# Patient Record
Sex: Male | Born: 1950 | Race: Black or African American | Hispanic: No | Smoking: Current every day smoker
Health system: Southern US, Community
[De-identification: ages and names within clinical notes are randomized; demographics above are authoritative.]

## PROBLEM LIST (undated history)

## (undated) DIAGNOSIS — IMO0001 Reserved for inherently not codable concepts without codable children: Secondary | ICD-10-CM

## (undated) DIAGNOSIS — M549 Dorsalgia, unspecified: Secondary | ICD-10-CM

## (undated) DIAGNOSIS — F17200 Nicotine dependence, unspecified, uncomplicated: Secondary | ICD-10-CM

## (undated) DIAGNOSIS — F1110 Opioid abuse, uncomplicated: Secondary | ICD-10-CM

## (undated) DIAGNOSIS — T783XXA Angioneurotic edema, initial encounter: Secondary | ICD-10-CM

## (undated) DIAGNOSIS — I639 Cerebral infarction, unspecified: Secondary | ICD-10-CM

## (undated) DIAGNOSIS — R519 Headache, unspecified: Secondary | ICD-10-CM

## (undated) DIAGNOSIS — N390 Urinary tract infection, site not specified: Secondary | ICD-10-CM

## (undated) DIAGNOSIS — I10 Essential (primary) hypertension: Secondary | ICD-10-CM

## (undated) DIAGNOSIS — I509 Heart failure, unspecified: Secondary | ICD-10-CM

## (undated) DIAGNOSIS — R51 Headache: Secondary | ICD-10-CM

## (undated) DIAGNOSIS — D496 Neoplasm of unspecified behavior of brain: Secondary | ICD-10-CM

## (undated) DIAGNOSIS — R42 Dizziness and giddiness: Secondary | ICD-10-CM

## (undated) DIAGNOSIS — G459 Transient cerebral ischemic attack, unspecified: Secondary | ICD-10-CM

## (undated) DIAGNOSIS — G8929 Other chronic pain: Secondary | ICD-10-CM

## (undated) DIAGNOSIS — F1020 Alcohol dependence, uncomplicated: Secondary | ICD-10-CM

## (undated) HISTORY — PX: NO PAST SURGERIES: SHX2092

---

## 2000-11-18 ENCOUNTER — Emergency Department (HOSPITAL_COMMUNITY): Admission: EM | Admit: 2000-11-18 | Discharge: 2000-11-18 | Payer: Self-pay | Admitting: Emergency Medicine

## 2001-02-14 ENCOUNTER — Emergency Department (HOSPITAL_COMMUNITY): Admission: EM | Admit: 2001-02-14 | Discharge: 2001-02-14 | Payer: Self-pay | Admitting: Emergency Medicine

## 2002-07-27 ENCOUNTER — Emergency Department (HOSPITAL_COMMUNITY): Admission: EM | Admit: 2002-07-27 | Discharge: 2002-07-27 | Payer: Self-pay | Admitting: Emergency Medicine

## 2002-10-30 ENCOUNTER — Emergency Department (HOSPITAL_COMMUNITY): Admission: EM | Admit: 2002-10-30 | Discharge: 2002-10-30 | Payer: Self-pay | Admitting: Emergency Medicine

## 2003-02-22 ENCOUNTER — Encounter: Payer: Self-pay | Admitting: Emergency Medicine

## 2003-02-22 ENCOUNTER — Inpatient Hospital Stay (HOSPITAL_COMMUNITY): Admission: EM | Admit: 2003-02-22 | Discharge: 2003-02-24 | Payer: Self-pay | Admitting: Emergency Medicine

## 2003-02-22 ENCOUNTER — Encounter: Payer: Self-pay | Admitting: Neurology

## 2003-02-23 ENCOUNTER — Encounter: Payer: Self-pay | Admitting: Cardiology

## 2003-02-23 ENCOUNTER — Encounter: Payer: Self-pay | Admitting: Neurology

## 2003-02-24 ENCOUNTER — Encounter: Payer: Self-pay | Admitting: Neurology

## 2003-08-02 ENCOUNTER — Emergency Department (HOSPITAL_COMMUNITY): Admission: EM | Admit: 2003-08-02 | Discharge: 2003-08-02 | Payer: Self-pay | Admitting: Emergency Medicine

## 2003-08-02 ENCOUNTER — Encounter: Payer: Self-pay | Admitting: Emergency Medicine

## 2003-10-22 ENCOUNTER — Emergency Department (HOSPITAL_COMMUNITY): Admission: EM | Admit: 2003-10-22 | Discharge: 2003-10-22 | Payer: Self-pay | Admitting: Emergency Medicine

## 2003-10-22 ENCOUNTER — Encounter: Payer: Self-pay | Admitting: Emergency Medicine

## 2003-10-24 ENCOUNTER — Inpatient Hospital Stay (HOSPITAL_COMMUNITY): Admission: EM | Admit: 2003-10-24 | Discharge: 2003-10-28 | Payer: Self-pay | Admitting: Emergency Medicine

## 2003-11-30 ENCOUNTER — Encounter: Admission: RE | Admit: 2003-11-30 | Discharge: 2003-11-30 | Payer: Self-pay | Admitting: Gastroenterology

## 2004-04-14 ENCOUNTER — Emergency Department (HOSPITAL_COMMUNITY): Admission: EM | Admit: 2004-04-14 | Discharge: 2004-04-14 | Payer: Self-pay | Admitting: Emergency Medicine

## 2004-05-27 ENCOUNTER — Emergency Department (HOSPITAL_COMMUNITY): Admission: EM | Admit: 2004-05-27 | Discharge: 2004-05-28 | Payer: Self-pay | Admitting: Emergency Medicine

## 2005-01-04 ENCOUNTER — Emergency Department (HOSPITAL_COMMUNITY): Admission: EM | Admit: 2005-01-04 | Discharge: 2005-01-05 | Payer: Self-pay | Admitting: Emergency Medicine

## 2005-02-12 ENCOUNTER — Emergency Department (HOSPITAL_COMMUNITY): Admission: EM | Admit: 2005-02-12 | Discharge: 2005-02-12 | Payer: Self-pay | Admitting: Emergency Medicine

## 2006-10-24 ENCOUNTER — Emergency Department (HOSPITAL_COMMUNITY): Admission: EM | Admit: 2006-10-24 | Discharge: 2006-10-24 | Payer: Self-pay | Admitting: *Deleted

## 2007-07-20 ENCOUNTER — Emergency Department (HOSPITAL_COMMUNITY): Admission: EM | Admit: 2007-07-20 | Discharge: 2007-07-21 | Payer: Self-pay | Admitting: Emergency Medicine

## 2007-07-21 ENCOUNTER — Inpatient Hospital Stay (HOSPITAL_COMMUNITY): Admission: EM | Admit: 2007-07-21 | Discharge: 2007-07-24 | Payer: Self-pay | Admitting: Emergency Medicine

## 2007-08-03 ENCOUNTER — Ambulatory Visit: Payer: Self-pay | Admitting: Family Medicine

## 2007-08-03 LAB — CONVERTED CEMR LAB: Microalb, Ur: 1.94 mg/dL — ABNORMAL HIGH (ref 0.00–1.89)

## 2007-09-16 ENCOUNTER — Emergency Department (HOSPITAL_COMMUNITY): Admission: EM | Admit: 2007-09-16 | Discharge: 2007-09-16 | Payer: Self-pay | Admitting: Emergency Medicine

## 2007-10-16 ENCOUNTER — Emergency Department (HOSPITAL_COMMUNITY): Admission: EM | Admit: 2007-10-16 | Discharge: 2007-10-16 | Payer: Self-pay | Admitting: *Deleted

## 2007-10-21 ENCOUNTER — Ambulatory Visit: Payer: Self-pay | Admitting: Family Medicine

## 2007-10-21 LAB — CONVERTED CEMR LAB
ALT: 53 units/L (ref 0–53)
AST: 43 units/L — ABNORMAL HIGH (ref 0–37)
Alkaline Phosphatase: 71 units/L (ref 39–117)
Basophils Absolute: 0 10*3/uL (ref 0.0–0.1)
Basophils Relative: 0 % (ref 0–1)
Cholesterol: 161 mg/dL (ref 0–200)
Creatinine, Ser: 1.2 mg/dL (ref 0.40–1.50)
Eosinophils Absolute: 0.2 10*3/uL (ref 0.0–0.7)
Eosinophils Relative: 2 % (ref 0–5)
HCT: 44.9 % (ref 39.0–52.0)
Hemoglobin: 14.5 g/dL (ref 13.0–17.0)
MCHC: 32.3 g/dL (ref 30.0–36.0)
Monocytes Absolute: 0.7 10*3/uL (ref 0.2–0.7)
Platelets: 246 10*3/uL (ref 150–400)
RDW: 15.9 % — ABNORMAL HIGH (ref 11.5–14.0)
Sodium: 137 meq/L (ref 135–145)
Total Bilirubin: 0.5 mg/dL (ref 0.3–1.2)
Total Protein: 8.8 g/dL — ABNORMAL HIGH (ref 6.0–8.3)

## 2007-10-22 ENCOUNTER — Ambulatory Visit: Payer: Self-pay | Admitting: *Deleted

## 2007-11-14 ENCOUNTER — Emergency Department (HOSPITAL_COMMUNITY): Admission: EM | Admit: 2007-11-14 | Discharge: 2007-11-14 | Payer: Self-pay | Admitting: Emergency Medicine

## 2007-12-13 ENCOUNTER — Ambulatory Visit: Payer: Self-pay | Admitting: Family Medicine

## 2007-12-30 DIAGNOSIS — IMO0001 Reserved for inherently not codable concepts without codable children: Secondary | ICD-10-CM

## 2007-12-30 HISTORY — DX: Reserved for inherently not codable concepts without codable children: IMO0001

## 2008-01-28 ENCOUNTER — Ambulatory Visit: Payer: Self-pay | Admitting: Family Medicine

## 2008-02-24 ENCOUNTER — Ambulatory Visit: Payer: Self-pay | Admitting: Family Medicine

## 2008-02-24 LAB — CONVERTED CEMR LAB
HDL: 45 mg/dL (ref >39)
LDL Cholesterol: 104 mg/dL — ABNORMAL HIGH (ref 0–99)
Total CHOL/HDL Ratio: 3.9
Triglycerides: 135 mg/dL (ref <150)

## 2008-05-30 ENCOUNTER — Ambulatory Visit: Payer: Self-pay | Admitting: Internal Medicine

## 2008-09-29 ENCOUNTER — Ambulatory Visit: Payer: Self-pay | Admitting: Family Medicine

## 2008-09-30 ENCOUNTER — Encounter (INDEPENDENT_AMBULATORY_CARE_PROVIDER_SITE_OTHER): Payer: Self-pay | Admitting: Family Medicine

## 2008-11-19 ENCOUNTER — Inpatient Hospital Stay (HOSPITAL_COMMUNITY): Admission: EM | Admit: 2008-11-19 | Discharge: 2008-11-20 | Payer: Self-pay | Admitting: *Deleted

## 2008-11-20 ENCOUNTER — Ambulatory Visit: Payer: Self-pay | Admitting: Cardiology

## 2008-11-20 ENCOUNTER — Inpatient Hospital Stay (HOSPITAL_COMMUNITY): Admission: EM | Admit: 2008-11-20 | Discharge: 2008-11-22 | Payer: Self-pay | Admitting: Emergency Medicine

## 2008-11-21 ENCOUNTER — Encounter (INDEPENDENT_AMBULATORY_CARE_PROVIDER_SITE_OTHER): Payer: Self-pay | Admitting: Internal Medicine

## 2008-12-26 ENCOUNTER — Ambulatory Visit: Payer: Self-pay | Admitting: Family Medicine

## 2009-01-15 ENCOUNTER — Emergency Department (HOSPITAL_COMMUNITY): Admission: EM | Admit: 2009-01-15 | Discharge: 2009-01-15 | Payer: Self-pay | Admitting: Emergency Medicine

## 2009-01-19 ENCOUNTER — Emergency Department (HOSPITAL_COMMUNITY): Admission: EM | Admit: 2009-01-19 | Discharge: 2009-01-19 | Payer: Self-pay | Admitting: Emergency Medicine

## 2009-02-06 ENCOUNTER — Ambulatory Visit: Payer: Self-pay | Admitting: Family Medicine

## 2009-03-18 ENCOUNTER — Emergency Department (HOSPITAL_COMMUNITY): Admission: EM | Admit: 2009-03-18 | Discharge: 2009-03-19 | Payer: Self-pay | Admitting: Emergency Medicine

## 2009-04-04 ENCOUNTER — Emergency Department (HOSPITAL_COMMUNITY): Admission: EM | Admit: 2009-04-04 | Discharge: 2009-04-04 | Payer: Self-pay | Admitting: Emergency Medicine

## 2009-05-10 ENCOUNTER — Ambulatory Visit: Payer: Self-pay | Admitting: Family Medicine

## 2009-06-07 ENCOUNTER — Ambulatory Visit: Payer: Self-pay | Admitting: Family Medicine

## 2010-01-22 ENCOUNTER — Ambulatory Visit: Payer: Self-pay | Admitting: Family Medicine

## 2010-06-17 ENCOUNTER — Encounter (INDEPENDENT_AMBULATORY_CARE_PROVIDER_SITE_OTHER): Payer: Self-pay | Admitting: Internal Medicine

## 2010-06-17 ENCOUNTER — Inpatient Hospital Stay (HOSPITAL_COMMUNITY): Admission: EM | Admit: 2010-06-17 | Discharge: 2010-06-19 | Payer: Self-pay | Admitting: Emergency Medicine

## 2010-06-25 ENCOUNTER — Ambulatory Visit: Payer: Self-pay | Admitting: Family Medicine

## 2010-06-25 LAB — CONVERTED CEMR LAB: Sed Rate: 11 mm/hr (ref 0–16)

## 2010-07-23 ENCOUNTER — Ambulatory Visit: Payer: Self-pay | Admitting: Family Medicine

## 2010-10-08 ENCOUNTER — Ambulatory Visit: Payer: Self-pay | Admitting: Family Medicine

## 2010-11-10 ENCOUNTER — Inpatient Hospital Stay (HOSPITAL_COMMUNITY)
Admission: EM | Admit: 2010-11-10 | Discharge: 2010-11-13 | Payer: Self-pay | Source: Home / Self Care | Admitting: Emergency Medicine

## 2011-03-06 ENCOUNTER — Other Ambulatory Visit: Payer: Self-pay | Admitting: Neurosurgery

## 2011-03-06 DIAGNOSIS — D497 Neoplasm of unspecified behavior of endocrine glands and other parts of nervous system: Secondary | ICD-10-CM

## 2011-03-11 LAB — RAPID URINE DRUG SCREEN, HOSP PERFORMED
Amphetamines: NOT DETECTED
Barbiturates: NOT DETECTED
Cocaine: NOT DETECTED
Opiates: NOT DETECTED

## 2011-03-11 LAB — CBC
HCT: 39.3 % (ref 39.0–52.0)
Hemoglobin: 12.5 g/dL — ABNORMAL LOW (ref 13.0–17.0)
Hemoglobin: 13.9 g/dL (ref 13.0–17.0)
MCH: 24 pg — ABNORMAL LOW (ref 26.0–34.0)
MCH: 24.6 pg — ABNORMAL LOW (ref 26.0–34.0)
MCV: 73.3 fL — ABNORMAL LOW (ref 78.0–100.0)
MCV: 75.4 fL — ABNORMAL LOW (ref 78.0–100.0)
Platelets: 258 10*3/uL (ref 150–400)
RBC: 5.21 MIL/uL (ref 4.22–5.81)
RBC: 5.65 MIL/uL (ref 4.22–5.81)

## 2011-03-11 LAB — CK TOTAL AND CKMB (NOT AT ARMC)
CK, MB: 2.4 ng/mL (ref 0.3–4.0)
Total CK: 192 U/L (ref 7–232)

## 2011-03-11 LAB — COMPREHENSIVE METABOLIC PANEL
Alkaline Phosphatase: 58 U/L (ref 39–117)
BUN: 11 mg/dL (ref 6–23)
BUN: 11 mg/dL (ref 6–23)
CO2: 22 mEq/L (ref 19–32)
CO2: 24 mEq/L (ref 19–32)
Chloride: 105 mEq/L (ref 96–112)
Chloride: 105 mEq/L (ref 96–112)
Creatinine, Ser: 0.89 mg/dL (ref 0.4–1.5)
Creatinine, Ser: 1.03 mg/dL (ref 0.4–1.5)
GFR calc non Af Amer: >60 mL/min (ref >60)
GFR calc non Af Amer: >60 mL/min (ref >60)
Glucose, Bld: 158 mg/dL — ABNORMAL HIGH (ref 70–99)
Potassium: 4 mEq/L (ref 3.5–5.1)
Total Bilirubin: 0.2 mg/dL — ABNORMAL LOW (ref 0.3–1.2)
Total Bilirubin: 0.3 mg/dL (ref 0.3–1.2)

## 2011-03-11 LAB — FOLLICLE STIMULATING HORMONE: FSH: 2.9 m[IU]/mL (ref 1.4–18.1)

## 2011-03-11 LAB — GLUCOSE, CAPILLARY
Glucose-Capillary: 126 mg/dL — ABNORMAL HIGH (ref 70–99)
Glucose-Capillary: 127 mg/dL — ABNORMAL HIGH (ref 70–99)
Glucose-Capillary: 135 mg/dL — ABNORMAL HIGH (ref 70–99)
Glucose-Capillary: 140 mg/dL — ABNORMAL HIGH (ref 70–99)
Glucose-Capillary: 145 mg/dL — ABNORMAL HIGH (ref 70–99)
Glucose-Capillary: 145 mg/dL — ABNORMAL HIGH (ref 70–99)
Glucose-Capillary: 158 mg/dL — ABNORMAL HIGH (ref 70–99)
Glucose-Capillary: 208 mg/dL — ABNORMAL HIGH (ref 70–99)

## 2011-03-11 LAB — URINALYSIS, ROUTINE W REFLEX MICROSCOPIC
Hgb urine dipstick: NEGATIVE
Nitrite: NEGATIVE
Specific Gravity, Urine: 1.005 (ref 1.005–1.030)
Urobilinogen, UA: 0.2 mg/dL (ref 0.0–1.0)
pH: 5.5 (ref 5.0–8.0)

## 2011-03-11 LAB — GROWTH HORMONE: Growth Hormone: <0.1 ng/mL (ref 0.00–3.00)

## 2011-03-11 LAB — DIFFERENTIAL
Basophils Absolute: 0 10*3/uL (ref 0.0–0.1)
Eosinophils Absolute: 0.3 10*3/uL (ref 0.0–0.7)
Lymphocytes Relative: 35 % (ref 12–46)
Neutrophils Relative %: 58 % (ref 43–77)

## 2011-03-11 LAB — CARDIAC PANEL(CRET KIN+CKTOT+MB+TROPI)
Relative Index: 0.9 (ref 0.0–2.5)
Relative Index: 1.1 (ref 0.0–2.5)
Total CK: 267 U/L — ABNORMAL HIGH (ref 7–232)
Troponin I: 0.04 ng/mL (ref 0.00–0.06)

## 2011-03-11 LAB — LIPID PANEL
Cholesterol: 152 mg/dL (ref 0–200)
Total CHOL/HDL Ratio: 3.2 RATIO

## 2011-03-11 LAB — FERRITIN: Ferritin: 274 ng/mL (ref 22–322)

## 2011-03-11 LAB — PROTIME-INR: Prothrombin Time: 14.2 seconds (ref 11.6–15.2)

## 2011-03-11 LAB — PROLACTIN: Prolactin: 7.9 ng/mL (ref 2.1–17.1)

## 2011-03-11 LAB — TROPONIN I: Troponin I: 0.01 ng/mL (ref 0.00–0.06)

## 2011-03-11 LAB — SICKLE CELL SCREEN: Sickle Cell Screen: NEGATIVE

## 2011-03-11 LAB — LACTIC ACID, PLASMA: Lactic Acid, Venous: 2 mmol/L (ref 0.5–2.2)

## 2011-03-11 LAB — TSH: TSH: 0.385 u[IU]/mL (ref 0.350–4.500)

## 2011-03-11 LAB — ACTH: C206 ACTH: 28 pg/mL (ref 10–46)

## 2011-03-13 ENCOUNTER — Other Ambulatory Visit: Payer: Self-pay

## 2011-03-16 LAB — RAPID URINE DRUG SCREEN, HOSP PERFORMED
Cocaine: NOT DETECTED
Opiates: POSITIVE — AB
Tetrahydrocannabinol: NOT DETECTED

## 2011-03-16 LAB — PROTEIN ELECTROPH W RFLX QUANT IMMUNOGLOBULINS
Alpha-1-Globulin: 5 % — ABNORMAL HIGH (ref 2.9–4.9)
Alpha-2-Globulin: 11.1 % (ref 7.1–11.8)
Gamma Globulin: 18 % (ref 11.1–18.8)
Total Protein ELP: 7.4 g/dL (ref 6.0–8.3)

## 2011-03-16 LAB — C-REACTIVE PROTEIN: CRP: 0 mg/dL — ABNORMAL LOW (ref <0.6)

## 2011-03-16 LAB — RPR: RPR Ser Ql: NONREACTIVE

## 2011-03-16 LAB — COMPREHENSIVE METABOLIC PANEL
Alkaline Phosphatase: 65 U/L (ref 39–117)
BUN: 8 mg/dL (ref 6–23)
CO2: 24 mEq/L (ref 19–32)
Chloride: 108 mEq/L (ref 96–112)
GFR calc non Af Amer: >60 mL/min (ref >60)
Glucose, Bld: 182 mg/dL — ABNORMAL HIGH (ref 70–99)
Potassium: 4.3 mEq/L (ref 3.5–5.1)
Total Bilirubin: 0.4 mg/dL (ref 0.3–1.2)

## 2011-03-16 LAB — URINALYSIS, ROUTINE W REFLEX MICROSCOPIC
Bilirubin Urine: NEGATIVE
Bilirubin Urine: NEGATIVE
Glucose, UA: NEGATIVE mg/dL
Hgb urine dipstick: NEGATIVE
Nitrite: POSITIVE — AB
Protein, ur: NEGATIVE mg/dL
Specific Gravity, Urine: 1.016 (ref 1.005–1.030)
Urobilinogen, UA: 1 mg/dL (ref 0.0–1.0)
Urobilinogen, UA: 1 mg/dL (ref 0.0–1.0)
pH: 6 (ref 5.0–8.0)

## 2011-03-16 LAB — GLUCOSE, CAPILLARY
Glucose-Capillary: 144 mg/dL — ABNORMAL HIGH (ref 70–99)
Glucose-Capillary: 149 mg/dL — ABNORMAL HIGH (ref 70–99)
Glucose-Capillary: 175 mg/dL — ABNORMAL HIGH (ref 70–99)
Glucose-Capillary: 216 mg/dL — ABNORMAL HIGH (ref 70–99)

## 2011-03-16 LAB — CBC
HCT: 43.5 % (ref 39.0–52.0)
HCT: 45.4 % (ref 39.0–52.0)
Hemoglobin: 14.7 g/dL (ref 13.0–17.0)
MCHC: 32.5 g/dL (ref 30.0–36.0)
MCV: 77.2 fL — ABNORMAL LOW (ref 78.0–100.0)
RBC: 5.64 MIL/uL (ref 4.22–5.81)
RDW: 14 % (ref 11.5–15.5)
WBC: 9 10*3/uL (ref 4.0–10.5)

## 2011-03-16 LAB — LIPID PANEL
Cholesterol: 140 mg/dL (ref 0–200)
HDL: 35 mg/dL — ABNORMAL LOW (ref >39)
Total CHOL/HDL Ratio: 4 RATIO
Triglycerides: 199 mg/dL — ABNORMAL HIGH (ref <150)

## 2011-03-16 LAB — HEMOGLOBIN A1C: Hgb A1c MFr Bld: 7.4 % — ABNORMAL HIGH (ref <5.7)

## 2011-03-16 LAB — TSH: TSH: 0.683 u[IU]/mL (ref 0.350–4.500)

## 2011-03-16 LAB — URINE MICROSCOPIC-ADD ON

## 2011-03-16 LAB — URINE CULTURE

## 2011-03-16 LAB — DIFFERENTIAL
Basophils Absolute: 0.1 10*3/uL (ref 0.0–0.1)
Basophils Relative: 1 % (ref 0–1)
Eosinophils Absolute: 0.3 10*3/uL (ref 0.0–0.7)
Eosinophils Relative: 3 % (ref 0–5)
Lymphs Abs: 4 10*3/uL (ref 0.7–4.0)
Monocytes Relative: 7 % (ref 3–12)
Neutro Abs: 9 10*3/uL — ABNORMAL HIGH (ref 1.7–7.7)
Neutrophils Relative %: 65 % (ref 43–77)

## 2011-03-16 LAB — VITAMIN B12: Vitamin B-12: 764 pg/mL (ref 211–911)

## 2011-03-16 LAB — IGG, IGA, IGM: IgG (Immunoglobin G), Serum: 1340 mg/dL (ref 694–1618)

## 2011-03-16 LAB — ANA: Anti Nuclear Antibody(ANA): NEGATIVE

## 2011-03-16 LAB — APTT: aPTT: 32 seconds (ref 24–37)

## 2011-03-16 LAB — FOLATE RBC: RBC Folate: 512 ng/mL (ref 180–600)

## 2011-03-16 LAB — HEPATITIS PANEL, ACUTE: HCV Ab: REACTIVE — AB

## 2011-03-16 LAB — PROTIME-INR: INR: 1.04 (ref 0.00–1.49)

## 2011-03-16 LAB — CK TOTAL AND CKMB (NOT AT ARMC): CK, MB: 1.3 ng/mL (ref 0.3–4.0)

## 2011-03-25 ENCOUNTER — Ambulatory Visit
Admission: RE | Admit: 2011-03-25 | Discharge: 2011-03-25 | Disposition: A | Discharge status: Home / Self Care | Payer: Medicaid Other | Source: Ambulatory Visit | Attending: Neurosurgery | Admitting: Neurosurgery

## 2011-03-25 DIAGNOSIS — D497 Neoplasm of unspecified behavior of endocrine glands and other parts of nervous system: Secondary | ICD-10-CM

## 2011-03-25 MED ORDER — GADOBENATE DIMEGLUMINE 529 MG/ML IV SOLN
10.0000 mL | Freq: Once | INTRAVENOUS | Status: AC | PRN
  Administered 2011-03-25: 10 mL via INTRAVENOUS

## 2011-04-07 ENCOUNTER — Emergency Department (HOSPITAL_COMMUNITY)
Admission: EM | Admit: 2011-04-07 | Discharge: 2011-04-07 | Disposition: A | Discharge status: Home / Self Care | Payer: Medicaid Other | Attending: Emergency Medicine | Admitting: Emergency Medicine

## 2011-04-07 DIAGNOSIS — E119 Type 2 diabetes mellitus without complications: Secondary | ICD-10-CM | POA: Insufficient documentation

## 2011-04-07 DIAGNOSIS — J3489 Other specified disorders of nose and nasal sinuses: Secondary | ICD-10-CM | POA: Insufficient documentation

## 2011-04-07 DIAGNOSIS — Z79899 Other long term (current) drug therapy: Secondary | ICD-10-CM | POA: Insufficient documentation

## 2011-04-07 DIAGNOSIS — R51 Headache: Secondary | ICD-10-CM | POA: Insufficient documentation

## 2011-04-07 DIAGNOSIS — Z8673 Personal history of transient ischemic attack (TIA), and cerebral infarction without residual deficits: Secondary | ICD-10-CM | POA: Insufficient documentation

## 2011-04-07 DIAGNOSIS — J329 Chronic sinusitis, unspecified: Secondary | ICD-10-CM | POA: Insufficient documentation

## 2011-04-07 DIAGNOSIS — I10 Essential (primary) hypertension: Secondary | ICD-10-CM | POA: Insufficient documentation

## 2011-04-07 DIAGNOSIS — H538 Other visual disturbances: Secondary | ICD-10-CM | POA: Insufficient documentation

## 2011-04-09 LAB — POCT I-STAT, CHEM 8
Creatinine, Ser: 1 mg/dL (ref 0.4–1.5)
Glucose, Bld: 123 mg/dL — ABNORMAL HIGH (ref 70–99)
Hemoglobin: 16.3 g/dL (ref 13.0–17.0)
TCO2: 21 mmol/L (ref 0–100)

## 2011-04-10 ENCOUNTER — Other Ambulatory Visit: Payer: Self-pay | Admitting: Neurosurgery

## 2011-04-10 DIAGNOSIS — R911 Solitary pulmonary nodule: Secondary | ICD-10-CM

## 2011-04-10 LAB — POCT I-STAT, CHEM 8
Calcium, Ion: 1.09 mmol/L — ABNORMAL LOW (ref 1.12–1.32)
Chloride: 112 mEq/L (ref 96–112)
Glucose, Bld: 117 mg/dL — ABNORMAL HIGH (ref 70–99)
HCT: 47 % (ref 39.0–52.0)
Hemoglobin: 16 g/dL (ref 13.0–17.0)

## 2011-04-10 LAB — POCT CARDIAC MARKERS
CKMB, poc: <1 ng/mL — ABNORMAL LOW (ref 1.0–8.0)
Myoglobin, poc: 41.8 ng/mL (ref 12–200)
Troponin i, poc: <0.05 ng/mL (ref 0.00–0.09)

## 2011-04-14 ENCOUNTER — Other Ambulatory Visit: Payer: Medicaid Other

## 2011-04-14 LAB — GLUCOSE, CAPILLARY: Glucose-Capillary: 116 mg/dL — ABNORMAL HIGH (ref 70–99)

## 2011-04-14 LAB — POCT I-STAT, CHEM 8
Hemoglobin: 16.3 g/dL (ref 13.0–17.0)
Sodium: 142 mEq/L (ref 135–145)
TCO2: 22 mmol/L (ref 0–100)

## 2011-04-14 LAB — ETHANOL: Alcohol, Ethyl (B): 11 mg/dL — ABNORMAL HIGH (ref 0–10)

## 2011-04-16 ENCOUNTER — Ambulatory Visit
Admission: RE | Admit: 2011-04-16 | Discharge: 2011-04-16 | Disposition: A | Discharge status: Home / Self Care | Payer: Medicaid Other | Source: Ambulatory Visit | Attending: Neurosurgery | Admitting: Neurosurgery

## 2011-04-16 DIAGNOSIS — R911 Solitary pulmonary nodule: Secondary | ICD-10-CM

## 2011-04-16 MED ORDER — IOHEXOL 300 MG/ML  SOLN
75.0000 mL | Freq: Once | INTRAMUSCULAR | Status: AC | PRN
  Administered 2011-04-16: 75 mL via INTRAVENOUS

## 2011-05-01 ENCOUNTER — Other Ambulatory Visit: Payer: Self-pay | Admitting: Neurosurgery

## 2011-05-01 DIAGNOSIS — Z86018 Personal history of other benign neoplasm: Secondary | ICD-10-CM

## 2011-05-08 ENCOUNTER — Emergency Department (HOSPITAL_COMMUNITY): Payer: Medicaid Other

## 2011-05-08 ENCOUNTER — Emergency Department (HOSPITAL_COMMUNITY)
Admission: EM | Admit: 2011-05-08 | Discharge: 2011-05-08 | Disposition: A | Discharge status: Home / Self Care | Payer: Medicaid Other | Attending: Emergency Medicine | Admitting: Emergency Medicine

## 2011-05-08 DIAGNOSIS — Z8673 Personal history of transient ischemic attack (TIA), and cerebral infarction without residual deficits: Secondary | ICD-10-CM | POA: Insufficient documentation

## 2011-05-08 DIAGNOSIS — I10 Essential (primary) hypertension: Secondary | ICD-10-CM | POA: Insufficient documentation

## 2011-05-08 DIAGNOSIS — E119 Type 2 diabetes mellitus without complications: Secondary | ICD-10-CM | POA: Insufficient documentation

## 2011-05-08 DIAGNOSIS — R509 Fever, unspecified: Secondary | ICD-10-CM | POA: Insufficient documentation

## 2011-05-08 DIAGNOSIS — B9789 Other viral agents as the cause of diseases classified elsewhere: Secondary | ICD-10-CM | POA: Insufficient documentation

## 2011-05-08 DIAGNOSIS — N342 Other urethritis: Secondary | ICD-10-CM | POA: Insufficient documentation

## 2011-05-08 LAB — DIFFERENTIAL
Basophils Absolute: 0 10*3/uL (ref 0.0–0.1)
Lymphocytes Relative: 9 % — ABNORMAL LOW (ref 12–46)
Monocytes Absolute: 0 10*3/uL — ABNORMAL LOW (ref 0.1–1.0)
Monocytes Relative: 0 % — ABNORMAL LOW (ref 3–12)
Neutro Abs: 10.4 10*3/uL — ABNORMAL HIGH (ref 1.7–7.7)
Neutrophils Relative %: 90 % — ABNORMAL HIGH (ref 43–77)

## 2011-05-08 LAB — COMPREHENSIVE METABOLIC PANEL
ALT: 55 U/L — ABNORMAL HIGH (ref 0–53)
AST: 74 U/L — ABNORMAL HIGH (ref 0–37)
Alkaline Phosphatase: 53 U/L (ref 39–117)
CO2: 21 mEq/L (ref 19–32)
Calcium: 9.3 mg/dL (ref 8.4–10.5)
Chloride: 99 mEq/L (ref 96–112)
GFR calc non Af Amer: >60 mL/min (ref >60)
Glucose, Bld: 109 mg/dL — ABNORMAL HIGH (ref 70–99)
Sodium: 132 mEq/L — ABNORMAL LOW (ref 135–145)
Total Bilirubin: 0.4 mg/dL (ref 0.3–1.2)

## 2011-05-08 LAB — CBC
HCT: 44.2 % (ref 39.0–52.0)
Hemoglobin: 14.2 g/dL (ref 13.0–17.0)
MCHC: 32.1 g/dL (ref 30.0–36.0)
RBC: 5.84 MIL/uL — ABNORMAL HIGH (ref 4.22–5.81)

## 2011-05-08 LAB — URINALYSIS, ROUTINE W REFLEX MICROSCOPIC
Bilirubin Urine: NEGATIVE
Glucose, UA: NEGATIVE mg/dL
Hgb urine dipstick: NEGATIVE
Protein, ur: NEGATIVE mg/dL
Urobilinogen, UA: 1 mg/dL (ref 0.0–1.0)

## 2011-05-08 LAB — LIPASE, BLOOD: Lipase: 32 U/L (ref 11–59)

## 2011-05-08 LAB — URINE MICROSCOPIC-ADD ON

## 2011-05-09 LAB — URINE CULTURE: Colony Count: >=100000

## 2011-05-13 NOTE — H&P (Signed)
[name], [name]               ACCOUNT NO.:  [id number]   MEDICAL RECORD NO.:  [id number]          PATIENT TYPE:  INP   LOCATION:  3731                         FACILITY:  [facility]   PHYSICIAN:  Wilson Singer, M.D.DATE OF BIRTH:  [birth year]   DATE OF ADMISSION:  11/19/2008  DATE OF DISCHARGE:                              HISTORY & PHYSICAL   This 60 year old man describes an approximately 3-day history of a sore  throat associated with a cough, which is largely nonproductive and the  cough is associated with a sharp anterior upper chest pain.  The pain  does not particularly radiate significantly.  He has not had any  significant wheezing.  There is no fever.  He came into the emergency  room today because of these symptoms and the worry is whether the chest  pain is of any significance.  He has had workup so far, which includes a  CT angiogram to rule out a pulmonary embolism.  There is no pulmonary  embolism, but comment is made on coronary arteries which are calcified.   PAST MEDICAL HISTORY:  Significant for diabetes and hypertension.   There is no history of major surgeries.   ALLERGIES:  No known drug allergies.   CURRENT MEDICATIONS:  1. Metformin 500 mg daily.  2. Aspirin 1 tablet daily.  3. Antihypertensive medication, the name of which he is not aware of.   FAMILY HISTORY:  He told that his father died of a myocardial infarction  in his 69s.   REVIEW OF SYSTEMS:  Apart from the symptoms mentioned above, there are  no other symptoms referable to all systems reviewed.   PHYSICAL EXAMINATION:  VITAL SIGNS:  Temperature 98.1, blood pressure  119/64, pulse 86, saturation 98% on room air, and respiratory rate 12-  14.  GENERAL:  The patient does not appear to be in any discomfort.  CARDIOVASCULAR:  Heart sounds are present and normal without any gallop  rhythm.  There are no rubs.  Jugular venous pressure is not raised.  There are no carotid bruits.  RESPIRATORY:   Lungs fields are entirely clear.  CHEST WALL:  There is tenderness in the anterior chest wall reproducing  the pain that he describes.  ABDOMEN:  Soft and nontender with no hepatosplenomegaly.  NEUROLOGIC:  He is alert and oriented without any focal neurological  signs.   INVESTIGATIONS:  Hemoglobin 13.9, white blood cell count 13.0, and  platelets 252.  Initial troponin is less than 0.05.  Sodium 137,  potassium 4.2, bicarbonate 21, glucose 108, BUN 21, and creatinine 1.05.  Lipase 27, AST and ALT are moderately elevated, and albumin 3.9.  Chest  x-ray shows low lung volumes with mid mild left base airspace disease,  which is favorable for atelectasis.  There is bronchial thickening,  which may relate to chronic bronchitis.  CT angio of the chest, as  mentioned above with evidence of calcified coronary arteries, but no  pulmonary embolism.   IMPRESSION:  1. Bronchitis.  2. Atypical sharp chest pain which is likely musculoskeletal in      nature.  3.  Significant risk factors for coronary artery disease.  4. Hypertension.  5. Type 2 diabetes mellitus.  6. Tobacco abuse in a previous history from e-chart records of cocaine      abuse.   PLAN:  1. Admit to telemetry.  2. Antibiotics and steroids for bronchitis.  3. Serial cardiac enzymes and electrocardiograms.  4. Urine drug screen  5. Consider Cardiology consultation for an outpatient stress test if      the enzymes are negative.   Further recommendations will depend on the patient's hospital progress.      Wilson Singer, M.D.  Electronically Signed     NCG/MEDQ  D:  11/19/2008  T:  11/19/2008  Job:  [id number]

## 2011-05-13 NOTE — H&P (Signed)
NAMEKARAS, PICKERILL NO.:  [id number]   MEDICAL RECORD NO.:  [id number]          PATIENT TYPE:  INP   LOCATION:  1844                         FACILITY:  [facility]   PHYSICIAN:  Lucita Ferrara, MD         DATE OF BIRTH:  [birth year]   DATE OF ADMISSION:  11/20/2008  DATE OF DISCHARGE:                              HISTORY & PHYSICAL   CHIEF COMPLAINT:  Chest pain that is recurrent.   HISTORY OF PRESENT ILLNESS:  The patient is a 60 year old African  American male who now presents back to the hospital 1 day prior to  signing out against medical advice from [facility].  The  patient was initially admitted November 19, 2008 with atypical type of  chest pain and presumptive bronchitis.  He had a set of cardiac enzymes  that were essentially negative.  Chest pain is much similar to prior  presentation, located anterior upper chest area, sharp in description  and 10/10 intensity not associated with exertion.  There is no  association with gastroesophageal reflux disease or p.o. intake.  There  is no wheezing, fevers, chills or sick contacts.  The patient apparently  had a CT angiography to rule out pulmonary embolism which was apparently  negative at that time except showed some calcifications.   PAST MEDICAL HISTORY AND RISK FACTORS:  Include:  1. Diabetes.  2. Hypertension.  3. Recurrent chest pain.  4. No major surgery.   ALLERGIES:  No known drug allergies.   CURRENT MEDICATIONS:  1. Metformin 500 mg p.o. daily.  2. Aspirin 1 tablet p.o. daily.  3. Antihypertensive medication which he does not remember.   FAMILY HISTORY:  Father died of a myocardial infarction at age 87.   REVIEW OF SYSTEMS:  As per HPI, otherwise negative.   PHYSICAL EXAMINATION:  Generally speaking, the patient is in no acute  distress.  Blood pressure is 159/84, pulse 89, respirations 18,  temperature 98.4, pulse ox 96% on room air.  HEENT: Normocephalic, atraumatic.  Sclerae is  anicteric.  NECK:  Is supple.  No JVD, no carotid bruits.  Extraocular muscles  intact.  CARDIOVASCULAR:  S1, S2.  Regular rate, rhythm.  No murmurs, rubs,  clicks.  LUNGS:  Clear to auscultation bilaterally with no rhonchi, rales or  wheezes.  There is some tenderness left anterior chest area that is  reproducible.  ABDOMEN:  Soft, nontender, nondistended.  Positive bowel  sounds.  NEURO:  Patient is oriented x3.  Cranial nerves II-XII grossly intact.   LABORATORY DATA:  Cardiac markers x2 essentially negative except with  high myoglobin of greater than 500 on the second set.  Lipid profile  shows a high triglyceride level but a normal LDL level.  CBC shows a  white count of 24.5 and a hemoglobin of 14.1, hematocrit 42.6 and  platelet count of 249.  I-stat within normal limits.   RADIOLOGICAL DATA:  Chest x-ray shows mild peribronchial thickening, no  pneumonia or effusion.   ASSESSMENT/PLAN:  [age]-year-old with atypical type of chest pain,  associated with cough and  pleuritis, sharp in nature and reproducible.  Worse upon inspiration.   PLAN:  1. Even-though the patient's atypical symptoms, the patient will be      admitted to the medical telemetry unit.  He does have moderate risk      factors due to early myocardial infarction in father at a young      age.  2. Leukocytosis. Plan would be to get a set of blood cultures x2,      sputum culture and empirically start the patient on Levaquin.  3. Diabetes type 2.  We will check hemoglobin A1c, initiate sliding      scale insulin and continue his metformin.  4. Moderately controlled hypertension.  W will continue lisinopril and      add low-dose beta blocker with metoprolol 12.5 mg twice daily.  We      will check a urine drug screen for cocaine and if it is positive,      we will need to discontinue the metoprolol.  5. Noncompliance with medical care signing out against medical advice      from the hospital.  6. Continued  tobacco abuse.  7. Isolated hypertriglyceridemia.  We will initiate nicotine patch,      literature on smoking cessation.  We will assert the need to quit.      We will continue risk factor modification.  We will assert the      importance of staying in the hospital until finalized workup and      Cardiology consultation have been made.  I do believe that the      patient has moderately high risk factor and will likely benefit      from a stress test prior to leaving the hospital.  As far as      leukocytosis, this needs to be worked up.  It does not look like      patient has been on any steroids recently, although we will get the      previous chart to rule that out.      Lucita Ferrara, MD  Electronically Signed     RR/MEDQ  D:  11/20/2008  T:  11/20/2008  Job:  [id number]

## 2011-05-13 NOTE — H&P (Signed)
[name], [name]               ACCOUNT NO.:  [id number]   MEDICAL RECORD NO.:  [id number]          PATIENT TYPE:  INP   LOCATION:  1443                         FACILITY:  [facility]   PHYSICIAN:  Beckey Rutter, MD  DATE OF BIRTH:  08/10/1952   DATE OF ADMISSION:  07/21/2007  DATE OF DISCHARGE:                              HISTORY & PHYSICAL   PRIMARY CARE PHYSICIAN:  The patient is unassigned to [facility].   CHIEF COMPLAINT:  Hyperglycemia and decreased level of consciousness.   HISTORY OF PRESENT ILLNESS:  This is a 60 year old African American male  with past medical history significant for the gastrointestinal bleeding  secondary to diverticulitis.  He was in his usual state of health up to  yesterday when he felt overall tired and felt generalized pain.  He  visited the ER where they discovered the patient had hyperglycemia, and  he was started on anti-diabetic medication to follow up with his  primary.  The patient went home, and since last night, his condition was  not improving.  He stated that he did not take the medication prescribed  to him by the ER.  Now he is coming back with abdominal discomfort and  overall being too tired to answer or engage in conversation.  When I  asked him, while taking history, he stated that he has abdominal  discomfort.  He did not get to eat anything since yesterday because of  his overall condition of being tired and with the mild abdominal  discomfort.   PAST MEDICAL HISTORY:  Significant for:  1. GI bleeding secondary to colonic diverticulitis in 07/2003.  2. Newly diagnosed diabetes.   DRUG ALLERGIES:  HE IS NOT KNOWN TO HAVE DRUG ALLERGIES.   MEDICATIONS:  He is not taking any medications, though yesterday he was  prescribed medication for diabetes.   SOCIAL HISTORY:  He is a current smoker, occasional drinker.  He has a  history of cocaine abuse in the past but denied cocaine abuse or drug  abuse recently.  He lives now with  his girlfriend.   REVIEW OF SYSTEMS:  As per his girlfriend who is at bedside.  The  patient was complaining of chills and was complaining of greenish sputum  with no significant cough.   PHYSICAL EXAMINATION:  VITALS:  Temperature 98.7, blood pressure 150/69,  pulse 78, respiratory rate 20.  GENERAL:  He was lying in bed.  He looks ill, but there is no  respiratory distress.  HEAD:  Atraumatic, normocephalic.  EYES:  PERRL.  MOUTH:  No ulcer, dry lips.  NECK:  Supple.  No JVD.  LUNGS:  He has right middle lobe crepitation on auscultation.  PRECORDIAL:  First and second heart sounds audible, no added sounds.  ABDOMEN:  Soft, nontender.  Bowel sounds present.  GU:  No CVA tenderness.  NEUROLOGICALLY:  Patient answered questions appropriately, but he seems  to seek to be fluent; most of the time need to repeat the question  twice.  He seems to be moving all his extremities spontaneously.   LABS AND X-RAY:  Urine for  tricyclic is negative.  Lactic acid is 1.3.  Drug screen is positive only for opiates.  Aspirin is less than 4.  Alcohol was less than five.  Acetaminophen in the blood is less than 10.  D-dimer is not elevated at 0.47.  Ketones are negative.  His sodium is  137, potassium 4.6, chloride 101, bicarb 22, glucose 327, BUN 113,  creatinine 1.02.  White blood count elevated at 13.3 with 80%  neutrophils.  Hemoglobin 14.9, hematocrit 46.3, platelet is 222.  Microscopic urine showing white blood count by high-power field 7-10.  Urinalysis showing negative nitrates and a small amount of leukocyte  esterase.  His ABG showing pH of 7.43, pCO2 of 33.4, pO2 is 54.4.  Chest  x-ray reading is low lung volumes with bibasilar atelectasis and mild  chronic bronchitic change.  EKG is sinus at rate of 80.   ASSESSMENT AND PLAN:  This is a 60 year old male with uncontrolled  diabetes who presented today with generalized pain, fevers and cough;  also was found to have elevated white blood  count in the urine, an  indication of UTI.   The patient will be admitted for further assessment and management.  I  will start the patient on antibiotics to cover the UTI as well as the  lung finding which could be a bronchitic change/bronchitis picture  versus pneumonia.  We will obtain a good chest x-ray with PA and lateral  views.  The patient will be hydrated with IV fluids.  He has mild  tenderness on his abdominal exam and also with mild rebound tenderness.  I will consider CT abdomen today.  For DVT prophylaxis, I will consider  Lovenox for GI prophylaxis consider Protonix IV for now to be switched  to p.o. in the morning if the patient started to tolerate food.      Beckey Rutter, MD  Electronically Signed     EME/MEDQ  D:  07/21/2007  T:  07/22/2007  Job:  [id number]

## 2011-05-13 NOTE — Discharge Summary (Signed)
[name], [name] NO.:  [id number]   MEDICAL RECORD NO.:  [id number]          PATIENT TYPE:  INP   LOCATION:  3706                         FACILITY:  [facility]   PHYSICIAN:  Peggye Pitt, M.D. DATE OF BIRTH:  04-21-1951   DATE OF ADMISSION:  11/20/2008  DATE OF DISCHARGE:  11/22/2008                               DISCHARGE SUMMARY   DISCHARGE DIAGNOSES:  1. Chest pain, rule out for acute myocardial infarction.  2. Hypertension.  3. Type 2 diabetes.  4. Tobacco abuse.   DISCHARGE MEDICATIONS:  1. Metformin 500 mg p.o. daily.  2. Lisinopril 5 mg daily.  3. Aspirin 81 mg daily.   DISPOSITION AND FOLLOWUP:  The patient is discharged home in stable  condition.  He is no longer experiencing any chest pain.  He has been  advised to make an appointment at [facility] within 2 weeks of this  discharge, for follow up of his regular medical problems.   CONSULTATIONS:  Consultations during this hospitalization include  [facility] for a Myoview stress test.   Images during this hospitalization include a chest x-ray on November 20, 2008, that showed some mild peribronchial thickening with no pneumonia  or effusion; a CT angiogram of his chest that showed no pulmonary  embolus with no acute disease in the chest with coronary artery disease  and atherosclerosis; a Myoview on November 22, 2008, that showed an  ejection fraction of 71% and was negative for exercise stress-induced  ischemia.   HISTORY AND PHYSICAL:  For full details, please refer to history and  physical dictated by Dr. Flonnie Overman on date November 20, 2008, but in brief,  [name] is a 60 year old African-American man who presented with  chest pain.  He had left against medical advice the day before for same  reasons and came back because of his continued pain.  Chest pain was  located in the upper chest area, not associated with exertion.  We were  called for admission for further evaluation of  his chest pain.   HOSPITAL COURSE BY ACTIVE PROBLEMS:  1. Chest pain:  Because of his risk factors and coronary      atherosclerosis seen on the CT scan of his chest, we asked      Cardiology to perform a nuclear medicine stress test, which was      negative for exercise-induced ischemia with a good ejection      fraction of 71%.  He ruled out for an MI with 3 sets of negative      enzymes.  2. Hypertension:  He maintained excellent blood pressure control in      the hospital while on lisinopril.  3. For his type 2 diabetes, good CBG control while on metformin.  4. Tobacco abuse:  He was maintained on a nicotine patch while in the      hospital.  We have encouraged smoking cessation, but he has denied      a prescription for nicotine before his discharge.   VITAL SIGNS UPON DISCHARGE:  Blood pressure 129/83, heart rate 75,  respirations 20, O2  sats 95% on room air with a temperature of 98.7.   LABORATORY DATA ON DAY OF DISCHARGE:  Sodium 136, potassium 3.7,  chloride 104, bicarb 24, BUN 13, creatinine 0.96 with a glucose of 117.  WBCs 13.7, hemoglobin 13.1, platelets of 219.  His troponins have been  negative x3.      Peggye Pitt, M.D.  Electronically Signed     EH/MEDQ  D:  11/22/2008  T:  11/23/2008  Job:  [id number]   cc:   Dr. Reche Dixon

## 2011-05-13 NOTE — Discharge Summary (Signed)
NAMEOZZIE, KNOBEL NO.:  [id number]   MEDICAL RECORD NO.:  [id number]          PATIENT TYPE:  INP   LOCATION:  3738                         FACILITY:  [facility]   PHYSICIAN:  Theodosia Paling, MD    DATE OF BIRTH:  [birth year]   DATE OF ADMISSION:  11/19/2008  DATE OF DISCHARGE:  11/20/2008                               DISCHARGE SUMMARY   Please refer to the excellent admission note dictated by Dr. Lilly Cove on November 19, 2008.   HOSPITAL COURSE:  The patient was admitted to rule out MI, however, the  patient left against medical advice before I could evaluate him on  November 20, 2008.  By the time I reached, the patient already left the  floor, so was not evaluated by me.      Theodosia Paling, MD  Electronically Signed     Theodosia Paling, MD  Electronically Signed    NP/MEDQ  D:  12/25/2008  T:  12/26/2008  Job:  [id number]

## 2011-05-13 NOTE — Discharge Summary (Signed)
[name], [name]               ACCOUNT NO.:  [id number]   MEDICAL RECORD NO.:  [id number]          PATIENT TYPE:  INP   LOCATION:  1443                         FACILITY:  [facility]   PHYSICIAN:  Beckey Rutter, MD  DATE OF BIRTH:  08/10/1952   DATE OF ADMISSION:  07/21/2007  DATE OF DISCHARGE:  07/24/2007                               DISCHARGE SUMMARY   PRIMARY CARE PHYSICIAN:  The patient belongs to [facility].  He is  unassigned to [facility].   CHIEF COMPLIANT ON PRESENTATION:  Abdominal pain, nausea and vomiting.   HISTORY OF PRESENT ILLNESS:  The patient was found to have uncontrolled  diabetes, symptomatic with nausea and vomiting, and abdominal  discomfort, but he was not acidotic, and there was no evidence of DKA.   HOSPITAL COURSE:  During hospital, the patient was continued on a  sliding scale and started on glipizide and metformin with overall  improvement of his condition.  The patient has newly diagnosed diabetes,  and he was not taking medication prior to this hospitalization.  He is  stable today for discharge to continue on metformin 500 mg twice a day  and glipizide 10 mg p.o. daily.  The patient had diabetic nurse educator  visit, and he was provided with the material regarding diabetes.  The  idea of followup with a primary physician for further adjustment and  control of his diabetes as well as primary prevention of diabetic side  effects was stressed to him, and he is agreeable to the follow-up plans.   DISCHARGE DIAGNOSES:  1. Hyperglycemia secondary to new onset diabetes.  2. Abdominal discomfort, likely secondary to #1.  3. Protein calorie malnutrition with prealbumin of 9.3.   DISCHARGE MEDICATIONS:  1. Glipizide 10 mg p.o. daily  2. Metformin 500 mg p.o. b.i.d.  3. Glucometer.   PROCEDURE:  1. CT of abdomen on the presentation day with impression reading:      a.     No acute intra-abdominal findings.      b.     Stable left hepatic lobe  cyst.      c.     _  sclerosis.  2. Chest x-ray was done on the day of admission.  Impression:  Left      lower lobe air space persists, improved.   Note:  The patient also was noticed to have the finding of bronchitis  during hospitalization, he was kept on Levaquin 500 mg IV every 24  hours, and he received two doses so far with today's dose going to be  three doses.  I will prescribe for more Levaquin to be continued on p.o.      Beckey Rutter, MD  Electronically Signed     EME/MEDQ  D:  07/24/2007  T:  07/25/2007  Job:  [id number]

## 2011-05-16 NOTE — Discharge Summary (Signed)
NAME:  [name], [name]                         ACCOUNT NO.:  [id number]   MEDICAL RECORD NO.:  [id number]                   PATIENT TYPE:  INP   LOCATION:  0353                                 FACILITY:  [facility]   PHYSICIAN:  Lorelle Formosa, M.D.           DATE OF BIRTH:  08/10/1952   DATE OF ADMISSION:  10/24/2003  DATE OF DISCHARGE:  10/28/2003                                 DISCHARGE SUMMARY   ADMISSION DIAGNOSIS:  Gastrointestinal bleed.   DISCHARGE DIAGNOSIS:  Gastrointestinal bleed secondary to colonic  diverticulitis.   DISCHARGE CONDITION:  Stable.   DISCHARGE DISPOSITION:  Follow up in the office in approximately one to two  weeks. The patient will be seen by Dr. Vida Rigger in approximately one to  weeks.   DISCHARGE MEDICATIONS:  Amoxicillin 500 mg t.i.d.   CONSULTANTS:  Dr. Vida Rigger, gastroenterologist.   HISTORY:  This patient is a 60 year old black male who presented to the  emergency department with onset of several episodes of rectal bleeding as  initial occurrence. He was evaluated in the emergency department and given  IV fluids for hypotension. His blood pressure dropped to approximately 70/42  and two liters of IV normal saline was given.  The patient's CBC revealed  white count of 14.9 with hemoglobin of 13.3.  Hematocrit was 40.5 and  platelet count 282,000. He remained orthostatically symptomatic after IV  fluids of 2 liters and thus was referred for admission.   PAST MEDICAL HISTORY, FAMILY HISTORY, PERSONAL HISTORY, SOCIAL HISTORY, AND  REVIEW OF SYSTEMS:  Outlined in the admission history and physical.   PHYSICAL EXAMINATION:  VITAL SIGNS: His blood pressure was 109/71, pulse  118, respirations 20, and temperature 99.1.  The patient appeared to be a  healthy black male in no acute distress.  HEENT:  Revealed PERRLA. EOMs were intact. Mouth exam revealed no lesions  with moist mucous membranes.  NECK: Supple with no jugular venous distention,  bruits, nodes, or  thyromegaly.  CHEST : Clear to auscultation. Heart had a regular rate and rhythm without  murmur.  ABDOMEN: Soft and flat.  There was tenderness to palpation in the  hypogastric region.  GENITALIA: Revealed bilaterally descended testes with uncircumcised penis.  EXTREMITIES:  Extremities were normal  SKIN:  Revealed no lesion.  NEUROLOGIC:  Grossly physiologic except for some weakness.   LABORATORY DATA:  Initial CBC revealed white count of 14.9 with hemoglobin  of 13.3, hematocrit 40.5, and platelet count 282,000.  CBC prior to  discharged revealed white count of 8.2 with hemoglobin of 9.8, hematocrit  30.5, platelet count 264,000.  White cell differential revealed 85  neutrophils and 7 lymphocytes. PT 32.5, PTT 38, and INR 1.  Chemistries were  normal except for glucose initially 129 with correct to 89. Urinalysis was  normal. Urine culture had no growth. CT of the abdomen with contrast  revealed descending colon diverticulitis with hypodense lesions  in the left  hepatic lobe. This was evaluated with ultrasound which revealed only cyst.  CT of the pelvis without contrast revealed thick wall urinary bladder with a  left-sided ploule.   HOSPITAL COURSE:  The patient was treated with IV fluids of normal saline  and had blood type crossmatched, but this was not needed. He was given  Protonix IV and symptomatic meds. He was initially seen by the  gastroenterologist and begun on Zosyn 3.75 gram IV q.6h. This was not known  by primary care physician who added amoxicillin 500 mg q.8h. on October 25, 2003.  This was subsequently discontinued.  The patient is currently stable  and has had cessation of rectal bleeding with progression back to a soft  diet. He is scheduled for an outpatient type of evaluation in approximately  two weeks.                                                Lorelle Formosa, M.D.    WWM/MEDQ  D:  10/28/2003  T:  10/28/2003  Job:   [id number]

## 2011-05-16 NOTE — H&P (Signed)
NAME:  [name], [name]                         ACCOUNT NO.:  [id number]   MEDICAL RECORD NO.:  [id number]                   PATIENT TYPE:  EMS   LOCATION:  [facility]JO                                 FACILITY:  [facility]   PHYSICIAN:  Michael L. Thad Ranger, M.D.           DATE OF BIRTH:  06-15-1951   DATE OF ADMISSION:  02/22/2003  DATE OF DISCHARGE:                                HISTORY & PHYSICAL   CHIEF COMPLAINT:  Left sided numbness.   HISTORY OF PRESENT ILLNESS:  This is the initial [facility] system  admission for this 60 year old man with no known past medical history.  He  reports the acute onset three days ago of a numb sensation along the left  side of his upper face, mostly the area around his mouth and also his left  arm.  He also has noticed some intermittent blurring of vision those three  days along with some fluctuation of the symptoms in his left arm although  the left facial symptoms have been steady.  He has had a similar episode of  symptoms about six months ago which lasted one day and then resolved but  came to the emergency room at this time when the symptoms did not resolve on  their own.  He denied any pain with this except for minimal headache.  He  denies any associated dysarthria, dysphagia, or weakness of the extremities  and has been able to eat and speak and ambulate very well.   PAST MEDICAL HISTORY:  He denies any known chronic medical problems  including diabetes, hypertension and heart disease.   FAMILY HISTORY:  Remarkable for several members with hypertension and  diabetes.   SOCIAL HISTORY:  He smokes about one third of a pack of cigarettes a day.  He is normally independent in his activities of daily living.  He works as a  Biochemist, clinical.   ALLERGIES:  No known drug allergies.   MEDICATIONS:  Occasional Tylenol.   REVIEW OF SYSTEMS:  Head:  Slight headache.  Eyes:  Fluctuating of  blurriness of vision off to the left.  Unclear whether this  is mostly the  left eye or left field.  Ears:  No ear changes.  Constitutional:  No fevers  or chills.  Respiratory:  He did have a cough last week.  Cardiovascular:  No chest pain or palpitations.  GI:  No nausea, vomiting, diarrhea,  abdominal pain.  GU:  No urinary symptoms.  Musculoskeletal:  He reports  some cramps in his legs on occasion.  Neurologic:  As above.   PHYSICAL EXAMINATION:  VITALS:  Temperature 98.1, blood pressure 153/72,  pulse 73, respirations 19.  GENERAL:  This is a healthy appearing male in no acute distress.  HEAD:  Normocephalic and atraumatic.  Oropharynx is benign.  NECK:  Supple, without carotid bruits.  HEART:  Regular rate and rhythm, without murmurs.  CHEST:  Clear to  auscultation bilaterally.  ABDOMEN:  Soft, nontender, no hepatosplenomegaly.  EXTREMITIES:  No edema, 2+ pulses.  NEUROLOGIC:  Mental status:  He is awake, alert and oriented to time, place  and person.  Recent and remote memory are intact.  Attention span,  concentration and fund of knowledge are appropriate.  Speech is fluent and  not dysarthric.  He has no defects to confrontational naming.  Mood is  euthymic and affect appropriate.  Cranial nerves - funduscopic exam is  benign.  Pupils are equal and briskly reactive.  Extraocular movements no  nystagmus.  Visual fields full to confrontation.  Hearing was intact and  symmetric to finger rub.  Face:  Tongue and palate all move normally and  symmetrically.  Shoulder shrug strength normal.  Motor testing normal bulk  and tone, normal strength in all tested extremity muscles.  Sensation intact  to pinprick and light touch in all extremities.  Coordination and rapid  alternating movements are normal.  Finger to nose is performed well.  Reflexes 2+ and symmetric.  Toes are downgoing.  Gait is normal.  He is able  to heel toe and tandem walk.   LABORATORY DATA:  I-STAT 8 is unremarkable.  CT of the head is personally  reviewed and I grade it  as normal.   IMPRESSION:  Left hemisensory changes, subacute onset, most concerning for a  very small right brain event.  He is young and needs evaluation for stroke  risk factor assessment.   PLAN:  Will treat with aspirin.  Will briefly admit for work up including  MRI, MRA, carotid Doppler, echocardiogram, stroke laboratory including  potassium, lipids, homocysteine.  Anticipate discharge tomorrow.                                               Michael L. Thad Ranger, M.D.    MLR/MEDQ  D:  02/22/2003  T:  02/22/2003  Job:  [id number]

## 2011-05-16 NOTE — Discharge Summary (Signed)
NAME:  [name], [name]                         ACCOUNT NO.:  [id number]   MEDICAL RECORD NO.:  [id number]                   PATIENT TYPE:  INP   LOCATION:  3036                                 FACILITY:  [facility]   PHYSICIAN:  Marlan Palau, M.D.               DATE OF BIRTH:  06-Sep-1951   DATE OF ADMISSION:  02/22/2003  DATE OF DISCHARGE:  02/24/2003                                 DISCHARGE SUMMARY   ADMISSION DIAGNOSIS:  Onset of  left hemisensory deficit, rule out stroke.   DISCHARGE DIAGNOSES:  1. Left-sided numbness, etiology unclear.  No evidence of stroke by MRI.  2. Pituitary cyst.   PROCEDURES:  1. MRI scan of the brain.  2. MR angiogram.  3. Carotid Doppler study.  4. A 2-D echocardiogram.  5. Pituitary gland MRI scan.   HISTORY OF PRESENT ILLNESS:  The patient is a 60 year old black male born  Jul 31, 1951, without significant past medical history.  This patient  presented to [facility] with onset of some left-sided  sensory complaints that involved some numbness along the upper aspect of the  left face around the mouth and left arm.  The patient had some intermittent  blurring of vision with fluctuating symptoms in the left arm the last  several days prior to admission.  The patient had a similar episode six  months ago that lasted a day but resolved.  The patient denied any  significant problems with pain but did have minimal headache.  The patient  denies dysarthria, dysphagia, weakness, gait disturbance.  The patient is  brought in for an evaluation of possible stroke event.   PAST MEDICAL HISTORY:  This is roughly unremarkable.  No history of  diabetes, hypertension, heart disease.   HABITS:  The patient smokes a third of a pack of cigarettes a day, does not  drink to excess.   ALLERGIES:  No known drug allergies.   Please refer to history and physical dictation summary for social history,  family history, review of systems, and  physical examination.   LABORATORY DATA:  Sodium 138, potassium 4.0, chloride 109, BUN 9, glucose  105.  Hemoglobin 16, hematocrit 46, white count 6.4, platelets 149.  INR  0.9.  Calcium 8.9, total protein 6.8, albumin 3.1, AST 172, ALT 140, ALP 59,  total bilirubin 1.1.  Homocystine level 11.16.  Cholesterol 176,  triglycerides 202, HDL 40, VLDL 40, LDL 96.  Hepatitis antibody panel is  pending at the time of this evaluation.   EKG reveals sinus bradycardia, heart rate 54, otherwise normal EKG.   HOSPITAL COURSE:  This patient was admitted to [facility] and was evaluated for possible stroke event.  The patient had MRI  scan of the brain that showed some minimal nonspecific white matter changes.  No acute stroke was seen.  The patient has evidence of an  abnormal  appearance of the pituitary gland.  Possible cyst noted.  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  Otherwise MR  angiogram was unremarkable.  The patient underwent a 2-D echocardiogram  that revealed ejection fraction of 55 to 65%, otherwise no abnormalities  seen, no source of cardiogenic embolism was noted.  The patient did undergo  a carotid Doppler study that was unremarkable.  No internal carotid artery  stenosis was seen.  Antegrade vertebral artery flow was seen bilaterally.  The patient persisted with the mild sensory symptoms.  The patient was sent  down for pituitary scan and blood work to check the pituitary access  including a.m. cortisol level, LH, FSH, grave hormone level, TSH.  Will need  to add a prolactin level to this.   DISPOSITION:  Plans are for discharge the patient home today taking aspirin  325 mg a day.   ACTIVITY:  The patient is to have no strenuous activity for three weeks.  No  added salt diet.   FOLLOW UP:  Will follow up with Dr. Andrey Campanile and Dr. Thad Ranger within three  weeks  following discharge.   The results of the pituitary MRI scan done is pending at this time but will  follow up on this prior to discharge.  The patient has no primary physician.   DISCHARGE MEDICATIONS:  This will include only aspirin.                                               Marlan Palau, M.D.    CKW/MEDQ  D:  02/24/2003  T:  02/24/2003  Job:  [id number]   cc:   [facility]  [address]

## 2011-05-16 NOTE — Consult Note (Signed)
NAME:  [name], [name]                         ACCOUNT NO.:  [id number]   MEDICAL RECORD NO.:  [id number]                   PATIENT TYPE:  INP   LOCATION:  0353                                 FACILITY:  [facility]   PHYSICIAN:  James L. Malon Kindle., M.D.          DATE OF BIRTH:  08/10/1952   DATE OF CONSULTATION:  10/24/2003  DATE OF DISCHARGE:                                   CONSULTATION   REASON FOR CONSULTATION:  Rectal bleeding.   HISTORY:  The patient is a 60 year old African-American male who had been  well up until several days ago.  He came to the emergency room two or three  days ago and had an infected tooth.  He underwent evaluation in the  emergency room and apparently started on penicillin and Vicodin.  He was  having a little bit of abdominal pain then but it was not really very  significant.  For the past couple of days, this pain has gotten  progressively worse.  He describes pain in his lower abdomen, left lower  quadrant radiating across to the midline.  He has had normal bowel  movements, however.  Had some nausea without vomiting.  No heartburn,  indigestion, or any upper abdominal pain.  He has only taken one of the  Vicodin.  He got up today, had a normal bowel movement that he says was  formed and brown, and subsequently after this he had bright red blood and  presented to the emergency room.  Due to rectal bleeding, he was admitted.  His workup here in the emergency room included labs revealing white count of  14.9, hemoglobin of 13.3 with a normal ProTime.  Patient has not passed any  more blood since earlier today.  He has had no preceding diarrhea and the  picture is basically one of a progressive lower abdominal pain subsequently  followed by bleeding that began after a normal bowel movement.   CURRENT MEDICATIONS:  1. Some type of penicillin.  2. Vicodin for his dental abscess.   ALLERGIES:  He denies any drug allergies.   MEDICAL HISTORY:  He was  hospitalized in February of this year with a left  hemisensory deficit with a workup including MR angiogram without acute  stroke being seen.  He ultimately ended up discharged on an aspirin a day.  He has no other chronic medical problems.  Never had any surgery other than  removal of some sort of cutaneous cyst on his back.   FAMILY HISTORY:  Both parents are dead.  He does not really know what caused  their death.  They died when he was much younger.  He has one daughter that  is healthy.  There is Hodgkin's in his family but he denies any cancer.   SOCIAL HISTORY:  He is married, has one daughter.  He has been a smoker for  35 years and drinks alcohol approximately twice  a week, which he drinks  usually gin.   REVIEW OF SYSTEMS:  Remarkable for normal bowel movements, the lack of  constipation.  No heartburn, indigestion although he was told years ago he  might have an ulcer.  He normally does not have any rectal bleeding and has  never had any previous ____________.   PHYSICAL EXAMINATION:  Temperature 99.1, pulse 118, blood pressure 109/71,  O2 sat is 100% on room air.  GENERAL:  Very alert African-American male in no acute distress.  HEENT:  Eyes clear, nonicteric.  Throat, there does appear to be redness  around his gums which I presume is due to a dental infection.  LUNGS:  Clear anteriorly and posteriorly.  HEART:  Regular rate and rhythm without murmurs or gallops.  ABDOMEN:  Soft, generally nondistended with excellent bowel sounds.  In  fact, he has markedly hyperactive bowel sounds.  The upper abdomen is  nontender.  In the left lower quadrant there is isolated tenderness with  what appears to be mild rebound.  RECTAL:  Not repeated.  Apparently the ER staff observed blood in the stool.   ASSESSMENT:  1. Left lower quadrant pain, rectal pain, elevated white count - this could     well be due to diverticulitis, ischemic bowel is possible but he did not     have  preceding diarrhea.  There has been progressive onset of symptoms     rather than acute.   PLAN:  Will obtain a CT of the abdomen and pelvis to rule out a perforation  tonight.  Will keep him on clear liquids.  Will go ahead and start  antibiotics.  Will probably give Zosyn or Unasyn, and make further  recommendations depending on the results.                                               James L. Malon Kindle., M.D.    Waldron Session  D:  10/24/2003  T:  10/24/2003  Job:  [id number]   cc:   Lorelle Formosa, M.D.  [phone] [address]. [address]  [place]  [place] [address]  Fax: [phone]

## 2011-09-03 ENCOUNTER — Other Ambulatory Visit: Payer: Medicaid Other

## 2011-09-08 ENCOUNTER — Other Ambulatory Visit: Payer: Medicaid Other

## 2011-09-11 ENCOUNTER — Inpatient Hospital Stay: Admission: RE | Admit: 2011-09-11 | Payer: Medicaid Other | Source: Ambulatory Visit

## 2011-09-22 ENCOUNTER — Emergency Department (HOSPITAL_COMMUNITY)
Admission: EM | Admit: 2011-09-22 | Discharge: 2011-09-22 | Disposition: A | Discharge status: Home / Self Care | Payer: Medicaid Other | Attending: Emergency Medicine | Admitting: Emergency Medicine

## 2011-09-22 ENCOUNTER — Emergency Department (HOSPITAL_COMMUNITY): Payer: Medicaid Other

## 2011-09-22 ENCOUNTER — Encounter (HOSPITAL_COMMUNITY): Payer: Self-pay | Admitting: Radiology

## 2011-09-22 DIAGNOSIS — R5381 Other malaise: Secondary | ICD-10-CM | POA: Insufficient documentation

## 2011-09-22 DIAGNOSIS — E119 Type 2 diabetes mellitus without complications: Secondary | ICD-10-CM | POA: Insufficient documentation

## 2011-09-22 DIAGNOSIS — D352 Benign neoplasm of pituitary gland: Secondary | ICD-10-CM | POA: Insufficient documentation

## 2011-09-22 DIAGNOSIS — R42 Dizziness and giddiness: Secondary | ICD-10-CM | POA: Insufficient documentation

## 2011-09-22 DIAGNOSIS — H538 Other visual disturbances: Secondary | ICD-10-CM | POA: Insufficient documentation

## 2011-09-22 DIAGNOSIS — I10 Essential (primary) hypertension: Secondary | ICD-10-CM | POA: Insufficient documentation

## 2011-09-22 DIAGNOSIS — Z79899 Other long term (current) drug therapy: Secondary | ICD-10-CM | POA: Insufficient documentation

## 2011-09-22 DIAGNOSIS — I69998 Other sequelae following unspecified cerebrovascular disease: Secondary | ICD-10-CM | POA: Insufficient documentation

## 2011-09-22 DIAGNOSIS — R51 Headache: Secondary | ICD-10-CM | POA: Insufficient documentation

## 2011-09-22 HISTORY — DX: Opioid abuse, uncomplicated: F11.10

## 2011-09-22 HISTORY — DX: Cerebral infarction, unspecified: I63.9

## 2011-09-22 HISTORY — DX: Neoplasm of unspecified behavior of brain: D49.6

## 2011-09-22 HISTORY — DX: Essential (primary) hypertension: I10

## 2011-09-22 LAB — CBC
HCT: 40.7 % (ref 39.0–52.0)
Hemoglobin: 13.8 g/dL (ref 13.0–17.0)
MCH: 24.5 pg — ABNORMAL LOW (ref 26.0–34.0)
MCHC: 33.9 g/dL (ref 30.0–36.0)
MCV: 72.3 fL — ABNORMAL LOW (ref 78.0–100.0)
Platelets: 258 K/uL (ref 150–400)
RBC: 5.63 MIL/uL (ref 4.22–5.81)
RDW: 15.1 % (ref 11.5–15.5)
WBC: 14.1 K/uL — ABNORMAL HIGH (ref 4.0–10.5)

## 2011-09-22 LAB — COMPREHENSIVE METABOLIC PANEL WITH GFR
ALT: 41 U/L (ref 0–53)
AST: 36 U/L (ref 0–37)
Albumin: 4.4 g/dL (ref 3.5–5.2)
Alkaline Phosphatase: 64 U/L (ref 39–117)
BUN: 11 mg/dL (ref 6–23)
CO2: 25 meq/L (ref 19–32)
Calcium: 10 mg/dL (ref 8.4–10.5)
Chloride: 105 meq/L (ref 96–112)
Creatinine, Ser: 0.88 mg/dL (ref 0.50–1.35)
GFR calc Af Amer: >60 mL/min
GFR calc non Af Amer: >60 mL/min
Glucose, Bld: 90 mg/dL (ref 70–99)
Potassium: 3.8 meq/L (ref 3.5–5.1)
Sodium: 141 meq/L (ref 135–145)
Total Bilirubin: 0.6 mg/dL (ref 0.3–1.2)
Total Protein: 8.4 g/dL — ABNORMAL HIGH (ref 6.0–8.3)

## 2011-09-22 LAB — DIFFERENTIAL
Basophils Absolute: 0 K/uL (ref 0.0–0.1)
Basophils Relative: 0 % (ref 0–1)
Eosinophils Absolute: 0.2 K/uL (ref 0.0–0.7)
Eosinophils Relative: 1 % (ref 0–5)
Lymphocytes Relative: 27 % (ref 12–46)
Lymphs Abs: 3.9 K/uL (ref 0.7–4.0)
Monocytes Absolute: 0.9 K/uL (ref 0.1–1.0)
Monocytes Relative: 7 % (ref 3–12)
Neutro Abs: 9.1 K/uL — ABNORMAL HIGH (ref 1.7–7.7)
Neutrophils Relative %: 65 % (ref 43–77)

## 2011-09-22 LAB — GLUCOSE, CAPILLARY: Glucose-Capillary: 93 mg/dL (ref 70–99)

## 2011-09-23 ENCOUNTER — Emergency Department (HOSPITAL_COMMUNITY): Payer: Medicaid Other

## 2011-09-23 LAB — URINALYSIS, ROUTINE W REFLEX MICROSCOPIC
Glucose, UA: NEGATIVE mg/dL
Hgb urine dipstick: NEGATIVE
Ketones, ur: 15 mg/dL — AB
Protein, ur: NEGATIVE mg/dL

## 2011-09-23 LAB — POCT I-STAT TROPONIN I: Troponin i, poc: 0 ng/mL (ref 0.00–0.08)

## 2011-09-23 LAB — URINE MICROSCOPIC-ADD ON

## 2011-09-23 MED ORDER — GADOBENATE DIMEGLUMINE 529 MG/ML IV SOLN
15.0000 mL | Freq: Once | INTRAVENOUS | Status: AC
  Administered 2011-09-23: 15 mL via INTRAVENOUS

## 2011-09-26 ENCOUNTER — Emergency Department (HOSPITAL_COMMUNITY)
Admission: EM | Admit: 2011-09-26 | Discharge: 2011-09-26 | Disposition: A | Discharge status: Home / Self Care | Payer: Medicaid Other | Attending: Emergency Medicine | Admitting: Emergency Medicine

## 2011-09-26 DIAGNOSIS — Z8679 Personal history of other diseases of the circulatory system: Secondary | ICD-10-CM | POA: Insufficient documentation

## 2011-09-26 DIAGNOSIS — R209 Unspecified disturbances of skin sensation: Secondary | ICD-10-CM | POA: Insufficient documentation

## 2011-09-26 DIAGNOSIS — E119 Type 2 diabetes mellitus without complications: Secondary | ICD-10-CM | POA: Insufficient documentation

## 2011-09-26 DIAGNOSIS — R51 Headache: Secondary | ICD-10-CM | POA: Insufficient documentation

## 2011-09-26 DIAGNOSIS — R42 Dizziness and giddiness: Secondary | ICD-10-CM | POA: Insufficient documentation

## 2011-09-26 DIAGNOSIS — I10 Essential (primary) hypertension: Secondary | ICD-10-CM | POA: Insufficient documentation

## 2011-09-26 LAB — RAPID URINE DRUG SCREEN, HOSP PERFORMED: Opiates: POSITIVE — AB

## 2011-09-26 LAB — GLUCOSE, CAPILLARY: Glucose-Capillary: 116 mg/dL — ABNORMAL HIGH (ref 70–99)

## 2011-09-30 LAB — LIPID PANEL
Cholesterol: 149
LDL Cholesterol: 53
Triglycerides: 246 — ABNORMAL HIGH

## 2011-09-30 LAB — COMPREHENSIVE METABOLIC PANEL
Albumin: 3.6
Alkaline Phosphatase: 70
BUN: 15
BUN: 21
CO2: 22
Calcium: 9.4
Chloride: 107
Creatinine, Ser: 1.05
Creatinine, Ser: 1.09
GFR calc non Af Amer: >60
Glucose, Bld: 108 — ABNORMAL HIGH
Glucose, Bld: 155 — ABNORMAL HIGH
Potassium: 3.9
Total Bilirubin: 0.8
Total Protein: 7.2

## 2011-09-30 LAB — DRUGS OF ABUSE SCREEN W/O ALC, ROUTINE URINE
Benzodiazepines.: NEGATIVE
Cocaine Metabolites: NEGATIVE
Creatinine,U: 160.5 mg/dL
Methadone: NEGATIVE
Phencyclidine (PCP): NEGATIVE
Propoxyphene: NEGATIVE

## 2011-09-30 LAB — POCT I-STAT, CHEM 8
Glucose, Bld: 154 mg/dL — ABNORMAL HIGH (ref 70–99)
HCT: 48 % (ref 39.0–52.0)
Hemoglobin: 16.3 g/dL (ref 13.0–17.0)
Potassium: 4.3 mEq/L (ref 3.5–5.1)
Sodium: 142 mEq/L (ref 135–145)

## 2011-09-30 LAB — CBC
HCT: 40.4 % (ref 39.0–52.0)
HCT: 42.2
HCT: 43.1
Hemoglobin: 14.1 g/dL (ref 13.0–17.0)
Hemoglobin: 13.7
Hemoglobin: 13.9
MCHC: 32.6 g/dL (ref 30.0–36.0)
MCHC: 33 g/dL (ref 30.0–36.0)
MCHC: 32.9
MCV: 76.1 fL — ABNORMAL LOW (ref 78.0–100.0)
MCV: 77.9 — ABNORMAL LOW
Platelets: 256
RBC: 5.22 MIL/uL (ref 4.22–5.81)
RBC: 5.6 MIL/uL (ref 4.22–5.81)
RDW: 13.7 % (ref 11.5–15.5)
RDW: 14.1
WBC: 13.7 10*3/uL — ABNORMAL HIGH (ref 4.0–10.5)
WBC: 11.8 — ABNORMAL HIGH

## 2011-09-30 LAB — DIFFERENTIAL
Band Neutrophils: 2 % (ref 0–10)
Basophils Absolute: 0 10*3/uL (ref 0.0–0.1)
Basophils Relative: 0 % (ref 0–1)
Lymphs Abs: 3.2 10*3/uL (ref 0.7–4.0)
Lymphs Abs: 4.3 — ABNORMAL HIGH
Monocytes Relative: 5
Myelocytes: 0 %
Neutro Abs: 7.8 — ABNORMAL HIGH
Neutrophils Relative %: 60
Promyelocytes Absolute: 0 %

## 2011-09-30 LAB — BASIC METABOLIC PANEL
BUN: 13 mg/dL (ref 6–23)
Chloride: 104 mEq/L (ref 96–112)
Creatinine, Ser: 0.96 mg/dL (ref 0.4–1.5)
GFR calc Af Amer: >60 mL/min (ref >60)
GFR calc non Af Amer: >60 mL/min (ref >60)
Potassium: 3.7 mEq/L (ref 3.5–5.1)

## 2011-09-30 LAB — POCT CARDIAC MARKERS
CKMB, poc: 3 ng/mL (ref 1.0–8.0)
Myoglobin, poc: 117
Troponin i, poc: <0.05

## 2011-09-30 LAB — CARDIAC PANEL(CRET KIN+CKTOT+MB+TROPI)
CK, MB: 1.4 ng/mL (ref 0.3–4.0)
CK, MB: 1.8
Relative Index: 0.6 (ref 0.0–2.5)
Total CK: 135
Troponin I: 0.01 ng/mL (ref 0.00–0.06)

## 2011-09-30 LAB — RAPID URINE DRUG SCREEN, HOSP PERFORMED
Amphetamines: NOT DETECTED
Barbiturates: NOT DETECTED
Benzodiazepines: NOT DETECTED
Cocaine: POSITIVE — AB

## 2011-09-30 LAB — OPIATE, QUANTITATIVE, URINE
Codeine Urine: NEGATIVE ng/mL
Hydrocodone: NEGATIVE ng/mL
Hydromorphone GC/MS Conf: 1920 ng/mL
Morphine, Confirm: 1820 ng/mL
Oxymorphone: 2410 ng/mL

## 2011-09-30 LAB — TROPONIN I: Troponin I: 0.01

## 2011-09-30 LAB — GLUCOSE, CAPILLARY
Glucose-Capillary: 132 mg/dL — ABNORMAL HIGH (ref 70–99)
Glucose-Capillary: 135 mg/dL — ABNORMAL HIGH (ref 70–99)
Glucose-Capillary: 218 mg/dL — ABNORMAL HIGH (ref 70–99)
Glucose-Capillary: 246 mg/dL — ABNORMAL HIGH (ref 70–99)
Glucose-Capillary: 102 — ABNORMAL HIGH
Glucose-Capillary: 130 — ABNORMAL HIGH

## 2011-09-30 LAB — CK TOTAL AND CKMB (NOT AT ARMC)
CK, MB: 1.8
Relative Index: 0.5 (ref 0.0–2.5)
Total CK: 199

## 2011-10-02 NOTE — Consult Note (Signed)
NAMERUBE, SANCHEZ NO.:  [id number]  MEDICAL RECORD NO.:  [id number]  LOCATION:  [facility]                         FACILITY:  [facility]  PHYSICIAN:  Vania Rea, M.D. DATE OF BIRTH:  November 23, 1951  DATE OF CONSULTATION:  09/23/2011 DATE OF DISCHARGE:                                CONSULTATION   PRIMARY CARE PHYSICIAN:  [facility].  PHYSICIAN REQUESTING CONSULT:  Glynn Octave, MD, Emergency Room physician.  CONSULTING PHYSICIAN:  Vania Rea, MD  REASON FOR CONSULT:  Vertigo and nausea since 5:00 a.m. yesterday morning.  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  The patient has been in baseline state of health plus last used heroin 5 days ago.  Drinks about 5 beer per day and awoke this morning about 5:00 a.m. with nausea and vertigo, but no vomiting.  The patient also says he has been having tingling in his left hand and his left foot.  Denies chest pain or shortness of breath.  Denies vomiting, diarrhea.  Denies lower extremity edema.  Denies any difficulty walking other than his baseline, ataxia related to his left hemiplegia.  Denies any impairment of speech or difficulty swallowing.  The patient came to the emergency room to be evaluated but because he did not arrive to the emergency room until 8:00 p.m., he was not seen in time for an MRI to be done and the Hospitalist Service was called to admit this gentleman for observation until an MRI can be obtained.  PAST MEDICAL HISTORY: 1. History of stroke. 2. Diabetes type 2, on metformin. 3. Polysubstance abuse as noted above. 4. 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  MEDICATIONS:  Folic acid, lisinopril, metformin, Norvasc pravastatin, and Ultram.  ALLERGIES:   No known drug allergies.  SOCIAL HISTORY:  As noted above.  Smokes 1 pack per day.  FAMILY HISTORY:  Reports diabetes in brother, sister and mother.  REVIEW OF SYSTEMS:  Other than noted above, the patient denies problems on review of systems.  PHYSICAL EXAMINATION:  GENERAL:  A pleasant middle-aged African American gentleman sitting up on the stretcher eating a supper without any difficulty. VITAL SIGNS:  His temperature is 98.4, pulse 80, respirations 18, blood pressure 165/79.  He is saturating at 96% on room air. HEENT:  His pupils are round, equal, reactive.  Mucous membranes pink, anicteric. NECK:  No cervical lymphadenopathy or thyromegaly. CHEST:  Clear to auscultation bilaterally. CARDIOVASCULAR:  Regular rhythm.  No murmurs. ABDOMEN:  Soft, nontender.  No masses. EXTREMITIES:  Without edema. CENTRAL NERVOUS SYSTEM:  He has a mild left hemiplegia which he says is not new or unchanged.  He has no nystagmus.  His labs; his white count 14.1, hemoglobin 13.8, MCV 72.3, platelets 158.  His sodium is 141, potassium is 3.8, chloride 105, pO2 25, glucose 90, BUN 11, creatinine 0.88, total protein 8.4, albumin 4.4, calcium 9.0.  His ESR is normal at 6.  Urinalysis shows 15 ketones, moderate amount of leukocyte esterase.  Urine microscopy showed contamination of squamous epithelial cells, 11-20 white cells and few bacteria.  His troponin is undetectable.  CT scan of the brain shows no significant change or acute findings compared to a CT done in March 2012.  RECOMMENDATIONS: 1. It is now 2:55 a.m., the MRI department will be opened in another 3     hours.  Since we do not think it is a bit appropriate to bring this     gentleman on observation merely from the point of view of getting     an MRI done, we would recommend the patient wait in the CDU until     his workup is completed with an MRI.  If abnormalities are noted on     the MRI, the Hospitalist Service can be reconsulted  for admission.     Otherwise, the patient can be discharged home with repeated advise     to seek assistance for narcotic withdrawal, alcohol and tobacco     withdrawal.  Advise continued compliance with his medication.     Follow up with his neurologist Dr. Wynetta Emery as previously recommended. 2. We would recommend continued hydration and in view of the leukocyte     esterase in his urine, consider treating for Chlamydia or doing     further workup for urinary tract infection as indicated.     Vania Rea, M.D.     LC/MEDQ  D:  09/23/2011  T:  09/23/2011  Job:  [id number]  cc:   Glynn Octave, MD Vallery [place], M.D.  Electronically Signed by Vania Rea M.D. on 10/02/2011 07:32:16 PM

## 2011-10-07 LAB — BASIC METABOLIC PANEL
CO2: 25
Calcium: 9.3
GFR calc Af Amer: >60
GFR calc non Af Amer: >60
Sodium: 134 — ABNORMAL LOW

## 2011-10-08 LAB — URINALYSIS, ROUTINE W REFLEX MICROSCOPIC
Nitrite: NEGATIVE
Specific Gravity, Urine: 1.017
Urobilinogen, UA: 1

## 2011-10-08 LAB — URINE MICROSCOPIC-ADD ON

## 2011-10-08 LAB — URINE CULTURE

## 2011-10-13 LAB — COMPREHENSIVE METABOLIC PANEL
ALT: 50
ALT: 50
ALT: 58 — ABNORMAL HIGH
AST: 46 — ABNORMAL HIGH
AST: 60 — ABNORMAL HIGH
Albumin: 3.5
Albumin: 3.8
Alkaline Phosphatase: 89
Alkaline Phosphatase: 99
CO2: 26
Calcium: 10
Calcium: 8.8
Creatinine, Ser: 1.18
GFR calc Af Amer: >60
GFR calc Af Amer: >60
GFR calc non Af Amer: >60
Glucose, Bld: 327 — ABNORMAL HIGH
Glucose, Bld: 495 — ABNORMAL HIGH
Potassium: 3.7
Potassium: 4.6
Sodium: 129 — ABNORMAL LOW
Sodium: 132 — ABNORMAL LOW
Sodium: 137
Total Protein: 7.9
Total Protein: 8.5 — ABNORMAL HIGH

## 2011-10-13 LAB — KETONES, QUALITATIVE: Acetone, Bld: NEGATIVE

## 2011-10-13 LAB — DIFFERENTIAL
Basophils Relative: 0
Eosinophils Absolute: 0.2
Eosinophils Relative: 2
Monocytes Absolute: 0.5
Monocytes Relative: 4
Neutrophils Relative %: 81 — ABNORMAL HIGH

## 2011-10-13 LAB — APTT: aPTT: 29

## 2011-10-13 LAB — URINE MICROSCOPIC-ADD ON

## 2011-10-13 LAB — CBC
Hemoglobin: 14.9
RDW: 13.3

## 2011-10-13 LAB — RAPID URINE DRUG SCREEN, HOSP PERFORMED
Benzodiazepines: NOT DETECTED
Benzodiazepines: NOT DETECTED
Cocaine: NOT DETECTED
Cocaine: NOT DETECTED
Opiates: POSITIVE — AB
Opiates: POSITIVE — AB
Tetrahydrocannabinol: NOT DETECTED

## 2011-10-13 LAB — BASIC METABOLIC PANEL
BUN: 6
CO2: 19
Calcium: 9.1
Calcium: 9.3
Creatinine, Ser: 0.86
Creatinine, Ser: 0.95
GFR calc Af Amer: >60
GFR calc Af Amer: >60
GFR calc non Af Amer: >60
Glucose, Bld: 198 — ABNORMAL HIGH
Sodium: 137

## 2011-10-13 LAB — BLOOD GAS, ARTERIAL
O2 Saturation: 88.8
Patient temperature: 98.6
pH, Arterial: 7.436

## 2011-10-13 LAB — URINALYSIS, ROUTINE W REFLEX MICROSCOPIC
Bilirubin Urine: NEGATIVE
Glucose, UA: >1000 — AB
Hgb urine dipstick: NEGATIVE
Ketones, ur: 40 — AB
Nitrite: NEGATIVE
Protein, ur: NEGATIVE
Specific Gravity, Urine: 1.042 — ABNORMAL HIGH
Urobilinogen, UA: 1
Urobilinogen, UA: 1

## 2011-10-13 LAB — ETHANOL: Alcohol, Ethyl (B): <5

## 2011-10-13 LAB — PROTIME-INR: INR: 1

## 2011-10-13 LAB — TSH: TSH: 0.367

## 2011-10-13 LAB — URINE CULTURE
Colony Count: NO GROWTH
Culture: NO GROWTH

## 2011-10-13 LAB — TRICYCLICS SCREEN, URINE: TCA Scrn: NOT DETECTED

## 2011-10-13 LAB — SALICYLATE LEVEL: Salicylate Lvl: <4

## 2011-10-13 LAB — CHOLESTEROL, TOTAL: Cholesterol: 115

## 2012-03-14 ENCOUNTER — Other Ambulatory Visit: Payer: Self-pay

## 2012-03-14 ENCOUNTER — Emergency Department (HOSPITAL_COMMUNITY)
Admission: EM | Admit: 2012-03-14 | Discharge: 2012-03-14 | Disposition: A | Discharge status: Home / Self Care | Payer: Medicaid Other | Attending: Emergency Medicine | Admitting: Emergency Medicine

## 2012-03-14 ENCOUNTER — Encounter (HOSPITAL_COMMUNITY): Payer: Self-pay | Admitting: Emergency Medicine

## 2012-03-14 ENCOUNTER — Emergency Department (HOSPITAL_COMMUNITY): Payer: Medicaid Other

## 2012-03-14 DIAGNOSIS — Z8673 Personal history of transient ischemic attack (TIA), and cerebral infarction without residual deficits: Secondary | ICD-10-CM | POA: Insufficient documentation

## 2012-03-14 DIAGNOSIS — E119 Type 2 diabetes mellitus without complications: Secondary | ICD-10-CM | POA: Insufficient documentation

## 2012-03-14 DIAGNOSIS — R5381 Other malaise: Secondary | ICD-10-CM | POA: Insufficient documentation

## 2012-03-14 DIAGNOSIS — H538 Other visual disturbances: Secondary | ICD-10-CM | POA: Insufficient documentation

## 2012-03-14 DIAGNOSIS — R51 Headache: Secondary | ICD-10-CM

## 2012-03-14 DIAGNOSIS — I10 Essential (primary) hypertension: Secondary | ICD-10-CM | POA: Insufficient documentation

## 2012-03-14 DIAGNOSIS — Z136 Encounter for screening for cardiovascular disorders: Secondary | ICD-10-CM | POA: Insufficient documentation

## 2012-03-14 DIAGNOSIS — R42 Dizziness and giddiness: Secondary | ICD-10-CM | POA: Insufficient documentation

## 2012-03-14 LAB — SEDIMENTATION RATE: Sed Rate: 0 mm/hr (ref 0–16)

## 2012-03-14 LAB — DIFFERENTIAL
Basophils Absolute: 0 10*3/uL (ref 0.0–0.1)
Basophils Relative: 0 % (ref 0–1)
Eosinophils Absolute: 0.2 10*3/uL (ref 0.0–0.7)
Lymphocytes Relative: 24 % (ref 12–46)
Monocytes Absolute: 1.2 10*3/uL — ABNORMAL HIGH (ref 0.1–1.0)
Neutro Abs: 11.4 10*3/uL — ABNORMAL HIGH (ref 1.7–7.7)

## 2012-03-14 LAB — BASIC METABOLIC PANEL
BUN: 15 mg/dL (ref 6–23)
CO2: 26 mEq/L (ref 19–32)
Calcium: 9.7 mg/dL (ref 8.4–10.5)
Creatinine, Ser: 1.08 mg/dL (ref 0.50–1.35)
GFR calc non Af Amer: 73 mL/min — ABNORMAL LOW (ref >90)
Glucose, Bld: 110 mg/dL — ABNORMAL HIGH (ref 70–99)
Sodium: 142 mEq/L (ref 135–145)

## 2012-03-14 LAB — HEPATIC FUNCTION PANEL
ALT: 37 U/L (ref 0–53)
AST: 31 U/L (ref 0–37)
Albumin: 4 g/dL (ref 3.5–5.2)
Alkaline Phosphatase: 59 U/L (ref 39–117)
Bilirubin, Direct: <0.1 mg/dL (ref 0.0–0.3)
Total Bilirubin: 0.4 mg/dL (ref 0.3–1.2)
Total Protein: 7.9 g/dL (ref 6.0–8.3)

## 2012-03-14 LAB — CBC
HCT: 44 % (ref 39.0–52.0)
Hemoglobin: 14.9 g/dL (ref 13.0–17.0)
MCH: 25 pg — ABNORMAL LOW (ref 26.0–34.0)
MCHC: 33.9 g/dL (ref 30.0–36.0)
MCV: 73.8 fL — ABNORMAL LOW (ref 78.0–100.0)
Platelets: 236 10*3/uL (ref 150–400)
RBC: 5.96 MIL/uL — ABNORMAL HIGH (ref 4.22–5.81)
RDW: 14.4 % (ref 11.5–15.5)
WBC: 16.9 10*3/uL — ABNORMAL HIGH (ref 4.0–10.5)

## 2012-03-14 LAB — RAPID URINE DRUG SCREEN, HOSP PERFORMED
Amphetamines: NOT DETECTED
Opiates: POSITIVE — AB

## 2012-03-14 LAB — ETHANOL: Alcohol, Ethyl (B): <11 mg/dL (ref 0–11)

## 2012-03-14 MED ORDER — SODIUM CHLORIDE 0.9 % IV BOLUS (SEPSIS)
500.0000 mL | Freq: Once | INTRAVENOUS | Status: AC
  Administered 2012-03-14: 500 mL via INTRAVENOUS

## 2012-03-14 MED ORDER — IOHEXOL 350 MG/ML SOLN
50.0000 mL | Freq: Once | INTRAVENOUS | Status: AC | PRN
  Administered 2012-03-14: 50 mL via INTRAVENOUS

## 2012-03-14 MED ORDER — OXYCODONE-ACETAMINOPHEN 5-325 MG PO TABS
1.0000 | ORAL_TABLET | Freq: Once | ORAL | Status: AC
  Administered 2012-03-14: 1 via ORAL
  Filled 2012-03-14: qty 1

## 2012-03-14 NOTE — ED Notes (Signed)
Breakfast tray ordered 

## 2012-03-14 NOTE — ED Provider Notes (Signed)
History     CSN: [id number]  Arrival date & time 03/14/12  0111   First MD Initiated Contact with Patient 03/14/12 0200     Chief Complaint  Patient presents with  . Dizziness     Patient is a 61 y.o. male presenting with weakness. The history is provided by the patient.  Weakness The primary symptoms include headaches, dizziness and visual change. Primary symptoms do not include syncope, loss of consciousness, seizures, focal weakness, loss of sensation, speech change, memory loss, fever, nausea or vomiting. The symptoms began yesterday. Episode duration: several hours. The symptoms are unchanged. The neurological symptoms are diffuse.  The headache is associated with visual change and weakness. The headache is not associated with neck stiffness.  Dizziness also occurs with weakness. Dizziness does not occur with nausea or vomiting.  Additional symptoms include weakness. Additional symptoms do not include neck stiffness.  Pt reports he has felt "dizziness" since yesterday Reports "world spinning" when he moves in bed No syncope No cp/sob No focal weakness that is new - he reports residual weakness to left UE and left LE from previous CVA Reports blurred vision but no visual loss He reports mild HA but not severe in nature He denies drug use and denies medication change Past Medical History  Diagnosis Date  . Hypertension   . Diabetes mellitus   . Brain tumor   . CVA (cerebral vascular accident)   . Narcotic abuse     No past surgical history on file.  No family history on file.  History  Substance Use Topics  . Smoking status: Not on file  . Smokeless tobacco: Not on file  . Alcohol Use: Not on file      Review of Systems  Constitutional: Negative for fever.  HENT: Negative for neck stiffness.   Cardiovascular: Negative for syncope.  Gastrointestinal: Negative for nausea and vomiting.  Neurological: Positive for dizziness, weakness and headaches. Negative for  speech change, focal weakness, seizures and loss of consciousness.  Psychiatric/Behavioral: Negative for memory loss.  All other systems reviewed and are negative.    Allergies  Review of patient's allergies indicates no known allergies.  Home Medications   Current Outpatient Rx  Name Route Sig Dispense Refill  . AMLODIPINE BESYLATE 10 MG PO TABS Oral Take 10 mg by mouth daily.    . ASPIRIN 81 MG PO CHEW Oral Chew 81 mg by mouth daily.    Marland Kitchen FOLIC ACID 1 MG PO TABS Oral Take 1 mg by mouth daily.    Marland Kitchen LISINOPRIL 20 MG PO TABS Oral Take 20 mg by mouth daily.    Marland Kitchen MECLIZINE HCL 25 MG PO TABS Oral Take 25 mg by mouth 3 (three) times daily as needed. For dizziness    . METFORMIN HCL 500 MG PO TABS Oral Take 500 mg by mouth 2 (two) times daily with a meal.    . PRAVASTATIN SODIUM 20 MG PO TABS Oral Take 20 mg by mouth daily.    Bernadette Hoit SODIUM 8.6-50 MG PO TABS Oral Take 1 tablet by mouth daily.    Marland Kitchen VITAMIN B-1 250 MG PO TABS Oral Take 250 mg by mouth daily.      BP 182/87  Pulse 80  Temp(Src) 98.6 F (37 C) (Oral)  Resp 12  SpO2 98%  Physical Exam CONSTITUTIONAL: Well developed/well nourished HEAD AND FACE: Normocephalic/atraumatic EYES: EOMI/PERRL, no nystagmus.  Pupils pinpoint/equal, no corneal hazing, no conjunctival injection ENMT: Mucous membranes moist NECK: supple  no meningeal signs SPINE:entire spine nontender CV: S1/S2 noted, no murmurs/rubs/gallops noted LUNGS: Lungs are clear to auscultation bilaterally, no apparent distress ABDOMEN: soft, nontender, no rebound or guarding GU:no cva tenderness NEURO: Pt is awake/alert, moves all extremitiesx4.  He has decreased grip with left hand (chronic) and has left LE drift (chronic per patient)  No visual field deficit noted.  No facial droop.  No new sensory deficit noted.  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  He reports it is improved with closing left eye.  He reports he had visual loss in left eye with stroke last year, but it improved but reports not "as strong" as right eye He is able to ambulate though does not have his leg brace  I spoke to neuro on call and described his clinical picture which is confusing.  We discussed his previous MR/MRA results They recommend CTA head/neck Will also check visual acuity and sed rate  D/w radiology, CT angio negative for acute disease/dissection/aneurysm (prelim per rads phone call) Pt now reports his vision is similar to previous times of blurred vision.  He has no obvious field deficit and no visual loss, just reports blurred vision.  His visual acuity is noted but unclear if this is acute.  He reports he has ophthalmologist he can f/u with ([place])  Denies visual loss, doubt CRAO/CRVO.  At this time, I feel he is safe for d/c.   Discussed need for followup Pt given info on holding his metformin due to IV contrast  The patient appears reasonably screened  and/or stabilized for discharge and I doubt any other medical condition or other [facility] requiring further screening, evaluation, or treatment in the ED at this time prior to discharge.   MDM  Nursing notes reviewed and considered in documentation All labs/vitals reviewed and considered Previous records reviewed and considered        Date: 03/14/2012  Rate: 85  Rhythm: normal sinus rhythm  QRS Axis: normal  Intervals: normal  ST/T Wave abnormalities: nonspecific ST changes  Conduction Disutrbances:none  Narrative Interpretation:   Old EKG Reviewed: unchanged    Joya Gaskins, MD 03/14/12 [phone]

## 2012-03-14 NOTE — ED Notes (Signed)
Patient transported to CT 

## 2012-03-14 NOTE — ED Notes (Signed)
Patient with dizziness starting during the day today.  Patient also with "shaking".  Patient has history of previous stroke, weakness on left side from previous stroke.

## 2012-03-14 NOTE — Discharge Instructions (Signed)
You are having a headache. No specific cause was found today for your headache. It may have been a migraine or other cause of headache. Stress, anxiety, fatigue, and depression are common triggers for headaches. Your headache today does not appear to be life-threatening or require hospitalization, but often the exact cause of headaches is not determined in the emergency department. Therefore, follow-up with your doctor is very important to find out what may have caused your headache, and whether or not you need any further diagnostic testing or treatment. Sometimes headaches can appear benign (not harmful), but then more serious symptoms can develop which should prompt an immediate re-evaluation by your doctor or the emergency department.  SEEK MEDICAL ATTENTION IF:  You develop possible problems with medications prescribed.  The medications don't resolve your headache, if it recurs , or if you have multiple episodes of vomiting or can't take fluids. You have a change from the usual headache.  RETURN IMMEDIATELY IF you develop a sudden, severe headache or confusion, become poorly responsive or faint, develop a fever above 100.33F or problem breathing, have a change in speech, vision, swallowing, or understanding, or develop new weakness, numbness, tingling, incoordination, or have a seizure.   Metformin and X-ray Contrast Studies For some X-ray exams, a contrast dye is used. Contrast dye is a type of medicine used to make the X-ray image clearer. The contrast dye is given to the patient through a vein (intravenously). If you need to have this type of X-ray exam and you take a medication called metformin, your caregiver may have you stop taking metformin before the exam.  LACTIC ACIDOSIS In rare cases, a serious medical condition called lactic acidosis can develop in people who take metformin and receive contrast dye. The following conditions can increase the risk of this complication:   Kidney  failure.   Liver problems.   Certain types of heart problems such as:   Heart failure.   Heart attack.   Heart infection.   Heart valve problems.   Alcohol abuse.  If left untreated, lactic acidosis can lead to coma.  SYMPTOMS OF LACTIC ACIDOSIS Symptoms of lactic acidosis can include:  Rapid breathing (hyperventilation).   Neurologic symptoms such as:   Headaches.   Confusion.   Dizziness.   Excessive sweating.   Feeling sick to your stomach (nauseous) or throwing up (vomiting).  AFTER THE X-RAY EXAM  Stay well-hydrated. Drink fluids as instructed by your caregiver.   If you have a risk of developing lactic acidosis, blood tests may be done to make sure your kidney function is okay.   Metformin is usually stopped for 48 hours after the X-ray exam. Ask your caregiver when you can start taking metformin again.  SEEK MEDICAL CARE IF:   You have shortness of breath or difficulty breathing.   You develop a headache that does not go away.   You have nausea or vomiting.   You urinate more than normal.   You develop a skin rash and have:   Redness.   Swelling.   Itching.  Document Released: 12/03/2009 Document Revised: 12/04/2011 Document Reviewed: 12/03/2009 [facility] Patient Information 2012 [place], [place].

## 2012-03-14 NOTE — ED Notes (Signed)
EDP at bedside  

## 2012-03-14 NOTE — ED Notes (Addendum)
Pt walked to room with [facility]. No respiratory distress. No pain. Vitals at baseline. Pt came to the ED for intermittent HA and Dizziness. Currently pt is not dizzy. HA present and medication was given by [facility].  Will continue to monitor.

## 2012-03-14 NOTE — ED Notes (Signed)
Patient here with headache and dizziness.  Patient states that it has been going on all day today.  Patient has history of previous stroke, weakness on left with residual weakness on left.  Patient is CAOx3, appropriate.  Denies any chest pain, no shortness of breath.  Mild nausea.

## 2012-03-14 NOTE — ED Notes (Signed)
Report to CDU RN [place], patient to be moved to CDU 4.

## 2012-03-14 NOTE — ED Notes (Signed)
New and old ecg given to edp [facility]

## 2012-05-29 ENCOUNTER — Emergency Department (HOSPITAL_COMMUNITY): Payer: Medicaid Other

## 2012-05-29 ENCOUNTER — Encounter (HOSPITAL_COMMUNITY): Payer: Self-pay

## 2012-05-29 ENCOUNTER — Emergency Department (HOSPITAL_COMMUNITY)
Admission: EM | Admit: 2012-05-29 | Discharge: 2012-05-29 | Disposition: A | Discharge status: Home / Self Care | Payer: Medicaid Other | Attending: Emergency Medicine | Admitting: Emergency Medicine

## 2012-05-29 DIAGNOSIS — Z79899 Other long term (current) drug therapy: Secondary | ICD-10-CM | POA: Insufficient documentation

## 2012-05-29 DIAGNOSIS — I10 Essential (primary) hypertension: Secondary | ICD-10-CM | POA: Insufficient documentation

## 2012-05-29 DIAGNOSIS — R51 Headache: Secondary | ICD-10-CM

## 2012-05-29 DIAGNOSIS — H5710 Ocular pain, unspecified eye: Secondary | ICD-10-CM | POA: Insufficient documentation

## 2012-05-29 DIAGNOSIS — E119 Type 2 diabetes mellitus without complications: Secondary | ICD-10-CM | POA: Insufficient documentation

## 2012-05-29 DIAGNOSIS — R42 Dizziness and giddiness: Secondary | ICD-10-CM | POA: Insufficient documentation

## 2012-05-29 MED ORDER — METOCLOPRAMIDE HCL 5 MG/ML IJ SOLN
10.0000 mg | Freq: Once | INTRAMUSCULAR | Status: AC
  Administered 2012-05-29: 10 mg via INTRAVENOUS
  Filled 2012-05-29: qty 2

## 2012-05-29 MED ORDER — HYDROMORPHONE HCL PF 1 MG/ML IJ SOLN
1.0000 mg | Freq: Once | INTRAMUSCULAR | Status: AC
  Administered 2012-05-29: 1 mg via INTRAVENOUS
  Filled 2012-05-29: qty 1

## 2012-05-29 MED ORDER — SODIUM CHLORIDE 0.9 % IV BOLUS (SEPSIS)
1000.0000 mL | Freq: Once | INTRAVENOUS | Status: AC
  Administered 2012-05-29: 1000 mL via INTRAVENOUS

## 2012-05-29 MED ORDER — ONDANSETRON HCL 4 MG/2ML IJ SOLN
4.0000 mg | Freq: Once | INTRAMUSCULAR | Status: AC
  Administered 2012-05-29: 4 mg via INTRAVENOUS
  Filled 2012-05-29: qty 2

## 2012-05-29 MED ORDER — HYDROCODONE-ACETAMINOPHEN 5-500 MG PO TABS: 1.0000 | ORAL_TABLET | Freq: Four times a day (QID) | ORAL | Status: AC | PRN

## 2012-05-29 NOTE — ED Notes (Signed)
Pt from home with complaints of headache, pt has hx HTN, and brain tumor and headaches. Pt states headache is worst than normal. Pt denies any n/v/d but has dizziness and blurred vision, pt wears glasses.

## 2012-05-29 NOTE — Discharge Instructions (Signed)

## 2012-05-29 NOTE — ED Provider Notes (Signed)
History     CSN: [id number]  Arrival date & time 05/29/12  0016   First MD Initiated Contact with Patient 05/29/12 0023      Chief Complaint  Patient presents with  . Headache  . Hypertension     The history is provided by the patient.   the patient reports he developed a severe headache this evening while he was at home.  He denies weakness of his upper lower extremities.  He reports he has a history of brain tumor which they're monitoring closely with every 6 month MRI scans of his brain.  He denies nausea vomiting.  His had no fevers or chills.  He denies cough or congestion.  Denies dysuria or urinary frequency.  He has no abdominal complaints.  He denies changes in his vision.  His had no changes in his speech.  His pain is moderate at this time.  He has not tried anything for for his symptoms.  Past Medical History  Diagnosis Date  . Hypertension   . Diabetes mellitus   . Brain tumor   . CVA (cerebral vascular accident)   . Narcotic abuse     No past surgical history on file.  No family history on file.  History  Substance Use Topics  . Smoking status: Not on file  . Smokeless tobacco: Not on file  . Alcohol Use: Not on file      Review of Systems  All other systems reviewed and are negative.    Allergies  Review of patient's allergies indicates no known allergies.  Home Medications   Current Outpatient Rx  Name Route Sig Dispense Refill  . AMLODIPINE BESYLATE 10 MG PO TABS Oral Take 10 mg by mouth daily.    . ASPIRIN 81 MG PO CHEW Oral Chew 81 mg by mouth daily.    Marland Kitchen FOLIC ACID 1 MG PO TABS Oral Take 1 mg by mouth daily.    Marland Kitchen LISINOPRIL 20 MG PO TABS Oral Take 20 mg by mouth daily.    Marland Kitchen MECLIZINE HCL 25 MG PO TABS Oral Take 25 mg by mouth 3 (three) times daily as needed. For dizziness    . METFORMIN HCL 500 MG PO TABS Oral Take 500 mg by mouth 2 (two) times daily with a meal.    . OXYCODONE-ACETAMINOPHEN 5-325 MG PO TABS Oral Take 1 tablet by mouth  daily as needed. For pain    . PRAVASTATIN SODIUM 20 MG PO TABS Oral Take 20 mg by mouth at bedtime.     Bernadette Hoit SODIUM 8.6-50 MG PO TABS Oral Take 1 tablet by mouth daily.    Marland Kitchen VITAMIN B-1 250 MG PO TABS Oral Take 250 mg by mouth daily.    Marland Kitchen HYDROCODONE-ACETAMINOPHEN 5-500 MG PO TABS Oral Take 1-2 tablets by mouth every 6 (six) hours as needed for pain. 15 tablet 0    BP 118/37  Pulse 104  Temp(Src) 98.4 F (36.9 C) (Oral)  Resp 16  Ht 5\' 9"  (1.753 m)  Wt 170 lb (77.111 kg)  BMI 25.10 kg/m2  SpO2 95%  Physical Exam  Nursing note and vitals reviewed. Constitutional: He is oriented to person, place, and time. He appears well-developed and well-nourished.  HENT:  Head: Normocephalic and atraumatic.  Eyes: EOM are normal. Pupils are equal, round, and reactive to light.  Neck: Normal range of motion.       No neck stiffness or meningeal signs  Cardiovascular: Normal rate, regular rhythm, normal heart  sounds and intact distal pulses.   Pulmonary/Chest: Effort normal and breath sounds normal. No respiratory distress.  Abdominal: Soft. He exhibits no distension. There is no tenderness.  Musculoskeletal: Normal range of motion.  Neurological: He is alert and oriented to person, place, and time.       5/5 strength in major muscle groups of  bilateral upper and lower extremities. Speech normal. No facial asymetry.   Skin: Skin is warm and dry.  Psychiatric: He has a normal mood and affect. Judgment normal.    ED Course  Procedures (including critical care time)   Labs Reviewed  GLUCOSE, CAPILLARY   Ct Head Wo Contrast  05/29/2012  *RADIOLOGY REPORT*  Clinical Data: Posterior headache.  Right eye pain.  Dizziness and blurry vision.  Hypertension.  CT HEAD WITHOUT CONTRAST  Technique:  Contiguous axial images were obtained from the base of the skull through the vertex without contrast.  Comparison: 03/14/2012  Findings: Mild cerebral atrophy.  No ventricular dilatation.   No mass effect or midline shift.  No abnormal extra-axial fluid collections.  Gray-white matter junctions are distinct.  Basal cisterns are not effaced.  No evidence of acute intracranial hemorrhage.  No depressed skull fractures.  Visualized paranasal sinuses and mastoid air cells are not opacified.  No significant changes since the previous study.  IMPRESSION: No acute intracranial abnormalities.  Original Report Authenticated By: Marlon Pel, M.D.     1. Headache       MDM  The patient's headache is significantly improved.  CT scan of his head is normal.  Nonfocal neuro exam.  Discharge home in good condition.  He is a mild elevation of his temperature of 100.5 orally.  He has no infectious symptoms.  This is likely viral        Lyanne Co, MD 05/29/12 [phone]

## 2012-09-06 ENCOUNTER — Inpatient Hospital Stay (HOSPITAL_COMMUNITY)
Admission: EM | Admit: 2012-09-06 | Discharge: 2012-09-07 | DRG: 069 | Disposition: A | Discharge status: Home / Self Care | Payer: Medicaid Other | Attending: Internal Medicine | Admitting: Internal Medicine

## 2012-09-06 ENCOUNTER — Emergency Department (HOSPITAL_COMMUNITY): Payer: Medicaid Other

## 2012-09-06 DIAGNOSIS — R109 Unspecified abdominal pain: Secondary | ICD-10-CM

## 2012-09-06 DIAGNOSIS — IMO0001 Reserved for inherently not codable concepts without codable children: Secondary | ICD-10-CM | POA: Diagnosis present

## 2012-09-06 DIAGNOSIS — E119 Type 2 diabetes mellitus without complications: Secondary | ICD-10-CM | POA: Diagnosis present

## 2012-09-06 DIAGNOSIS — G459 Transient cerebral ischemic attack, unspecified: Principal | ICD-10-CM | POA: Diagnosis present

## 2012-09-06 DIAGNOSIS — R42 Dizziness and giddiness: Secondary | ICD-10-CM

## 2012-09-06 DIAGNOSIS — E1165 Type 2 diabetes mellitus with hyperglycemia: Secondary | ICD-10-CM

## 2012-09-06 DIAGNOSIS — Z8673 Personal history of transient ischemic attack (TIA), and cerebral infarction without residual deficits: Secondary | ICD-10-CM

## 2012-09-06 DIAGNOSIS — E785 Hyperlipidemia, unspecified: Secondary | ICD-10-CM | POA: Diagnosis present

## 2012-09-06 DIAGNOSIS — I10 Essential (primary) hypertension: Secondary | ICD-10-CM

## 2012-09-06 DIAGNOSIS — R531 Weakness: Secondary | ICD-10-CM

## 2012-09-06 DIAGNOSIS — Z7982 Long term (current) use of aspirin: Secondary | ICD-10-CM

## 2012-09-06 DIAGNOSIS — D353 Benign neoplasm of craniopharyngeal duct: Secondary | ICD-10-CM | POA: Diagnosis present

## 2012-09-06 DIAGNOSIS — R0789 Other chest pain: Secondary | ICD-10-CM | POA: Diagnosis present

## 2012-09-06 DIAGNOSIS — D352 Benign neoplasm of pituitary gland: Secondary | ICD-10-CM | POA: Diagnosis present

## 2012-09-06 DIAGNOSIS — F1910 Other psychoactive substance abuse, uncomplicated: Secondary | ICD-10-CM | POA: Diagnosis present

## 2012-09-06 HISTORY — DX: Transient cerebral ischemic attack, unspecified: G45.9

## 2012-09-06 HISTORY — DX: Essential (primary) hypertension: I10

## 2012-09-06 HISTORY — DX: Dizziness and giddiness: R42

## 2012-09-06 LAB — COMPREHENSIVE METABOLIC PANEL
ALT: 41 U/L (ref 0–53)
AST: 41 U/L — ABNORMAL HIGH (ref 0–37)
Albumin: 3.8 g/dL (ref 3.5–5.2)
Alkaline Phosphatase: 60 U/L (ref 39–117)
Calcium: 10.1 mg/dL (ref 8.4–10.5)
Potassium: 3.9 mEq/L (ref 3.5–5.1)
Sodium: 140 mEq/L (ref 135–145)
Total Protein: 7.8 g/dL (ref 6.0–8.3)

## 2012-09-06 LAB — RAPID URINE DRUG SCREEN, HOSP PERFORMED
Amphetamines: NOT DETECTED
Barbiturates: NOT DETECTED
Benzodiazepines: NOT DETECTED
Cocaine: NOT DETECTED
Opiates: POSITIVE — AB
Tetrahydrocannabinol: NOT DETECTED

## 2012-09-06 LAB — POCT I-STAT, CHEM 8
BUN: 10 mg/dL (ref 6–23)
Chloride: 105 mEq/L (ref 96–112)
Glucose, Bld: 97 mg/dL (ref 70–99)
HCT: 48 % (ref 39.0–52.0)
Potassium: 3.8 mEq/L (ref 3.5–5.1)

## 2012-09-06 LAB — DIFFERENTIAL
Basophils Absolute: 0 10*3/uL (ref 0.0–0.1)
Basophils Relative: 0 % (ref 0–1)
Eosinophils Absolute: 0.3 10*3/uL (ref 0.0–0.7)
Eosinophils Relative: 2 % (ref 0–5)
Lymphocytes Relative: 38 % (ref 12–46)
Lymphs Abs: 4.3 K/uL — ABNORMAL HIGH (ref 0.7–4.0)
Monocytes Absolute: 0.7 K/uL (ref 0.1–1.0)
Monocytes Relative: 6 % (ref 3–12)
Neutro Abs: 5.9 K/uL (ref 1.7–7.7)
Neutrophils Relative %: 53 % (ref 43–77)

## 2012-09-06 LAB — COMPREHENSIVE METABOLIC PANEL WITH GFR
BUN: 11 mg/dL (ref 6–23)
CO2: 27 meq/L (ref 19–32)
Chloride: 102 meq/L (ref 96–112)
Creatinine, Ser: 0.98 mg/dL (ref 0.50–1.35)
GFR calc Af Amer: >90 mL/min (ref >90)
GFR calc non Af Amer: 88 mL/min — ABNORMAL LOW (ref >90)
Glucose, Bld: 73 mg/dL (ref 70–99)
Total Bilirubin: 0.2 mg/dL — ABNORMAL LOW (ref 0.3–1.2)

## 2012-09-06 LAB — HEMOGLOBIN A1C
Hgb A1c MFr Bld: 6.3 % — ABNORMAL HIGH (ref <5.7)
Mean Plasma Glucose: 134 mg/dL — ABNORMAL HIGH (ref <117)

## 2012-09-06 LAB — URINALYSIS, ROUTINE W REFLEX MICROSCOPIC
Bilirubin Urine: NEGATIVE
Hgb urine dipstick: NEGATIVE
Ketones, ur: NEGATIVE mg/dL
Nitrite: NEGATIVE
Urobilinogen, UA: 1 mg/dL (ref 0.0–1.0)

## 2012-09-06 LAB — TROPONIN I: Troponin I: <0.3 ng/mL (ref <0.30)

## 2012-09-06 LAB — CBC
HCT: 42.5 % (ref 39.0–52.0)
Hemoglobin: 13.9 g/dL (ref 13.0–17.0)
MCH: 24.5 pg — ABNORMAL LOW (ref 26.0–34.0)
MCHC: 32.7 g/dL (ref 30.0–36.0)
MCV: 75 fL — ABNORMAL LOW (ref 78.0–100.0)
Platelets: 211 10*3/uL (ref 150–400)
RBC: 5.67 MIL/uL (ref 4.22–5.81)
RDW: 14.7 % (ref 11.5–15.5)
WBC: 11.2 10*3/uL — ABNORMAL HIGH (ref 4.0–10.5)

## 2012-09-06 LAB — PROTIME-INR
INR: 1.02 (ref 0.00–1.49)
Prothrombin Time: 13.6 seconds (ref 11.6–15.2)

## 2012-09-06 LAB — APTT: aPTT: 35 s (ref 24–37)

## 2012-09-06 LAB — URINE MICROSCOPIC-ADD ON

## 2012-09-06 MED ORDER — ENOXAPARIN SODIUM 40 MG/0.4ML ~~LOC~~ SOLN
40.0000 mg | SUBCUTANEOUS | Status: DC
  Administered 2012-09-06 – 2012-09-07 (×2): 40 mg via SUBCUTANEOUS
  Filled 2012-09-06 (×2): qty 0.4

## 2012-09-06 MED ORDER — OXYCODONE HCL 5 MG PO TABS
5.0000 mg | ORAL_TABLET | ORAL | Status: DC | PRN
  Administered 2012-09-06 – 2012-09-07 (×4): 5 mg via ORAL
  Filled 2012-09-06 (×4): qty 1

## 2012-09-06 MED ORDER — INSULIN ASPART 100 UNIT/ML ~~LOC~~ SOLN: 0.0000 [IU] | Freq: Every day | SUBCUTANEOUS | Status: DC

## 2012-09-06 MED ORDER — KETOROLAC TROMETHAMINE 30 MG/ML IJ SOLN
30.0000 mg | Freq: Once | INTRAMUSCULAR | Status: AC
  Administered 2012-09-06: 30 mg via INTRAVENOUS
  Filled 2012-09-06: qty 1

## 2012-09-06 MED ORDER — LISINOPRIL 20 MG PO TABS
20.0000 mg | ORAL_TABLET | Freq: Every day | ORAL | Status: DC
  Administered 2012-09-06 – 2012-09-07 (×2): 20 mg via ORAL
  Filled 2012-09-06 (×2): qty 1

## 2012-09-06 MED ORDER — FOLIC ACID 1 MG PO TABS
1.0000 mg | ORAL_TABLET | Freq: Every day | ORAL | Status: DC
  Administered 2012-09-06 – 2012-09-07 (×2): 1 mg via ORAL
  Filled 2012-09-06 (×2): qty 1

## 2012-09-06 MED ORDER — SODIUM CHLORIDE 0.9 % IJ SOLN: 3.0000 mL | Freq: Two times a day (BID) | INTRAMUSCULAR | Status: DC

## 2012-09-06 MED ORDER — HYDROCHLOROTHIAZIDE 12.5 MG PO CAPS
12.5000 mg | ORAL_CAPSULE | Freq: Every day | ORAL | Status: DC
  Administered 2012-09-06 – 2012-09-07 (×2): 12.5 mg via ORAL
  Filled 2012-09-06 (×2): qty 1

## 2012-09-06 MED ORDER — INSULIN ASPART 100 UNIT/ML ~~LOC~~ SOLN: 0.0000 [IU] | Freq: Three times a day (TID) | SUBCUTANEOUS | Status: DC

## 2012-09-06 MED ORDER — SODIUM CHLORIDE 0.9 % IV SOLN: INTRAVENOUS | Status: DC

## 2012-09-06 MED ORDER — FENTANYL CITRATE 0.05 MG/ML IJ SOLN: 50.0000 ug | INTRAMUSCULAR | Status: DC | PRN

## 2012-09-06 MED ORDER — HYDROCHLOROTHIAZIDE 25 MG PO TABS
12.5000 mg | ORAL_TABLET | Freq: Every day | ORAL | Status: DC
  Filled 2012-09-06: qty 0.5

## 2012-09-06 MED ORDER — SENNOSIDES-DOCUSATE SODIUM 8.6-50 MG PO TABS
1.0000 | ORAL_TABLET | Freq: Every day | ORAL | Status: DC
  Administered 2012-09-06 – 2012-09-07 (×2): 1 via ORAL
  Filled 2012-09-06: qty 1

## 2012-09-06 MED ORDER — SODIUM CHLORIDE 0.9 % IV SOLN: 250.0000 mL | INTRAVENOUS | Status: DC | PRN

## 2012-09-06 MED ORDER — SIMVASTATIN 10 MG PO TABS
10.0000 mg | ORAL_TABLET | Freq: Every day | ORAL | Status: DC
  Administered 2012-09-06: 10 mg via ORAL
  Filled 2012-09-06 (×2): qty 1

## 2012-09-06 MED ORDER — SODIUM CHLORIDE 0.9 % IJ SOLN: 3.0000 mL | INTRAMUSCULAR | Status: DC | PRN

## 2012-09-06 MED ORDER — VITAMIN B-1 50 MG PO TABS
250.0000 mg | ORAL_TABLET | Freq: Every day | ORAL | Status: DC
  Administered 2012-09-06 – 2012-09-07 (×2): 250 mg via ORAL
  Filled 2012-09-06 (×2): qty 1

## 2012-09-06 MED ORDER — FENTANYL CITRATE 0.05 MG/ML IJ SOLN
100.0000 ug | Freq: Once | INTRAMUSCULAR | Status: AC
  Administered 2012-09-06: 100 ug via INTRAVENOUS
  Filled 2012-09-06: qty 2

## 2012-09-06 MED ORDER — METOPROLOL TARTRATE 1 MG/ML IV SOLN
5.0000 mg | Freq: Three times a day (TID) | INTRAVENOUS | Status: DC | PRN
  Filled 2012-09-06: qty 5

## 2012-09-06 MED ORDER — SODIUM CHLORIDE 0.9 % IJ SOLN
3.0000 mL | Freq: Two times a day (BID) | INTRAMUSCULAR | Status: DC
  Administered 2012-09-06 – 2012-09-07 (×3): 3 mL via INTRAVENOUS

## 2012-09-06 MED ORDER — NICOTINE 14 MG/24HR TD PT24
14.0000 mg | MEDICATED_PATCH | Freq: Every day | TRANSDERMAL | Status: DC
  Administered 2012-09-06 – 2012-09-07 (×2): 14 mg via TRANSDERMAL
  Filled 2012-09-06 (×2): qty 1

## 2012-09-06 MED ORDER — AMLODIPINE BESYLATE 10 MG PO TABS
10.0000 mg | ORAL_TABLET | Freq: Every day | ORAL | Status: DC
  Administered 2012-09-06 – 2012-09-07 (×2): 10 mg via ORAL
  Filled 2012-09-06 (×2): qty 1

## 2012-09-06 MED ORDER — INSULIN GLARGINE 100 UNIT/ML ~~LOC~~ SOLN
10.0000 [IU] | Freq: Every day | SUBCUTANEOUS | Status: DC
  Administered 2012-09-06: 10 [IU] via SUBCUTANEOUS

## 2012-09-06 MED ORDER — LORAZEPAM 2 MG/ML IJ SOLN
1.0000 mg | Freq: Once | INTRAMUSCULAR | Status: AC
  Administered 2012-09-07: 1 mg via INTRAVENOUS
  Filled 2012-09-06: qty 1

## 2012-09-06 NOTE — H&P (Signed)
Hospital Admission Note Date: 09/06/2012  Patient name: [name] Medical record number: [id number] Date of birth: 1951/02/10 Age: 61 y.o. Gender: male PCP: Dorrene German, MD  Attending physician: Altha Harm, MD  Chief Complaint: Dizziness upon awakening  History of Present Illness: Patient is a 61 year old gentleman with a history of pituitary adenoma, diabetes, hypertension and previous CVA who awoke this morning with feeling of pain in his left side. Patient states that he also had dizziness upon standing up which prompted him coming to the emergency room. The patient states that in the laying position he has no dizziness but is most worrisome sign is a dizziness that occurs when he stands. He denies any chest pain, headache, blurring of vision, shortness of breath, nausea, vomiting or diarrhea.  Scheduled Meds:   . sodium chloride   Intravenous STAT  . fentaNYL  100 mcg Intravenous Once  . ketorolac  30 mg Intravenous Once   Continuous Infusions:  PRN Meds:.fentaNYL Allergies: Review of patient's allergies indicates no known allergies. Past Medical History  Diagnosis Date  . Hypertension   . Diabetes mellitus   . Brain tumor   . CVA (cerebral vascular accident)   . Narcotic abuse    No past surgical history on file. No family history on file. History   Social History  . Marital Status: Legally Separated    Spouse Name: N/A    Number of Children: N/A  . Years of Education: N/A   Occupational History  . Not on file.   Social History Main Topics  . Smoking status: Not on file  . Smokeless tobacco: Not on file  . Alcohol Use: Not on file  . Drug Use: Not on file  . Sexually Active: Not on file   Other Topics Concern  . Not on file   Social History Narrative  . No narrative on file   Review of Systems: A comprehensive review of systems was negative except as noted in the history of present illness Physical Exam:  Intake/Output Summary (Last 24  hours) at 09/06/12 1037 Last data filed at 09/06/12 0741  Gross per 24 hour  Intake      0 ml  Output    501 ml  Net   -501 ml   General: Alert, awake, oriented x3, in no acute distress.  HEENT: [place]/AT PEERL, EOMI Neck: Trachea midline,  no masses, no thyromegal,y no JVD, no carotid bruit OROPHARYNX:  Moist, No exudate/ erythema/lesions.  Heart: Regular rate and rhythm.  Lungs: Clear to auscultation Abdomen: Soft, nontender, nondistended, positive bowel sounds, no masses no hepatosplenomegaly noted..  Neuro: No focal neurological deficits noted cranial nerves II through XII grossly intact. DTRs 2+ bilaterally upper and lower extremities. Strength equal and symmetrical in bilateral upper and lower extremities. Musculoskeletal: No warm swelling or erythema around joints, no spinal tenderness noted. Psychiatric: Patient alert and oriented x3, good insight and cognition, good recent to remote recall.  Lab results:  [facility] 09/06/12 0634 09/06/12 0332  NA 140 142  K 3.9 3.8  CL 102 105  CO2 27 --  GLUCOSE 73 97  BUN 11 10  CREATININE 0.98 1.00  CALCIUM 10.1 --  MG -- --  PHOS -- --    Basename 09/06/12 0634  AST 41*  ALT 41  ALKPHOS 60  BILITOT 0.2*  PROT 7.8  ALBUMIN 3.8   No results found for this basename: LIPASE:2,AMYLASE:2 in the last 72 hours  Basename 09/06/12 0634 09/06/12 0332  WBC  11.2* --  NEUTROABS 5.9 --  HGB 13.9 16.3  HCT 42.5 48.0  MCV 75.0* --  PLT 211 --    Basename 09/06/12 0450  CKTOTAL --  CKMB --  CKMBINDEX --  TROPONINI <0.30   No components found with this basename: POCBNP:3 No results found for this basename: DDIMER:2 in the last 72 hours No results found for this basename: HGBA1C:2 in the last 72 hours No results found for this basename: CHOL:2,HDL:2,LDLCALC:2,TRIG:2,CHOLHDL:2,LDLDIRECT:2 in the last 72 hours No results found for this basename: TSH,T4TOTAL,FREET3,T3FREE,THYROIDAB in the last 72 hours No results found for this  basename: VITAMINB12:2,FOLATE:2,FERRITIN:2,TIBC:2,IRON:2,RETICCTPCT:2 in the last 72 hours Imaging results:  Dg Chest 2 View  09/06/2012  *RADIOLOGY REPORT*  Clinical Data: Shortness of breath.  Pain under the left arm.  CHEST - 2 VIEW  Comparison: 06/16/2010  Findings: Normal heart size and pulmonary vascularity.  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 Calcified granulomas.  No focal airspace consolidation.  No blunting of costophrenic angles.  No pneumothorax.  Mediastinal contours appear intact.  No significant change since previous study.  IMPRESSION: No evidence of active pulmonary disease.   Original Report Authenticated By: Marlon Pel, M.D.    Ct Head Wo Contrast  09/06/2012  *RADIOLOGY REPORT*  Clinical Data: Code stroke.  Generalized weakness.  CT HEAD WITHOUT CONTRAST  Technique:  Contiguous axial images were obtained from the base of the skull through the vertex without contrast.  Comparison: 05/29/2012.  Findings: The ventricles and sulci are symmetrical without significant effacement, displacement, or dilatation. No mass effect or midline shift. No abnormal extra-axial fluid collections. The grey-white matter junction is distinct. Basal cisterns are not effaced. No acute intracranial hemorrhage. No depressed skull fractures.  Visualized paranasal sinuses and mastoid air cells are not opacified.  No significant changes since the previous study.  IMPRESSION: No acute intracranial abnormalities.  Code stroke results were telephoned to Dr. Oletta Lamas at the time of dictation, 0316 hours on 09/06/2012.   Original Report Authenticated By: Marlon Pel, M.D.    Other results: EKG: normal sinus rhythm.   Patient Active Hospital Problem List: Vertigo (09/06/2012)   Assessment: Patient presents with complaints that were consistent with vertigo. He does not have any dysesthesias, paresthesias or weakness reported on the left side. I will check orthostatic vital signs on  the patient and proceed with an MRI to further evaluate the pituitary adenoma and structures surrounding it. Or for any further intracranial abnormalities.    HTN (hypertension), malignant (09/06/2012)   Assessment: Blood pressures significantly elevated. We'll resume home medications and add hydrochlorothiazide 12.5 mg by mouth daily.   Diabetes mellitus type 2, uncontrolled (09/06/2012)   Assessment: We'll treat with Lantus and sliding scale tilt testing completed in the event that the patient may have to use contrast for any studies.    Pain in the side (09/06/2012)   Assessment: Treat symptomatically.     Total time spent with the patient in examination assessment decision making greater than 30 minutes   Gustavus Haskin A. 09/06/2012, 10:37 AM

## 2012-09-06 NOTE — Progress Notes (Signed)
61 yo male-h/o TIA/CVA, HTN, DM-coming in with pain/left ext numbness. CT Head neg, already seen by Neuro-and not considered a TPA candidate. Needs to be admitted for further w/u and evaluation. ED to request Neuro Tele.

## 2012-09-06 NOTE — Code Documentation (Signed)
61yo bm brought in via [facility] for onset of Lt side weakness & pain.  Pt states he went to bed around 10pm last evening with pain on Lt side & awoke with weakness on Lt.  Code stroke called 0305, pt arrival 0305, EDP exam 0305, stroke team arrival 0305, LSN 2200, pt arrival in CT 0306, phlebotomist arrival 0330, code stroke cancelled 0330. Pt not a tPA candidate due to out of window.

## 2012-09-06 NOTE — ED Notes (Signed)
Assumed care of pt on return from CT.  Pt states he went to bed at 2200hr 9.8.13 and woke around 0100 with left side pain from left armpit to  Toes.  Pain persists 8/10.  Denies CP.  states occasional SOB.  States HA, generalized rated 6/10.  Dr Roseanne Reno at bedside.  Placed on cardiac monitor in SB 56.

## 2012-09-06 NOTE — ED Notes (Signed)
Breakfast ordered for pt.  Coffee given to pt.  Voices no complaints.  Pain free after Fentanyl.

## 2012-09-06 NOTE — ED Notes (Addendum)
EDP notified of pain request for pain medication

## 2012-09-06 NOTE — ED Notes (Signed)
Pt arrived with reported Left arm and leg pain with reported onset at 2200 per patient and EMS report seen at bridge by Dr Oletta Lamas and code stroke called

## 2012-09-06 NOTE — ED Provider Notes (Addendum)
History     CSN: [id number]  Arrival date & time 09/06/12  0309   First MD Initiated Contact with Patient 09/06/12 [phone]      Chief Complaint  Patient presents with  . Code Stroke    Left side weakness arrived at 0305    (Consider location/radiation/quality/duration/timing/severity/associated sxs/prior treatment) HPI Comments: Pt reports going to bed at 10 PM last night feeling well.  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  He has a remote h/o heroin and alcohol abuse, he reprots he is clean.  He rerpoted to EMS he had had a stroke before.  I have reviwed all of his prior MRI's of brain, no CVA ever identified.  A consult note by hospitalist states he had a remote stroke, but I see no objective evidence of this and no recent CT or MRI shows evidence of old stroke.  He reports he was woken with pain in left armpit and went down to left side and leg.  EMS reports he was not weak, ambulated to stretcher at scene.  His BP was well over 200 systolic on scene.  Upon arrival, pt displays weakness to left arm.  Code CVA initially called since time seen normal was still within 5 hours.  Pt seen by Dr. Roseanne Reno and cancelled.  Pt denies SOB, coughing, vomiting, rash.  No recent injury, didn't lift anything heavy.  Reports deep breath doesn't make worse.  Movement and palpation of area in armpit and upper back makes worse.    The history is provided by the patient and the EMS personnel.    Past Medical History  Diagnosis Date  . Hypertension   . Diabetes mellitus   . Brain tumor   . CVA (cerebral vascular accident)   . Narcotic abuse     No past surgical history on file.  No family history on file.  History  Substance Use Topics  . Smoking status: Not on file  . Smokeless tobacco: Not on file  . Alcohol Use: Not on file      Review of Systems  Constitutional: Negative for fever and chills.  HENT: Negative  for congestion and rhinorrhea.   Respiratory: Negative for cough and shortness of breath.   Cardiovascular: Positive for chest pain.  Gastrointestinal: Negative for nausea, vomiting, abdominal pain and diarrhea.  Genitourinary: Positive for flank pain. Negative for dysuria.  Musculoskeletal: Positive for back pain and arthralgias. Negative for gait problem.  Skin: Negative for rash.  Neurological: Positive for weakness. Negative for dizziness and numbness.  All other systems reviewed and are negative.    Allergies  Review of patient's allergies indicates no known allergies.  Home Medications   Current Outpatient Rx  Name Route Sig Dispense Refill  . AMLODIPINE BESYLATE 10 MG PO TABS Oral Take 10 mg by mouth daily.    . ASPIRIN 81 MG PO CHEW Oral Chew 81 mg by mouth daily.    Marland Kitchen FOLIC ACID 1 MG PO TABS Oral Take 1 mg by mouth daily.    Marland Kitchen LISINOPRIL 20 MG PO TABS Oral Take 20 mg by mouth daily.    Marland Kitchen METFORMIN HCL 500 MG PO TABS Oral Take 500 mg by mouth 2 (two) times daily with a meal.    . OXYCODONE-ACETAMINOPHEN 5-325 MG PO TABS Oral Take 1 tablet by mouth daily as needed. For pain    . PRAVASTATIN SODIUM 20 MG  PO TABS Oral Take 20 mg by mouth at bedtime.     Bernadette Hoit SODIUM 8.6-50 MG PO TABS Oral Take 1 tablet by mouth daily.    Marland Kitchen VITAMIN B-1 250 MG PO TABS Oral Take 250 mg by mouth daily.      BP 173/65  Pulse 50  Temp 98.2 F (36.8 C) (Oral)  Resp 18  SpO2 98%  Physical Exam  Nursing note and vitals reviewed. Constitutional: He is oriented to person, place, and time. He appears well-developed and well-nourished.  HENT:  Head: Normocephalic and atraumatic.  Eyes: Pupils are equal, round, and reactive to light. No scleral icterus.  Neck: Normal range of motion.  Cardiovascular: Normal rate and regular rhythm.   No murmur heard. Pulmonary/Chest: Effort normal and breath sounds normal. No respiratory distress.   He exhibits tenderness.    Abdominal: Soft.  He exhibits no distension. There is no tenderness. There is no rebound.  Neurological: He is alert and oriented to person, place, and time. No cranial nerve deficit.  Skin: Skin is warm and dry. No rash noted. No erythema. No pallor.  Psychiatric: He has a normal mood and affect.    ED Course  Procedures (including critical care time)  Labs Reviewed  URINE RAPID DRUG SCREEN (HOSP PERFORMED) - Abnormal; Notable for the following:    Opiates POSITIVE (*)     All other components within normal limits  POCT I-STAT, CHEM 8  TROPONIN I  PROTIME-INR  APTT  CBC  DIFFERENTIAL  COMPREHENSIVE METABOLIC PANEL   Dg Chest 2 View  09/06/2012  *RADIOLOGY REPORT*  Clinical Data: Shortness of breath.  Pain under the left arm.  CHEST - 2 VIEW  Comparison: 06/16/2010  Findings: Normal heart size and pulmonary vascularity.  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 Calcified granulomas.  No focal airspace consolidation.  No blunting of costophrenic angles.  No pneumothorax.  Mediastinal contours appear intact.  No significant change since previous study.  IMPRESSION: No evidence of active pulmonary disease.   Original Report Authenticated By: Marlon Pel, M.D.    Ct Head Wo Contrast  09/06/2012  *RADIOLOGY REPORT*  Clinical Data: Code stroke.  Generalized weakness.  CT HEAD WITHOUT CONTRAST  Technique:  Contiguous axial images were obtained from the base of the skull through the vertex without contrast.  Comparison: 05/29/2012.  Findings: The ventricles and sulci are symmetrical without significant effacement, displacement, or dilatation. No mass effect or midline shift. No abnormal extra-axial fluid collections. The grey-white matter junction is distinct. Basal cisterns are not effaced. No acute intracranial hemorrhage. No depressed skull fractures.  Visualized paranasal sinuses and mastoid air cells are not opacified.  No significant changes since the previous study.  IMPRESSION: No  acute intracranial abnormalities.  Code stroke results were telephoned to Dr. Oletta Lamas at the time of dictation, 0316 hours on 09/06/2012.   Original Report Authenticated By: Marlon Pel, M.D.    I reviewed films myself.    1. Flank pain   2. Hypertension   3. Weakness     6:17 AM Pt has opiates in urine drug screen.  In looking up pt's name and DOB on drug database, no entries are found.    6:27 AM Pt's BP is down to 155/64, pt is resting comfortably, able t move arm now, reprots feeling much better.  Dr. Marca Ancona consultation note suggests admission for monitoring and MRI.    MDM  Code stroke called and cancelled, seen by  Dr. Roseanne Reno.  I reviewed multiple prior records, no prior stroke.  He insists no drug use, has not used percocet in about 1 month.  Lungs clear, no rash.  Pain goes from axilla and upper left back down flank and into hip, and thigh.  Pt reports no injury.  He has HTN.  I would consider an aortic issue, but pain is clearly reproducible with light palpation.  Will check troponin, get PA and lat CXR, drug screen.  Will give toradol for now until after drug screen received.         Gavin Pound. Oletta Lamas, MD 09/06/12 1610  Gavin Pound. Oletta Lamas, MD 09/06/12 [id number]  Gavin Pound. Oletta Lamas, MD 09/06/12 [phone]

## 2012-09-06 NOTE — Consult Note (Signed)
Referring Physician: Dr. Oletta Lamas    Chief Complaint: Pain and numbness involving left extremities as well as weakness.  HPI: [name] is an 61 y.o. male with a history of TIA and possible stroke, diabetes mellitus, hypertension and substance abuse, presenting with acute numbness and pain involving left arm, trunk and lower extremity as well as increased weakness and numbness. Patient was last known normal at 10 PM on 09/05/2012. CT scan showed no acute abnormality. Patient has been taking aspirin 81 mg per day. NIH stroke score was 4. Patient was beyond time window for treatment consideration with TPA, in addition to having mild and somewhat equivocal actual deficits.  LSN: 10 PM on 09/05/2012 tPA Given: No: As indicated above. MRankin: 1  Past Medical History  Diagnosis Date  . Hypertension   . Diabetes mellitus   . Brain tumor   . CVA (cerebral vascular accident)   . Narcotic abuse     No family history on file.   Medications:  Prior to Admission: Norvasc 10 mg per day  Aspirin 81 mg per day Folic acid 1 mg per day Lisinopril 20 mg per day metformin 500 mg twice a day Percocet one tablet per day when necessary pain Pravachol 20 mg per day Senokot-S1 tablet daily Thiamine 2 and 50 mg per day  Physical Examination: Blood pressure 191/83, pulse 52, resp. rate 16, SpO2 99.00%.  Neurologic Examination: Mental Status: Alert, oriented, thought content appropriate.  Speech fluent without evidence of aphasia. Able to follow commands without difficulty. Cranial Nerves: II-Visual fields were normal. III/IV/VI-Pupils were equal and reacted. Extraocular movements were full and conjugate.    V/VII-slight left facial numbness; no facial weakness. VIII-normal. X-normal speech. XII-midline tongue extension Motor: 5/5 bilaterally with normal tone and bulk except for equivocal mild weakness proximally involving left lower extremity versus guarding secondary to pain with  movement. Sensory: Reduced perception of tactile sensation over left extremities compared to right extremities. Deep Tendon Reflexes: 2+ and symmetric. Plantars: Flexor bilaterally Cerebellar: Equivocal slight impairment of finger to nose testing on the left. Carotid auscultation: Normal   Ct Head Wo Contrast  09/06/2012  *RADIOLOGY REPORT*  Clinical Data: Code stroke.  Generalized weakness.  CT HEAD WITHOUT CONTRAST  Technique:  Contiguous axial images were obtained from the base of the skull through the vertex without contrast.  Comparison: 05/29/2012.  Findings: The ventricles and sulci are symmetrical without significant effacement, displacement, or dilatation. No mass effect or midline shift. No abnormal extra-axial fluid collections. The grey-white matter junction is distinct. Basal cisterns are not effaced. No acute intracranial hemorrhage. No depressed skull fractures.  Visualized paranasal sinuses and mastoid air cells are not opacified.  No significant changes since the previous study.  IMPRESSION: No acute intracranial abnormalities.  Code stroke results were telephoned to Dr. Oletta Lamas at the time of dictation, 0316 hours on 09/06/2012.   Original Report Authenticated By: Marlon Pel, M.D.     Assessment: 61 y.o. male presenting with possible recurrent right subcortical transient ischemic attack. Subcortical stroke cannot be ruled out as well as point.  Stroke Risk Factors - diabetes mellitus, hyperlipidemia and hypertension  Plan: 1. HgbA1c, fasting lipid panel 2. MRI, MRA  of the brain without contrast 3. PT consult, OT consult, Speech consult 4. Echocardiogram 5. Carotid dopplers 6. Prophylactic therapy-Antiplatelet med: Plavix 75 mg per day 7. Risk factor modification 8. Telemetry monitoring  C.R. Roseanne Reno, MD [facility] [phone]  09/06/2012, 4:05 AM

## 2012-09-07 ENCOUNTER — Encounter (HOSPITAL_COMMUNITY): Payer: Self-pay | Admitting: *Deleted

## 2012-09-07 ENCOUNTER — Inpatient Hospital Stay (HOSPITAL_COMMUNITY): Payer: Medicaid Other

## 2012-09-07 DIAGNOSIS — D353 Benign neoplasm of craniopharyngeal duct: Secondary | ICD-10-CM

## 2012-09-07 LAB — BASIC METABOLIC PANEL
BUN: 13 mg/dL (ref 6–23)
Creatinine, Ser: 0.88 mg/dL (ref 0.50–1.35)
GFR calc Af Amer: >90 mL/min (ref >90)
GFR calc non Af Amer: >90 mL/min (ref >90)
Glucose, Bld: 127 mg/dL — ABNORMAL HIGH (ref 70–99)

## 2012-09-07 LAB — LIPID PANEL
Cholesterol: 197 mg/dL (ref 0–200)
HDL: 57 mg/dL (ref >39)
Total CHOL/HDL Ratio: 3.5 RATIO
VLDL: 24 mg/dL (ref 0–40)

## 2012-09-07 LAB — GLUCOSE, CAPILLARY: Glucose-Capillary: 123 mg/dL — ABNORMAL HIGH (ref 70–99)

## 2012-09-07 MED ORDER — HYDROCHLOROTHIAZIDE 12.5 MG PO CAPS: 12.5000 mg | ORAL_CAPSULE | Freq: Every day | ORAL | Status: DC

## 2012-09-07 MED ORDER — MECLIZINE HCL 25 MG PO TABS: 25.0000 mg | ORAL_TABLET | Freq: Three times a day (TID) | ORAL | Status: AC | PRN

## 2012-09-07 MED ORDER — ATORVASTATIN CALCIUM 10 MG PO TABS: 10.0000 mg | ORAL_TABLET | Freq: Every day | ORAL | Status: DC

## 2012-09-07 NOTE — Care Management Note (Unsigned)
    Page 1 of 1   09/07/2012     1:32:20 PM   CARE MANAGEMENT NOTE 09/07/2012  Patient:  [name], [name]   Account Number:  [id number]  Date Initiated:  09/07/2012  Documentation initiated by:  [facility]  Subjective/Objective Assessment:   Admitted with dizziness. Lives alone, uses cane and rolling walker.     Action/Plan:   PT/OT evals   Anticipated DC Date:  09/09/2012   Anticipated DC Plan:  HOME/SELF CARE         Choice offered to / List presented to:             Status of service:  In process, will continue to follow Medicare Important Message given?   (If response is "NO", the following Medicare IM given date fields will be blank) Date Medicare IM given:   Date Additional Medicare IM given:    Discharge Disposition:    Per UR Regulation:  Reviewed for med. necessity/level of care/duration of stay  If discussed at Long Length of Stay Meetings, dates discussed:    Comments:  09/07/12 Spoke with patient regarding his concern about paying for his medications. I verified with the patient that he has Medicaid and gets his medications through Medicaid. He stated that his meds cost 25$ when he gets all of them at one time. I explained to Mr. Tillman that the hospital does not have an assistance programs for patients who have Medicaid due to their costs are the lowest available. Patient states that he lives alone and uses a cane or rolling walker. Will continue to follow for d/c needs. Jacquelynn Cree RN, BSN, CCM

## 2012-09-07 NOTE — Progress Notes (Signed)
Stroke Team Progress Note  HISTORY [name] is an 61 y.o. male with a history of TIA and possible stroke, diabetes mellitus, hypertension and substance abuse, presenting with acute numbness and pain involving left arm, trunk and lower extremity as well as increased weakness and numbness. Patient was last known normal at 10 PM on 09/05/2012. CT scan showed no acute abnormality. Patient has been taking aspirin 81 mg per day. NIH stroke score was 4. Patient was beyond time window for treatment consideration with TPA, in addition to having mild and somewhat equivocal actual deficits. He was admitted for further evaluation and treatment.  SUBJECTIVE Overall he feels his condition is gradually improving.   OBJECTIVE Most recent Vital Signs: Filed Vitals:   09/07/12 0600 09/07/12 0605 09/07/12 0608 09/07/12 1000  BP: 179/73 163/78 146/72 173/77  Pulse: 52 58 62 62  Temp: 97.8 F (36.6 C)   98.3 F (36.8 C)  TempSrc:    Oral  Resp: 18   18  Height:      Weight:      SpO2: 100%   100%   CBG (last 3)   Basename 09/07/12 0746 09/06/12 2256  GLUCAP 108* 131*   Intake/Output from previous day: 09/09 0701 - 09/10 0700 In: 363 [P.O.:360; I.V.:3] Out: 1200 [Urine:1200]  IV Fluid Intake:     MEDICATIONS    . amLODipine  10 mg Oral Daily  . enoxaparin (LOVENOX) injection  40 mg Subcutaneous Q24H  . folic acid  1 mg Oral Daily  . hydrochlorothiazide  12.5 mg Oral Daily  . insulin aspart  0-15 Units Subcutaneous TID WC  . insulin aspart  0-5 Units Subcutaneous QHS  . insulin glargine  10 Units Subcutaneous QHS  . lisinopril  20 mg Oral Daily  . LORazepam  1 mg Intravenous Once  . nicotine  14 mg Transdermal Daily  . senna-docusate  1 tablet Oral Daily  . simvastatin  10 mg Oral q1800  . sodium chloride  3 mL Intravenous Q12H  . sodium chloride  3 mL Intravenous Q12H  . vitamin B-1  250 mg Oral Daily  . DISCONTD: sodium chloride   Intravenous STAT  . DISCONTD:  hydrochlorothiazide  12.5 mg Oral Daily   PRN:  sodium chloride, metoprolol, oxyCODONE, sodium chloride, DISCONTD: fentaNYL  Diet:  Carb Control thin liquids Activity:  Up with assistance DVT Prophylaxis:  Lovenox 40 mg sq daily   CLINICALLY SIGNIFICANT STUDIES Basic Metabolic Panel:  Lab 09/07/12 4098 09/06/12 0634  NA 140 140  K 3.6 3.9  CL 101 102  CO2 26 27  GLUCOSE 127* 73  BUN 13 11  CREATININE 0.88 0.98  CALCIUM 10.1 10.1  MG -- --  PHOS -- --   Liver Function Tests:  Lab 09/06/12 0634  AST 41*  ALT 41  ALKPHOS 60  BILITOT 0.2*  PROT 7.8  ALBUMIN 3.8   CBC:  Lab 09/06/12 0634 09/06/12 0332  WBC 11.2* --  NEUTROABS 5.9 --  HGB 13.9 16.3  HCT 42.5 48.0  MCV 75.0* --  PLT 211 --   Coagulation:  Lab 09/06/12 0634  LABPROT 13.6  INR 1.02   Cardiac Enzymes:  Lab 09/06/12 0450  CKTOTAL --  CKMB --  CKMBINDEX --  TROPONINI <0.30   Urinalysis:  Lab 09/06/12 1945  COLORURINE YELLOW  LABSPEC 1.012  PHURINE 7.5  GLUCOSEU NEGATIVE  HGBUR NEGATIVE  BILIRUBINUR NEGATIVE  KETONESUR NEGATIVE  PROTEINUR NEGATIVE  UROBILINOGEN 1.0  NITRITE NEGATIVE  LEUKOCYTESUR MODERATE*   Lipid Panel    Component Value Date/Time   CHOL 197 09/07/2012 0505   TRIG 121 09/07/2012 0505   HDL 57 09/07/2012 0505   CHOLHDL 3.5 09/07/2012 0505   VLDL 24 09/07/2012 0505   LDLCALC 116* 09/07/2012 0505   HgbA1C  Lab Results  Component Value Date   HGBA1C 6.3* 09/06/2012    Urine Drug Screen:     Component Value Date/Time   LABOPIA POSITIVE* 09/06/2012 0501   LABOPIA  Value: POSITIVE (NOTE) Result repeated and verified. Sent for confirmatory testing* 11/21/2008 1526   COCAINSCRNUR NONE DETECTED 09/06/2012 0501   COCAINSCRNUR NEGATIVE 11/21/2008 1526   LABBENZ NONE DETECTED 09/06/2012 0501   LABBENZ NEGATIVE 11/21/2008 1526   AMPHETMU NONE DETECTED 09/06/2012 0501   AMPHETMU NEGATIVE 11/21/2008 1526   THCU NONE DETECTED 09/06/2012 0501   LABBARB NONE DETECTED 09/06/2012 0501      Alcohol Level: No results found for this basename: ETH:2 in the last 168 hours  CT of the brain  No acute intracranial abnormalities.    MRI of the brain   No acute intracranial abnormality.  Noncontrast imaging demonstrates persistent intrasellar lesion consistent with pituitary adenoma.  Correlation with prior study is hampered by lack of a dedicated pituitary imaging and lack of IV contrast, but overall the lesion appears smaller.  Moderate nasopharyngeal adenoidal hypertrophy, nonspecific finding. Correlate clinically.  MRA of the brain    2D Echocardiogram    Carotid Doppler    CXR  No evidence of active pulmonary disease.    EKG  normal sinus rhythm.   Therapy Recommendations PT - ; OT - ; ST -   Physical Exam   Middle aged african [place] male not in distress.Awake alert. Afebrile. Head is nontraumatic. Neck is supple without bruit. Hearing is normal. Cardiac exam no murmur or gallop. Lungs are clear to auscultation. Distal pulses are well felt.   Neurogical exam :Awake alert oriented x 3 normal speech and language..No face asymmetry r weakness.. Tongue midline. No drift. Mild diminished fine finger movements on left. Orbits right over left upper extremity. Mild left grip [employer] left leg weakness.Marland KitchenPoor effort due to subjective effort...subjective decreased left hemibodyl sensation . Normal coordination.  ASSESSMENT Mr. [name] is a 61 y.o. male who woke with pain on his left side as well as dizziness when standing. MRI shows no acute stroke. Old neuro deficits from previous stroke exacerbated in setting of musculoskeletal pain. Dr. Wynetta Emery follows meningioma.   Hospital day # 1  TREATMENT/PLAN  Continue aspirin 81 mg orally every day for secondary stroke prevention.  No further stroke workup indicated Stroke Service will sign off. Follow up with Dr. Wynetta Emery for meningioma. No stroke follow up necessary.  Annie Main, MSN, RN, ANVP-BC, ANP-BC, [facility] Pager: [phone] 09/07/2012 10:36 AM  Scribe for Dr. Delia Heady, Stroke Center Medical Director, who has personally reviewed chart, pertinent data, examined the patient and developed the plan of care. Pager:  [phone]

## 2012-09-07 NOTE — Discharge Summary (Signed)
[name] MRN: [id number] DOB/AGE: May 22, 1951 61 y.o.  Admit date: 09/06/2012 Discharge date: 09/07/2012  Primary Care Physician:  Dorrene German, MD   Discharge Diagnoses:   Patient Active Problem List  Diagnosis  . TIA (transient ischemic attack)  . Vertigo  . HTN (hypertension), malignant  . Diabetes mellitus type 2, uncontrolled  . Pain in the side  . Pituitary adenoma    DISCHARGE MEDICATION: Medication List  As of 09/07/2012  4:33 PM   STOP taking these medications         pravastatin 20 MG tablet         TAKE these medications         amLODipine 10 MG tablet   Commonly known as: NORVASC   Take 10 mg by mouth daily.      aspirin 81 MG chewable tablet   Chew 81 mg by mouth daily.      atorvastatin 10 MG tablet   Commonly known as: LIPITOR   Take 1 tablet (10 mg total) by mouth daily.      folic acid 1 MG tablet   Commonly known as: FOLVITE   Take 1 mg by mouth daily.      hydrochlorothiazide 12.5 MG capsule   Commonly known as: MICROZIDE   Take 1 capsule (12.5 mg total) by mouth daily.      lisinopril 20 MG tablet   Commonly known as: PRINIVIL,ZESTRIL   Take 20 mg by mouth daily.      meclizine 25 MG tablet   Commonly known as: ANTIVERT   Take 1 tablet (25 mg total) by mouth 3 (three) times daily as needed for dizziness.      metFORMIN 500 MG tablet   Commonly known as: GLUCOPHAGE   Take 500 mg by mouth 2 (two) times daily with a meal.      oxyCODONE-acetaminophen 5-325 MG per tablet   Commonly known as: PERCOCET/ROXICET   Take 1 tablet by mouth daily as needed. For pain      senna-docusate 8.6-50 MG per tablet   Commonly known as: Senokot-S   Take 1 tablet by mouth daily.      vitamin B-1 250 MG tablet   Take 250 mg by mouth daily.              Consults: Treatment Team:  Md Stroke, MD   SIGNIFICANT DIAGNOSTIC STUDIES:  Dg Chest 2 View  09/06/2012  *RADIOLOGY REPORT*  Clinical Data: Shortness of breath.  Pain under the left  arm.  CHEST - 2 VIEW  Comparison: 06/16/2010  Findings: Normal heart size and pulmonary vascularity.  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 Calcified granulomas.  No focal airspace consolidation.  No blunting of costophrenic angles.  No pneumothorax.  Mediastinal contours appear intact.  No significant change since previous study.  IMPRESSION: No evidence of active pulmonary disease.   Original Report Authenticated By: Marlon Pel, M.D.    Ct Head Wo Contrast  09/06/2012  *RADIOLOGY REPORT*  Clinical Data: Code stroke.  Generalized weakness.  CT HEAD WITHOUT CONTRAST  Technique:  Contiguous axial images were obtained from the base of the skull through the vertex without contrast.  Comparison: 05/29/2012.  Findings: The ventricles and sulci are symmetrical without significant effacement, displacement, or dilatation. No mass effect or midline shift. No abnormal extra-axial fluid collections. The grey-white matter junction is distinct. Basal cisterns are not effaced. No acute intracranial hemorrhage. No depressed skull fractures.  Visualized paranasal sinuses  and mastoid air cells are not opacified.  No significant changes since the previous study.  IMPRESSION: No acute intracranial abnormalities.  Code stroke results were telephoned to Dr. Oletta Lamas at the time of dictation, 0316 hours on 09/06/2012.   Original Report Authenticated By: Marlon Pel, M.D.    Mr Brain Wo Contrast  09/07/2012  *RADIOLOGY REPORT*  Clinical Data: Dizziness. Hypertension and diabetes.  Transient ischemic attack.  Known pituitary adenoma.  MRI HEAD WITHOUT CONTRAST  Technique:  Multiplanar, multiecho pulse sequences of the brain and surrounding structures were obtained according to standard protocol without intravenous contrast.  Comparison: CT most recent 09/06/2012.  MR most recent 09/23/2011.  Findings: No acute stroke, intracranial hemorrhage, hydrocephalus, or extra-axial fluid.  Mild atrophy.   Mild chronic microvascular ischemic change.  Although the study was not performed as a dedicated study through the sella turcica, redemonstrated is a  10 x 8 x 8 mm pituitary lesion consistent with an adenoma.  This measures smaller than the most recent MR where cross-sectional dimensions of 18 x 14 x 9 mm were observed.  It is possible that the lesion could measure larger on postcontrast imaging. There is no visible chiasmatic compression.  Presence or absence of cavernous sinus extension difficult to assess on this noncontrast examination.  The major intracranial vascular structures appear widely patent based on flow void.  There is moderate nasopharyngeal adenoidal hypertrophy, unusual in this age group.  No mastoid effusions.  No acute sinus disease.  IMPRESSION: No acute intracranial abnormality.  Noncontrast imaging demonstrates persistent intrasellar lesion consistent with pituitary adenoma.  Correlation with prior study is hampered by lack of a dedicated pituitary imaging and lack of IV contrast, but overall the lesion appears smaller.  Moderate nasopharyngeal adenoidal hypertrophy, nonspecific finding. Correlate clinically.   Original Report Authenticated By: Elsie Stain, M.D.      BRIEF ADMITTING H & P: Patient is a 61 year old gentleman with a history of pituitary adenoma, diabetes, hypertension and previous CVA who awoke this morning with feeling of pain in his left side. Patient states that he also had dizziness upon standing up which prompted him coming to the emergency room. The patient states that in the laying position he has no dizziness but is most worrisome sign is a dizziness that occurs when he stands. He denies any chest pain, headache, blurring of vision, shortness of breath, nausea, vomiting or diarrhea.    Hospital Course:  Present on Admission:  .Vertigo: Pt's presentation was that of vertigo and not weakness. He had a markedly elevated BP which I suspect was the cause of his  dizziness. The BP was lowered and the patient's dizziness resolved. He was evaluated wit an MRI for any evidence of a CVA having an increased risk with previous CVA. The MRI was negative for CVA and Neurology felt that no further work-up was necessary.   Marland KitchenHTN (hypertension), malignant: The pt had markedly elevated BP but no cushing's sign (please note that the patient has a pituitary adenoma). HCTZ was added to his regimen of BP meds and BP decreased. Dizziness subsidede with decrease in BP. Pt had no orthostatic changes.  .Diabetes mellitus type 2: Hb A1c of 6.3 reflects relative control of DM on a chronic basis.  .Pain in the side: Pain is minimal and controlled well with analgesics.  .Hyperlipidemia: Pt was on Simvastatin prior to admission. However is also on Norvasc which has an adverse interaction. Thus he has been changed to Lipitor 10 mg daily.  Condition at the time of discharge: Good  Disposition and Follow-up:  Pt is being D/C'ed home in good condition. Her will follow-up with Dr. Concepcion Elk in 3 days.  Discharge Orders    Future Orders Please Complete By Expires   Diet - low sodium heart healthy      Diet Carb Modified      Activity as tolerated - No restrictions         DISCHARGE EXAM:  General: Alert, awake, oriented x3, in no acute distress. Up ambulating around room without difficulty. Vital Signs:Blood pressure 134/66, pulse 76, temperature 98.2 F (36.8 C), temperature source Oral, resp. rate 18, height 5\' 9"  (1.753 m), weight 75.6 kg (166 lb 10.7 oz), SpO2 100.00%.   Heart: Regular rate and rhythm.  Lungs: Clear to auscultation  Abdomen: Soft, nontender, nondistended, positive bowel sounds, no masses no hepatosplenomegaly noted..  Neuro: No focal neurological deficits noted cranial nerves II through XII grossly intact. DTRs 2+ bilaterally upper and lower extremities. Strength equal and symmetrical in bilateral upper and lower extremities.  Musculoskeletal: No warm  swelling or erythema around joints, no spinal tenderness noted.      Basename 09/07/12 0505 09/06/12 0634  NA 140 140  K 3.6 3.9  CL 101 102  CO2 26 27  GLUCOSE 127* 73  BUN 13 11  CREATININE 0.88 0.98  CALCIUM 10.1 10.1  MG -- --  PHOS -- --    Basename 09/06/12 0634  AST 41*  ALT 41  ALKPHOS 60  BILITOT 0.2*  PROT 7.8  ALBUMIN 3.8   No results found for this basename: LIPASE:2,AMYLASE:2 in the last 72 hours  Basename 09/06/12 0634 09/06/12 0332  WBC 11.2* --  NEUTROABS 5.9 --  HGB 13.9 16.3  HCT 42.5 48.0  MCV 75.0* --  PLT 211 --   Total time for discharge including face to face time and decision making greater than thirty minutes. Signed: Makiya Jeune A. 09/07/2012, 4:33 PM

## 2012-12-29 ENCOUNTER — Emergency Department (HOSPITAL_COMMUNITY): Payer: Medicaid Other

## 2012-12-29 ENCOUNTER — Encounter (HOSPITAL_COMMUNITY): Payer: Self-pay | Admitting: Adult Health

## 2012-12-29 ENCOUNTER — Emergency Department (HOSPITAL_COMMUNITY)
Admission: EM | Admit: 2012-12-29 | Discharge: 2012-12-30 | Disposition: A | Discharge status: Home / Self Care | Payer: Medicaid Other | Attending: Emergency Medicine | Admitting: Emergency Medicine

## 2012-12-29 DIAGNOSIS — R51 Headache: Secondary | ICD-10-CM | POA: Insufficient documentation

## 2012-12-29 DIAGNOSIS — Z79899 Other long term (current) drug therapy: Secondary | ICD-10-CM | POA: Insufficient documentation

## 2012-12-29 DIAGNOSIS — D496 Neoplasm of unspecified behavior of brain: Secondary | ICD-10-CM | POA: Insufficient documentation

## 2012-12-29 DIAGNOSIS — I10 Essential (primary) hypertension: Secondary | ICD-10-CM | POA: Insufficient documentation

## 2012-12-29 DIAGNOSIS — E119 Type 2 diabetes mellitus without complications: Secondary | ICD-10-CM | POA: Insufficient documentation

## 2012-12-29 DIAGNOSIS — Z7982 Long term (current) use of aspirin: Secondary | ICD-10-CM | POA: Insufficient documentation

## 2012-12-29 DIAGNOSIS — R071 Chest pain on breathing: Secondary | ICD-10-CM | POA: Insufficient documentation

## 2012-12-29 DIAGNOSIS — R0789 Other chest pain: Secondary | ICD-10-CM

## 2012-12-29 DIAGNOSIS — Z8673 Personal history of transient ischemic attack (TIA), and cerebral infarction without residual deficits: Secondary | ICD-10-CM | POA: Insufficient documentation

## 2012-12-29 LAB — BASIC METABOLIC PANEL
CO2: 26 mEq/L (ref 19–32)
Calcium: 9.6 mg/dL (ref 8.4–10.5)
Glucose, Bld: 117 mg/dL — ABNORMAL HIGH (ref 70–99)
Sodium: 140 mEq/L (ref 135–145)

## 2012-12-29 LAB — CBC
Hemoglobin: 13 g/dL (ref 13.0–17.0)
MCH: 24.7 pg — ABNORMAL LOW (ref 26.0–34.0)
MCV: 76 fL — ABNORMAL LOW (ref 78.0–100.0)
RBC: 5.26 MIL/uL (ref 4.22–5.81)

## 2012-12-29 LAB — POCT I-STAT TROPONIN I

## 2012-12-29 MED ORDER — MORPHINE SULFATE 4 MG/ML IJ SOLN
4.0000 mg | Freq: Once | INTRAMUSCULAR | Status: AC
  Administered 2012-12-29: 4 mg via INTRAVENOUS
  Filled 2012-12-29: qty 1

## 2012-12-29 MED ORDER — ONDANSETRON HCL 4 MG/2ML IJ SOLN
4.0000 mg | Freq: Once | INTRAMUSCULAR | Status: AC
  Administered 2012-12-29: 4 mg via INTRAVENOUS
  Filled 2012-12-29: qty 2

## 2012-12-29 MED ORDER — ASPIRIN 81 MG PO CHEW
324.0000 mg | CHEWABLE_TABLET | Freq: Once | ORAL | Status: AC
  Administered 2012-12-29: 324 mg via ORAL
  Filled 2012-12-29: qty 4

## 2012-12-29 NOTE — ED Provider Notes (Signed)
History     CSN: [id number]  Arrival date & time 12/29/12  2110   First MD Initiated Contact with Patient 12/29/12 2209      Chief Complaint  Patient presents with  . Headache  . Chest Pain    (Consider location/radiation/quality/duration/timing/severity/associated sxs/prior treatment) HPI  Pt presents to the ER with complaints of headache and chest pain. The patient normally takes percocet at  home for his headache but has run out. He has chronic headaches, he says, because he has a brain  adenoma on his pituitary gland. The chest pain has been all day and intermittent. He says that touching  his chest makes it worse. He also feels short of breath. He has had cough with sputum. nad vss  PMH is positive for hypertension, DM, stroke, brain tumor, narcotic abuse  Past Medical History  Diagnosis Date  . Hypertension   . Diabetes mellitus   . Brain tumor   . CVA (cerebral vascular accident)   . Narcotic abuse     History reviewed. No pertinent past surgical history.  History reviewed. No pertinent family history.  History  Substance Use Topics  . Smoking status: Not on file  . Smokeless tobacco: Not on file  . Alcohol Use: Not on file      Review of Systems  Review of Systems  Gen: no weight loss, fevers, chills, night sweats  Eyes: no discharge or drainage, no occular pain or visual changes  Nose: no epistaxis or rhinorrhea  Mouth: no dental pain, no sore throat  Neck: no neck pain  Lungs:No wheezing, coughing or hemoptysis CV: + chest pain, no  palpitations, dependent edema or orthopnea  Abd: no abdominal pain, nausea, vomiting  GU: no dysuria or gross hematuria  MSK:  No abnormalities  Neuro: + headache, no focal neurologic deficits  Skin: no abnormalities Psyche: negative.   Allergies  Review of patient's allergies indicates no known allergies.  Home Medications   Current Outpatient Rx  Name  Route  Sig  Dispense  Refill  . AMLODIPINE BESYLATE 10  MG PO TABS   Oral   Take 10 mg by mouth daily.         . ASPIRIN 81 MG PO CHEW   Oral   Chew 81 mg by mouth daily.         . ATORVASTATIN CALCIUM 10 MG PO TABS   Oral   Take 1 tablet (10 mg total) by mouth daily.   30 tablet   0   . FOLIC ACID 1 MG PO TABS   Oral   Take 1 mg by mouth daily.         Marland Kitchen HYDROCHLOROTHIAZIDE 12.5 MG PO CAPS   Oral   Take 1 capsule (12.5 mg total) by mouth daily.   30 capsule   0   . LISINOPRIL 20 MG PO TABS   Oral   Take 20 mg by mouth daily.         Marland Kitchen METFORMIN HCL 500 MG PO TABS   Oral   Take 500 mg by mouth 2 (two) times daily with a meal.         . OXYCODONE-ACETAMINOPHEN 5-325 MG PO TABS   Oral   Take 1 tablet by mouth daily as needed. For pain         . SENNOSIDES-DOCUSATE SODIUM 8.6-50 MG PO TABS   Oral   Take 1 tablet by mouth daily.         Marland Kitchen  VITAMIN B-1 250 MG PO TABS   Oral   Take 250 mg by mouth daily.           BP 169/75  Pulse 77  Temp 98.4 F (36.9 C)  Resp 14  SpO2 97%  Physical Exam  Nursing note and vitals reviewed. Constitutional: He appears well-developed and well-nourished. No distress.  HENT:  Head: Normocephalic and atraumatic.  Eyes: Pupils are equal, round, and reactive to light.  Neck: Normal range of motion. Neck supple.  Cardiovascular: Normal rate and regular rhythm.   Pulmonary/Chest: Effort normal.  Abdominal: Soft.  Neurological: He is alert.  Skin: Skin is warm and dry.    ED Course  Procedures (including critical care time)  Labs Reviewed  GLUCOSE, CAPILLARY - Abnormal; Notable for the following:    Glucose-Capillary 127 (*)     All other components within normal limits  CBC - Abnormal; Notable for the following:    MCV 76.0 (*)     MCH 24.7 (*)     All other components within normal limits  BASIC METABOLIC PANEL - Abnormal; Notable for the following:    Glucose, Bld 117 (*)     GFR calc non Af Amer 61 (*)     GFR calc Af Amer 71 (*)     All other components  within normal limits  POCT I-STAT TROPONIN I  TROPONIN I   Dg Chest 2 View (if Patient Has Fever And/or Copd)  12/29/2012  *RADIOLOGY REPORT*  Clinical Data: Chest pain and shortness of breath; cough.  History of smoking.  CHEST - 2 VIEW  Comparison: Chest radiograph performed 09/06/2012  Findings: The lungs are well-aerated.  Minimal apparent right basilar opacity likely reflects atelectasis.  There is no evidence of pleural effusion or pneumothorax.  The heart is borderline normal in size; minimal calcification is noted at the aortic arch.  No acute osseous abnormalities are seen.  IMPRESSION: Minimal apparent right basilar opacity likely reflects atelectasis; lungs otherwise clear.   Original Report Authenticated By: Tonia Ghent, M.D.      No diagnosis found.    MDM   Date: 12/30/2012  Rate: 84  Rhythm: normal sinus rhythm  QRS Axis: normal  Intervals: normal  ST/T Wave abnormalities: normal  Conduction Disutrbances:none  Narrative Interpretation:   Old EKG Reviewed: unchanged    pts chest xray and first troponin are negative.  Pt given Zofran, aspirin and Morphine.   I discussed case with Dr. Dierdre Highman. I have ordered delta troponin and he will follow-up on patient at my end of shift.           Dorthula Matas, PA 12/30/12 0011  Dorthula Matas, PA 12/30/12 0011

## 2012-12-29 NOTE — ED Notes (Signed)
MD at bedside. 

## 2012-12-29 NOTE — ED Notes (Signed)
Pt with Hx of Brain tumor, presents with migraine headache that began 2 days ago, also reports chest pain, SOB and pain with deep inspiration. Pt states "I usually take percocet for pain but I ran out". Pain is assoicated with dizziness and lightheadedness. Chest pain is associated with Cough that is productive, green sputum.

## 2012-12-30 LAB — BASIC METABOLIC PANEL
CO2: 25 mEq/L (ref 19–32)
Chloride: 102 mEq/L (ref 96–112)
Creatinine, Ser: 1.31 mg/dL (ref 0.50–1.35)
Potassium: 3.9 mEq/L (ref 3.5–5.1)

## 2012-12-30 LAB — TROPONIN I: Troponin I: <0.3 ng/mL (ref <0.30)

## 2012-12-30 MED ORDER — HYDROMORPHONE HCL PF 1 MG/ML IJ SOLN
1.0000 mg | Freq: Once | INTRAMUSCULAR | Status: AC
  Administered 2012-12-30: 1 mg via INTRAVENOUS
  Filled 2012-12-30: qty 1

## 2012-12-30 NOTE — ED Notes (Signed)
Pt states pain is worse when he coughs. Pt states he is still having chest pain  On the left side. Pt states pain meds did not help only relieved for about 10 mins

## 2012-12-30 NOTE — ED Provider Notes (Signed)
Medical screening examination/treatment/procedure(s) were performed by non-physician practitioner and as supervising physician I was immediately available for consultation/collaboration.   Lorenia Hoston, MD 12/30/12 1459 

## 2012-12-30 NOTE — ED Notes (Signed)
MD at bedside. 

## 2013-03-17 ENCOUNTER — Emergency Department (HOSPITAL_COMMUNITY): Payer: Self-pay

## 2013-03-17 ENCOUNTER — Emergency Department (HOSPITAL_COMMUNITY)
Admission: EM | Admit: 2013-03-17 | Discharge: 2013-03-17 | Disposition: A | Discharge status: Home / Self Care | Payer: Self-pay | Attending: Emergency Medicine | Admitting: Emergency Medicine

## 2013-03-17 ENCOUNTER — Encounter (HOSPITAL_COMMUNITY): Payer: Self-pay | Admitting: Emergency Medicine

## 2013-03-17 DIAGNOSIS — Z79899 Other long term (current) drug therapy: Secondary | ICD-10-CM | POA: Insufficient documentation

## 2013-03-17 DIAGNOSIS — S61409A Unspecified open wound of unspecified hand, initial encounter: Secondary | ICD-10-CM | POA: Insufficient documentation

## 2013-03-17 DIAGNOSIS — Z7982 Long term (current) use of aspirin: Secondary | ICD-10-CM | POA: Insufficient documentation

## 2013-03-17 DIAGNOSIS — Z8673 Personal history of transient ischemic attack (TIA), and cerebral infarction without residual deficits: Secondary | ICD-10-CM | POA: Insufficient documentation

## 2013-03-17 DIAGNOSIS — S62609A Fracture of unspecified phalanx of unspecified finger, initial encounter for closed fracture: Secondary | ICD-10-CM

## 2013-03-17 DIAGNOSIS — E119 Type 2 diabetes mellitus without complications: Secondary | ICD-10-CM | POA: Insufficient documentation

## 2013-03-17 DIAGNOSIS — F1110 Opioid abuse, uncomplicated: Secondary | ICD-10-CM | POA: Insufficient documentation

## 2013-03-17 DIAGNOSIS — S62639A Displaced fracture of distal phalanx of unspecified finger, initial encounter for closed fracture: Secondary | ICD-10-CM | POA: Insufficient documentation

## 2013-03-17 DIAGNOSIS — T7411XA Adult physical abuse, confirmed, initial encounter: Secondary | ICD-10-CM | POA: Insufficient documentation

## 2013-03-17 DIAGNOSIS — S61451A Open bite of right hand, initial encounter: Secondary | ICD-10-CM

## 2013-03-17 DIAGNOSIS — F17200 Nicotine dependence, unspecified, uncomplicated: Secondary | ICD-10-CM | POA: Insufficient documentation

## 2013-03-17 DIAGNOSIS — I10 Essential (primary) hypertension: Secondary | ICD-10-CM | POA: Insufficient documentation

## 2013-03-17 MED ORDER — AMOXICILLIN-POT CLAVULANATE 875-125 MG PO TABS
1.0000 | ORAL_TABLET | Freq: Once | ORAL | Status: AC
  Administered 2013-03-17: 1 via ORAL
  Filled 2013-03-17: qty 1

## 2013-03-17 MED ORDER — AMOXICILLIN-POT CLAVULANATE 875-125 MG PO TABS: 1.0000 | ORAL_TABLET | Freq: Two times a day (BID) | ORAL | Status: DC

## 2013-03-17 MED ORDER — TETANUS-DIPHTHERIA TOXOIDS TD 5-2 LFU IM INJ
0.5000 mL | INJECTION | Freq: Once | INTRAMUSCULAR | Status: AC
  Administered 2013-03-17: 0.5 mL via INTRAMUSCULAR
  Filled 2013-03-17: qty 0.5

## 2013-03-17 MED ORDER — IBUPROFEN 400 MG PO TABS
600.0000 mg | ORAL_TABLET | Freq: Once | ORAL | Status: AC
  Administered 2013-03-17: 600 mg via ORAL
  Filled 2013-03-17: qty 1

## 2013-03-17 MED ORDER — IBUPROFEN 600 MG PO TABS: 600.0000 mg | ORAL_TABLET | Freq: Three times a day (TID) | ORAL | Status: DC | PRN

## 2013-03-17 NOTE — Progress Notes (Signed)
Orthopedic Tech Progress Note Patient Details:  [name] 08/10/1952 [id number]  Ortho Devices Type of Ortho Device: Finger splint   Haskell Flirt 03/17/2013, 2:41 AM

## 2013-03-17 NOTE — ED Provider Notes (Signed)
Medical screening examination/treatment/procedure(s) were performed by non-physician practitioner and as supervising physician I was immediately available for consultation/collaboration.  John-Adam Brionna Romanek, M.D.  John-Adam Alvon Nygaard, MD 03/17/13 0618 
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
## 2013-05-10 ENCOUNTER — Emergency Department (HOSPITAL_COMMUNITY): Payer: Medicaid Other

## 2013-05-10 ENCOUNTER — Emergency Department (HOSPITAL_COMMUNITY)
Admission: EM | Admit: 2013-05-10 | Discharge: 2013-05-10 | Disposition: A | Discharge status: Home / Self Care | Payer: Medicaid Other | Attending: Emergency Medicine | Admitting: Emergency Medicine

## 2013-05-10 ENCOUNTER — Encounter (HOSPITAL_COMMUNITY): Payer: Self-pay | Admitting: *Deleted

## 2013-05-10 DIAGNOSIS — R0602 Shortness of breath: Secondary | ICD-10-CM | POA: Insufficient documentation

## 2013-05-10 DIAGNOSIS — F1110 Opioid abuse, uncomplicated: Secondary | ICD-10-CM | POA: Insufficient documentation

## 2013-05-10 DIAGNOSIS — Z8673 Personal history of transient ischemic attack (TIA), and cerebral infarction without residual deficits: Secondary | ICD-10-CM | POA: Insufficient documentation

## 2013-05-10 DIAGNOSIS — R071 Chest pain on breathing: Secondary | ICD-10-CM | POA: Insufficient documentation

## 2013-05-10 DIAGNOSIS — R509 Fever, unspecified: Secondary | ICD-10-CM | POA: Insufficient documentation

## 2013-05-10 DIAGNOSIS — R52 Pain, unspecified: Secondary | ICD-10-CM | POA: Insufficient documentation

## 2013-05-10 DIAGNOSIS — Z8669 Personal history of other diseases of the nervous system and sense organs: Secondary | ICD-10-CM | POA: Insufficient documentation

## 2013-05-10 DIAGNOSIS — R42 Dizziness and giddiness: Secondary | ICD-10-CM | POA: Insufficient documentation

## 2013-05-10 DIAGNOSIS — E119 Type 2 diabetes mellitus without complications: Secondary | ICD-10-CM | POA: Insufficient documentation

## 2013-05-10 DIAGNOSIS — R51 Headache: Secondary | ICD-10-CM | POA: Insufficient documentation

## 2013-05-10 DIAGNOSIS — R079 Chest pain, unspecified: Secondary | ICD-10-CM

## 2013-05-10 DIAGNOSIS — R059 Cough, unspecified: Secondary | ICD-10-CM | POA: Insufficient documentation

## 2013-05-10 DIAGNOSIS — R05 Cough: Secondary | ICD-10-CM

## 2013-05-10 DIAGNOSIS — Z79899 Other long term (current) drug therapy: Secondary | ICD-10-CM | POA: Insufficient documentation

## 2013-05-10 DIAGNOSIS — F17200 Nicotine dependence, unspecified, uncomplicated: Secondary | ICD-10-CM | POA: Insufficient documentation

## 2013-05-10 DIAGNOSIS — I10 Essential (primary) hypertension: Secondary | ICD-10-CM | POA: Insufficient documentation

## 2013-05-10 LAB — COMPREHENSIVE METABOLIC PANEL
AST: 46 U/L — ABNORMAL HIGH (ref 0–37)
Albumin: 3.6 g/dL (ref 3.5–5.2)
Alkaline Phosphatase: 61 U/L (ref 39–117)
CO2: 24 mEq/L (ref 19–32)
Chloride: 104 mEq/L (ref 96–112)
GFR calc non Af Amer: 88 mL/min — ABNORMAL LOW (ref >90)
Potassium: 3.5 mEq/L (ref 3.5–5.1)
Total Bilirubin: 0.3 mg/dL (ref 0.3–1.2)

## 2013-05-10 LAB — CBC WITH DIFFERENTIAL/PLATELET
Basophils Absolute: 0 10*3/uL (ref 0.0–0.1)
Eosinophils Absolute: 0.3 10*3/uL (ref 0.0–0.7)
Lymphs Abs: 4.4 10*3/uL — ABNORMAL HIGH (ref 0.7–4.0)
MCH: 25 pg — ABNORMAL LOW (ref 26.0–34.0)
MCHC: 34.4 g/dL (ref 30.0–36.0)
MCV: 72.8 fL — ABNORMAL LOW (ref 78.0–100.0)
Monocytes Absolute: 1 10*3/uL (ref 0.1–1.0)
Neutrophils Relative %: 59 % (ref 43–77)
Platelets: 228 10*3/uL (ref 150–400)
RDW: 14.7 % (ref 11.5–15.5)

## 2013-05-10 LAB — POCT I-STAT TROPONIN I: Troponin i, poc: 0 ng/mL (ref 0.00–0.08)

## 2013-05-10 LAB — GLUCOSE, CAPILLARY

## 2013-05-10 MED ORDER — KETOROLAC TROMETHAMINE 60 MG/2ML IM SOLN
60.0000 mg | Freq: Once | INTRAMUSCULAR | Status: AC
  Administered 2013-05-10: 60 mg via INTRAMUSCULAR
  Filled 2013-05-10: qty 2

## 2013-05-10 MED ORDER — BENZONATATE 200 MG PO CAPS: 200.0000 mg | ORAL_CAPSULE | Freq: Three times a day (TID) | ORAL | Status: DC | PRN

## 2013-05-10 MED ORDER — AZITHROMYCIN 250 MG PO TABS: 250.0000 mg | ORAL_TABLET | Freq: Every day | ORAL | Status: DC

## 2013-05-10 MED ORDER — NAPROXEN 500 MG PO TABS: 500.0000 mg | ORAL_TABLET | Freq: Two times a day (BID) | ORAL | Status: DC

## 2013-05-10 NOTE — ED Notes (Signed)
Here by EMS, from home, lives alone, here for cough, chills, fever, dizziness, body aches, CWP, pain worse with palpation and movement, CWP sudden onset, ASA 324mg  given, no ntg given, NSL 20g, R FA placed by EMS PTA, h/o htn, BP elevated 198/98, NSR on monitor, pt was ambulatory to ambulance, LS CTA.

## 2013-05-10 NOTE — ED Provider Notes (Signed)
History     CSN: [id number]  Arrival date & time 05/10/13  0138   First MD Initiated Contact with Patient 05/10/13 0206      Chief Complaint  Patient presents with  . Dizziness  . Cough  . Fever  . Chills  . Generalized Body Aches  . Chest Pain    (Consider location/radiation/quality/duration/timing/severity/associated sxs/prior treatment) HPI Comments: 62 year old male with a history of diabetes and hypertension with a prior stroke affecting his left upper and lower extremity presents with a complaint of coughing, subjective fevers and chills and chest wall pain that started within the last 2 hours. He stated that it started with a coughing fit, then he developed pain in his chest which is worse with deep breathing, worse with movement and worse with palpation. He is to mild shortness of breath but no swelling in his legs, no dyspnea or chest pain on exertion. He does have associated headache with pain behind his eyes which also gets worse with coughing. He was noted to be hypertensive on paramedic evaluation with a blood pressure of 190/98 but had normal sinus rhythm. He denies any new numbness weakness or loss of vision or difficulty speaking.  Patient is a 62 y.o. male presenting with cough, fever, and chest pain. The history is provided by the patient.  Cough Associated symptoms: chest pain and fever   Fever Associated symptoms: chest pain and cough   Chest Pain Associated symptoms: cough and fever     Past Medical History  Diagnosis Date  . Hypertension   . Diabetes mellitus   . Brain tumor   . CVA (cerebral vascular accident)   . Narcotic abuse     History reviewed. No pertinent past surgical history.  History reviewed. No pertinent family history.  History  Substance Use Topics  . Smoking status: Current Every Day Smoker  . Smokeless tobacco: Not on file  . Alcohol Use: Yes      Review of Systems  Constitutional: Positive for fever.  Respiratory:  Positive for cough.   Cardiovascular: Positive for chest pain.  All other systems reviewed and are negative.    Allergies  Review of patient's allergies indicates no known allergies.  Home Medications   Current Outpatient Rx  Name  Route  Sig  Dispense  Refill  . amLODipine (NORVASC) 10 MG tablet   Oral   Take 10 mg by mouth daily.         Marland Kitchen lisinopril (PRINIVIL,ZESTRIL) 20 MG tablet   Oral   Take 20 mg by mouth daily.         . metFORMIN (GLUCOPHAGE) 500 MG tablet   Oral   Take 500 mg by mouth 2 (two) times daily with a meal.         . pravastatin (PRAVACHOL) 20 MG tablet   Oral   Take 20 mg by mouth daily.         Marland Kitchen azithromycin (ZITHROMAX Z-PAK) 250 MG tablet   Oral   Take 1 tablet (250 mg total) by mouth daily. 500mg  PO day 1, then 250mg  PO days 205   6 tablet   0   . benzonatate (TESSALON) 200 MG capsule   Oral   Take 1 capsule (200 mg total) by mouth 3 (three) times daily as needed for cough.   20 capsule   0   . naproxen (NAPROSYN) 500 MG tablet   Oral   Take 1 tablet (500 mg total) by mouth 2 (two) times  daily with a meal.   30 tablet   0     BP 180/81  Pulse 80  Temp(Src) 98.7 F (37.1 C) (Oral)  Resp 9  SpO2 99%  Physical Exam  Nursing note and vitals reviewed. Constitutional: He appears well-developed and well-nourished. No distress.  HENT:  Head: Normocephalic and atraumatic.  Mouth/Throat: Oropharynx is clear and moist. No oropharyngeal exudate.  Eyes: Conjunctivae and EOM are normal. Pupils are equal, round, and reactive to light. Right eye exhibits no discharge. Left eye exhibits no discharge. No scleral icterus.  Neck: Normal range of motion. Neck supple. No JVD present. No thyromegaly present.  Cardiovascular: Normal rate, regular rhythm, normal heart sounds and intact distal pulses.  Exam reveals no gallop and no friction rub.   No murmur heard. Pulmonary/Chest: Effort normal and breath sounds normal. No respiratory distress.  He has no wheezes. He has no rales. He exhibits tenderness ( To palpation over the chest wall).  Abdominal: Soft. Bowel sounds are normal. He exhibits no distension and no mass. There is no tenderness.  No significant abdominal tenderness  Musculoskeletal: Normal range of motion. He exhibits no edema and no tenderness.  Lymphadenopathy:    He has no cervical adenopathy.  Neurological: He is alert. Coordination normal.  Skin: Skin is warm and dry. No rash noted. No erythema.  Psychiatric: He has a normal mood and affect. His behavior is normal.    ED Course  Procedures (including critical care time)  Labs Reviewed  COMPREHENSIVE METABOLIC PANEL - Abnormal; Notable for the following:    AST 46 (*)    GFR calc non Af Amer 88 (*)    All other components within normal limits  CBC WITH DIFFERENTIAL - Abnormal; Notable for the following:    WBC 13.8 (*)    HCT 38.1 (*)    MCV 72.8 (*)    MCH 25.0 (*)    Neutro Abs 8.1 (*)    Lymphs Abs 4.4 (*)    All other components within normal limits  GLUCOSE, CAPILLARY  POCT I-STAT TROPONIN I   Dg Chest 2 View  05/10/2013  *RADIOLOGY REPORT*  Clinical Data: Cough, fever  CHEST - 2 VIEW  Comparison: 12/29/2012  Findings: Lungs are clear. No pleural effusion or pneumothorax. The cardiomediastinal contours are within normal limits. The visualized bones and soft tissues are without significant appreciable abnormality.  IMPRESSION: No radiographic evidence of acute cardiopulmonary process.   Original Report Authenticated By: Jearld Lesch, M.D.      1. Cough   2. Chest pain   3. Headache       MDM  The patient's EKG is unremarkable, normal sinus rhythm, normal axis, no ST elevations or depressions. It sounds as though his chest wall pain is related to the coughing, as he is a smoker he will need a chest x-ray to evaluate for pneumothorax or pneumonia. Blood pressure has improved currently 159/90, no fever, no tachycardia, normal oxygenation of  99%.  ED ECG REPORT  I personally interpreted this EKG   Date: 05/10/2013   Rate: 77  Rhythm: normal sinus rhythm  QRS Axis: normal  Intervals: normal  ST/T Wave abnormalities: normal  Conduction Disutrbances:none  Narrative Interpretation:   Old EKG Reviewed: Compared with 12/29/2012, no significant changes  EKG without any signs of ischemia, chest x-ray without any focal infiltrates, blood pressure has improved, pain medication given, patient appears stable for discharge. The patient's symptoms started with a cough, chest pain came on  after coughing fits and is likely related to the underlying cough however there is no pneumothorax, unlikely to be pulmonary embolism or ischemic in nature. The cough does seem to be producing a fairly thick sputum.  Patient given instructions to return should symptoms worsen and has expressed his understanding.  Meds given in ED:  Medications  ketorolac (TORADOL) injection 60 mg (60 mg Intramuscular Given 05/10/13 0454)    New Prescriptions   AZITHROMYCIN (ZITHROMAX Z-PAK) 250 MG TABLET    Take 1 tablet (250 mg total) by mouth daily. 500mg  PO day 1, then 250mg  PO days 205   BENZONATATE (TESSALON) 200 MG CAPSULE    Take 1 capsule (200 mg total) by mouth 3 (three) times daily as needed for cough.   NAPROXEN (NAPROSYN) 500 MG TABLET    Take 1 tablet (500 mg total) by mouth 2 (two) times daily with a meal.           Vida Roller, MD 05/10/13 [phone]

## 2013-05-19 ENCOUNTER — Encounter (HOSPITAL_COMMUNITY): Payer: Self-pay | Admitting: *Deleted

## 2013-05-19 ENCOUNTER — Emergency Department (HOSPITAL_COMMUNITY): Payer: Medicaid Other

## 2013-05-19 DIAGNOSIS — F17200 Nicotine dependence, unspecified, uncomplicated: Secondary | ICD-10-CM | POA: Insufficient documentation

## 2013-05-19 DIAGNOSIS — E119 Type 2 diabetes mellitus without complications: Secondary | ICD-10-CM | POA: Insufficient documentation

## 2013-05-19 DIAGNOSIS — Z86011 Personal history of benign neoplasm of the brain: Secondary | ICD-10-CM | POA: Insufficient documentation

## 2013-05-19 DIAGNOSIS — Z79899 Other long term (current) drug therapy: Secondary | ICD-10-CM | POA: Insufficient documentation

## 2013-05-19 DIAGNOSIS — I10 Essential (primary) hypertension: Secondary | ICD-10-CM | POA: Insufficient documentation

## 2013-05-19 DIAGNOSIS — Z8673 Personal history of transient ischemic attack (TIA), and cerebral infarction without residual deficits: Secondary | ICD-10-CM | POA: Insufficient documentation

## 2013-05-19 DIAGNOSIS — R42 Dizziness and giddiness: Secondary | ICD-10-CM | POA: Insufficient documentation

## 2013-05-19 NOTE — ED Notes (Signed)
Feeling dizzy again; here 05/12/13  for dizziness and was told he had bronchitis. Today, "feels like bp his high b/c of feeling dizzy, mouth hurting." taking bp meds.

## 2013-05-20 ENCOUNTER — Emergency Department (HOSPITAL_COMMUNITY): Payer: Medicaid Other

## 2013-05-20 ENCOUNTER — Emergency Department (HOSPITAL_COMMUNITY)
Admission: EM | Admit: 2013-05-20 | Discharge: 2013-05-20 | Disposition: A | Discharge status: Home / Self Care | Payer: Medicaid Other | Attending: Emergency Medicine | Admitting: Emergency Medicine

## 2013-05-20 DIAGNOSIS — I10 Essential (primary) hypertension: Secondary | ICD-10-CM

## 2013-05-20 LAB — CBC
HCT: 41.9 % (ref 39.0–52.0)
Hemoglobin: 13.4 g/dL (ref 13.0–17.0)
MCH: 24.3 pg — ABNORMAL LOW (ref 26.0–34.0)
MCHC: 32 g/dL (ref 30.0–36.0)
MCV: 75.9 fL — ABNORMAL LOW (ref 78.0–100.0)

## 2013-05-20 LAB — BASIC METABOLIC PANEL
BUN: 13 mg/dL (ref 6–23)
Creatinine, Ser: 1.06 mg/dL (ref 0.50–1.35)
GFR calc non Af Amer: 75 mL/min — ABNORMAL LOW (ref >90)
Glucose, Bld: 120 mg/dL — ABNORMAL HIGH (ref 70–99)
Potassium: 4 mEq/L (ref 3.5–5.1)

## 2013-05-20 MED ORDER — CLONIDINE HCL 0.1 MG PO TABS
0.1000 mg | ORAL_TABLET | Freq: Once | ORAL | Status: AC
  Administered 2013-05-20: 0.1 mg via ORAL
  Filled 2013-05-20: qty 1

## 2013-05-20 NOTE — ED Notes (Signed)
Patient transported to CT 

## 2013-05-20 NOTE — ED Provider Notes (Signed)
History     CSN: [id number]  Arrival date & time 05/19/13  2151   First MD Initiated Contact with Patient 05/20/13 0005      Chief Complaint  Patient presents with  . Hypertension  . Dizziness    (Consider location/radiation/quality/duration/timing/severity/associated sxs/prior treatment) HPI Comments: Patient comes to the ER for evaluation of dizziness. Patient has been seen for this in the past. Patient reports that he became concerned tonight because he lives alone. He has a mild no vertiginous dizziness that has been present in the past. He has not had any headache. He is concerned that it is his blood pressure causing the dizziness. He reports that he has been taking all of his prescribed medications. He does not have any chest pain or shortness of breath associated with the symptoms. There is no numbness, tingling or weakness in the extremities.  Patient is a 62 y.o. male presenting with hypertension.  Hypertension Pertinent negatives include no chest pain, no headaches and no shortness of breath.    Past Medical History  Diagnosis Date  . Hypertension   . Diabetes mellitus   . Brain tumor   . CVA (cerebral vascular accident)   . Narcotic abuse     History reviewed. No pertinent past surgical history.  No family history on file.  History  Substance Use Topics  . Smoking status: Current Every Day Smoker  . Smokeless tobacco: Not on file  . Alcohol Use: Yes      Review of Systems  Respiratory: Negative for shortness of breath.   Cardiovascular: Negative for chest pain.  Neurological: Positive for dizziness. Negative for headaches.  All other systems reviewed and are negative.    Allergies  Review of patient's allergies indicates no known allergies.  Home Medications   Current Outpatient Rx  Name  Route  Sig  Dispense  Refill  . amLODipine (NORVASC) 10 MG tablet   Oral   Take 10 mg by mouth daily.         Marland Kitchen azithromycin (ZITHROMAX Z-PAK) 250 MG  tablet   Oral   Take 1 tablet (250 mg total) by mouth daily. 500mg  PO day 1, then 250mg  PO days 205   6 tablet   0   . benzonatate (TESSALON) 200 MG capsule   Oral   Take 1 capsule (200 mg total) by mouth 3 (three) times daily as needed for cough.   20 capsule   0   . lisinopril (PRINIVIL,ZESTRIL) 20 MG tablet   Oral   Take 20 mg by mouth daily.         . metFORMIN (GLUCOPHAGE) 500 MG tablet   Oral   Take 500 mg by mouth 2 (two) times daily with a meal.         . naproxen (NAPROSYN) 500 MG tablet   Oral   Take 1 tablet (500 mg total) by mouth 2 (two) times daily with a meal.   30 tablet   0   . pravastatin (PRAVACHOL) 20 MG tablet   Oral   Take 20 mg by mouth daily.           BP 166/82  Pulse 71  Temp(Src) 97.6 F (36.4 C) (Oral)  Resp 16  SpO2 95%  Physical Exam  Constitutional: He is oriented to person, place, and time. He appears well-developed and well-nourished. No distress.  HENT:  Head: Normocephalic and atraumatic.  Right Ear: Hearing normal.  Left Ear: Hearing normal.  Nose: Nose normal.  Mouth/Throat: Oropharynx is clear and moist and mucous membranes are normal.  Eyes: Conjunctivae and EOM are normal. Pupils are equal, round, and reactive to light.  Neck: Normal range of motion. Neck supple.  Cardiovascular: Regular rhythm, S1 normal and S2 normal.  Exam reveals no gallop and no friction rub.   No murmur heard. Pulmonary/Chest: Effort normal and breath sounds normal. No respiratory distress. He exhibits no tenderness.  Abdominal: Soft. Normal appearance and bowel sounds are normal. There is no hepatosplenomegaly. There is no tenderness. There is no rebound, no guarding, no tenderness at McBurney's point and negative Murphy's sign. No hernia.  Musculoskeletal: Normal range of motion.  Neurological: He is alert and oriented to person, place, and time. He has normal strength. No cranial nerve deficit or sensory deficit. Coordination normal. GCS eye  subscore is 4. GCS verbal subscore is 5. GCS motor subscore is 6.  Negative pronator drift Normal finger to nose  Skin: Skin is warm, dry and intact. No rash noted. No cyanosis.  Psychiatric: He has a normal mood and affect. His speech is normal and behavior is normal. Thought content normal.    ED Course  Procedures (including critical care time)  Labs Reviewed  CBC  BASIC METABOLIC PANEL   Dg Chest 2 View  05/19/2013   *RADIOLOGY REPORT*  Clinical Data: Dizziness and chest pain.  CHEST - 2 VIEW  Comparison: 05/10/2013.  Findings: The cardiac silhouette, mediastinal and hilar contours are normal and stable.  The lungs are clear.  No pleural effusion. The bony thorax is intact.  IMPRESSION: No acute cardiopulmonary findings.   Original Report Authenticated By: Rudie Meyer, M.D.   Ct Head Wo Contrast  05/20/2013   *RADIOLOGY REPORT*  Clinical Data: Hypertension.  Dizziness.  CT HEAD WITHOUT CONTRAST  Technique:  Contiguous axial images were obtained from the base of the skull through the vertex without contrast.  Comparison: Multiple previous head CTs.  Findings: The ventricles are normal.  No extra-axial fluid collections are seen.  The brainstem and cerebellum are unremarkable.  No acute intracranial findings such as infarction or hemorrhage.  No mass lesions.  The bony calvarium is intact.  The visualized paranasal sinuses and mastoid air cells are clear.  IMPRESSION: No acute intracranial findings or mass lesion.   Original Report Authenticated By: Rudie Meyer, M.D.     Diagnosis: 1. Dizziness 2. Hypertension    MDM  Patient presents to the ER for evaluation of dizziness. He has had this problem before. Patient concerned because he has a previous history of CVA and brain tumor. Head CT was unremarkable. Patient's neurologic examination was also normal. Patient did have elevated blood pressure 172/73. This improved with clonidine. Remainder the patient's workup was unremarkable.  Patient will be discharged, followup with his primary care doctor for repeat blood pressure check        Gilda Crease, MD 05/20/13 [phone]

## 2013-06-07 ENCOUNTER — Emergency Department (HOSPITAL_COMMUNITY)
Admission: EM | Admit: 2013-06-07 | Discharge: 2013-06-07 | Disposition: A | Discharge status: Home / Self Care | Payer: Medicaid Other | Attending: Emergency Medicine | Admitting: Emergency Medicine

## 2013-06-07 ENCOUNTER — Encounter (HOSPITAL_COMMUNITY): Payer: Self-pay | Admitting: Emergency Medicine

## 2013-06-07 DIAGNOSIS — R221 Localized swelling, mass and lump, neck: Secondary | ICD-10-CM | POA: Insufficient documentation

## 2013-06-07 DIAGNOSIS — I10 Essential (primary) hypertension: Secondary | ICD-10-CM | POA: Insufficient documentation

## 2013-06-07 DIAGNOSIS — E1129 Type 2 diabetes mellitus with other diabetic kidney complication: Secondary | ICD-10-CM | POA: Insufficient documentation

## 2013-06-07 DIAGNOSIS — R109 Unspecified abdominal pain: Secondary | ICD-10-CM | POA: Insufficient documentation

## 2013-06-07 DIAGNOSIS — F1310 Sedative, hypnotic or anxiolytic abuse, uncomplicated: Secondary | ICD-10-CM | POA: Insufficient documentation

## 2013-06-07 DIAGNOSIS — F17200 Nicotine dependence, unspecified, uncomplicated: Secondary | ICD-10-CM | POA: Insufficient documentation

## 2013-06-07 DIAGNOSIS — R51 Headache: Secondary | ICD-10-CM | POA: Insufficient documentation

## 2013-06-07 DIAGNOSIS — N390 Urinary tract infection, site not specified: Secondary | ICD-10-CM | POA: Insufficient documentation

## 2013-06-07 DIAGNOSIS — Z79899 Other long term (current) drug therapy: Secondary | ICD-10-CM | POA: Insufficient documentation

## 2013-06-07 DIAGNOSIS — R112 Nausea with vomiting, unspecified: Secondary | ICD-10-CM | POA: Insufficient documentation

## 2013-06-07 DIAGNOSIS — Z8673 Personal history of transient ischemic attack (TIA), and cerebral infarction without residual deficits: Secondary | ICD-10-CM | POA: Insufficient documentation

## 2013-06-07 DIAGNOSIS — R220 Localized swelling, mass and lump, head: Secondary | ICD-10-CM | POA: Insufficient documentation

## 2013-06-07 DIAGNOSIS — H53149 Visual discomfort, unspecified: Secondary | ICD-10-CM | POA: Insufficient documentation

## 2013-06-07 LAB — POCT I-STAT, CHEM 8
BUN: 13 mg/dL (ref 6–23)
Calcium, Ion: 1.18 mmol/L (ref 1.12–1.23)
Chloride: 106 mEq/L (ref 96–112)
Glucose, Bld: 151 mg/dL — ABNORMAL HIGH (ref 70–99)
Potassium: 4.1 mEq/L (ref 3.5–5.1)

## 2013-06-07 LAB — URINE MICROSCOPIC-ADD ON

## 2013-06-07 LAB — CBC WITH DIFFERENTIAL/PLATELET
Basophils Absolute: 0 10*3/uL (ref 0.0–0.1)
Eosinophils Relative: 2 % (ref 0–5)
HCT: 42.5 % (ref 39.0–52.0)
Hemoglobin: 14.2 g/dL (ref 13.0–17.0)
Lymphocytes Relative: 25 % (ref 12–46)
Lymphs Abs: 3.4 10*3/uL (ref 0.7–4.0)
MCV: 74.6 fL — ABNORMAL LOW (ref 78.0–100.0)
Monocytes Absolute: 0.9 10*3/uL (ref 0.1–1.0)
Monocytes Relative: 7 % (ref 3–12)
RDW: 15.3 % (ref 11.5–15.5)
WBC: 13.8 10*3/uL — ABNORMAL HIGH (ref 4.0–10.5)

## 2013-06-07 LAB — URINALYSIS, ROUTINE W REFLEX MICROSCOPIC
Bilirubin Urine: NEGATIVE
Glucose, UA: NEGATIVE mg/dL
Hgb urine dipstick: NEGATIVE
Ketones, ur: 15 mg/dL — AB
pH: 8 (ref 5.0–8.0)

## 2013-06-07 MED ORDER — CEPHALEXIN 500 MG PO CAPS: 500.0000 mg | ORAL_CAPSULE | Freq: Four times a day (QID) | ORAL | Status: DC

## 2013-06-07 MED ORDER — OXYCODONE-ACETAMINOPHEN 5-325 MG PO TABS
2.0000 | ORAL_TABLET | Freq: Once | ORAL | Status: AC
  Administered 2013-06-07: 2 via ORAL
  Filled 2013-06-07: qty 2

## 2013-06-07 MED ORDER — CEPHALEXIN 250 MG PO CAPS
1000.0000 mg | ORAL_CAPSULE | Freq: Once | ORAL | Status: AC
  Administered 2013-06-07: 1000 mg via ORAL
  Filled 2013-06-07: qty 4

## 2013-06-07 NOTE — ED Provider Notes (Signed)
History     CSN: [id number]  Arrival date & time 06/07/13  2010   First MD Initiated Contact with Patient 06/07/13 2233      Chief Complaint  Patient presents with  . Headache    (Consider location/radiation/quality/duration/timing/severity/associated sxs/prior treatment) HPI Comments: 62 y/o male with a PMHx of HTN, DM, brain tumor, CVA and narcotic abuse presents to the ED complaining of foul smelling urine x 2 days with associated increased urinary frequency, urgency and dysuria. Tried increasing fluids without any changes, and later in the day yesterday developed mild lower suprapubic pain with an episode of vomiting. He later gradually developed a frontal headache described as throbbing rated 8/10, unrelieved by [facility] powder. Admits to associated photophobia and lightheadedness. Denies vision changes, confusion, speech changes, weakness, numbness or fever. No testicular pain or swelling.  Patient is a 62 y.o. male presenting with headaches. The history is provided by the patient.  Headache Associated symptoms: nausea, photophobia and vomiting   Associated symptoms: no back pain, no fever, no neck pain, no neck stiffness and no numbness     Past Medical History  Diagnosis Date  . Hypertension   . Diabetes mellitus   . Brain tumor   . CVA (cerebral vascular accident)   . Narcotic abuse     History reviewed. No pertinent past surgical history.  History reviewed. No pertinent family history.  History  Substance Use Topics  . Smoking status: Current Every Day Smoker  . Smokeless tobacco: Not on file  . Alcohol Use: Yes     Comment: 6 pack a week      Review of Systems  Constitutional: Negative for fever and activity change.  HENT: Negative for neck pain and neck stiffness.   Eyes: Positive for photophobia. Negative for visual disturbance.  Respiratory: Negative for shortness of breath.   Cardiovascular: Negative for chest pain.  Gastrointestinal: Positive for nausea  and vomiting.  Genitourinary: Positive for dysuria, urgency, frequency and difficulty urinating. Negative for hematuria, discharge and penile pain.  Musculoskeletal: Negative for back pain.  Neurological: Positive for headaches. Negative for syncope, weakness and numbness.  Psychiatric/Behavioral: Negative for confusion.  All other systems reviewed and are negative.    Allergies  Review of patient's allergies indicates no known allergies.  Home Medications   Current Outpatient Rx  Name  Route  Sig  Dispense  Refill  . amLODipine (NORVASC) 10 MG tablet   Oral   Take 10 mg by mouth daily.         . benzonatate (TESSALON) 200 MG capsule   Oral   Take 1 capsule (200 mg total) by mouth 3 (three) times daily as needed for cough.   20 capsule   0   . lisinopril (PRINIVIL,ZESTRIL) 20 MG tablet   Oral   Take 20 mg by mouth daily.         . metFORMIN (GLUCOPHAGE) 500 MG tablet   Oral   Take 500 mg by mouth 2 (two) times daily with a meal.         . naproxen (NAPROSYN) 500 MG tablet   Oral   Take 1 tablet (500 mg total) by mouth 2 (two) times daily with a meal.   30 tablet   0   . pravastatin (PRAVACHOL) 20 MG tablet   Oral   Take 20 mg by mouth daily.           BP 170/77  Pulse 85  Temp(Src) 98.7 F (37.1 C) (Oral)  Resp 18  SpO2 96%  Physical Exam  Nursing note and vitals reviewed. Constitutional: He is oriented to person, place, and time. He appears well-developed and well-nourished. No distress.  HENT:  Head: Normocephalic and atraumatic.  Nose: Nose normal.  Mouth/Throat: Uvula is midline, oropharynx is clear and moist and mucous membranes are normal.  Eyes: Conjunctivae and EOM are normal. Pupils are equal, round, and reactive to light.  Neck: Normal range of motion. Neck supple.  Cardiovascular: Normal rate, regular rhythm, normal heart sounds and intact distal pulses.   Pulmonary/Chest: Effort normal and breath sounds normal. No respiratory  distress.  Abdominal: Soft. Normal appearance and bowel sounds are normal. He exhibits no distension and no mass. There is tenderness (mild) in the suprapubic area. There is no rigidity, no rebound and no guarding.  Musculoskeletal: Normal range of motion. He exhibits no edema.  Neurological: He is alert and oriented to person, place, and time. He has normal strength. No cranial nerve deficit or sensory deficit. He displays a negative Romberg sign. Coordination and gait normal. GCS eye subscore is 4. GCS verbal subscore is 5. GCS motor subscore is 6.  Skin: Skin is warm and dry. He is not diaphoretic.  Psychiatric: He has a normal mood and affect. His behavior is normal.    ED Course  Procedures (including critical care time)  Labs Reviewed  CBC WITH DIFFERENTIAL - Abnormal; Notable for the following:    WBC 13.8 (*)    MCV 74.6 (*)    MCH 24.9 (*)    Neutro Abs 9.2 (*)    All other components within normal limits  URINALYSIS, ROUTINE W REFLEX MICROSCOPIC - Abnormal; Notable for the following:    Color, Urine AMBER (*)    APPearance TURBID (*)    Ketones, ur 15 (*)    Protein, ur 30 (*)    Leukocytes, UA LARGE (*)    All other components within normal limits  URINE MICROSCOPIC-ADD ON - Abnormal; Notable for the following:    Bacteria, UA FEW (*)    Crystals TRIPLE PHOSPHATE CRYSTALS (*)    All other components within normal limits  POCT I-STAT, CHEM 8 - Abnormal; Notable for the following:    Glucose, Bld 151 (*)    All other components within normal limits  URINE CULTURE   No results found.   1. Urinary tract infection   2. Headache       MDM  62 y/o male with UTI and headache. No red flags concerning patient's headache. No focal neuro deficits, afebrile, NAD. Headache relieved by percocet. On re-evaluation he is sleeping comfortably on exam bed. Rx keflex for UTI. Urine culture in process. He will f/u with Dr. Concepcion Elk. Advised increase fluids and rest. Return precautions  discussed. Patient states understanding of plan and is agreeable.        Trevor Mace, PA-C 06/07/13 2342

## 2013-06-07 NOTE — ED Notes (Signed)
Patient with headache since yesterday.  Patient states that he had vomiting yesterday with the headache.  He continues today with headache and now having foul smelling urine.

## 2013-06-08 NOTE — ED Provider Notes (Signed)
Medical screening examination/treatment/procedure(s) were performed by non-physician practitioner and as supervising physician I was immediately available for consultation/collaboration.   Gwyneth Sprout, MD 06/08/13 0002

## 2013-06-09 LAB — URINE CULTURE

## 2013-07-01 ENCOUNTER — Emergency Department (HOSPITAL_COMMUNITY)
Admission: EM | Admit: 2013-07-01 | Discharge: 2013-07-02 | Disposition: A | Discharge status: Home / Self Care | Payer: Medicaid Other | Attending: Emergency Medicine | Admitting: Emergency Medicine

## 2013-07-01 ENCOUNTER — Encounter (HOSPITAL_COMMUNITY): Payer: Self-pay | Admitting: *Deleted

## 2013-07-01 ENCOUNTER — Emergency Department (HOSPITAL_COMMUNITY): Payer: Medicaid Other

## 2013-07-01 DIAGNOSIS — I10 Essential (primary) hypertension: Secondary | ICD-10-CM | POA: Insufficient documentation

## 2013-07-01 DIAGNOSIS — R109 Unspecified abdominal pain: Secondary | ICD-10-CM

## 2013-07-01 DIAGNOSIS — Z8673 Personal history of transient ischemic attack (TIA), and cerebral infarction without residual deficits: Secondary | ICD-10-CM | POA: Insufficient documentation

## 2013-07-01 DIAGNOSIS — E119 Type 2 diabetes mellitus without complications: Secondary | ICD-10-CM | POA: Insufficient documentation

## 2013-07-01 DIAGNOSIS — R112 Nausea with vomiting, unspecified: Secondary | ICD-10-CM | POA: Insufficient documentation

## 2013-07-01 DIAGNOSIS — N390 Urinary tract infection, site not specified: Secondary | ICD-10-CM | POA: Insufficient documentation

## 2013-07-01 DIAGNOSIS — Z86011 Personal history of benign neoplasm of the brain: Secondary | ICD-10-CM | POA: Insufficient documentation

## 2013-07-01 DIAGNOSIS — F17200 Nicotine dependence, unspecified, uncomplicated: Secondary | ICD-10-CM | POA: Insufficient documentation

## 2013-07-01 DIAGNOSIS — R1084 Generalized abdominal pain: Secondary | ICD-10-CM | POA: Insufficient documentation

## 2013-07-01 DIAGNOSIS — R51 Headache: Secondary | ICD-10-CM | POA: Insufficient documentation

## 2013-07-01 DIAGNOSIS — H53149 Visual discomfort, unspecified: Secondary | ICD-10-CM | POA: Insufficient documentation

## 2013-07-01 DIAGNOSIS — R42 Dizziness and giddiness: Secondary | ICD-10-CM | POA: Insufficient documentation

## 2013-07-01 DIAGNOSIS — Z79899 Other long term (current) drug therapy: Secondary | ICD-10-CM | POA: Insufficient documentation

## 2013-07-01 LAB — CBC
HCT: 42.2 % (ref 39.0–52.0)
Hemoglobin: 14.2 g/dL (ref 13.0–17.0)
MCH: 24.5 pg — ABNORMAL LOW (ref 26.0–34.0)
MCHC: 33.6 g/dL (ref 30.0–36.0)
RBC: 5.79 MIL/uL (ref 4.22–5.81)

## 2013-07-01 LAB — COMPREHENSIVE METABOLIC PANEL
ALT: 38 U/L (ref 0–53)
Alkaline Phosphatase: 60 U/L (ref 39–117)
BUN: 30 mg/dL — ABNORMAL HIGH (ref 6–23)
CO2: 21 mEq/L (ref 19–32)
GFR calc Af Amer: 81 mL/min — ABNORMAL LOW (ref >90)
GFR calc non Af Amer: 70 mL/min — ABNORMAL LOW (ref >90)
Glucose, Bld: 102 mg/dL — ABNORMAL HIGH (ref 70–99)
Potassium: 4.6 mEq/L (ref 3.5–5.1)
Total Protein: 8.2 g/dL (ref 6.0–8.3)

## 2013-07-01 LAB — LIPASE, BLOOD: Lipase: 50 U/L (ref 11–59)

## 2013-07-01 MED ORDER — DIPHENHYDRAMINE HCL 50 MG/ML IJ SOLN
25.0000 mg | Freq: Once | INTRAMUSCULAR | Status: AC
  Administered 2013-07-01: 25 mg via INTRAVENOUS
  Filled 2013-07-01: qty 1

## 2013-07-01 MED ORDER — METOCLOPRAMIDE HCL 5 MG/ML IJ SOLN
10.0000 mg | Freq: Once | INTRAMUSCULAR | Status: AC
  Administered 2013-07-01: 10 mg via INTRAVENOUS
  Filled 2013-07-01: qty 2

## 2013-07-01 MED ORDER — SODIUM CHLORIDE 0.9 % IV BOLUS (SEPSIS)
1000.0000 mL | Freq: Once | INTRAVENOUS | Status: AC
  Administered 2013-07-01: 1000 mL via INTRAVENOUS

## 2013-07-01 MED ORDER — KETOROLAC TROMETHAMINE 30 MG/ML IJ SOLN
30.0000 mg | Freq: Once | INTRAMUSCULAR | Status: AC
  Administered 2013-07-01: 30 mg via INTRAVENOUS
  Filled 2013-07-01: qty 1

## 2013-07-01 MED ORDER — DEXAMETHASONE SODIUM PHOSPHATE 10 MG/ML IJ SOLN
10.0000 mg | Freq: Once | INTRAMUSCULAR | Status: AC
  Administered 2013-07-01: 10 mg via INTRAVENOUS
  Filled 2013-07-01: qty 1

## 2013-07-01 NOTE — ED Notes (Signed)
Pt in xray

## 2013-07-01 NOTE — ED Provider Notes (Signed)
Medical screening examination/treatment/procedure(s) were conducted as a shared visit with non-physician practitioner(s) and myself.  I personally evaluated the patient during the encounter  Patient with worsening chronic headaches which is secondary to a known brain tumor. Also notes suprapubic and midepigastric abdominal pain. No signs of surgical abdomen at this time. Evaluation is pending  Toy Baker, MD 07/01/13 [phone]

## 2013-07-01 NOTE — ED Notes (Signed)
The pt is c/o the top of his head hurting and and pain with nv since yesterday

## 2013-07-01 NOTE — ED Provider Notes (Signed)
History    CSN: [id number] Arrival date & time 07/01/13  2057  First MD Initiated Contact with Patient 07/01/13 2113     Chief Complaint  Patient presents with  . head and abd pain    (Consider location/radiation/quality/duration/timing/severity/associated sxs/prior Treatment) HPI Comments: 62 year old male with a past medical history of hypertension, diabetes, brain tumor, CVA and narcotic abuse presents to the emergency department complaining of gradual onset generalized headache beginning while he was standing outside bleeding at the bus stop yesterday. Headache located at the top of his head, radiating throughout, described as throbbing rated 10 out of 10. He has not tried any alleviating factors. Admits to photophobia and dizziness. No visual disturbance. Also complaining of generalized abdominal pain beginning earlier today, described as crampy rated 10 out of 10. He has not tried any alleviating factors for his abdominal pain. Admits to associated nausea and vomiting. He had a bowl of eggs this morning, began vomiting in the afternoon. No recent travel or sick contacts. Denies recent drug or alcohol use.  The history is provided by the patient.   Past Medical History  Diagnosis Date  . Hypertension   . Diabetes mellitus   . Brain tumor   . CVA (cerebral vascular accident)   . Narcotic abuse    History reviewed. No pertinent past surgical history. No family history on file. History  Substance Use Topics  . Smoking status: Current Every Day Smoker  . Smokeless tobacco: Not on file  . Alcohol Use: Yes     Comment: 6 pack a week    Review of Systems  Constitutional: Negative for fever and chills.  Eyes: Positive for photophobia. Negative for visual disturbance.  Respiratory: Negative for shortness of breath.   Cardiovascular: Negative for chest pain.  Gastrointestinal: Positive for nausea, vomiting and abdominal pain. Negative for diarrhea and constipation.  Neurological:  Positive for dizziness and headaches. Negative for syncope.  Psychiatric/Behavioral: Negative for confusion.  All other systems reviewed and are negative.    Allergies  Review of patient's allergies indicates no known allergies.  Home Medications   Current Outpatient Rx  Name  Route  Sig  Dispense  Refill  . amLODipine (NORVASC) 10 MG tablet   Oral   Take 10 mg by mouth daily.         Marland Kitchen lisinopril (PRINIVIL,ZESTRIL) 20 MG tablet   Oral   Take 20 mg by mouth daily.         . metFORMIN (GLUCOPHAGE) 500 MG tablet   Oral   Take 500 mg by mouth 2 (two) times daily with a meal.         . pravastatin (PRAVACHOL) 20 MG tablet   Oral   Take 20 mg by mouth daily.          BP 172/74  Pulse 83  Temp(Src) 99.1 F (37.3 C) (Oral)  Resp 20  SpO2 96% Physical Exam  Nursing note and vitals reviewed. Constitutional: He is oriented to person, place, and time. He appears well-developed and well-nourished. No distress.  HENT:  Head: Normocephalic and atraumatic.  Mouth/Throat: Oropharynx is clear and moist.  Eyes: Conjunctivae and EOM are normal. Pupils are equal, round, and reactive to light. No scleral icterus.  Neck: Normal range of motion. Neck supple.  Cardiovascular: Normal rate, regular rhythm, normal heart sounds and intact distal pulses.   Pulmonary/Chest: Effort normal and breath sounds normal. No respiratory distress. He has no wheezes. He has no rales.  Abdominal: Soft.  Bowel sounds are normal. He exhibits no distension. There is generalized tenderness (worse mid-epigastric). There is guarding. There is no rigidity and no rebound.  Musculoskeletal: Normal range of motion. He exhibits no edema.  Neurological: He is alert and oriented to person, place, and time. He has normal strength. No cranial nerve deficit or sensory deficit.  Skin: Skin is warm and dry. No rash noted. He is not diaphoretic. No erythema.  Psychiatric: He has a normal mood and affect. His behavior is  normal.    ED Course  Procedures (including critical care time) Labs Reviewed  CBC - Abnormal; Notable for the following:    WBC 13.7 (*)    MCV 72.9 (*)    MCH 24.5 (*)    All other components within normal limits  COMPREHENSIVE METABOLIC PANEL - Abnormal; Notable for the following:    Sodium 133 (*)    Glucose, Bld 102 (*)    BUN 30 (*)    GFR calc non Af Amer 70 (*)    GFR calc Af Amer 81 (*)    All other components within normal limits  URINALYSIS, ROUTINE W REFLEX MICROSCOPIC - Abnormal; Notable for the following:    APPearance CLOUDY (*)    Leukocytes, UA LARGE (*)    All other components within normal limits  LIPASE, BLOOD  URINE MICROSCOPIC-ADD ON   Ct Head Wo Contrast  07/01/2013   *RADIOLOGY REPORT*  Clinical Data:  Headache, dizziness and blurred vision  CT HEAD WITHOUT CONTRAST  Technique:  Contiguous axial images were obtained from the base of the skull through the vertex without contrast  Comparison:  05/20/2013  Findings:  The brain has a normal appearance without evidence for hemorrhage, acute infarction, hydrocephalus, or mass lesion.  There is no extra axial fluid collection.  The skull and paranasal sinuses are normal.  IMPRESSION: Normal CT of the head without contrast.   Original Report Authenticated By: Amie Portland, M.D.   1. Headache   2. Urinary tract infection   3. Abdominal pain     MDM  Headache and abdominal pain. No red flags concerning patient's headache. Neuro exam unremarkable. Positive photophobia. Possible migraine. BP 172/74, mild fever of 99.1. Abdomen tender, guarding in mid-epigastric area. Cbc, cmp, lipase, ua, fluids, toradol, decadron, reglan, benadryl.  10:53 PM Patient with hx of brain tumor- will get CT head to r/o any changes in tumor. Patient also seen by Dr. Freida Busman. 11:54 PM Patient sleeping comfortably in room when entering to re-examine. Reports improved headache to 7/10 from 10/10. Abdominal pain beginning to subside. On palpation  of abdomen, patient states some improvement. Awaiting UA.  12:54 AM U/A showing large leukocytes, rare squamous cells, 11-20 WBC. Will treat for UTI with keflex. Stable for discharge. Return precautions discussed. Patient states understanding of plan and is agreeable.   Trevor Mace, PA-C 07/02/13 [phone]

## 2013-07-02 LAB — URINALYSIS, ROUTINE W REFLEX MICROSCOPIC
Bilirubin Urine: NEGATIVE
Hgb urine dipstick: NEGATIVE
Ketones, ur: NEGATIVE mg/dL
Nitrite: NEGATIVE
Protein, ur: NEGATIVE mg/dL
Specific Gravity, Urine: 1.013 (ref 1.005–1.030)
Urobilinogen, UA: 0.2 mg/dL (ref 0.0–1.0)

## 2013-07-02 LAB — URINE MICROSCOPIC-ADD ON

## 2013-07-02 MED ORDER — CEPHALEXIN 500 MG PO CAPS: 500.0000 mg | ORAL_CAPSULE | Freq: Four times a day (QID) | ORAL | Status: DC

## 2013-07-02 NOTE — ED Notes (Signed)
The pt  Is still getting his  Iv fluid asleep

## 2013-07-03 NOTE — ED Provider Notes (Signed)
Medical screening examination/treatment/procedure(s) were performed by non-physician practitioner and as supervising physician I was immediately available for consultation/collaboration.  Remer Couse T Malayshia All, MD 07/03/13 1942 

## 2013-10-17 ENCOUNTER — Emergency Department (HOSPITAL_COMMUNITY)
Admission: EM | Admit: 2013-10-17 | Discharge: 2013-10-17 | Disposition: A | Discharge status: Home / Self Care | Payer: Medicaid Other | Attending: Emergency Medicine | Admitting: Emergency Medicine

## 2013-10-17 ENCOUNTER — Emergency Department (HOSPITAL_COMMUNITY): Payer: Medicaid Other

## 2013-10-17 ENCOUNTER — Encounter (HOSPITAL_COMMUNITY): Payer: Self-pay | Admitting: Emergency Medicine

## 2013-10-17 DIAGNOSIS — Z8669 Personal history of other diseases of the nervous system and sense organs: Secondary | ICD-10-CM | POA: Insufficient documentation

## 2013-10-17 DIAGNOSIS — F17200 Nicotine dependence, unspecified, uncomplicated: Secondary | ICD-10-CM | POA: Insufficient documentation

## 2013-10-17 DIAGNOSIS — Z8673 Personal history of transient ischemic attack (TIA), and cerebral infarction without residual deficits: Secondary | ICD-10-CM | POA: Insufficient documentation

## 2013-10-17 DIAGNOSIS — R112 Nausea with vomiting, unspecified: Secondary | ICD-10-CM | POA: Insufficient documentation

## 2013-10-17 DIAGNOSIS — Z79899 Other long term (current) drug therapy: Secondary | ICD-10-CM | POA: Insufficient documentation

## 2013-10-17 DIAGNOSIS — Z8659 Personal history of other mental and behavioral disorders: Secondary | ICD-10-CM | POA: Insufficient documentation

## 2013-10-17 DIAGNOSIS — Z7982 Long term (current) use of aspirin: Secondary | ICD-10-CM | POA: Insufficient documentation

## 2013-10-17 DIAGNOSIS — E119 Type 2 diabetes mellitus without complications: Secondary | ICD-10-CM | POA: Insufficient documentation

## 2013-10-17 DIAGNOSIS — I509 Heart failure, unspecified: Secondary | ICD-10-CM | POA: Insufficient documentation

## 2013-10-17 DIAGNOSIS — N323 Diverticulum of bladder: Secondary | ICD-10-CM

## 2013-10-17 DIAGNOSIS — N390 Urinary tract infection, site not specified: Secondary | ICD-10-CM

## 2013-10-17 DIAGNOSIS — Z792 Long term (current) use of antibiotics: Secondary | ICD-10-CM | POA: Insufficient documentation

## 2013-10-17 DIAGNOSIS — I10 Essential (primary) hypertension: Secondary | ICD-10-CM | POA: Insufficient documentation

## 2013-10-17 HISTORY — DX: Heart failure, unspecified: I50.9

## 2013-10-17 LAB — URINE MICROSCOPIC-ADD ON

## 2013-10-17 LAB — URINALYSIS, ROUTINE W REFLEX MICROSCOPIC
Bilirubin Urine: NEGATIVE
Glucose, UA: NEGATIVE mg/dL
Ketones, ur: NEGATIVE mg/dL
Nitrite: NEGATIVE
Protein, ur: 100 mg/dL — AB
Specific Gravity, Urine: 1.016 (ref 1.005–1.030)
Urobilinogen, UA: 1 mg/dL (ref 0.0–1.0)
pH: 6.5 (ref 5.0–8.0)

## 2013-10-17 LAB — CBC WITH DIFFERENTIAL/PLATELET
Basophils Absolute: 0 10*3/uL (ref 0.0–0.1)
Basophils Relative: 0 % (ref 0–1)
Eosinophils Absolute: 0.2 K/uL (ref 0.0–0.7)
Eosinophils Relative: 2 % (ref 0–5)
HCT: 44.9 % (ref 39.0–52.0)
Hemoglobin: 14.8 g/dL (ref 13.0–17.0)
Lymphocytes Relative: 18 % (ref 12–46)
Lymphs Abs: 2 K/uL (ref 0.7–4.0)
MCH: 25.1 pg — ABNORMAL LOW (ref 26.0–34.0)
MCHC: 33 g/dL (ref 30.0–36.0)
MCV: 76.1 fL — ABNORMAL LOW (ref 78.0–100.0)
Monocytes Absolute: 0.5 10*3/uL (ref 0.1–1.0)
Monocytes Relative: 5 % (ref 3–12)
Neutro Abs: 8.4 10*3/uL — ABNORMAL HIGH (ref 1.7–7.7)
Neutrophils Relative %: 75 % (ref 43–77)
Platelets: 264 K/uL (ref 150–400)
RBC: 5.9 MIL/uL — ABNORMAL HIGH (ref 4.22–5.81)
RDW: 15.3 % (ref 11.5–15.5)
WBC: 11.1 K/uL — ABNORMAL HIGH (ref 4.0–10.5)

## 2013-10-17 LAB — COMPREHENSIVE METABOLIC PANEL WITH GFR
ALT: 28 U/L (ref 0–53)
Alkaline Phosphatase: 52 U/L (ref 39–117)
CO2: 25 meq/L (ref 19–32)
GFR calc Af Amer: >90 mL/min (ref >90)
Glucose, Bld: 166 mg/dL — ABNORMAL HIGH (ref 70–99)
Potassium: 4.2 meq/L (ref 3.5–5.1)
Sodium: 134 meq/L — ABNORMAL LOW (ref 135–145)
Total Protein: 8 g/dL (ref 6.0–8.3)

## 2013-10-17 LAB — COMPREHENSIVE METABOLIC PANEL
AST: 28 U/L (ref 0–37)
Albumin: 3.8 g/dL (ref 3.5–5.2)
BUN: 14 mg/dL (ref 6–23)
Calcium: 9.7 mg/dL (ref 8.4–10.5)
Chloride: 98 mEq/L (ref 96–112)
Creatinine, Ser: 0.94 mg/dL (ref 0.50–1.35)
GFR calc non Af Amer: 89 mL/min — ABNORMAL LOW (ref >90)
Total Bilirubin: 0.4 mg/dL (ref 0.3–1.2)

## 2013-10-17 LAB — LIPASE, BLOOD: Lipase: 29 U/L (ref 11–59)

## 2013-10-17 MED ORDER — CEFPODOXIME PROXETIL 200 MG PO TABS
200.0000 mg | ORAL_TABLET | Freq: Two times a day (BID) | ORAL | Status: DC
Start: 2013-10-17 — End: 2013-12-10

## 2013-10-17 MED ORDER — HYDROCODONE-ACETAMINOPHEN 5-325 MG PO TABS: 1.0000 | ORAL_TABLET | Freq: Once | ORAL | Status: DC

## 2013-10-17 MED ORDER — METOCLOPRAMIDE HCL 5 MG/ML IJ SOLN
10.0000 mg | Freq: Once | INTRAMUSCULAR | Status: AC
  Administered 2013-10-17: 10 mg via INTRAVENOUS
  Filled 2013-10-17: qty 2

## 2013-10-17 MED ORDER — MORPHINE SULFATE 4 MG/ML IJ SOLN
4.0000 mg | Freq: Once | INTRAMUSCULAR | Status: AC
  Administered 2013-10-17: 4 mg via INTRAVENOUS
  Filled 2013-10-17: qty 1

## 2013-10-17 MED ORDER — SODIUM CHLORIDE 0.9 % IV SOLN
Freq: Once | INTRAVENOUS | Status: AC
  Administered 2013-10-17: 18:00:00 via INTRAVENOUS

## 2013-10-17 MED ORDER — TRAMADOL HCL 50 MG PO TABS: 50.0000 mg | ORAL_TABLET | Freq: Four times a day (QID) | ORAL | Status: DC | PRN

## 2013-10-17 MED ORDER — IOHEXOL 300 MG/ML  SOLN
100.0000 mL | Freq: Once | INTRAMUSCULAR | Status: AC | PRN
  Administered 2013-10-17: 100 mL via INTRAVENOUS

## 2013-10-17 MED ORDER — ONDANSETRON 4 MG PO TBDP
8.0000 mg | ORAL_TABLET | Freq: Once | ORAL | Status: AC
  Administered 2013-10-17: 8 mg via ORAL
  Filled 2013-10-17: qty 2

## 2013-10-17 MED ORDER — CIPROFLOXACIN HCL 500 MG PO TABS: 500.0000 mg | ORAL_TABLET | Freq: Once | ORAL | Status: DC

## 2013-10-17 MED ORDER — DEXTROSE 5 % IV SOLN
1.0000 g | Freq: Once | INTRAVENOUS | Status: AC
  Administered 2013-10-17: 1 g via INTRAVENOUS
  Filled 2013-10-17: qty 10

## 2013-10-17 MED ORDER — PROMETHAZINE HCL 25 MG PO TABS: 25.0000 mg | ORAL_TABLET | Freq: Four times a day (QID) | ORAL | Status: DC | PRN

## 2013-10-17 MED ORDER — IOHEXOL 300 MG/ML  SOLN
25.0000 mL | INTRAMUSCULAR | Status: DC
  Administered 2013-10-17: 25 mL via ORAL

## 2013-10-17 NOTE — ED Notes (Signed)
Pt to ED for evaluation of lower abdominal pain onset 2 weeks ago.  Pt completed antibiotics a day ago for a bladder infection- admits to still having pain with urination.  Denies any penile discharge.

## 2013-10-17 NOTE — ED Provider Notes (Signed)
CSN: [id number]     Arrival date & time 10/17/13  1319 History   First MD Initiated Contact with Patient 10/17/13 1703     Chief Complaint  Patient presents with  . Abdominal Pain  . Emesis   (Consider location/radiation/quality/duration/timing/severity/associated sxs/prior Treatment) Patient is a 62 y.o. male presenting with abdominal pain and vomiting. The history is provided by the patient.  Abdominal Pain Pain location:  Suprapubic Pain quality: aching and pressure   Pain radiates to:  Does not radiate Pain severity:  Moderate Onset quality:  Gradual Duration:  3 weeks Timing:  Sporadic Progression:  Unchanged Chronicity:  New Context: not diet changes, not laxative use and not previous surgeries   Worsened by:  Bowel movements and urination Ineffective treatments:  Urination and vomiting (3 weeks of antibitoics, unknown type) Associated symptoms: dysuria, nausea and vomiting   Associated symptoms: no chills, no constipation, no diarrhea and no fever   Emesis Associated symptoms: abdominal pain   Associated symptoms: no chills and no diarrhea     Past Medical History  Diagnosis Date  . Hypertension   . Diabetes mellitus   . Brain tumor   . CVA (cerebral vascular accident)   . Narcotic abuse   . CHF (congestive heart failure)    History reviewed. No pertinent past surgical history. History reviewed. No pertinent family history. History  Substance Use Topics  . Smoking status: Current Every Day Smoker  . Smokeless tobacco: Not on file  . Alcohol Use: Yes     Comment: 6 pack a week    Review of Systems  Constitutional: Negative for fever and chills.  Gastrointestinal: Positive for nausea, vomiting and abdominal pain. Negative for diarrhea and constipation.  Genitourinary: Positive for dysuria. Negative for frequency, flank pain, discharge, difficulty urinating and genital sores.  Skin: Negative for rash.  All other systems reviewed and are  negative.    Allergies  Review of patient's allergies indicates no known allergies.  Home Medications   Current Outpatient Rx  Name  Route  Sig  Dispense  Refill  . amLODipine (NORVASC) 10 MG tablet   Oral   Take 10 mg by mouth daily.         Marland Kitchen aspirin EC 81 MG tablet   Oral   Take 81 mg by mouth daily.         Marland Kitchen lisinopril (PRINIVIL,ZESTRIL) 20 MG tablet   Oral   Take 20 mg by mouth daily.         . metFORMIN (GLUCOPHAGE) 500 MG tablet   Oral   Take 500 mg by mouth 2 (two) times daily with a meal.         . oxyCODONE-acetaminophen (PERCOCET/ROXICET) 5-325 MG per tablet   Oral   Take 1 tablet by mouth every 4 (four) hours as needed for pain.         . pravastatin (PRAVACHOL) 20 MG tablet   Oral   Take 20 mg by mouth daily.         . cefpodoxime (VANTIN) 200 MG tablet   Oral   Take 1 tablet (200 mg total) by mouth 2 (two) times daily.   14 tablet   0   . promethazine (PHENERGAN) 25 MG tablet   Oral   Take 1 tablet (25 mg total) by mouth every 6 (six) hours as needed for nausea.   20 tablet   0   . traMADol (ULTRAM) 50 MG tablet   Oral   Take  1 tablet (50 mg total) by mouth every 6 (six) hours as needed for pain.   15 tablet   0    BP 182/66  Pulse 62  Temp(Src) 98.2 F (36.8 C) (Oral)  Resp 16  Wt 168 lb 6.4 oz (76.386 kg)  BMI 24.86 kg/m2  SpO2 97% Physical Exam  Nursing note and vitals reviewed. Constitutional: He is oriented to person, place, and time. He appears well-developed and well-nourished. No distress.  HENT:  Head: Normocephalic and atraumatic.  Eyes: EOM are normal.  Neck: Neck supple.  Cardiovascular: Normal rate, regular rhythm and intact distal pulses.   Pulmonary/Chest: Effort normal. No respiratory distress.  Abdominal: Soft. Normal appearance. He exhibits no distension. There is tenderness in the right upper quadrant, right lower quadrant, suprapubic area and left upper quadrant. There is no rebound and no guarding.     Genitourinary: Penis normal. Rectal exam shows tenderness. Rectal exam shows no external hemorrhoid and no fissure. Prostate is tender.  No gross blood per rectum  Neurological: He is alert and oriented to person, place, and time. Coordination normal.  Skin: Skin is warm and dry. No rash noted.  Psychiatric: He has a normal mood and affect.    ED Course  Procedures (including critical care time) Labs Review Labs Reviewed  CBC WITH DIFFERENTIAL - Abnormal; Notable for the following:    WBC 11.1 (*)    RBC 5.90 (*)    MCV 76.1 (*)    MCH 25.1 (*)    Neutro Abs 8.4 (*)    All other components within normal limits  COMPREHENSIVE METABOLIC PANEL - Abnormal; Notable for the following:    Sodium 134 (*)    Glucose, Bld 166 (*)    GFR calc non Af Amer 89 (*)    All other components within normal limits  URINALYSIS, ROUTINE W REFLEX MICROSCOPIC - Abnormal; Notable for the following:    APPearance TURBID (*)    Hgb urine dipstick MODERATE (*)    Protein, ur 100 (*)    Leukocytes, UA LARGE (*)    All other components within normal limits  URINE MICROSCOPIC-ADD ON - Abnormal; Notable for the following:    Squamous Epithelial / LPF MANY (*)    Bacteria, UA FEW (*)    All other components within normal limits  URINE CULTURE  LIPASE, BLOOD   Imaging Review Ct Abdomen Pelvis W Contrast  10/17/2013   CLINICAL DATA:  Abdominal pain with nausea and vomiting.  EXAM: CT ABDOMEN AND PELVIS WITH CONTRAST  TECHNIQUE: Multidetector CT imaging of the abdomen and pelvis was performed using the standard protocol following bolus administration of intravenous contrast.  CONTRAST:  OMNIPAQUE IOHEXOL 300 MG/ML  SOLN  COMPARISON:  CT 07/21/2007, CT 10/24/2003  FINDINGS: Lung bases are clear.  2.2 cm cyst in the left lobe of the liver is unchanged from prior studies. No liver mass identified. Gallbladder and bile ducts are normal. Pancreas and spleen are normal. Kidneys are normal without  obstruction, mass, or calculus.  Negative for bowel obstruction or bowel thickening. Appendix is normal. Colonic diverticulosis without diverticulitis.  Urinary bladder is distended and has a markedly thickened bladder wall. Bladder diverticula noted lateral to the bladder on the left and superior to the dome of the bladder on the right. Prostate is mildly enlarged.  No acute bony changes.  IMPRESSION: Findings consistent with neurogenic bladder or bladder outlet obstruction with bladder wall thickening and bladder diverticula.  No acute abnormality.  Electronically Signed   By: Marlan Palau M.D.   On: 10/17/2013 19:21    EKG Interpretation   None      ra sat is 98% and I interpret to be adequate  6:41 PM UA has come back suspicous for UTI.  Culture sent.  Will start on IV abx.      7:42 PM Possible bladder outlet dysfunction, pt at risk for freq or recurring UTI.  Will give Rx for pain, nausea and 3rd gen cephalosporin and refer to Urology for follow up.  MDM   1. Urinary tract infection   2. Diverticula, bladder       Pt reportedly on 3-4 weeks of abx for this issue.  Pain is not improving, has had N/V once yesterday.  Pt's pain continues.  Pt is tender at prostate on DRE.  No fever.  Given has had abx for 3 weeks and not improving, age, will get abd/pelvic CT.  IV meds for pain and nausea.      Gavin Pound. Oletta Lamas, MD 10/17/13 0981

## 2013-10-17 NOTE — ED Notes (Signed)
CT paged. 

## 2013-10-17 NOTE — ED Notes (Signed)
Pt c/o lower abd pain with N/V x 2 days; pt noted to have chocolate chip cookies and juice; told pt nothing PO until sees EDP and admits to hx of DM

## 2013-10-17 NOTE — Discharge Instructions (Signed)
Urinary Tract Infection  Urinary tract infections (UTIs) can develop anywhere along your urinary tract. Your urinary tract is your body's drainage system for removing wastes and extra water. Your urinary tract includes two kidneys, two ureters, a bladder, and a urethra. Your kidneys are a pair of bean-shaped organs. Each kidney is about the size of your fist. They are located below your ribs, one on each side of your spine.  CAUSES  Infections are caused by microbes, which are microscopic organisms, including fungi, viruses, and bacteria. These organisms are so small that they can only be seen through a microscope. Bacteria are the microbes that most commonly cause UTIs.  SYMPTOMS   Symptoms of UTIs may vary by age and gender of the patient and by the location of the infection. Symptoms in young women typically include a frequent and intense urge to urinate and a painful, burning feeling in the bladder or urethra during urination. Older women and men are more likely to be tired, shaky, and weak and have muscle aches and abdominal pain. A fever may mean the infection is in your kidneys. Other symptoms of a kidney infection include pain in your back or sides below the ribs, nausea, and vomiting.  DIAGNOSIS  To diagnose a UTI, your caregiver will ask you about your symptoms. Your caregiver also will ask to provide a urine sample. The urine sample will be tested for bacteria and white blood cells. White blood cells are made by your body to help fight infection.  TREATMENT   Typically, UTIs can be treated with medication. Because most UTIs are caused by a bacterial infection, they usually can be treated with the use of antibiotics. The choice of antibiotic and length of treatment depend on your symptoms and the type of bacteria causing your infection.  HOME CARE INSTRUCTIONS   If you were prescribed antibiotics, take them exactly as your caregiver instructs you. Finish the medication even if you feel better after you  have only taken some of the medication.   Drink enough water and fluids to keep your urine clear or pale yellow.   Avoid caffeine, tea, and carbonated beverages. They tend to irritate your bladder.   Empty your bladder often. Avoid holding urine for long periods of time.   Empty your bladder before and after sexual intercourse.   After a bowel movement, women should cleanse from front to back. Use each tissue only once.  SEEK MEDICAL CARE IF:    You have back pain.   You develop a fever.   Your symptoms do not begin to resolve within 3 days.  SEEK IMMEDIATE MEDICAL CARE IF:    You have severe back pain or lower abdominal pain.   You develop chills.   You have nausea or vomiting.   You have continued burning or discomfort with urination.  MAKE SURE YOU:    Understand these instructions.   Will watch your condition.   Will get help right away if you are not doing well or get worse.  Document Released: 09/24/2005 Document Revised: 06/15/2012 Document Reviewed: 01/23/2012  [employer] Patient Information 2014 [employer]

## 2013-10-17 NOTE — ED Notes (Signed)
Patient transported to CT 

## 2013-10-19 LAB — URINE CULTURE
Colony Count: >=100000
Special Requests: NORMAL

## 2013-10-19 NOTE — Progress Notes (Signed)
ED Antimicrobial Stewardship Positive Culture Follow Up   [name] is an 62 y.o. male who presented to [facility] on 10/17/2013 with a chief complaint of  Chief Complaint  Patient presents with  . Abdominal Pain  . Emesis    Recent Results (from the past 720 hour(s))  URINE CULTURE     Status: None   Collection Time    10/17/13  5:42 PM      Result Value Range Status   Specimen Description URINE, CLEAN CATCH   Final   Special Requests Normal   Final   Culture  Setup Time     Final   Value: 10/17/2013 18:56     Performed at [facility] Count     Final   Value: >=100,000 COLONIES/ML     Performed at [facility]   Culture     Final   Value: ENTEROCOCCUS SPECIES     Performed at [facility]   Report Status 10/19/2013 FINAL   Final   Organism ID, Bacteria ENTEROCOCCUS SPECIES   Final    [x]  Treated with cefpodoxime, organism resistant to prescribed antimicrobial []  Patient discharged originally without antimicrobial agent and treatment is now indicated  New antibiotic prescription: Stop taking cefpodoxime  Start Amoxicillin 500mg  TID x7 days  ED Provider: Junious Silk, PA   Ulyses Southward [place] 10/19/2013, 10:07 AM Infectious Diseases Pharmacist Phone# [phone]

## 2013-10-19 NOTE — ED Notes (Signed)
Post ED Visit - Positive Culture Follow-up: Successful Patient Follow-Up  Culture assessed and recommendations reviewed by: []  Wes Dulaney, Pharm.D., BCPS []  Celedonio Miyamoto, Pharm.D., BCPS []  Georgina Pillion, Pharm.D., BCPS [x]  [place], [place].D., BCPS, AAHIVP []  Estella Husk, Pharm.D., BCPS, AAHIVP  Positive Urine culture  []  Patient discharged without antimicrobial prescription and treatment is now indicated [x]  Organism is resistant to prescribed ED discharge antimicrobial []  Patient with positive blood cultures  Changes discussed with ED provider:[name] New antibiotic prescription Amoxicillin 500 mg TID x 7 days needs to be called to pharmacy Called to [facility] [phone] Contacted patient-Patient contacted  [name] 10/19/2013, 3:05 PM

## 2013-12-10 ENCOUNTER — Emergency Department (HOSPITAL_COMMUNITY): Payer: Medicaid Other

## 2013-12-10 ENCOUNTER — Observation Stay (HOSPITAL_COMMUNITY)
Admission: EM | Admit: 2013-12-10 | Discharge: 2013-12-10 | Disposition: A | Discharge status: Home / Self Care | Payer: Medicaid Other | Attending: Internal Medicine | Admitting: Internal Medicine

## 2013-12-10 ENCOUNTER — Encounter (HOSPITAL_COMMUNITY): Payer: Self-pay | Admitting: Emergency Medicine

## 2013-12-10 DIAGNOSIS — F17200 Nicotine dependence, unspecified, uncomplicated: Secondary | ICD-10-CM | POA: Diagnosis present

## 2013-12-10 DIAGNOSIS — Z86011 Personal history of benign neoplasm of the brain: Secondary | ICD-10-CM | POA: Insufficient documentation

## 2013-12-10 DIAGNOSIS — I509 Heart failure, unspecified: Secondary | ICD-10-CM | POA: Insufficient documentation

## 2013-12-10 DIAGNOSIS — D571 Sickle-cell disease without crisis: Secondary | ICD-10-CM | POA: Insufficient documentation

## 2013-12-10 DIAGNOSIS — F1020 Alcohol dependence, uncomplicated: Secondary | ICD-10-CM

## 2013-12-10 DIAGNOSIS — D72829 Elevated white blood cell count, unspecified: Secondary | ICD-10-CM

## 2013-12-10 DIAGNOSIS — IMO0001 Reserved for inherently not codable concepts without codable children: Secondary | ICD-10-CM | POA: Insufficient documentation

## 2013-12-10 DIAGNOSIS — IMO0002 Reserved for concepts with insufficient information to code with codable children: Secondary | ICD-10-CM

## 2013-12-10 DIAGNOSIS — Z859 Personal history of malignant neoplasm, unspecified: Secondary | ICD-10-CM | POA: Insufficient documentation

## 2013-12-10 DIAGNOSIS — E1165 Type 2 diabetes mellitus with hyperglycemia: Secondary | ICD-10-CM

## 2013-12-10 DIAGNOSIS — Z8673 Personal history of transient ischemic attack (TIA), and cerebral infarction without residual deficits: Secondary | ICD-10-CM | POA: Insufficient documentation

## 2013-12-10 DIAGNOSIS — Z79899 Other long term (current) drug therapy: Secondary | ICD-10-CM | POA: Insufficient documentation

## 2013-12-10 DIAGNOSIS — M310 Hypersensitivity angiitis: Secondary | ICD-10-CM | POA: Insufficient documentation

## 2013-12-10 DIAGNOSIS — J45909 Unspecified asthma, uncomplicated: Secondary | ICD-10-CM | POA: Insufficient documentation

## 2013-12-10 DIAGNOSIS — E119 Type 2 diabetes mellitus without complications: Secondary | ICD-10-CM | POA: Diagnosis present

## 2013-12-10 DIAGNOSIS — R079 Chest pain, unspecified: Secondary | ICD-10-CM | POA: Diagnosis present

## 2013-12-10 DIAGNOSIS — C9000 Multiple myeloma not having achieved remission: Secondary | ICD-10-CM | POA: Insufficient documentation

## 2013-12-10 DIAGNOSIS — I10 Essential (primary) hypertension: Secondary | ICD-10-CM | POA: Diagnosis present

## 2013-12-10 DIAGNOSIS — Z7982 Long term (current) use of aspirin: Secondary | ICD-10-CM | POA: Insufficient documentation

## 2013-12-10 DIAGNOSIS — R110 Nausea: Secondary | ICD-10-CM | POA: Insufficient documentation

## 2013-12-10 DIAGNOSIS — N390 Urinary tract infection, site not specified: Secondary | ICD-10-CM

## 2013-12-10 DIAGNOSIS — R0789 Other chest pain: Principal | ICD-10-CM | POA: Insufficient documentation

## 2013-12-10 HISTORY — DX: Alcohol dependence, uncomplicated: F10.20

## 2013-12-10 HISTORY — DX: Nicotine dependence, unspecified, uncomplicated: F17.200

## 2013-12-10 LAB — URINALYSIS W MICROSCOPIC + REFLEX CULTURE
Glucose, UA: NEGATIVE mg/dL
Ketones, ur: NEGATIVE mg/dL
Nitrite: NEGATIVE
Protein, ur: NEGATIVE mg/dL

## 2013-12-10 LAB — POCT I-STAT TROPONIN I: Troponin i, poc: 0 ng/mL (ref 0.00–0.08)

## 2013-12-10 LAB — RAPID URINE DRUG SCREEN, HOSP PERFORMED
Amphetamines: NOT DETECTED
Barbiturates: NOT DETECTED
Benzodiazepines: NOT DETECTED
Cocaine: NOT DETECTED

## 2013-12-10 LAB — BASIC METABOLIC PANEL
BUN: 16 mg/dL (ref 6–23)
CO2: 22 mEq/L (ref 19–32)
Calcium: 9.4 mg/dL (ref 8.4–10.5)
Calcium: 9 mg/dL (ref 8.4–10.5)
Chloride: 103 mEq/L (ref 96–112)
Chloride: 104 mEq/L (ref 96–112)
Creatinine, Ser: 1 mg/dL (ref 0.50–1.35)
Creatinine, Ser: 0.88 mg/dL (ref 0.50–1.35)
GFR calc Af Amer: >90 mL/min (ref >90)
GFR calc non Af Amer: 80 mL/min — ABNORMAL LOW (ref >90)
Glucose, Bld: 152 mg/dL — ABNORMAL HIGH (ref 70–99)
Glucose, Bld: 92 mg/dL (ref 70–99)
Potassium: 4 mEq/L (ref 3.5–5.1)
Sodium: 140 mEq/L (ref 135–145)

## 2013-12-10 LAB — CBC
HCT: 39.4 % (ref 39.0–52.0)
Hemoglobin: 14.3 g/dL (ref 13.0–17.0)
MCH: 26 pg (ref 26.0–34.0)
MCH: 25.5 pg — ABNORMAL LOW (ref 26.0–34.0)
MCHC: 33.5 g/dL (ref 30.0–36.0)
MCV: 76.2 fL — ABNORMAL LOW (ref 78.0–100.0)
MCV: 76.2 fL — ABNORMAL LOW (ref 78.0–100.0)
Platelets: 266 10*3/uL (ref 150–400)
Platelets: 252 10*3/uL (ref 150–400)
RBC: 5.5 MIL/uL (ref 4.22–5.81)
RBC: 5.17 MIL/uL (ref 4.22–5.81)
RDW: 14.4 % (ref 11.5–15.5)
WBC: 16.2 10*3/uL — ABNORMAL HIGH (ref 4.0–10.5)
WBC: 12.1 10*3/uL — ABNORMAL HIGH (ref 4.0–10.5)

## 2013-12-10 LAB — LIPID PANEL
LDL Cholesterol: 81 mg/dL (ref 0–99)
Triglycerides: 49 mg/dL (ref <150)
VLDL: 10 mg/dL (ref 0–40)

## 2013-12-10 LAB — TROPONIN I
Troponin I: <0.3 ng/mL (ref <0.30)
Troponin I: <0.3 ng/mL (ref <0.30)

## 2013-12-10 LAB — GLUCOSE, CAPILLARY
Glucose-Capillary: 90 mg/dL (ref 70–99)
Glucose-Capillary: 70 mg/dL (ref 70–99)
Glucose-Capillary: 79 mg/dL (ref 70–99)

## 2013-12-10 LAB — HEMOGLOBIN A1C: Mean Plasma Glucose: 126 mg/dL — ABNORMAL HIGH (ref <117)

## 2013-12-10 LAB — PRO B NATRIURETIC PEPTIDE: Pro B Natriuretic peptide (BNP): 51.9 pg/mL (ref 0–125)

## 2013-12-10 MED ORDER — MORPHINE SULFATE 4 MG/ML IJ SOLN
4.0000 mg | Freq: Once | INTRAMUSCULAR | Status: AC
  Administered 2013-12-10: 4 mg via INTRAVENOUS
  Filled 2013-12-10: qty 1

## 2013-12-10 MED ORDER — PANTOPRAZOLE SODIUM 40 MG PO TBEC
40.0000 mg | DELAYED_RELEASE_TABLET | Freq: Every day | ORAL | Status: DC
  Administered 2013-12-10: 40 mg via ORAL
  Filled 2013-12-10: qty 1

## 2013-12-10 MED ORDER — CIPROFLOXACIN HCL 250 MG PO TABS
250.0000 mg | ORAL_TABLET | Freq: Two times a day (BID) | ORAL | Status: DC
  Administered 2013-12-10: 250 mg via ORAL
  Filled 2013-12-10 (×2): qty 1

## 2013-12-10 MED ORDER — ACETAMINOPHEN 325 MG PO TABS: 650.0000 mg | ORAL_TABLET | Freq: Four times a day (QID) | ORAL | Status: DC | PRN

## 2013-12-10 MED ORDER — ONDANSETRON HCL 4 MG/2ML IJ SOLN: 4.0000 mg | Freq: Four times a day (QID) | INTRAMUSCULAR | Status: DC | PRN

## 2013-12-10 MED ORDER — ONDANSETRON HCL 4 MG PO TABS: 4.0000 mg | ORAL_TABLET | Freq: Four times a day (QID) | ORAL | Status: DC | PRN

## 2013-12-10 MED ORDER — NICOTINE 14 MG/24HR TD PT24
14.0000 mg | MEDICATED_PATCH | TRANSDERMAL | Status: DC
  Administered 2013-12-10: 14 mg via TRANSDERMAL
  Filled 2013-12-10: qty 1

## 2013-12-10 MED ORDER — LISINOPRIL 10 MG PO TABS: 10.0000 mg | ORAL_TABLET | Freq: Every day | ORAL | Status: DC

## 2013-12-10 MED ORDER — INSULIN ASPART 100 UNIT/ML ~~LOC~~ SOLN: 0.0000 [IU] | Freq: Three times a day (TID) | SUBCUTANEOUS | Status: DC

## 2013-12-10 MED ORDER — SIMVASTATIN 10 MG PO TABS
10.0000 mg | ORAL_TABLET | Freq: Every day | ORAL | Status: DC
  Administered 2013-12-10: 10 mg via ORAL
  Filled 2013-12-10: qty 1

## 2013-12-10 MED ORDER — CIPROFLOXACIN HCL 250 MG PO TABS: 250.0000 mg | ORAL_TABLET | Freq: Two times a day (BID) | ORAL | Status: DC

## 2013-12-10 MED ORDER — ASPIRIN 325 MG PO TABS
325.0000 mg | ORAL_TABLET | Freq: Every day | ORAL | Status: DC
  Administered 2013-12-10: 325 mg via ORAL
  Filled 2013-12-10: qty 1

## 2013-12-10 MED ORDER — OXYCODONE HCL 5 MG PO TABS: 5.0000 mg | ORAL_TABLET | ORAL | Status: DC | PRN

## 2013-12-10 MED ORDER — ALUM & MAG HYDROXIDE-SIMETH 200-200-20 MG/5ML PO SUSP: 30.0000 mL | Freq: Four times a day (QID) | ORAL | Status: DC | PRN

## 2013-12-10 MED ORDER — HYDROMORPHONE HCL PF 1 MG/ML IJ SOLN: 0.5000 mg | INTRAMUSCULAR | Status: DC | PRN

## 2013-12-10 MED ORDER — DEXTROSE 50 % IV SOLN
INTRAVENOUS | Status: AC
  Administered 2013-12-10: 50 mL
  Filled 2013-12-10: qty 50

## 2013-12-10 MED ORDER — ACETAMINOPHEN 650 MG RE SUPP: 650.0000 mg | Freq: Four times a day (QID) | RECTAL | Status: DC | PRN

## 2013-12-10 MED ORDER — LISINOPRIL 20 MG PO TABS
20.0000 mg | ORAL_TABLET | Freq: Every day | ORAL | Status: DC
  Administered 2013-12-10: 10 mg via ORAL
  Filled 2013-12-10: qty 1

## 2013-12-10 MED ORDER — ENOXAPARIN SODIUM 40 MG/0.4ML ~~LOC~~ SOLN
40.0000 mg | SUBCUTANEOUS | Status: DC
Start: 2013-12-10 — End: 2013-12-10
  Administered 2013-12-10: 40 mg via SUBCUTANEOUS
  Filled 2013-12-10: qty 0.4

## 2013-12-10 MED ORDER — AMLODIPINE BESYLATE 10 MG PO TABS
10.0000 mg | ORAL_TABLET | Freq: Every day | ORAL | Status: DC
  Administered 2013-12-10: 10 mg via ORAL
  Filled 2013-12-10: qty 1

## 2013-12-10 MED ORDER — ASPIRIN 81 MG PO CHEW
324.0000 mg | CHEWABLE_TABLET | Freq: Once | ORAL | Status: AC
  Administered 2013-12-10: 324 mg via ORAL
  Filled 2013-12-10: qty 4

## 2013-12-10 MED ORDER — INSULIN ASPART 100 UNIT/ML ~~LOC~~ SOLN: 0.0000 [IU] | Freq: Every day | SUBCUTANEOUS | Status: DC

## 2013-12-10 MED ORDER — NICOTINE 14 MG/24HR TD PT24
14.0000 mg | MEDICATED_PATCH | Freq: Every day | TRANSDERMAL | Status: DC
  Filled 2013-12-10: qty 1

## 2013-12-10 MED ORDER — ZOLPIDEM TARTRATE 5 MG PO TABS: 5.0000 mg | ORAL_TABLET | Freq: Every evening | ORAL | Status: DC | PRN

## 2013-12-10 MED ORDER — SODIUM CHLORIDE 0.9 % IV SOLN: INTRAVENOUS | Status: DC

## 2013-12-10 MED ORDER — METFORMIN HCL 500 MG PO TABS
500.0000 mg | ORAL_TABLET | Freq: Two times a day (BID) | ORAL | Status: DC
  Filled 2013-12-10 (×3): qty 1

## 2013-12-10 MED ORDER — GI COCKTAIL ~~LOC~~
30.0000 mL | Freq: Once | ORAL | Status: AC
  Administered 2013-12-10: 30 mL via ORAL
  Filled 2013-12-10: qty 30

## 2013-12-10 MED ORDER — ESMOLOL HCL-SODIUM CHLORIDE 2000 MG/100ML IV SOLN: 25.0000 ug/kg/min | INTRAVENOUS | Status: DC

## 2013-12-10 MED ORDER — NITROGLYCERIN 2 % TD OINT
0.5000 [in_us] | TOPICAL_OINTMENT | Freq: Four times a day (QID) | TRANSDERMAL | Status: DC
  Administered 2013-12-10 (×2): 0.5 [in_us] via TOPICAL
  Filled 2013-12-10 (×2): qty 30

## 2013-12-10 NOTE — Care Management Note (Signed)
    Page 1 of 1   12/10/2013     9:47:05 AM   CARE MANAGEMENT NOTE 12/10/2013  Patient:  [name], [name]   Account Number:  [id number]  Date Initiated:  12/10/2013  Documentation initiated by:  GRAVES-BIGELOW,Oshae Simmering  Subjective/Objective Assessment:   Pt admitted with chest pain.     Action/Plan:   CM received referral for medication assistance. Pt has medicaid coverage, therefore CM will not be ale to assist with medications. CM did call [facility] [address] to see if the fee could be waived. Spoke to [employer].   Anticipated DC Date:  12/10/2013   Anticipated DC Plan:  HOME/SELF CARE      DC Planning Services  CM consult  Medication Assistance      Choice offered to / List presented to:             Status of service:  Completed, signed off Medicare Important Message given?   (If response is "NO", the following Medicare IM given date fields will be blank) Date Medicare IM given:   Date Additional Medicare IM given:    Discharge Disposition:  HOME/SELF CARE  Per UR Regulation:  Reviewed for med. necessity/level of care/duration of stay  If discussed at Long Length of Stay Meetings, dates discussed:    Comments:  [facility] will waive the medication fee. Pt has to ask for fee to be waived and he is aware. No further needs from CM at this time.

## 2013-12-10 NOTE — Discharge Summary (Signed)
Physician Discharge Summary  [name] NFA:[id number] DOB: 08/10/1952 DOA: 12/10/2013  PCP: Dorrene German, MD  Admit date: 12/10/2013 Discharge date: 12/10/2013  Recommendations for Outpatient Follow-up:  1. Pt will need to follow up with PCP in 1 week post discharge 2. Please follow up on urine culture from 12/10/13 and adjust oral antibiotics if necessary   Discharge Diagnoses:  Principal Problem:   Chest pain Active Problems:   HTN (hypertension), malignant   Diabetes mellitus type 2, uncontrolled   Tobacco use disorder   Alcohol dependence   Leukocytosis, unspecified Chest discomfort  -EKG is reassuring without ischemic changes -Consulted cardiology --Dr. Eden Emms saw pt and felt pt can have stress myoview in the outpatient setting due to atypical nature of patient's chest pain -troponin negative x 3 -Continue aspirin  Pyuria -pt was started on empiric ciprofloxacin -pt was instructed to contact and/or followup with his PCP to adjust abx if necessary based upon culture data -Please follow up on urine culture obtained on 12/10/13--adjust antibiotics if necessary Hypertension  -Continue lisinopril and amlodipine  -BP is controlled  Diabetes mellitus type 2  -NovoLog sliding scale  -12/13//2014 hemoglobin A1c 6.0  Hyperlipidemia  -Continue statin  -Check lipids--LDL 81 Tobacco use  -Tobacco cessation discussed  -Nicoderm patch  Leukocytosis  -Afebrile and hemodynamically stable  - Chest x-ray is negative for infiltrates  -Urinalysis--shows pyuria Alcohol use  -Alcohol drug protocol  -UDS--shows Cannabis   Discharge Condition: stable  Disposition: home  Diet:heart healthy Wt Readings from Last 3 Encounters:  12/10/13 73.211 kg (161 lb 6.4 oz)  10/17/13 76.386 kg (168 lb 6.4 oz)  09/06/12 75.6 kg (166 lb 10.7 oz)   Brief history  62 year old male with a history of diabetes mellitus, hypertension, stroke, hyperlipidemia presents with chest discomfort  that occurred around 10 PM on 12/09/2013. The patient states that he was having a couple drinks of Gin when he experienced chest discomfort. This was associated with some nausea with shortness of breath. He states that over the past several weeks he has had some chest discomfort with palpitations. In addition, the patient states that when he mows the lawn with his push mower he experiences some chest discomfort and palpitations. He states that he drinks 3 times per month but denies any other illegal drug use. The patient states that he smokes one half pack to one pack per day for the past 45 years. His father also had a myocardial infarction at age 65. Currently, the patient denies any chest discomfort but has some shortness of breath. He denies any coughing or hemoptysis. Denies any previous history of malignancy. No history of long travels or, or recent surgery. Troponins neg x 3.      Consultants: Cardiology--Dr. Eden Emms  Discharge Exam: Filed Vitals:   12/10/13 0422  BP: 121/54  Pulse:   Temp:   Resp:    Filed Vitals:   12/10/13 0102 12/10/13 0230 12/10/13 0336 12/10/13 0422  BP:  119/66 92/73 121/54  Pulse:  89 89   Temp:   98 F (36.7 C)   TempSrc:      Resp:  20 18   Height:   5\' 9"  (1.753 m)   Weight:   73.211 kg (161 lb 6.4 oz)   SpO2: 98% 90% 99%    General: A&O x 3, NAD, pleasant, cooperative Cardiovascular: RRR, no rub, no gallop, no S3 Respiratory: CTAB, no wheeze, no rhonchi Abdomen:soft, nontender, nondistended, positive bowel sounds Extremities: No edema, No lymphangitis, no petechiae  Discharge Instructions     Medication List         amLODipine 10 MG tablet  Commonly known as:  NORVASC  Take 10 mg by mouth daily.     aspirin EC 81 MG tablet  Take 81 mg by mouth daily.     b complex vitamins tablet  Take 1 tablet by mouth daily.     ciprofloxacin 250 MG tablet  Commonly known as:  CIPRO  Take 1 tablet (250 mg total) by mouth 2 (two) times daily.      lisinopril 20 MG tablet  Commonly known as:  PRINIVIL,ZESTRIL  Take 20 mg by mouth daily.     metFORMIN 500 MG tablet  Commonly known as:  GLUCOPHAGE  Take 500 mg by mouth 2 (two) times daily with a meal.     pravastatin 20 MG tablet  Commonly known as:  PRAVACHOL  Take 20 mg by mouth daily.     traMADol 50 MG tablet  Commonly known as:  ULTRAM  Take 1 tablet (50 mg total) by mouth every 6 (six) hours as needed for pain.         The results of significant diagnostics from this hospitalization (including imaging, microbiology, ancillary and laboratory) are listed below for reference.    Significant Diagnostic Studies: Dg Chest 2 View  12/10/2013   CLINICAL DATA:  Chest pain and shortness of breath.  EXAM: CHEST  2 VIEW  COMPARISON:  05/19/2013.  FINDINGS: The heart size and mediastinal contours are within normal limits. Both lungs are clear. The visualized skeletal structures are unremarkable.  IMPRESSION: No active cardiopulmonary disease.  No change from priors.   Electronically Signed   By: Davonna Belling M.D.   On: 12/10/2013 01:45     Microbiology: No results found for this or any previous visit (from the past 240 hour(s)).   Labs: Basic Metabolic Panel:  Recent Labs Lab 12/10/13 0017 12/10/13 0530  NA 140 141  K 3.6 4.0  CL 103 104  CO2 22 21  GLUCOSE 152* 92  BUN 17 16  CREATININE 1.00 0.88  CALCIUM 9.4 9.0   Liver Function Tests: No results found for this basename: AST, ALT, ALKPHOS, BILITOT, PROT, ALBUMIN,  in the last 168 hours No results found for this basename: LIPASE, AMYLASE,  in the last 168 hours No results found for this basename: AMMONIA,  in the last 168 hours CBC:  Recent Labs Lab 12/10/13 0017 12/10/13 0530  WBC 16.2* 12.1*  HGB 14.3 13.2  HCT 41.9 39.4  MCV 76.2* 76.2*  PLT 266 252   Cardiac Enzymes:  Recent Labs Lab 12/10/13 0303 12/10/13 0935 12/10/13 1455  TROPONINI <0.30 <0.30 <0.30   BNP: No components found with  this basename: POCBNP,  CBG:  Recent Labs Lab 12/10/13 0803 12/10/13 1031 12/10/13 1143 12/10/13 1213  GLUCAP 90 70 79 77    Time coordinating discharge:  Greater than 30 minutes  Signed:  Aneli Zara, DO [facility] Pager: [phone] 12/10/2013, 4:07 PM

## 2013-12-10 NOTE — Progress Notes (Signed)
[facility] progress note. Chief complaint. Chest pain. History of present illness. This 62 year old male admitted through the emergency room today with complaints of chest pain. Described pain as sharp and 7/10 intensity. It was associated shortness of breath and nausea. Also radiation to the left arm and left leg patient states. Nursing called me and notified that patient states pain continues at a level of about 5/5 intensity. I came up to see the patient and we obtained a new 12-lead EKG. Patient indicates that the pain has improved in intensity since admission he has never really been pain-free. EKG shows some slight ST elevation in leads 1 otherwise looks essentially unchanged from that obtained in the emergency room. Patient describes a sharp pain that radiates to the left shoulder, left arm, and left leg. Vital signs. Temperature 90.8, pulse 79, respiration 18, blood pressure 121/54. O2 sats 99%. General appearance. Well-developed elderly male who is alert, cooperative, and in no distress. Cardiac. Rate rhythm regular. No jugular venous distention or edema. Lungs. Breath sounds clear and equal. Abdomen. Soft with positive bowel sounds. Mild diffuse pain with palpation Musculoskeletal. Patient does have pain with palpation over the sternum which he says duplicates the pain it is currently feeling. Impression/plan. Problem #1. Chest pain. EKG with slight ST elevation in lead 1 but otherwise unchanged. The fact that I can duplicates the pain with palpation over the sternum suggests musculoskeletal origin. First of 3 troponins have resulted normal less than 0.3. Nitroglycerin had not yet been started and I asked nursing to implement the nitroglycerin paste as ordered for 6 a.m. patient also has when necessary Dilaudid for pain. We'll follow for further troponins.

## 2013-12-10 NOTE — Consult Note (Signed)
CARDIOLOGY CONSULT NOTE       Patient ID: [name] MRN: [id number] DOB/AGE: 61/13/1953 61 y.o.  Admit date: 12/10/2013 Referring Physician:  Tat Primary Physician: Dorrene German, MD Primary Cardiologist:  New Reason for Consultation:  Chest pain  Principal Problem:   Chest pain Active Problems:   HTN (hypertension), malignant   Diabetes mellitus type 2, uncontrolled   Tobacco use disorder   Alcohol dependence   Leukocytosis, unspecified   HPI:   62 yo with HTN, DM admitted with atypical chest pain.  Pain has GI overtones Epigastric some nausea.  Not exertional Felt some palpitations as well No syncope Felt ill for about 48 hours before admission No known CAD  Does drink ETOH quite a bit Thinks he's had EGD and ulcer before  This am no chest pain r/o and no ECG changes Telemetry with no arrhythmia.  Has peristant nausea.  Chest pain was sharp lasting minutes no associated dyspnea   ROS All other systems reviewed and negative except as noted above  Past Medical History  Diagnosis Date  . Hypertension   . Diabetes mellitus   . Brain tumor   . CVA (cerebral vascular accident)   . Narcotic abuse   . CHF (congestive heart failure)     Family History  Problem Relation Age of Onset  . CAD Father   . Diabetes Brother   . Diabetes Sister   . Hypertension Father   . Hypertension Mother     History   Social History  . Marital Status: Legally Separated    Spouse Name: N/A    Number of Children: N/A  . Years of Education: N/A   Occupational History  . Not on file.   Social History Main Topics  . Smoking status: Current Every Day Smoker  . Smokeless tobacco: Not on file  . Alcohol Use: Yes     Comment: 6 pack a week  . Drug Use: Yes     Comment: herion, last use of herion - one month ago  . Sexual Activity: Not on file   Other Topics Concern  . Not on file   Social History Narrative  . No narrative on file    History reviewed. No pertinent past  surgical history.   Marland Kitchen amLODipine  10 mg Oral Daily  . aspirin  325 mg Oral Daily  . dextrose      . enoxaparin (LOVENOX) injection  40 mg Subcutaneous Q24H  . insulin aspart  0-5 Units Subcutaneous QHS  . insulin aspart  0-9 Units Subcutaneous TID WC  . lisinopril  20 mg Oral Daily  . nicotine  14 mg Transdermal Q24H  . nitroGLYCERIN  0.5 inch Topical Q6H  . pantoprazole  40 mg Oral Daily  . simvastatin  10 mg Oral q1800   . sodium chloride      Physical Exam: Blood pressure 121/54, pulse 89, temperature 98 F (36.7 C), temperature source Oral, resp. rate 18, height 5\' 9"  (1.753 m), weight 161 lb 6.4 oz (73.211 kg), SpO2 99.00%.   Affect appropriate Healthy:  appears stated age HEENT: poor dentition  Neck supple with no adenopathy JVP normal no bruits no thyromegaly Lungs clear with no wheezing and good diaphragmatic motion Heart:  S1/S2 no murmur, no rub, gallop or click PMI normal Abdomen: benighn, BS positve, no tenderness, no AAA no bruit.  No HSM or HJR Distal pulses intact with no bruits No edema Neuro non-focal Skin warm and dry No muscular weakness  Labs:   Lab Results  Component Value Date   WBC 12.1* 12/10/2013   HGB 13.2 12/10/2013   HCT 39.4 12/10/2013   MCV 76.2* 12/10/2013   PLT 252 12/10/2013    Recent Labs Lab 12/10/13 0530  NA 141  K 4.0  CL 104  CO2 21  BUN 16  CREATININE 0.88  CALCIUM 9.0  GLUCOSE 92   Lab Results  Component Value Date   CKTOTAL 267* 11/10/2010   CKMB 2.4 11/10/2010   TROPONINI <0.30 12/10/2013    Lab Results  Component Value Date   CHOL 197 09/07/2012   CHOL  Value: 152        ATP III CLASSIFICATION:  <200     mg/dL   Desirable  161-096  mg/dL   Borderline High  >=045    mg/dL   High        40/98/1191   CHOL  Value: 140        ATP III CLASSIFICATION:  <200     mg/dL   Desirable  478-295  mg/dL   Borderline High  >=621    mg/dL   High        03/05/6577   Lab Results  Component Value Date   HDL 57 09/07/2012    HDL 47 11/10/2010   HDL 35* 06/17/2010   Lab Results  Component Value Date   LDLCALC 116* 09/07/2012   LDLCALC  Value: 66        Total Cholesterol/HDL:CHD Risk Coronary Heart Disease Risk Table                     Men   Women  1/2 Average Risk   3.4   3.3  Average Risk       5.0   4.4  2 X Average Risk   9.6   7.1  3 X Average Risk  23.4   11.0        Use the calculated Patient Ratio above and the CHD Risk Table to determine the patient's CHD Risk.        ATP III CLASSIFICATION (LDL):  <100     mg/dL   Optimal  469-629  mg/dL   Near or Above                    Optimal  130-159  mg/dL   Borderline  528-413  mg/dL   High  >244     mg/dL   Very High 12/31/7251   LDLCALC  Value: 65        Total Cholesterol/HDL:CHD Risk Coronary Heart Disease Risk Table                     Men   Women  1/2 Average Risk   3.4   3.3  Average Risk       5.0   4.4  2 X Average Risk   9.6   7.1  3 X Average Risk  23.4   11.0        Use the calculated Patient Ratio above and the CHD Risk Table to determine the patient's CHD Risk.        ATP III CLASSIFICATION (LDL):  <100     mg/dL   Optimal  664-403  mg/dL   Near or Above                    Optimal  130-159  mg/dL  Borderline  160-189  mg/dL   High  >409     mg/dL   Very High 08/08/9146   Lab Results  Component Value Date   TRIG 121 09/07/2012   TRIG 196* 11/10/2010   TRIG 199* 06/17/2010   Lab Results  Component Value Date   CHOLHDL 3.5 09/07/2012   CHOLHDL 3.2 11/10/2010   CHOLHDL 4.0 06/17/2010   No results found for this basename: LDLDIRECT      Radiology: Dg Chest 2 View  12/10/2013   CLINICAL DATA:  Chest pain and shortness of breath.  EXAM: CHEST  2 VIEW  COMPARISON:  05/19/2013.  FINDINGS: The heart size and mediastinal contours are within normal limits. Both lungs are clear. The visualized skeletal structures are unremarkable.  IMPRESSION: No active cardiopulmonary disease.  No change from priors.   Electronically Signed   By: Davonna Belling M.D.   On: 12/10/2013  01:45    EKG:  SR rate 88 J point elevation I and AVL   ASSESSMENT AND PLAN:  Chest Pain:  Atypical R/O no need for inpatient evaluation  Can d/c home once nausea and other issues resolved I will arrange outpatient f/u and stress test ETOH:  May be related to pain PPI previous history of ulcer DM: Discussed low carb diet.  Target hemoglobin A1c is 6.5 or less.  Continue current medications. Chol:  On statin   Signed: Charlton Haws 12/10/2013, 10:56 AM

## 2013-12-10 NOTE — H&P (Signed)
[facility] History and Physical  [name] XLK:[id number] DOB: 08/10/1952 DOA: 12/10/2013  Referring physician:  EDP  PCP: Dorrene German, MD  Specialists:   Chief Complaint: Chest Pain  HPI: [name] is a 62 y.o. male with a history of DM2 and HTN and previous CVA who presents to the Ed with complaints of SSCP radiating into the Left Arm that lasted for 1 hour.   He reports the pain was sharp and rated the pain at a 7/10.  HE had SOB and nausea associated with the pain.  The pain was relieved after he arrive in the ED and was given medication.  He was found to have a negative initial workup and was referred for medical admission due to his risk factors.      Review of Systems: The patient denies anorexia, fever, chills, headaches, weight loss,, vision loss, diplopia, dizziness, decreased hearing, rhinitis, hoarseness, syncope, dyspnea on exertion, peripheral edema, balance deficits, cough, hemoptysis, abdominal pain, vomiting, diarrhea, constipation, hematemesis, melena, hematochezia, severe indigestion/heartburn, dysuria, hematuria, incontinence, muscle weakness, suspicious skin lesions, transient blindness, difficulty walking, depression, unusual weight change, abnormal bleeding, enlarged lymph nodes, angioedema, and breast masses.    Past Medical History  Diagnosis Date  . Hypertension   . Diabetes mellitus   . Brain tumor   . CVA (cerebral vascular accident)   . Narcotic abuse   . CHF (congestive heart failure)     History reviewed. No pertinent past surgical history.  Prior to Admission medications   Medication Sig Start Date End Date Taking? Authorizing Provider  amLODipine (NORVASC) 10 MG tablet Take 10 mg by mouth daily.   Yes Historical Provider, MD  aspirin EC 81 MG tablet Take 81 mg by mouth daily.   Yes Historical Provider, MD  b complex vitamins tablet Take 1 tablet by mouth daily.   Yes Historical Provider, MD  lisinopril (PRINIVIL,ZESTRIL) 20 MG  tablet Take 20 mg by mouth daily.   Yes Historical Provider, MD  metFORMIN (GLUCOPHAGE) 500 MG tablet Take 500 mg by mouth 2 (two) times daily with a meal.   Yes Historical Provider, MD  pravastatin (PRAVACHOL) 20 MG tablet Take 20 mg by mouth daily.   Yes Historical Provider, MD  traMADol (ULTRAM) 50 MG tablet Take 1 tablet (50 mg total) by mouth every 6 (six) hours as needed for pain. 10/17/13  Yes Gavin Pound. Ghim, MD    No Known Allergies  Social History:  reports that he has been smoking.  He does not have any smokeless tobacco history on file. He reports that he drinks alcohol. He reports that he uses illicit drugs.     Family History  Problem Relation Age of Onset  . CAD Father   . Diabetes Brother   . Diabetes Sister   . Hypertension Father   . Hypertension Mother        Physical Exam:  GEN:  Pleasant Thin well developed  62 y.o. African American male  examined  and in no acute distress; cooperative with exam Filed Vitals:   12/10/13 0017 12/10/13 0102 12/10/13 0230  BP: 143/63  119/66  Pulse: 106  89  Temp: 99.5 F (37.5 C)    TempSrc: Oral    Resp: 18  20  SpO2: 97% 98% 90%   Blood pressure 119/66, pulse 89, temperature 99.5 F (37.5 C), temperature source Oral, resp. rate 20, SpO2 90.00%. PSYCH: He is alert and oriented x4; does not appear anxious does not appear depressed;  affect is normal HEENT: Normocephalic and Atraumatic, Mucous membranes pink; PERRLA; EOM intact; Fundi:  Benign;  No scleral icterus, Nares: Patent, Oropharynx: Clear, Poor sparse Dentition, Neck:  FROM, no cervical lymphadenopathy nor thyromegaly or carotid bruit; no JVD; Breasts:: Not examined CHEST WALL: No tenderness CHEST: Normal respiration, clear to auscultation bilaterally HEART: Regular rate and rhythm; no murmurs rubs or gallops BACK: No kyphosis or scoliosis; no CVA tenderness ABDOMEN: Positive Bowel Sounds, soft non-tender; no masses, no organomegaly. Rectal Exam: Not  done EXTREMITIES: No cyanosis, clubbing or edema; no ulcerations. Genitalia: not examined PULSES: 2+ and symmetric SKIN: Normal hydration no rash or ulceration CNS: Cranial nerves 2-12 grossly intact no focal neurologic deficit    Labs on Admission:  Basic Metabolic Panel:  Recent Labs Lab 12/10/13 0017  NA 140  K 3.6  CL 103  CO2 22  GLUCOSE 152*  BUN 17  CREATININE 1.00  CALCIUM 9.4   Liver Function Tests: No results found for this basename: AST, ALT, ALKPHOS, BILITOT, PROT, ALBUMIN,  in the last 168 hours No results found for this basename: LIPASE, AMYLASE,  in the last 168 hours No results found for this basename: AMMONIA,  in the last 168 hours CBC:  Recent Labs Lab 12/10/13 0017  WBC 16.2*  HGB 14.3  HCT 41.9  MCV 76.2*  PLT 266   Cardiac Enzymes: No results found for this basename: CKTOTAL, CKMB, CKMBINDEX, TROPONINI,  in the last 168 hours  BNP (last 3 results)  Recent Labs  12/10/13 0017  PROBNP 51.9   CBG: No results found for this basename: GLUCAP,  in the last 168 hours  Radiological Exams on Admission: Dg Chest 2 View  12/10/2013   CLINICAL DATA:  Chest pain and shortness of breath.  EXAM: CHEST  2 VIEW  COMPARISON:  05/19/2013.  FINDINGS: The heart size and mediastinal contours are within normal limits. Both lungs are clear. The visualized skeletal structures are unremarkable.  IMPRESSION: No active cardiopulmonary disease.  No change from priors.   Electronically Signed   By: Davonna Belling M.D.   On: 12/10/2013 01:45     EKG: Independently reviewed.  Sinus Tachycardia at 107 with Ischemic Changes in Inferior Leads.     Assessment/Plan Principal Problem:   Chest pain Active Problems:   HTN (hypertension), malignant   Diabetes mellitus type 2, uncontrolled   Tobacco use disorder     1.   Chest Pain-  Telemetry Monitoring, cycle Troponins, Nitropaste, O2 and ASA Rx.  May Need Referral for Stress Testing.    2.   HTN-  Continue  amlodipine, and Lisinopril.    3.   DM2-  Hold metformin, SSI coverage ordered for PRN, and check HbA1c in AM.    4.   Tobacco Use Disorder- counseled RE: Smoking Cessation, Nicotine Patch ordered q day while in hospital.  5.    DVT Prophylaxis with Lovenox.       Code Status:      FULL CODE Family Communication:    No Family present Disposition Plan:       Observation  Time spent:  86 Minutes  Ron Parker [facility] Pager [phone]  If 7PM-7AM, please contact night-coverage [email or web address] Password [email or web address] 12/10/2013, 3:22 AM

## 2013-12-10 NOTE — Progress Notes (Signed)
Utilization Review completed.  

## 2013-12-10 NOTE — Progress Notes (Signed)
Nutrition Brief Note  Patient identified on the Malnutrition Screening Tool (MST) Report for recent weight lost without trying and eating poorly because of a decreased appetite.  Per readings below, patient has had a 4% weight loss since October 2014 -- not significant for time frame.  Wt Readings from Last 15 Encounters:  12/10/13 161 lb 6.4 oz (73.211 kg)  10/17/13 168 lb 6.4 oz (76.386 kg)  09/06/12 166 lb 10.7 oz (75.6 kg)  05/29/12 170 lb (77.111 kg)    Body mass index is 23.82 kg/(m^2). Patient meets criteria for Normal based on current BMI.   Current diet order is Carbohydrate Modified Medium Calorie.  Labs and medications reviewed.   No nutrition interventions warranted at this time. If nutrition issues arise, please consult RD.   Maureen Chatters, RD, LDN Pager #: [phone] After-Hours Pager #: [phone]

## 2013-12-10 NOTE — ED Notes (Addendum)
C/o sharp intermittent pain in center of chest with sob, dizziness, and nausea x 1 hour.

## 2013-12-10 NOTE — ED Provider Notes (Signed)
CSN: [id number]     Arrival date & time 12/10/13  0006 History   First MD Initiated Contact with Patient 12/10/13 0111     Chief Complaint  Patient presents with  . Chest Pain   (Consider location/radiation/quality/duration/timing/severity/associated sxs/prior Treatment) HPI Comments: 62 yo AA male presents with cp which started tonight at approx 2230 while drinking at a bar.  PMH c/w DM, HTN, Stroke, narcotic abuse, brain tumor, CHF, Sickle cell anemia, Collagen vascular disease, multiple myeloma, renal insufficiency, asthma.      Patient is a 62 y.o. male presenting with chest pain. The history is provided by the patient.  Chest Pain Pain location:  Substernal area Pain quality: aching   Pain radiates to the back: no   Pain severity:  Mild Onset quality:  Gradual Duration:  2 hours (Pt was drinking at a bar) Timing:  Constant Progression:  Waxing and waning Chronicity:  New Context: not breathing, no drug use, not eating and no intercourse   Relieved by:  None tried Worsened by:  Nothing tried Ineffective treatments:  None tried Associated symptoms: nausea   Risk factors: coronary artery disease, diabetes mellitus and smoking   Risk factors comment:  No previous MI per pt. H/o Stroke.   Past Medical History  Diagnosis Date  . Hypertension   . Diabetes mellitus   . Brain tumor   . CVA (cerebral vascular accident)   . Narcotic abuse   . CHF (congestive heart failure)    History reviewed. No pertinent past surgical history. No family history on file. History  Substance Use Topics  . Smoking status: Current Every Day Smoker  . Smokeless tobacco: Not on file  . Alcohol Use: Yes     Comment: 6 pack a week    Review of Systems  Constitutional: Negative.   HENT: Negative.   Eyes: Negative.   Respiratory: Negative.   Cardiovascular: Positive for chest pain.  Gastrointestinal: Positive for nausea. Negative for diarrhea, constipation, blood in stool, anal bleeding and  rectal pain.  Endocrine: Negative.   Genitourinary: Negative.   Allergic/Immunologic: Negative.   Neurological: Negative.     Allergies  Review of patient's allergies indicates no known allergies.  Home Medications   Current Outpatient Rx  Name  Route  Sig  Dispense  Refill  . amLODipine (NORVASC) 10 MG tablet   Oral   Take 10 mg by mouth daily.         Marland Kitchen aspirin EC 81 MG tablet   Oral   Take 81 mg by mouth daily.         . cefpodoxime (VANTIN) 200 MG tablet   Oral   Take 1 tablet (200 mg total) by mouth 2 (two) times daily.   14 tablet   0   . lisinopril (PRINIVIL,ZESTRIL) 20 MG tablet   Oral   Take 20 mg by mouth daily.         . metFORMIN (GLUCOPHAGE) 500 MG tablet   Oral   Take 500 mg by mouth 2 (two) times daily with a meal.         . oxyCODONE-acetaminophen (PERCOCET/ROXICET) 5-325 MG per tablet   Oral   Take 1 tablet by mouth every 4 (four) hours as needed for pain.         . pravastatin (PRAVACHOL) 20 MG tablet   Oral   Take 20 mg by mouth daily.         . promethazine (PHENERGAN) 25 MG tablet  Oral   Take 1 tablet (25 mg total) by mouth every 6 (six) hours as needed for nausea.   20 tablet   0   . traMADol (ULTRAM) 50 MG tablet   Oral   Take 1 tablet (50 mg total) by mouth every 6 (six) hours as needed for pain.   15 tablet   0    BP 143/63  Pulse 106  Temp(Src) 99.5 F (37.5 C) (Oral)  Resp 18  SpO2 98% Physical Exam  Nursing note and vitals reviewed. Constitutional: He is oriented to person, place, and time. He appears well-developed and well-nourished.  HENT:  Head: Normocephalic and atraumatic.  Eyes: Conjunctivae are normal. Pupils are equal, round, and reactive to light. Right eye exhibits no discharge. Left eye exhibits no discharge.  Neck: Normal range of motion. Neck supple.  Cardiovascular: Normal rate, regular rhythm and normal heart sounds.   Pulmonary/Chest: Effort normal and breath sounds normal. He has no  wheezes. He has no rales.  Abdominal: Soft. Bowel sounds are normal. There is no tenderness. There is no rebound and no guarding.  Musculoskeletal: Normal range of motion. He exhibits no edema and no tenderness.  Neurological: He is alert and oriented to person, place, and time. He has normal reflexes.    ED Course  Procedures (including critical care time) Labs Review Labs Reviewed  CBC - Abnormal; Notable for the following:    WBC 16.2 (*)    MCV 76.2 (*)    All other components within normal limits  BASIC METABOLIC PANEL  PRO B NATRIURETIC PEPTIDE  POCT I-STAT TROPONIN I   Imaging Review No results found.  EKG Interpretation    Date/Time:  Saturday December 10 2013 00:10:32 EST Ventricular Rate:  107 PR Interval:  156 QRS Duration: 86 QT Interval:  354 QTC Calculation: 472 R Axis:   10 Text Interpretation:  Sinus tachycardia Otherwise normal ECG Confirmed by [facility]  MD, Leray Garverick ([id number]) on 12/10/2013 1:22:32 AM           Results for orders placed during the hospital encounter of 12/10/13  CBC      Result Value Range   WBC 16.2 (*) 4.0 - 10.5 K/uL   RBC 5.50  4.22 - 5.81 MIL/uL   Hemoglobin 14.3  13.0 - 17.0 g/dL   HCT 40.9  81.1 - 91.4 %   MCV 76.2 (*) 78.0 - 100.0 fL   MCH 26.0  26.0 - 34.0 pg   MCHC 34.1  30.0 - 36.0 g/dL   RDW 78.2  95.6 - 21.3 %   Platelets 266  150 - 400 K/uL  POCT I-STAT TROPONIN I      Result Value Range   Troponin i, poc 0.00  0.00 - 0.08 ng/mL   Comment 3             MDM  No diagnosis found. 62 year old white male with past medical history of hypertension, diabetes, history of stroke, CHF, cancer, collagen vascular disease, sickle cell anemia, renal insufficiency presents emergency department with chief complaint of chest pain. Patient's symptoms began at approximately 10:30 PM this evening. ER evaluation was negative however patient has significant cardiac risk factors and would benefit from inpatient evaluation and rule out. ER  course-the patient received oxygen, full dose aspirin, GI cocktail. Hold on unfractionated heparin or low molecular weight heparin pending inpatient evaluation.  2:23 AM Consult hospitalist for admission. Vital signs stable patient in no acute distress.  The patient appears reasonably stabilized  for admission considering the current resources, flow, and capabilities available in the ED at this time, and I doubt any other [facility] requiring further screening and/or treatment in the ED prior to admission.    Darlys Gales, MD 12/10/13 [phone]

## 2013-12-10 NOTE — Progress Notes (Signed)
[facility] PROGRESS NOTE  [name] WRU:[id number] DOB: 08/10/1952 DOA: 12/10/2013 PCP: Dorrene German, MD  Brief history 62 year old male with a history of diabetes mellitus, hypertension, stroke, hyperlipidemia presents with chest discomfort that occurred around 10 PM on 12/09/2013. The patient states that he was having a couple drinks of Gin when he experienced chest discomfort. This was associated with some nausea with shortness of breath. He states that over the past several weeks he has had some chest discomfort with palpitations. In addition, the patient states that when he mows the lawn with his push mower he experiences some chest discomfort and palpitations. He states that he drinks 3 times per month but denies any other illegal drug use. The patient states that he smokes one half pack to one pack per day for the past 45 years. His father also had a myocardial infarction at age 73. Currently, the patient denies any chest discomfort but has some shortness of breath. He denies any coughing or hemoptysis. Denies any previous history of malignancy. No history of long travels or, or recent surgery. Initial troponin is negative.   Assessment/Plan: Chest discomfort -Patient has typical and atypical symptoms - Initial troponin is negative -EKG is reassuring -Consult cardiology -Continue aspirin Hypertension -Continue lisinopril and amlodipine -BP is controlled Diabetes mellitus type 2 -NovoLog sliding scale -09/06/2013 hemoglobin A1c 6.3 Hyperlipidemia -Continue statin -Check lipids Tobacco use -Tobacco cessation discussed -Nicoderm patch Leukocytosis -Afebrile and hemodynamically stable - Chest x-ray is negative for infiltrates -Urinalysis Alcohol use -Alcohol drug protocol -UDS  Family Communication:   Pt at beside Disposition Plan:   Home when medically stable     Procedures/Studies: Dg Chest 2 View  12/10/2013   CLINICAL DATA:  Chest pain and shortness  of breath.  EXAM: CHEST  2 VIEW  COMPARISON:  05/19/2013.  FINDINGS: The heart size and mediastinal contours are within normal limits. Both lungs are clear. The visualized skeletal structures are unremarkable.  IMPRESSION: No active cardiopulmonary disease.  No change from priors.   Electronically Signed   By: Davonna Belling M.D.   On: 12/10/2013 01:45         Subjective: Patient denies any chest discomfort but complains of some shortness of breath. Denies any fevers, chills, nausea, vomiting, diarrhea, abdominal pain, dizziness.  Objective: Filed Vitals:   12/10/13 0102 12/10/13 0230 12/10/13 0336 12/10/13 0422  BP:  119/66 92/73 121/54  Pulse:  89 89   Temp:   98 F (36.7 C)   TempSrc:      Resp:  20 18   Height:   5\' 9"  (1.753 m)   Weight:   73.211 kg (161 lb 6.4 oz)   SpO2: 98% 90% 99%    No intake or output data in the 24 hours ending 12/10/13 0909 Weight change:  Exam:   General:  Pt is alert, follows commands appropriately, not in acute distress  HEENT: No icterus, No thrush,/AT  Cardiovascular: RRR, S1/S2, no rubs, no gallops  Respiratory: CTA bilaterally, no wheezing, no crackles, no rhonchi  Abdomen: Soft/+BS, non tender, non distended, no guarding  Extremities: No edema, No lymphangitis, No petechiae, No rashes, no synovitis  Data Reviewed: Basic Metabolic Panel:  Recent Labs Lab 12/10/13 0017 12/10/13 0530  NA 140 141  K 3.6 4.0  CL 103 104  CO2 22 21  GLUCOSE 152* 92  BUN 17 16  CREATININE 1.00 0.88  CALCIUM 9.4 9.0   Liver Function Tests: No results found for this basename: AST,  ALT, ALKPHOS, BILITOT, PROT, ALBUMIN,  in the last 168 hours No results found for this basename: LIPASE, AMYLASE,  in the last 168 hours No results found for this basename: AMMONIA,  in the last 168 hours CBC:  Recent Labs Lab 12/10/13 0017 12/10/13 0530  WBC 16.2* 12.1*  HGB 14.3 13.2  HCT 41.9 39.4  MCV 76.2* 76.2*  PLT 266 252   Cardiac  Enzymes:  Recent Labs Lab 12/10/13 0303  TROPONINI <0.30   BNP: No components found with this basename: POCBNP,  CBG:  Recent Labs Lab 12/10/13 0803  GLUCAP 90    No results found for this or any previous visit (from the past 240 hour(s)).   Scheduled Meds: . amLODipine  10 mg Oral Daily  . aspirin  325 mg Oral Daily  . enoxaparin (LOVENOX) injection  40 mg Subcutaneous Q24H  . insulin aspart  0-5 Units Subcutaneous QHS  . insulin aspart  0-9 Units Subcutaneous TID WC  . lisinopril  20 mg Oral Daily  . nicotine  14 mg Transdermal Q24H  . nitroGLYCERIN  0.5 inch Topical Q6H  . pantoprazole  40 mg Oral Daily  . simvastatin  10 mg Oral q1800   Continuous Infusions: . sodium chloride       Stefana Lodico, DO  [facility] Pager [phone]  If 7PM-7AM, please contact night-coverage [email or web address] Password [email or web address] 12/10/2013, 9:09 AM   LOS: 0 days

## 2013-12-13 LAB — URINE CULTURE: Colony Count: >=100000

## 2013-12-15 ENCOUNTER — Emergency Department (HOSPITAL_COMMUNITY): Payer: Medicaid Other

## 2013-12-15 ENCOUNTER — Encounter (HOSPITAL_COMMUNITY): Payer: Self-pay | Admitting: Emergency Medicine

## 2013-12-15 ENCOUNTER — Emergency Department (HOSPITAL_COMMUNITY)
Admission: EM | Admit: 2013-12-15 | Discharge: 2013-12-15 | Disposition: A | Discharge status: Home / Self Care | Payer: Medicaid Other | Attending: Emergency Medicine | Admitting: Emergency Medicine

## 2013-12-15 DIAGNOSIS — R072 Precordial pain: Secondary | ICD-10-CM | POA: Insufficient documentation

## 2013-12-15 DIAGNOSIS — Z85841 Personal history of malignant neoplasm of brain: Secondary | ICD-10-CM | POA: Insufficient documentation

## 2013-12-15 DIAGNOSIS — Z8673 Personal history of transient ischemic attack (TIA), and cerebral infarction without residual deficits: Secondary | ICD-10-CM | POA: Insufficient documentation

## 2013-12-15 DIAGNOSIS — R079 Chest pain, unspecified: Secondary | ICD-10-CM

## 2013-12-15 DIAGNOSIS — E119 Type 2 diabetes mellitus without complications: Secondary | ICD-10-CM | POA: Insufficient documentation

## 2013-12-15 DIAGNOSIS — I509 Heart failure, unspecified: Secondary | ICD-10-CM | POA: Insufficient documentation

## 2013-12-15 DIAGNOSIS — I10 Essential (primary) hypertension: Secondary | ICD-10-CM | POA: Insufficient documentation

## 2013-12-15 DIAGNOSIS — Z7982 Long term (current) use of aspirin: Secondary | ICD-10-CM | POA: Insufficient documentation

## 2013-12-15 DIAGNOSIS — F17200 Nicotine dependence, unspecified, uncomplicated: Secondary | ICD-10-CM | POA: Insufficient documentation

## 2013-12-15 DIAGNOSIS — Z79899 Other long term (current) drug therapy: Secondary | ICD-10-CM | POA: Insufficient documentation

## 2013-12-15 LAB — BASIC METABOLIC PANEL
Calcium: 9.3 mg/dL (ref 8.4–10.5)
Chloride: 107 mEq/L (ref 96–112)
Creatinine, Ser: 0.93 mg/dL (ref 0.50–1.35)
GFR calc Af Amer: >90 mL/min (ref >90)
GFR calc non Af Amer: 89 mL/min — ABNORMAL LOW (ref >90)
Glucose, Bld: 109 mg/dL — ABNORMAL HIGH (ref 70–99)

## 2013-12-15 LAB — CBC
HCT: 41.2 % (ref 39.0–52.0)
MCH: 25.6 pg — ABNORMAL LOW (ref 26.0–34.0)
MCHC: 34 g/dL (ref 30.0–36.0)
MCV: 75.5 fL — ABNORMAL LOW (ref 78.0–100.0)
Platelets: 294 10*3/uL (ref 150–400)
RBC: 5.46 MIL/uL (ref 4.22–5.81)
RDW: 14.2 % (ref 11.5–15.5)
WBC: 12.3 10*3/uL — ABNORMAL HIGH (ref 4.0–10.5)

## 2013-12-15 LAB — PRO B NATRIURETIC PEPTIDE: Pro B Natriuretic peptide (BNP): 72.5 pg/mL (ref 0–125)

## 2013-12-15 MED ORDER — TRAMADOL HCL 50 MG PO TABS: 50.0000 mg | ORAL_TABLET | Freq: Four times a day (QID) | ORAL | Status: DC | PRN

## 2013-12-15 NOTE — ED Notes (Signed)
Pt reports he was just discharged two days ago, was on third floor, was admitted for chest pain and heart monitoring, and was told everything came back ok, but reports he is still having chest pain.

## 2013-12-15 NOTE — ED Notes (Signed)
Pt arrived via [facility] left sided non radiating CP that feels like burning.

## 2013-12-23 NOTE — ED Provider Notes (Signed)
CSN: [id number]     Arrival date & time 12/15/13  0550 History   First MD Initiated Contact with Patient 12/15/13 [phone]     Chief Complaint  Patient presents with  . Chest Pain   (Consider location/radiation/quality/duration/timing/severity/associated sxs/prior Treatment) HPI  [age]-year-old male with chest pain. Patient describes substernal burning. Patient was recently in the hospital and discharged 2 days ago for the same chest pain. Ruled out enzymatically and discharged. He said persistent pain since. No shortness of breath. No fevers or chills. No cough. No unusual leg pain or swelling.  No appreciable exacerbating or relieving factors.    Past Medical History  Diagnosis Date  . Hypertension   . Diabetes mellitus   . Brain tumor   . CVA (cerebral vascular accident)   . Narcotic abuse   . CHF (congestive heart failure)    History reviewed. No pertinent past surgical history. Family History  Problem Relation Age of Onset  . CAD Father   . Diabetes Brother   . Diabetes Sister   . Hypertension Father   . Hypertension Mother    History  Substance Use Topics  . Smoking status: Current Every Day Smoker  . Smokeless tobacco: Not on file  . Alcohol Use: Yes     Comment: 6 pack a week    Review of Systems  All systems reviewed and negative, other than as noted in HPI.   Allergies  Review of patient's allergies indicates no known allergies.  Home Medications   Current Outpatient Rx  Name  Route  Sig  Dispense  Refill  . acetaminophen (TYLENOL) 500 MG tablet   Oral   Take 500 mg by mouth every 6 (six) hours as needed for headache.         Marland Kitchen amLODipine (NORVASC) 10 MG tablet   Oral   Take 10 mg by mouth daily.         Marland Kitchen aspirin EC 81 MG tablet   Oral   Take 81 mg by mouth daily.         Marland Kitchen b complex vitamins tablet   Oral   Take 1 tablet by mouth daily.         Marland Kitchen lisinopril (PRINIVIL,ZESTRIL) 20 MG tablet   Oral   Take 20 mg by mouth daily.          . metFORMIN (GLUCOPHAGE) 500 MG tablet   Oral   Take 500 mg by mouth 2 (two) times daily with a meal.         . Oxycodone-Acetaminophen (PERCOCET PO)   Oral   Take 1 tablet by mouth daily as needed (for pain).         . pravastatin (PRAVACHOL) 20 MG tablet   Oral   Take 20 mg by mouth at bedtime.         . traMADol (ULTRAM) 50 MG tablet   Oral   Take 1 tablet (50 mg total) by mouth every 6 (six) hours as needed.   15 tablet   0    BP 140/65  Pulse 70  Temp(Src) 98.8 F (37.1 C) (Oral)  Resp 18  Wt 160 lb 8 oz (72.802 kg)  SpO2 100% Physical Exam  Nursing note and vitals reviewed. Constitutional: He appears well-developed and well-nourished. No distress.  HENT:  Head: Normocephalic and atraumatic.  Eyes: Conjunctivae are normal. Right eye exhibits no discharge. Left eye exhibits no discharge.  Neck: Neck supple.  Cardiovascular: Normal rate, regular rhythm and normal  heart sounds.  Exam reveals no gallop and no friction rub.   No murmur heard. Pulmonary/Chest: Effort normal and breath sounds normal. No respiratory distress.  Abdominal: Soft. He exhibits no distension. There is no tenderness.  Musculoskeletal: He exhibits no edema and no tenderness.  Lower extremities symmetric as compared to each other. No calf tenderness. Negative Homan's. No palpable cords.   Neurological: He is alert.  Skin: Skin is warm and dry.  Psychiatric: He has a normal mood and affect. His behavior is normal. Thought content normal.    ED Course  Procedures (including critical care time) Labs Review Labs Reviewed  CBC - Abnormal; Notable for the following:    WBC 12.3 (*)    MCV 75.5 (*)    MCH 25.6 (*)    All other components within normal limits  BASIC METABOLIC PANEL - Abnormal; Notable for the following:    Glucose, Bld 109 (*)    GFR calc non Af Amer 89 (*)    All other components within normal limits  PRO B NATRIURETIC PEPTIDE  POCT I-STAT TROPONIN I   Imaging  Review No results found.  EKG Interpretation    Date/Time:  Thursday December 15 2013 05:56:35 EST Ventricular Rate:  68 PR Interval:  154 QRS Duration: 80 QT Interval:  406 QTC Calculation: 431 R Axis:   28 Text Interpretation:  Normal sinus rhythm Normal ECG Confirmed by Omauri Boeve  MD, Zayonna Ayuso ([id number]) on 12/15/2013 7:06:12 AM            MDM   1. Chest pain    [age]-year-old male with chest pain. Recently admitted for the same and ruled out enzymatically. This pain is atypical in that it's been relatively constant since his last evaluation. His troponin remains normal. EKG is not significantly changed. Have a very low suspicion for ACS, pulmonary embolism, dissection or an infectious process.    Raeford Razor, MD 12/23/13 2151

## 2013-12-24 ENCOUNTER — Encounter (HOSPITAL_COMMUNITY): Payer: Self-pay | Admitting: Emergency Medicine

## 2013-12-24 ENCOUNTER — Inpatient Hospital Stay (HOSPITAL_COMMUNITY)
Admission: EM | Admit: 2013-12-24 | Discharge: 2013-12-27 | DRG: 916 | Disposition: A | Discharge status: Home / Self Care | Payer: Medicaid Other | Attending: Internal Medicine | Admitting: Internal Medicine

## 2013-12-24 DIAGNOSIS — F1020 Alcohol dependence, uncomplicated: Secondary | ICD-10-CM

## 2013-12-24 DIAGNOSIS — Z8673 Personal history of transient ischemic attack (TIA), and cerebral infarction without residual deficits: Secondary | ICD-10-CM

## 2013-12-24 DIAGNOSIS — Z833 Family history of diabetes mellitus: Secondary | ICD-10-CM

## 2013-12-24 DIAGNOSIS — R079 Chest pain, unspecified: Secondary | ICD-10-CM

## 2013-12-24 DIAGNOSIS — I10 Essential (primary) hypertension: Secondary | ICD-10-CM | POA: Diagnosis present

## 2013-12-24 DIAGNOSIS — I509 Heart failure, unspecified: Secondary | ICD-10-CM | POA: Diagnosis present

## 2013-12-24 DIAGNOSIS — R109 Unspecified abdominal pain: Secondary | ICD-10-CM

## 2013-12-24 DIAGNOSIS — G459 Transient cerebral ischemic attack, unspecified: Secondary | ICD-10-CM

## 2013-12-24 DIAGNOSIS — T783XXA Angioneurotic edema, initial encounter: Principal | ICD-10-CM

## 2013-12-24 DIAGNOSIS — G8929 Other chronic pain: Secondary | ICD-10-CM

## 2013-12-24 DIAGNOSIS — G894 Chronic pain syndrome: Secondary | ICD-10-CM | POA: Diagnosis present

## 2013-12-24 DIAGNOSIS — E119 Type 2 diabetes mellitus without complications: Secondary | ICD-10-CM | POA: Diagnosis present

## 2013-12-24 DIAGNOSIS — N390 Urinary tract infection, site not specified: Secondary | ICD-10-CM

## 2013-12-24 DIAGNOSIS — I5032 Chronic diastolic (congestive) heart failure: Secondary | ICD-10-CM | POA: Diagnosis present

## 2013-12-24 DIAGNOSIS — R1310 Dysphagia, unspecified: Secondary | ICD-10-CM | POA: Diagnosis present

## 2013-12-24 DIAGNOSIS — F17200 Nicotine dependence, unspecified, uncomplicated: Secondary | ICD-10-CM | POA: Diagnosis present

## 2013-12-24 DIAGNOSIS — Z8249 Family history of ischemic heart disease and other diseases of the circulatory system: Secondary | ICD-10-CM

## 2013-12-24 DIAGNOSIS — F1010 Alcohol abuse, uncomplicated: Secondary | ICD-10-CM | POA: Diagnosis present

## 2013-12-24 DIAGNOSIS — E1165 Type 2 diabetes mellitus with hyperglycemia: Secondary | ICD-10-CM

## 2013-12-24 DIAGNOSIS — IMO0001 Reserved for inherently not codable concepts without codable children: Secondary | ICD-10-CM | POA: Diagnosis present

## 2013-12-24 DIAGNOSIS — Z23 Encounter for immunization: Secondary | ICD-10-CM

## 2013-12-24 DIAGNOSIS — D72829 Elevated white blood cell count, unspecified: Secondary | ICD-10-CM

## 2013-12-24 DIAGNOSIS — T44905A Adverse effect of unspecified drugs primarily affecting the autonomic nervous system, initial encounter: Secondary | ICD-10-CM | POA: Diagnosis present

## 2013-12-24 DIAGNOSIS — R42 Dizziness and giddiness: Secondary | ICD-10-CM

## 2013-12-24 DIAGNOSIS — D352 Benign neoplasm of pituitary gland: Secondary | ICD-10-CM | POA: Diagnosis present

## 2013-12-24 DIAGNOSIS — T783XXS Angioneurotic edema, sequela: Secondary | ICD-10-CM

## 2013-12-24 DIAGNOSIS — E785 Hyperlipidemia, unspecified: Secondary | ICD-10-CM | POA: Diagnosis present

## 2013-12-24 HISTORY — DX: Angioneurotic edema, initial encounter: T78.3XXA

## 2013-12-24 LAB — CBC
HCT: 41.4 % (ref 39.0–52.0)
Hemoglobin: 13.8 g/dL (ref 13.0–17.0)
MCH: 25.5 pg — ABNORMAL LOW (ref 26.0–34.0)
MCHC: 33.3 g/dL (ref 30.0–36.0)
MCV: 76.4 fL — ABNORMAL LOW (ref 78.0–100.0)
Platelets: 259 10*3/uL (ref 150–400)
RDW: 14 % (ref 11.5–15.5)

## 2013-12-24 LAB — POCT I-STAT, CHEM 8
BUN: 8 mg/dL (ref 6–23)
Calcium, Ion: 1.23 mmol/L (ref 1.13–1.30)
Chloride: 105 mEq/L (ref 96–112)
Creatinine, Ser: 0.8 mg/dL (ref 0.50–1.35)
Glucose, Bld: 115 mg/dL — ABNORMAL HIGH (ref 70–99)
HCT: 48 % (ref 39.0–52.0)
TCO2: 25 mmol/L (ref 0–100)

## 2013-12-24 LAB — CREATININE, SERUM: GFR calc non Af Amer: >90 mL/min (ref >90)

## 2013-12-24 MED ORDER — AMLODIPINE BESYLATE 10 MG PO TABS
10.0000 mg | ORAL_TABLET | Freq: Every day | ORAL | Status: DC
  Administered 2013-12-24 – 2013-12-27 (×4): 10 mg via ORAL
  Filled 2013-12-24 (×4): qty 1

## 2013-12-24 MED ORDER — PANTOPRAZOLE SODIUM 40 MG IV SOLR
40.0000 mg | INTRAVENOUS | Status: DC
  Administered 2013-12-24: 40 mg via INTRAVENOUS
  Filled 2013-12-24 (×2): qty 40

## 2013-12-24 MED ORDER — PHENOL 1.4 % MT LIQD
1.0000 | OROMUCOSAL | Status: DC | PRN
  Administered 2013-12-24: 1 via OROMUCOSAL
  Filled 2013-12-24: qty 177

## 2013-12-24 MED ORDER — MORPHINE SULFATE 2 MG/ML IJ SOLN
INTRAMUSCULAR | Status: AC
  Filled 2013-12-24: qty 1

## 2013-12-24 MED ORDER — DIPHENHYDRAMINE HCL 50 MG/ML IJ SOLN: 25.0000 mg | Freq: Two times a day (BID) | INTRAMUSCULAR | Status: DC | PRN

## 2013-12-24 MED ORDER — MORPHINE SULFATE 4 MG/ML IJ SOLN
4.0000 mg | Freq: Once | INTRAMUSCULAR | Status: AC
  Administered 2013-12-24: 4 mg via INTRAVENOUS
  Filled 2013-12-24: qty 1

## 2013-12-24 MED ORDER — MORPHINE SULFATE 2 MG/ML IJ SOLN
1.0000 mg | INTRAMUSCULAR | Status: DC | PRN
  Administered 2013-12-24 – 2013-12-27 (×8): 2 mg via INTRAVENOUS
  Filled 2013-12-24 (×8): qty 1

## 2013-12-24 MED ORDER — ACETAMINOPHEN 500 MG PO TABS: 500.0000 mg | ORAL_TABLET | Freq: Four times a day (QID) | ORAL | Status: DC | PRN

## 2013-12-24 MED ORDER — OXYCODONE-ACETAMINOPHEN 5-325 MG PO TABS
1.5000 | ORAL_TABLET | Freq: Four times a day (QID) | ORAL | Status: DC | PRN
  Administered 2013-12-24 – 2013-12-25 (×2): 1.5 via ORAL
  Filled 2013-12-24 (×3): qty 2

## 2013-12-24 MED ORDER — METHYLPREDNISOLONE SODIUM SUCC 125 MG IJ SOLR
80.0000 mg | Freq: Two times a day (BID) | INTRAMUSCULAR | Status: DC
  Administered 2013-12-25: 80 mg via INTRAVENOUS
  Filled 2013-12-24: qty 2
  Filled 2013-12-24 (×2): qty 1.28

## 2013-12-24 MED ORDER — OXYCODONE-ACETAMINOPHEN 7.5-500 MG PO TABS: 1.0000 | ORAL_TABLET | Freq: Four times a day (QID) | ORAL | Status: DC | PRN

## 2013-12-24 MED ORDER — ONDANSETRON HCL 4 MG PO TABS: 4.0000 mg | ORAL_TABLET | Freq: Four times a day (QID) | ORAL | Status: DC | PRN

## 2013-12-24 MED ORDER — KCL IN DEXTROSE-NACL 10-5-0.45 MEQ/L-%-% IV SOLN
INTRAVENOUS | Status: DC
  Administered 2013-12-24: 50 mL via INTRAVENOUS
  Filled 2013-12-24 (×2): qty 1000

## 2013-12-24 MED ORDER — HEPARIN SODIUM (PORCINE) 5000 UNIT/ML IJ SOLN
5000.0000 [IU] | Freq: Three times a day (TID) | INTRAMUSCULAR | Status: DC
Start: 2013-12-24 — End: 2013-12-27
  Administered 2013-12-24 – 2013-12-27 (×8): 5000 [IU] via SUBCUTANEOUS
  Filled 2013-12-24 (×11): qty 1

## 2013-12-24 MED ORDER — METHYLPREDNISOLONE SODIUM SUCC 125 MG IJ SOLR
125.0000 mg | Freq: Once | INTRAMUSCULAR | Status: AC
  Administered 2013-12-24: 125 mg via INTRAVENOUS
  Filled 2013-12-24: qty 2

## 2013-12-24 MED ORDER — METHYLPREDNISOLONE SODIUM SUCC 125 MG IJ SOLR: 80.0000 mg | Freq: Two times a day (BID) | INTRAMUSCULAR | Status: DC

## 2013-12-24 MED ORDER — EPINEPHRINE 0.3 MG/0.3ML IJ SOAJ
0.3000 mg | Freq: Once | INTRAMUSCULAR | Status: AC
  Administered 2013-12-24: 0.3 mg via INTRAMUSCULAR
  Filled 2013-12-24: qty 0.3

## 2013-12-24 MED ORDER — HYDRALAZINE HCL 20 MG/ML IJ SOLN
10.0000 mg | Freq: Four times a day (QID) | INTRAMUSCULAR | Status: DC | PRN
  Administered 2013-12-24: 10 mg via INTRAVENOUS
  Filled 2013-12-24: qty 1

## 2013-12-24 MED ORDER — MORPHINE SULFATE 2 MG/ML IJ SOLN
2.0000 mg | Freq: Once | INTRAMUSCULAR | Status: AC
  Administered 2013-12-24: 2 mg via INTRAVENOUS

## 2013-12-24 MED ORDER — METOPROLOL TARTRATE 1 MG/ML IV SOLN
INTRAVENOUS | Status: AC
  Filled 2013-12-24: qty 5

## 2013-12-24 MED ORDER — METOPROLOL TARTRATE 1 MG/ML IV SOLN
5.0000 mg | Freq: Once | INTRAVENOUS | Status: AC
  Administered 2013-12-24: 5 mg via INTRAVENOUS

## 2013-12-24 MED ORDER — PNEUMOCOCCAL VAC POLYVALENT 25 MCG/0.5ML IJ INJ
0.5000 mL | INJECTION | INTRAMUSCULAR | Status: DC
  Filled 2013-12-24: qty 0.5

## 2013-12-24 MED ORDER — AMLODIPINE BESYLATE 10 MG PO TABS: 10.0000 mg | ORAL_TABLET | Freq: Every day | ORAL | Status: DC

## 2013-12-24 MED ORDER — ONDANSETRON HCL 4 MG/2ML IJ SOLN: 4.0000 mg | Freq: Four times a day (QID) | INTRAMUSCULAR | Status: DC | PRN

## 2013-12-24 MED ORDER — NICOTINE 14 MG/24HR TD PT24
14.0000 mg | MEDICATED_PATCH | Freq: Every day | TRANSDERMAL | Status: DC
  Administered 2013-12-24 – 2013-12-27 (×4): 14 mg via TRANSDERMAL
  Filled 2013-12-24 (×5): qty 1

## 2013-12-24 MED ORDER — DIPHENHYDRAMINE HCL 50 MG/ML IJ SOLN
25.0000 mg | Freq: Once | INTRAMUSCULAR | Status: AC
  Administered 2013-12-24: 25 mg via INTRAVENOUS
  Filled 2013-12-24: qty 1

## 2013-12-24 MED ORDER — INSULIN ASPART 100 UNIT/ML ~~LOC~~ SOLN
0.0000 [IU] | Freq: Three times a day (TID) | SUBCUTANEOUS | Status: DC
  Administered 2013-12-25: 2 [IU] via SUBCUTANEOUS
  Administered 2013-12-25: 7 [IU] via SUBCUTANEOUS
  Administered 2013-12-26: 1 [IU] via SUBCUTANEOUS
  Administered 2013-12-26 (×2): 2 [IU] via SUBCUTANEOUS

## 2013-12-24 MED ORDER — ASPIRIN EC 81 MG PO TBEC
81.0000 mg | DELAYED_RELEASE_TABLET | Freq: Every day | ORAL | Status: DC
  Administered 2013-12-24 – 2013-12-27 (×4): 81 mg via ORAL
  Filled 2013-12-24 (×4): qty 1

## 2013-12-24 MED ORDER — ASPIRIN EC 81 MG PO TBEC: 81.0000 mg | DELAYED_RELEASE_TABLET | Freq: Every day | ORAL | Status: DC

## 2013-12-24 NOTE — H&P (Signed)
[facility] History and Physical  FOREST REDWINE BJY:[id number] DOB: 08/10/1952 DOA: 12/24/2013  Referring physician: Dr. Bebe Shaggy PCP: Dorrene German, MD   Chief Complaint: Angioedema  HPI: [name] is a 62 y.o. male with a past medical history significant for hypertension, diabetes, tobacco abuse, chronic diastolic congestive heart failure (preserved ejection fraction and no wall motion abnormalities on his last 2-D echo in June of 2011), and care and history of CVA; came to the hospital complaining of acute swelling of his face, lips and tongue every this morning after he woke up. Patient reports that the first time he experienced anything like this and has come to the hospital for further evaluation and treatment as he was experiencing some difficulty swallowing and managing his own saliva. In the ED patient was examined and physical exam suggested angioedema. Patient responded well to the use of epinephrine, Benadryl and Solu Medrol; swelling has improved some, but the patient was a still having some difficulty with swallowing and his uvula was mildly swollen; [facility] has been asked to admit the patient for observation due to concerns of potential decline and worsening swelling compromising airways. Of note patient denies chest pain, shortness of breath, palpitations, fever, chills, nausea/vomiting, abdominal pain, dysuria or any other acute complaints. Patient was actively receiving treatment for blood pressure with lisinopril.  Review of Systems:  Negative except as otherwise mentioned on history of present illness.  Past Medical History  Diagnosis Date  . Hypertension   . Diabetes mellitus   . Brain tumor   . CVA (cerebral vascular accident)   . Narcotic abuse   . CHF (congestive heart failure)    History reviewed. No pertinent past surgical history. Social History:  reports that he has been smoking.  He does not have any smokeless tobacco history on file. He  reports that he drinks alcohol. He reports that he uses illicit drugs.  No Known Allergies  Family History  Problem Relation Age of Onset  . CAD Father   . Diabetes Brother   . Diabetes Sister   . Hypertension Father   . Hypertension Mother      Prior to Admission medications   Medication Sig Start Date End Date Taking? Authorizing Provider  acetaminophen (TYLENOL) 500 MG tablet Take 500 mg by mouth every 6 (six) hours as needed for headache.   Yes Historical Provider, MD  amLODipine (NORVASC) 10 MG tablet Take 10 mg by mouth daily.   Yes Historical Provider, MD  aspirin EC 81 MG tablet Take 81 mg by mouth daily.   Yes Historical Provider, MD  b complex vitamins tablet Take 1 tablet by mouth daily.   Yes Historical Provider, MD  metFORMIN (GLUCOPHAGE) 500 MG tablet Take 500 mg by mouth 2 (two) times daily with a meal.   Yes Historical Provider, MD  Oxycodone-Acetaminophen (PERCOCET PO) Take 1 tablet by mouth daily as needed (for pain).   Yes Historical Provider, MD  pravastatin (PRAVACHOL) 20 MG tablet Take 20 mg by mouth at bedtime.   Yes Historical Provider, MD  traMADol (ULTRAM) 50 MG tablet Take 50 mg by mouth every 6 (six) hours as needed for moderate pain. 12/15/13  Yes Raeford Razor, MD   Physical Exam: Filed Vitals:   12/24/13 1730  BP: 169/62  Pulse: 83  Temp:   Resp: 14    BP 169/62  Pulse 83  Temp(Src) 99.1 F (37.3 C) (Oral)  Resp 14  Wt 75.014 kg (165 lb 6 oz)  SpO2 96%  General:  Appears calm, currently able to protect airways, and not complaining of shortness of breath. Patient describes difficulty swallowing and managing his on saliva. Eyes: PERRL, muddy sclera; normal lids, irises & conjunctiva ENT: grossly normal hearing, swelling of his upper lips, mild swelling appreciated on his uvula and tongue. Poor dentition Neck: no LAD, masses or thyromegaly, no JVD. Cardiovascular: RRR, positive systolic ejection murmur;  No rubs or gallops. No LE  edema. Respiratory: CTA bilaterally, no w/r/r. Normal respiratory effort. Abdomen: soft, nt, nd, positive bowel sounds Skin: no rash or induration seen on limited exam Musculoskeletal: grossly normal tone BUE/BLE Psychiatric: grossly normal mood and affect, speech fluent and appropriate Neurologic: grossly non-focal.          Labs on Admission:  Basic Metabolic Panel:  Recent Labs Lab 12/24/13 1625  NA 142  K 3.8  CL 105  GLUCOSE 115*  BUN 8  CREATININE 0.80   CBC:  Recent Labs Lab 12/24/13 1625  HGB 16.3  HCT 48.0   Cardiac Enzymes: No results found for this basename: CKTOTAL, CKMB, CKMBINDEX, TROPONINI,  in the last 168 hours  BNP (last 3 results)  Recent Labs  12/10/13 0017 12/15/13 0605  PROBNP 51.9 72.5   CBG: No results found for this basename: GLUCAP,  in the last 168 hours   Assessment/Plan 1-Angioedema of lips: appears to be secondary to lisinopril -patient with difficulty swallowing and on physical exam mild swelling of uvula and significant swelling of his lips. -will admit to stepdown for close observation -NPO -solumedrol and benadryl -lisinopril discontinued -started on protonix  2-prior history of TIA (transient ischemic attack)/CVA: no acute neurologic deficit. Will continue ASA for secondary prevention  3-HTN (hypertension): will continue amlodipine. If needed will add bidil.  4-Diabetes mellitus type 2, uncontrolled: will start SSI, holding oral hypoglycemic agents    5-Tobacco use disorder: will start nicotine patch. Cessation counseling provided.  6-Chronic pain: will continue percocet and PRN morphine for severe pain.  DVT: heparin.  Code Status: Full Family Communication: no family at bedside Disposition Plan: observation, LOs < 2 midnights, stepdown  Time spent: 45 minutes  Dolora Ridgely [facility] Pager [phone]

## 2013-12-24 NOTE — ED Notes (Signed)
Admitting MD at bedside. Reporting patient will be placed in stepdown bed.

## 2013-12-24 NOTE — ED Provider Notes (Signed)
CSN: [id number]     Arrival date & time 12/24/13  1421 History   First MD Initiated Contact with Patient 12/24/13 1507     Chief Complaint  Patient presents with  . Angioedema    The history is provided by the patient.  patient presents with facial swelling He reports left sided facial swelling since this morning It is worsening.  Nothing improves his symptoms He reports his lips, face and tongue feel swollen.  He reports he is having some difficulty swallowing No fever/cp/sob.  No rash.  No diarrhea.   He has never had this before.  No known h/o allergic rxns.      Past Medical History  Diagnosis Date  . Hypertension   . Diabetes mellitus   . Brain tumor   . CVA (cerebral vascular accident)   . Narcotic abuse   . CHF (congestive heart failure)    History reviewed. No pertinent past surgical history. Family History  Problem Relation Age of Onset  . CAD Father   . Diabetes Brother   . Diabetes Sister   . Hypertension Father   . Hypertension Mother    History  Substance Use Topics  . Smoking status: Current Every Day Smoker  . Smokeless tobacco: Not on file  . Alcohol Use: Yes     Comment: 6 pack a week    Review of Systems  Constitutional: Negative for fever.  Respiratory: Negative for shortness of breath.   Gastrointestinal: Negative for vomiting.  Skin: Negative for rash.  All other systems reviewed and are negative.    Allergies  Review of patient's allergies indicates no known allergies.  Home Medications   Current Outpatient Rx  Name  Route  Sig  Dispense  Refill  . acetaminophen (TYLENOL) 500 MG tablet   Oral   Take 500 mg by mouth every 6 (six) hours as needed for headache.         Marland Kitchen amLODipine (NORVASC) 10 MG tablet   Oral   Take 10 mg by mouth daily.         Marland Kitchen aspirin EC 81 MG tablet   Oral   Take 81 mg by mouth daily.         Marland Kitchen b complex vitamins tablet   Oral   Take 1 tablet by mouth daily.         . metFORMIN (GLUCOPHAGE)  500 MG tablet   Oral   Take 500 mg by mouth 2 (two) times daily with a meal.         . Oxycodone-Acetaminophen (PERCOCET PO)   Oral   Take 1 tablet by mouth daily as needed (for pain).         . pravastatin (PRAVACHOL) 20 MG tablet   Oral   Take 20 mg by mouth at bedtime.         . traMADol (ULTRAM) 50 MG tablet   Oral   Take 1 tablet (50 mg total) by mouth every 6 (six) hours as needed.   15 tablet   0    BP 163/75  Pulse 72  Temp(Src) 99.1 F (37.3 C) (Oral)  Resp 18  Wt 165 lb 6 oz (75.014 kg)  SpO2 94% Physical Exam CONSTITUTIONAL: Well developed/well nourished HEAD: Normocephalic/atraumatic EYES: EOMI/PERRL ENMT: Mucous membranes moist. Left upper lip is edematous and tender.  His uvula appears edematous.  His tongue does not appear significantly edematous.  No erythema to face.  No stridor is noted.  Pt is  handling secretions.  Normal phonation NECK: supple no meningeal signs SPINE:entire spine nontender CV: S1/S2 noted, no murmurs/rubs/gallops noted LUNGS: Lungs are clear to auscultation bilaterally, no apparent distress ABDOMEN: soft, nontender, no rebound or guarding NEURO: Pt is awake/alert, moves all extremitiesx4 EXTREMITIES: pulses normal, full ROM SKIN: warm, color normal PSYCH: no abnormalities of mood noted  ED Course  Procedures (including critical care time) 3:32 PM I suspect pt is having ACE inhibitor induced angioedema.  He reports difficulty swallowing and his uvula does appear enlarged.  Will treat with solumedrol/epinephrine/benadryl.  I advised him to stop lisinopril.  Will follow closely 5:35 PM Pt without any improvement Will admit for observation D/w dr Gwenlyn Perking he will see patient and will place bed requests   Labs Review Labs Reviewed - No data to display Imaging Review No results found.  EKG Interpretation   None       MDM  No diagnosis found. Nursing notes including past medical history and social history reviewed and  considered in documentation Labs/vital reviewed and considered     Joya Gaskins, MD 12/24/13 1735

## 2013-12-24 NOTE — ED Notes (Signed)
[name] [phone]

## 2013-12-24 NOTE — ED Notes (Signed)
Pt having left mouth swelling.  No new meds or foods.  No resp or swallowing difficulties.

## 2013-12-24 NOTE — ED Notes (Signed)
Per pt sts he woke up this am and left side of his face was swollen. Lip and face very swollen. sts hard to swallow.

## 2013-12-25 LAB — CBC
HCT: 40.2 % (ref 39.0–52.0)
Hemoglobin: 13.5 g/dL (ref 13.0–17.0)
MCHC: 33.6 g/dL (ref 30.0–36.0)
MCV: 76.7 fL — ABNORMAL LOW (ref 78.0–100.0)
RDW: 14.2 % (ref 11.5–15.5)

## 2013-12-25 LAB — GLUCOSE, CAPILLARY
Glucose-Capillary: 100 mg/dL — ABNORMAL HIGH (ref 70–99)
Glucose-Capillary: 105 mg/dL — ABNORMAL HIGH (ref 70–99)
Glucose-Capillary: 125 mg/dL — ABNORMAL HIGH (ref 70–99)
Glucose-Capillary: 314 mg/dL — ABNORMAL HIGH (ref 70–99)

## 2013-12-25 LAB — HEMOGLOBIN A1C: Mean Plasma Glucose: 134 mg/dL — ABNORMAL HIGH (ref <117)

## 2013-12-25 LAB — BASIC METABOLIC PANEL
BUN: 9 mg/dL (ref 6–23)
CO2: 25 mEq/L (ref 19–32)
Chloride: 104 mEq/L (ref 96–112)
Creatinine, Ser: 0.8 mg/dL (ref 0.50–1.35)
Potassium: 3.7 mEq/L (ref 3.5–5.1)

## 2013-12-25 LAB — MRSA PCR SCREENING: MRSA by PCR: NEGATIVE

## 2013-12-25 MED ORDER — OXYCODONE HCL 5 MG PO TABS
5.0000 mg | ORAL_TABLET | Freq: Four times a day (QID) | ORAL | Status: DC | PRN
  Administered 2013-12-25 – 2013-12-27 (×9): 10 mg via ORAL
  Filled 2013-12-25 (×9): qty 2

## 2013-12-25 MED ORDER — DIPHENHYDRAMINE HCL 50 MG/ML IJ SOLN
25.0000 mg | Freq: Two times a day (BID) | INTRAMUSCULAR | Status: DC
  Administered 2013-12-25 – 2013-12-27 (×5): 25 mg via INTRAVENOUS
  Filled 2013-12-25 (×2): qty 0.5
  Filled 2013-12-25 (×2): qty 1
  Filled 2013-12-25 (×2): qty 0.5
  Filled 2013-12-25: qty 1

## 2013-12-25 MED ORDER — HYDRALAZINE HCL 25 MG PO TABS
25.0000 mg | ORAL_TABLET | Freq: Three times a day (TID) | ORAL | Status: DC
  Administered 2013-12-25 – 2013-12-26 (×3): 25 mg via ORAL
  Filled 2013-12-25 (×6): qty 1

## 2013-12-25 MED ORDER — PREDNISONE 20 MG PO TABS
40.0000 mg | ORAL_TABLET | Freq: Two times a day (BID) | ORAL | Status: AC
  Administered 2013-12-25 – 2013-12-26 (×3): 40 mg via ORAL
  Filled 2013-12-25 (×4): qty 2

## 2013-12-25 MED ORDER — ISOSORBIDE DINITRATE 10 MG PO TABS
10.0000 mg | ORAL_TABLET | Freq: Three times a day (TID) | ORAL | Status: DC
  Administered 2013-12-25 – 2013-12-27 (×7): 10 mg via ORAL
  Filled 2013-12-25 (×10): qty 1

## 2013-12-25 MED ORDER — FAMOTIDINE 40 MG PO TABS
40.0000 mg | ORAL_TABLET | Freq: Two times a day (BID) | ORAL | Status: DC
  Administered 2013-12-25 – 2013-12-27 (×5): 40 mg via ORAL
  Filled 2013-12-25 (×7): qty 1

## 2013-12-25 NOTE — Progress Notes (Signed)
[facility] Progress Note [name]   [name]:[id number] DOB: 08/10/1952 DOA: 12/24/2013 PCP: Dorrene German, MD  Admit HPI / Brief Narrative: 62 y.o. male with a past medical history significant for hypertension, diabetes, tobacco abuse, chronic diastolic congestive heart failure (preserved ejection fraction and no wall motion abnormalities on his last 2-D echo in June of 2011), and a history of CVA; came to the hospital complaining of acute swelling of his face, lips and tongue noted after he woke up. He was experiencing difficulty swallowing and managing his own saliva.  In the ED exam suggested angioedema. Patient responded well to the use of epinephrine, Benadryl and Solu Medrol; swelling improved some, but the patient was still having some difficulty with swallowing and his uvula was mildly swollen.  Patient was actively receiving treatment for blood pressure with lisinopril  HPI/Subjective: Pt feels much better this morning, but states that he still feels modest tightness in the back of his throat.  He denies cp, or sob.  No abdom pain, f/c, or ha.  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 rate 18, height 5\' 9"  (1.753 m), weight 70.5 kg (155 lb 6.8 oz), SpO2 94.00%.  Intake/Output Summary (Last 24 hours) at 12/25/13 0930 Last data filed at 12/25/13 0600  Gross per 24 hour  Intake    680 ml  Output    500 ml  Net    180 ml   Exam: General: No acute respiratory distress Lungs: Clear to auscultation bilaterally without wheezes or crackles - no stridor  Cardiovascular: Regular rate and rhythm without murmur gallop or rub normal S1 and S2 Abdomen: Nontender, nondistended, soft, bowel sounds positive, no rebound, no ascites, no appreciable mass Extremities: No significant cyanosis, clubbing, or edema bilateral lower extremities  Data Reviewed: Basic Metabolic Panel:  Recent Labs Lab 12/24/13 1625 12/24/13 2200 12/25/13 0510  NA 142  --  140  K 3.8  --  3.7  CL 105  --  104  CO2  --   --  25  GLUCOSE 115*  --  99  BUN 8  --  9  CREATININE 0.80 0.73 0.80  CALCIUM  --   --  9.0   Liver Function Tests: No results found for this basename: AST, ALT, ALKPHOS, BILITOT, PROT, ALBUMIN,  in the last 168 hours  CBC:  Recent Labs Lab 12/24/13 1625 12/24/13 2200 12/25/13 0510  WBC  --  13.0* 11.7*  HGB 16.3 13.8 13.5  HCT 48.0 41.4 40.2  MCV  --  76.4* 76.7*  PLT  --  259 254   CBG:  Recent Labs Lab 12/24/13 1906 12/24/13 2152 12/25/13 0806  GLUCAP 100* 105* 125*    Recent Results (from the past 240 hour(s))  MRSA PCR SCREENING     Status: None   Collection Time    12/24/13  7:10 PM      Result Value Range Status   MRSA by PCR NEGATIVE  NEGATIVE Final   Comment:            The GeneXpert MRSA Assay (FDA     approved for NASAL specimens     only), is one component of a     comprehensive MRSA colonization     surveillance program. It is not     intended to diagnose MRSA     infection nor to guide or     monitor treatment for     MRSA  infections.     Studies:  Recent x-ray studies have been reviewed in detail by the Attending Physician  Scheduled Meds:  Scheduled Meds: . amLODipine  10 mg Oral Daily  . aspirin EC  81 mg Oral Daily  . heparin  5,000 Units Subcutaneous Q8H  . insulin aspart  0-9 Units Subcutaneous TID WC  . methylPREDNISolone (SOLU-MEDROL) injection  80 mg Intravenous Q12H  . nicotine  14 mg Transdermal Daily  . pantoprazole (PROTONIX) IV  40 mg Intravenous Q24H  . pneumococcal 23 valent vaccine  0.5 mL Intramuscular Tomorrow-1000    Time spent on care of this patient: 35 mins   [facility] T  [facility] Office  [phone] Pager - Text Page per Loretha Stapler as per below:  On-Call/Text Page:      [email or web address]      password [email or web address]  If 7PM-7AM, please contact night-coverage [email or web address] Password [email or web address] 12/25/2013, 9:30 AM   LOS: 1 day

## 2013-12-25 NOTE — Progress Notes (Signed)
Utilization Review completed.  

## 2013-12-25 NOTE — Progress Notes (Signed)
Notified Md about pts SBP 202/67 Hr 89.  Already given PRN med and sch bp med.  Unable to get SBP less than 180.  New orders received.  Pt also c/o soar throat.  Will continue to monitor. Karena Addison T

## 2013-12-25 NOTE — Progress Notes (Signed)
Info sheet provided re pneumonia vaccine .Pt will inform nurse whether or not he agrees to be vaccinated.

## 2013-12-26 ENCOUNTER — Inpatient Hospital Stay (HOSPITAL_COMMUNITY): Payer: Medicaid Other

## 2013-12-26 LAB — GLUCOSE, CAPILLARY
Glucose-Capillary: 125 mg/dL — ABNORMAL HIGH (ref 70–99)
Glucose-Capillary: 188 mg/dL — ABNORMAL HIGH (ref 70–99)
Glucose-Capillary: 170 mg/dL — ABNORMAL HIGH (ref 70–99)
Glucose-Capillary: 233 mg/dL — ABNORMAL HIGH (ref 70–99)

## 2013-12-26 MED ORDER — INSULIN GLARGINE 100 UNIT/ML ~~LOC~~ SOLN
10.0000 [IU] | Freq: Every day | SUBCUTANEOUS | Status: DC
  Administered 2013-12-26: 10 [IU] via SUBCUTANEOUS
  Filled 2013-12-26 (×2): qty 0.1

## 2013-12-26 MED ORDER — HYDRALAZINE HCL 50 MG PO TABS
50.0000 mg | ORAL_TABLET | Freq: Three times a day (TID) | ORAL | Status: DC
  Administered 2013-12-26 – 2013-12-27 (×4): 50 mg via ORAL
  Filled 2013-12-26 (×6): qty 1

## 2013-12-26 MED ORDER — GADOBENATE DIMEGLUMINE 529 MG/ML IV SOLN
10.0000 mL | Freq: Once | INTRAVENOUS | Status: AC
  Administered 2013-12-26: 10 mL via INTRAVENOUS

## 2013-12-26 NOTE — Progress Notes (Signed)
[facility] Progress Note [place] TEAM 1 - Stepdown/ICU TEAM   [name] YNW:[id number] DOB: 08/10/1952 DOA: 12/24/2013 PCP: Dorrene German, MD  Admit HPI / Brief Narrative: 62 y.o. male with a past medical history significant for hypertension, diabetes, tobacco abuse, chronic diastolic congestive heart failure (preserved ejection fraction and no wall motion abnormalities on his last 2-D echo in June of 2011), pituitary tumor, and a history of CVA; came to the hospital complaining of acute swelling of his face, lips and tongue noted after he woke up. He was experiencing difficulty swallowing and managing his own saliva.  In the ED exam suggested angioedema. Patient responded well to the use of epinephrine, Benadryl and Solu Medrol; swelling improved some, but the patient was still having some difficulty with swallowing and his uvula was mildly swollen.  Patient was actively receiving treatment for blood pressure with lisinopril.  HPI/Subjective: The patient reports the swelling in his face and throat has improved greatly.  He denies any difficulty swallowing or breathing.  He does however report a complaint of headache and visual difficulty.  He did not mention this previously when questioned but now states that it has been present for approximately one week.  He states he feels that his peripheral vision has diminished over this time and also reports migratory blurriness of each eye.  His headache is reportedly intermittent and diffuse.  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  This does raise concern that the patient could be malingering in regard to his headache complaints - will not adjust narcotic pain medication as the patient does not appear to be in severe pain at the time of my exam  Tobacco abuse   EtOH abuse   Chronic diastolic CHF Well compensated at present  Code Status: FULL Family Communication: no family present at time of exam Disposition Plan: Transfer to a medical bed - adjust blood pressure medications - obtain MRI of brain to evaluate pituitary tumor - potential discharge 12/30  Consultants: none  Procedures: none  Antibiotics: none  DVT prophylaxis: SQ heparin  Objective: Blood pressure 144/50, pulse 72, temperature 98.7 F (37.1 C), temperature source Oral, resp. rate 13, height 5\' 9"  (1.753 m), weight 72.5 kg (159 lb 13.3 oz), SpO2 100.00%.  Intake/Output Summary (Last 24 hours) at 12/26/13 1227 Last data filed at 12/26/13 1000  Gross per 24 hour  Intake   1260 ml  Output   1500 ml  Net   -240 ml   Exam: General:  No acute respiratory distress Lungs: Clear to auscultation bilaterally without wheezes or crackles - no stridor  Cardiovascular: Regular rate and rhythm without murmur gallop or rub normal S1 and S2 Abdomen: Nontender, nondistended, soft, bowel sounds positive, no rebound, no ascites, no appreciable mass Extremities: No significant cyanosis, clubbing, or edema  bilateral lower extremities Neuro:  Alert and oriented - cranial nerves II through XII intact bilaterally - no obvious visual field cut elicited on exam  Data Reviewed: Basic Metabolic Panel:  Recent Labs Lab 12/24/13 1625 12/24/13 2200 12/25/13 0510  NA 142  --  140  K 3.8  --  3.7  CL 105  --  104  CO2  --   --  25  GLUCOSE 115*  --  99  BUN 8  --  9  CREATININE 0.80 0.73 0.80  CALCIUM  --   --  9.0   CBC:  Recent Labs Lab 12/24/13 1625 12/24/13 2200 12/25/13 0510  WBC  --  13.0* 11.7*  HGB 16.3 13.8 13.5  HCT 48.0 41.4 40.2  MCV  --  76.4* 76.7*  PLT  --  259 254   CBG:  Recent Labs Lab 12/25/13 0806 12/25/13 1203 12/25/13 1652 12/25/13 2141 12/26/13 0831  GLUCAP 125* 314* 167* 236* 125*  125*    Recent Results (from the past 240 hour(s))  MRSA PCR SCREENING     Status: None   Collection Time    12/24/13  7:10 PM      Result Value Range Status   MRSA by PCR NEGATIVE  NEGATIVE Final   Comment:            The GeneXpert MRSA Assay (FDA     approved for NASAL specimens     only), is one component of a     comprehensive MRSA colonization     surveillance program. It is not     intended to diagnose MRSA     infection nor to guide or     monitor treatment for     MRSA infections.     Studies:  Recent x-ray studies have been reviewed in detail by the Attending Physician  Scheduled Meds:  Scheduled Meds: . amLODipine  10 mg Oral Daily  . aspirin EC  81 mg Oral Daily  . diphenhydrAMINE  25 mg Intravenous Q12H  . famotidine  40 mg Oral BID  . heparin  5,000 Units Subcutaneous Q8H  . hydrALAZINE  25 mg Oral Q8H  . insulin aspart  0-9 Units Subcutaneous TID WC  . isosorbide dinitrate  10 mg Oral TID  . nicotine  14 mg Transdermal Daily  . pneumococcal 23 valent vaccine  0.5 mL Intramuscular Tomorrow-1000  . predniSONE  40 mg Oral BID WC    Time spent on care of this patient: 25 mins   [facility] T  [facility] Office   [phone] Pager - Text Page per Loretha Stapler as per below:  On-Call/Text Page:      [email or web address]      password [email or web address]  If 7PM-7AM, please contact night-coverage [email or web address] Password [email or web address] 12/26/2013, 12:27 PM   LOS: 2 days

## 2013-12-26 NOTE — Progress Notes (Signed)
Inpatient Diabetes Program Recommendations  AACE/ADA: New Consensus Statement on Inpatient Glycemic Control (2013)  Target Ranges:  Prepandial:   less than 140 mg/dL      Peak postprandial:   less than 180 mg/dL (1-2 hours)      Critically ill patients:  140 - 180 mg/dL   Hyperglycemia while on steroid therapy  Inpatient Diabetes Program Recommendations Insulin - Meal Coverage: Please add 3 units meal coverage while on Prednisone any dose greater than 10 mg/dL  Thank you, Lenor Coffin, RN, CNS, Diabetes Coordinator [phone])

## 2013-12-26 NOTE — Progress Notes (Signed)
INITIAL NUTRITION ASSESSMENT  DOCUMENTATION CODES Per approved criteria  -Not Applicable   INTERVENTION: 1.  General healthful diet; encourage intake of foods and beverages as able.  RD to follow and assess for nutritional adequacy.  Will consider supplements based on adequacy of intake.  NUTRITION DIAGNOSIS: Unintended wt loss related to variable appetite as evidenced by pt report.   Monitor:  1.  Food/Beverage; pt meeting >/=90% estimated needs with tolerance. 2.  Wt/wt change; monitor trends  Reason for Assessment: MST  62 y.o. male  Admitting Dx: Angioedema of lips  ASSESSMENT: Pt admitted with angioedema of lips.   RD met with pt due to reported wt loss on admission.  RD has difficulty in determining pt's nutrition needs, if any, during visit due to pt's variable answers to questions asked.  Pt states his appetite is great.  He reports he has been eating per his usual.  He also reports that his appetite drops off and he doesn't eat for a few days "every now and then".  He reports his wt is stable, but then states "I need to figure out what's going on because a person doesn't just drop wt like that." Per chart review, pt's usual wt appears to be ~165 lbs.  He may have lost 3% of his usual wt in the past one month which is not clinically significant. Pt states he eats baked and broiled foods at home and is familiar with Mrs. Dash.   He is eating lunch at time of visit.   Nutrition Focused Physical Exam: Subcutaneous Fat:  Orbital Region: WNL Upper Arm Region: WNL Thoracic and Lumbar Region: WNL  Muscle:  Temple Region: WNL Clavicle Bone Region: WNL Clavicle and Acromion Bone Region: WNL Scapular Bone Region: WNL Dorsal Hand: WNL Patellar Region: not assessed Anterior Thigh Region: not assessed Posterior Calf Region: not assessed  Edema: angioedema, otherwise none  Height: Ht Readings from Last 1 Encounters:  12/24/13 5\' 9"  (1.753 m)    Weight: Wt Readings from  Last 1 Encounters:  12/26/13 159 lb 13.3 oz (72.5 kg)    Ideal Body Weight: 160 lbs  % Ideal Body Weight: 99%  Wt Readings from Last 10 Encounters:  12/26/13 159 lb 13.3 oz (72.5 kg)  12/15/13 160 lb 8 oz (72.802 kg)  12/10/13 161 lb 6.4 oz (73.211 kg)  10/17/13 168 lb 6.4 oz (76.386 kg)  09/06/12 166 lb 10.7 oz (75.6 kg)  05/29/12 170 lb (77.111 kg)    Usual Body Weight: 165 lbs  % Usual Body Weight: 97%  BMI:  Body mass index is 23.59 kg/(m^2).  Estimated Nutritional Needs: Kcal: 1810-2030 Protein: 75-90g Fluid: ~2.0 L/day  Skin: intacgt  Diet Order: Carb Control  EDUCATION NEEDS: -Education needs addressed   Intake/Output Summary (Last 24 hours) at 12/26/13 1245 Last data filed at 12/26/13 1000  Gross per 24 hour  Intake   1260 ml  Output   1100 ml  Net    160 ml    Last BM: 12/26  Labs:   Recent Labs Lab 12/24/13 1625 12/24/13 2200 12/25/13 0510  NA 142  --  140  K 3.8  --  3.7  CL 105  --  104  CO2  --   --  25  BUN 8  --  9  CREATININE 0.80 0.73 0.80  CALCIUM  --   --  9.0  GLUCOSE 115*  --  99    CBG (last 3)   Recent Labs  12/25/13  2141 12/26/13 0831 12/26/13 1155  GLUCAP 236* 125*  125* 188*    Scheduled Meds: . amLODipine  10 mg Oral Daily  . aspirin EC  81 mg Oral Daily  . diphenhydrAMINE  25 mg Intravenous Q12H  . famotidine  40 mg Oral BID  . heparin  5,000 Units Subcutaneous Q8H  . hydrALAZINE  25 mg Oral Q8H  . insulin aspart  0-9 Units Subcutaneous TID WC  . isosorbide dinitrate  10 mg Oral TID  . nicotine  14 mg Transdermal Daily  . pneumococcal 23 valent vaccine  0.5 mL Intramuscular Tomorrow-1000  . predniSONE  40 mg Oral BID WC    Continuous Infusions:   Past Medical History  Diagnosis Date  . Hypertension   . Diabetes mellitus   . Brain tumor   . CVA (cerebral vascular accident)   . Narcotic abuse   . CHF (congestive heart failure)     History reviewed. No pertinent past surgical  history.  Loyce Dys, MS RD LDN Clinical Inpatient Dietitian Pager: [phone] Weekend/After hours pager: [phone]

## 2013-12-26 NOTE — Care Management Note (Signed)
    Page 1 of 1   12/26/2013     11:13:50 AM   CARE MANAGEMENT NOTE 12/26/2013  Patient:  [name], [name]   Account Number:  [id number]  Date Initiated:  12/26/2013  Documentation initiated by:  Junius Creamer  Subjective/Objective Assessment:   adm w angioedema of lips     Action/Plan:   lives alone, pcp dr Concepcion Elk   Anticipated DC Date:     Anticipated DC Plan:  HOME/SELF CARE      DC Planning Services  CM consult      Choice offered to / List presented to:             Status of service:   Medicare Important Message given?   (If response is "NO", the following Medicare IM given date fields will be blank) Date Medicare IM given:   Date Additional Medicare IM given:    Discharge Disposition:  HOME/SELF CARE  Per UR Regulation:  Reviewed for med. necessity/level of care/duration of stay  If discussed at Long Length of Stay Meetings, dates discussed:    Comments:  12/29 1111a debbie Pamalee Marcoe rn,bsn spoke w pt. he had concerns about copays w medicaid. pt has $1.00 to $3.00 per med copay. explained to pt no fund to cover copays. he could talk to drugstore as some drugstores may waive copays and some may not. he will talk to his drugstore.

## 2013-12-27 DIAGNOSIS — F1020 Alcohol dependence, uncomplicated: Secondary | ICD-10-CM

## 2013-12-27 LAB — GLUCOSE, CAPILLARY
Glucose-Capillary: 111 mg/dL — ABNORMAL HIGH (ref 70–99)
Glucose-Capillary: 100 mg/dL — ABNORMAL HIGH (ref 70–99)

## 2013-12-27 MED ORDER — NICOTINE 14 MG/24HR TD PT24: 14.0000 mg | MEDICATED_PATCH | Freq: Every day | TRANSDERMAL | Status: DC

## 2013-12-27 MED ORDER — HYDRALAZINE HCL 50 MG PO TABS: 50.0000 mg | ORAL_TABLET | Freq: Three times a day (TID) | ORAL | Status: DC

## 2013-12-27 MED ORDER — NICOTINE 7 MG/24HR TD PT24: 7.0000 mg | MEDICATED_PATCH | Freq: Every day | TRANSDERMAL | Status: DC

## 2013-12-27 MED ORDER — ISOSORBIDE DINITRATE 10 MG PO TABS: 10.0000 mg | ORAL_TABLET | Freq: Three times a day (TID) | ORAL | Status: DC

## 2013-12-27 NOTE — Progress Notes (Signed)
Patient having some bulging- pink and soft to touch on left hard palate and its tender. Prn pain medicine helped. Remained same size during the shift. No drainage.

## 2013-12-27 NOTE — Discharge Summary (Signed)
Physician Discharge Summary  [name] JYN:[id number] DOB: 08/10/1952 DOA: 12/24/2013  PCP: Dorrene German, MD  Admit date: 12/24/2013 Discharge date: 12/27/2013  Time spent: >45 minutes  Recommendations for Outpatient Follow-up:  1. F/u BP and adjust meds as needed  Discharge Diagnoses:  Principal Problem:   Angioedema of lips Active Problems:   TIA (transient ischemic attack)   HTN (hypertension), malignant   Diabetes mellitus type 2, uncontrolled   Tobacco use disorder   Chronic pain??   Discharge Condition: stable  Diet recommendation: heart healthy  Filed Weights   12/24/13 1451 12/24/13 2006 12/26/13 0400  Weight: 75.014 kg (165 lb 6 oz) 70.5 kg (155 lb 6.8 oz) 72.5 kg (159 lb 13.3 oz)    History of present illness:  62 y.o. male with a past medical history significant for hypertension, diabetes, tobacco abuse, chronic diastolic congestive heart failure (preserved ejection fraction and no wall motion abnormalities on his last 2-D echo in June of 2011), pituitary tumor, and a history of CVA; came to the hospital complaining of acute swelling of his face, lips and tongue noted after he woke up. He was experiencing difficulty swallowing and managing his own saliva.  In the ED exam suggested angioedema. Patient responded well to the use of epinephrine, Benadryl and Solu Medrol; swelling improved some, but the patient was still having some difficulty with swallowing and his uvula was mildly swollen. Patient was actively receiving treatment for blood pressure with lisinopril.    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 Wynetta Emery follows him for this issue as outpt  -MRI performed reveals no change in chronic mass (see report below).   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 - chronic pain syndrome  This does raise concern that the patient could be malingering in regard to his headache complaints on 12/29  HTN - severely uncontrolled  - Lisinopril has been replaced by Hydralazine and Imdur- BP stable but may need further titration of meds as oupt  DM2  Temporarily poorly controlled while on steroids for above reaction- reasonable after steroids discontinued- cont Metformin  HLD  Cont home medical tx   Hx of CVA/TIA   Tobacco abuse  - Smoking cessation advised and nicotine patches prescribed   EtOH abuse   Chronic diastolic CHF  Well compensated at present   Discharge Exam: Filed Vitals:   12/27/13 1423  BP: 154/68  Pulse: 88  Temp: 97.7 F (36.5 C)  Resp: 20    General: AAO X 3, no distress Cardiovascular: RRR, no murmurs Respiratory: CTA b/l   Discharge Instructions  Discharge Orders   Future Orders Complete By Expires   Diet - low sodium heart healthy  As directed    Increase activity slowly  As directed  Medication List    STOP taking these medications       lisinopril 20 MG tablet  Commonly known as:  PRINIVIL,ZESTRIL      TAKE these medications       acetaminophen 500 MG tablet  Commonly known as:  TYLENOL  Take 500 mg by mouth every 6 (six) hours as needed for headache.     amLODipine 10 MG tablet  Commonly known as:  NORVASC  Take 10 mg by mouth daily.     aspirin EC 81 MG tablet  Take 81 mg by mouth daily.     b  complex vitamins tablet  Take 1 tablet by mouth daily.     hydrALAZINE 50 MG tablet  Commonly known as:  APRESOLINE  Take 1 tablet (50 mg total) by mouth every 8 (eight) hours.     isosorbide dinitrate 10 MG tablet  Commonly known as:  ISORDIL  Take 1 tablet (10 mg total) by mouth 3 (three) times daily.     metFORMIN 500 MG tablet  Commonly known as:  GLUCOPHAGE  Take 500 mg by mouth 2 (two) times daily with a meal.     nicotine 14 mg/24hr patch  Commonly known as:  NICODERM CQ - dosed in mg/24 hours  Place 1 patch (14 mg total) onto the skin daily.     nicotine 7 mg/24hr patch  Commonly known as:  NICODERM CQ  Place 1 patch (7 mg total) onto the skin daily.     PERCOCET PO  Take 1 tablet by mouth daily as needed (for pain).     pravastatin 20 MG tablet  Commonly known as:  PRAVACHOL  Take 20 mg by mouth at bedtime.     traMADol 50 MG tablet  Commonly known as:  ULTRAM  Take 50 mg by mouth every 6 (six) hours as needed for moderate pain.       No Known Allergies     Follow-up Information   Follow up with AVBUERE,EDWIN A, MD. Schedule an appointment as soon as possible for a visit in 1 week.   Specialty:  Internal Medicine   Contact information:   [address] [place] [address] [phone]        The results of significant diagnostics from this hospitalization (including imaging, microbiology, ancillary and laboratory) are listed below for reference.    Significant Diagnostic Studies: Dg Chest 2 View  12/15/2013   CLINICAL DATA:  Chest pain, shortness of breath  EXAM: CHEST  2 VIEW  COMPARISON:  Prior radiograph from 12/10/2013  FINDINGS: The cardiac and mediastinal silhouettes are stable in size and contour, and remain within normal limits.  The lungs are normally inflated. No airspace consolidation, pleural effusion, or pulmonary edema is identified. There is no pneumothorax.  No acute osseous abnormality identified.  IMPRESSION: No active  cardiopulmonary disease.   Electronically Signed   By: Rise Mu M.D.   On: 12/15/2013 06:56   Dg Chest 2 View  12/10/2013   CLINICAL DATA:  Chest pain and shortness of breath.  EXAM: CHEST  2 VIEW  COMPARISON:  05/19/2013.  FINDINGS: The heart size and mediastinal contours are within normal limits. Both lungs are clear. The visualized skeletal structures are unremarkable.  IMPRESSION: No active cardiopulmonary disease.  No change from priors.   Electronically Signed   By: Davonna Belling M.D.   On: 12/10/2013 01:45   Mr Laqueta Jean HQ Contrast  12/26/2013   CLINICAL DATA:  Followup pituitary  adenoma.  EXAM: MRI HEAD WITHOUT AND WITH CONTRAST  TECHNIQUE: Multiplanar, multiecho pulse sequences of the brain and surrounding structures were obtained without and with intravenous contrast.  CONTRAST:  10mL MULTIHANCE GADOBENATE DIMEGLUMINE 529 MG/ML IV SOLN  COMPARISON:  09/23/2011  FINDINGS: There is a 16 mm diameter T2 hyperintense, nonenhancing abnormality in the region of the left hard palate, which is contiguous with the left canine. No significant surrounding inflammatory changes to suggest acute infection.  Stable, benign enhancing abnormality in the diploic space of the right parietal bone.  No acute intracranial abnormalities such as hemorrhage, hydrocephalus, acute infarct, or mass lesion. There are patchy bilateral cerebral white matter T2 and FLAIR hyperintense signal abnormalities, compatible with chronic small vessel ischemia. There has been a small (12 mm) cortical and subcortical infarct in the left frontal lobe the the, present in 2013.  Stable enhancing mass centered in the pituitary gland, measuring 10 x 6 x 10 mm. There is surrounding rim of relative hypo enhancement. There is upward bulging of the pituitary, but decreased from initial imaging in 2011. No evidence of extra sellar tumor extension, or extension into the cavernous sinuses. The optic chiasm is normal in size and positioning.   IMPRESSION: 1. 10 x 6 x 10 mm pituitary adenoma which is stable compared to recent priors, and mildly decreased from initial imaging in 2011. No cavernous sinus or suprasellar involvement. 2. 16 mm periapical cyst associated with a left upper canine. Recommend dental referral.   Electronically Signed   By: Tiburcio Pea M.D.   On: 12/26/2013 21:39    Microbiology: Recent Results (from the past 240 hour(s))  MRSA PCR SCREENING     Status: None   Collection Time    12/24/13  7:10 PM      Result Value Range Status   MRSA by PCR NEGATIVE  NEGATIVE Final   Comment:            The GeneXpert MRSA Assay (FDA     approved for NASAL specimens     only), is one component of a     comprehensive MRSA colonization     surveillance program. It is not     intended to diagnose MRSA     infection nor to guide or     monitor treatment for     MRSA infections.     Labs: Basic Metabolic Panel:  Recent Labs Lab 12/24/13 1625 12/24/13 2200 12/25/13 0510  NA 142  --  140  K 3.8  --  3.7  CL 105  --  104  CO2  --   --  25  GLUCOSE 115*  --  99  BUN 8  --  9  CREATININE 0.80 0.73 0.80  CALCIUM  --   --  9.0   Liver Function Tests: No results found for this basename: AST, ALT, ALKPHOS, BILITOT, PROT, ALBUMIN,  in the last 168 hours No results found for this basename: LIPASE, AMYLASE,  in the last 168 hours No results found for this basename: AMMONIA,  in the last 168 hours CBC:  Recent Labs Lab 12/24/13 1625 12/24/13 2200 12/25/13 0510  WBC  --  13.0* 11.7*  HGB 16.3 13.8 13.5  HCT 48.0 41.4 40.2  MCV  --  76.4* 76.7*  PLT  --  259 254   Cardiac Enzymes: No results found for this basename: CKTOTAL, CKMB, CKMBINDEX, TROPONINI,  in the last 168 hours BNP: BNP (last 3 results)  Recent Labs  12/10/13 0017 12/15/13 0605  PROBNP 51.9 72.5   CBG:  Recent Labs Lab 12/26/13 1155 12/26/13 1632 12/26/13 2204 12/27/13 0646 12/27/13 1111  GLUCAP 188* 170* 233* 111* 100*        Signed:  Sherl Yzaguirre  [facility] 12/27/2013, 3:16 PM

## 2013-12-27 NOTE — Progress Notes (Signed)
Discharge instructions given. Patient verbalized understanding and all questions were answered.  ?

## 2014-01-31 ENCOUNTER — Encounter: Payer: Self-pay | Admitting: *Deleted

## 2014-05-15 ENCOUNTER — Emergency Department (HOSPITAL_COMMUNITY)
Admission: EM | Admit: 2014-05-15 | Discharge: 2014-05-15 | Disposition: A | Discharge status: Home / Self Care | Payer: Medicaid Other | Attending: Emergency Medicine | Admitting: Emergency Medicine

## 2014-05-15 ENCOUNTER — Encounter (HOSPITAL_COMMUNITY): Payer: Self-pay | Admitting: Emergency Medicine

## 2014-05-15 ENCOUNTER — Emergency Department (HOSPITAL_COMMUNITY): Payer: Medicaid Other

## 2014-05-15 DIAGNOSIS — Z79899 Other long term (current) drug therapy: Secondary | ICD-10-CM | POA: Insufficient documentation

## 2014-05-15 DIAGNOSIS — Z7982 Long term (current) use of aspirin: Secondary | ICD-10-CM | POA: Insufficient documentation

## 2014-05-15 DIAGNOSIS — E119 Type 2 diabetes mellitus without complications: Secondary | ICD-10-CM | POA: Insufficient documentation

## 2014-05-15 DIAGNOSIS — Z8673 Personal history of transient ischemic attack (TIA), and cerebral infarction without residual deficits: Secondary | ICD-10-CM | POA: Insufficient documentation

## 2014-05-15 DIAGNOSIS — N419 Inflammatory disease of prostate, unspecified: Secondary | ICD-10-CM

## 2014-05-15 DIAGNOSIS — I10 Essential (primary) hypertension: Secondary | ICD-10-CM | POA: Insufficient documentation

## 2014-05-15 DIAGNOSIS — Z86011 Personal history of benign neoplasm of the brain: Secondary | ICD-10-CM | POA: Insufficient documentation

## 2014-05-15 DIAGNOSIS — I509 Heart failure, unspecified: Secondary | ICD-10-CM | POA: Insufficient documentation

## 2014-05-15 DIAGNOSIS — F17200 Nicotine dependence, unspecified, uncomplicated: Secondary | ICD-10-CM | POA: Insufficient documentation

## 2014-05-15 LAB — CBC WITH DIFFERENTIAL/PLATELET
BASOS ABS: 0.1 10*3/uL (ref 0.0–0.1)
Basophils Relative: 1 % (ref 0–1)
EOS ABS: 0.4 10*3/uL (ref 0.0–0.7)
Eosinophils Relative: 4 % (ref 0–5)
HEMATOCRIT: 44.8 % (ref 39.0–52.0)
Hemoglobin: 14.4 g/dL (ref 13.0–17.0)
Lymphocytes Relative: 26 % (ref 12–46)
Lymphs Abs: 2.3 10*3/uL (ref 0.7–4.0)
MCH: 24 pg — ABNORMAL LOW (ref 26.0–34.0)
MCHC: 32.1 g/dL (ref 30.0–36.0)
MCV: 74.7 fL — AB (ref 78.0–100.0)
MONO ABS: 1.1 10*3/uL — AB (ref 0.1–1.0)
Monocytes Relative: 12 % (ref 3–12)
NEUTROS ABS: 5.1 10*3/uL (ref 1.7–7.7)
Neutrophils Relative %: 57 % (ref 43–77)
Platelets: 228 10*3/uL (ref 150–400)
RBC: 6 MIL/uL — ABNORMAL HIGH (ref 4.22–5.81)
RDW: 14.3 % (ref 11.5–15.5)
WBC: 9 10*3/uL (ref 4.0–10.5)

## 2014-05-15 LAB — COMPREHENSIVE METABOLIC PANEL
ALBUMIN: 3.9 g/dL (ref 3.5–5.2)
ALT: 35 U/L (ref 0–53)
AST: 42 U/L — ABNORMAL HIGH (ref 0–37)
Alkaline Phosphatase: 67 U/L (ref 39–117)
BUN: 14 mg/dL (ref 6–23)
CO2: 24 mEq/L (ref 19–32)
Calcium: 9.6 mg/dL (ref 8.4–10.5)
Chloride: 101 mEq/L (ref 96–112)
Creatinine, Ser: 0.88 mg/dL (ref 0.50–1.35)
GFR calc Af Amer: >90 mL/min (ref >90)
GFR calc non Af Amer: >90 mL/min (ref >90)
Glucose, Bld: 131 mg/dL — ABNORMAL HIGH (ref 70–99)
Potassium: 4.4 mEq/L (ref 3.7–5.3)
Sodium: 138 mEq/L (ref 137–147)
TOTAL PROTEIN: 8.1 g/dL (ref 6.0–8.3)
Total Bilirubin: 0.5 mg/dL (ref 0.3–1.2)

## 2014-05-15 LAB — URINALYSIS, ROUTINE W REFLEX MICROSCOPIC
Bilirubin Urine: NEGATIVE
GLUCOSE, UA: NEGATIVE mg/dL
Ketones, ur: NEGATIVE mg/dL
NITRITE: NEGATIVE
PROTEIN: 30 mg/dL — AB
Specific Gravity, Urine: 1.013 (ref 1.005–1.030)
Urobilinogen, UA: 1 mg/dL (ref 0.0–1.0)
pH: 6 (ref 5.0–8.0)

## 2014-05-15 LAB — RAPID URINE DRUG SCREEN, HOSP PERFORMED
AMPHETAMINES: NOT DETECTED
Barbiturates: NOT DETECTED
Benzodiazepines: NOT DETECTED
Cocaine: NOT DETECTED
Opiates: POSITIVE — AB
Tetrahydrocannabinol: NOT DETECTED

## 2014-05-15 LAB — URINE MICROSCOPIC-ADD ON

## 2014-05-15 LAB — LIPASE, BLOOD: LIPASE: 22 U/L (ref 11–59)

## 2014-05-15 MED ORDER — ONDANSETRON 4 MG PO TBDP
8.0000 mg | ORAL_TABLET | Freq: Once | ORAL | Status: AC
  Administered 2014-05-15: 8 mg via ORAL
  Filled 2014-05-15: qty 2

## 2014-05-15 MED ORDER — CIPROFLOXACIN HCL 500 MG PO TABS: 500.0000 mg | ORAL_TABLET | Freq: Two times a day (BID) | ORAL | Status: DC

## 2014-05-15 MED ORDER — IOHEXOL 300 MG/ML  SOLN
100.0000 mL | Freq: Once | INTRAMUSCULAR | Status: AC | PRN
  Administered 2014-05-15: 100 mL via INTRAVENOUS

## 2014-05-15 MED ORDER — ONDANSETRON HCL 4 MG/2ML IJ SOLN: 4.0000 mg | Freq: Once | INTRAMUSCULAR | Status: DC

## 2014-05-15 MED ORDER — HYDROCODONE-ACETAMINOPHEN 5-325 MG PO TABS: 2.0000 | ORAL_TABLET | ORAL | Status: DC | PRN

## 2014-05-15 MED ORDER — IOHEXOL 300 MG/ML  SOLN
20.0000 mL | INTRAMUSCULAR | Status: DC
  Administered 2014-05-15: 20 mL via ORAL

## 2014-05-15 NOTE — ED Notes (Signed)
Called CT to inform them that pt was done drinking contrast.

## 2014-05-15 NOTE — ED Notes (Signed)
Informed pt that EDP was waiting on a urine sample. Gave pt a urinal.

## 2014-05-15 NOTE — ED Notes (Signed)
Patient transported to CT 

## 2014-05-15 NOTE — ED Notes (Signed)
Paged IV Team for an IV start. IV start attempted by RN x 3 without success.

## 2014-05-15 NOTE — Discharge Instructions (Signed)
Prostatitis The prostate gland is about the size and shape of a walnut. It is located just below your bladder. It produces one of the components of semen, which is made up of sperm and the fluids that help nourish and transport it out from the testicles. Prostatitis is inflammation of the prostate gland.  There are four types of prostatitis:  Acute bacterial prostatitis This is the least common type of prostatitis. It starts quickly and usually is associated with a bladder infection, high fever, and shaking chills. It can occur at any age.  Chronic bacterial prostatitis This is a persistent bacterial infection in the prostate. It usually develops from repeated acute bacterial prostatitis or acute bacterial prostatitis that was not properly treated. It can occur in men of any age but is most common in middle-aged men whose prostate has begun to enlarge. The symptoms are not as severe as those in acute bacterial prostatitis. Discomfort in the part of your body that is in front of your rectum and below your scrotum (perineum), lower abdomen, or in the head of your penis (glans) may represent your primary discomfort.  Chronic prostatitis (nonbacterial) This is the most common type of prostatitis. It is inflammation of the prostate gland that is not caused by a bacterial infection. The cause is unknown and may be associated with a viral infection or autoimmune disorder.  Prostatodynia (pelvic floor disorder) This is associated with increased muscular tone in the pelvis surrounding the prostate. CAUSES The causes of bacterial prostatitis are bacterial infection. The causes of the other types of prostatitis are unknown.  SYMPTOMS  Symptoms can vary depending upon the type of prostatitis that exists. There can also be overlap in symptoms. Possible symptoms for each type of prostatitis are listed below. Acute Bacterial Prostatitis  Painful urination.  Fever or chills.  Muscle or joint pains.  Low  back pain.  Low abdominal pain.  Inability to empty bladder completely. Chronic Bacterial Prostatitis, Chronic Nonbacterial Prostatitis, and Prostatodynia  Sudden urge to urinate.  Frequent urination.  Difficulty starting urine stream.  Weak urine stream.  Discharge from the urethra.  Dribbling after urination.  Rectal pain.  Pain in the testicles, penis, or tip of the penis.  Pain in the perineum.  Problems with sexual function.  Painful ejaculation.  Bloody semen. DIAGNOSIS  In order to diagnose prostatitis, your health care provider will ask about your symptoms. One or more urine samples will be taken and tested (urinalysis). If the urinalysis result is negative for bacteria, your health care provider may use a finger to feel your prostate (digital rectal exam). This exam helps your health care provider determine if your prostate is swollen and tender. It will also produce a specimen of semen that can be analyzed. TREATMENT  Treatment for prostatitis depends on the cause. If a bacterial infection is the cause, it can be treated with antibiotic medicine. In cases of chronic bacterial prostatitis, the use of antibiotics for up to 1 month or 6 weeks may be necessary. Your health care provider may instruct you to take sitz baths to help relieve pain. A sitz bath is a bath of hot water in which your hips and buttocks are under water. This relaxes the pelvic floor muscles and often helps to relieve the pressure on your prostate. HOME CARE INSTRUCTIONS   Take all medicines as directed by your health care provider.  Take sitz baths as directed by your health care provider. SEEK MEDICAL CARE IF:   Your symptoms  get worse, not better. °· You have a fever. °SEEK IMMEDIATE MEDICAL CARE IF:  °· You have chills. °· You feel nauseous or vomit. °· You feel lightheaded or faint. °· You are unable to urinate. °· You have blood or blood clots in your urine. °Document Released: 12/12/2000  Document Revised: 10/05/2013 Document Reviewed: 07/04/2013 °ExitCare® Patient Information ©2014 [employer]. ° °Urinary Tract Infection °Urinary tract infections (UTIs) can develop anywhere along your urinary tract. Your urinary tract is your body's drainage system for removing wastes and extra water. Your urinary tract includes two kidneys, two ureters, a bladder, and a urethra. Your kidneys are a pair of bean-shaped organs. Each kidney is about the size of your fist. They are located below your ribs, one on each side of your spine. °CAUSES °Infections are caused by microbes, which are microscopic organisms, including fungi, viruses, and bacteria. These organisms are so small that they can only be seen through a microscope. Bacteria are the microbes that most commonly cause UTIs. °SYMPTOMS  °Symptoms of UTIs may vary by age and gender of the patient and by the location of the infection. Symptoms in young women typically include a frequent and intense urge to urinate and a painful, burning feeling in the bladder or urethra during urination. Older women and men are more likely to be tired, shaky, and weak and have muscle aches and abdominal pain. A fever may mean the infection is in your kidneys. Other symptoms of a kidney infection include pain in your back or sides below the ribs, nausea, and vomiting. °DIAGNOSIS °To diagnose a UTI, your caregiver will ask you about your symptoms. Your caregiver also will ask to provide a urine sample. The urine sample will be tested for bacteria and white blood cells. White blood cells are made by your body to help fight infection. °TREATMENT  °Typically, UTIs can be treated with medication. Because most UTIs are caused by a bacterial infection, they usually can be treated with the use of antibiotics. The choice of antibiotic and length of treatment depend on your symptoms and the type of bacteria causing your infection. °HOME CARE INSTRUCTIONS °· If you were prescribed  antibiotics, take them exactly as your caregiver instructs you. Finish the medication even if you feel better after you have only taken some of the medication. °· Drink enough water and fluids to keep your urine clear or pale yellow. °· Avoid caffeine, tea, and carbonated beverages. They tend to irritate your bladder. °· Empty your bladder often. Avoid holding urine for long periods of time. °· Empty your bladder before and after sexual intercourse. °· After a bowel movement, women should cleanse from front to back. Use each tissue only once. °SEEK MEDICAL CARE IF:  °· You have back pain. °· You develop a fever. °· Your symptoms do not begin to resolve within 3 days. °SEEK IMMEDIATE MEDICAL CARE IF:  °· You have severe back pain or lower abdominal pain. °· You develop chills. °· You have nausea or vomiting. °· You have continued burning or discomfort with urination. °MAKE SURE YOU:  °· Understand these instructions. °· Will watch your condition. °· Will get help right away if you are not doing well or get worse. °Document Released: 09/24/2005 Document Revised: 06/15/2012 Document Reviewed: 01/23/2012 °ExitCare® Patient Information ©2014 [employer]. ° °

## 2014-05-15 NOTE — ED Notes (Signed)
Pt c/o lower abd pain with nausea and vomiting.  Denies diarrhea.  Last BM this am.  Also c/o urinary frequency

## 2014-05-15 NOTE — ED Provider Notes (Signed)
CSN: [id number]     Arrival date & time 05/15/14  1456 History   None    Chief Complaint  Patient presents with  . Abdominal Pain     (Consider location/radiation/quality/duration/timing/severity/associated sxs/prior Treatment) Patient is a 63 y.o. male presenting with abdominal pain. The history is provided by the patient and medical records. No language interpreter was used.  Abdominal Pain Pain location:  Suprapubic, LLQ and RLQ Pain quality: aching   Pain radiates to:  Does not radiate Pain severity:  Severe Onset quality:  Gradual Duration:  2 days Timing:  Constant Progression:  Worsening Chronicity:  New Relieved by:  Nothing Worsened by:  Movement and palpation Ineffective treatments:  Not moving and position changes Associated symptoms: no anorexia, no chest pain, no constipation, no diarrhea, no fever, no hematemesis, no hematochezia and no shortness of breath   Associated symptoms comment:  Urinary frequency and dysuria Risk factors: alcohol abuse and being elderly     Past Medical History  Diagnosis Date  . Hypertension   . Diabetes mellitus   . Brain tumor   . CVA (cerebral vascular accident)   . Narcotic abuse   . CHF (congestive heart failure)    History reviewed. No pertinent past surgical history. Family History  Problem Relation Age of Onset  . CAD Father   . Diabetes Brother   . Diabetes Sister   . Hypertension Father   . Hypertension Mother    History  Substance Use Topics  . Smoking status: Current Every Day Smoker  . Smokeless tobacco: Not on file  . Alcohol Use: Yes     Comment: 6 pack a week    Review of Systems  Constitutional: Negative for fever.  Respiratory: Negative for shortness of breath.   Cardiovascular: Negative for chest pain.  Gastrointestinal: Positive for abdominal pain. Negative for diarrhea, constipation, hematochezia, anorexia and hematemesis.  All other systems reviewed and are negative.     Allergies  Review  of patient's allergies indicates no known allergies.  Home Medications   Prior to Admission medications   Medication Sig Start Date End Date Taking? Authorizing Provider  acetaminophen (TYLENOL) 500 MG tablet Take 500 mg by mouth every 6 (six) hours as needed for headache.   Yes Historical Provider, MD  amLODipine (NORVASC) 10 MG tablet Take 10 mg by mouth daily.   Yes Historical Provider, MD  aspirin EC 81 MG tablet Take 81 mg by mouth daily.   Yes Historical Provider, MD  b complex vitamins tablet Take 1 tablet by mouth daily.   Yes Historical Provider, MD  hydrALAZINE (APRESOLINE) 50 MG tablet Take 1 tablet (50 mg total) by mouth every 8 (eight) hours. 12/27/13  Yes Debbe Odea, MD  isosorbide dinitrate (ISORDIL) 10 MG tablet Take 1 tablet (10 mg total) by mouth 3 (three) times daily. 12/27/13  Yes Debbe Odea, MD  metFORMIN (GLUCOPHAGE) 500 MG tablet Take 500 mg by mouth 2 (two) times daily with a meal.   Yes Historical Provider, MD  nicotine (NICODERM CQ - DOSED IN MG/24 HOURS) 14 mg/24hr patch Place 1 patch (14 mg total) onto the skin daily. 12/27/13  Yes Debbe Odea, MD  oxyCODONE-acetaminophen (PERCOCET/ROXICET) 5-325 MG per tablet Take 1 tablet by mouth every 4 (four) hours as needed for severe pain.   Yes Historical Provider, MD  pravastatin (PRAVACHOL) 20 MG tablet Take 20 mg by mouth at bedtime.   Yes Historical Provider, MD  traMADol (ULTRAM) 50 MG tablet Take 50 mg  by mouth every 6 (six) hours as needed for moderate pain. 12/15/13  Yes Virgel Manifold, MD   BP 141/75  Pulse 70  Temp(Src) 98 F (36.7 C) (Oral)  Resp 18  Ht 5\' 9"  (1.753 m)  Wt 160 lb (72.576 kg)  BMI 23.62 kg/m2  SpO2 97% Physical Exam  Nursing note and vitals reviewed. Constitutional: He is oriented to person, place, and time. He appears well-developed and well-nourished.  HENT:  Head: Normocephalic and atraumatic.  Right Ear: External ear normal.  Left Ear: External ear normal.  Nose: Nose normal.   Mouth/Throat: Oropharynx is clear and moist.  Eyes: Conjunctivae and EOM are normal. Pupils are equal, round, and reactive to light.  Neck: Normal range of motion. Neck supple.  Cardiovascular: Normal rate, regular rhythm and normal heart sounds.   Pulmonary/Chest: Effort normal and breath sounds normal.  Abdominal: Soft. He exhibits no distension and no mass. There is tenderness (diffuse TTP that is most severe over SP region). There is no rebound and no guarding.  Genitourinary: Prostate is enlarged and tender.  Musculoskeletal: Normal range of motion. He exhibits no edema and no tenderness.  Neurological: He is alert and oriented to person, place, and time.  Skin: Skin is warm and dry.  Psychiatric: He has a normal mood and affect.    ED Course  Procedures (including critical care time) Labs Review Labs Reviewed  CBC WITH DIFFERENTIAL - Abnormal; Notable for the following:    RBC 6.00 (*)    MCV 74.7 (*)    MCH 24.0 (*)    Monocytes Absolute 1.1 (*)    All other components within normal limits  COMPREHENSIVE METABOLIC PANEL - Abnormal; Notable for the following:    Glucose, Bld 131 (*)    AST 42 (*)    All other components within normal limits  URINALYSIS, ROUTINE W REFLEX MICROSCOPIC - Abnormal; Notable for the following:    Color, Urine AMBER (*)    APPearance TURBID (*)    Hgb urine dipstick SMALL (*)    Protein, ur 30 (*)    Leukocytes, UA LARGE (*)    All other components within normal limits  URINE MICROSCOPIC-ADD ON - Abnormal; Notable for the following:    Bacteria, UA FEW (*)    All other components within normal limits  URINE RAPID DRUG SCREEN (HOSP PERFORMED)  LIPASE, BLOOD    Imaging Review Ct Abdomen Pelvis W Contrast  05/15/2014   CLINICAL DATA:  Abdominal pain.  EXAM: CT ABDOMEN AND PELVIS WITH CONTRAST  TECHNIQUE: Multidetector CT imaging of the abdomen and pelvis was performed using the standard protocol following bolus administration of intravenous  contrast.  CONTRAST:  169mL OMNIPAQUE IOHEXOL 300 MG/ML  SOLN  COMPARISON:  DG ABD ACUTE W/CHEST dated 05/15/2014; CT ABD/PELVIS W CM dated 10/17/2013  FINDINGS: Lung bases are negative.  Stable 2 cm cyst left lobe of the liver.  Liver otherwise negative.  The spleen, adrenals, pancreas, kidneys are unremarkable. The bowel is negative. The appendix is appreciated unremarkable. Gallbladder fossa is negative.  No abdominal aortic aneurysm nor dissection. The celiac, SMA, IMA, portal vein are opacified. Atherosclerotic calcifications appreciated within the aorta.  No abdominal masses, free fluid, loculated fluid collects no adenopathy.  Stable diverticula are appreciated. Along the urinary bladder wall stable diffuse bladder wall thickening. Otherwise no evidence of pelvic masses, free fluid, nor loculated fluid collections.  No aggressive appearing osseous lesions.  No abdominal wall nor inguinal hernia.  IMPRESSION: Chronic changes  within the urinary bladder.  No evidence of obstructive or inflammatory abnormalities within the abdomen or pelvis.  Stable liver cyst.   Electronically Signed   By: Margaree Mackintosh M.D.   On: 05/15/2014 22:31   Dg Abd Acute W/chest  05/15/2014   CLINICAL DATA:  Lower abdominal pain and vomiting for 1 day. Diabetes. CHF. Smoker  EXAM: ACUTE ABDOMEN SERIES (ABDOMEN 2 VIEW & CHEST 1 VIEW)  COMPARISON:  Chest radiograph of 12/15/2013.  FINDINGS: Frontal view of the chest demonstrates midline trachea. Normal heart size and mediastinal contours. No pleural effusion or pneumothorax. Diffuse peribronchial thickening. Clear lungs.  Abdominal films demonstrate no free intraperitoneal air or air-fluid levels on upright positioning.  Moderate to large amount of stool on supine imaging. Distal gas and stool. No abnormal abdominal calcifications. No appendicolith. Probable phleboliths in the pelvis. Right iliac atherosclerosis.  IMPRESSION: Possible constipation.  Peribronchial thickening which may  relate to chronic bronchitis or smoking.   Electronically Signed   By: Abigail Miyamoto M.D.   On: 05/15/2014 21:14     EKG Interpretation None      MDM   Final diagnoses:  Prostatitis    Patient presents with lower abdominal pain and urinary frequency/dysuria. His vital signs were remarkable for no fever, no tachycardia, no hypotension. Physical exam as above and remarkable for diffuse abdominal TTP that was most severe in suprapubic tenderness to palpation as well as an enlarged and tender prostate.  Given age, abdominal exam, and symptoms it was felt that workup with UA, labs, and CT scan was appropriate.  Workup remarkable for TNTC WBCs on UA but no other abnormalities present.  Given patient is a male, TTP in prostate, and UA findings will discharge on cipro, follow up with urology/PCP for prostatitis.  His repeat abdominal exam was benign including no rebound or guarding.      Corlis Leak, MD 05/15/14 [phone]

## 2014-05-15 NOTE — ED Notes (Signed)
Patient transported to X-ray 

## 2014-05-16 NOTE — ED Provider Notes (Signed)
Medical screening examination/treatment/procedure(s) were conducted as a shared visit with non-physician practitioner(s) or resident and myself. I personally evaluated the patient during the encounter and agree with the findings and plan unless otherwise indicated.  I have personally reviewed any xrays and/ or EKG's with the provider and I agree with interpretation.  63-year-old male with chronic pain, TIA, high blood pressure, diabetes, smoker, UTI presents with diffuse abdominal pain and urinary symptoms. On exam per patient had prostate tenderness. Patient has diffuse abdominal pain worse suprapubic and central, no guarding, lungs clear, regular rate and rhythm. Clinically UTI/prostatitis however significant pain on exam CT scan done, reviewed no acute findings. Plan for oral antibiotics and followup outpatient with primary care provider/urology.  UTI/prostatitis, abdominal pain   Mariea Clonts, MD 05/16/14 0040

## 2014-06-16 ENCOUNTER — Emergency Department (HOSPITAL_COMMUNITY)
Admission: EM | Admit: 2014-06-16 | Discharge: 2014-06-16 | Disposition: A | Discharge status: Home / Self Care | Payer: Medicaid Other | Attending: Emergency Medicine | Admitting: Emergency Medicine

## 2014-06-16 DIAGNOSIS — F1310 Sedative, hypnotic or anxiolytic abuse, uncomplicated: Secondary | ICD-10-CM | POA: Insufficient documentation

## 2014-06-16 DIAGNOSIS — H61891 Other specified disorders of right external ear: Secondary | ICD-10-CM

## 2014-06-16 DIAGNOSIS — Z79899 Other long term (current) drug therapy: Secondary | ICD-10-CM | POA: Insufficient documentation

## 2014-06-16 DIAGNOSIS — Z8673 Personal history of transient ischemic attack (TIA), and cerebral infarction without residual deficits: Secondary | ICD-10-CM | POA: Insufficient documentation

## 2014-06-16 DIAGNOSIS — D496 Neoplasm of unspecified behavior of brain: Secondary | ICD-10-CM | POA: Insufficient documentation

## 2014-06-16 DIAGNOSIS — I509 Heart failure, unspecified: Secondary | ICD-10-CM | POA: Insufficient documentation

## 2014-06-16 DIAGNOSIS — H9209 Otalgia, unspecified ear: Secondary | ICD-10-CM | POA: Insufficient documentation

## 2014-06-16 DIAGNOSIS — I10 Essential (primary) hypertension: Secondary | ICD-10-CM | POA: Insufficient documentation

## 2014-06-16 DIAGNOSIS — Z7982 Long term (current) use of aspirin: Secondary | ICD-10-CM | POA: Insufficient documentation

## 2014-06-16 DIAGNOSIS — F17200 Nicotine dependence, unspecified, uncomplicated: Secondary | ICD-10-CM | POA: Insufficient documentation

## 2014-06-16 MED ORDER — LIDOCAINE-EPINEPHRINE (PF) 2 %-1:200000 IJ SOLN
0.5000 mL | Freq: Once | INTRAMUSCULAR | Status: AC
  Administered 2014-06-16: 0.5 mL

## 2014-06-16 MED ORDER — ACETAMINOPHEN 500 MG PO TABS
1000.0000 mg | ORAL_TABLET | Freq: Once | ORAL | Status: AC
  Administered 2014-06-16: 1000 mg via ORAL
  Filled 2014-06-16: qty 2

## 2014-06-16 NOTE — ED Notes (Signed)
Per EMS, patient called about 40 minutes ago. He felt something crawling in his right ear.  He dug in ear for approximately 30 minutes and couldn't get it out.

## 2014-06-16 NOTE — ED Notes (Signed)
BP 180/82, P 84, RR 18, Hx of HTN.

## 2014-06-16 NOTE — ED Provider Notes (Signed)
CSN: [id number]     Arrival date & time 06/16/14  0501 History   First MD Initiated Contact with Patient 06/16/14 0501     Chief Complaint  Patient presents with  . Foreign Body in [place]     (Consider location/radiation/quality/duration/timing/severity/associated sxs/prior Treatment) The history is provided by the patient.  [name] is a 63 y.o. male hx of HTN, DM, CVA here with foreign body in ear. He was sleeping and felt a bug crawl into his right ear. He was trying to remove it for 30 min and was unable to. Denies fever, purulent discharge from the ear.    Past Medical History  Diagnosis Date  . Hypertension   . Diabetes mellitus   . Brain tumor   . CVA (cerebral vascular accident)   . Narcotic abuse   . CHF (congestive heart failure)    No past surgical history on file. Family History  Problem Relation Age of Onset  . CAD Father   . Diabetes Brother   . Diabetes Sister   . Hypertension Father   . Hypertension Mother    History  Substance Use Topics  . Smoking status: Current Every Day Smoker  . Smokeless tobacco: Not on file  . Alcohol Use: Yes     Comment: 6 pack a week    Review of Systems  HENT: Positive for ear pain.   All other systems reviewed and are negative.     Allergies  Review of patient's allergies indicates no known allergies.  Home Medications   Prior to Admission medications   Medication Sig Start Date End Date Taking? Authorizing Provider  acetaminophen (TYLENOL) 500 MG tablet Take 500 mg by mouth every 6 (six) hours as needed for headache.   Yes Historical Provider, MD  amLODipine (NORVASC) 10 MG tablet Take 10 mg by mouth daily.   Yes Historical Provider, MD  aspirin EC 81 MG tablet Take 81 mg by mouth daily.   Yes Historical Provider, MD  b complex vitamins tablet Take 1 tablet by mouth daily.   Yes Historical Provider, MD  hydrALAZINE (APRESOLINE) 50 MG tablet Take 1 tablet (50 mg total) by mouth every 8 (eight) hours. 12/27/13   Yes Debbe Odea, MD  isosorbide dinitrate (ISORDIL) 10 MG tablet Take 1 tablet (10 mg total) by mouth 3 (three) times daily. 12/27/13  Yes Debbe Odea, MD  metFORMIN (GLUCOPHAGE) 500 MG tablet Take 500 mg by mouth 2 (two) times daily with a meal.   Yes Historical Provider, MD  oxyCODONE-acetaminophen (PERCOCET/ROXICET) 5-325 MG per tablet Take 1 tablet by mouth every 4 (four) hours as needed for severe pain.   Yes Historical Provider, MD  pravastatin (PRAVACHOL) 20 MG tablet Take 20 mg by mouth at bedtime.   Yes Historical Provider, MD  traMADol (ULTRAM) 50 MG tablet Take 50 mg by mouth every 6 (six) hours as needed for moderate pain. 12/15/13  Yes Virgel Manifold, MD   BP 169/76  Pulse 61  Temp(Src) 98.8 F (37.1 C) (Oral)  Resp 18  Ht 5\' 9"  (1.753 m)  Wt 160 lb (72.576 kg)  BMI 23.62 kg/m2  SpO2 99% Physical Exam  Nursing note and vitals reviewed. Constitutional: He is oriented to person, place, and time.  Uncomfortable   HENT:  Head: Normocephalic.  Mouth/Throat: Oropharynx is clear and moist.  No obvious foreign body in R ear. TM intact. L TM and canal normal. Poor dentition but no obvious peri apical abscess. OP clear  Eyes: Conjunctivae are normal. Pupils are equal, round, and reactive to light.  Neck: Normal range of motion. Neck supple.  Cardiovascular: Normal rate.   Pulmonary/Chest: Effort normal.  Abdominal: Soft.  Musculoskeletal: Normal range of motion.  Neurological: He is alert and oriented to person, place, and time.  Skin: Skin is warm and dry.  Psychiatric: He has a normal mood and affect. His behavior is normal. Judgment and thought content normal.    ED Course  Procedures (including critical care time) Labs Review Labs Reviewed - No data to display  Imaging Review No results found.   EKG Interpretation None      MDM   Final diagnoses:  None    [name] is a 63 y.o. male here with foreign body sensation right ear. No obvious foreign  body visualized. Nurse applied lido solution into the canal and irrigated the ear. Only cerumen came out. I think likely had foreign body sensation. Stable for d/c.    Wandra Arthurs, MD 06/16/14 [phone]

## 2014-06-16 NOTE — ED Notes (Signed)
Irrigated ear and inspected with otoscope.  No foreign body noted. Patient does not feel sensation of movement in ear anymore.

## 2014-06-16 NOTE — ED Notes (Signed)
Dr. Darl Householder at the bedside. He gives verbal order to irrigate ear with 2% lidocane for pain management.

## 2014-06-16 NOTE — Discharge Instructions (Signed)
Take tylenol or motrin for pain.   Follow up with your doctor.   Return to ER if you have foreign body in ear, severe pain.

## 2014-08-15 ENCOUNTER — Encounter (HOSPITAL_COMMUNITY): Payer: Self-pay | Admitting: Emergency Medicine

## 2014-08-15 ENCOUNTER — Inpatient Hospital Stay (HOSPITAL_COMMUNITY)
Admission: EM | Admit: 2014-08-15 | Discharge: 2014-08-17 | DRG: 097 | Discharge status: Against Medical Advice | Payer: Medicaid Other | Attending: Internal Medicine | Admitting: Internal Medicine

## 2014-08-15 ENCOUNTER — Emergency Department (HOSPITAL_COMMUNITY): Payer: Medicaid Other

## 2014-08-15 ENCOUNTER — Inpatient Hospital Stay (HOSPITAL_COMMUNITY): Payer: Medicaid Other

## 2014-08-15 DIAGNOSIS — I509 Heart failure, unspecified: Secondary | ICD-10-CM | POA: Diagnosis present

## 2014-08-15 DIAGNOSIS — E872 Acidosis, unspecified: Secondary | ICD-10-CM | POA: Diagnosis present

## 2014-08-15 DIAGNOSIS — E1165 Type 2 diabetes mellitus with hyperglycemia: Secondary | ICD-10-CM

## 2014-08-15 DIAGNOSIS — E119 Type 2 diabetes mellitus without complications: Secondary | ICD-10-CM

## 2014-08-15 DIAGNOSIS — R079 Chest pain, unspecified: Secondary | ICD-10-CM

## 2014-08-15 DIAGNOSIS — G039 Meningitis, unspecified: Secondary | ICD-10-CM | POA: Diagnosis not present

## 2014-08-15 DIAGNOSIS — Z8673 Personal history of transient ischemic attack (TIA), and cerebral infarction without residual deficits: Secondary | ICD-10-CM

## 2014-08-15 DIAGNOSIS — R509 Fever, unspecified: Secondary | ICD-10-CM

## 2014-08-15 DIAGNOSIS — F1910 Other psychoactive substance abuse, uncomplicated: Secondary | ICD-10-CM | POA: Diagnosis present

## 2014-08-15 DIAGNOSIS — R51 Headache: Secondary | ICD-10-CM

## 2014-08-15 DIAGNOSIS — D72829 Elevated white blood cell count, unspecified: Secondary | ICD-10-CM

## 2014-08-15 DIAGNOSIS — IMO0002 Reserved for concepts with insufficient information to code with codable children: Secondary | ICD-10-CM

## 2014-08-15 DIAGNOSIS — G8929 Other chronic pain: Secondary | ICD-10-CM

## 2014-08-15 DIAGNOSIS — I10 Essential (primary) hypertension: Secondary | ICD-10-CM

## 2014-08-15 DIAGNOSIS — N390 Urinary tract infection, site not specified: Secondary | ICD-10-CM | POA: Diagnosis present

## 2014-08-15 DIAGNOSIS — D353 Benign neoplasm of craniopharyngeal duct: Secondary | ICD-10-CM | POA: Diagnosis present

## 2014-08-15 DIAGNOSIS — Z7982 Long term (current) use of aspirin: Secondary | ICD-10-CM | POA: Diagnosis not present

## 2014-08-15 DIAGNOSIS — R519 Headache, unspecified: Secondary | ICD-10-CM

## 2014-08-15 DIAGNOSIS — A419 Sepsis, unspecified organism: Secondary | ICD-10-CM | POA: Diagnosis not present

## 2014-08-15 DIAGNOSIS — D497 Neoplasm of unspecified behavior of endocrine glands and other parts of nervous system: Secondary | ICD-10-CM

## 2014-08-15 DIAGNOSIS — F17200 Nicotine dependence, unspecified, uncomplicated: Secondary | ICD-10-CM | POA: Diagnosis present

## 2014-08-15 DIAGNOSIS — D352 Benign neoplasm of pituitary gland: Secondary | ICD-10-CM | POA: Diagnosis present

## 2014-08-15 LAB — COMPREHENSIVE METABOLIC PANEL
ALBUMIN: 2.9 g/dL — AB (ref 3.5–5.2)
ALT: 29 U/L (ref 0–53)
AST: 30 U/L (ref 0–37)
Alkaline Phosphatase: 63 U/L (ref 39–117)
Anion gap: 11 (ref 5–15)
BILIRUBIN TOTAL: 0.5 mg/dL (ref 0.3–1.2)
BUN: 11 mg/dL (ref 6–23)
CALCIUM: 9 mg/dL (ref 8.4–10.5)
CO2: 25 mEq/L (ref 19–32)
CREATININE: 0.88 mg/dL (ref 0.50–1.35)
Chloride: 104 mEq/L (ref 96–112)
GFR calc Af Amer: >90 mL/min (ref >90)
GFR calc non Af Amer: >90 mL/min (ref >90)
Glucose, Bld: 147 mg/dL — ABNORMAL HIGH (ref 70–99)
Potassium: 3.9 mEq/L (ref 3.7–5.3)
Sodium: 140 mEq/L (ref 137–147)
Total Protein: 6.8 g/dL (ref 6.0–8.3)

## 2014-08-15 LAB — URINALYSIS, ROUTINE W REFLEX MICROSCOPIC
BILIRUBIN URINE: NEGATIVE
Glucose, UA: NEGATIVE mg/dL
Hgb urine dipstick: NEGATIVE
Ketones, ur: NEGATIVE mg/dL
NITRITE: POSITIVE — AB
Protein, ur: NEGATIVE mg/dL
SPECIFIC GRAVITY, URINE: 1.012 (ref 1.005–1.030)
UROBILINOGEN UA: 2 mg/dL — AB (ref 0.0–1.0)
pH: 8 (ref 5.0–8.0)

## 2014-08-15 LAB — I-STAT TROPONIN, ED: Troponin i, poc: 0.15 ng/mL (ref 0.00–0.08)

## 2014-08-15 LAB — BASIC METABOLIC PANEL
Anion gap: 17 — ABNORMAL HIGH (ref 5–15)
BUN: 12 mg/dL (ref 6–23)
CO2: 22 mEq/L (ref 19–32)
Calcium: 9.6 mg/dL (ref 8.4–10.5)
Chloride: 100 mEq/L (ref 96–112)
Creatinine, Ser: 0.88 mg/dL (ref 0.50–1.35)
GFR calc Af Amer: >90 mL/min (ref >90)
Glucose, Bld: 112 mg/dL — ABNORMAL HIGH (ref 70–99)
POTASSIUM: 3.5 meq/L — AB (ref 3.7–5.3)
Sodium: 139 mEq/L (ref 137–147)

## 2014-08-15 LAB — RAPID URINE DRUG SCREEN, HOSP PERFORMED
Amphetamines: NOT DETECTED
BENZODIAZEPINES: NOT DETECTED
Barbiturates: NOT DETECTED
COCAINE: NOT DETECTED
OPIATES: POSITIVE — AB
Tetrahydrocannabinol: NOT DETECTED

## 2014-08-15 LAB — HEPATIC FUNCTION PANEL
ALT: 28 U/L (ref 0–53)
AST: 32 U/L (ref 0–37)
Albumin: 2.9 g/dL — ABNORMAL LOW (ref 3.5–5.2)
Alkaline Phosphatase: 76 U/L (ref 39–117)
BILIRUBIN DIRECT: 0.3 mg/dL (ref 0.0–0.3)
Indirect Bilirubin: 0.4 mg/dL (ref 0.3–0.9)
TOTAL PROTEIN: 6.6 g/dL (ref 6.0–8.3)
Total Bilirubin: 0.7 mg/dL (ref 0.3–1.2)

## 2014-08-15 LAB — CBC WITH DIFFERENTIAL/PLATELET
Basophils Absolute: 0 10*3/uL (ref 0.0–0.1)
Basophils Relative: 0 % (ref 0–1)
Eosinophils Absolute: 0 10*3/uL (ref 0.0–0.7)
Eosinophils Relative: 0 % (ref 0–5)
HEMATOCRIT: 38.7 % — AB (ref 39.0–52.0)
Hemoglobin: 12.5 g/dL — ABNORMAL LOW (ref 13.0–17.0)
LYMPHS PCT: 10 % — AB (ref 12–46)
Lymphs Abs: 2.5 10*3/uL (ref 0.7–4.0)
MCH: 23.7 pg — ABNORMAL LOW (ref 26.0–34.0)
MCHC: 32.3 g/dL (ref 30.0–36.0)
MCV: 73.4 fL — ABNORMAL LOW (ref 78.0–100.0)
MONOS PCT: 5 % (ref 3–12)
Monocytes Absolute: 1.2 10*3/uL — ABNORMAL HIGH (ref 0.1–1.0)
NEUTROS ABS: 20.8 10*3/uL — AB (ref 1.7–7.7)
Neutrophils Relative %: 85 % — ABNORMAL HIGH (ref 43–77)
Platelets: 218 10*3/uL (ref 150–400)
RBC: 5.27 MIL/uL (ref 4.22–5.81)
RDW: 14.8 % (ref 11.5–15.5)
WBC: 24.5 10*3/uL — AB (ref 4.0–10.5)

## 2014-08-15 LAB — PROTIME-INR
INR: 0.99 (ref 0.00–1.49)
Prothrombin Time: 13.1 seconds (ref 11.6–15.2)

## 2014-08-15 LAB — I-STAT ARTERIAL BLOOD GAS, ED
Acid-Base Excess: 4 mmol/L — ABNORMAL HIGH (ref 0.0–2.0)
Bicarbonate: 23.7 mEq/L (ref 20.0–24.0)
O2 SAT: 98 %
PH ART: 7.572 — AB (ref 7.350–7.450)
Patient temperature: 104.4
TCO2: 24 mmol/L (ref 0–100)
pCO2 arterial: 26.3 mmHg — ABNORMAL LOW (ref 35.0–45.0)
pO2, Arterial: 99 mmHg (ref 80.0–100.0)

## 2014-08-15 LAB — GLUCOSE, CAPILLARY
GLUCOSE-CAPILLARY: 97 mg/dL (ref 70–99)
Glucose-Capillary: 159 mg/dL — ABNORMAL HIGH (ref 70–99)
Glucose-Capillary: 119 mg/dL — ABNORMAL HIGH (ref 70–99)

## 2014-08-15 LAB — TROPONIN I
Troponin I: <0.3 ng/mL (ref <0.30)
Troponin I: <0.3 ng/mL (ref <0.30)
Troponin I: <0.3 ng/mL (ref <0.30)

## 2014-08-15 LAB — CBC
HEMATOCRIT: 40.4 % (ref 39.0–52.0)
Hemoglobin: 13.2 g/dL (ref 13.0–17.0)
MCH: 23.7 pg — ABNORMAL LOW (ref 26.0–34.0)
MCHC: 32.7 g/dL (ref 30.0–36.0)
MCV: 72.7 fL — ABNORMAL LOW (ref 78.0–100.0)
Platelets: 209 10*3/uL (ref 150–400)
RBC: 5.56 MIL/uL (ref 4.22–5.81)
RDW: 14.8 % (ref 11.5–15.5)
WBC: 14 10*3/uL — ABNORMAL HIGH (ref 4.0–10.5)

## 2014-08-15 LAB — URINE MICROSCOPIC-ADD ON

## 2014-08-15 LAB — LIPASE, BLOOD: LIPASE: 19 U/L (ref 11–59)

## 2014-08-15 LAB — I-STAT CG4 LACTIC ACID, ED: Lactic Acid, Venous: 2.78 mmol/L — ABNORMAL HIGH (ref 0.5–2.2)

## 2014-08-15 LAB — MRSA PCR SCREENING: MRSA BY PCR: NEGATIVE

## 2014-08-15 LAB — RAPID STREP SCREEN (MED CTR MEBANE ONLY): STREPTOCOCCUS, GROUP A SCREEN (DIRECT): NEGATIVE

## 2014-08-15 LAB — TSH: TSH: 0.241 u[IU]/mL — ABNORMAL LOW (ref 0.350–4.500)

## 2014-08-15 LAB — CLOSTRIDIUM DIFFICILE BY PCR: CDIFFPCR: NEGATIVE

## 2014-08-15 LAB — LACTIC ACID, PLASMA: Lactic Acid, Venous: 1.5 mmol/L (ref 0.5–2.2)

## 2014-08-15 LAB — RAPID HIV SCREEN (WH-MAU): SUDS RAPID HIV SCREEN: NONREACTIVE

## 2014-08-15 MED ORDER — MORPHINE SULFATE 4 MG/ML IJ SOLN
4.0000 mg | Freq: Once | INTRAMUSCULAR | Status: AC
  Administered 2014-08-15: 4 mg via INTRAVENOUS
  Filled 2014-08-15: qty 1

## 2014-08-15 MED ORDER — AMLODIPINE BESYLATE 10 MG PO TABS
10.0000 mg | ORAL_TABLET | Freq: Every day | ORAL | Status: DC
  Administered 2014-08-15 – 2014-08-17 (×3): 10 mg via ORAL
  Filled 2014-08-15 (×3): qty 1

## 2014-08-15 MED ORDER — MORPHINE SULFATE 2 MG/ML IJ SOLN
2.0000 mg | INTRAMUSCULAR | Status: DC | PRN
  Administered 2014-08-15 – 2014-08-17 (×15): 2 mg via INTRAVENOUS
  Filled 2014-08-15 (×14): qty 1

## 2014-08-15 MED ORDER — IOHEXOL 350 MG/ML SOLN
100.0000 mL | Freq: Once | INTRAVENOUS | Status: AC | PRN
  Administered 2014-08-15: 100 mL via INTRAVENOUS

## 2014-08-15 MED ORDER — VANCOMYCIN HCL IN DEXTROSE 750-5 MG/150ML-% IV SOLN
750.0000 mg | Freq: Three times a day (TID) | INTRAVENOUS | Status: DC
  Administered 2014-08-15 – 2014-08-17 (×6): 750 mg via INTRAVENOUS
  Filled 2014-08-15 (×8): qty 150

## 2014-08-15 MED ORDER — SIMVASTATIN 10 MG PO TABS
10.0000 mg | ORAL_TABLET | Freq: Every day | ORAL | Status: DC
  Administered 2014-08-15 – 2014-08-16 (×2): 10 mg via ORAL
  Filled 2014-08-15 (×3): qty 1

## 2014-08-15 MED ORDER — ACETAMINOPHEN 325 MG PO TABS
650.0000 mg | ORAL_TABLET | Freq: Four times a day (QID) | ORAL | Status: DC | PRN
  Administered 2014-08-15: 650 mg via ORAL
  Filled 2014-08-15: qty 2

## 2014-08-15 MED ORDER — SODIUM CHLORIDE 0.9 % IV SOLN
INTRAVENOUS | Status: DC
  Administered 2014-08-15: 06:00:00 via INTRAVENOUS

## 2014-08-15 MED ORDER — PIPERACILLIN-TAZOBACTAM 3.375 G IVPB 30 MIN
3.3750 g | Freq: Once | INTRAVENOUS | Status: AC
  Administered 2014-08-15: 3.375 g via INTRAVENOUS
  Filled 2014-08-15: qty 50

## 2014-08-15 MED ORDER — DEXTROSE 5 % IV SOLN
700.0000 mg | Freq: Three times a day (TID) | INTRAVENOUS | Status: DC
  Administered 2014-08-15 – 2014-08-16 (×4): 700 mg via INTRAVENOUS
  Filled 2014-08-15 (×7): qty 14

## 2014-08-15 MED ORDER — SODIUM CHLORIDE 0.9 % IV SOLN
1000.0000 mL | INTRAVENOUS | Status: DC
  Administered 2014-08-15: 1000 mL via INTRAVENOUS

## 2014-08-15 MED ORDER — DEXTROSE 5 % IV SOLN
2.0000 g | Freq: Once | INTRAVENOUS | Status: AC
  Administered 2014-08-15: 2 g via INTRAVENOUS
  Filled 2014-08-15: qty 2

## 2014-08-15 MED ORDER — ENSURE COMPLETE PO LIQD
237.0000 mL | Freq: Two times a day (BID) | ORAL | Status: DC
  Administered 2014-08-15 – 2014-08-17 (×5): 237 mL via ORAL

## 2014-08-15 MED ORDER — PIPERACILLIN-TAZOBACTAM 3.375 G IVPB
3.3750 g | Freq: Three times a day (TID) | INTRAVENOUS | Status: DC
Start: 2014-08-15 — End: 2014-08-15
  Administered 2014-08-15: 3.375 g via INTRAVENOUS
  Filled 2014-08-15 (×2): qty 50

## 2014-08-15 MED ORDER — SODIUM CHLORIDE 0.9 % IV SOLN
INTRAVENOUS | Status: AC
  Administered 2014-08-15 – 2014-08-16 (×2): via INTRAVENOUS

## 2014-08-15 MED ORDER — VANCOMYCIN HCL IN DEXTROSE 1-5 GM/200ML-% IV SOLN
1000.0000 mg | Freq: Two times a day (BID) | INTRAVENOUS | Status: DC
  Administered 2014-08-15: 1000 mg via INTRAVENOUS
  Filled 2014-08-15: qty 200

## 2014-08-15 MED ORDER — VANCOMYCIN HCL IN DEXTROSE 1-5 GM/200ML-% IV SOLN
1000.0000 mg | Freq: Once | INTRAVENOUS | Status: AC
  Administered 2014-08-15: 1000 mg via INTRAVENOUS
  Filled 2014-08-15: qty 200

## 2014-08-15 MED ORDER — ONDANSETRON HCL 4 MG/2ML IJ SOLN
4.0000 mg | Freq: Once | INTRAMUSCULAR | Status: AC
  Administered 2014-08-15: 4 mg via INTRAVENOUS
  Filled 2014-08-15: qty 2

## 2014-08-15 MED ORDER — HYDRALAZINE HCL 50 MG PO TABS
50.0000 mg | ORAL_TABLET | Freq: Three times a day (TID) | ORAL | Status: DC
  Administered 2014-08-15 – 2014-08-17 (×6): 50 mg via ORAL
  Filled 2014-08-15 (×10): qty 1

## 2014-08-15 MED ORDER — ACETAMINOPHEN 325 MG PO TABS: 650.0000 mg | ORAL_TABLET | ORAL | Status: DC | PRN

## 2014-08-15 MED ORDER — SODIUM CHLORIDE 0.9 % IV SOLN
1000.0000 mL | Freq: Once | INTRAVENOUS | Status: AC
  Administered 2014-08-15: 1000 mL via INTRAVENOUS

## 2014-08-15 MED ORDER — INSULIN ASPART 100 UNIT/ML ~~LOC~~ SOLN
0.0000 [IU] | Freq: Three times a day (TID) | SUBCUTANEOUS | Status: DC
  Administered 2014-08-15 – 2014-08-16 (×2): 2 [IU] via SUBCUTANEOUS
  Administered 2014-08-16: 1 [IU] via SUBCUTANEOUS
  Administered 2014-08-16 – 2014-08-17 (×2): 2 [IU] via SUBCUTANEOUS
  Administered 2014-08-17: 1 [IU] via SUBCUTANEOUS

## 2014-08-15 MED ORDER — SODIUM CHLORIDE 0.9 % IV SOLN
2.0000 g | INTRAVENOUS | Status: DC
  Administered 2014-08-15 – 2014-08-16 (×5): 2 g via INTRAVENOUS
  Filled 2014-08-15 (×8): qty 2000

## 2014-08-15 MED ORDER — TRAMADOL HCL 50 MG PO TABS
50.0000 mg | ORAL_TABLET | Freq: Four times a day (QID) | ORAL | Status: DC | PRN
  Administered 2014-08-15 – 2014-08-17 (×3): 50 mg via ORAL
  Filled 2014-08-15 (×3): qty 1

## 2014-08-15 MED ORDER — CEFTRIAXONE SODIUM 2 G IJ SOLR
2.0000 g | Freq: Two times a day (BID) | INTRAMUSCULAR | Status: DC
  Administered 2014-08-15 – 2014-08-17 (×5): 2 g via INTRAVENOUS
  Filled 2014-08-15 (×6): qty 2

## 2014-08-15 MED ORDER — ISOSORBIDE DINITRATE 10 MG PO TABS
10.0000 mg | ORAL_TABLET | Freq: Three times a day (TID) | ORAL | Status: DC
  Administered 2014-08-15 – 2014-08-17 (×7): 10 mg via ORAL
  Filled 2014-08-15 (×9): qty 1

## 2014-08-15 MED ORDER — GADOBENATE DIMEGLUMINE 529 MG/ML IV SOLN
8.0000 mL | Freq: Once | INTRAVENOUS | Status: AC | PRN
  Administered 2014-08-15: 8 mL via INTRAVENOUS

## 2014-08-15 NOTE — Progress Notes (Signed)
[place] TEAM 1 - Stepdown/ICU TEAM Progress Note  MYKING SAR QIH:[id number] DOB: 08/10/1952 DOA: 08/15/2014 PCP: Philis Fendt, MD  Admit HPI / Brief Narrative: [name] is a 63 y.o. BM PMHx diabetes mellitus, hypertension, pituitary tumor woke up from the sleep at midnight with complaints of severe headache neck pain chest pain and fever with nausea vomiting. Patient states he has chronic headache and also has history of pituitary tumor for which he is following up with Dr. Saintclair Halsted neurosurgeon. Headache is mostly frontal and was associated with some blurred vision and nausea vomiting. At the same time patient also had neck pain and chest pain. The chest pain was reproducible in the ER on the left anterior chest wall. Denies any associated shortness of breath. In the ER patient was found to have fever of 104F. Blood cultures were obtained. Lactic acid was mildly elevated. Patient's CT head did not show anything acute. Lumbar puncture was attempted but was unable to succeed by the ED physician. UA showing features consistent with UTI and patient was started on empiric antibiotics to cover for possible sepsis. On my exam patient presently is able to see things clearly and is nonfocal. Still has neck rigidity and headache. Denies any recent travel anywhere outside of any tic bites are insect bites. Denies any sick contacts. Denies having used any IV drugs recently. Has been using Percocet for chronic headaches. His fever has improved after coming to the ER and is present around 42F. Patient did have nausea vomiting but denies any abdominal pain at all last 2 days has been having some dysuria and diarrhea. Denies any penile discharge. Patient is feeling fatigued and tired. Chest pain is left-sided anterior chest wall stabbing-like.   HPI/Subjective: 8/18 states current headache rated at 7/10 which is different from the headaches he has been experiencing from his pituitary tumor over the last  year. Positive photophobia, positive N./V. which has resolved since admission, plus diaphoretic/Rigors which has resolved since admission.  Assessment/Plan: Severe headache with fever -Patient with history of headaches from his pituitary gland tumor however per patient these headaches much more severe and accompanied by other neurologic signs photophobia, stiff neck, fever, chills, Rigors. -Failed attempted LP in the ED patient scheduled for LP by IR (Fluoroscopy guided lumbar) -Continue empiric antibiotics - All cultures pending  -Neurosurgery has been consulted  -UDS; positive for opiates (note patient is on home opioids) -Morphine 2 mg q 3 hours PRN  Chest pain  - Resolved  -Troponin x3 negative  -ChestCT and abdominal CT ; negative for any dissection but did show thickened bladder wall with diverticuli. See results below  UTI  -Current antibiotic regimen would cover a bacteria typical for UTI  Pituitary tumor  - see #1.  Hypertension  - Amlodipine 10 mg continue home medications. -Hydralazine 50 mg TID -Isordil 10 mg TID  Diabetes mellitus  - hold metformin  -Continue sensitive SSI History of drug abuse - patient denies taking any IV drugs recently. Check urine drug screen. -Obtain he will A1c -Obtain lipid panel  Code Status: FULL Family Communication: no family present at time of exam Disposition Plan: ??    Consultants: Spoke with Dr. Jovita Gamma (neurosurgery); okayed LP  Procedure/Significant Events: 8/18 CT head without contrast; Brain: No evidence of acute abnormality, such as acute infarction,  hemorrhage, hydrocephalus, or mass lesion/mass effect.  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 8/18 CT abdomen pelvis with and without  contrast:No CT findings for a thoracic aortic dissection or aneurysm. pulmonary arteries are normal.-Borderline mediastinal and hilar lymph nodes but no mass. 8/18 CT  angiogram chest with and without contrast;No aneurysm or dissection/PE .-Stable left hepatic lobe liver lesion since a chest CT of 2012. Likely a hemangioma.  8/18 MRI brain pending 8/18 LP by IR pending    Culture 8/18 blood x2 pending 8/18 group A strep pending 8/18 urine pending 8/18 or so by PCR negative 8/18 C. difficile pending    Antibiotics: Ceftriaxone 8/18 Acyclovir 8/18 Vancomycin 8/18 Ampicillin 8/18   DVT prophylaxis: SCD   Devices NA  LINES / TUBES:  8/18 20ga right arm 8/18 20 ga left forearm    Continuous Infusions: . sodium chloride 100 mL/hr at 08/15/14 0730    Objective: VITAL SIGNS: Temp: 97 F (36.1 C) (08/18 1225) Temp src: Oral (08/18 1225) BP: 112/57 mmHg (08/18 1200) Pulse Rate: 67 (08/18 1225) SPO2; FIO2:   Intake/Output Summary (Last 24 hours) at 08/15/14 1441 Last data filed at 08/15/14 1324  Gross per 24 hour  Intake 1126.33 ml  Output    750 ml  Net 376.33 ml     Exam: General: A./O. x4, mild distress secondary to continuing headache, No acute respiratory distress Lungs: Clear to auscultation bilaterally without wheezes or crackles Cardiovascular: Regular rate and rhythm without murmur gallop or rub normal S1 and S2 Abdomen: Nontender, nondistended, soft, bowel sounds positive, no rebound, no ascites, no appreciable mass Extremities: No significant cyanosis, clubbing, or edema bilateral lower extremities Neurologic; cranial nerves II through XII intact, tongue/uvula midline, all extremity strength 5/5, sensation intact throughout, positive Brudzinski sign (Kernig's sign not assessed patient had failed LP this a.m.), positive photophobia  Data Reviewed: Basic Metabolic Panel:  Recent Labs Lab 08/15/14 0042 08/15/14 0751  NA 139 140  K 3.5* 3.9  CL 100 104  CO2 22 25  GLUCOSE 112* 147*  BUN 12 11  CREATININE 0.88 0.88  CALCIUM 9.6 9.0   Liver Function Tests:  Recent Labs Lab 08/15/14 0426 08/15/14 0751   AST 32 30  ALT 28 29  ALKPHOS 76 63  BILITOT 0.7 0.5  PROT 6.6 6.8  ALBUMIN 2.9* 2.9*    Recent Labs Lab 08/15/14 0426  LIPASE 19   No results found for this basename: AMMONIA,  in the last 168 hours CBC:  Recent Labs Lab 08/15/14 0042 08/15/14 0751  WBC 14.0* 24.5*  NEUTROABS  --  20.8*  HGB 13.2 12.5*  HCT 40.4 38.7*  MCV 72.7* 73.4*  PLT 209 218   Cardiac Enzymes:  Recent Labs Lab 08/15/14 0042 08/15/14 0426 08/15/14 0955  TROPONINI <0.30 <0.30 <0.30   BNP (last 3 results)  Recent Labs  12/10/13 0017 12/15/13 0605  PROBNP 51.9 72.5   CBG:  Recent Labs Lab 08/15/14 0735 08/15/14 1241  GLUCAP 159* 97    Recent Results (from the past 240 hour(s))  RAPID STREP SCREEN     Status: None   Collection Time    08/15/14 12:50 AM      Result Value Ref Range Status   Streptococcus, Group A Screen (Direct) NEGATIVE  NEGATIVE Final   Comment: (NOTE)     A Rapid Antigen test may result negative if the antigen level in the     sample is below the detection level of this test. The FDA has not     cleared this test as a stand-alone test therefore the rapid antigen  negative result has reflexed to a Group A Strep culture.  MRSA PCR SCREENING     Status: None   Collection Time    08/15/14  7:31 AM      Result Value Ref Range Status   MRSA by PCR NEGATIVE  NEGATIVE Final   Comment:            The GeneXpert MRSA Assay (FDA     approved for NASAL specimens     only), is one component of a     comprehensive MRSA colonization     surveillance program. It is not     intended to diagnose MRSA     infection nor to guide or     monitor treatment for     MRSA infections.  CLOSTRIDIUM DIFFICILE BY PCR     Status: None   Collection Time    08/15/14 10:56 AM      Result Value Ref Range Status   C difficile by pcr NEGATIVE  NEGATIVE Final     Studies:  Recent x-ray studies have been reviewed in detail by the Attending Physician  Scheduled  Meds:  Scheduled Meds: . acyclovir  700 mg Intravenous 3 times per day  . amLODipine  10 mg Oral Daily  . ampicillin (OMNIPEN) IV  2 g Intravenous 6 times per day  . cefTRIAXone (ROCEPHIN)  IV  2 g Intravenous Q12H  . feeding supplement (ENSURE COMPLETE)  237 mL Oral BID BM  . hydrALAZINE  50 mg Oral 3 times per day  . insulin aspart  0-9 Units Subcutaneous TID WC  . isosorbide dinitrate  10 mg Oral TID  . simvastatin  10 mg Oral q1800  . vancomycin  750 mg Intravenous Q8H    Time spent on care of this patient: 40 mins   Allie Bossier , MD   [facility] Office  [phone] Pager [phone]  On-Call/Text Page:      [email or web address]      password [email or web address]  If 7PM-7AM, please contact night-coverage [email or web address] Password [email or web address] 08/15/2014, 2:41 PM   LOS: 0 days

## 2014-08-15 NOTE — ED Provider Notes (Signed)
CSN: [id number]     Arrival date & time 08/15/14  0010 History   First MD Initiated Contact with Patient 08/15/14 [phone]     Chief Complaint  Patient presents with  . Fever  . Chest Pain     (Consider location/radiation/quality/duration/timing/severity/associated sxs/prior Treatment) HPI Comments: Patient presents by EMS with generalized weakness, fever and chest pain. Symptoms started this evening he woke up from sleep. Complains of feeling weak all over and left-sided chest pain that onset on the way to the hospital. The pain is constant and worse with palpation. He's had a fever of 105 on arrival. He endorses headache, chest pain, low back pain, chills and generalized weakness and body aches. He denies any focal weakness, numbness or tingling. Denies any bowel or bladder incontinence. Denies any IV drug abuse. Denies any recent travel or sick contacts. Denies any cough, runny nose, sore throat, urinary symptoms  The history is provided by the patient and the EMS personnel. The history is limited by the condition of the patient.    Past Medical History  Diagnosis Date  . Hypertension   . Diabetes mellitus   . Brain tumor   . CVA (cerebral vascular accident)   . Narcotic abuse   . CHF (congestive heart failure)    History reviewed. No pertinent past surgical history. Family History  Problem Relation Age of Onset  . CAD Father   . Diabetes Brother   . Diabetes Sister   . Hypertension Father   . Hypertension Mother    History  Substance Use Topics  . Smoking status: Current Every Day Smoker  . Smokeless tobacco: Not on file  . Alcohol Use: Yes     Comment: 6 pack a week    Review of Systems  Constitutional: Positive for fever, activity change, appetite change and fatigue.  Respiratory: Positive for chest tightness. Negative for cough and shortness of breath.   Cardiovascular: Positive for chest pain.  Gastrointestinal: Negative for nausea, vomiting, abdominal pain and  rectal pain.  Genitourinary: Negative for dysuria and hematuria.  Musculoskeletal: Positive for arthralgias and myalgias.  Skin: Negative for rash.  Neurological: Positive for dizziness, weakness and headaches.  A complete 10 system review of systems was obtained and all systems are negative except as noted in the HPI and PMH.      Allergies  Review of patient's allergies indicates no known allergies.  Home Medications   Prior to Admission medications   Medication Sig Start Date End Date Taking? Authorizing Provider  acetaminophen (TYLENOL) 500 MG tablet Take 500 mg by mouth every 6 (six) hours as needed for headache.   Yes Historical Provider, MD  amLODipine (NORVASC) 10 MG tablet Take 10 mg by mouth daily.   Yes Historical Provider, MD  aspirin EC 81 MG tablet Take 81 mg by mouth daily.   Yes Historical Provider, MD  b complex vitamins tablet Take 1 tablet by mouth daily.   Yes Historical Provider, MD  hydrALAZINE (APRESOLINE) 50 MG tablet Take 1 tablet (50 mg total) by mouth every 8 (eight) hours. 12/27/13  Yes Debbe Odea, MD  isosorbide dinitrate (ISORDIL) 10 MG tablet Take 1 tablet (10 mg total) by mouth 3 (three) times daily. 12/27/13  Yes Debbe Odea, MD  metFORMIN (GLUCOPHAGE) 500 MG tablet Take 500 mg by mouth 2 (two) times daily with a meal.   Yes Historical Provider, MD  oxyCODONE-acetaminophen (PERCOCET/ROXICET) 5-325 MG per tablet Take 1 tablet by mouth every 4 (four) hours as needed  for severe pain.   Yes Historical Provider, MD  pravastatin (PRAVACHOL) 20 MG tablet Take 20 mg by mouth at bedtime.   Yes Historical Provider, MD  traMADol (ULTRAM) 50 MG tablet Take 50 mg by mouth every 6 (six) hours as needed for moderate pain. 12/15/13  Yes Virgel Manifold, MD   BP 127/58  Pulse 64  Temp(Src) 99 F (37.2 C) (Rectal)  Resp 10  Ht 5\' 9"  (1.753 m)  Wt 160 lb (72.576 kg)  BMI 23.62 kg/m2  SpO2 98% Physical Exam  Nursing note and vitals reviewed. Constitutional: He is  oriented to person, place, and time. He appears well-developed and well-nourished. No distress.  uncomfortable  HENT:  Head: Normocephalic and atraumatic.  Mouth/Throat: Oropharynx is clear and moist. No oropharyngeal exudate.  Eyes: Conjunctivae and EOM are normal. Pupils are equal, round, and reactive to light.  Neck: Normal range of motion.  Reduced ROM of neck with stiffness  Cardiovascular: Normal rate, regular rhythm, normal heart sounds and intact distal pulses.   No murmur heard. Pulmonary/Chest: Effort normal and breath sounds normal. No respiratory distress. He exhibits tenderness.  TTP L chest wall  Abdominal: Soft. There is no tenderness. There is no rebound and no guarding.  Musculoskeletal: Normal range of motion. He exhibits no edema and no tenderness.  Neurological: He is alert and oriented to person, place, and time. No cranial nerve deficit. He exhibits normal muscle tone. Coordination normal.  No ataxia on finger to nose bilaterally. No pronator drift. 5/5 strength throughout. CN 2-12 intact. Negative Romberg. Equal grip strength. Sensation intact. Gait is normal.   Skin: Skin is warm.  Psychiatric: He has a normal mood and affect. His behavior is normal.    ED Course  LUMBAR PUNCTURE Date/Time: 08/15/2014 3:46 AM Performed by: [name] Authorized by: [name] Consent: Verbal consent obtained. written consent obtained. Risks and benefits: risks, benefits and alternatives were discussed Consent given by: patient Patient consent: the patient's understanding of the procedure matches consent given Procedure consent: procedure consent matches procedure scheduled Relevant documents: relevant documents present and verified Test results: test results available and properly labeled Site marked: the operative site was marked Imaging studies: imaging studies available Required items: required blood products, implants, devices, and special equipment  available Patient identity confirmed: verbally with patient and provided demographic data Time out: Immediately prior to procedure a "time out" was called to verify the correct patient, procedure, equipment, support staff and site/side marked as required. Indications: evaluation for infection Anesthesia: local infiltration Local anesthetic: lidocaine 1% without epinephrine Anesthetic total: 5 ml Patient sedated: no Preparation: Patient was prepped and draped in the usual sterile fashion. Lumbar space: L4-L5 interspace Patient's position: sitting Needle gauge: 20 Needle type: spinal needle - Quincke tip Needle length: 3.5 in Number of attempts: 3 Total volume: 0 ml Post-procedure: site cleaned and adhesive bandage applied Patient tolerance: Patient tolerated the procedure well with no immediate complications. Comments: unsuccessful   (including critical care time) Labs Review Labs Reviewed  CBC - Abnormal; Notable for the following:    WBC 14.0 (*)    MCV 72.7 (*)    MCH 23.7 (*)    All other components within normal limits  BASIC METABOLIC PANEL - Abnormal; Notable for the following:    Potassium 3.5 (*)    Glucose, Bld 112 (*)    Anion gap 17 (*)    All other components within normal limits  URINALYSIS, ROUTINE W REFLEX MICROSCOPIC - Abnormal; Notable for  the following:    APPearance CLOUDY (*)    Urobilinogen, UA 2.0 (*)    Nitrite POSITIVE (*)    Leukocytes, UA SMALL (*)    All other components within normal limits  URINE MICROSCOPIC-ADD ON - Abnormal; Notable for the following:    Bacteria, UA MANY (*)    All other components within normal limits  HEPATIC FUNCTION PANEL - Abnormal; Notable for the following:    Albumin 2.9 (*)    All other components within normal limits  I-STAT TROPOININ, ED - Abnormal; Notable for the following:    Troponin i, poc 0.15 (*)    All other components within normal limits  I-STAT CG4 LACTIC ACID, ED - Abnormal; Notable for the  following:    Lactic Acid, Venous 2.78 (*)    All other components within normal limits  I-STAT ARTERIAL BLOOD GAS, ED - Abnormal; Notable for the following:    pH, Arterial 7.572 (*)    pCO2 arterial 26.3 (*)    Acid-Base Excess 4.0 (*)    All other components within normal limits  RAPID STREP SCREEN  CULTURE, BLOOD (ROUTINE X 2)  CULTURE, BLOOD (ROUTINE X 2)  URINE CULTURE  CULTURE, GROUP A STREP  CSF CULTURE  TROPONIN I  PROTIME-INR  RAPID HIV SCREEN ([facility])  LIPASE, BLOOD  TROPONIN I  CSF CELL COUNT WITH DIFFERENTIAL  CSF CELL COUNT WITH DIFFERENTIAL  PROTEIN AND GLUCOSE, CSF  URINE RAPID DRUG SCREEN (HOSP PERFORMED)  I-STAT CG4 LACTIC ACID, ED    Imaging Review Ct Head Wo Contrast  08/15/2014   CLINICAL DATA:  Fever and headache  EXAM: CT HEAD WITHOUT CONTRAST  TECHNIQUE: Contiguous axial images were obtained from the base of the skull through the vertex without intravenous contrast.  COMPARISON:  07/01/2013  FINDINGS: Skull and Sinuses:No acute fracture or destructive process.  Mild inflammatory mucosal thickening in the bilateral ethmoid sinuses.  A periapical cyst associated with a maxillary incisor noted on 12/26/2013 brain MRI is not clearly seen on this examination.  Orbits: No acute abnormality.  Brain: No evidence of acute abnormality, such as acute infarction, hemorrhage, hydrocephalus, or mass lesion/mass effect.  The patient has a known pituitary adenoma which is not well resolved by CT. No gross indication of interval hemorrhage.  IMPRESSION: 1. No acute intracranial abnormality. 2. Chronic findings are discussed above.   Electronically Signed   By: Jorje Guild M.D.   On: 08/15/2014 02:33   Dg Chest Port 1 View  08/15/2014   CLINICAL DATA:  Fever and chest pain  EXAM: PORTABLE CHEST - 1 VIEW  COMPARISON:  05/15/2014  FINDINGS: No cardiomegaly for technique. Negative mediastinal contours. There is no edema, consolidation, effusion, or pneumothorax.  IMPRESSION: No  active disease.   Electronically Signed   By: Jorje Guild M.D.   On: 08/15/2014 00:37   Ct Angio Chest Aortic Dissect W &/or W/o  08/15/2014   CLINICAL DATA:  Chest pain.  EXAM: CT ANGIOGRAPHY CHEST, ABDOMEN AND PELVIS  TECHNIQUE: Multidetector CT imaging through the chest, abdomen and pelvis was performed using the standard protocol during bolus administration of intravenous contrast. Multiplanar reconstructed images and MIPs were obtained and reviewed to evaluate the vascular anatomy.  CONTRAST:  150mL OMNIPAQUE IOHEXOL 350 MG/ML SOLN  COMPARISON:  None.  FINDINGS: CTA CHEST FINDINGS  The thoracic aorta is normal. No aneurysm or dissection. The pulmonary arteries are normal. No pulmonary emboli. The heart is normal in size. No pericardial effusion. No mediastinal  or hilar mass or adenopathy. There are scattered borderline lymph nodes. Coronary artery calcifications are noted.  The lungs demonstrate dependent bibasilar atelectasis. No edema or effusions. No worrisome pulmonary lesions.  Review of the MIP images confirms the above findings.  CTA ABDOMEN AND PELVIS FINDINGS  The abdominal aorta demonstrates moderate atherosclerotic calcifications. The major branch vessels are patent. No aneurysm or dissection. 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  Stable left hepatic lobe liver lesion since a chest CT of 2012. 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 The gallbladder is normal. No common bile duct dilatation. The pancreas is unremarkable. The spleen is normal in size. Stable enhancing splenic lesion is likely a hemangioma. The adrenal glands and kidneys are unremarkable.  The stomach, duodenum, small bowel and colon are grossly normal without oral contrast. No mesenteric or retroperitoneal mass or adenopathy.  The bladder is distended and thick walled with large bladder diverticuli. The prostate gland and seminal vesicles are  unremarkable. No pelvic mass or adenopathy. No inguinal mass or adenopathy.  The bony structures are unremarkable.  Review of the MIP images confirms the above findings.  IMPRESSION: No CT findings for a thoracic aortic dissection or aneurysm. The pulmonary arteries are normal.  Borderline mediastinal and hilar lymph nodes but no mass.  No acute pulmonary findings.  Dependent atelectasis/edema.  Moderate scattered atherosclerotic calcifications involving the abdominal aorta but no aneurysm or dissection. More significant disease involving the iliac arteries with approximately 50% stenosis of the left common iliac artery.  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  Stable left hepatic lobe liver lesion and splenic lesion.   Electronically Signed   By: Kalman Jewels M.D.   On: 08/15/2014 02:37   Ct Cta Abd/pel W/cm &/or W/o Cm  08/15/2014   CLINICAL DATA:  Chest pain.  EXAM: CT ANGIOGRAPHY CHEST, ABDOMEN AND PELVIS  TECHNIQUE: Multidetector CT imaging through the chest, abdomen and pelvis was performed using the standard protocol during bolus administration of intravenous contrast. Multiplanar reconstructed images and MIPs were obtained and reviewed to evaluate the vascular anatomy.  CONTRAST:  132mL OMNIPAQUE IOHEXOL 350 MG/ML SOLN  COMPARISON:  None.  FINDINGS: CTA CHEST FINDINGS  The thoracic aorta is normal. No aneurysm or dissection. The pulmonary arteries are normal. No pulmonary emboli. The heart is normal in size. No pericardial effusion. No mediastinal or hilar mass or adenopathy. There are scattered borderline lymph nodes. Coronary artery calcifications are noted.  The lungs demonstrate dependent bibasilar atelectasis. No edema or effusions. No worrisome pulmonary lesions.  Review of the MIP images confirms the above findings.  CTA ABDOMEN AND PELVIS FINDINGS  The abdominal aorta demonstrates moderate atherosclerotic calcifications. The major branch vessels are patent. No aneurysm or  dissection. 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  Stable left hepatic lobe liver lesion since a chest CT of 2012. 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 The gallbladder is normal. No common bile duct dilatation. The pancreas is unremarkable. The spleen is normal in size. Stable enhancing splenic lesion is likely a hemangioma. The adrenal glands and kidneys are unremarkable.  The stomach, duodenum, small bowel and colon are grossly normal without oral contrast. No mesenteric or retroperitoneal mass or adenopathy.  The bladder is distended and thick walled with large bladder diverticuli. The prostate gland and seminal vesicles are unremarkable. No pelvic mass or adenopathy. No inguinal mass or adenopathy.  The  bony structures are unremarkable.  Review of the MIP images confirms the above findings.  IMPRESSION: No CT findings for a thoracic aortic dissection or aneurysm. The pulmonary arteries are normal.  Borderline mediastinal and hilar lymph nodes but no mass.  No acute pulmonary findings.  Dependent atelectasis/edema.  Moderate scattered atherosclerotic calcifications involving the abdominal aorta but no aneurysm or dissection. More significant disease involving the iliac arteries with approximately 50% stenosis of the left common iliac artery.  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  Stable left hepatic lobe liver lesion and splenic lesion.   Electronically Signed   By: Kalman Jewels M.D.   On: 08/15/2014 02:37     EKG Interpretation   Date/Time:  Tuesday August 15 2014 00:17:52 EDT Ventricular Rate:  98 PR Interval:  147 QRS Duration: 81 QT Interval:  343 QTC Calculation: 438 R Axis:   -15 Text Interpretation:  Sinus rhythm Probable left atrial enlargement  Borderline left axis deviation Probable anteroseptal infarct, old No  significant change was found Confirmed by Wyvonnia Dusky  MD, Stefen Juba [phone]) on   08/15/2014 12:33:06 AM      MDM   Final diagnoses:  Urinary tract infection without hematuria, site unspecified  Fever, unspecified fever cause  Sepsis, due to unspecified organism   Patient presents with generalized weakness. Found be febrile on arrival to the ED. Complaining of chest pain which is left-sided and reproducible to palpation.  Blood pressure stable. No tachycardia. Code sepsis called on arrival. Broad spectrum antibiotics given after cultures obtained.  Normal sinus rhythm on EKG. Patient's pain is left-sided to his chest and reproducible. POC troponin 0.15. Lab Troponin negative.  Chest x-ray negative for infiltrate. Urinalysis positive for infection.  Patient complaining of headache, neck pain with reduced range of motion. Meningitis is considered. Patient also has significant chest pain on the left side that appears to be musculoskeletal.  No evidence of pulmonary embolism or dissection on imaging. CT head negative.  Lumbar puncture attempted without success. Patient will be treated empirically for possible meningitis but vancomycin, ceftriaxone.  Troponin negative x2. Chest pain is appears to be musculoskeletal. CT scan did not show any evidence of pneumonia or other intrathoracic pathology.  Discussed with Dr. Hal Hope.   CRITICAL CARE Performed by: [name] Total critical care time: 60 Critical care time was exclusive of separately billable procedures and treating other patients. Critical care was necessary to treat or prevent imminent or life-threatening deterioration. Critical care was time spent personally by me on the following activities: development of treatment plan with patient and/or surrogate as well as nursing, discussions with consultants, evaluation of patient's response to treatment, examination of patient, obtaining history from patient or surrogate, ordering and performing treatments and interventions, ordering and review of  laboratory studies, ordering and review of radiographic studies, pulse oximetry and re-evaluation of patient's condition.   [name], MD 08/15/14 [phone]

## 2014-08-15 NOTE — Progress Notes (Addendum)
ANTIBIOTIC CONSULT NOTE - INITIAL  Pharmacy Consult for vancomycin and zosyn  Indication: rule out sepsis  No Known Allergies  Patient Measurements: Height: 5\' 9"  (175.3 cm) Weight: 160 lb (72.576 kg) IBW/kg (Calculated) : 70.7 Adjusted Body Weight:   Vital Signs: Temp: 104.4 F (40.2 C) (08/18 0020) Temp src: Rectal (08/18 0020) BP: 146/60 mmHg (08/18 0020) Pulse Rate: 98 (08/18 0020) Intake/Output from previous day:   Intake/Output from this shift:    Labs:  Recent Labs  08/15/14 0042  WBC 14.0*  HGB 13.2  PLT 209  CREATININE 0.88   Estimated Creatinine Clearance: 88.2 ml/min (by C-G formula based on Cr of 0.88). No results found for this basename: VANCOTROUGH, Corlis Leak, VANCORANDOM, [place], GENTPEAK, GENTRANDOM, TOBRATROUGH, TOBRAPEAK, TOBRARND, AMIKACINPEAK, AMIKACINTROU, AMIKACIN,  in the last 72 hours   Microbiology: Recent Results (from the past 720 hour(s))  RAPID STREP SCREEN     Status: None   Collection Time    08/15/14 12:50 AM      Result Value Ref Range Status   Streptococcus, Group A Screen (Direct) NEGATIVE  NEGATIVE Final   Comment: (NOTE)     A Rapid Antigen test may result negative if the antigen level in the     sample is below the detection level of this test. The FDA has not     cleared this test as a stand-alone test therefore the rapid antigen     negative result has reflexed to a Group A Strep culture.    Medical History: Past Medical History  Diagnosis Date  . Hypertension   . Diabetes mellitus   . Brain tumor   . CVA (cerebral vascular accident)   . Narcotic abuse   . CHF (congestive heart failure)     Medications:   (Not in a hospital admission) Assessment: 63 yo with generalized weakness fever 105 on arrival and chest pain. vanc and zosyn empirically for sepsis or pna coverage. 1st dose of vanc and zosyn at 0100  Goal of Therapy:  Vancomycin trough level 15-20 mcg/ml  Plan:   Continue vnacomycin 1gm q12h and  zosyn 3.375 gm q8h next  Doses at noon ( vanc) and 0600 zosyn  Curlene Dolphin 08/15/2014,1:45 AM  DX. Updated to r/o meningitis vs pituitary tumore as source of HA . vanc dose to remain the same with goal 15-61mcg/ml.         Zosyn dc'ed.           Add rocephin 2gm q12h          Add acyclovir 700 mg iv q8h   f/u LP being attempted under floroscope.   Curlene Dolphin

## 2014-08-15 NOTE — H&P (Addendum)
[facility] History and Physical  [name] WER:[id number] DOB: 08/10/1952 DOA: 08/15/2014  Referring physician: ER physician. PCP: Philis Fendt, MD   Chief Complaint: Fever headache and chest pain.  HPI: [name] is a 63 y.o. male with history of diabetes mellitus, hypertension pituitary tumor woke up from the sleep at midnight with complaints of severe headache neck pain chest pain and fever with nausea vomiting. Patient states he has chronic headache and also has history of pituitary tumor for which he is following up with Dr. Saintclair Halsted neurosurgeon. Headache is mostly frontal and was associated with some blurred vision and nausea vomiting. At the same time patient also had neck pain and chest pain. The chest pain was reproducible in the ER on the left anterior chest wall. Denies any associated shortness of breath. In the ER patient was found to have fever of 104F. Blood cultures were obtained. Lactic acid was mildly elevated. Patient's CT head did not show anything acute. Lumbar puncture was attempted but was unable to succeed by the ED physician. UA showing features consistent with UTI and patient was started on empiric antibiotics to cover for possible sepsis. On my exam patient presently is able to see things clearly and is nonfocal. Still has neck rigidity and headache. Denies any recent travel anywhere outside of any tic bites are insect bites. Denies any sick contacts. Denies having used any IV drugs recently. Has been using Percocet for chronic headaches. His fever has improved after coming to the ER and is present around 39F. Patient did have nausea vomiting but denies any abdominal pain at all last 2 days has been having some dysuria and diarrhea. Denies any penile discharge. Patient is feeling fatigued and tired. Chest pain is left-sided anterior chest wall stabbing-like.  Review of Systems: As presented in the history of presenting illness, rest negative.  Past Medical  History  Diagnosis Date  . Hypertension   . Diabetes mellitus   . Brain tumor   . CVA (cerebral vascular accident)   . Narcotic abuse   . CHF (congestive heart failure)    History reviewed. No pertinent past surgical history. Social History:  reports that he has been smoking.  He does not have any smokeless tobacco history on file. He reports that he drinks alcohol. He reports that he uses illicit drugs. Where does patient live home. Can patient participate in ADLs? Yes.  No Known Allergies  Family History:  Family History  Problem Relation Age of Onset  . CAD Father   . Diabetes Brother   . Diabetes Sister   . Hypertension Father   . Hypertension Mother       Prior to Admission medications   Medication Sig Start Date End Date Taking? Authorizing Provider  acetaminophen (TYLENOL) 500 MG tablet Take 500 mg by mouth every 6 (six) hours as needed for headache.   Yes Historical Provider, MD  amLODipine (NORVASC) 10 MG tablet Take 10 mg by mouth daily.   Yes Historical Provider, MD  aspirin EC 81 MG tablet Take 81 mg by mouth daily.   Yes Historical Provider, MD  b complex vitamins tablet Take 1 tablet by mouth daily.   Yes Historical Provider, MD  hydrALAZINE (APRESOLINE) 50 MG tablet Take 1 tablet (50 mg total) by mouth every 8 (eight) hours. 12/27/13  Yes Debbe Odea, MD  isosorbide dinitrate (ISORDIL) 10 MG tablet Take 1 tablet (10 mg total) by mouth 3 (three) times daily. 12/27/13  Yes Debbe Odea, MD  metFORMIN (GLUCOPHAGE) 500 MG tablet Take 500 mg by mouth 2 (two) times daily with a meal.   Yes Historical Provider, MD  oxyCODONE-acetaminophen (PERCOCET/ROXICET) 5-325 MG per tablet Take 1 tablet by mouth every 4 (four) hours as needed for severe pain.   Yes Historical Provider, MD  pravastatin (PRAVACHOL) 20 MG tablet Take 20 mg by mouth at bedtime.   Yes Historical Provider, MD  traMADol (ULTRAM) 50 MG tablet Take 50 mg by mouth every 6 (six) hours as needed for moderate  pain. 12/15/13  Yes Virgel Manifold, MD    Physical Exam: Filed Vitals:   08/15/14 0315 08/15/14 0330 08/15/14 0345 08/15/14 0415  BP: 121/49 107/62 119/53 121/55  Pulse: 78 105 77 71  Temp:      TempSrc:      Resp: 18 18 10 8   Height:      Weight:      SpO2: 95% 96% 97% 98%     General:  Well-developed well-nourished.  Eyes: Anicteric no pallor.  ENT: No discharge from the ears eyes nose mouth.  Neck: Neck rigidity present no obvious mass felt.  Cardiovascular: S1-S2 heard.  Respiratory: No rhonchi or crepitations.  Abdomen: Soft nontender bowel sounds present. No guarding or rigidity.  Skin: No obvious rash.  Musculoskeletal: No edema. Patient has pain on moving his legs.  Psychiatric: Appears normal.  Neurologic: Alert awake oriented to time place and person. Moves all extremities 5 x 5. No facial asymmetry. Tongue is midline. PERRLA positive.  Labs on Admission:  Basic Metabolic Panel:  Recent Labs Lab 08/15/14 0042  NA 139  K 3.5*  CL 100  CO2 22  GLUCOSE 112*  BUN 12  CREATININE 0.88  CALCIUM 9.6   Liver Function Tests:  Recent Labs Lab 08/15/14 0426  AST 32  ALT 28  ALKPHOS 76  BILITOT 0.7  PROT 6.6  ALBUMIN 2.9*    Recent Labs Lab 08/15/14 0426  LIPASE 19   No results found for this basename: AMMONIA,  in the last 168 hours CBC:  Recent Labs Lab 08/15/14 0042  WBC 14.0*  HGB 13.2  HCT 40.4  MCV 72.7*  PLT 209   Cardiac Enzymes:  Recent Labs Lab 08/15/14 0042 08/15/14 0426  TROPONINI <0.30 <0.30    BNP (last 3 results)  Recent Labs  12/10/13 0017 12/15/13 0605  PROBNP 51.9 72.5   CBG: No results found for this basename: GLUCAP,  in the last 168 hours  Radiological Exams on Admission: Ct Head Wo Contrast  08/15/2014   CLINICAL DATA:  Fever and headache  EXAM: CT HEAD WITHOUT CONTRAST  TECHNIQUE: Contiguous axial images were obtained from the base of the skull through the vertex without intravenous contrast.   COMPARISON:  07/01/2013  FINDINGS: Skull and Sinuses:No acute fracture or destructive process.  Mild inflammatory mucosal thickening in the bilateral ethmoid sinuses.  A periapical cyst associated with a maxillary incisor noted on 12/26/2013 brain MRI is not clearly seen on this examination.  Orbits: No acute abnormality.  Brain: No evidence of acute abnormality, such as acute infarction, hemorrhage, hydrocephalus, or mass lesion/mass effect.  The patient has a known pituitary adenoma which is not well resolved by CT. No gross indication of interval hemorrhage.  IMPRESSION: 1. No acute intracranial abnormality. 2. Chronic findings are discussed above.   Electronically Signed   By: Jorje Guild M.D.   On: 08/15/2014 02:33   Dg Chest Port 1 View  08/15/2014   CLINICAL DATA:  Fever and chest pain  EXAM: PORTABLE CHEST - 1 VIEW  COMPARISON:  05/15/2014  FINDINGS: No cardiomegaly for technique. Negative mediastinal contours. There is no edema, consolidation, effusion, or pneumothorax.  IMPRESSION: No active disease.   Electronically Signed   By: Jorje Guild M.D.   On: 08/15/2014 00:37   Ct Angio Chest Aortic Dissect W &/or W/o  08/15/2014   CLINICAL DATA:  Chest pain.  EXAM: CT ANGIOGRAPHY CHEST, ABDOMEN AND PELVIS  TECHNIQUE: Multidetector CT imaging through the chest, abdomen and pelvis was performed using the standard protocol during bolus administration of intravenous contrast. Multiplanar reconstructed images and MIPs were obtained and reviewed to evaluate the vascular anatomy.  CONTRAST:  163mL OMNIPAQUE IOHEXOL 350 MG/ML SOLN  COMPARISON:  None.  FINDINGS: CTA CHEST FINDINGS  The thoracic aorta is normal. No aneurysm or dissection. The pulmonary arteries are normal. No pulmonary emboli. The heart is normal in size. No pericardial effusion. No mediastinal or hilar mass or adenopathy. There are scattered borderline lymph nodes. Coronary artery calcifications are noted.  The lungs demonstrate dependent  bibasilar atelectasis. No edema or effusions. No worrisome pulmonary lesions.  Review of the MIP images confirms the above findings.  CTA ABDOMEN AND PELVIS FINDINGS  The abdominal aorta demonstrates moderate atherosclerotic calcifications. The major branch vessels are patent. No aneurysm or dissection. 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  Stable left hepatic lobe liver lesion since a chest CT of 2012. 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 The gallbladder is normal. No common bile duct dilatation. The pancreas is unremarkable. The spleen is normal in size. Stable enhancing splenic lesion is likely a hemangioma. The adrenal glands and kidneys are unremarkable.  The stomach, duodenum, small bowel and colon are grossly normal without oral contrast. No mesenteric or retroperitoneal mass or adenopathy.  The bladder is distended and thick walled with large bladder diverticuli. The prostate gland and seminal vesicles are unremarkable. No pelvic mass or adenopathy. No inguinal mass or adenopathy.  The bony structures are unremarkable.  Review of the MIP images confirms the above findings.  IMPRESSION: No CT findings for a thoracic aortic dissection or aneurysm. The pulmonary arteries are normal.  Borderline mediastinal and hilar lymph nodes but no mass.  No acute pulmonary findings.  Dependent atelectasis/edema.  Moderate scattered atherosclerotic calcifications involving the abdominal aorta but no aneurysm or dissection. More significant disease involving the iliac arteries with approximately 50% stenosis of the left common iliac artery.  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  Stable left hepatic lobe liver lesion and splenic lesion.   Electronically Signed   By: Kalman Jewels M.D.   On: 08/15/2014 02:37   Ct Cta Abd/pel W/cm &/or W/o Cm  08/15/2014   CLINICAL DATA:  Chest pain.  EXAM: CT ANGIOGRAPHY CHEST, ABDOMEN AND PELVIS   TECHNIQUE: Multidetector CT imaging through the chest, abdomen and pelvis was performed using the standard protocol during bolus administration of intravenous contrast. Multiplanar reconstructed images and MIPs were obtained and reviewed to evaluate the vascular anatomy.  CONTRAST:  149mL OMNIPAQUE IOHEXOL 350 MG/ML SOLN  COMPARISON:  None.  FINDINGS: CTA CHEST FINDINGS  The thoracic aorta is normal. No aneurysm or dissection. The pulmonary arteries are normal. No pulmonary emboli. The heart is normal in size. No pericardial effusion. No mediastinal or hilar mass or adenopathy. There are scattered borderline lymph nodes. Coronary artery calcifications are noted.  The lungs demonstrate dependent bibasilar atelectasis.  No edema or effusions. No worrisome pulmonary lesions.  Review of the MIP images confirms the above findings.  CTA ABDOMEN AND PELVIS FINDINGS  The abdominal aorta demonstrates moderate atherosclerotic calcifications. The major branch vessels are patent. No aneurysm or dissection. 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  Stable left hepatic lobe liver lesion since a chest CT of 2012. 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 The gallbladder is normal. No common bile duct dilatation. The pancreas is unremarkable. The spleen is normal in size. Stable enhancing splenic lesion is likely a hemangioma. The adrenal glands and kidneys are unremarkable.  The stomach, duodenum, small bowel and colon are grossly normal without oral contrast. No mesenteric or retroperitoneal mass or adenopathy.  The bladder is distended and thick walled with large bladder diverticuli. The prostate gland and seminal vesicles are unremarkable. No pelvic mass or adenopathy. No inguinal mass or adenopathy.  The bony structures are unremarkable.  Review of the MIP images confirms the above findings.  IMPRESSION: No CT findings for a thoracic aortic dissection or  aneurysm. The pulmonary arteries are normal.  Borderline mediastinal and hilar lymph nodes but no mass.  No acute pulmonary findings.  Dependent atelectasis/edema.  Moderate scattered atherosclerotic calcifications involving the abdominal aorta but no aneurysm or dissection. More significant disease involving the iliac arteries with approximately 50% stenosis of the left common iliac artery.  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  Stable left hepatic lobe liver lesion and splenic lesion.   Electronically Signed   By: Kalman Jewels M.D.   On: 08/15/2014 02:37    EKG: Independently reviewed. Normal sinus rhythm with poor R-wave progression and nonspecific ST-T changes.  Assessment/Plan Principal Problem:   Fever Active Problems:   Chest pain   Urinary tract infection   Headache   1. Severe headache with fever - features are concerning for meningitis but patient also has Pituitary tumor. Lumbar puncture was attempted but was unable to do it in the ED. Fluoroscopy guided lumbar has been ordered. Check CSF for cell count differential Gram stain HSV PCR glucose and protein. Blood cultures have been ordered. Urine cultures have been ordered. Patient has been empirically placed on vancomycin ceftriaxone and acyclovir meningitis dose for now. Since patient has pituitary mass I have ordered MRI brain. CT head did not show anything acute. Patient will then will be placed on neurochecks. Pain relief medications and hydration. Patient has features consistent with UTI and fever could be from UTI. Check HIV. Check TSH Cortisol. 2. Chest pain - appears atypical and reproducible. Cardiac markers point-of-care was positive but regular cardiac marker was negative. CT and around the chest and abdomen was negative for any dissection but did show thickened bladder wall with diverticuli. 3. UTI - see #1. 4. Pituitary tumor - see #1. 5. Hypertension - continue home medications. 6. Diabetes mellitus -  hold metformin due to lack acidosis and patient will be placed on sliding-scale coverage. 7. History of drug abuse - patient denies taking any IV drugs recently. Check urine drug screen.    Code Status: Full code.  Family Communication: None.  Disposition Plan: Admit inpatient to step down.    Ibn Stief N. [facility] Pager [phone].  If 7PM-7AM, please contact night-coverage [email or web address] Password [email or web address] 08/15/2014, 5:39 AM

## 2014-08-15 NOTE — ED Notes (Signed)
Dr. Wyvonnia Dusky notified of elevated CG-4 and troponin

## 2014-08-15 NOTE — Progress Notes (Signed)
Utilization review completed. Kaydie Petsch, RN, BSN. 

## 2014-08-15 NOTE — Progress Notes (Signed)
ANTIBIOTIC CONSULT NOTE - FOLLOW UP  Pharmacy Consult for Ampicillin/Vanc/Ceftriaxone/Acyclovir Indication: possible meningitis  No Known Allergies  Patient Measurements: Height: 5\' 9"  (175.3 cm) Weight: 161 lb 6 oz (73.2 kg) IBW/kg (Calculated) : 70.7  Vital Signs: Temp: 97 F (36.1 C) (08/18 1225) Temp src: Oral (08/18 1225) BP: 112/57 mmHg (08/18 1200) Pulse Rate: 67 (08/18 1225) Intake/Output from previous day: 08/17 0701 - 08/18 0700 In: 98.3 [I.V.:98.3] Out: -  Intake/Output from this shift: Total I/O In: 1028 [I.V.:550; IV Piggyback:478] Out: 750 [Urine:750]  Labs:  Recent Labs  08/15/14 0042 08/15/14 0751  WBC 14.0* 24.5*  HGB 13.2 12.5*  PLT 209 218  CREATININE 0.88 0.88   Estimated Creatinine Clearance: 88.2 ml/min (by C-G formula based on Cr of 0.88). No results found for this basename: VANCOTROUGH, Corlis Leak, VANCORANDOM, [place], GENTPEAK, GENTRANDOM, TOBRATROUGH, TOBRAPEAK, TOBRARND, AMIKACINPEAK, AMIKACINTROU, AMIKACIN,  in the last 72 hours   Microbiology: Recent Results (from the past 720 hour(s))  RAPID STREP SCREEN     Status: None   Collection Time    08/15/14 12:50 AM      Result Value Ref Range Status   Streptococcus, Group A Screen (Direct) NEGATIVE  NEGATIVE Final   Comment: (NOTE)     A Rapid Antigen test may result negative if the antigen level in the     sample is below the detection level of this test. The FDA has not     cleared this test as a stand-alone test therefore the rapid antigen     negative result has reflexed to a Group A Strep culture.  MRSA PCR SCREENING     Status: None   Collection Time    08/15/14  7:31 AM      Result Value Ref Range Status   MRSA by PCR NEGATIVE  NEGATIVE Final   Comment:            The GeneXpert MRSA Assay (FDA     approved for NASAL specimens     only), is one component of a     comprehensive MRSA colonization     surveillance program. It is not     intended to diagnose MRSA   infection nor to guide or     monitor treatment for     MRSA infections.  CLOSTRIDIUM DIFFICILE BY PCR     Status: None   Collection Time    08/15/14 10:56 AM      Result Value Ref Range Status   C difficile by pcr NEGATIVE  NEGATIVE Final    Anti-infectives   Start     Dose/Rate Route Frequency Ordered Stop   08/15/14 2124  vancomycin (VANCOCIN) IVPB 750 mg/150 ml premix     750 mg 150 mL/hr over 60 Minutes Intravenous Every 8 hours 08/15/14 1428     08/15/14 1600  ampicillin (OMNIPEN) 2 g in sodium chloride 0.9 % 50 mL IVPB     2 g 150 mL/hr over 20 Minutes Intravenous 6 times per day 08/15/14 1441     08/15/14 1200  vancomycin (VANCOCIN) IVPB 1000 mg/200 mL premix  Status:  Discontinued     1,000 mg 200 mL/hr over 60 Minutes Intravenous Every 12 hours 08/15/14 0148 08/15/14 1428   08/15/14 0800  cefTRIAXone (ROCEPHIN) 2 g in dextrose 5 % 50 mL IVPB     2 g 100 mL/hr over 30 Minutes Intravenous Every 12 hours 08/15/14 0730     08/15/14 0745  acyclovir (ZOVIRAX) 700 mg in  dextrose 5 % 100 mL IVPB     700 mg 114 mL/hr over 60 Minutes Intravenous 3 times per day 08/15/14 0730     08/15/14 0600  piperacillin-tazobactam (ZOSYN) IVPB 3.375 g  Status:  Discontinued     3.375 g 12.5 mL/hr over 240 Minutes Intravenous 3 times per day 08/15/14 0148 08/15/14 0724   08/15/14 0545  cefTRIAXone (ROCEPHIN) 2 g in dextrose 5 % 50 mL IVPB     2 g 100 mL/hr over 30 Minutes Intravenous  Once 08/15/14 0537 08/15/14 0639   08/15/14 0030  piperacillin-tazobactam (ZOSYN) IVPB 3.375 g     3.375 g 100 mL/hr over 30 Minutes Intravenous  Once 08/15/14 0029 08/15/14 0132   08/15/14 0030  vancomycin (VANCOCIN) IVPB 1000 mg/200 mL premix     1,000 mg 200 mL/hr over 60 Minutes Intravenous  Once 08/15/14 0029 08/15/14 0216      Assessment: 63 yo M with generalized weakness, fever, headache, neck and chest pain. Pharmacy originally consulted to dose vanc/zosyn for r/o sepsis. Clinical picture suggestive  of meningitis and zosyn has since been dc'd while ceftriaxone and acyclovir have been added. Pharmacy has now been consulted to begin ampicillin for Listeria coverage. Awaiting LP procedure. WBC 24.5 (trending up), currently afebrile, Tmax 104.4 on admission. Scr 0.88, CrCl ~88.  Goal of Therapy:  Eradication of infection  Plan:  - Increase Vanc 750mg  q8h - Begin ampicillin 2g q4h - Continue acyclovir 700mg  IV q8h - Continue ceftriaxone 2g IV q12h - Monitor clinical progress, LP cultures, and renal function  Thank you for allowing pharmacy to be part of this patient's care team  [place], Pharm.D Clinical Pharmacy Resident Pager: [phone] 08/15/2014 .3:04 PM

## 2014-08-15 NOTE — Progress Notes (Signed)
INITIAL NUTRITION ASSESSMENT  DOCUMENTATION CODES Per approved criteria  -Not Applicable   INTERVENTION: Ensure Complete po BID, each supplement provides 350 kcal and 13 grams of protein  NUTRITION DIAGNOSIS: Inadequate oral intake related to chest pain, headache, and n/v as evidenced by reported intake less than estimated needs and weight loss.   Goal: Pt to meet >/= 90% of their estimated nutrition needs   Monitor:  Weight trend, po intake, acceptance of supplements, labs  Reason for Assessment: MST  63 y.o. male  Admitting Dx: Fever  ASSESSMENT: 63 y.o. male with history of diabetes mellitus, hypertension pituitary tumor woke up from the sleep at midnight with complaints of severe headache neck pain chest pain and fever with nausea vomiting.   - Pt reports that he had a poor appetite prior to admission. He reports weight loss of unknown amount over the past several months.  - Pt said that he had breakfast this morning and he has felt hungry today.  - Pt with moderate fat and muscle wasting of the clavicle and temples.   Labs: Na and K WNL CBGs: 100-159  Height: Ht Readings from Last 1 Encounters:  08/15/14 5\' 9"  (1.753 m)    Weight: Wt Readings from Last 1 Encounters:  08/15/14 161 lb 6 oz (73.2 kg)    Ideal Body Weight: 70.7 kg  % Ideal Body Weight: 104%  Wt Readings from Last 10 Encounters:  08/15/14 161 lb 6 oz (73.2 kg)  06/16/14 160 lb (72.576 kg)  05/15/14 160 lb (72.576 kg)  12/26/13 159 lb 13.3 oz (72.5 kg)  12/15/13 160 lb 8 oz (72.802 kg)  12/10/13 161 lb 6.4 oz (73.211 kg)  10/17/13 168 lb 6.4 oz (76.386 kg)  09/06/12 166 lb 10.7 oz (75.6 kg)  05/29/12 170 lb (77.111 kg)    Usual Body Weight: 175 lbs per pt report  % Usual Body Weight: 92%  BMI:  Body mass index is 23.82 kg/(m^2).  Estimated Nutritional Needs: Kcal: 1850-2000 Protein: 95-110 g Fluid: 2.0 L/day  Skin: intact  Diet Order:    EDUCATION NEEDS: -Education needs  addressed   Intake/Output Summary (Last 24 hours) at 08/15/14 1113 Last data filed at 08/15/14 1000  Gross per 24 hour  Intake 512.33 ml  Output    500 ml  Net  12.33 ml    Last BM: prior to admission   Labs:   Recent Labs Lab 08/15/14 0042 08/15/14 0751  NA 139 140  K 3.5* 3.9  CL 100 104  CO2 22 25  BUN 12 11  CREATININE 0.88 0.88  CALCIUM 9.6 9.0  GLUCOSE 112* 147*    CBG (last 3)   Recent Labs  08/15/14 0735  GLUCAP 159*    Scheduled Meds: . acyclovir  700 mg Intravenous 3 times per day  . amLODipine  10 mg Oral Daily  . cefTRIAXone (ROCEPHIN)  IV  2 g Intravenous Q12H  . hydrALAZINE  50 mg Oral 3 times per day  . insulin aspart  0-9 Units Subcutaneous TID WC  . isosorbide dinitrate  10 mg Oral TID  . simvastatin  10 mg Oral q1800  . vancomycin  1,000 mg Intravenous Q12H    Continuous Infusions: . sodium chloride 100 mL/hr at 08/15/14 0730    Past Medical History  Diagnosis Date  . Hypertension   . Diabetes mellitus   . Brain tumor   . CVA (cerebral vascular accident)   . Narcotic abuse   . CHF (congestive  heart failure)     History reviewed. No pertinent past surgical history.  Terrace Arabia RD, LDN

## 2014-08-15 NOTE — ED Notes (Signed)
Pt c/o central cp that woke him out of his sleep that radiates to left arm. Pt describes pain as throbbing, pulsating. Pain is reproducible. Pt also reports feeling weak all day today, nausea and no vomiting. Pt was given 4mg  zofran en route. Pt warm to touch and shivering. Per EMS pt has temp of 104.4 and had 1 episode of diarrhea prior to coming. Pt alert and oriented.

## 2014-08-16 ENCOUNTER — Inpatient Hospital Stay (HOSPITAL_COMMUNITY): Payer: Medicaid Other

## 2014-08-16 DIAGNOSIS — IMO0001 Reserved for inherently not codable concepts without codable children: Secondary | ICD-10-CM

## 2014-08-16 DIAGNOSIS — H53149 Visual discomfort, unspecified: Secondary | ICD-10-CM

## 2014-08-16 DIAGNOSIS — R112 Nausea with vomiting, unspecified: Secondary | ICD-10-CM

## 2014-08-16 DIAGNOSIS — R509 Fever, unspecified: Secondary | ICD-10-CM

## 2014-08-16 DIAGNOSIS — R51 Headache: Secondary | ICD-10-CM

## 2014-08-16 DIAGNOSIS — E1165 Type 2 diabetes mellitus with hyperglycemia: Secondary | ICD-10-CM

## 2014-08-16 LAB — LIPID PANEL
Cholesterol: 128 mg/dL (ref 0–200)
HDL: 30 mg/dL — ABNORMAL LOW (ref >39)
LDL Cholesterol: 79 mg/dL (ref 0–99)
TRIGLYCERIDES: 94 mg/dL (ref <150)
Total CHOL/HDL Ratio: 4.3 RATIO
VLDL: 19 mg/dL (ref 0–40)

## 2014-08-16 LAB — GLUCOSE, CAPILLARY
Glucose-Capillary: 181 mg/dL — ABNORMAL HIGH (ref 70–99)
Glucose-Capillary: 184 mg/dL — ABNORMAL HIGH (ref 70–99)
Glucose-Capillary: 131 mg/dL — ABNORMAL HIGH (ref 70–99)
Glucose-Capillary: 128 mg/dL — ABNORMAL HIGH (ref 70–99)

## 2014-08-16 MED ORDER — MORPHINE SULFATE 4 MG/ML IJ SOLN
INTRAMUSCULAR | Status: AC
  Filled 2014-08-16: qty 1

## 2014-08-16 NOTE — Progress Notes (Signed)
Received report from Anderson Malta, RN to monitor patient in radiology during lumbar puncture. Pt not tolerating procedure well, moaning and tensing muscles.  Dr. Molli Barrows asked if pt had meds to help w/ pain.  Called and s/w Anderson Malta on floor.  Pt given Morphine as ordered prn for pain to continue w/ procedure. 2mg  IV morphine given.

## 2014-08-16 NOTE — Progress Notes (Signed)
TRH Progress Note  [name] GQQ:[id number] DOB: 08/10/1952 DOA: 08/15/2014 PCP: Philis Fendt, MD  Admit HPI / Brief Narrative: [name] is a 63 y.o. BM PMHx diabetes mellitus, hypertension,pituitary tumor whowoke up from the sleep at midnight with complaints of severe headache neck pain chest pain and fever with nausea vomiting. Patient states he has chronic headache and also has history of pituitary tumor followed by Dr Saintclair Halsted . Headache is mostly frontal and was associated with some blurred vision and nausea vomiting. At the same time patient also had neck pain and chest pain. The chest pain was reproducible in the ER on the left anterior chest wall. Denies any associated shortness of breath. In the ER patient was found to have fever of 104F. Blood cultures were obtained. Lactic acid was mildly elevated. Patient's CT head did not show anything acute. Lumbar puncture was attempted but was unable to succeed by the ED physician. UA showing features consistent with UTI and patient was started on empiric antibiotics to cover for possible sepsis.  Still has neck rigidity and headache. Denies any recent travel anywhere outside of any tic bites are insect bites. Denies any sick contacts. Denies having used any IV drugs recently. Has been using Percocet for chronic headaches. Patient did have nausea vomiting but denies any abdominal pain at all last 2 days has been having some dysuria and diarrhea. Denies any penile discharge. Patient is feeling fatigued and tired. Chest pain was left-sided anterior chest wall stabbing-like.   HPI/Subjective: States still with headache, +nausea, no vomiting. Went for LP per IR this am but it was again unsuccessful  Assessment/Plan: Severe headache with fever -Patient with history of headaches from his pituitary gland tumor however per patient these headaches much more severe and accompanied by other neurologic signs photophobia, stiff neck, fever, chills,  Rigors. -Failed attempted LP in the ED patient scheduled for LP by IR (Fluoroscopy guided lumbar) - Urine cultures with no growth to date, C. difficile negative -Neurosurgery has been consulted via phone per Dr Sherral Hammers MRI showed stable pituitary lesion compatible with adenoma since 2012, no further recommendations per NS  -UDS; positive for opiates (note patient is on home opioids) -continue pain management -Have consulted ID for further recommendations, Continue current empiric antibiotics for now -Will transfer to telemetry>> he has remained medically stable Chest pain  - Resolved  -Troponin x3 negative  -ChestCT and abdominal CT ; negative for any dissection but did show thickened bladder wall with diverticuli. See results below  UTI  -Current antibiotic regimen would cover a bacteria typical for UTI  Pituitary tumor  - see #1.  Hypertension  - Amlodipine 10 mg continue home medications. -Hydralazine 50 mg TID -Isordil 10 mg TID  Diabetes mellitus  - holding metformin for now -Continue sensitive SSI History of drug abuse - patient denies taking any IV drugs recently. Check urine drug screen. - A1c is 6.7, HDL is 30 pleural cholesterol is 128 and LDL is 79   Code Status: FULL Family Communication: no family present at time of exam Disposition Plan: ??    Consultants: Spoke with Dr. Jovita Gamma (neurosurgery); okayed LP  Procedure/Significant Events: 8/18 CT head without contrast; Brain: No evidence of acute abnormality, such as acute infarction,  hemorrhage, hydrocephalus, or mass lesion/mass effect.  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 8/18 CT abdomen pelvis with and without contrast:No CT findings for a thoracic aortic dissection or aneurysm. pulmonary  arteries are normal.-Borderline mediastinal and hilar lymph nodes but no mass. 8/18 CT angiogram chest with and without contrast;No aneurysm or  dissection/PE .-Stable left hepatic lobe liver lesion since a chest CT of 2012. Likely a hemangioma.  8/18 MRI brain pending 8/18 LP by IR pending    Culture 8/18 blood x2 pending 8/18 group A strep pending 8/18 urine pending 8/18 or so by PCR negative 8/18 C. difficile pending    Antibiotics: Ceftriaxone 8/18 Acyclovir 8/18 Vancomycin 8/18 Ampicillin 8/18   DVT prophylaxis: SCD   Devices NA  LINES / TUBES:  8/18 20ga right arm 8/18 20 ga left forearm    Continuous Infusions:    Objective: VITAL SIGNS: Temp: 98.4 F (36.9 C) (08/19 0744) Temp src: Oral (08/19 0744) BP: 120/56 mmHg (08/19 0600) Pulse Rate: 61 (08/19 0400) SPO2; FIO2:   Intake/Output Summary (Last 24 hours) at 08/16/14 0623 Last data filed at 08/16/14 0457  Gross per 24 hour  Intake 3097.33 ml  Output    900 ml  Net 2197.33 ml     Exam: General: A./O. x4, No acute respiratory distress Lungs: Clear to auscultation bilaterally without wheezes or crackles Cardiovascular: Regular rate and rhythm without murmur gallop or rub normal S1 and S2 Neck:  nuchal rigidity present Abdomen: Nontender, nondistended, soft, bowel sounds positive, no rebound, no ascites, no appreciable mass Extremities: No significant cyanosis, clubbing, or edema bilateral lower extremities Neurologic; cranial nerves II through XII intact, tongue/uvula midline, all extremity strength 5/5, sensation intact throughout,  Data Reviewed: Basic Metabolic Panel:  Recent Labs Lab 08/15/14 0042 08/15/14 0751  NA 139 140  K 3.5* 3.9  CL 100 104  CO2 22 25  GLUCOSE 112* 147*  BUN 12 11  CREATININE 0.88 0.88  CALCIUM 9.6 9.0   Liver Function Tests:  Recent Labs Lab 08/15/14 0426 08/15/14 0751  AST 32 30  ALT 28 29  ALKPHOS 76 63  BILITOT 0.7 0.5  PROT 6.6 6.8  ALBUMIN 2.9* 2.9*    Recent Labs Lab 08/15/14 0426  LIPASE 19   No results found for this basename: AMMONIA,  in the last 168  hours CBC:  Recent Labs Lab 08/15/14 0042 08/15/14 0751  WBC 14.0* 24.5*  NEUTROABS  --  20.8*  HGB 13.2 12.5*  HCT 40.4 38.7*  MCV 72.7* 73.4*  PLT 209 218   Cardiac Enzymes:  Recent Labs Lab 08/15/14 0042 08/15/14 0426 08/15/14 0955 08/15/14 1525 08/15/14 1836  TROPONINI <0.30 <0.30 <0.30 <0.30 <0.30   BNP (last 3 results)  Recent Labs  12/10/13 0017 12/15/13 0605  PROBNP 51.9 72.5   CBG:  Recent Labs Lab 08/15/14 0735 08/15/14 1241 08/15/14 2217 08/16/14 0743  GLUCAP 159* 97 119* 181*    Recent Results (from the past 240 hour(s))  CULTURE, BLOOD (ROUTINE X 2)     Status: None   Collection Time    08/15/14 12:42 AM      Result Value Ref Range Status   Specimen Description BLOOD LEFT HAND   Final   Special Requests BOTTLES DRAWN AEROBIC ONLY 10CC   Final   Culture  Setup Time     Final   Value: 08/15/2014 10:30     Performed at [facility]   Culture     Final   Value:        BLOOD CULTURE RECEIVED NO GROWTH TO DATE CULTURE WILL BE HELD FOR 5 DAYS BEFORE ISSUING A FINAL NEGATIVE REPORT  Performed at [facility]   Report Status PENDING   Incomplete  CULTURE, BLOOD (ROUTINE X 2)     Status: None   Collection Time    08/15/14 12:42 AM      Result Value Ref Range Status   Specimen Description BLOOD RIGHT UPPER ARM   Final   Special Requests BOTTLES DRAWN AEROBIC AND ANAEROBIC 10CC EA   Final   Culture  Setup Time     Final   Value: 08/15/2014 10:30     Performed at [facility]   Culture     Final   Value:        BLOOD CULTURE RECEIVED NO GROWTH TO DATE CULTURE WILL BE HELD FOR 5 DAYS BEFORE ISSUING A FINAL NEGATIVE REPORT     Performed at [facility]   Report Status PENDING   Incomplete  RAPID STREP SCREEN     Status: None   Collection Time    08/15/14 12:50 AM      Result Value Ref Range Status   Streptococcus, Group A Screen (Direct) NEGATIVE  NEGATIVE Final   Comment: (NOTE)     A Rapid Antigen test may  result negative if the antigen level in the     sample is below the detection level of this test. The FDA has not     cleared this test as a stand-alone test therefore the rapid antigen     negative result has reflexed to a Group A Strep culture.  URINE CULTURE     Status: None   Collection Time    08/15/14  2:29 AM      Result Value Ref Range Status   Specimen Description URINE, RANDOM   Final   Special Requests NONE   Final   Culture  Setup Time     Final   Value: 08/15/2014 02:46     Performed at [place]     Final   Value: NO GROWTH     Performed at [facility]   Culture     Final   Value: NO GROWTH     Performed at [facility]   Report Status 08/16/2014 FINAL   Final  MRSA PCR SCREENING     Status: None   Collection Time    08/15/14  7:31 AM      Result Value Ref Range Status   MRSA by PCR NEGATIVE  NEGATIVE Final   Comment:            The GeneXpert MRSA Assay (FDA     approved for NASAL specimens     only), is one component of a     comprehensive MRSA colonization     surveillance program. It is not     intended to diagnose MRSA     infection nor to guide or     monitor treatment for     MRSA infections.  CLOSTRIDIUM DIFFICILE BY PCR     Status: None   Collection Time    08/15/14 10:56 AM      Result Value Ref Range Status   C difficile by pcr NEGATIVE  NEGATIVE Final     Studies:  Recent x-ray studies have been reviewed in detail by the Attending Physician  Scheduled Meds:  Scheduled Meds: . acyclovir  700 mg Intravenous 3 times per day  . amLODipine  10 mg Oral Daily  . ampicillin (OMNIPEN) IV  2 g Intravenous  6 times per day  . cefTRIAXone (ROCEPHIN)  IV  2 g Intravenous Q12H  . feeding supplement (ENSURE COMPLETE)  237 mL Oral BID BM  . hydrALAZINE  50 mg Oral 3 times per day  . insulin aspart  0-9 Units Subcutaneous TID WC  . isosorbide dinitrate  10 mg Oral TID  . simvastatin  10 mg Oral q1800  .  vancomycin  750 mg Intravenous Q8H    Time spent on care of this patient: 35 mins   Sheila Oats , MD   [facility] Office  [phone] Pager - [phone]  On-Call/Text Page:      [email or web address]      password [email or web address]  If 7PM-7AM, please contact night-coverage [email or web address] Password [email or web address] 08/16/2014, 9:24 AM   LOS: 1 day

## 2014-08-16 NOTE — Progress Notes (Signed)
Pt transferred to unit via wheelchair. VS obtained. Pt placed on tele. Pt stable upon transfer to unit with no further questions at this time. Pt oriented to room, call bell within reach.

## 2014-08-16 NOTE — Consult Note (Signed)
[facility]     Reason for Consult: possible meningitis    Referring Physician: Dr. Dillard Essex  Principal Problem:   Fever Active Problems:   Chest pain   Urinary tract infection   Headache   . acyclovir  700 mg Intravenous 3 times per day  . amLODipine  10 mg Oral Daily  . ampicillin (OMNIPEN) IV  2 g Intravenous 6 times per day  . cefTRIAXone (ROCEPHIN)  IV  2 g Intravenous Q12H  . feeding supplement (ENSURE COMPLETE)  237 mL Oral BID BM  . hydrALAZINE  50 mg Oral 3 times per day  . insulin aspart  0-9 Units Subcutaneous TID WC  . isosorbide dinitrate  10 mg Oral TID  . simvastatin  10 mg Oral q1800  . vancomycin  750 mg Intravenous Q8H    Recommendations: Continue with vancomycin and ceftriaxone  Patient is willing to pursue LP if given more sedation Though CSF may not grow culture at this point, if there were no WBCs, I would consider this ruled out Will d/c acyclovir Will d/c ampicillin I will d/c isolation  Assessment: He has n/v, headache, photophobia and fever concerning for meningitis.  Did not have any previous urinary complaints prior to admission. He has not had confusion and MRI does not show any encephalitis.  No petechiae or concerns for meningicoccal.  Blood cultures ngtd.    Antibiotics: Vancomcycin, ceftriaxone, acyclovir, ampicillin  HPI: [name] is a 63 y.o. male with history of chronic pain on oxycodone chronically by Dr. Luretha Rued who came in by EMS overnight of 8/17-18 with headache that woke him up from sleep, n/v, fever to 104 by EMS, photophobia.  Also with neck pain.  Had some chest pain that is not currently active.  Some blurry vision.  Has a pituitary tumor, no history of any neurosurgical procedures.  No confusion.  He was started on empiric antibiotics for meningitis and encephalitis and has been afebrile since admission. LP was attempted but the patient was unable to tolerate.     Review of Systems: A  comprehensive review of systems was negative.  Past Medical History  Diagnosis Date  . Hypertension   . Diabetes mellitus   . Brain tumor   . CVA (cerebral vascular accident)   . Narcotic abuse   . CHF (congestive heart failure)     History  Substance Use Topics  . Smoking status: Current Every Day Smoker  . Smokeless tobacco: Not on file  . Alcohol Use: Yes     Comment: 6 pack a week    Family History  Problem Relation Age of Onset  . CAD Father   . Diabetes Brother   . Diabetes Sister   . Hypertension Father   . Hypertension Mother    No Known Allergies  OBJECTIVE: Blood pressure 110/38, pulse 70, temperature 98.4 F (36.9 C), temperature source Oral, resp. rate 14, height 5\' 9"  (1.753 m), weight 161 lb 6 oz (73.2 kg), SpO2 97.00%. General: awake, alert, nad Skin: no rashes, some dry skin on right hand Lungs: CTA B Cor: RRR without m Abdomen: soft, nt, nd Ext: no edema Neuro: nuchal rigidity  Microbiology: Recent Results (from the past 240 hour(s))  CULTURE, BLOOD (ROUTINE X 2)     Status: None   Collection Time    08/15/14 12:42 AM      Result Value Ref Range Status   Specimen Description BLOOD LEFT HAND   Final  Special Requests BOTTLES DRAWN AEROBIC ONLY 10CC   Final   Culture  Setup Time     Final   Value: 08/15/2014 10:30     Performed at [facility]   Culture     Final   Value:        BLOOD CULTURE RECEIVED NO GROWTH TO DATE CULTURE WILL BE HELD FOR 5 DAYS BEFORE ISSUING A FINAL NEGATIVE REPORT     Performed at [facility]   Report Status PENDING   Incomplete  CULTURE, BLOOD (ROUTINE X 2)     Status: None   Collection Time    08/15/14 12:42 AM      Result Value Ref Range Status   Specimen Description BLOOD RIGHT UPPER ARM   Final   Special Requests BOTTLES DRAWN AEROBIC AND ANAEROBIC 10CC EA   Final   Culture  Setup Time     Final   Value: 08/15/2014 10:30     Performed at [facility]   Culture     Final   Value:         BLOOD CULTURE RECEIVED NO GROWTH TO DATE CULTURE WILL BE HELD FOR 5 DAYS BEFORE ISSUING A FINAL NEGATIVE REPORT     Performed at [facility]   Report Status PENDING   Incomplete  RAPID STREP SCREEN     Status: None   Collection Time    08/15/14 12:50 AM      Result Value Ref Range Status   Streptococcus, Group A Screen (Direct) NEGATIVE  NEGATIVE Final   Comment: (NOTE)     A Rapid Antigen test may result negative if the antigen level in the     sample is below the detection level of this test. The FDA has not     cleared this test as a stand-alone test therefore the rapid antigen     negative result has reflexed to a Group A Strep culture.  URINE CULTURE     Status: None   Collection Time    08/15/14  2:29 AM      Result Value Ref Range Status   Specimen Description URINE, RANDOM   Final   Special Requests NONE   Final   Culture  Setup Time     Final   Value: 08/15/2014 02:46     Performed at [place] [facility]     Final   Value: NO GROWTH     Performed at [facility]   Culture     Final   Value: NO GROWTH     Performed at [facility]   Report Status 08/16/2014 FINAL   Final  MRSA PCR SCREENING     Status: None   Collection Time    08/15/14  7:31 AM      Result Value Ref Range Status   MRSA by PCR NEGATIVE  NEGATIVE Final   Comment:            The GeneXpert MRSA Assay (FDA     approved for NASAL specimens     only), is one component of a     comprehensive MRSA colonization     surveillance program. It is not     intended to diagnose MRSA     infection nor to guide or     monitor treatment for     MRSA infections.  CLOSTRIDIUM DIFFICILE BY PCR     Status: None   Collection Time  08/15/14 10:56 AM      Result Value Ref Range Status   C difficile by pcr NEGATIVE  NEGATIVE Final    Scharlene Gloss, [facility] [email or web address] [phone] pager  [phone]  cell 08/16/2014, 11:13 AM

## 2014-08-17 DIAGNOSIS — R291 Meningismus: Secondary | ICD-10-CM

## 2014-08-17 DIAGNOSIS — G8929 Other chronic pain: Secondary | ICD-10-CM

## 2014-08-17 LAB — GLUCOSE, CAPILLARY
GLUCOSE-CAPILLARY: 179 mg/dL — AB (ref 70–99)
Glucose-Capillary: 135 mg/dL — ABNORMAL HIGH (ref 70–99)

## 2014-08-17 NOTE — Progress Notes (Signed)
TRH Progress Note  [name] ZOX:[id number] DOB: 08/10/1952 DOA: 08/15/2014 PCP: Samuel Fendt, MD  Admit HPI / Brief Narrative: [name] is a 63 y.o. BM PMHx diabetes mellitus, hypertension,pituitary tumor whowoke up from the sleep at midnight with complaints of severe headache neck pain chest pain and fever with nausea vomiting. Patient states he has chronic headache and also has history of pituitary tumor followed by Samuel [name] . Headache is mostly frontal and was associated with some blurred vision and nausea vomiting. At the same time patient also had neck pain and chest pain. The chest pain was reproducible in the ER on the left anterior chest wall. Denies any associated shortness of breath. In the ER patient was found to have fever of 104F. Blood cultures were obtained. Lactic acid was mildly elevated. Patient's CT head did not show anything acute. Lumbar puncture was attempted but was unable to succeed by the ED physician. UA showing features consistent with UTI and patient was started on empiric antibiotics to cover for possible sepsis.  Still has neck rigidity and headache. Denies any recent travel anywhere outside of any tic bites are insect bites. Denies any sick contacts. Denies having used any IV drugs recently. Has been using Percocet for chronic headaches. Patient did have nausea vomiting but denies any abdominal pain at all last 2 days has been having some dysuria and diarrhea. Denies any penile discharge. Patient is feeling fatigued and tired. Chest pain was left-sided anterior chest wall stabbing-like.   HPI/Subjective: States feeling better, headache less.back to baseline.  Assessment/Plan: Severe headache with fever -Patient with history of headaches from his pituitary gland tumor however per patient these headaches much more severe and accompanied by other neurologic signs photophobia, stiff neck, fever, chills, Rigors. -Failed attempted LPx2 in the ED and perIR  (Fluoroscopy guided lumbar) on [name] - Urine cultures with no growth to date, C. difficile negative -Neurosurgery was been consulted via phone per [name] MRI showed stable pituitary lesion compatible with adenoma since 2012, no further recommendations per NS  -UDS; positive for opiates (note patient is on home opioids) -continue pain management - ID consulted on [name] for further recommendations, and abx narrowed to Vanc and Rocephin, apparently pt was agreeable to LP to [name] , but on follow up to order LP per IR with more sedation he staates that he does not want any further LP attempts at all even with sedation -clinically improving, will Continue current empiric antibiotics for now.  [name] to follow up for further recs  Chest pain  - Resolved  -Troponin x3 negative  -Chest CT and abdominal CT ; negative for any dissection but did show thickened bladder wall with diverticuli. See results below  ?UTI  -urine culture with NGTD  Pituitary tumor  - see #1.  Hypertension  - Amlodipine 10 mg continue home medications. -Hydralazine 50 mg TID -Isordil 10 mg TID  Diabetes mellitus  - holding metformin for now -Continue sensitive SSI History of drug abuse - patient denies taking any IV drugs recently. Check urine drug screen. - A1c is 6.7, HDL is 30 pleural cholesterol is 128 and LDL is 79   Code Status: FULL Family Communication: no family present at bedside Disposition Plan: to home when medically ready    Consultants: Spoke with Samuel. Jovita [name] (neurosurgery); okayed LP  Procedure/Significant Events: 8/18 CT head without contrast; Brain: No evidence of acute abnormality, such as acute infarction,  hemorrhage, hydrocephalus, or  mass lesion/mass effect.  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 8/18 CT abdomen pelvis with and without contrast:No CT findings for a thoracic aortic dissection or aneurysm.  pulmonary arteries are normal.-Borderline mediastinal and hilar lymph nodes but no mass. 8/18 CT angiogram chest with and without contrast;No aneurysm or dissection/PE .-Stable left hepatic lobe liver lesion since a chest CT of 2012. Likely a hemangioma.  8/18 MRI brain pending 8/18 LP by IR pending    Culture 8/18 blood x2 pending 8/18 group A strep pending 8/18 urine pending 8/18 or so by PCR negative 8/18 C. difficile pending    Antibiotics: Ceftriaxone 8/18 Acyclovir 8/18 Vancomycin 8/18 Ampicillin 8/18   DVT prophylaxis: SCD   Devices NA  LINES / TUBES:  8/18 20ga right arm 8/18 20 ga left forearm    Continuous Infusions:    Objective: VITAL SIGNS: Temp: 98.4 F (36.9 C) (08/20 5284) Temp src: Oral (08/20 0614) BP: 138/62 mmHg (08/20 0939) Pulse Rate: 54 (08/20 0614) SPO2; FIO2:   Intake/Output Summary (Last 24 hours) at 08/17/14 1036 Last data filed at 08/16/14 2202  Gross per 24 hour  Intake    350 ml  Output      0 ml  Net    350 ml     Exam: General: A./O. x4, No acute respiratory distress Lungs: Clear to auscultation bilaterally without wheezes or crackles Cardiovascular: Regular rate and rhythm without murmur gallop or rub normal S1 and S2 Neck:  supple Abdomen: Nontender, nondistended, soft, bowel sounds positive, no rebound, no ascites, no appreciable mass Extremities: No significant cyanosis, clubbing, or edema bilateral lower extremities Neurologic; cranial nerves II through XII intact, tongue/uvula midline, all extremity strength 5/5, sensation intact throughout,  Data Reviewed: Basic Metabolic Panel:  Recent Labs Lab 08/15/14 0042 08/15/14 0751  NA 139 140  K 3.5* 3.9  CL 100 104  CO2 22 25  GLUCOSE 112* 147*  BUN 12 11  CREATININE 0.88 0.88  CALCIUM 9.6 9.0   Liver Function Tests:  Recent Labs Lab 08/15/14 0426 08/15/14 0751  AST 32 30  ALT 28 29  ALKPHOS 76 63  BILITOT 0.7 0.5  PROT 6.6 6.8  ALBUMIN 2.9*  2.9*    Recent Labs Lab 08/15/14 0426  LIPASE 19   No results found for this basename: AMMONIA,  in the last 168 hours CBC:  Recent Labs Lab 08/15/14 0042 08/15/14 0751  WBC 14.0* 24.5*  NEUTROABS  --  20.8*  HGB 13.2 12.5*  HCT 40.4 38.7*  MCV 72.7* 73.4*  PLT 209 218   Cardiac Enzymes:  Recent Labs Lab 08/15/14 0042 08/15/14 0426 08/15/14 0955 08/15/14 1525 08/15/14 1836  TROPONINI <0.30 <0.30 <0.30 <0.30 <0.30   BNP (last 3 results)  Recent Labs  12/10/13 0017 12/15/13 0605  PROBNP 51.9 72.5   CBG:  Recent Labs Lab 08/16/14 0743 08/16/14 1205 08/16/14 1534 08/16/14 2054 08/17/14 0755  GLUCAP 181* 184* 131* 128* 135*    Recent Results (from the past 240 hour(s))  CULTURE, BLOOD (ROUTINE X 2)     Status: None   Collection Time    08/15/14 12:42 AM      Result Value Ref Range Status   Specimen Description BLOOD LEFT HAND   Final   Special Requests BOTTLES DRAWN AEROBIC ONLY 10CC   Final   Culture  Setup Time     Final   Value: 08/15/2014 10:30     Performed at  [facility]     Final   Value:        BLOOD CULTURE RECEIVED NO GROWTH TO DATE CULTURE WILL BE HELD FOR 5 DAYS BEFORE ISSUING A FINAL NEGATIVE REPORT     Performed at [facility]   Report Status PENDING   Incomplete  CULTURE, BLOOD (ROUTINE X 2)     Status: None   Collection Time    08/15/14 12:42 AM      Result Value Ref Range Status   Specimen Description BLOOD RIGHT UPPER ARM   Final   Special Requests BOTTLES DRAWN AEROBIC AND ANAEROBIC 10CC EA   Final   Culture  Setup Time     Final   Value: 08/15/2014 10:30     Performed at [facility]   Culture     Final   Value:        BLOOD CULTURE RECEIVED NO GROWTH TO DATE CULTURE WILL BE HELD FOR 5 DAYS BEFORE ISSUING A FINAL NEGATIVE REPORT     Performed at [facility]   Report Status PENDING   Incomplete  RAPID STREP SCREEN     Status: None   Collection Time    08/15/14 12:50 AM       Result Value Ref Range Status   Streptococcus, Group A Screen (Direct) NEGATIVE  NEGATIVE Final   Comment: (NOTE)     A Rapid Antigen test may result negative if the antigen level in the     sample is below the detection level of this test. The FDA has not     cleared this test as a stand-alone test therefore the rapid antigen     negative result has reflexed to a Group A Strep culture.  CULTURE, GROUP A STREP     Status: None   Collection Time    08/15/14 12:50 AM      Result Value Ref Range Status   Specimen Description THROAT   Final   Special Requests ADDED 0119   Final   Culture     Final   Value: No Beta Hemolytic Streptococci Isolated     Performed at [facility]   Report Status 08/16/2014 FINAL   Final  URINE CULTURE     Status: None   Collection Time    08/15/14  2:29 AM      Result Value Ref Range Status   Specimen Description URINE, RANDOM   Final   Special Requests NONE   Final   Culture  Setup Time     Final   Value: 08/15/2014 02:46     Performed at [place]     Final   Value: NO GROWTH     Performed at [facility]   Culture     Final   Value: NO GROWTH     Performed at [facility]   Report Status 08/16/2014 FINAL   Final  MRSA PCR SCREENING     Status: None   Collection Time    08/15/14  7:31 AM      Result Value Ref Range Status   MRSA by PCR NEGATIVE  NEGATIVE Final   Comment:            The GeneXpert MRSA Assay (FDA     approved for NASAL specimens     only), is one component of a     comprehensive MRSA colonization  surveillance program. It is not     intended to diagnose MRSA     infection nor to guide or     monitor treatment for     MRSA infections.  CLOSTRIDIUM DIFFICILE BY PCR     Status: None   Collection Time    08/15/14 10:56 AM      Result Value Ref Range Status   C difficile by pcr NEGATIVE  NEGATIVE Final     Studies:  Recent x-ray studies have been reviewed in detail by the  Attending Physician  Scheduled Meds:  Scheduled Meds: . amLODipine  10 mg Oral Daily  . cefTRIAXone (ROCEPHIN)  IV  2 g Intravenous Q12H  . feeding supplement (ENSURE COMPLETE)  237 mL Oral BID BM  . hydrALAZINE  50 mg Oral 3 times per day  . insulin aspart  0-9 Units Subcutaneous TID WC  . isosorbide dinitrate  10 mg Oral TID  . simvastatin  10 mg Oral q1800  . vancomycin  750 mg Intravenous Q8H    Time spent on care of this patient: 25 mins   Sheila Oats , MD   [facility] Office  [phone] Pager [phone]  On-Call/Text Page:      [email or web address]      password [email or web address]  If 7PM-7AM, please contact night-coverage [email or web address] Password [email or web address] 08/17/2014, 10:36 AM   LOS: 2 days

## 2014-08-17 NOTE — Progress Notes (Signed)
Pt. Wanting to leave AMA, text paged Dr. Dillard Essex & informed.  Pt. Signed AMA papers and walked to his car.  IV d/c'd.  Alphonzo Lemmings, RN

## 2014-08-17 NOTE — Discharge Summary (Signed)
Physician Discharge Summary  TROYCE GIESKE BTD:[id number] DOB: 08/10/1952 DOA: 08/15/2014  PCP: Philis Fendt, MD  Admit date: 08/15/2014 Discharge date: 08/17/2014   Recommendations for Outpatient Follow-up:  Patient left AMA>> followup outpatient with PCP  Discharge Diagnoses:  Principal Problem:   Fever Active Problems:   Chest pain   Urinary tract infection   Headache   Discharge Condition: Stable, left AMA    Filed Weights   08/15/14 0020 08/15/14 0720 08/15/14 2000  Weight: 72.576 kg (160 lb) 73.2 kg (161 lb 6 oz) 73.2 kg (161 lb 6 oz)    History of present illness:  [name] is a 63 y.o. male with history of diabetes mellitus, hypertension pituitary tumor woke up from the sleep at midnight with complaints of severe headache neck pain chest pain and fever with nausea vomiting. Patient states he has chronic headache and also has history of pituitary tumor for which he is following up with Dr. Saintclair Halsted neurosurgeon. Headache is mostly frontal and was associated with some blurred vision and nausea vomiting. At the same time patient also had neck pain and chest pain. The chest pain was reproducible in the ER on the left anterior chest wall. Denies any associated shortness of breath. In the ER patient was found to have fever of 104F. Blood cultures were obtained. Lactic acid was mildly elevated. Patient's CT head did not show anything acute. Lumbar puncture was attempted but was unable to succeed by the ED physician. UA showing features consistent with UTI and patient was started on empiric antibiotics to cover for possible sepsis. On my exam patient presently is able to see things clearly and is nonfocal. Still has neck rigidity and headache. Denies any recent travel anywhere outside of any tic bites are insect bites. Denies any sick contacts. Denies having used any IV drugs recently. Has been using Percocet for chronic headaches. His fever has improved after coming to the ER and  is present around 61F. Patient did have nausea vomiting but denies any abdominal pain at all last 2 days has been having some dysuria and diarrhea. Denies any penile discharge. Patient is feeling fatigued and tired. Chest pain is left-sided anterior chest wall stabbing-like.   Hospital Course:  Severe headache with fever  -Patient with history of headaches from his pituitary gland tumor however per patient these headaches much more severe and accompanied by other neurologic signs photophobia, stiff neck, fever, chills, Rigors.  -Failed attempted LPx2 in the ED and perIR (Fluoroscopy guided lumbar) on 8/19  - Urine cultures with no growth to date, C. difficile negative  -Neurosurgery was been consulted via phone per Dr Sherral Hammers MRI showed stable pituitary lesion compatible with adenoma since 2012, no further recommendations per NS  -UDS; positive for opiates (note patient is on home opioids)  -continue pain management  - ID consulted on 8/19 for further recommendations, and abx narrowed to Vanc and Rocephin, apparently pt was agreeable to LP to Dr Linus Salmons oon 8/19 , but on follow up to order LP per IR with more sedation he states that he does not want any further LP attempts at all even with sedation  -Patient was clinically improving on antibiotics, but he refused any further LP attempt as discussed above>> Dr Linus Salmons to followed and recommended 2 weeks of IV antibiotics for empiric treatment for presumed meningitis>> but patient states that he does not want anything else done and as per ID note that he would rather die than have anything else done.  He signed his AMA papers and left. Chest pain  - Resolved  -Troponin x3 negative  -Chest CT and abdominal CT ; negative for any dissection but did show thickened bladder wall with diverticuli. See results below  ?UTI  -urine culture with NGTD  Pituitary tumor  - see #1.  Hypertension  - Amlodipine 10 mg continue home medications.   Diabetes mellitus   - holding metformin for now  -Continue sensitive SSI History of drug abuse - patient denies taking any IV drugs recently. Check urine drug screen.  - A1c is 6.7, HDL is 30 pleural cholesterol is 128 and LDL is 79   Procedures:  Failed LP attempt x 2>> in EDP and per IR  Consultations:  Neurosurgery-phone consult with Dr. Sherral Hammers  Infectious disease-Dr.,  Discharge Exam: Filed Vitals:   08/17/14 1323  BP: 141/63  Pulse: 71  Temp: 98 F (36.7 C)  Resp: 18   General: A./O. x4, No acute respiratory distress  Lungs: Clear to auscultation bilaterally without wheezes or crackles  Cardiovascular: Regular rate and rhythm without murmur gallop or rub normal S1 and S2  Neck: supple  Abdomen: Nontender, nondistended, soft, bowel sounds positive, no rebound, no ascites, no appreciable mass  Extremities: No significant cyanosis, clubbing, or edema bilateral lower extremities  Neurologic; cranial nerves II through XII intact, tongue/uvula midline, all extremity strength 5/5, sensation intact throughout,    Discharge Instructions You were cared for by a hospitalist during your hospital stay. If you have any questions about your discharge medications or the care you received while you were in the hospital after you are discharged, you can call the unit and asked to speak with the hospitalist on call if the hospitalist that took care of you is not available. Once you are discharged, your primary care physician will handle any further medical issues. Please note that NO REFILLS for any discharge medications will be authorized once you are discharged, as it is imperative that you return to your primary care physician (or establish a relationship with a primary care physician if you do not have one) for your aftercare needs so that they can reassess your need for medications and monitor your lab values.     Medication List    ASK your doctor about these medications       acetaminophen 500 MG  tablet  Commonly known as:  TYLENOL  Take 500 mg by mouth every 6 (six) hours as needed for headache.     amLODipine 10 MG tablet  Commonly known as:  NORVASC  Take 10 mg by mouth daily.     aspirin EC 81 MG tablet  Take 81 mg by mouth daily.     b complex vitamins tablet  Take 1 tablet by mouth daily.     hydrALAZINE 50 MG tablet  Commonly known as:  APRESOLINE  Take 1 tablet (50 mg total) by mouth every 8 (eight) hours.     isosorbide dinitrate 10 MG tablet  Commonly known as:  ISORDIL  Take 1 tablet (10 mg total) by mouth 3 (three) times daily.     metFORMIN 500 MG tablet  Commonly known as:  GLUCOPHAGE  Take 500 mg by mouth 2 (two) times daily with a meal.     oxyCODONE-acetaminophen 5-325 MG per tablet  Commonly known as:  PERCOCET/ROXICET  Take 1 tablet by mouth every 4 (four) hours as needed for severe pain.     pravastatin 20 MG tablet  Commonly known as:  PRAVACHOL  Take 20 mg by mouth at bedtime.     traMADol 50 MG tablet  Commonly known as:  ULTRAM  Take 50 mg by mouth every 6 (six) hours as needed for moderate pain.       No Known Allergies    The results of significant diagnostics from this hospitalization (including imaging, microbiology, ancillary and laboratory) are listed below for reference.    Significant Diagnostic Studies: Ct Head Wo Contrast  08/15/2014   CLINICAL DATA:  Fever and headache  EXAM: CT HEAD WITHOUT CONTRAST  TECHNIQUE: Contiguous axial images were obtained from the base of the skull through the vertex without intravenous contrast.  COMPARISON:  07/01/2013  FINDINGS: Skull and Sinuses:No acute fracture or destructive process.  Mild inflammatory mucosal thickening in the bilateral ethmoid sinuses.  A periapical cyst associated with a maxillary incisor noted on 12/26/2013 brain MRI is not clearly seen on this examination.  Orbits: No acute abnormality.  Brain: No evidence of acute abnormality, such as acute infarction, hemorrhage,  hydrocephalus, or mass lesion/mass effect.  The patient has a known pituitary adenoma which is not well resolved by CT. No gross indication of interval hemorrhage.  IMPRESSION: 1. No acute intracranial abnormality. 2. Chronic findings are discussed above.   Electronically Signed   By: Jorje Guild M.D.   On: 08/15/2014 02:33   Mr Jeri Cos JK Contrast  08/15/2014   CLINICAL DATA:  63 year old male with headache, fever, hypertension, known pituitary adenoma. Initial encounter.  EXAM: MRI HEAD WITHOUT AND WITH CONTRAST  TECHNIQUE: Multiplanar, multiecho pulse sequences of the brain and surrounding structures were obtained without and with intravenous contrast.  CONTRAST:  1mL MULTIHANCE GADOBENATE DIMEGLUMINE 529 MG/ML IV SOLN  COMPARISON:  Brain MRI 12/26/2013, and earlier.  FINDINGS: Stable cerebral volume since 2014. Major intracranial vascular flow voids are preserved. No restricted diffusion to suggest acute infarction. No midline shift, mass effect, evidence of mass lesion, ventriculomegaly, extra-axial collection or acute intracranial hemorrhage. Cervicomedullary junction within normal limits. Negative visualized cervical spine.  Stable gray and white matter signal, within normal limits for age except for a 9 mm focus of chronic blood products in the left occipital lobe (series 7 image 54) seen to better advantage today due to susceptibility weighted imaging, but present over the series of exams. No associated T2 or T1 heterogeneity. No cortical encephalomalacia identified. No new signal abnormality identified. Excluding the pituitary region, No abnormal enhancement identified.  Visualized orbit soft tissues are within normal limits. Visible internal auditory structures appear normal. Mastoids are clear. Minor paranasal sinus mucosal thickening is stable. Visualized scalp soft tissues are within normal limits.  Dedicated pituitary imaging. Mild motion artifact today. Overall pituitary size and configuration  Stable since 2012, measuring 13 x 10 x 17 mm (AP by transverse by CC). Heterogeneous enhancement throughout the pituitary has not significantly changed. Stable pre contrast T1 appearance of the pituitary tumor. The cavernous sinuses appear stable and within normal limits. Infundibulum is stable with mild deviation to the left. Partial effacement of the suprasellar cistern, with no optic chiasm mass effect, is stable. Hypothalamus within normal limits. No other abnormal enhancement identified.  IMPRESSION: 1. Stable pituitary lesion, compatible with adenoma, since 2012. No cavernous sinus involvement. No significant suprasellar mass effect. 2. No new intracranial abnormality. 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   Electronically Signed   By: Lars Pinks M.D.   On: 08/15/2014 17:24   Dg Chest Port 1 View  08/15/2014  CLINICAL DATA:  Fever and chest pain  EXAM: PORTABLE CHEST - 1 VIEW  COMPARISON:  05/15/2014  FINDINGS: No cardiomegaly for technique. Negative mediastinal contours. There is no edema, consolidation, effusion, or pneumothorax.  IMPRESSION: No active disease.   Electronically Signed   By: Jorje Guild M.D.   On: 08/15/2014 00:37   Ct Angio Chest Aortic Dissect W &/or W/o  08/15/2014   CLINICAL DATA:  Chest pain.  EXAM: CT ANGIOGRAPHY CHEST, ABDOMEN AND PELVIS  TECHNIQUE: Multidetector CT imaging through the chest, abdomen and pelvis was performed using the standard protocol during bolus administration of intravenous contrast. Multiplanar reconstructed images and MIPs were obtained and reviewed to evaluate the vascular anatomy.  CONTRAST:  18mL OMNIPAQUE IOHEXOL 350 MG/ML SOLN  COMPARISON:  None.  FINDINGS: CTA CHEST FINDINGS  The thoracic aorta is normal. No aneurysm or dissection. The pulmonary arteries are normal. No pulmonary emboli. The heart is normal in size. No pericardial effusion. No mediastinal or hilar mass or adenopathy. There are scattered borderline lymph  nodes. Coronary artery calcifications are noted.  The lungs demonstrate dependent bibasilar atelectasis. No edema or effusions. No worrisome pulmonary lesions.  Review of the MIP images confirms the above findings.  CTA ABDOMEN AND PELVIS FINDINGS  The abdominal aorta demonstrates moderate atherosclerotic calcifications. The major branch vessels are patent. No aneurysm or dissection. 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  Stable left hepatic lobe liver lesion since a chest CT of 2012. 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 The gallbladder is normal. No common bile duct dilatation. The pancreas is unremarkable. The spleen is normal in size. Stable enhancing splenic lesion is likely a hemangioma. The adrenal glands and kidneys are unremarkable.  The stomach, duodenum, small bowel and colon are grossly normal without oral contrast. No mesenteric or retroperitoneal mass or adenopathy.  The bladder is distended and thick walled with large bladder diverticuli. The prostate gland and seminal vesicles are unremarkable. No pelvic mass or adenopathy. No inguinal mass or adenopathy.  The bony structures are unremarkable.  Review of the MIP images confirms the above findings.  IMPRESSION: No CT findings for a thoracic aortic dissection or aneurysm. The pulmonary arteries are normal.  Borderline mediastinal and hilar lymph nodes but no mass.  No acute pulmonary findings.  Dependent atelectasis/edema.  Moderate scattered atherosclerotic calcifications involving the abdominal aorta but no aneurysm or dissection. More significant disease involving the iliac arteries with approximately 50% stenosis of the left common iliac artery.  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  Stable left hepatic lobe liver lesion and splenic lesion.   Electronically Signed   By: Kalman Jewels M.D.   On: 08/15/2014 02:37   Ct Cta Abd/pel W/cm &/or W/o  Cm  08/15/2014   CLINICAL DATA:  Chest pain.  EXAM: CT ANGIOGRAPHY CHEST, ABDOMEN AND PELVIS  TECHNIQUE: Multidetector CT imaging through the chest, abdomen and pelvis was performed using the standard protocol during bolus administration of intravenous contrast. Multiplanar reconstructed images and MIPs were obtained and reviewed to evaluate the vascular anatomy.  CONTRAST:  171mL OMNIPAQUE IOHEXOL 350 MG/ML SOLN  COMPARISON:  None.  FINDINGS: CTA CHEST FINDINGS  The thoracic aorta is normal. No aneurysm or dissection. The pulmonary arteries are normal. No pulmonary emboli. The heart is normal in size. No pericardial effusion. No mediastinal or hilar mass or adenopathy. There are scattered borderline lymph nodes. Coronary artery calcifications are noted.  The lungs demonstrate  dependent bibasilar atelectasis. No edema or effusions. No worrisome pulmonary lesions.  Review of the MIP images confirms the above findings.  CTA ABDOMEN AND PELVIS FINDINGS  The abdominal aorta demonstrates moderate atherosclerotic calcifications. The major branch vessels are patent. No aneurysm or dissection. 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  Stable left hepatic lobe liver lesion since a chest CT of 2012. 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 The gallbladder is normal. No common bile duct dilatation. The pancreas is unremarkable. The spleen is normal in size. Stable enhancing splenic lesion is likely a hemangioma. The adrenal glands and kidneys are unremarkable.  The stomach, duodenum, small bowel and colon are grossly normal without oral contrast. No mesenteric or retroperitoneal mass or adenopathy.  The bladder is distended and thick walled with large bladder diverticuli. The prostate gland and seminal vesicles are unremarkable. No pelvic mass or adenopathy. No inguinal mass or adenopathy.  The bony structures are unremarkable.  Review of the MIP images  confirms the above findings.  IMPRESSION: No CT findings for a thoracic aortic dissection or aneurysm. The pulmonary arteries are normal.  Borderline mediastinal and hilar lymph nodes but no mass.  No acute pulmonary findings.  Dependent atelectasis/edema.  Moderate scattered atherosclerotic calcifications involving the abdominal aorta but no aneurysm or dissection. More significant disease involving the iliac arteries with approximately 50% stenosis of the left common iliac artery.  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  Stable left hepatic lobe liver lesion and splenic lesion.   Electronically Signed   By: Kalman Jewels M.D.   On: 08/15/2014 02:37    Microbiology: Recent Results (from the past 240 hour(s))  CULTURE, BLOOD (ROUTINE X 2)     Status: None   Collection Time    08/15/14 12:42 AM      Result Value Ref Range Status   Specimen Description BLOOD LEFT HAND   Final   Special Requests BOTTLES DRAWN AEROBIC ONLY 10CC   Final   Culture  Setup Time     Final   Value: 08/15/2014 10:30     Performed at [facility]   Culture     Final   Value:        BLOOD CULTURE RECEIVED NO GROWTH TO DATE CULTURE WILL BE HELD FOR 5 DAYS BEFORE ISSUING A FINAL NEGATIVE REPORT     Performed at [facility]   Report Status PENDING   Incomplete  CULTURE, BLOOD (ROUTINE X 2)     Status: None   Collection Time    08/15/14 12:42 AM      Result Value Ref Range Status   Specimen Description BLOOD RIGHT UPPER ARM   Final   Special Requests BOTTLES DRAWN AEROBIC AND ANAEROBIC 10CC EA   Final   Culture  Setup Time     Final   Value: 08/15/2014 10:30     Performed at [facility]   Culture     Final   Value:        BLOOD CULTURE RECEIVED NO GROWTH TO DATE CULTURE WILL BE HELD FOR 5 DAYS BEFORE ISSUING A FINAL NEGATIVE REPORT     Performed at [facility]   Report Status PENDING   Incomplete  RAPID STREP SCREEN     Status: None   Collection Time    08/15/14  12:50 AM      Result Value Ref Range Status   Streptococcus, Group A  Screen (Direct) NEGATIVE  NEGATIVE Final   Comment: (NOTE)     A Rapid Antigen test may result negative if the antigen level in the     sample is below the detection level of this test. The FDA has not     cleared this test as a stand-alone test therefore the rapid antigen     negative result has reflexed to a Group A Strep culture.  CULTURE, GROUP A STREP     Status: None   Collection Time    08/15/14 12:50 AM      Result Value Ref Range Status   Specimen Description THROAT   Final   Special Requests ADDED 0119   Final   Culture     Final   Value: No Beta Hemolytic Streptococci Isolated     Performed at [facility]   Report Status 08/16/2014 FINAL   Final  URINE CULTURE     Status: None   Collection Time    08/15/14  2:29 AM      Result Value Ref Range Status   Specimen Description URINE, RANDOM   Final   Special Requests NONE   Final   Culture  Setup Time     Final   Value: 08/15/2014 02:46     Performed at [place]     Final   Value: NO GROWTH     Performed at [facility]   Culture     Final   Value: NO GROWTH     Performed at [facility]   Report Status 08/16/2014 FINAL   Final  MRSA PCR SCREENING     Status: None   Collection Time    08/15/14  7:31 AM      Result Value Ref Range Status   MRSA by PCR NEGATIVE  NEGATIVE Final   Comment:            The GeneXpert MRSA Assay (FDA     approved for NASAL specimens     only), is one component of a     comprehensive MRSA colonization     surveillance program. It is not     intended to diagnose MRSA     infection nor to guide or     monitor treatment for     MRSA infections.  CLOSTRIDIUM DIFFICILE BY PCR     Status: None   Collection Time    08/15/14 10:56 AM      Result Value Ref Range Status   C difficile by pcr NEGATIVE  NEGATIVE Final     Labs: Basic Metabolic Panel:  Recent Labs Lab  08/15/14 0042 08/15/14 0751  NA 139 140  K 3.5* 3.9  CL 100 104  CO2 22 25  GLUCOSE 112* 147*  BUN 12 11  CREATININE 0.88 0.88  CALCIUM 9.6 9.0   Liver Function Tests:  Recent Labs Lab 08/15/14 0426 08/15/14 0751  AST 32 30  ALT 28 29  ALKPHOS 76 63  BILITOT 0.7 0.5  PROT 6.6 6.8  ALBUMIN 2.9* 2.9*    Recent Labs Lab 08/15/14 0426  LIPASE 19   No results found for this basename: AMMONIA,  in the last 168 hours CBC:  Recent Labs Lab 08/15/14 0042 08/15/14 0751  WBC 14.0* 24.5*  NEUTROABS  --  20.8*  HGB 13.2 12.5*  HCT 40.4 38.7*  MCV 72.7* 73.4*  PLT 209 218   Cardiac Enzymes:  Recent Labs Lab  08/15/14 0042 08/15/14 0426 08/15/14 0955 08/15/14 1525 08/15/14 1836  TROPONINI <0.30 <0.30 <0.30 <0.30 <0.30   BNP: BNP (last 3 results)  Recent Labs  12/10/13 0017 12/15/13 0605  PROBNP 51.9 72.5   CBG:  Recent Labs Lab 08/16/14 1205 08/16/14 1534 08/16/14 2054 08/17/14 0755 08/17/14 1216  GLUCAP 184* 131* 128* 135* 179*       Signed:  Glenden Rossell C  [facility] 08/17/2014, 6:10 PM

## 2014-08-17 NOTE — Progress Notes (Signed)
Saw and evaluated patients.  He had signs and concerns for bacterial meningitis with photophobia, high fever, nuchal rigidity and has been here on IV antibiotics and much improved.  He refused LP so I recommended IV antibiotics to complete a 2 weeks course for possible meningitis.  The patient refuses anything and is going home today.  He does not want antibiotics and is going home regardless.  I discussed the possibility of meningitis and risk of severe neurologic impairment and death and he voiced his understanding.  He stated he would rather die than have anything else done.  His sister was in the room as well and despite her objections he continued to refuse.

## 2014-08-18 NOTE — Care Management Note (Signed)
    Page 1 of 1   08/18/2014     10:03:05 AM CARE MANAGEMENT NOTE 08/18/2014  Patient:  [name], [name]   Account Number:  [id number]  Date Initiated:  08/18/2014  Documentation initiated by:  Tomi Bamberger  Subjective/Objective Assessment:   dx cp , fever ?meningitis  admit- from home.     Action/Plan:   Anticipated DC Date:  08/17/2014   Anticipated DC Plan:  [place]  CM consult      Choice offered to / List presented to:             Status of service:  Completed, signed off Medicare Important Message given?   (If response is "NO", the following Medicare IM given date fields will be blank) Date Medicare IM given:   Medicare IM given by:   Date Additional Medicare IM given:   Additional Medicare IM given by:    Discharge Disposition:  HOME/SELF CARE  Per UR Regulation:  Reviewed for med. necessity/level of care/duration of stay  If discussed at [place] of Stay Meetings, dates discussed:    Comments:

## 2014-08-30 ENCOUNTER — Emergency Department (HOSPITAL_COMMUNITY): Payer: Medicaid Other

## 2014-08-30 ENCOUNTER — Encounter (HOSPITAL_COMMUNITY): Payer: Self-pay | Admitting: Emergency Medicine

## 2014-08-30 ENCOUNTER — Emergency Department (HOSPITAL_COMMUNITY)
Admission: EM | Admit: 2014-08-30 | Discharge: 2014-08-30 | Disposition: A | Discharge status: Home / Self Care | Payer: Medicaid Other | Attending: Emergency Medicine | Admitting: Emergency Medicine

## 2014-08-30 DIAGNOSIS — Z79899 Other long term (current) drug therapy: Secondary | ICD-10-CM | POA: Diagnosis not present

## 2014-08-30 DIAGNOSIS — M79605 Pain in left leg: Secondary | ICD-10-CM

## 2014-08-30 DIAGNOSIS — M79609 Pain in unspecified limb: Secondary | ICD-10-CM | POA: Insufficient documentation

## 2014-08-30 DIAGNOSIS — E119 Type 2 diabetes mellitus without complications: Secondary | ICD-10-CM | POA: Diagnosis not present

## 2014-08-30 DIAGNOSIS — Z85841 Personal history of malignant neoplasm of brain: Secondary | ICD-10-CM | POA: Insufficient documentation

## 2014-08-30 DIAGNOSIS — I10 Essential (primary) hypertension: Secondary | ICD-10-CM | POA: Insufficient documentation

## 2014-08-30 DIAGNOSIS — Z7982 Long term (current) use of aspirin: Secondary | ICD-10-CM | POA: Diagnosis not present

## 2014-08-30 DIAGNOSIS — I509 Heart failure, unspecified: Secondary | ICD-10-CM | POA: Insufficient documentation

## 2014-08-30 DIAGNOSIS — F17200 Nicotine dependence, unspecified, uncomplicated: Secondary | ICD-10-CM | POA: Insufficient documentation

## 2014-08-30 LAB — URINALYSIS, ROUTINE W REFLEX MICROSCOPIC
Bilirubin Urine: NEGATIVE
Glucose, UA: NEGATIVE mg/dL
Hgb urine dipstick: NEGATIVE
Ketones, ur: NEGATIVE mg/dL
Leukocytes, UA: NEGATIVE
Nitrite: NEGATIVE
Protein, ur: NEGATIVE mg/dL
SPECIFIC GRAVITY, URINE: 1.011 (ref 1.005–1.030)
UROBILINOGEN UA: 1 mg/dL (ref 0.0–1.0)
pH: 7.5 (ref 5.0–8.0)

## 2014-08-30 LAB — CULTURE, BLOOD (ROUTINE X 2)

## 2014-08-30 LAB — URINE CULTURE

## 2014-08-30 LAB — CULTURE, GROUP A STREP

## 2014-08-30 LAB — HIV ANTIBODY (ROUTINE TESTING W REFLEX): HIV: NONREACTIVE — AB

## 2014-08-30 LAB — HEMOGLOBIN A1C
HEMOGLOBIN A1C: 6.7 % — AB (ref <5.7)
Mean Plasma Glucose: 146 mg/dL — ABNORMAL HIGH (ref <117)

## 2014-08-30 LAB — CORTISOL: Cortisol, Plasma: 14.2 ug/dL

## 2014-08-30 MED ORDER — TRAMADOL HCL 50 MG PO TABS: 50.0000 mg | ORAL_TABLET | Freq: Four times a day (QID) | ORAL | Status: DC | PRN

## 2014-08-30 MED ORDER — OXYCODONE-ACETAMINOPHEN 5-325 MG PO TABS
2.0000 | ORAL_TABLET | Freq: Once | ORAL | Status: AC
  Administered 2014-08-30: 2 via ORAL
  Filled 2014-08-30: qty 2

## 2014-08-30 NOTE — ED Notes (Signed)
Pt reports being seen here last week and had LP done and now having pain to left upper leg. Denies injury. Also had recent UTI and wants that re-checked.

## 2014-08-30 NOTE — Discharge Instructions (Signed)
Hip Pain Your hip is the joint between your upper legs and your lower pelvis. The bones, cartilage, tendons, and muscles of your hip joint perform a lot of work each day supporting your body weight and allowing you to move around. Hip pain can range from a minor ache to severe pain in one or both of your hips. Pain may be felt on the inside of the hip joint near the groin, or the outside near the buttocks and upper thigh. You may have swelling or stiffness as well.  HOME CARE INSTRUCTIONS   Take medicines only as directed by your health care provider.  Apply ice to the injured area:  Put ice in a plastic bag.  Place a towel between your skin and the bag.  Leave the ice on for 15-20 minutes at a time, 3-4 times a day.  Keep your leg raised (elevated) when possible to lessen swelling.  Avoid activities that cause pain.  Follow specific exercises as directed by your health care provider.  Sleep with a pillow between your legs on your most comfortable side.  Record how often you have hip pain, the location of the pain, and what it feels like. SEEK MEDICAL CARE IF:   You are unable to put weight on your leg.  Your hip is red or swollen or very tender to touch.  Your pain or swelling continues or worsens after 1 week.  You have increasing difficulty walking.  You have a fever. SEEK IMMEDIATE MEDICAL CARE IF:   You have fallen.  You have a sudden increase in pain and swelling in your hip. MAKE SURE YOU:   Understand these instructions.  Will watch your condition.  Will get help right away if you are not doing well or get worse. Document Released: 06/04/2010 Document Revised: 05/01/2014 Document Reviewed: 08/11/2013 [employer] Patient Information 2015 [employer]. This information is not intended to replace advice given to you by your health care provider. Make sure you discuss any questions you have with your health care provider.  

## 2014-08-30 NOTE — ED Provider Notes (Signed)
CSN: [id number]     Arrival date & time 08/30/14  1008 History   First MD Initiated Contact with Patient 08/30/14 1021     Chief Complaint  Patient presents with  . Leg Pain     (Consider location/radiation/quality/duration/timing/severity/associated sxs/prior Treatment) Patient is a 63 y.o. male presenting with leg pain. The history is provided by the patient.  Leg Pain Location:  Hip and leg Time since incident:  2 days Injury: no   Hip location:  L hip Leg location:  L upper leg (anterior) Pain details:    Quality:  Aching and sharp   Radiates to:  Does not radiate   Severity:  Severe   Onset quality:  Gradual   Duration:  2 days   Timing:  Constant   Progression:  Worsening Chronicity:  New Dislocation: no   Foreign body present:  No foreign bodies Tetanus status:  Up to date Prior injury to area:  No Relieved by:  Rest Exacerbated by: movement. Ineffective treatments:  None tried Associated symptoms: muscle weakness (chronic, unchanged)   Associated symptoms: no back pain, no decreased ROM, no fatigue, no fever, no neck pain, no numbness and no stiffness   Risk factors: recent illness   Risk factors: no concern for non-accidental trauma, no frequent fractures, no known bone disorder and no obesity     Past Medical History  Diagnosis Date  . Hypertension   . Diabetes mellitus   . Brain tumor   . CVA (cerebral vascular accident)   . Narcotic abuse   . CHF (congestive heart failure)    History reviewed. No pertinent past surgical history. Family History  Problem Relation Age of Onset  . CAD Father   . Diabetes Brother   . Diabetes Sister   . Hypertension Father   . Hypertension Mother    History  Substance Use Topics  . Smoking status: Current Every Day Smoker  . Smokeless tobacco: Not on file  . Alcohol Use: Yes     Comment: 6 pack a week    Review of Systems  Constitutional: Negative for fever, activity change, appetite change and fatigue.  HENT:  Negative for congestion, facial swelling, rhinorrhea and trouble swallowing.   Eyes: Negative for photophobia and pain.  Respiratory: Negative for cough, chest tightness and shortness of breath.   Cardiovascular: Negative for chest pain and leg swelling.  Gastrointestinal: Negative for nausea, vomiting, abdominal pain, diarrhea and constipation.  Endocrine: Negative for polydipsia and polyuria.  Genitourinary: Negative for dysuria, urgency, decreased urine volume and difficulty urinating.  Musculoskeletal: Negative for back pain, gait problem, neck pain and stiffness.  Skin: Negative for color change, rash and wound.  Allergic/Immunologic: Negative for immunocompromised state.  Neurological: Negative for dizziness, facial asymmetry, speech difficulty, weakness, numbness and headaches.  Psychiatric/Behavioral: Negative for confusion, decreased concentration and agitation.      Allergies  Review of patient's allergies indicates no known allergies.  Home Medications   Prior to Admission medications   Medication Sig Start Date End Date Taking? Authorizing Provider  acetaminophen (TYLENOL) 500 MG tablet Take 500 mg by mouth every 6 (six) hours as needed for headache.   Yes Historical Provider, MD  amLODipine (NORVASC) 10 MG tablet Take 10 mg by mouth daily.   Yes Historical Provider, MD  aspirin EC 81 MG tablet Take 81 mg by mouth daily.   Yes Historical Provider, MD  b complex vitamins tablet Take 1 tablet by mouth daily.   Yes Historical Provider, MD  hydrALAZINE (APRESOLINE) 50 MG tablet Take 1 tablet (50 mg total) by mouth every 8 (eight) hours. 12/27/13  Yes Debbe Odea, MD  isosorbide dinitrate (ISORDIL) 10 MG tablet Take 1 tablet (10 mg total) by mouth 3 (three) times daily. 12/27/13  Yes Debbe Odea, MD  metFORMIN (GLUCOPHAGE) 500 MG tablet Take 500 mg by mouth 2 (two) times daily with a meal.   Yes Historical Provider, MD  oxyCODONE-acetaminophen (PERCOCET/ROXICET) 5-325 MG per  tablet Take 1 tablet by mouth every 4 (four) hours as needed for severe pain.   Yes Historical Provider, MD  pravastatin (PRAVACHOL) 20 MG tablet Take 20 mg by mouth at bedtime.   Yes Historical Provider, MD  traMADol (ULTRAM) 50 MG tablet Take 50 mg by mouth every 6 (six) hours as needed for moderate pain. 12/15/13  Yes Virgel Manifold, MD  traMADol (ULTRAM) 50 MG tablet Take 1 tablet (50 mg total) by mouth every 6 (six) hours as needed. 08/30/14   Ernestina Patches, MD   BP 153/78  Pulse 69  Temp(Src) 98.6 F (37 C) (Oral)  Resp 19  SpO2 98% Physical Exam  Constitutional: He is oriented to person, place, and time. He appears well-developed and well-nourished. No distress.  HENT:  Head: Normocephalic and atraumatic.  Mouth/Throat: No oropharyngeal exudate.  Eyes: Pupils are equal, round, and reactive to light.  Neck: Normal range of motion. Neck supple.  Cardiovascular: Normal rate, regular rhythm and normal heart sounds.  Exam reveals no gallop and no friction rub.   No murmur heard. Pulmonary/Chest: Effort normal and breath sounds normal. No respiratory distress. He has no wheezes. He has no rales.  Abdominal: Soft. Bowel sounds are normal. He exhibits no distension and no mass. There is no tenderness. There is no rebound and no guarding.  Musculoskeletal: Normal range of motion. He exhibits no edema.       Left hip: He exhibits tenderness and bony tenderness. He exhibits normal range of motion, normal strength, no swelling, no crepitus, no deformity and no laceration.       Left upper leg: He exhibits tenderness. He exhibits no swelling, no edema, no deformity and no laceration.       Legs: Neurological: He is alert and oriented to person, place, and time.  Skin: Skin is warm and dry.  Psychiatric: He has a normal mood and affect.    ED Course  Procedures (including critical care time) Labs Review Labs Reviewed  URINALYSIS, ROUTINE W REFLEX MICROSCOPIC - Abnormal; Notable for the  following:    Color, Urine AMBER (*)    All other components within normal limits  URINE CULTURE    Imaging Review Dg Hip Complete Left  08/30/2014   CLINICAL DATA:  Left hip pain.  EXAM: LEFT HIP - COMPLETE 2+ VIEW  COMPARISON:  None.  FINDINGS: Hip joint space is normal. No subchondral sclerosis or cyst formation. No osteophytosis. Obturator rings are intact.  IMPRESSION: No acute or chronic findings in the hips to explain the patient's pain.   Electronically Signed   By: Lorin Picket M.D.   On: 08/30/2014 12:15     EKG Interpretation None      MDM   Final diagnoses:  Left leg pain    Pt is a 63 y.o. male with Pmhx as above who presents with left anterior leg pain. Patient was admitted to the hospital on 08/15/2014 as a code sepsis with fever chest pain, headache, neck pain. He was found to have likely UTI.  Multiple LP attempts were made by ED as well as IR without success. Patient was started empirically on IV antibiotics for possible meningitis. However, he left the hospital AMA on 08/17/2014. He has not been on antibiotics since, and also reports he has not had fever since. His pain is localized to left anterior thigh it appears worse at left hip. He has some mild tenderness to lumbar spine but no overlying skin changes. It also appears distractible. He did not appear to have any pain when head of bed was lifted, there did appear to have pain with leg was lifted. He states he has weakness of the left leg chronically due to a prior stroke. There no overlying skin changes of the leg. There is no posterior leg pain or swelling. I do not think this pain is likely related to the LP attempts. Also do not think patient is likely to have meningitis given that he is afebrile today and well-appearing. We'll recheck UA, as he has not been on Abx.. Have ordered an x-ray of the right hip which was negative. We'll treat pain, have her f/u closely with PCP. Return precautions given for new or worsening  symptoms including worsening pain, development of fever, numbness, new weakness.  Ernestina Patches, MD 08/30/14 [phone]

## 2014-08-31 LAB — URINE CULTURE
COLONY COUNT: NO GROWTH
Culture: NO GROWTH

## 2014-09-01 ENCOUNTER — Emergency Department (HOSPITAL_COMMUNITY)
Admission: EM | Admit: 2014-09-01 | Discharge: 2014-09-01 | Disposition: A | Discharge status: Home / Self Care | Payer: Medicaid Other | Attending: Emergency Medicine | Admitting: Emergency Medicine

## 2014-09-01 ENCOUNTER — Encounter (HOSPITAL_COMMUNITY): Payer: Self-pay | Admitting: Emergency Medicine

## 2014-09-01 ENCOUNTER — Emergency Department (HOSPITAL_COMMUNITY): Payer: Medicaid Other

## 2014-09-01 DIAGNOSIS — I509 Heart failure, unspecified: Secondary | ICD-10-CM | POA: Diagnosis not present

## 2014-09-01 DIAGNOSIS — Z8673 Personal history of transient ischemic attack (TIA), and cerebral infarction without residual deficits: Secondary | ICD-10-CM | POA: Diagnosis not present

## 2014-09-01 DIAGNOSIS — R5381 Other malaise: Secondary | ICD-10-CM | POA: Diagnosis not present

## 2014-09-01 DIAGNOSIS — Z7982 Long term (current) use of aspirin: Secondary | ICD-10-CM | POA: Diagnosis not present

## 2014-09-01 DIAGNOSIS — I10 Essential (primary) hypertension: Secondary | ICD-10-CM | POA: Insufficient documentation

## 2014-09-01 DIAGNOSIS — Z86011 Personal history of benign neoplasm of the brain: Secondary | ICD-10-CM | POA: Diagnosis not present

## 2014-09-01 DIAGNOSIS — M25569 Pain in unspecified knee: Secondary | ICD-10-CM | POA: Diagnosis present

## 2014-09-01 DIAGNOSIS — M5442 Lumbago with sciatica, left side: Secondary | ICD-10-CM

## 2014-09-01 DIAGNOSIS — E119 Type 2 diabetes mellitus without complications: Secondary | ICD-10-CM | POA: Insufficient documentation

## 2014-09-01 DIAGNOSIS — IMO0002 Reserved for concepts with insufficient information to code with codable children: Secondary | ICD-10-CM | POA: Insufficient documentation

## 2014-09-01 DIAGNOSIS — Z79899 Other long term (current) drug therapy: Secondary | ICD-10-CM | POA: Diagnosis not present

## 2014-09-01 DIAGNOSIS — F17200 Nicotine dependence, unspecified, uncomplicated: Secondary | ICD-10-CM | POA: Insufficient documentation

## 2014-09-01 DIAGNOSIS — R5383 Other fatigue: Secondary | ICD-10-CM

## 2014-09-01 DIAGNOSIS — M5416 Radiculopathy, lumbar region: Secondary | ICD-10-CM

## 2014-09-01 LAB — CBC WITH DIFFERENTIAL/PLATELET
BASOS PCT: 0 % (ref 0–1)
Basophils Absolute: 0 10*3/uL (ref 0.0–0.1)
EOS PCT: 2 % (ref 0–5)
Eosinophils Absolute: 0.2 10*3/uL (ref 0.0–0.7)
HEMATOCRIT: 43.8 % (ref 39.0–52.0)
Hemoglobin: 14.7 g/dL (ref 13.0–17.0)
Lymphocytes Relative: 25 % (ref 12–46)
Lymphs Abs: 2.6 10*3/uL (ref 0.7–4.0)
MCH: 24.4 pg — ABNORMAL LOW (ref 26.0–34.0)
MCHC: 33.6 g/dL (ref 30.0–36.0)
MCV: 72.6 fL — AB (ref 78.0–100.0)
MONO ABS: 0.8 10*3/uL (ref 0.1–1.0)
Monocytes Relative: 7 % (ref 3–12)
NEUTROS ABS: 7 10*3/uL (ref 1.7–7.7)
Neutrophils Relative %: 66 % (ref 43–77)
Platelets: 248 10*3/uL (ref 150–400)
RBC: 6.03 MIL/uL — ABNORMAL HIGH (ref 4.22–5.81)
RDW: 15.4 % (ref 11.5–15.5)
WBC: 10.7 10*3/uL — ABNORMAL HIGH (ref 4.0–10.5)

## 2014-09-01 LAB — PROTIME-INR
INR: 0.91 (ref 0.00–1.49)
Prothrombin Time: 12.3 seconds (ref 11.6–15.2)

## 2014-09-01 LAB — BASIC METABOLIC PANEL
Anion gap: 15 (ref 5–15)
BUN: 14 mg/dL (ref 6–23)
CO2: 23 mEq/L (ref 19–32)
CREATININE: 0.84 mg/dL (ref 0.50–1.35)
Calcium: 9.6 mg/dL (ref 8.4–10.5)
Chloride: 101 mEq/L (ref 96–112)
GFR calc non Af Amer: >90 mL/min (ref >90)
Glucose, Bld: 129 mg/dL — ABNORMAL HIGH (ref 70–99)
Potassium: 4 mEq/L (ref 3.7–5.3)
SODIUM: 139 meq/L (ref 137–147)

## 2014-09-01 MED ORDER — KETOROLAC TROMETHAMINE 30 MG/ML IJ SOLN
30.0000 mg | Freq: Once | INTRAMUSCULAR | Status: AC
  Administered 2014-09-01: 30 mg via INTRAVENOUS
  Filled 2014-09-01: qty 1

## 2014-09-01 MED ORDER — NAPROXEN 500 MG PO TABS: 500.0000 mg | ORAL_TABLET | Freq: Two times a day (BID) | ORAL | Status: DC

## 2014-09-01 MED ORDER — OXYCODONE-ACETAMINOPHEN 5-325 MG PO TABS: 1.0000 | ORAL_TABLET | ORAL | Status: DC | PRN

## 2014-09-01 MED ORDER — OXYCODONE-ACETAMINOPHEN 5-325 MG PO TABS
1.0000 | ORAL_TABLET | Freq: Once | ORAL | Status: AC
  Administered 2014-09-01: 1 via ORAL
  Filled 2014-09-01: qty 1

## 2014-09-01 MED ORDER — HYDROMORPHONE HCL PF 1 MG/ML IJ SOLN
1.0000 mg | Freq: Once | INTRAMUSCULAR | Status: AC
  Administered 2014-09-01: 1 mg via INTRAVENOUS
  Filled 2014-09-01: qty 1

## 2014-09-01 MED ORDER — DIAZEPAM 5 MG/ML IJ SOLN
2.5000 mg | Freq: Once | INTRAMUSCULAR | Status: AC
  Administered 2014-09-01: 2.5 mg via INTRAVENOUS
  Filled 2014-09-01: qty 2

## 2014-09-01 MED ORDER — GADOBENATE DIMEGLUMINE 529 MG/ML IV SOLN
15.0000 mL | Freq: Once | INTRAVENOUS | Status: AC
  Administered 2014-09-01: 14 mL via INTRAVENOUS

## 2014-09-01 NOTE — ED Provider Notes (Signed)
Medical screening examination/treatment/procedure(s) were performed by non-physician practitioner and as supervising physician I was immediately available for consultation/collaboration.   EKG Interpretation None       Kalman Drape, MD 09/01/14 [phone]

## 2014-09-01 NOTE — Discharge Instructions (Signed)
Naproxen and percocet for pain. Please follow up with neurosurgery as referred. Call today for an appointment.   Herniated Disk A herniated disk occurs when a disk in your spine bulges out too far. This condition is also called a ruptured disk or slipped disk. Your spine (backbone) is made up of bones called vertebrae. Between each pair of vertebrae is an oval disk with a soft, spongy center that acts as a shock absorber when you move. The spongy center is surrounded by a tough outer ring. When you have a herniated disk, the spongy center of the disk bulges out or ruptures through the outer ring. A herniated disk can press on a nerve between your vertebrae and cause pain. A herniated disk can occur anywhere in your back or neck area, but the lower back is the most common spot. CAUSES  In many cases, a herniated disk occurs just from getting older. As you age, the spongy insides of your disks tend to shrink and dry out. A herniated disk can result from gradual wear and tear. Injury or sudden strain can also cause a herniated disk.  RISK FACTORS Aging is the main risk factor for a herniated disk. Other risk factors include:  Being a man between the ages of 31 and 67 years.  Having a job that requires heavy lifting, bending, or twisting.  Having a job that requires long hours of driving.  Not getting enough exercise.  Being overweight.  Smoking. SIGNS AND SYMPTOMS  Signs and symptoms depend on which disk is herniated.  For a herniated disk in the lower back, you may have sharp pain in:  One part of your leg, hip, or buttocks.  The back of your calf.  The top or sole of your foot (sciatica).   For a herniated disk in the neck, you may feel pain:  When you move your neck.  Near or over your shoulder blade.  That moves to your upper arm, forearm, or fingers.   You may also have muscle weakness. It may be hard to:  Lift your leg or arm.  Stand on your toes.  Squeeze tightly  with one of your hands.  Other symptoms can include:  Numbness or tingling in the affected areas of your body.  Loss of bladder or bowel control. This is a rare but serious sign of a severe herniated disk in the lower back. DIAGNOSIS  Your health care provider will do a physical exam. During this exam, you may have to move certain body parts or assume various positions. For example, your health care provider may do the straight-leg test. This is a good way to test for a herniated disk in your lower back. In this test, the health care provider lifts your leg while you lie on your back. This is to see if you feel pain down your leg. Your health care provider will also check for numbness or loss of feeling.  Your health care provider will also check your:  Reflexes.  Muscle strength.  Posture.  Other tests may be done to help in making a diagnosis. These may include:  An X-ray of the spine to rule out other causes of back pain.   Other imaging studies, such as an MRI or CT scan. This is to check whether the herniated disk is pressing on your spinal canal.  Electromyography (EMG). This test checks the nerves that control muscles. It is sometimes used to identify the specific area of nerve involvement.  TREATMENT  In many cases, herniated disk symptoms go away over a period of days or weeks. You will most likely be free of symptoms in 3-4 months. Treatment may include the following:  The initial treatment for a herniated disk is ashort period of rest.  Bed rest is often limited to 1 or 2 days. Resting for too long delays recovery.  If you have a herniated disk in your lower back, you should avoid sitting as much as possible because sitting increases pressure on the disk.  Medicines. These may include:   Nonsteroidal anti-inflammatory drugs (NSAIDs).  Muscle relaxants for back spasms.  Narcotic pain medicine if your pain is very bad.   Steroid injections. You may need these  along the involved nerve root to help control pain. The steroid is injected in the area of the herniated disk. It helps by reducing swelling around the disk.  Physical therapy. This may include exercises to strengthen the muscles that help support your spine.   You may need surgery if other treatments do not work.  HOME CARE INSTRUCTIONS Follow all your health care provider's instructions. These may include:  Take all medicines as directed by your health care provider.  Rest for 2 days and then start moving.  Do not sit or stand for long periods of time.  Maintain good posture when sitting and standing.  Avoid movements that cause pain, such as bending or lifting.  When you are able to start lifting things again:  [place] with your knees.  Keep your back straight.  Hold heavy objects close to your body.  If you are overweight, ask your health care provider to help you start a weight-loss program.  When you are able to start exercising, ask your health care provider how much and what type of exercise is best for you.  Work with a physical therapist on stretching and strengthening exercises for your back.  Do not wear high-heeled shoes.  Do not sleep on your belly.  Do not smoke.  Keep all follow-up visits as directed by your health care provider. SEEK MEDICAL CARE IF:  You have back or neck pain that is not getting better after 4 weeks.  You have very bad pain in your back or neck.  You develop numbness, tingling, or weakness along with pain. SEEK IMMEDIATE MEDICAL CARE IF:   You have numbness, tingling, or weakness that makes you unable to use your arms or legs.  You lose control of your bladder or bowels.  You have dizziness or fainting.  You have shortness of breath.  MAKE SURE YOU:   Understand these instructions.  Will watch your condition.  Will get help right away if you are not doing well or get worse. Document Released: 12/12/2000 Document  Revised: 05/01/2014 Document Reviewed: 11/18/2013 [facility] Patient Information 2015 [place], [place]. This information is not intended to replace advice given to you by your health care provider. Make sure you discuss any questions you have with your health care provider.

## 2014-09-01 NOTE — ED Notes (Signed)
Per EMS: pt reports left knee pain, was seen here yesterday, negative xrays and prescribed Tramadol with no relief. Reports pain is worse this morning. A&Ox4, ambulatory. VS BP-190/90, HR-88, RR-20, 98% RA.

## 2014-09-01 NOTE — ED Notes (Signed)
Pt was seen here on 08/30/14 and had a hip x-ray performed, which was negative. Pt is here tonight and reports left knee pain.

## 2014-09-01 NOTE — ED Provider Notes (Signed)
Medical screening examination/treatment/procedure(s) were performed by non-physician practitioner and as supervising physician I was immediately available for consultation/collaboration.   EKG Interpretation None       [name], MD 09/01/14 1727

## 2014-09-01 NOTE — ED Provider Notes (Signed)
CSN: [id number]     Arrival date & time 09/01/14  9024 History   First MD Initiated Contact with Patient 09/01/14 [phone]     Chief Complaint  Patient presents with  . Knee Pain     (Consider location/radiation/quality/duration/timing/severity/associated sxs/prior Treatment) HPI Comments: Patient is a 63 year old male with history of hypertension, diabetes mellitus, CVA, and CHF. He presents to the emergency department for further evaluation of left lower extremity pain. Patient states that he has been experiencing pain x2 days. Pain is worsening and radiating from his low back down his left lower extremity. Patient states the pain is sharp in nature and not relieved by tramadol. Pain is worse with ambulation and he endorses subjective weakness in his LLE. He denies associated fever, incontinence, numbness, hx of CA, hx IVDU. He denies a hx of similar back pain.   Patient was evaluated for symptoms on 08/30/2014 and had a left hip x-ray completed which was unremarkable. He has a history of admission for fever and chest pain on 08/15/2014. During this admission, patient had a lumbar puncture completed; procedure was noted to be difficult and required multiple sticks.   Patient is a 63 y.o. male presenting with knee pain. The history is provided by the patient. No language interpreter was used.  Knee Pain Associated symptoms: back pain   Associated symptoms: no fever     Past Medical History  Diagnosis Date  . Hypertension   . Diabetes mellitus   . Brain tumor   . CVA (cerebral vascular accident)   . Narcotic abuse   . CHF (congestive heart failure)    History reviewed. No pertinent past surgical history. Family History  Problem Relation Age of Onset  . CAD Father   . Diabetes Brother   . Diabetes Sister   . Hypertension Father   . Hypertension Mother    History  Substance Use Topics  . Smoking status: Current Every Day Smoker  . Smokeless tobacco: Not on file  . Alcohol Use: Yes      Comment: 6 pack a week    Review of Systems  Constitutional: Negative for fever.  Gastrointestinal: Negative for vomiting and diarrhea.  Genitourinary:       No incontinence  Musculoskeletal: Positive for arthralgias and back pain.  Neurological: Positive for weakness (subjective; LLE). Negative for numbness.  All other systems reviewed and are negative.    Allergies  Review of patient's allergies indicates no known allergies.  Home Medications   Prior to Admission medications   Medication Sig Start Date End Date Taking? Authorizing Provider  acetaminophen (TYLENOL) 500 MG tablet Take 500 mg by mouth every 6 (six) hours as needed for headache.    Historical Provider, MD  amLODipine (NORVASC) 10 MG tablet Take 10 mg by mouth daily.    Historical Provider, MD  aspirin EC 81 MG tablet Take 81 mg by mouth daily.    Historical Provider, MD  b complex vitamins tablet Take 1 tablet by mouth daily.    Historical Provider, MD  hydrALAZINE (APRESOLINE) 50 MG tablet Take 1 tablet (50 mg total) by mouth every 8 (eight) hours. 12/27/13   Debbe Odea, MD  isosorbide dinitrate (ISORDIL) 10 MG tablet Take 1 tablet (10 mg total) by mouth 3 (three) times daily. 12/27/13   Debbe Odea, MD  metFORMIN (GLUCOPHAGE) 500 MG tablet Take 500 mg by mouth 2 (two) times daily with a meal.    Historical Provider, MD  oxyCODONE-acetaminophen (PERCOCET/ROXICET) 5-325 MG per tablet  Take 1 tablet by mouth every 4 (four) hours as needed for severe pain.    Historical Provider, MD  pravastatin (PRAVACHOL) 20 MG tablet Take 20 mg by mouth at bedtime.    Historical Provider, MD  traMADol (ULTRAM) 50 MG tablet Take 50 mg by mouth every 6 (six) hours as needed for moderate pain. 12/15/13   Virgel Manifold, MD  traMADol (ULTRAM) 50 MG tablet Take 1 tablet (50 mg total) by mouth every 6 (six) hours as needed. 08/30/14   Ernestina Patches, MD   BP 192/83  Pulse 65  Temp(Src) 98.2 F (36.8 C) (Oral)  Resp 18  SpO2  97%  Physical Exam  Nursing note and vitals reviewed. Constitutional: He is oriented to person, place, and time. He appears well-developed and well-nourished. No distress.  Patient swaying around in exam room bed, in obvious discomfort. Nontoxic/nonseptic appearing.  HENT:  Head: Normocephalic and atraumatic.  Eyes: Conjunctivae and EOM are normal. No scleral icterus.  Neck: Normal range of motion.  Cardiovascular: Normal rate, regular rhythm and intact distal pulses.   DP and PT pulses 2+ in LLE  Pulmonary/Chest: Effort normal. No respiratory distress. He has no wheezes.  Chest expansion symmetric  Musculoskeletal: He exhibits tenderness.  TTP to lumbar midline without bony deformities, step-off, or crepitus. No erythema or heat to touch. Tenderness to palpation of left lumbar paraspinal muscles without spasm. Patient endorses tenderness to palpation diffusely throughout left lower extremity; however, appears to be in most discomfort when moving left leg in any position.  Neurological: He is alert and oriented to person, place, and time. He exhibits normal muscle tone. Coordination normal.  Sensation to light touch intact in b/l lower extremities. Patellar and achilles reflexes intact in LLE.  Skin: Skin is warm and dry. No rash noted. He is not diaphoretic. No erythema. No pallor.  Psychiatric: He has a normal mood and affect. His behavior is normal.    ED Course  Procedures (including critical care time) Labs Review Labs Reviewed  CBC WITH DIFFERENTIAL - Abnormal; Notable for the following:    WBC 10.7 (*)    RBC 6.03 (*)    MCV 72.6 (*)    MCH 24.4 (*)    All other components within normal limits  BASIC METABOLIC PANEL - Abnormal; Notable for the following:    Glucose, Bld 129 (*)    All other components within normal limits  PROTIME-INR    Imaging Review Dg Hip Complete Left  08/30/2014   CLINICAL DATA:  Left hip pain.  EXAM: LEFT HIP - COMPLETE 2+ VIEW  COMPARISON:  None.   FINDINGS: Hip joint space is normal. No subchondral sclerosis or cyst formation. No osteophytosis. Obturator rings are intact.  IMPRESSION: No acute or chronic findings in the hips to explain the patient's pain.   Electronically Signed   By: Lorin Picket M.D.   On: 08/30/2014 12:15     EKG Interpretation None      MDM   Final diagnoses:  Left-sided low back pain with left-sided sciatica    63 year old male presents to the emergency department for persistent left low back and left lower extremity pain x 4 days. Patient with a history of hospitalization on 08/15/2014 at which time he had a lumbar puncture completed; procedure was noted to be difficulty and required multiple sticks. Patient neurovascularly intact on exam today. No gross sensory deficits appreciated. He is afebrile and hemodynamically stable. Dilaudid and Valium ordered for symptom management.  Patient with  MR lumbar spine with and without contrast pending to evaluate for epidural abscess or hematoma. Patient signed out to Jeannett Senior, PA-C at shift change will followup on imaging and disposition appropriately.   Filed Vitals:   09/01/14 0430 09/01/14 0500 09/01/14 0530 09/01/14 0600  BP: 178/81 184/80 179/74 192/83  Pulse: 69 68 74 65  Temp:      TempSrc:      Resp:      SpO2: 98% 97% 95% 97%       Antonietta Breach, PA-C 09/01/14 [phone]

## 2014-09-01 NOTE — ED Notes (Signed)
Patient in NAD at time of d/c, out with family member

## 2014-09-01 NOTE — ED Provider Notes (Signed)
Pt signed out to me at shift change pending MRI of lumbar spine. Pt with progressive back pain and new pain going down left leg into left knee. Recent LP. Pain seems to be worse since then. Pt receiving dilaudid for pain here.   Results for orders placed during the hospital encounter of 09/01/14  [facility]      Result Value Ref Range   Prothrombin Time 12.3  11.6 - 15.2 seconds   INR 0.91  0.00 - 1.49  CBC WITH DIFFERENTIAL      Result Value Ref Range   WBC 10.7 (*) 4.0 - 10.5 K/uL   RBC 6.03 (*) 4.22 - 5.81 MIL/uL   Hemoglobin 14.7  13.0 - 17.0 g/dL   HCT 43.8  39.0 - 52.0 %   MCV 72.6 (*) 78.0 - 100.0 fL   MCH 24.4 (*) 26.0 - 34.0 pg   MCHC 33.6  30.0 - 36.0 g/dL   RDW 15.4  11.5 - 15.5 %   Platelets 248  150 - 400 K/uL   Neutrophils Relative % 66  43 - 77 %   Neutro Abs 7.0  1.7 - 7.7 K/uL   Lymphocytes Relative 25  12 - 46 %   Lymphs Abs 2.6  0.7 - 4.0 K/uL   Monocytes Relative 7  3 - 12 %   Monocytes Absolute 0.8  0.1 - 1.0 K/uL   Eosinophils Relative 2  0 - 5 %   Eosinophils Absolute 0.2  0.0 - 0.7 K/uL   Basophils Relative 0  0 - 1 %   Basophils Absolute 0.0  0.0 - 0.1 K/uL  BASIC METABOLIC PANEL      Result Value Ref Range   Sodium 139  137 - 147 mEq/L   Potassium 4.0  3.7 - 5.3 mEq/L   Chloride 101  96 - 112 mEq/L   CO2 23  19 - 32 mEq/L   Glucose, Bld 129 (*) 70 - 99 mg/dL   BUN 14  6 - 23 mg/dL   Creatinine, Ser 0.84  0.50 - 1.35 mg/dL   Calcium 9.6  8.4 - 10.5 mg/dL   GFR calc non Af Amer >90  >90 mL/min   GFR calc Af Amer >90  >90 mL/min   Anion gap 15  5 - 15   Dg Hip Complete Left  08/30/2014   CLINICAL DATA:  Left hip pain.  EXAM: LEFT HIP - COMPLETE 2+ VIEW  COMPARISON:  None.  FINDINGS: Hip joint space is normal. No subchondral sclerosis or cyst formation. No osteophytosis. Obturator rings are intact.  IMPRESSION: No acute or chronic findings in the hips to explain the patient's pain.   Electronically Signed   By: Lorin Picket M.D.   On: 08/30/2014  12:15     Mr Lumbar Spine W Wo Contrast  09/01/2014   CLINICAL DATA:  Low back pain. Left hip and knee pain. Recent lumbar puncture.  EXAM: MRI LUMBAR SPINE WITHOUT AND WITH CONTRAST  TECHNIQUE: Multiplanar and multiecho pulse sequences of the lumbar spine were obtained without and with intravenous contrast.  CONTRAST:  90mL MULTIHANCE GADOBENATE DIMEGLUMINE 529 MG/ML IV SOLN  COMPARISON:  06/18/2010  FINDINGS: Heterogeneous marrow signal which is similar to previous. No discrete marrow signal abnormality suggestive of neoplasm. No marrow signal abnormality suggestive of fracture or infection. Normal conus signal and morphology.  Distended urinary bladder with thick trabeculated wall and diverticula.  No abnormal intrathecal enhancement. Sagittal fat sat postcontrast imaging is suboptimal due  to incomplete coverage of the right spine. Spinal coverage on axial postcontrast imaging is good, such that return and repeat injection not necessary.  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 Superimposed degenerative changes:  L2-L3: Disc narrowing and desiccation. Bulging effaces the inferior foramina without nerve compression, stable from previous.  L3-L4: Newly seen annular fissure in the posterior disc. Disc narrowing and circumferential disc bulging has progressed, with further effacement inferior foramina. Posterior disc bulging flattens the ventral thecal sac.  L4-L5: Mild degenerative facet overgrowth. Mild bulging at the level of the foramina, without nerve compression.  L5-S1:No nerve impingement.  IMPRESSION: 1. 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 2. No abnormal enhancement to suggest complication of recent lumbar puncture. 3. 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   Electronically Signed   By: Jorje Guild M.D.   On: 09/01/2014 08:16     8:41 AM MR lumbar spine negative except  for bilateral foramina stenosis at L3-L4 level. PT continues to have pain. He is neurovascularly intact. No signs of cauda equina. Will dc home with pain medications and close neurosurgery follow up.   Filed Vitals:   09/01/14 0500 09/01/14 0530 09/01/14 0600 09/01/14 0747  BP: 184/80 179/74 192/83 159/85  Pulse: 68 74 65 63  Temp:    98.7 F (37.1 C)  TempSrc:    Oral  Resp:    16  SpO2: 97% 95% 97% 98%     Renold Genta, PA-C 09/01/14 [phone]

## 2014-10-15 ENCOUNTER — Encounter (HOSPITAL_COMMUNITY): Payer: Self-pay | Admitting: Emergency Medicine

## 2014-10-15 ENCOUNTER — Emergency Department (HOSPITAL_COMMUNITY): Payer: Medicaid Other

## 2014-10-15 ENCOUNTER — Emergency Department (HOSPITAL_COMMUNITY)
Admission: EM | Admit: 2014-10-15 | Discharge: 2014-10-15 | Disposition: A | Discharge status: Home / Self Care | Payer: Medicaid Other | Attending: Emergency Medicine | Admitting: Emergency Medicine

## 2014-10-15 DIAGNOSIS — I10 Essential (primary) hypertension: Secondary | ICD-10-CM | POA: Diagnosis not present

## 2014-10-15 DIAGNOSIS — Z8669 Personal history of other diseases of the nervous system and sense organs: Secondary | ICD-10-CM | POA: Diagnosis not present

## 2014-10-15 DIAGNOSIS — N390 Urinary tract infection, site not specified: Secondary | ICD-10-CM | POA: Diagnosis not present

## 2014-10-15 DIAGNOSIS — E119 Type 2 diabetes mellitus without complications: Secondary | ICD-10-CM | POA: Diagnosis not present

## 2014-10-15 DIAGNOSIS — R05 Cough: Secondary | ICD-10-CM | POA: Insufficient documentation

## 2014-10-15 DIAGNOSIS — Z8673 Personal history of transient ischemic attack (TIA), and cerebral infarction without residual deficits: Secondary | ICD-10-CM | POA: Diagnosis not present

## 2014-10-15 DIAGNOSIS — Z720 Tobacco use: Secondary | ICD-10-CM | POA: Diagnosis not present

## 2014-10-15 DIAGNOSIS — Z79899 Other long term (current) drug therapy: Secondary | ICD-10-CM | POA: Insufficient documentation

## 2014-10-15 DIAGNOSIS — R0602 Shortness of breath: Secondary | ICD-10-CM | POA: Diagnosis not present

## 2014-10-15 DIAGNOSIS — R0789 Other chest pain: Secondary | ICD-10-CM | POA: Insufficient documentation

## 2014-10-15 DIAGNOSIS — Z7982 Long term (current) use of aspirin: Secondary | ICD-10-CM | POA: Diagnosis not present

## 2014-10-15 DIAGNOSIS — R51 Headache: Secondary | ICD-10-CM | POA: Insufficient documentation

## 2014-10-15 DIAGNOSIS — G8929 Other chronic pain: Secondary | ICD-10-CM

## 2014-10-15 DIAGNOSIS — R519 Headache, unspecified: Secondary | ICD-10-CM

## 2014-10-15 DIAGNOSIS — M549 Dorsalgia, unspecified: Secondary | ICD-10-CM | POA: Diagnosis not present

## 2014-10-15 DIAGNOSIS — R079 Chest pain, unspecified: Secondary | ICD-10-CM | POA: Diagnosis present

## 2014-10-15 DIAGNOSIS — I509 Heart failure, unspecified: Secondary | ICD-10-CM | POA: Insufficient documentation

## 2014-10-15 LAB — COMPREHENSIVE METABOLIC PANEL
ALBUMIN: 3.7 g/dL (ref 3.5–5.2)
ALT: 41 U/L (ref 0–53)
AST: 54 U/L — AB (ref 0–37)
Alkaline Phosphatase: 69 U/L (ref 39–117)
Anion gap: 14 (ref 5–15)
BILIRUBIN TOTAL: 0.3 mg/dL (ref 0.3–1.2)
BUN: 14 mg/dL (ref 6–23)
CHLORIDE: 101 meq/L (ref 96–112)
CO2: 23 mEq/L (ref 19–32)
CREATININE: 0.79 mg/dL (ref 0.50–1.35)
Calcium: 9.2 mg/dL (ref 8.4–10.5)
GFR calc Af Amer: >90 mL/min (ref >90)
GFR calc non Af Amer: >90 mL/min (ref >90)
Glucose, Bld: 116 mg/dL — ABNORMAL HIGH (ref 70–99)
Potassium: 4.5 mEq/L (ref 3.7–5.3)
SODIUM: 138 meq/L (ref 137–147)
Total Protein: 7.7 g/dL (ref 6.0–8.3)

## 2014-10-15 LAB — I-STAT TROPONIN, ED
Troponin i, poc: 0 ng/mL (ref 0.00–0.08)
Troponin i, poc: 0.01 ng/mL (ref 0.00–0.08)

## 2014-10-15 LAB — CBC WITH DIFFERENTIAL/PLATELET
BASOS ABS: 0.1 10*3/uL (ref 0.0–0.1)
BASOS PCT: 1 % (ref 0–1)
Eosinophils Absolute: 0.4 10*3/uL (ref 0.0–0.7)
Eosinophils Relative: 4 % (ref 0–5)
HCT: 39.4 % (ref 39.0–52.0)
Hemoglobin: 12.8 g/dL — ABNORMAL LOW (ref 13.0–17.0)
Lymphocytes Relative: 30 % (ref 12–46)
Lymphs Abs: 3 10*3/uL (ref 0.7–4.0)
MCH: 23.9 pg — ABNORMAL LOW (ref 26.0–34.0)
MCHC: 32.5 g/dL (ref 30.0–36.0)
MCV: 73.5 fL — ABNORMAL LOW (ref 78.0–100.0)
Monocytes Absolute: 0.7 10*3/uL (ref 0.1–1.0)
Monocytes Relative: 7 % (ref 3–12)
NEUTROS PCT: 58 % (ref 43–77)
Neutro Abs: 5.8 10*3/uL (ref 1.7–7.7)
PLATELETS: 296 10*3/uL (ref 150–400)
RBC: 5.36 MIL/uL (ref 4.22–5.81)
RDW: 15.2 % (ref 11.5–15.5)
WBC: 10 10*3/uL (ref 4.0–10.5)

## 2014-10-15 LAB — URINE MICROSCOPIC-ADD ON

## 2014-10-15 LAB — URINALYSIS, ROUTINE W REFLEX MICROSCOPIC
Bilirubin Urine: NEGATIVE
GLUCOSE, UA: NEGATIVE mg/dL
Ketones, ur: NEGATIVE mg/dL
Nitrite: NEGATIVE
PROTEIN: NEGATIVE mg/dL
Specific Gravity, Urine: 1.015 (ref 1.005–1.030)
UROBILINOGEN UA: 1 mg/dL (ref 0.0–1.0)
pH: 5.5 (ref 5.0–8.0)

## 2014-10-15 LAB — PRO B NATRIURETIC PEPTIDE: Pro B Natriuretic peptide (BNP): 73 pg/mL (ref 0–125)

## 2014-10-15 MED ORDER — GI COCKTAIL ~~LOC~~
30.0000 mL | Freq: Once | ORAL | Status: AC
  Administered 2014-10-15: 30 mL via ORAL
  Filled 2014-10-15: qty 30

## 2014-10-15 MED ORDER — NITROFURANTOIN MONOHYD MACRO 100 MG PO CAPS: 100.0000 mg | ORAL_CAPSULE | Freq: Two times a day (BID) | ORAL | Status: DC

## 2014-10-15 MED ORDER — ASPIRIN 81 MG PO CHEW: 324.0000 mg | CHEWABLE_TABLET | Freq: Once | ORAL | Status: DC

## 2014-10-15 MED ORDER — OXYCODONE-ACETAMINOPHEN 5-325 MG PO TABS
2.0000 | ORAL_TABLET | Freq: Once | ORAL | Status: AC
  Administered 2014-10-15: 2 via ORAL
  Filled 2014-10-15: qty 2

## 2014-10-15 MED ORDER — IBUPROFEN 800 MG PO TABS
800.0000 mg | ORAL_TABLET | Freq: Once | ORAL | Status: AC
  Administered 2014-10-15: 800 mg via ORAL
  Filled 2014-10-15: qty 1

## 2014-10-15 MED ORDER — SODIUM CHLORIDE 0.9 % IV BOLUS (SEPSIS)
500.0000 mL | Freq: Once | INTRAVENOUS | Status: AC
  Administered 2014-10-15: 500 mL via INTRAVENOUS

## 2014-10-15 MED ORDER — HYDROMORPHONE HCL 1 MG/ML IJ SOLN
0.5000 mg | Freq: Once | INTRAMUSCULAR | Status: AC
  Administered 2014-10-15: 0.5 mg via INTRAVENOUS
  Filled 2014-10-15: qty 1

## 2014-10-15 MED ORDER — ALBUTEROL SULFATE HFA 108 (90 BASE) MCG/ACT IN AERS
2.0000 | INHALATION_SPRAY | Freq: Once | RESPIRATORY_TRACT | Status: AC
  Administered 2014-10-15: 2 via RESPIRATORY_TRACT
  Filled 2014-10-15: qty 6.7

## 2014-10-15 NOTE — ED Notes (Signed)
Pt brought to ED by [facility] from home c/o 8/10 chest pain, HA 9/10 and SOB, pt state he is been having a persistent cough that make him hurt more.

## 2014-10-15 NOTE — Discharge Instructions (Signed)
Return to the emergency room with worsening of symptoms, new symptoms or with symptoms that are concerning , especially severe worsening of headache, visual or speech changes, weakness in face, arms or legs  OR fevers, loss of control of bladder or bowels, numbness or tingling around genital region or anus, worsening weakness  OR chest pain that feels like a pressure, spreads to left arm or jaw, worse with exertion, associated with nausea, vomiting, shortness of breath and/or sweating.  Please call your doctor for a followup appointment within 24-48 hours. When you talk to your doctor please let them know that you were seen in the emergency department and have them acquire all of your records so that they can discuss the findings with you and formulate a treatment plan to fully care for your new and ongoing problems. If you do not have a primary care provider please call the number below under ED resources to establish care with a provider and follow up.  Call to make appointment with a neurosurgeon for your chronic back pain and chronic headaches. Call number above.  Call to make appointment with heart doctor for further work up of your chest pain. Call number above for appointment as soon as possible.

## 2014-10-15 NOTE — ED Notes (Signed)
Missed IV access attempt.

## 2014-10-15 NOTE — ED Provider Notes (Signed)
CSN: [id number]     Arrival date & time 10/15/14  7858 History   First MD Initiated Contact with Patient 10/15/14 0703     Chief Complaint  Patient presents with  . Chest Pain  . Shortness of Breath     (Consider location/radiation/quality/duration/timing/severity/associated sxs/prior Treatment) HPI [name] is a 63 y.o. male with PMH of HTN, DM, CVA, CHF presenting with left sided squeezing chest pain that radiates into left arm. This started around 6am this morning, waking him from sleep. Worse with deep breathing and cough. Patient took 4 81mg  aspirin without relief. Denies nausea, vomiting, diaphoresis. Patient notes dry cough this morning as well with mild shortness of breath. Patient with history of cocaine use one month ago but denies use today. Patient presented with similar chest pain 08/15/14 with CTA chest/abd/pelvis without dissection or aneurysm. Troponin x3 negative. Patient also reports headache that started this morning, gradual onset but worse in intensity that previous HA. No visual changes, slurred speech or new weakness. Patient with chronic headaches and has pituitary tumor and normal head CT 08/15/14. Patient has chronic back pain with MRI 08/2014 with worsening foraminal stenosis at L3-L4. Worse on left side. Patient to follow up with Dr. Saintclair Halsted with neurosurgery but pt doesn't report going to see neurosurgery. No fevers, chills, night sweats, reports 20 ib weight loss in 2 years, denies IVDU. No urinary or bowel incontinence. No saddle anesthesia. Patient reports weakness on left side which is chronic.  Past Medical History  Diagnosis Date  . Hypertension   . Diabetes mellitus   . Brain tumor   . CVA (cerebral vascular accident)   . Narcotic abuse   . CHF (congestive heart failure)    History reviewed. No pertinent past surgical history. Family History  Problem Relation Age of Onset  . CAD Father   . Diabetes Brother   . Diabetes Sister   . Hypertension Father    . Hypertension Mother    History  Substance Use Topics  . Smoking status: Current Every Day Smoker  . Smokeless tobacco: Current User  . Alcohol Use: Yes     Comment: 6 pack a week    Review of Systems  Constitutional: Negative for fever and chills.  HENT: Negative for congestion and rhinorrhea.   Eyes: Negative for visual disturbance.  Respiratory: Positive for cough and shortness of breath.   Cardiovascular: Positive for chest pain. Negative for palpitations and leg swelling.  Gastrointestinal: Negative for nausea, vomiting and diarrhea.  Genitourinary: Negative for dysuria and hematuria.  Musculoskeletal: Positive for back pain. Negative for gait problem.  Skin: Negative for rash.  Neurological: Positive for headaches. Negative for weakness.      Allergies  Review of patient's allergies indicates no known allergies.  Home Medications   Prior to Admission medications   Medication Sig Start Date End Date Taking? Authorizing Provider  acetaminophen (TYLENOL) 500 MG tablet Take 500 mg by mouth every 6 (six) hours as needed for headache.   Yes Historical Provider, MD  amLODipine (NORVASC) 10 MG tablet Take 10 mg by mouth daily.   Yes Historical Provider, MD  aspirin EC 81 MG tablet Take 81 mg by mouth daily.   Yes Historical Provider, MD  b complex vitamins tablet Take 1 tablet by mouth daily.   Yes Historical Provider, MD  hydrALAZINE (APRESOLINE) 50 MG tablet Take 1 tablet (50 mg total) by mouth every 8 (eight) hours. 12/27/13  Yes Debbe Odea, MD  isosorbide dinitrate (ISORDIL)  10 MG tablet Take 1 tablet (10 mg total) by mouth 3 (three) times daily. 12/27/13  Yes Debbe Odea, MD  metFORMIN (GLUCOPHAGE) 500 MG tablet Take 500 mg by mouth 2 (two) times daily with a meal.   Yes Historical Provider, MD  oxyCODONE-acetaminophen (PERCOCET) 5-325 MG per tablet Take 1 tablet by mouth every 4 (four) hours as needed for severe pain. 09/01/14  Yes Tatyana A Kirichenko, PA-C   pravastatin (PRAVACHOL) 20 MG tablet Take 20 mg by mouth at bedtime.   Yes Historical Provider, MD  nitrofurantoin, macrocrystal-monohydrate, (MACROBID) 100 MG capsule Take 1 capsule (100 mg total) by mouth 2 (two) times daily. 10/15/14   Pura Spice, PA-C  traMADol (ULTRAM) 50 MG tablet Take 1 tablet (50 mg total) by mouth every 6 (six) hours as needed. 08/30/14   Ernestina Patches, MD   BP 160/69  Pulse 75  Temp(Src) 98.3 F (36.8 C) (Oral)  Resp 22  Ht 5\' 9"  (1.753 m)  Wt 163 lb (73.936 kg)  BMI 24.06 kg/m2  SpO2 96% Physical Exam  Nursing note and vitals reviewed. Constitutional: He appears well-developed and well-nourished. No distress.  HENT:  Head: Normocephalic and atraumatic.  Eyes: Conjunctivae and EOM are normal. Right eye exhibits no discharge. Left eye exhibits no discharge.  Neck: Normal range of motion. No JVD present.  Cardiovascular: Normal rate, regular rhythm, normal heart sounds and intact distal pulses.   No leg swelling or tenderness. Negative Homan's sign. Chest pain reproducible.    Pulmonary/Chest: Effort normal and breath sounds normal. No respiratory distress. He has no wheezes.  Abdominal: Soft. Bowel sounds are normal. He exhibits no distension. There is no tenderness.  Musculoskeletal:  Pt withdrawals with light tough to Midline back, bilateral lower back and left leg. No back step off or crepitus. No CVA tenderness. Decreased muscle tone on left.  4/5 strength in L lower extremities and 5/5 strength in right lower extremities. DTR decreased on left, intact. Antalgic gait.   Neurological: He is alert. He exhibits normal muscle tone. Coordination normal.  Skin: Skin is warm and dry. He is not diaphoretic.    ED Course  Procedures (including critical care time) Labs Review Labs Reviewed  CBC WITH DIFFERENTIAL - Abnormal; Notable for the following:    Hemoglobin 12.8 (*)    MCV 73.5 (*)    MCH 23.9 (*)    All other components within normal limits   COMPREHENSIVE METABOLIC PANEL - Abnormal; Notable for the following:    Glucose, Bld 116 (*)    AST 54 (*)    All other components within normal limits  URINALYSIS, ROUTINE W REFLEX MICROSCOPIC - Abnormal; Notable for the following:    Hgb urine dipstick MODERATE (*)    Leukocytes, UA MODERATE (*)    All other components within normal limits  URINE MICROSCOPIC-ADD ON - Abnormal; Notable for the following:    Bacteria, UA FEW (*)    All other components within normal limits  URINE CULTURE  PRO B NATRIURETIC PEPTIDE  I-STAT TROPOININ, ED  I-STAT TROPOININ, ED    Imaging Review Dg Chest 2 View  10/15/2014   CLINICAL DATA:  Chest pain and shortness of breath with headache.  EXAM: CHEST  2 VIEW  COMPARISON:  08/15/2014 and 12/15/2013  FINDINGS: Lungs are adequately inflated without consolidation or effusion. Cardiomediastinal silhouette and remainder the exam is unremarkable.  IMPRESSION: No active cardiopulmonary disease.   Electronically Signed   By: Marin Olp M.D.  On: 10/15/2014 08:19     EKG Interpretation   Date/Time:  Sunday October 15 2014 06:44:56 EDT Ventricular Rate:  71 PR Interval:  156 QRS Duration: 83 QT Interval:  423 QTC Calculation: 460 R Axis:   -2 Text Interpretation:  Sinus rhythm Probable left atrial enlargement No  significant change since last tracing Confirmed by Maryan Rued  MD, Loree Fee  [phone]) on 10/15/2014 8:08:35 AM     Meds given in ED:  Medications  oxyCODONE-acetaminophen (PERCOCET/ROXICET) 5-325 MG per tablet 2 tablet (2 tablets Oral Given 10/15/14 0811)  albuterol (PROVENTIL HFA;VENTOLIN HFA) 108 (90 BASE) MCG/ACT inhaler 2 puff (2 puffs Inhalation Given 10/15/14 0953)  ibuprofen (ADVIL,MOTRIN) tablet 800 mg (800 mg Oral Given 10/15/14 0952)  sodium chloride 0.9 % bolus 500 mL (0 mLs Intravenous Stopped 10/15/14 1152)  gi cocktail (Maalox,Lidocaine,Donnatal) (30 mLs Oral Given 10/15/14 1105)  HYDROmorphone (DILAUDID) injection 0.5 mg  (0.5 mg Intravenous Given 10/15/14 1107)    Discharge Medication List as of 10/15/2014 11:42 AM    START taking these medications   Details  nitrofurantoin, macrocrystal-monohydrate, (MACROBID) 100 MG capsule Take 1 capsule (100 mg total) by mouth 2 (two) times daily., Starting 10/15/2014, Until Discontinued, Print          MDM   Final diagnoses:  Other chest pain  UTI (lower urinary tract infection)  Chronic back pain  Acute nonintractable headache, unspecified headache type   VSS, no JVD or new murmur, RRR, breath sounds equal bilaterally. CP reproducible on exam.  EKG without acute abnormalities, negative troponin and delta troponin, and negative CXR. Chest pain is not likely of cardiac or pulmonary etiology.  Pt has been advised to return to the ED if CP becomes exertional, associated with diaphoresis or nausea, radiates to left jaw/arm, worsens or becomes concerning in any way. Pt to follow up with cardiology for outpatient evaluation due to patients risk factors.  Pt has been diagnosed with a UTI. Pt with Chronic bladder outlet obstruction. Pt is afebrile, no CVA tenderness, normotensive, and denies N/V. Prior urine cultures sensitive to macrobid. Pt to be dc home with antibiotics. Pt to follow up with PCP in 3 days for this and management of all his chronic problems. Patient with chronic back pain with MRI in September with bilateral worsening foraminal stenosis at L3-L4. Back pain without acute changes. Patient with neurological deficits but chronic. Not concerned with cauda equina today. Urged patient to follow up with neurosurgeon for further treatment. Information provided. Patient also with chronic headaches with pituitary tumor. Headache is unchanged from prior headaches. Patient with normal neurological exam except for those noted above. Patient to follow up with neurosurgeon for these as well.   Discussed return precautions with patient. Discussed all results and patient  verbalizes understanding and agrees with plan.  This is a shared patient. This patient was discussed with the physician who saw and evaluated the patient and agrees with the plan.        Pura Spice, PA-C 10/15/14 2000

## 2014-10-17 LAB — URINE CULTURE: Colony Count: >=100000

## 2014-10-17 NOTE — ED Provider Notes (Signed)
Medical screening examination/treatment/procedure(s) were conducted as a shared visit with non-physician practitioner(s) and myself.  I personally evaluated the patient during the encounter.   EKG Interpretation   Date/Time:  Sunday October 15 2014 06:44:56 EDT Ventricular Rate:  71 PR Interval:  156 QRS Duration: 83 QT Interval:  423 QTC Calculation: 460 R Axis:   -2 Text Interpretation:  Sinus rhythm Probable left atrial enlargement No  significant change since last tracing Confirmed by Maryan Rued  MD, Machi Whittaker  [phone]) on 10/15/2014 8:08:35 AM      Pt with atypical chest pain that is worse when pushing on his left chest and arm.  Normal VS here and pt has been evaluated multiple times in the past for vague chest pain.  Pt denies associated sx and is well appearing on exam.  Pt also c/o of leg pain and weakness which is not unchanged from baseline with a recent MRI.  Delta trop neg and pt can f/u as outpt.  Blanchie Dessert, MD 10/17/14 2014

## 2014-11-02 ENCOUNTER — Inpatient Hospital Stay (HOSPITAL_COMMUNITY)
Admission: EM | Admit: 2014-11-02 | Discharge: 2014-11-04 | DRG: 690 | Disposition: A | Discharge status: Home / Self Care | Payer: Medicaid Other | Attending: Internal Medicine | Admitting: Internal Medicine

## 2014-11-02 DIAGNOSIS — Z79899 Other long term (current) drug therapy: Secondary | ICD-10-CM

## 2014-11-02 DIAGNOSIS — F17210 Nicotine dependence, cigarettes, uncomplicated: Secondary | ICD-10-CM | POA: Diagnosis present

## 2014-11-02 DIAGNOSIS — G8929 Other chronic pain: Secondary | ICD-10-CM | POA: Diagnosis present

## 2014-11-02 DIAGNOSIS — E876 Hypokalemia: Secondary | ICD-10-CM | POA: Diagnosis present

## 2014-11-02 DIAGNOSIS — N3020 Other chronic cystitis without hematuria: Secondary | ICD-10-CM | POA: Diagnosis present

## 2014-11-02 DIAGNOSIS — E119 Type 2 diabetes mellitus without complications: Secondary | ICD-10-CM | POA: Diagnosis present

## 2014-11-02 DIAGNOSIS — I509 Heart failure, unspecified: Secondary | ICD-10-CM | POA: Diagnosis present

## 2014-11-02 DIAGNOSIS — D352 Benign neoplasm of pituitary gland: Secondary | ICD-10-CM | POA: Diagnosis present

## 2014-11-02 DIAGNOSIS — R109 Unspecified abdominal pain: Secondary | ICD-10-CM

## 2014-11-02 DIAGNOSIS — E1165 Type 2 diabetes mellitus with hyperglycemia: Secondary | ICD-10-CM | POA: Diagnosis present

## 2014-11-02 DIAGNOSIS — R51 Headache: Secondary | ICD-10-CM | POA: Diagnosis present

## 2014-11-02 DIAGNOSIS — I10 Essential (primary) hypertension: Secondary | ICD-10-CM | POA: Diagnosis present

## 2014-11-02 DIAGNOSIS — R0602 Shortness of breath: Secondary | ICD-10-CM

## 2014-11-02 DIAGNOSIS — F17200 Nicotine dependence, unspecified, uncomplicated: Secondary | ICD-10-CM | POA: Diagnosis present

## 2014-11-02 DIAGNOSIS — N390 Urinary tract infection, site not specified: Principal | ICD-10-CM | POA: Diagnosis present

## 2014-11-02 DIAGNOSIS — Z8673 Personal history of transient ischemic attack (TIA), and cerebral infarction without residual deficits: Secondary | ICD-10-CM

## 2014-11-02 DIAGNOSIS — R42 Dizziness and giddiness: Secondary | ICD-10-CM

## 2014-11-02 DIAGNOSIS — D72829 Elevated white blood cell count, unspecified: Secondary | ICD-10-CM | POA: Diagnosis present

## 2014-11-02 DIAGNOSIS — E860 Dehydration: Secondary | ICD-10-CM | POA: Diagnosis present

## 2014-11-02 DIAGNOSIS — R519 Headache, unspecified: Secondary | ICD-10-CM | POA: Diagnosis present

## 2014-11-02 LAB — BASIC METABOLIC PANEL
Anion gap: 15 (ref 5–15)
BUN: 18 mg/dL (ref 6–23)
CALCIUM: 9.3 mg/dL (ref 8.4–10.5)
CO2: 22 mEq/L (ref 19–32)
CREATININE: 1.09 mg/dL (ref 0.50–1.35)
Chloride: 99 mEq/L (ref 96–112)
GFR calc Af Amer: 82 mL/min — ABNORMAL LOW (ref >90)
GFR, EST NON AFRICAN AMERICAN: 71 mL/min — AB (ref >90)
GLUCOSE: 229 mg/dL — AB (ref 70–99)
Potassium: 3.4 mEq/L — ABNORMAL LOW (ref 3.7–5.3)
Sodium: 136 mEq/L — ABNORMAL LOW (ref 137–147)

## 2014-11-02 LAB — CBC
HCT: 38.9 % — ABNORMAL LOW (ref 39.0–52.0)
Hemoglobin: 12.8 g/dL — ABNORMAL LOW (ref 13.0–17.0)
MCH: 24.1 pg — ABNORMAL LOW (ref 26.0–34.0)
MCHC: 32.9 g/dL (ref 30.0–36.0)
MCV: 73.1 fL — ABNORMAL LOW (ref 78.0–100.0)
Platelets: 191 10*3/uL (ref 150–400)
RBC: 5.32 MIL/uL (ref 4.22–5.81)
RDW: 14.4 % (ref 11.5–15.5)
WBC: 20.5 10*3/uL — ABNORMAL HIGH (ref 4.0–10.5)

## 2014-11-02 NOTE — ED Notes (Signed)
Pt presents with dizziness that started around 12 today- pt reports that he has a brain tumor that is being watched by repeat MRI's, denies other treatment for tumor.  Pt admits to blurred vision at onset of symptoms.  Neuro exam negative, grips equal- pt alert and oriented X 4 at present.

## 2014-11-03 ENCOUNTER — Encounter (HOSPITAL_COMMUNITY): Payer: Self-pay

## 2014-11-03 ENCOUNTER — Emergency Department (HOSPITAL_COMMUNITY): Payer: Medicaid Other

## 2014-11-03 ENCOUNTER — Inpatient Hospital Stay (HOSPITAL_COMMUNITY): Payer: Medicaid Other

## 2014-11-03 DIAGNOSIS — E876 Hypokalemia: Secondary | ICD-10-CM

## 2014-11-03 DIAGNOSIS — F17210 Nicotine dependence, cigarettes, uncomplicated: Secondary | ICD-10-CM | POA: Diagnosis present

## 2014-11-03 DIAGNOSIS — I10 Essential (primary) hypertension: Secondary | ICD-10-CM | POA: Diagnosis present

## 2014-11-03 DIAGNOSIS — R42 Dizziness and giddiness: Secondary | ICD-10-CM

## 2014-11-03 DIAGNOSIS — N3020 Other chronic cystitis without hematuria: Secondary | ICD-10-CM | POA: Diagnosis present

## 2014-11-03 DIAGNOSIS — Z8673 Personal history of transient ischemic attack (TIA), and cerebral infarction without residual deficits: Secondary | ICD-10-CM | POA: Diagnosis not present

## 2014-11-03 DIAGNOSIS — I509 Heart failure, unspecified: Secondary | ICD-10-CM | POA: Diagnosis present

## 2014-11-03 DIAGNOSIS — R51 Headache: Secondary | ICD-10-CM | POA: Diagnosis present

## 2014-11-03 DIAGNOSIS — N390 Urinary tract infection, site not specified: Principal | ICD-10-CM

## 2014-11-03 DIAGNOSIS — Z79899 Other long term (current) drug therapy: Secondary | ICD-10-CM | POA: Diagnosis not present

## 2014-11-03 DIAGNOSIS — E1165 Type 2 diabetes mellitus with hyperglycemia: Secondary | ICD-10-CM | POA: Diagnosis present

## 2014-11-03 DIAGNOSIS — G8929 Other chronic pain: Secondary | ICD-10-CM | POA: Diagnosis present

## 2014-11-03 DIAGNOSIS — D352 Benign neoplasm of pituitary gland: Secondary | ICD-10-CM | POA: Diagnosis present

## 2014-11-03 DIAGNOSIS — E860 Dehydration: Secondary | ICD-10-CM | POA: Diagnosis present

## 2014-11-03 LAB — URINALYSIS, ROUTINE W REFLEX MICROSCOPIC
BILIRUBIN URINE: NEGATIVE
GLUCOSE, UA: NEGATIVE mg/dL
HGB URINE DIPSTICK: NEGATIVE
KETONES UR: 15 mg/dL — AB
Nitrite: NEGATIVE
Protein, ur: NEGATIVE mg/dL
Specific Gravity, Urine: 1.017 (ref 1.005–1.030)
Urobilinogen, UA: 1 mg/dL (ref 0.0–1.0)
pH: 8 (ref 5.0–8.0)

## 2014-11-03 LAB — RAPID URINE DRUG SCREEN, HOSP PERFORMED
Amphetamines: NOT DETECTED
BARBITURATES: NOT DETECTED
BENZODIAZEPINES: NOT DETECTED
Cocaine: NOT DETECTED
Opiates: POSITIVE — AB
TETRAHYDROCANNABINOL: NOT DETECTED

## 2014-11-03 LAB — PRO B NATRIURETIC PEPTIDE: PRO B NATRI PEPTIDE: 1363 pg/mL — AB (ref 0–125)

## 2014-11-03 LAB — GLUCOSE, CAPILLARY
GLUCOSE-CAPILLARY: 133 mg/dL — AB (ref 70–99)
Glucose-Capillary: 149 mg/dL — ABNORMAL HIGH (ref 70–99)
Glucose-Capillary: 124 mg/dL — ABNORMAL HIGH (ref 70–99)

## 2014-11-03 LAB — HEPATIC FUNCTION PANEL
ALBUMIN: 3.1 g/dL — AB (ref 3.5–5.2)
ALK PHOS: 61 U/L (ref 39–117)
ALT: 21 U/L (ref 0–53)
AST: 23 U/L (ref 0–37)
BILIRUBIN DIRECT: 0.3 mg/dL (ref 0.0–0.3)
BILIRUBIN TOTAL: 0.7 mg/dL (ref 0.3–1.2)
Indirect Bilirubin: 0.4 mg/dL (ref 0.3–0.9)
Total Protein: 7.1 g/dL (ref 6.0–8.3)

## 2014-11-03 LAB — I-STAT TROPONIN, ED: Troponin i, poc: 0 ng/mL (ref 0.00–0.08)

## 2014-11-03 LAB — URINE MICROSCOPIC-ADD ON

## 2014-11-03 MED ORDER — GLUCERNA SHAKE PO LIQD
237.0000 mL | Freq: Two times a day (BID) | ORAL | Status: DC
  Administered 2014-11-03 – 2014-11-04 (×2): 237 mL via ORAL

## 2014-11-03 MED ORDER — OXYCODONE-ACETAMINOPHEN 5-325 MG PO TABS
1.0000 | ORAL_TABLET | ORAL | Status: DC | PRN
  Administered 2014-11-03 – 2014-11-04 (×6): 1 via ORAL
  Filled 2014-11-03 (×6): qty 1

## 2014-11-03 MED ORDER — INSULIN ASPART 100 UNIT/ML ~~LOC~~ SOLN: 0.0000 [IU] | Freq: Every day | SUBCUTANEOUS | Status: DC

## 2014-11-03 MED ORDER — ISOSORBIDE DINITRATE 10 MG PO TABS
10.0000 mg | ORAL_TABLET | Freq: Three times a day (TID) | ORAL | Status: DC
  Administered 2014-11-03 – 2014-11-04 (×3): 10 mg via ORAL
  Filled 2014-11-03 (×5): qty 1

## 2014-11-03 MED ORDER — ONDANSETRON HCL 4 MG/2ML IJ SOLN: 4.0000 mg | Freq: Four times a day (QID) | INTRAMUSCULAR | Status: DC | PRN

## 2014-11-03 MED ORDER — MORPHINE SULFATE 4 MG/ML IJ SOLN
4.0000 mg | Freq: Once | INTRAMUSCULAR | Status: AC
  Administered 2014-11-03: 4 mg via INTRAVENOUS
  Filled 2014-11-03: qty 1

## 2014-11-03 MED ORDER — ASPIRIN EC 81 MG PO TBEC
81.0000 mg | DELAYED_RELEASE_TABLET | Freq: Every day | ORAL | Status: DC
  Administered 2014-11-03 – 2014-11-04 (×2): 81 mg via ORAL
  Filled 2014-11-03 (×2): qty 1

## 2014-11-03 MED ORDER — PRAVASTATIN SODIUM 20 MG PO TABS
20.0000 mg | ORAL_TABLET | Freq: Every day | ORAL | Status: DC
Start: 2014-11-03 — End: 2014-11-04
  Administered 2014-11-03: 20 mg via ORAL
  Filled 2014-11-03 (×2): qty 1

## 2014-11-03 MED ORDER — AMLODIPINE BESYLATE 10 MG PO TABS
10.0000 mg | ORAL_TABLET | Freq: Every day | ORAL | Status: DC
  Administered 2014-11-03 – 2014-11-04 (×2): 10 mg via ORAL
  Filled 2014-11-03 (×2): qty 1

## 2014-11-03 MED ORDER — ENOXAPARIN SODIUM 40 MG/0.4ML ~~LOC~~ SOLN
40.0000 mg | SUBCUTANEOUS | Status: DC
  Administered 2014-11-03: 40 mg via SUBCUTANEOUS
  Filled 2014-11-03 (×2): qty 0.4

## 2014-11-03 MED ORDER — DEXTROSE 5 % IV SOLN
1.0000 g | INTRAVENOUS | Status: DC
  Administered 2014-11-04: 1 g via INTRAVENOUS
  Filled 2014-11-03: qty 10

## 2014-11-03 MED ORDER — ACETAMINOPHEN 325 MG PO TABS: 650.0000 mg | ORAL_TABLET | Freq: Four times a day (QID) | ORAL | Status: DC | PRN

## 2014-11-03 MED ORDER — ACETAMINOPHEN 650 MG RE SUPP: 650.0000 mg | Freq: Four times a day (QID) | RECTAL | Status: DC | PRN

## 2014-11-03 MED ORDER — IOHEXOL 300 MG/ML  SOLN
100.0000 mL | Freq: Once | INTRAMUSCULAR | Status: AC | PRN
  Administered 2014-11-03: 100 mL via INTRAVENOUS

## 2014-11-03 MED ORDER — IOHEXOL 300 MG/ML  SOLN
25.0000 mL | Freq: Once | INTRAMUSCULAR | Status: AC | PRN
  Administered 2014-11-03: 25 mL via ORAL

## 2014-11-03 MED ORDER — INSULIN ASPART 100 UNIT/ML ~~LOC~~ SOLN
0.0000 [IU] | Freq: Three times a day (TID) | SUBCUTANEOUS | Status: DC
  Administered 2014-11-03 – 2014-11-04 (×4): 1 [IU] via SUBCUTANEOUS

## 2014-11-03 MED ORDER — HYDRALAZINE HCL 50 MG PO TABS
50.0000 mg | ORAL_TABLET | Freq: Three times a day (TID) | ORAL | Status: DC
Start: 2014-11-03 — End: 2014-11-04
  Administered 2014-11-03 – 2014-11-04 (×4): 50 mg via ORAL
  Filled 2014-11-03 (×6): qty 1

## 2014-11-03 MED ORDER — ONDANSETRON HCL 4 MG PO TABS: 4.0000 mg | ORAL_TABLET | Freq: Four times a day (QID) | ORAL | Status: DC | PRN

## 2014-11-03 MED ORDER — POTASSIUM CHLORIDE IN NACL 40-0.9 MEQ/L-% IV SOLN
INTRAVENOUS | Status: DC
  Administered 2014-11-03: 75 mL/h via INTRAVENOUS
  Filled 2014-11-03 (×2): qty 1000

## 2014-11-03 MED ORDER — MORPHINE SULFATE 2 MG/ML IJ SOLN
2.0000 mg | Freq: Once | INTRAMUSCULAR | Status: AC
  Administered 2014-11-03: 2 mg via INTRAVENOUS
  Filled 2014-11-03: qty 1

## 2014-11-03 MED ORDER — ALBUTEROL SULFATE (2.5 MG/3ML) 0.083% IN NEBU
2.5000 mg | INHALATION_SOLUTION | RESPIRATORY_TRACT | Status: DC | PRN
Start: 2014-11-03 — End: 2014-11-04

## 2014-11-03 MED ORDER — ONDANSETRON 4 MG PO TBDP
4.0000 mg | ORAL_TABLET | Freq: Once | ORAL | Status: AC
  Administered 2014-11-03: 4 mg via ORAL
  Filled 2014-11-03: qty 1

## 2014-11-03 MED ORDER — CEFTRIAXONE SODIUM 1 G IJ SOLR
1.0000 g | Freq: Once | INTRAMUSCULAR | Status: AC
  Administered 2014-11-03: 1 g via INTRAVENOUS
  Filled 2014-11-03: qty 10

## 2014-11-03 NOTE — Progress Notes (Signed)
INITIAL NUTRITION ASSESSMENT  DOCUMENTATION CODES Per approved criteria  -Not Applicable   INTERVENTION: Glucerna Shake po BID, each supplement provides 220 kcal and 10 grams of protein  NUTRITION DIAGNOSIS: Inadequate oral intake related to headache, nausea, and abdominal pain as evidenced by poor po and wt loss.   Goal: Pt to meet >/= 90% of their estimated nutrition needs   Monitor:  Weight trend, po intake, acceptance of supplements, labs  Reason for Assessment: MST  63 y.o. male  Admitting Dx: <principal problem not specified>  ASSESSMENT: 63 y.o. male with history of hypertension, DM 2, pituitary adenoma, tobacco abuse, polysubstance abuse, chronic headaches and UTI presented to the ED with dizziness, headache, nausea and abdominal pain.  - Pt familiar to RD from previous visits. He reports that his appetite was poor for the past month and he has lost about 5 lbs. Pt says that he ate ~50% of his lunch this afternoon. Pt received nutritional supplements during previous hospital visits and asked if they could be sent again.  - Pt with mild fat and muscle wasting of the clavicles and temple.  Height: Ht Readings from Last 1 Encounters:  11/03/14 5\' 9"  (1.753 m)    Weight: Wt Readings from Last 1 Encounters:  11/03/14 161 lb (73.029 kg)    Ideal Body Weight: 70.7 kg  % Ideal Body Weight: 103%  Wt Readings from Last 10 Encounters:  11/03/14 161 lb (73.029 kg)  10/15/14 163 lb (73.936 kg)  08/15/14 161 lb 6 oz (73.2 kg)  06/16/14 160 lb (72.576 kg)  05/15/14 160 lb (72.576 kg)  12/26/13 159 lb 13.3 oz (72.5 kg)  12/15/13 160 lb 8 oz (72.802 kg)  12/10/13 161 lb 6.4 oz (73.211 kg)  10/17/13 168 lb 6.4 oz (76.386 kg)  09/06/12 166 lb 10.7 oz (75.6 kg)    Usual Body Weight: 165-170 lbs  % Usual Body Weight: 98%  BMI:  Body mass index is 23.76 kg/(m^2).  Estimated Nutritional Needs: Kcal: 1825-2100 Protein: 95-110 g Fluid: 1.8-2.1 L/day  Skin:  Intact  Diet Order: Diet heart healthy/carb modified  EDUCATION NEEDS: -Education needs addressed   Intake/Output Summary (Last 24 hours) at 11/03/14 1546 Last data filed at 11/03/14 1104  Gross per 24 hour  Intake      0 ml  Output    525 ml  Net   -525 ml    Last BM: prior to admission   Labs:   Recent Labs Lab 11/02/14 2022  NA 136*  K 3.4*  CL 99  CO2 22  BUN 18  CREATININE 1.09  CALCIUM 9.3  GLUCOSE 229*    CBG (last 3)   Recent Labs  11/03/14 1147  GLUCAP 133*    Scheduled Meds: . amLODipine  10 mg Oral Daily  . aspirin EC  81 mg Oral Daily  . [START ON 11/04/2014] cefTRIAXone (ROCEPHIN)  IV  1 g Intravenous Q24H  . enoxaparin (LOVENOX) injection  40 mg Subcutaneous Q24H  . hydrALAZINE  50 mg Oral 3 times per day  . insulin aspart  0-5 Units Subcutaneous QHS  . insulin aspart  0-9 Units Subcutaneous TID WC  . isosorbide dinitrate  10 mg Oral TID  . pravastatin  20 mg Oral QHS    Continuous Infusions: . 0.9 % NaCl with KCl 40 mEq / L 75 mL/hr (11/03/14 1231)    Past Medical History  Diagnosis Date  . Hypertension   . Diabetes mellitus   . Brain tumor   .  CVA (cerebral vascular accident)   . Narcotic abuse   . CHF (congestive heart failure)     History reviewed. No pertinent past surgical history.  Laurette Schimke RD, LDN

## 2014-11-03 NOTE — ED Notes (Signed)
Attempted to ambulate the patient. Patient stood up fell forward, sat down, became very shakey and refused to try ambulation again after sitting.

## 2014-11-03 NOTE — Progress Notes (Deleted)
Pt with a severe headache, in bed shaking. Vicodin was given at 2308. On call provider notified, Morphine ordered. Will continue to monitor.

## 2014-11-03 NOTE — Progress Notes (Signed)
Report received from [facility] for admission to [phone]

## 2014-11-03 NOTE — ED Notes (Signed)
Patient transported to CT 

## 2014-11-03 NOTE — H&P (Addendum)
History and Physical  [name] CWC:[id number] DOB: Jan 29, 1951 DOA: 11/02/2014  Referring physician: Dr. Julianne Rice, EDP PCP: Philis Fendt, MD  Outpatient Specialists:  1. Neurosurgery: Dr. Saintclair Halsted.  Chief Complaint: dizziness, headache, nausea and abdominal pain.  HPI: [name] is a 63 y.o. male with history of hypertension, DM 2, pituitary adenoma, tobacco abuse, polysubstance abuse, chronic headaches and UTI presented to the ED with above complaints. He states that he was in his usual state of health until noontime on 11/5 when he started having subacute onset of dizziness/lightheadedness while he was ambulating and felt like he was going to pass out. He denied vertigo, tinnitus or photophobia. At about the same time he started experiencing global throbbing moderate to severe headache which was nonradiating and not associated with any aggravating or relieving factors. He also noticed diffuse abdominal pain which gradually got worse. This was not radiating and had no aggravating or relieving factors. He does volunteered to dysuria but no urinary frequency, fever or chills. He had nausea all day with associated poor oral intake but no vomiting. He had hard stools yesterday. He denies loss of consciousness. No history of chest pain, dyspnea. He has minimal dry cough. Due to persistent symptoms, he presented to the ED where potassium was 3.4, WBC 20, CT head, CT abdomen and pelvis and chest x-ray without acute findings and UA positive for features of UTI. Hospitalist admission was requested.   Review of Systems: All systems reviewed and apart from history of presenting illness, are negative.  Past Medical History  Diagnosis Date  . Hypertension   . Diabetes mellitus   . Brain tumor   . CVA (cerebral vascular accident)   . Narcotic abuse   . CHF (congestive heart failure)    History reviewed. No pertinent past surgical history. Social History:  reports that he has been  smoking.  He uses smokeless tobacco. He reports that he drinks alcohol. He reports that he uses illicit drugs. Single. Lives alone. States that he has not been smoking and has been using a nicotine patch (however not seen on his home meds). He states that he drinks a beer occasionally and denies drug abuse at this time.  No Known Allergies  Family History  Problem Relation Age of Onset  . CAD Father   . Diabetes Brother   . Diabetes Sister   . Hypertension Father   . Hypertension Mother     Prior to Admission medications   Medication Sig Start Date End Date Taking? Authorizing Provider  acetaminophen (TYLENOL) 500 MG tablet Take 500 mg by mouth every 6 (six) hours as needed for headache.   Yes Historical Provider, MD  amLODipine (NORVASC) 10 MG tablet Take 10 mg by mouth daily.   Yes Historical Provider, MD  aspirin EC 81 MG tablet Take 81 mg by mouth daily.   Yes Historical Provider, MD  b complex vitamins tablet Take 1 tablet by mouth daily.   Yes Historical Provider, MD  hydrALAZINE (APRESOLINE) 50 MG tablet Take 1 tablet (50 mg total) by mouth every 8 (eight) hours. 12/27/13  Yes Debbe Odea, MD  isosorbide dinitrate (ISORDIL) 10 MG tablet Take 1 tablet (10 mg total) by mouth 3 (three) times daily. 12/27/13  Yes Debbe Odea, MD  metFORMIN (GLUCOPHAGE) 500 MG tablet Take 500 mg by mouth 2 (two) times daily with a meal.   Yes Historical Provider, MD  oxyCODONE-acetaminophen (PERCOCET) 5-325 MG per tablet Take 1 tablet by mouth  every 4 (four) hours as needed for severe pain. 09/01/14  Yes Tatyana A Kirichenko, PA-C  pravastatin (PRAVACHOL) 20 MG tablet Take 20 mg by mouth at bedtime.   Yes Historical Provider, MD   Physical Exam: Filed Vitals:   11/03/14 0715 11/03/14 0730 11/03/14 0800 11/03/14 0815  BP: 148/61 155/59 148/59 143/65  Pulse: 80 77 77 78  Temp:      TempSrc:      Resp: 12     SpO2: 96% 96% 96% 97%  temperature 98.50F.   General exam: Moderately built and  nourished pleasant middle-aged male patient, lying comfortably supine on the gurney in no obvious distress.  Head, eyes and ENT: Nontraumatic and normocephalic. Pupils equally reacting to light and accommodation. Oral mucosa slightly dry. No nystagmus. Bilateral arcus senilis.  Neck: Supple. No JVD, carotid bruit or thyromegaly.  Lymphatics: No lymphadenopathy.  Respiratory system: Clear to auscultation. No increased work of breathing.  Cardiovascular system: S1 and S2 heard, RRR. No JVD, murmurs, gallops, clicks or pedal edema.  Gastrointestinal system: Abdomen is nondistended. Diffuse mild tenderness, especially in the suprapubic region without rigidity, guarding or rebound. Soft.. Normal bowel sounds heard. No organomegaly or masses appreciated.  Central nervous system: Alert and oriented. No focal neurological deficits.  Extremities: Symmetric 5 x 5 power. Peripheral pulses symmetrically felt.   Skin: No rashes or acute findings.  Musculoskeletal system: Negative exam.  Psychiatry: Pleasant and cooperative.   Labs on Admission:  Basic Metabolic Panel:  Recent Labs Lab 11/02/14 2022  NA 136*  K 3.4*  CL 99  CO2 22  GLUCOSE 229*  BUN 18  CREATININE 1.09  CALCIUM 9.3   Liver Function Tests:  Recent Labs Lab 11/03/14 0042  AST 23  ALT 21  ALKPHOS 61  BILITOT 0.7  PROT 7.1  ALBUMIN 3.1*   No results for input(s): LIPASE, AMYLASE in the last 168 hours. No results for input(s): AMMONIA in the last 168 hours. CBC:  Recent Labs Lab 11/02/14 2022  WBC 20.5*  HGB 12.8*  HCT 38.9*  MCV 73.1*  PLT 191   Cardiac Enzymes: No results for input(s): CKTOTAL, CKMB, CKMBINDEX, TROPONINI in the last 168 hours.  BNP (last 3 results)  Recent Labs  12/15/13 0605 10/15/14 0721 11/03/14 0042  PROBNP 72.5 73.0 1363.0*   CBG: No results for input(s): GLUCAP in the last 168 hours.  Radiological Exams on Admission: Ct Head Wo Contrast  11/03/2014   CLINICAL  DATA:  Dizziness which began approximately 12 hr ago. History of brain tumor. Headache.  EXAM: CT HEAD WITHOUT CONTRAST  TECHNIQUE: Contiguous axial images were obtained from the base of the skull through the vertex without intravenous contrast.  COMPARISON:  MRI 08/15/2014.  Head CT 08/15/2014.  FINDINGS: The brain itself has a normal appearance by CT with the exception of a small area of focal atrophy in the left posterior parietal cortical region, shown by MRI to be a site of old hemorrhage. No evidence of intra-axial mass lesion, acute hemorrhage, hydrocephalus or extra-axial collection. The pituitary adenoma being followed by MRI is not visible using CT technique. There is no fluid in the sinuses, middle ears or mastoids. No calvarial abnormality.  IMPRESSION: Normal appearance of the brain except for a small area of focal atrophy in the left posterior parietal brain, which MRI shows to be a site of old hemorrhage. No acute finding. No cause of the presenting symptoms identified.  The patient has known pituitary adenoma is not visible  using CT technique.   Electronically Signed   By: Nelson Chimes M.D.   On: 11/03/2014 01:24   Ct Abdomen Pelvis W Contrast  11/03/2014   CLINICAL DATA:  Generalized abdominal pain, worse in the epigastric region.  EXAM: CT ABDOMEN AND PELVIS WITH CONTRAST  TECHNIQUE: Multidetector CT imaging of the abdomen and pelvis was performed using the standard protocol following bolus administration of intravenous contrast.  CONTRAST:  11mL OMNIPAQUE IOHEXOL 300 MG/ML  SOLN  COMPARISON:  CT angio chest 08/15/2014. CT abdomen and pelvis 05/15/2014.  FINDINGS: Mild dependent atelectasis in the lung bases. Low-attenuation lesions in the lateral segment left lobe of the liver probably represent cysts and are unchanged since previous study. Gallbladder, pancreas, spleen, adrenal glands, kidneys, inferior vena cava, and retroperitoneal lymph nodes are unremarkable. Calcification of the  abdominal aorta without aneurysm. Stomach and small bowel are not abnormally distended. Contrast material flows through to the colon without evidence of obstruction. Stool-filled colon without abnormal distention. Scattered colonic diverticula throughout. No free air or free fluid in the abdomen.  Pelvis: Abnormal bladder with diffuse wall thickening and multiple diverticula. This appearance is unchanged since previous study. Prostate gland is not enlarged. No free or loculated pelvic fluid collections. No evidence of diverticulitis. Appendix is normal. No pelvic mass or lymphadenopathy. Mild degenerative changes in the lumbar spine. No destructive bone lesions appreciated.  IMPRESSION: Probable hepatic cysts in the liver. Chronic bladder wall thickening with multiple bladder diverticula. No acute changes since previous study.   Electronically Signed   By: Lucienne Capers M.D.   On: 11/03/2014 06:39   Dg Abd Acute W/chest  11/03/2014   CLINICAL DATA:  Abdominal pain with nausea and shortness of breath.  EXAM: ACUTE ABDOMEN SERIES (ABDOMEN 2 VIEW & CHEST 1 VIEW)  COMPARISON:  10/15/2014  FINDINGS: Bowel gas pattern does not show any evidence of ileus, obstruction or free air. Fecal material is within normal limits. No abnormal calcifications other than vascular calcification. No acute bony finding.  One-view chest shows normal heart and mediastinal shadows. There is mild chronic scarring in the lower lobes. No sign of active infiltrate, mass, effusion or collapse.  IMPRESSION: Negative abdominal radiographs. No acute cardiopulmonary disease. Some scarring at the lung bases.   Electronically Signed   By: Nelson Chimes M.D.   On: 11/03/2014 01:19    EKG: Independently reviewed. Sinus rhythm without acute findings.  Assessment/Plan Active Problems:   Diabetes mellitus type 2, uncontrolled   Pituitary adenoma   Tobacco use disorder   Leukocytosis   UTI (urinary tract infection)   Chronic pain   Headache    Dizziness   Essential hypertension   Hypokalemia   1. Dizziness and headache: Unclear etiology. Patient does have history of chronic intermittent headaches and states that he has headaches approximately 4 times per month. CT head without acute findings. Partial orthostatic blood pressure checks in ED were negative. Patient apparently unsteady on his feet in the ED. Will get MRI brain without contrast to look for any potential etiologies i.e. Stroke or complications related to his pituitary adenoma. Symptoms seem to have improved in the ED. PT & OT eval. 2. UTI/chronic cystitis: Continue IV Rocephin pending urine culture results. Chronic bladder wall thickening on CT of-may consider chronic suppressive antibiotics. 3. Mild dehydration: Secondary to poor oral intake. Brief IV fluids. 4. Hypokalemia: Replace and follow BMP 5. Uncontrolled type II DM: Hold oral hypoglycemics and place on sliding scale insulin. 6. History of pituitary adenoma:  Stable by MRI in August. Most likely not etiology of his current presentation. In any event we will follow MRI brain. Outpatient follow-up with neurosurgery. 7. Essential hypertension: Continue home antihypertensives. 8. History of polysubstance abuse-tobacco, alcohol, THC. Patient currently denies. Check CIWA scores 9. Leukocytosis: likely due stress response and UTI. Follow CBC. Single BP reading in ED 86/64 was probably an error so no SIRS criteria. 10. H/o Chronic pain: judicious use of opioids.     Code Status: Full  Family Communication: None at bedside  Disposition Plan: Home when medically stable.   Time spent: 60 minutes.  Vernell Leep, MD, FACP, FHM. [facility] Pager [phone]  If 7PM-7AM, please contact night-coverage [email or web address] Password [email or web address] 11/03/2014, 8:40 AM

## 2014-11-03 NOTE — ED Notes (Signed)
Informed pt that we need a urine sample

## 2014-11-03 NOTE — Progress Notes (Signed)
OT Cancellation Note  Patient Details Name: [name] MRN: [id number] DOB: [birth year]   Cancelled Treatment:    Reason Eval/Treat Not Completed: Pain limiting ability to participate (declined due to headache).  Will attempt again as time allows  [place], [place] 11/03/2014, 3:04 PM

## 2014-11-03 NOTE — ED Provider Notes (Signed)
CSN: [id number]     Arrival date & time 11/02/14  2001 History   First MD Initiated Contact with Patient 11/03/14 0010     Chief Complaint  Patient presents with  . Dizziness     (Consider location/radiation/quality/duration/timing/severity/associated sxs/prior Treatment) HPI Patient states that he was walking this afternoon and developed gradual onset generalized headache with mild blurred vision and lightheadedness. He states he's had mild nausea associated with this. The lightheadedness is worse when standing. He has no focal weakness or numbness. Has had some shortness of breath associated with this. No cough or chest pain. No lower extremity swelling. No vomiting or diarrhea. No recent drug use. Patient has a history of pituitary adenoma which is stable on last MRI in September. Past Medical History  Diagnosis Date  . Hypertension   . Diabetes mellitus   . Brain tumor   . CVA (cerebral vascular accident)   . Narcotic abuse   . CHF (congestive heart failure)    No past surgical history on file. Family History  Problem Relation Age of Onset  . CAD Father   . Diabetes Brother   . Diabetes Sister   . Hypertension Father   . Hypertension Mother    History  Substance Use Topics  . Smoking status: Current Every Day Smoker  . Smokeless tobacco: Current User  . Alcohol Use: Yes     Comment: 6 pack a week    Review of Systems  Constitutional: Negative for fever and chills.  Eyes: Negative for visual disturbance.  Respiratory: Positive for shortness of breath. Negative for cough.   Cardiovascular: Negative for chest pain, palpitations and leg swelling.  Gastrointestinal: Positive for nausea. Negative for vomiting and abdominal pain.  Genitourinary: Negative for dysuria, frequency and flank pain.  Musculoskeletal: Negative for myalgias, back pain, neck pain and neck stiffness.  Skin: Negative for pallor, rash and wound.  Neurological: Positive for dizziness, light-headedness  and headaches. Negative for syncope, weakness and numbness.  All other systems reviewed and are negative.     Allergies  Review of patient's allergies indicates no known allergies.  Home Medications   Prior to Admission medications   Medication Sig Start Date End Date Taking? Authorizing Provider  acetaminophen (TYLENOL) 500 MG tablet Take 500 mg by mouth every 6 (six) hours as needed for headache.   Yes Historical Provider, MD  amLODipine (NORVASC) 10 MG tablet Take 10 mg by mouth daily.   Yes Historical Provider, MD  aspirin EC 81 MG tablet Take 81 mg by mouth daily.   Yes Historical Provider, MD  b complex vitamins tablet Take 1 tablet by mouth daily.   Yes Historical Provider, MD  hydrALAZINE (APRESOLINE) 50 MG tablet Take 1 tablet (50 mg total) by mouth every 8 (eight) hours. 12/27/13  Yes Debbe Odea, MD  isosorbide dinitrate (ISORDIL) 10 MG tablet Take 1 tablet (10 mg total) by mouth 3 (three) times daily. 12/27/13  Yes Debbe Odea, MD  metFORMIN (GLUCOPHAGE) 500 MG tablet Take 500 mg by mouth 2 (two) times daily with a meal.   Yes Historical Provider, MD  oxyCODONE-acetaminophen (PERCOCET) 5-325 MG per tablet Take 1 tablet by mouth every 4 (four) hours as needed for severe pain. 09/01/14  Yes Tatyana A Kirichenko, PA-C  pravastatin (PRAVACHOL) 20 MG tablet Take 20 mg by mouth at bedtime.   Yes Historical Provider, MD  nitrofurantoin, macrocrystal-monohydrate, (MACROBID) 100 MG capsule Take 1 capsule (100 mg total) by mouth 2 (two) times daily. 10/15/14  Pura Spice, PA-C  traMADol (ULTRAM) 50 MG tablet Take 1 tablet (50 mg total) by mouth every 6 (six) hours as needed. 08/30/14   Ernestina Patches, MD   BP 121/54 mmHg  Pulse 85  Temp(Src) 98.6 F (37 C) (Oral)  Resp 25  SpO2 96% Physical Exam  Constitutional: He is oriented to person, place, and time. He appears well-developed and well-nourished. No distress.  HENT:  Head: Normocephalic and atraumatic.  Mouth/Throat:  Oropharynx is clear and moist.  Eyes: EOM are normal. Pupils are equal, round, and reactive to light.  No nystagmus  Neck: Normal range of motion. Neck supple.  Tenderness to palpation over the right trapezius. No midline cervical tenderness. No meningismus.  Cardiovascular: Normal rate and regular rhythm.  Exam reveals no gallop and no friction rub.   No murmur heard. Pulmonary/Chest: Effort normal and breath sounds normal. No respiratory distress. He has no wheezes. He has no rales. He exhibits no tenderness.  Abdominal: Soft. Bowel sounds are normal. He exhibits no distension and no mass. There is tenderness (mild diffuse abdominal tenderness. No focality. No rebound or guarding). There is no rebound and no guarding.  Musculoskeletal: Normal range of motion. He exhibits no edema or tenderness.  No thoracic or lumbar tenderness to palpation. No calf swelling or tenderness.  Neurological: He is alert and oriented to person, place, and time.  5/5 motor in the right upper and lower extremities and left upper extremity. 4/5 motor in left lower extremity patient states this is unchanged due to previous stroke. Sensation is intact. Bilateral finger to nose intact.  Skin: Skin is warm and dry. No rash noted. No erythema.  Psychiatric: He has a normal mood and affect. His behavior is normal.  Nursing note and vitals reviewed.   ED Course  Procedures (including critical care time) Labs Review Labs Reviewed  CBC - Abnormal; Notable for the following:    WBC 20.5 (*)    Hemoglobin 12.8 (*)    HCT 38.9 (*)    MCV 73.1 (*)    MCH 24.1 (*)    All other components within normal limits  BASIC METABOLIC PANEL - Abnormal; Notable for the following:    Sodium 136 (*)    Potassium 3.4 (*)    Glucose, Bld 229 (*)    GFR calc non Af Amer 71 (*)    GFR calc Af Amer 82 (*)    All other components within normal limits  URINALYSIS, ROUTINE W REFLEX MICROSCOPIC  URINE RAPID DRUG SCREEN (HOSP PERFORMED)   PRO B NATRIURETIC PEPTIDE  HEPATIC FUNCTION PANEL  I-STAT TROPOININ, ED    Imaging Review No results found.   EKG Interpretation None      MDM   Final diagnoses:  Shortness of breath   When attempting to stand patient, he became lightheaded and nauseated. He continues to have abdominal pain but his repeat exam appears to have more focal tenderness in the left lower quadrant. He does have some voluntary guarding. Elevated white blood cell count. Will get CT of the abdomen to rule out intra-abdominal process.   Patient with persistent dizziness in the emergency department. 1 g of Rocephin given. Discussed with [facility] and will admit to MedSurg bed.  Julianne Rice, MD 11/03/14 [phone]

## 2014-11-03 NOTE — ED Notes (Signed)
Ambulated pt around the room. Pt was more stable standing up, still had an unstable gait when walking. Pt stated that he wasn't as dizzy and that it didn't hurt as bad this time.

## 2014-11-03 NOTE — Progress Notes (Signed)
Utilization review completed.  

## 2014-11-04 DIAGNOSIS — N3000 Acute cystitis without hematuria: Secondary | ICD-10-CM

## 2014-11-04 DIAGNOSIS — R42 Dizziness and giddiness: Secondary | ICD-10-CM

## 2014-11-04 LAB — CBC
HEMATOCRIT: 36.2 % — AB (ref 39.0–52.0)
HEMOGLOBIN: 11.7 g/dL — AB (ref 13.0–17.0)
MCH: 23.8 pg — ABNORMAL LOW (ref 26.0–34.0)
MCHC: 32.3 g/dL (ref 30.0–36.0)
MCV: 73.6 fL — AB (ref 78.0–100.0)
Platelets: 185 10*3/uL (ref 150–400)
RBC: 4.92 MIL/uL (ref 4.22–5.81)
RDW: 14.6 % (ref 11.5–15.5)
WBC: 12.6 10*3/uL — ABNORMAL HIGH (ref 4.0–10.5)

## 2014-11-04 LAB — BASIC METABOLIC PANEL
ANION GAP: 13 (ref 5–15)
BUN: 20 mg/dL (ref 6–23)
CHLORIDE: 101 meq/L (ref 96–112)
CO2: 22 meq/L (ref 19–32)
CREATININE: 0.84 mg/dL (ref 0.50–1.35)
Calcium: 8.7 mg/dL (ref 8.4–10.5)
GFR calc Af Amer: >90 mL/min (ref >90)
GFR calc non Af Amer: >90 mL/min (ref >90)
GLUCOSE: 124 mg/dL — AB (ref 70–99)
Potassium: 3.7 mEq/L (ref 3.7–5.3)
Sodium: 136 mEq/L — ABNORMAL LOW (ref 137–147)

## 2014-11-04 LAB — GLUCOSE, CAPILLARY
GLUCOSE-CAPILLARY: 134 mg/dL — AB (ref 70–99)
GLUCOSE-CAPILLARY: 137 mg/dL — AB (ref 70–99)

## 2014-11-04 MED ORDER — CIPROFLOXACIN HCL 500 MG PO TABS: 500.0000 mg | ORAL_TABLET | Freq: Two times a day (BID) | ORAL | Status: DC

## 2014-11-04 MED ORDER — SODIUM CHLORIDE 0.9 % IV BOLUS (SEPSIS)
500.0000 mL | Freq: Once | INTRAVENOUS | Status: AC
  Administered 2014-11-04: 500 mL via INTRAVENOUS

## 2014-11-04 NOTE — Plan of Care (Signed)
Problem: Phase I Progression Outcomes Goal: Pain controlled with appropriate interventions Outcome: Completed/Met Date Met:  11/04/14 Goal: OOB as tolerated unless otherwise ordered Outcome: Progressing Goal: Voiding-avoid urinary catheter unless indicated Outcome: Completed/Met Date Met:  11/04/14

## 2014-11-04 NOTE — Progress Notes (Signed)
Patient refused to be escorted down with staff member. Education provided and pt still refused

## 2014-11-04 NOTE — Discharge Summary (Signed)
Physician Discharge Summary  [name] WJX:[id number] DOB: 1951/04/02 DOA: 11/02/2014  PCP: Philis Fendt, MD  Admit date: 11/02/2014 Discharge date: 11/04/2014  Time spent: 35 minutes  Recommendations for Outpatient Follow-up:  1. Follow-up with primary care provider in 2 days   Recommendations for primary care physician for things to follow:  Follow up on urine cultures  Discharge Diagnoses:  Active Problems:   Diabetes mellitus type 2, uncontrolled   Pituitary adenoma   Tobacco use disorder   Leukocytosis   UTI (urinary tract infection)   Chronic pain   Headache   Dizziness   Essential hypertension   Hypokalemia   Discharge Condition: stable  Diet recommendation: regular  Filed Weights   11/03/14 1039 11/04/14 0558  Weight: 73.029 kg (161 lb) 72 kg (158 lb 11.7 oz)   History of present illness:  [name] is a 63 y.o. male with history of hypertension, DM 2, pituitary adenoma, tobacco abuse, polysubstance abuse, chronic headaches and UTI presented to the ED with above complaints. He states that he was in his usual state of health until noontime on 11/5 when he started having subacute onset of dizziness/lightheadedness while he was ambulating and felt like he was going to pass out. He denied vertigo, tinnitus or photophobia. At about the same time he started experiencing global throbbing moderate to severe headache which was nonradiating and not associated with any aggravating or relieving factors. He also noticed diffuse abdominal pain which gradually got worse. This was not radiating and had no aggravating or relieving factors. He does volunteered to dysuria but no urinary frequency, fever or chills. He had nausea all day with associated poor oral intake but no vomiting. He had hard stools yesterday. He denies loss of consciousness. No history of chest pain, dyspnea. He has minimal dry cough. Due to persistent symptoms, he presented to the ED where potassium  was 3.4, WBC 20, CT head, CT abdomen and pelvis and chest x-ray without acute findings and UA positive for features of UTI. Hospitalist admission was requested.  Hospital Course:  Dizziness and headache: Unclear etiology, may be related to his urinary tract infection. CT head was without acute findings, he also underwent an MRI which was fairly unremarkable and showed that his pituitary adenoma is stable. His dizziness significantly improved this morning after hydration overnight and IV antibiotics. Patient asks and insists he goes home today. UTI/chronic cystitis: on admission patient was started on IV ceftriaxone. Patient very insistent that he be discharged home, I narrowed his antibiotics to ciprofloxacin by mouth which he is to continue for 10 additional days, he will follow-up with his primary care provider on Monday or Tuesday, and hopefully by that time we'll have the microbiology back. Clinically he improved and he was back to normal, his leukocytosis improved from 20,000 down to 12,000 overnight, he is afebrile, and patient was able to ambulate in the hallway without any lightheadedness, dizziness or any further problems  Patient's other chronic medical problems were stable and he was advised to follow-up with his PCP early next week and no medication changes were made to his home regimen.  Procedures:  none   Consultations:   none  Discharge Exam: Filed Vitals:   11/04/14 0555 11/04/14 0558 11/04/14 1025 11/04/14 1329  BP:   133/49 137/52  Pulse: 61  68 66  Temp: 98.4 F (36.9 C)   97.7 F (36.5 C)  TempSrc: Tympanic   Oral  Resp:    18  Height:  Weight:  72 kg (158 lb 11.7 oz)    SpO2: 99%   99%    General: no apparent distress Cardiovascular: regular rate and rhythm, no murmurs rubs or gallops Respiratory: clear to auscultation bilaterally  Discharge Instructions     Medication List    TAKE these medications        acetaminophen 500 MG tablet  Commonly  known as:  TYLENOL  Take 500 mg by mouth every 6 (six) hours as needed for headache.     amLODipine 10 MG tablet  Commonly known as:  NORVASC  Take 10 mg by mouth daily.     aspirin EC 81 MG tablet  Take 81 mg by mouth daily.     b complex vitamins tablet  Take 1 tablet by mouth daily.     ciprofloxacin 500 MG tablet  Commonly known as:  CIPRO  Take 1 tablet (500 mg total) by mouth 2 (two) times daily.     hydrALAZINE 50 MG tablet  Commonly known as:  APRESOLINE  Take 1 tablet (50 mg total) by mouth every 8 (eight) hours.     isosorbide dinitrate 10 MG tablet  Commonly known as:  ISORDIL  Take 1 tablet (10 mg total) by mouth 3 (three) times daily.     metFORMIN 500 MG tablet  Commonly known as:  GLUCOPHAGE  Take 500 mg by mouth 2 (two) times daily with a meal.     oxyCODONE-acetaminophen 5-325 MG per tablet  Commonly known as:  PERCOCET  Take 1 tablet by mouth every 4 (four) hours as needed for severe pain.     pravastatin 20 MG tablet  Commonly known as:  PRAVACHOL  Take 20 mg by mouth at bedtime.           Follow-up Information    Follow up with AVBUERE,EDWIN A, MD. Schedule an appointment as soon as possible for a visit in 3 days.   Specialty:  Internal Medicine   Contact information:   [address] [place] [address] [phone]       The results of significant diagnostics from this hospitalization (including imaging, microbiology, ancillary and laboratory) are listed below for reference.    Significant Diagnostic Studies: Dg Chest 2 View  10/15/2014   CLINICAL DATA:  Chest pain and shortness of breath with headache.  EXAM: CHEST  2 VIEW  COMPARISON:  08/15/2014 and 12/15/2013  FINDINGS: Lungs are adequately inflated without consolidation or effusion. Cardiomediastinal silhouette and remainder the exam is unremarkable.  IMPRESSION: No active cardiopulmonary disease.   Electronically Signed   By: Marin Olp M.D.   On: 10/15/2014 08:19   Ct  Head Wo Contrast  11/03/2014   CLINICAL DATA:  Dizziness which began approximately 12 hr ago. History of brain tumor. Headache.  EXAM: CT HEAD WITHOUT CONTRAST  TECHNIQUE: Contiguous axial images were obtained from the base of the skull through the vertex without intravenous contrast.  COMPARISON:  MRI 08/15/2014.  Head CT 08/15/2014.  FINDINGS: The brain itself has a normal appearance by CT with the exception of a small area of focal atrophy in the left posterior parietal cortical region, shown by MRI to be a site of old hemorrhage. No evidence of intra-axial mass lesion, acute hemorrhage, hydrocephalus or extra-axial collection. The pituitary adenoma being followed by MRI is not visible using CT technique. There is no fluid in the sinuses, middle ears or mastoids. No calvarial abnormality.  IMPRESSION: Normal appearance of the brain except for a  small area of focal atrophy in the left posterior parietal brain, which MRI shows to be a site of old hemorrhage. No acute finding. No cause of the presenting symptoms identified.  The patient has known pituitary adenoma is not visible using CT technique.   Electronically Signed   By: Nelson Chimes M.D.   On: 11/03/2014 01:24   Mr Brain Wo Contrast  11/04/2014   CLINICAL DATA:  Initial evaluation for dizziness, headache, nausea. History of hypertension, diabetes, pituitary adenoma.  EXAM: MRI HEAD WITHOUT CONTRAST  TECHNIQUE: Multiplanar, multiecho pulse sequences of the brain and surrounding structures were obtained without intravenous contrast.  COMPARISON:  Prior a CT from earlier the same day as well as prior MRI from 08/15/2014.  FINDINGS: The CSF containing spaces are within normal limits for patient age. Scattered subcentimeter T2/FLAIR hyperintensities within the periventricular and deep white matter both cerebral hemispheres are stable, nonspecific, but likely related minimal chronic small vessel ischemic changes. No mass lesion, midline shift, or extra-axial  fluid collection. Ventricles are normal in size without evidence of hydrocephalus.  No diffusion-weighted signal abnormality is identified to suggest acute intracranial infarct. Gray-white matter differentiation is maintained. Normal flow voids are seen within the intracranial vasculature. No acute intracranial hemorrhage identified. 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 This may be due to differences in technique (current exam on 1.5 Tesla magnet versus 3 Tesla on prior study).  The cervicomedullary junction is normal. Pituitary lesion is stable relative to prior study. Pituitary stalk is midline. The globes and optic nerves demonstrate a normal appearance with normal signal intensity. Internal auditory structures are normal.  The bone marrow signal intensity is normal. Calvarium is intact. Visualized upper cervical spine is within normal limits.  Scalp soft tissues are unremarkable.  Mild mucoperiosteal thickening present within the ethmoidal air cells bilaterally. Paranasal sinuses are otherwise clear. No mastoid effusion.  IMPRESSION: 1. No acute intracranial infarct or other abnormality identified. 2. Stable pituitary adenoma.   Electronically Signed   By: Jeannine Boga M.D.   On: 11/04/2014 00:07   Ct Abdomen Pelvis W Contrast  11/03/2014   CLINICAL DATA:  Generalized abdominal pain, worse in the epigastric region.  EXAM: CT ABDOMEN AND PELVIS WITH CONTRAST  TECHNIQUE: Multidetector CT imaging of the abdomen and pelvis was performed using the standard protocol following bolus administration of intravenous contrast.  CONTRAST:  123mL OMNIPAQUE IOHEXOL 300 MG/ML  SOLN  COMPARISON:  CT angio chest 08/15/2014. CT abdomen and pelvis 05/15/2014.  FINDINGS: Mild dependent atelectasis in the lung bases. Low-attenuation lesions in the lateral segment left lobe of the liver probably represent cysts and are unchanged since previous study.  Gallbladder, pancreas, spleen, adrenal glands, kidneys, inferior vena cava, and retroperitoneal lymph nodes are unremarkable. Calcification of the abdominal aorta without aneurysm. Stomach and small bowel are not abnormally distended. Contrast material flows through to the colon without evidence of obstruction. Stool-filled colon without abnormal distention. Scattered colonic diverticula throughout. No free air or free fluid in the abdomen.  Pelvis: Abnormal bladder with diffuse wall thickening and multiple diverticula. This appearance is unchanged since previous study. Prostate gland is not enlarged. No free or loculated pelvic fluid collections. No evidence of diverticulitis. Appendix is normal. No pelvic mass or lymphadenopathy. Mild degenerative changes in the lumbar spine. No destructive bone lesions appreciated.  IMPRESSION: Probable hepatic cysts in the liver. Chronic bladder wall thickening with multiple bladder diverticula. No acute changes  since previous study.   Electronically Signed   By: Lucienne Capers M.D.   On: 11/03/2014 06:39   Dg Abd Acute W/chest  11/03/2014   CLINICAL DATA:  Abdominal pain with nausea and shortness of breath.  EXAM: ACUTE ABDOMEN SERIES (ABDOMEN 2 VIEW & CHEST 1 VIEW)  COMPARISON:  10/15/2014  FINDINGS: Bowel gas pattern does not show any evidence of ileus, obstruction or free air. Fecal material is within normal limits. No abnormal calcifications other than vascular calcification. No acute bony finding.  One-view chest shows normal heart and mediastinal shadows. There is mild chronic scarring in the lower lobes. No sign of active infiltrate, mass, effusion or collapse.  IMPRESSION: Negative abdominal radiographs. No acute cardiopulmonary disease. Some scarring at the lung bases.   Electronically Signed   By: Nelson Chimes M.D.   On: 11/03/2014 01:19    Microbiology: No results found for this or any previous visit (from the past 240 hour(s)).   Labs: Basic Metabolic  Panel:  Recent Labs Lab 11/02/14 2022 11/04/14 0310  NA 136* 136*  K 3.4* 3.7  CL 99 101  CO2 22 22  GLUCOSE 229* 124*  BUN 18 20  CREATININE 1.09 0.84  CALCIUM 9.3 8.7   Liver Function Tests:  Recent Labs Lab 11/03/14 0042  AST 23  ALT 21  ALKPHOS 61  BILITOT 0.7  PROT 7.1  ALBUMIN 3.1*   No results for input(s): LIPASE, AMYLASE in the last 168 hours. No results for input(s): AMMONIA in the last 168 hours. CBC:  Recent Labs Lab 11/02/14 2022 11/04/14 0310  WBC 20.5* 12.6*  HGB 12.8* 11.7*  HCT 38.9* 36.2*  MCV 73.1* 73.6*  PLT 191 185   BNP: BNP (last 3 results)  Recent Labs  12/15/13 0605 10/15/14 0721 11/03/14 0042  PROBNP 72.5 73.0 1363.0*   CBG:  Recent Labs Lab 11/03/14 1147 11/03/14 1702 11/03/14 2157 11/04/14 0758 11/04/14 1201  GLUCAP 133* 149* 124* 134* 137*    Signed:  Brenten Janney  [facility] 11/04/2014, 3:32 PM

## 2014-11-04 NOTE — Discharge Instructions (Signed)

## 2014-11-04 NOTE — Progress Notes (Signed)
Pt with a severe headache, in bed shaking. Percocet was given at 2308. On call provider notified, Morphine ordered. Will continue to monitor.

## 2014-11-04 NOTE — Progress Notes (Signed)
NURSING PROGRESS NOTE  [name] [id number] Discharge Data: 11/04/2014 2:18 PM Attending Provider: Caren Griffins, MD PFY:[id number],MNOTR A, MD     [name] to be D/C'd Home per MD order.  Discussed with the patient the After Visit Summary and all questions fully answered. All IV's discontinued with no bleeding noted. All belongings returned to patient for patient to take home.   Last Vital Signs:  Blood pressure 137/52, pulse 66, temperature 97.7 F (36.5 C), temperature source Oral, resp. rate 18, height 5\' 9"  (1.753 m), weight 72 kg (158 lb 11.7 oz), SpO2 99 %.  Discharge Medication List   Medication List    TAKE these medications        acetaminophen 500 MG tablet  Commonly known as:  TYLENOL  Take 500 mg by mouth every 6 (six) hours as needed for headache.     amLODipine 10 MG tablet  Commonly known as:  NORVASC  Take 10 mg by mouth daily.     aspirin EC 81 MG tablet  Take 81 mg by mouth daily.     b complex vitamins tablet  Take 1 tablet by mouth daily.     ciprofloxacin 500 MG tablet  Commonly known as:  CIPRO  Take 1 tablet (500 mg total) by mouth 2 (two) times daily.     hydrALAZINE 50 MG tablet  Commonly known as:  APRESOLINE  Take 1 tablet (50 mg total) by mouth every 8 (eight) hours.     isosorbide dinitrate 10 MG tablet  Commonly known as:  ISORDIL  Take 1 tablet (10 mg total) by mouth 3 (three) times daily.     metFORMIN 500 MG tablet  Commonly known as:  GLUCOPHAGE  Take 500 mg by mouth 2 (two) times daily with a meal.     oxyCODONE-acetaminophen 5-325 MG per tablet  Commonly known as:  PERCOCET  Take 1 tablet by mouth every 4 (four) hours as needed for severe pain.     pravastatin 20 MG tablet  Commonly known as:  PRAVACHOL  Take 20 mg by mouth at bedtime.         Wallie Renshaw, RN

## 2014-11-04 NOTE — Progress Notes (Deleted)
PROGRESS NOTE  [name] PYK:[id number] DOB: 06-19-1951 DOA: 11/02/2014 PCP: Philis Fendt, MD  HPI: 63 y.o. male with history of hypertension, DM 2, pituitary adenoma, tobacco abuse, polysubstance abuse, chronic headaches and UTI presented to the ED with dizziness, headache, nausea and abdominal pain, found to have a UTI  Subjective/ 24 H Interval events - patient feeling better this morning, his lightheadedness on standing, while still present,  has improved.  Assessment/Plan: Active Problems:   Diabetes mellitus type 2, uncontrolled   Pituitary adenoma   Tobacco use disorder   Leukocytosis   UTI (urinary tract infection)   Chronic pain   Headache   Dizziness   Essential hypertension   Hypokalemia    Dizziness and headache: Unclear etiology, may be related to his urinary tract infection. CT head was without acute findings, he also underwent an MRI which was fairly unremarkable and showed that his pituitary adenoma is stable. - PT & OT eval.  UTI/chronic cystitis: on admission patient was started on IV ceftriaxone with subsequent improvement in his significant leukocytosis and improvement in his clinical picture. We'll continue IV antibiotics today, narrow based on the urine culture.  Mild dehydration: Secondary to poor oral intake. Brief IV fluids.  Hypokalemia: Replace and follow BMP  Uncontrolled type II DM: Hold oral hypoglycemics and place on sliding scale insulin.  History of pituitary adenoma: Stable by MRI. Outpatient follow-up with neurosurgery.  Essential hypertension: Continue home antihypertensives.  History of polysubstance abuse-tobacco, alcohol, THC. Patient currently denies. Check CIWA scores  Leukocytosis: likely due stress response and UTI, improved to 12 K this morning  H/o Chronic pain: judicious use of opioids.  Diet: heart Fluids: NS DVT Prophylaxis: Lovenox  Code Status: Full Family Communication: d/w patient  Disposition Plan:  inpatient  Consultants:  None   Procedures:  None    Antibiotics Ceftriaxone    Studies  Ct Head Wo Contrast 11/03/2014 Normal appearance of the brain except for a small area of focal atrophy in the left posterior parietal brain, which MRI shows to be a site of old hemorrhage. No acute finding. No cause of the presenting symptoms identified.  The patient has known pituitary adenoma is not visible using CT technique.   Mr Brain Wo Contrast 11/04/2014 . No acute intracranial infarct or other abnormality identified. 2. Stable pituitary adenoma.    Ct Abdomen Pelvis W Contrast 11/03/2014 Probable hepatic cysts in the liver. Chronic bladder wall thickening with multiple bladder diverticula. No acute changes since previous study.  Dg Abd Acute W/chest 11/03/2014 Negative abdominal radiographs. No acute cardiopulmonary disease. Some scarring at the lung bases.     Objective  Filed Vitals:   11/03/14 1508 11/03/14 2201 11/04/14 0555 11/04/14 0558  BP: 112/35 122/51    Pulse: 78 65 61   Temp:  98.6 F (37 C) 98.4 F (36.9 C)   TempSrc:  Oral Tympanic   Resp:  16    Height:      Weight:    72 kg (158 lb 11.7 oz)  SpO2:  99% 99%     Intake/Output Summary (Last 24 hours) at 11/04/14 0704 Last data filed at 11/04/14 0434  Gross per 24 hour  Intake    980 ml  Output    850 ml  Net    130 ml   Filed Weights   11/03/14 1039 11/04/14 0558  Weight: 73.029 kg (161 lb) 72 kg (158 lb 11.7 oz)    Exam:  General:  He is  in no acute distress,  Cardiovascular: regular rate and rhythm, without murmurs rubs or gallops  Respiratory: clear to auscultation bilaterally, no wheezing  Abdomen: soft, nontender to palpation  MSK: no peripheral edema  Neuro: nonfocal  Data Reviewed: Basic Metabolic Panel:  Recent Labs Lab 11/02/14 2022 11/04/14 0310  NA 136* 136*  K 3.4* 3.7  CL 99 101  CO2 22 22  GLUCOSE 229* 124*  BUN 18 20  CREATININE 1.09 0.84  CALCIUM 9.3 8.7   Liver  Function Tests:  Recent Labs Lab 11/03/14 0042  AST 23  ALT 21  ALKPHOS 61  BILITOT 0.7  PROT 7.1  ALBUMIN 3.1*   CBC:  Recent Labs Lab 11/02/14 2022 11/04/14 0310  WBC 20.5* 12.6*  HGB 12.8* 11.7*  HCT 38.9* 36.2*  MCV 73.1* 73.6*  PLT 191 185   BNP (last 3 results)  Recent Labs  12/15/13 0605 10/15/14 0721 11/03/14 0042  PROBNP 72.5 73.0 1363.0*   CBG:  Recent Labs Lab 11/03/14 1147 11/03/14 1702 11/03/14 2157  GLUCAP 133* 149* 124*   Scheduled Meds: . amLODipine  10 mg Oral Daily  . aspirin EC  81 mg Oral Daily  . cefTRIAXone (ROCEPHIN)  IV  1 g Intravenous Q24H  . enoxaparin (LOVENOX) injection  40 mg Subcutaneous Q24H  . feeding supplement (GLUCERNA SHAKE)  237 mL Oral BID BM  . hydrALAZINE  50 mg Oral 3 times per day  . insulin aspart  0-5 Units Subcutaneous QHS  . insulin aspart  0-9 Units Subcutaneous TID WC  . isosorbide dinitrate  10 mg Oral TID  . pravastatin  20 mg Oral QHS  . sodium chloride  500 mL Intravenous Once   Continuous Infusions: . 0.9 % NaCl with KCl 40 mEq / L 75 mL/hr (11/03/14 1231)    Time spent: 25 minutes   Marzetta Board, MD [facility] Pager [phone]. If 7 PM - 7 AM, please contact night-coverage at [email or web address], password [facility] 11/04/2014, 7:04 AM  LOS: 2 days

## 2014-11-05 NOTE — Progress Notes (Signed)
No NCM needs identified. Pt has Medicaid to purchase meds.  Jonnie Finner RN CCM Case Mgmt phone [phone]

## 2014-11-06 LAB — URINE CULTURE: Colony Count: >=100000

## 2015-01-12 ENCOUNTER — Encounter (HOSPITAL_COMMUNITY): Payer: Self-pay | Admitting: Emergency Medicine

## 2015-01-12 ENCOUNTER — Inpatient Hospital Stay (HOSPITAL_COMMUNITY)
Admission: EM | Admit: 2015-01-12 | Discharge: 2015-01-15 | DRG: 690 | Disposition: A | Discharge status: Home / Self Care | Payer: Medicaid Other | Attending: Internal Medicine | Admitting: Internal Medicine

## 2015-01-12 ENCOUNTER — Emergency Department (HOSPITAL_COMMUNITY): Payer: Medicaid Other

## 2015-01-12 DIAGNOSIS — I509 Heart failure, unspecified: Secondary | ICD-10-CM | POA: Diagnosis present

## 2015-01-12 DIAGNOSIS — Z8673 Personal history of transient ischemic attack (TIA), and cerebral infarction without residual deficits: Secondary | ICD-10-CM

## 2015-01-12 DIAGNOSIS — N12 Tubulo-interstitial nephritis, not specified as acute or chronic: Principal | ICD-10-CM | POA: Diagnosis present

## 2015-01-12 DIAGNOSIS — R1011 Right upper quadrant pain: Secondary | ICD-10-CM | POA: Diagnosis present

## 2015-01-12 DIAGNOSIS — D352 Benign neoplasm of pituitary gland: Secondary | ICD-10-CM | POA: Diagnosis present

## 2015-01-12 DIAGNOSIS — Z79891 Long term (current) use of opiate analgesic: Secondary | ICD-10-CM | POA: Diagnosis not present

## 2015-01-12 DIAGNOSIS — N390 Urinary tract infection, site not specified: Secondary | ICD-10-CM | POA: Diagnosis present

## 2015-01-12 DIAGNOSIS — F17210 Nicotine dependence, cigarettes, uncomplicated: Secondary | ICD-10-CM | POA: Diagnosis present

## 2015-01-12 DIAGNOSIS — IMO0002 Reserved for concepts with insufficient information to code with codable children: Secondary | ICD-10-CM

## 2015-01-12 DIAGNOSIS — G8929 Other chronic pain: Secondary | ICD-10-CM | POA: Diagnosis present

## 2015-01-12 DIAGNOSIS — Z8249 Family history of ischemic heart disease and other diseases of the circulatory system: Secondary | ICD-10-CM | POA: Diagnosis not present

## 2015-01-12 DIAGNOSIS — Z7982 Long term (current) use of aspirin: Secondary | ICD-10-CM

## 2015-01-12 DIAGNOSIS — Z79899 Other long term (current) drug therapy: Secondary | ICD-10-CM

## 2015-01-12 DIAGNOSIS — N419 Inflammatory disease of prostate, unspecified: Secondary | ICD-10-CM | POA: Diagnosis present

## 2015-01-12 DIAGNOSIS — R109 Unspecified abdominal pain: Secondary | ICD-10-CM | POA: Diagnosis present

## 2015-01-12 DIAGNOSIS — E119 Type 2 diabetes mellitus without complications: Secondary | ICD-10-CM | POA: Diagnosis present

## 2015-01-12 DIAGNOSIS — N400 Enlarged prostate without lower urinary tract symptoms: Secondary | ICD-10-CM | POA: Diagnosis present

## 2015-01-12 DIAGNOSIS — Z792 Long term (current) use of antibiotics: Secondary | ICD-10-CM

## 2015-01-12 DIAGNOSIS — D496 Neoplasm of unspecified behavior of brain: Secondary | ICD-10-CM | POA: Diagnosis present

## 2015-01-12 DIAGNOSIS — I10 Essential (primary) hypertension: Secondary | ICD-10-CM | POA: Diagnosis present

## 2015-01-12 DIAGNOSIS — E1165 Type 2 diabetes mellitus with hyperglycemia: Secondary | ICD-10-CM | POA: Diagnosis present

## 2015-01-12 DIAGNOSIS — E876 Hypokalemia: Secondary | ICD-10-CM | POA: Diagnosis present

## 2015-01-12 DIAGNOSIS — D72829 Elevated white blood cell count, unspecified: Secondary | ICD-10-CM | POA: Diagnosis present

## 2015-01-12 DIAGNOSIS — Z833 Family history of diabetes mellitus: Secondary | ICD-10-CM

## 2015-01-12 DIAGNOSIS — N410 Acute prostatitis: Secondary | ICD-10-CM

## 2015-01-12 HISTORY — DX: Dizziness and giddiness: R42

## 2015-01-12 HISTORY — DX: Transient cerebral ischemic attack, unspecified: G45.9

## 2015-01-12 HISTORY — DX: Nicotine dependence, unspecified, uncomplicated: F17.200

## 2015-01-12 HISTORY — DX: Angioneurotic edema, initial encounter: T78.3XXA

## 2015-01-12 HISTORY — DX: Alcohol dependence, uncomplicated: F10.20

## 2015-01-12 HISTORY — DX: Essential (primary) hypertension: I10

## 2015-01-12 LAB — CBC WITH DIFFERENTIAL/PLATELET
BASOS PCT: 0 % (ref 0–1)
Basophils Absolute: 0 10*3/uL (ref 0.0–0.1)
EOS ABS: 0.2 10*3/uL (ref 0.0–0.7)
Eosinophils Relative: 1 % (ref 0–5)
HCT: 43.6 % (ref 39.0–52.0)
Hemoglobin: 14.5 g/dL (ref 13.0–17.0)
Lymphocytes Relative: 24 % (ref 12–46)
Lymphs Abs: 4.3 10*3/uL — ABNORMAL HIGH (ref 0.7–4.0)
MCH: 24.2 pg — ABNORMAL LOW (ref 26.0–34.0)
MCHC: 33.3 g/dL (ref 30.0–36.0)
MCV: 72.7 fL — AB (ref 78.0–100.0)
MONO ABS: 1.1 10*3/uL — AB (ref 0.1–1.0)
MONOS PCT: 6 % (ref 3–12)
NEUTROS ABS: 12.4 10*3/uL — AB (ref 1.7–7.7)
Neutrophils Relative %: 69 % (ref 43–77)
Platelets: 313 10*3/uL (ref 150–400)
RBC: 6 MIL/uL — AB (ref 4.22–5.81)
RDW: 14.2 % (ref 11.5–15.5)
WBC: 18 10*3/uL — AB (ref 4.0–10.5)

## 2015-01-12 LAB — GLUCOSE, CAPILLARY
GLUCOSE-CAPILLARY: 129 mg/dL — AB (ref 70–99)
Glucose-Capillary: 129 mg/dL — ABNORMAL HIGH (ref 70–99)

## 2015-01-12 LAB — URINALYSIS, ROUTINE W REFLEX MICROSCOPIC
Bilirubin Urine: NEGATIVE
Glucose, UA: NEGATIVE mg/dL
Hgb urine dipstick: NEGATIVE
Ketones, ur: NEGATIVE mg/dL
NITRITE: POSITIVE — AB
PROTEIN: NEGATIVE mg/dL
Specific Gravity, Urine: 1.015 (ref 1.005–1.030)
Urobilinogen, UA: 0.2 mg/dL (ref 0.0–1.0)
pH: 7.5 (ref 5.0–8.0)

## 2015-01-12 LAB — COMPREHENSIVE METABOLIC PANEL
ALBUMIN: 4.3 g/dL (ref 3.5–5.2)
ALT: 45 U/L (ref 0–53)
AST: 49 U/L — AB (ref 0–37)
Alkaline Phosphatase: 68 U/L (ref 39–117)
Anion gap: 12 (ref 5–15)
BUN: 24 mg/dL — AB (ref 6–23)
CALCIUM: 9.5 mg/dL (ref 8.4–10.5)
CHLORIDE: 104 meq/L (ref 96–112)
CO2: 22 mmol/L (ref 19–32)
Creatinine, Ser: 0.88 mg/dL (ref 0.50–1.35)
GFR calc Af Amer: >90 mL/min (ref >90)
GFR calc non Af Amer: >90 mL/min (ref >90)
Glucose, Bld: 149 mg/dL — ABNORMAL HIGH (ref 70–99)
POTASSIUM: 3.3 mmol/L — AB (ref 3.5–5.1)
SODIUM: 138 mmol/L (ref 135–145)
TOTAL PROTEIN: 8.4 g/dL — AB (ref 6.0–8.3)
Total Bilirubin: 1.4 mg/dL — ABNORMAL HIGH (ref 0.3–1.2)

## 2015-01-12 LAB — CBG MONITORING, ED
GLUCOSE-CAPILLARY: 138 mg/dL — AB (ref 70–99)
Glucose-Capillary: 161 mg/dL — ABNORMAL HIGH (ref 70–99)
Glucose-Capillary: 136 mg/dL — ABNORMAL HIGH (ref 70–99)
Glucose-Capillary: 131 mg/dL — ABNORMAL HIGH (ref 70–99)

## 2015-01-12 LAB — POC OCCULT BLOOD, ED: FECAL OCCULT BLD: NEGATIVE

## 2015-01-12 LAB — LIPASE, BLOOD: LIPASE: 45 U/L (ref 11–59)

## 2015-01-12 LAB — URINE MICROSCOPIC-ADD ON

## 2015-01-12 MED ORDER — POTASSIUM CHLORIDE IN NACL 40-0.9 MEQ/L-% IV SOLN
INTRAVENOUS | Status: DC
  Administered 2015-01-12 – 2015-01-14 (×3): 50 mL/h via INTRAVENOUS
  Filled 2015-01-12 (×3): qty 1000

## 2015-01-12 MED ORDER — HYDRALAZINE HCL 50 MG PO TABS
50.0000 mg | ORAL_TABLET | Freq: Three times a day (TID) | ORAL | Status: DC
  Administered 2015-01-12 – 2015-01-15 (×9): 50 mg via ORAL
  Filled 2015-01-12 (×12): qty 1

## 2015-01-12 MED ORDER — KETOROLAC TROMETHAMINE 30 MG/ML IJ SOLN
30.0000 mg | Freq: Four times a day (QID) | INTRAMUSCULAR | Status: DC | PRN
  Administered 2015-01-12 – 2015-01-13 (×3): 30 mg via INTRAVENOUS
  Filled 2015-01-12 (×3): qty 1

## 2015-01-12 MED ORDER — METOCLOPRAMIDE HCL 5 MG/ML IJ SOLN
10.0000 mg | Freq: Once | INTRAMUSCULAR | Status: AC
  Administered 2015-01-12: 10 mg via INTRAVENOUS
  Filled 2015-01-12: qty 2

## 2015-01-12 MED ORDER — HYDROMORPHONE HCL 1 MG/ML IJ SOLN
1.0000 mg | Freq: Once | INTRAMUSCULAR | Status: AC
  Administered 2015-01-12: 1 mg via INTRAVENOUS
  Filled 2015-01-12: qty 1

## 2015-01-12 MED ORDER — ENOXAPARIN SODIUM 40 MG/0.4ML ~~LOC~~ SOLN
40.0000 mg | SUBCUTANEOUS | Status: DC
  Administered 2015-01-12 – 2015-01-14 (×3): 40 mg via SUBCUTANEOUS
  Filled 2015-01-12 (×4): qty 0.4

## 2015-01-12 MED ORDER — ACETAMINOPHEN 650 MG RE SUPP: 650.0000 mg | Freq: Four times a day (QID) | RECTAL | Status: DC | PRN

## 2015-01-12 MED ORDER — DIPHENHYDRAMINE HCL 50 MG/ML IJ SOLN
25.0000 mg | Freq: Once | INTRAMUSCULAR | Status: AC
  Administered 2015-01-12: 25 mg via INTRAVENOUS
  Filled 2015-01-12: qty 1

## 2015-01-12 MED ORDER — DEXTROSE 5 % IV SOLN
1.0000 g | INTRAVENOUS | Status: DC
  Administered 2015-01-13 – 2015-01-14 (×2): 1 g via INTRAVENOUS
  Filled 2015-01-12 (×2): qty 10

## 2015-01-12 MED ORDER — ISOSORBIDE DINITRATE 10 MG PO TABS
10.0000 mg | ORAL_TABLET | Freq: Three times a day (TID) | ORAL | Status: DC
  Administered 2015-01-12 – 2015-01-14 (×9): 10 mg via ORAL
  Filled 2015-01-12 (×12): qty 1

## 2015-01-12 MED ORDER — ONDANSETRON HCL 4 MG/2ML IJ SOLN
4.0000 mg | Freq: Once | INTRAMUSCULAR | Status: AC
  Administered 2015-01-12: 4 mg via INTRAVENOUS
  Filled 2015-01-12: qty 2

## 2015-01-12 MED ORDER — INSULIN ASPART 100 UNIT/ML ~~LOC~~ SOLN
0.0000 [IU] | Freq: Three times a day (TID) | SUBCUTANEOUS | Status: DC
  Administered 2015-01-12 – 2015-01-13 (×3): 1 [IU] via SUBCUTANEOUS
  Administered 2015-01-13: 2 [IU] via SUBCUTANEOUS
  Administered 2015-01-14 (×2): 1 [IU] via SUBCUTANEOUS
  Filled 2015-01-12: qty 1

## 2015-01-12 MED ORDER — DEXTROSE 5 % IV SOLN
1.0000 g | Freq: Once | INTRAVENOUS | Status: AC
  Administered 2015-01-12: 1 g via INTRAVENOUS
  Filled 2015-01-12: qty 10

## 2015-01-12 MED ORDER — SODIUM CHLORIDE 0.9 % IV BOLUS (SEPSIS)
1000.0000 mL | Freq: Once | INTRAVENOUS | Status: AC
  Administered 2015-01-12: 1000 mL via INTRAVENOUS

## 2015-01-12 MED ORDER — OXYCODONE-ACETAMINOPHEN 5-325 MG PO TABS
1.0000 | ORAL_TABLET | ORAL | Status: DC | PRN
  Administered 2015-01-12 – 2015-01-13 (×4): 1 via ORAL
  Filled 2015-01-12 (×4): qty 1

## 2015-01-12 MED ORDER — AMLODIPINE BESYLATE 10 MG PO TABS
10.0000 mg | ORAL_TABLET | Freq: Every day | ORAL | Status: DC
  Administered 2015-01-12 – 2015-01-14 (×3): 10 mg via ORAL
  Filled 2015-01-12 (×4): qty 1

## 2015-01-12 MED ORDER — ACETAMINOPHEN 325 MG PO TABS
650.0000 mg | ORAL_TABLET | Freq: Four times a day (QID) | ORAL | Status: DC | PRN
  Administered 2015-01-13: 650 mg via ORAL
  Filled 2015-01-12: qty 2

## 2015-01-12 MED ORDER — ASPIRIN EC 81 MG PO TBEC
81.0000 mg | DELAYED_RELEASE_TABLET | Freq: Every day | ORAL | Status: DC
  Administered 2015-01-12 – 2015-01-14 (×3): 81 mg via ORAL
  Filled 2015-01-12 (×4): qty 1

## 2015-01-12 MED ORDER — INSULIN ASPART 100 UNIT/ML ~~LOC~~ SOLN: 0.0000 [IU] | Freq: Every day | SUBCUTANEOUS | Status: DC

## 2015-01-12 MED ORDER — ALUM & MAG HYDROXIDE-SIMETH 200-200-20 MG/5ML PO SUSP: 30.0000 mL | Freq: Four times a day (QID) | ORAL | Status: DC | PRN

## 2015-01-12 MED ORDER — MORPHINE SULFATE 4 MG/ML IJ SOLN
4.0000 mg | Freq: Once | INTRAMUSCULAR | Status: AC
  Administered 2015-01-12: 4 mg via INTRAVENOUS
  Filled 2015-01-12: qty 1

## 2015-01-12 MED ORDER — ONDANSETRON HCL 4 MG/2ML IJ SOLN
4.0000 mg | Freq: Four times a day (QID) | INTRAMUSCULAR | Status: DC | PRN
  Administered 2015-01-12 – 2015-01-14 (×4): 4 mg via INTRAVENOUS
  Filled 2015-01-12 (×4): qty 2

## 2015-01-12 MED ORDER — PRAVASTATIN SODIUM 20 MG PO TABS
20.0000 mg | ORAL_TABLET | Freq: Every day | ORAL | Status: DC
Start: 2015-01-12 — End: 2015-01-15
  Administered 2015-01-12 – 2015-01-14 (×3): 20 mg via ORAL
  Filled 2015-01-12 (×4): qty 1

## 2015-01-12 MED ORDER — ONDANSETRON HCL 4 MG PO TABS
4.0000 mg | ORAL_TABLET | Freq: Four times a day (QID) | ORAL | Status: DC | PRN
  Administered 2015-01-13 – 2015-01-14 (×3): 4 mg via ORAL
  Filled 2015-01-12 (×3): qty 1

## 2015-01-12 NOTE — ED Provider Notes (Addendum)
CSN: [id number]     Arrival date & time 01/12/15  3903 History   First MD Initiated Contact with Patient 01/12/15 0321     Chief Complaint  Patient presents with  . Abdominal Pain     (Consider location/radiation/quality/duration/timing/severity/associated sxs/prior Treatment) HPI 64 year old male presents to emergency room with complaint of headache and abdominal pain.  He reports symptoms started 2 days ago.  He has has nausea, vomiting and diarrhea.  He reports his stools have been black.  Pain in the abdomen is diffuse.  He reports pain across his forehead.  No fevers or chills.  He denies previous history of gallbladder problems, pancreatitis.  He denies drinking alcohol.  Patient reports he has a history of hypertension, stroke and brain tumor.  He reports he has a remote history of heroin use, but has not used in several months.  No sick contacts, no unusual foods. Past Medical History  Diagnosis Date  . Hypertension   . Diabetes mellitus   . Brain tumor   . CVA (cerebral vascular accident)   . Narcotic abuse   . CHF (congestive heart failure)    History reviewed. No pertinent past surgical history. Family History  Problem Relation Age of Onset  . CAD Father   . Diabetes Brother   . Diabetes Sister   . Hypertension Father   . Hypertension Mother    History  Substance Use Topics  . Smoking status: Current Every Day Smoker  . Smokeless tobacco: Current User  . Alcohol Use: Yes     Comment: 6 pack a week    Review of Systems  See History of Present Illness; otherwise all other systems are reviewed and negative   Allergies  Review of patient's allergies indicates no known allergies.  Home Medications   Prior to Admission medications   Medication Sig Start Date End Date Taking? Authorizing Provider  acetaminophen (TYLENOL) 500 MG tablet Take 500 mg by mouth every 6 (six) hours as needed for headache.   Yes Historical Provider, MD  amLODipine (NORVASC) 10 MG tablet  Take 10 mg by mouth daily.   Yes Historical Provider, MD  aspirin EC 81 MG tablet Take 81 mg by mouth daily.   Yes Historical Provider, MD  b complex vitamins tablet Take 1 tablet by mouth daily.   Yes Historical Provider, MD  hydrALAZINE (APRESOLINE) 50 MG tablet Take 1 tablet (50 mg total) by mouth every 8 (eight) hours. 12/27/13  Yes Debbe Odea, MD  isosorbide dinitrate (ISORDIL) 10 MG tablet Take 1 tablet (10 mg total) by mouth 3 (three) times daily. 12/27/13  Yes Debbe Odea, MD  metFORMIN (GLUCOPHAGE) 500 MG tablet Take 500 mg by mouth 2 (two) times daily with a meal.   Yes Historical Provider, MD  oxyCODONE-acetaminophen (PERCOCET) 5-325 MG per tablet Take 1 tablet by mouth every 4 (four) hours as needed for severe pain. 09/01/14  Yes Tatyana A Kirichenko, PA-C  pravastatin (PRAVACHOL) 20 MG tablet Take 20 mg by mouth at bedtime.   Yes Historical Provider, MD  ciprofloxacin (CIPRO) 500 MG tablet Take 1 tablet (500 mg total) by mouth 2 (two) times daily. Patient not taking: Reported on 01/12/2015 11/04/14   Caren Griffins, MD   BP 168/68 mmHg  Pulse 61  Temp(Src) 98.7 F (37.1 C) (Oral)  Resp 18  SpO2 100% Physical Exam  Constitutional: He is oriented to person, place, and time. He appears well-developed and well-nourished. He appears distressed (uncomfortable appearing).  HENT:  Head:  Normocephalic and atraumatic.  Nose: Nose normal.  Mouth/Throat: Oropharynx is clear and moist.  Eyes: Conjunctivae and EOM are normal. Pupils are equal, round, and reactive to light.  Neck: Normal range of motion. Neck supple. No JVD present. No tracheal deviation present. No thyromegaly present.  Cardiovascular: Normal rate, regular rhythm, normal heart sounds and intact distal pulses.  Exam reveals no gallop and no friction rub.   No murmur heard. Pulmonary/Chest: Effort normal and breath sounds normal. No stridor. No respiratory distress. He has no wheezes. He has no rales. He exhibits no  tenderness.  Abdominal: Soft. Bowel sounds are normal. He exhibits no distension and no mass. There is tenderness (patient has diffuse tenderness to palpation throughout the abdomen). There is no rebound and no guarding.  Genitourinary: Guaiac negative stool.  Prostate not boggy but extremely tender  Musculoskeletal: Normal range of motion. He exhibits no edema or tenderness.  Lymphadenopathy:    He has no cervical adenopathy.  Neurological: He is alert and oriented to person, place, and time. He displays normal reflexes. No cranial nerve deficit. He exhibits normal muscle tone. Coordination normal.  Skin: Skin is warm and dry. No rash noted. No erythema. No pallor.  Psychiatric: He has a normal mood and affect. His behavior is normal. Judgment and thought content normal.  Nursing note and vitals reviewed.   ED Course  Procedures (including critical care time) Labs Review Labs Reviewed  CBC WITH DIFFERENTIAL - Abnormal; Notable for the following:    WBC 18.0 (*)    RBC 6.00 (*)    MCV 72.7 (*)    MCH 24.2 (*)    Neutro Abs 12.4 (*)    Lymphs Abs 4.3 (*)    Monocytes Absolute 1.1 (*)    All other components within normal limits  CBG MONITORING, ED - Abnormal; Notable for the following:    Glucose-Capillary 161 (*)    All other components within normal limits  URINE CULTURE  COMPREHENSIVE METABOLIC PANEL  LIPASE, BLOOD  URINALYSIS, ROUTINE W REFLEX MICROSCOPIC  POC OCCULT BLOOD, ED    Imaging Review No results found.   EKG Interpretation   Date/Time:  Friday January 12 2015 03:22:00 EST Ventricular Rate:  58 PR Interval:  148 QRS Duration: 82 QT Interval:  444 QTC Calculation: 436 R Axis:   13 Text Interpretation:  Sinus rhythm Probable left atrial enlargement  Confirmed by Marshon Bangs  MD, Gola Bribiesca ([id number]) on 01/12/2015 3:39:31 AM      MDM   Final diagnoses:  None    64 year old male with 2 days of nausea, vomiting, diarrhea and abdominal pain.  Also with headache.   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  Diffuse abdominal pain.  Patient noted to have a leukocytosis without a left shift.  His glucose is mildly elevated at 149.  No signs of DKA.  He has elevation.  AST and bilirubin.  Lipase is normal.  Plan for ultrasound to evaluate gallbladder.  He had an abdominal CT scan done in November as well without acute changes.  6:25 AM Patient reevaluated.  Sleeping when I enter the room.  He reports no improvement in abdominal pain or headache after round of medications which included Benadryl, Reglan and morphine.  Pain in abdomen appears to be more localized in the upper abdomen at this time.  Plan for right upper quadrant ultrasound.  Neuro exam is normal.  Patient received Dilaudid, and he can start with  ice chips as he says his mouth is dry  Kalman Drape, MD 01/12/15 7543  Kalman Drape, MD 01/12/15 [phone]

## 2015-01-12 NOTE — ED Provider Notes (Signed)
  Physical Exam  BP 165/63 mmHg  Pulse 64  Temp(Src) 98.3 F (36.8 C) (Oral)  Resp 18  SpO2 99%  Physical Exam  ED Course  Procedures  MDM Care assumed at sign out. Patient here with RUQ and R flank pain. UA + UTI. Had previous admission for pyelo. Sign out pending [place]. US showed biliary sludge, no acute chole. Given ceftriaxone. Still in pain. Prostate exam done by Dr. Sharol Given, diffusely tender. Consider prostatitis vs pyelo. Will admit.    Wandra Arthurs, MD 01/12/15 [phone]

## 2015-01-12 NOTE — ED Notes (Signed)
Patient presents from home via [place] for N/V/D x2 days. Abdominal pain, soft, tender.   168/78, CBG 165, 76hr, 95% RA.

## 2015-01-12 NOTE — ED Notes (Signed)
Pt was made aware of needing to get a urine sample. Pt not able at this time will attempt again RN aware

## 2015-01-12 NOTE — ED Notes (Signed)
Bed: JQ96 Expected date:  Expected time:  Means of arrival:  Comments: EMS 64yo M abd pain

## 2015-01-12 NOTE — H&P (Signed)
History and Physical:    [name] [name] DOB: [birth year] DOA: 01/12/2015  Referring physician: Dr. Darl Householder PCP: Philis Fendt, MD   Chief Complaint: Abdominal pain  History of Present Illness:   [name] is an 64 y.o. male with a PMH of uncontrolled type II DM, pituitary adenoma, hypertension, and polysubstance abuse who presented to the ER 01/12/15 chief complaint of a 3 day history of abdominal pain, described as constant, right upper quadrant/lower quadrant and flank area, associated with nausea and vomiting, diarrhea (describes stools as black).  He has had "no appetite".  He also has had a 3 day history of dysuria.  Denies current alcohol abuse.  Upon initial evaluation in the ED, the patient was afebrile with a WBC of 18, and a urinalysis significant for nitrites and leukocytes. On physical exam, the EDP reports tenderness to the prostate but no melanotic stool.  ROS:   Constitutional: No fever, + chills;  Appetite diminished; + weight loss, no weight gain, no fatigue.  HEENT: No blurry vision, no diplopia, no pharyngitis, no dysphagia CV: No chest pain, no palpitations, no PND, no orthopnea, no edema.  Resp: No SOB, no cough, no pleuritic pain. GI: + nausea, + vomiting, + diarrhea, + melena, no hematochezia, no constipation, + abdominal pain.  GU: + dysuria, no hematuria, no frequency, no urgency. MSK: no myalgias, no arthralgias.  Neuro:  No headache, no focal neurological deficits, no history of seizures.  Psych: No depression, no anxiety.  Endo: No heat intolerance, no cold intolerance, no polyuria, no polydipsia  Skin: + rashes, no skin lesions.  Heme: No easy bruising.  Travel history: No recent travel.   Past Medical History:   Past Medical History  Diagnosis Date  . Hypertension   . Diabetes mellitus   . Brain tumor   . CVA (cerebral vascular accident)   . Narcotic abuse   . CHF (congestive heart failure)   . TIA (transient ischemic attack) 09/06/2012    . Vertigo 09/06/2012  . HTN (hypertension), malignant 09/06/2012  . Tobacco use disorder 12/10/2013  . Alcohol dependence 12/10/2013  . Angioedema of lips 12/24/2013    Past Surgical History:   History reviewed. No pertinent past surgical history.  Social History:   History   Social History  . Marital Status: Legally Separated    Spouse Name: N/A    Number of Children: N/A  . Years of Education: N/A   Occupational History  . Not on file.   Social History Main Topics  . Smoking status: Current Every Day Smoker  . Smokeless tobacco: Current User  . Alcohol Use: 0.0 oz/week    0 Not specified per week     Comment: 6 pack a week  . Drug Use: Yes     Comment: Heroin - one month ago  . Sexual Activity: Not on file   Other Topics Concern  . Not on file   Social History Narrative   Single.  Lives alone.      Family history:   Family History  Problem Relation Age of Onset  . CAD Father   . Diabetes Brother   . Diabetes Sister   . Hypertension Father   . Hypertension Mother     Allergies   Review of patient's allergies indicates no known allergies.  Current Medications:   Prior to Admission medications   Medication Sig Start Date End Date Taking? Authorizing Provider  acetaminophen (TYLENOL) 500 MG tablet Take 500 mg  by mouth every 6 (six) hours as needed for headache.   Yes Historical Provider, MD  amLODipine (NORVASC) 10 MG tablet Take 10 mg by mouth daily.   Yes Historical Provider, MD  aspirin EC 81 MG tablet Take 81 mg by mouth daily.   Yes Historical Provider, MD  b complex vitamins tablet Take 1 tablet by mouth daily.   Yes Historical Provider, MD  hydrALAZINE (APRESOLINE) 50 MG tablet Take 1 tablet (50 mg total) by mouth every 8 (eight) hours. 12/27/13  Yes Debbe Odea, MD  isosorbide dinitrate (ISORDIL) 10 MG tablet Take 1 tablet (10 mg total) by mouth 3 (three) times daily. 12/27/13  Yes Debbe Odea, MD  metFORMIN (GLUCOPHAGE) 500 MG tablet Take 500  mg by mouth 2 (two) times daily with a meal.   Yes Historical Provider, MD  oxyCODONE-acetaminophen (PERCOCET) 5-325 MG per tablet Take 1 tablet by mouth every 4 (four) hours as needed for severe pain. 09/01/14  Yes Tatyana A Kirichenko, PA-C  pravastatin (PRAVACHOL) 20 MG tablet Take 20 mg by mouth at bedtime.   Yes Historical Provider, MD  ciprofloxacin (CIPRO) 500 MG tablet Take 1 tablet (500 mg total) by mouth 2 (two) times daily. Patient not taking: Reported on 01/12/2015 11/04/14   Caren Griffins, MD    Physical Exam:   Filed Vitals:   01/12/15 1200 01/12/15 1330 01/12/15 1409 01/12/15 1439  BP: 128/59 151/55 160/62 137/51  Pulse: 97 82 79   Temp: 97.5 F (36.4 C)  98.3 F (36.8 C) 98.8 F (37.1 C)  TempSrc: Oral  Oral Oral  Resp: 20 18 18 18   SpO2: 97% 97% 98% 97%     Physical Exam: Blood pressure 137/51, pulse 79, temperature 98.8 F (37.1 C), temperature source Oral, resp. rate 18, SpO2 97 %. Gen: No acute distress. Head: Normocephalic, atraumatic. Eyes: PERRL, EOMI, sclerae nonicteric. Mouth: Oropharynx clear with poor dentition. Neck: Supple, no thyromegaly, no lymphadenopathy, no jugular venous distention. Chest: Lungs clear to auscultation bilaterally. CV: Heart sounds are regular. No murmurs, rubs, or gallops. Abdomen: Soft, nontender, nondistended with normal active bowel sounds. Extremities: Extremities are without clubbing, edema, or cyanosis. Skin: Warm and dry. Neuro: Alert and oriented times 3; cranial nerves II through XII grossly intact. Psych: Mood and affect normal.   Data Review:    Labs: Basic Metabolic Panel:  Recent Labs Lab 01/12/15 0329  NA 138  K 3.3*  CL 104  CO2 22  GLUCOSE 149*  BUN 24*  CREATININE 0.88  CALCIUM 9.5   Liver Function Tests:  Recent Labs Lab 01/12/15 0329  AST 49*  ALT 45  ALKPHOS 68  BILITOT 1.4*  PROT 8.4*  ALBUMIN 4.3    Recent Labs Lab 01/12/15 0329  LIPASE 45   CBC:  Recent Labs Lab  01/12/15 0329  WBC 18.0*  NEUTROABS 12.4*  HGB 14.5  HCT 43.6  MCV 72.7*  PLT 313    BNP (last 3 results)  Recent Labs  10/15/14 0721 11/03/14 0042  PROBNP 73.0 1363.0*   CBG:  Recent Labs Lab 01/12/15 0346 01/12/15 0921 01/12/15 1235 01/12/15 1252  GLUCAP 161* 136* 131* 138*   Urinalysis    Component Value Date/Time   COLORURINE YELLOW 01/12/2015 [place] 01/12/2015 0635   LABSPEC 1.015 01/12/2015 [place] 7.5 01/12/2015 [place] 01/12/2015 [place] NEGATIVE 01/12/2015 [place] NEGATIVE 01/12/2015 [place] 01/12/2015 8185  PROTEINUR NEGATIVE Feb 11, 2015 0635   UROBILINOGEN 0.2 02-11-15 0635   NITRITE POSITIVE* 11-Feb-2015 0635   LEUKOCYTESUR MODERATE* 02-11-2015 0635     Radiographic Studies: US Abdomen Limited Ruq  02-11-2015   CLINICAL DATA:  Right upper quadrant pain.  EXAM: US ABDOMEN LIMITED - RIGHT UPPER QUADRANT  COMPARISON:  CT 08/15/2014, 05/15/2014.  FINDINGS: Gallbladder:  Sludge.  Gallbladder wall thickness 2 mm.  Negative Murphy sign.  Common bile duct:  Diameter: 4 mm.  Liver:  Stable 2.3 cm complex cyst left hepatic lobe.  IMPRESSION: 1. Sludge in the gallbladder. No stones identified. No gallbladder distention or gallbladder wall thickening. No pericholecystic fluid collection. Negative Murphy's sign . No biliary distention. 2. Stable 2.3 cm complex cyst left hepatic lobe.   Electronically Signed   By: Marcello Moores  Register   On: 11-Feb-2015 07:44    EKG: Independently reviewed. Normal sinus rhythm at 58 bpm. Left atrial enlargement. No ST-T wave changes.   Assessment/Plan:   Principal Problem:   Pyelonephritis / prostatitis / leukocytosis  Continue empiric Rocephin.  Follow-up cultures.  Active Problems:   Diabetes mellitus type 2, uncontrolled  Hold metformin and continue SSI, insulin sensitive scale.  Hemoglobin A1c 6.7% 08/16/14.    Pain in the side / chronic  pain  Acute pain in the side likely from pyelonephritis. Toradol ordered as needed.  Continue as needed Percocet. Avoid IV narcotics.    Pituitary adenoma  Stable.    Essential hypertension  Continue Norvasc and hydralazine and Isordil.    Hypokalemia  Potassium added to IV fluids.    DVT prophylaxis  Treatment with Lovenox.  Code Status: Full. Family Communication: Sister Sandi Mealy is emergency contact. No family present. Disposition Plan: Home when stable.  Time spent: One hour.  RAMA,CHRISTINA [facility] Pager [phone] Cell: [phone]   If 7PM-7AM, please contact night-coverage [email or web address] Password [email or web address] 11-Feb-2015, 2:52 PM

## 2015-01-13 DIAGNOSIS — R109 Unspecified abdominal pain: Secondary | ICD-10-CM

## 2015-01-13 LAB — GLUCOSE, CAPILLARY
Glucose-Capillary: 152 mg/dL — ABNORMAL HIGH (ref 70–99)
Glucose-Capillary: 114 mg/dL — ABNORMAL HIGH (ref 70–99)
Glucose-Capillary: 123 mg/dL — ABNORMAL HIGH (ref 70–99)
Glucose-Capillary: 161 mg/dL — ABNORMAL HIGH (ref 70–99)

## 2015-01-13 LAB — CBC
HEMATOCRIT: 40.3 % (ref 39.0–52.0)
Hemoglobin: 12.7 g/dL — ABNORMAL LOW (ref 13.0–17.0)
MCH: 23.5 pg — ABNORMAL LOW (ref 26.0–34.0)
MCHC: 31.5 g/dL (ref 30.0–36.0)
MCV: 74.5 fL — ABNORMAL LOW (ref 78.0–100.0)
Platelets: 284 10*3/uL (ref 150–400)
RBC: 5.41 MIL/uL (ref 4.22–5.81)
RDW: 14.5 % (ref 11.5–15.5)
WBC: 13.4 10*3/uL — ABNORMAL HIGH (ref 4.0–10.5)

## 2015-01-13 LAB — URINE CULTURE: Colony Count: >=100000

## 2015-01-13 LAB — BASIC METABOLIC PANEL WITH GFR
Anion gap: 9 (ref 5–15)
BUN: 24 mg/dL — ABNORMAL HIGH (ref 6–23)
CO2: 23 mmol/L (ref 19–32)
Calcium: 8.5 mg/dL (ref 8.4–10.5)
Chloride: 104 meq/L (ref 96–112)
Creatinine, Ser: 0.95 mg/dL (ref 0.50–1.35)
GFR calc Af Amer: >90 mL/min (ref >90)
GFR calc non Af Amer: 87 mL/min — ABNORMAL LOW (ref >90)
Glucose, Bld: 136 mg/dL — ABNORMAL HIGH (ref 70–99)
Potassium: 3.5 mmol/L (ref 3.5–5.1)
Sodium: 136 mmol/L (ref 135–145)

## 2015-01-13 MED ORDER — TRAZODONE HCL 50 MG PO TABS
100.0000 mg | ORAL_TABLET | Freq: Every evening | ORAL | Status: DC | PRN
  Administered 2015-01-13: 100 mg via ORAL
  Filled 2015-01-13: qty 2

## 2015-01-13 MED ORDER — OXYCODONE-ACETAMINOPHEN 5-325 MG PO TABS
2.0000 | ORAL_TABLET | ORAL | Status: DC | PRN
  Administered 2015-01-13 – 2015-01-15 (×8): 2 via ORAL
  Filled 2015-01-13 (×8): qty 2

## 2015-01-13 MED ORDER — PHENAZOPYRIDINE HCL 200 MG PO TABS
200.0000 mg | ORAL_TABLET | Freq: Three times a day (TID) | ORAL | Status: DC
  Administered 2015-01-13 – 2015-01-15 (×6): 200 mg via ORAL
  Filled 2015-01-13 (×9): qty 1

## 2015-01-13 NOTE — Progress Notes (Signed)
Progress Note   MOUSA PROUT [id number] DOB: 06-May-1951 DOA: 01/12/2015 PCP: Philis Fendt, MD   Brief Narrative:   [name] is an 64 y.o. male with a PMH of uncontrolled type II DM, pituitary adenoma, hypertension, and polysubstance abuse who presented to the ER 01/12/15 chief complaint of a 3 day history of abdominal pain, described as constant, right upper quadrant/lower quadrant and flank area, associated with nausea and vomiting, diarrhea (describes stools as black--- guaiac negative on exam).   Assessment/Plan:   Principal Problem:  Pyelonephritis / prostatitis / leukocytosis   Continue empiric Rocephin.  Follow-up cultures.  Add Pyridium due to complaints of bladder pain/spasms.  Reports hematuria but no blood in urine on urinalysis.  Active Problems:  Diabetes mellitus type 2, uncontrolled  Hold metformin and continue SSI, insulin sensitive scale.  Hemoglobin A1c 6.7% 08/16/14.  CBGs 129-138.   Pain in the side / chronic pain  Acute pain in the side likely from pyelonephritis. Toradol ordered as needed.  Continue as needed Percocet. Avoid IV narcotics given history of polysubstance abuse.   Pituitary adenoma  Stable.   Essential hypertension  Continue Norvasc and hydralazine and Isordil.   Hypokalemia  Potassium added to IV fluids.   DVT prophylaxis  Treatment with Lovenox.  Code Status: Full. Family Communication: Sister Sandi Mealy is emergency contact. No family present. Disposition Plan: Home when stable.   IV Access:    Peripheral IV   Procedures and diagnostic studies:   US Abdomen Limited Ruq 01/12/2015: 1. Sludge in the gallbladder. No stones identified. No gallbladder distention or gallbladder wall thickening. No pericholecystic fluid collection. Negative Murphy's sign . No biliary distention. 2. Stable 2.3 cm complex cyst left hepatic lobe.     Medical Consultants:    None.  Anti-Infectives:     Rocephin 01/12/15--->  Subjective:   [name] tells me he is having blood in his urine and ongoing lower abdominal pain.  Bowels moving, normal in appearance per self report.  Reports some nausea and vomiting.  Says he had trouble sleeping last night.  Objective:    Filed Vitals:   01/12/15 1409 01/12/15 1439 01/12/15 2140 01/13/15 0519  BP: 160/62 137/51 154/62 160/58  Pulse: 79  63 59  Temp: 98.3 F (36.8 C) 98.8 F (37.1 C) 98.4 F (36.9 C) 99.2 F (37.3 C)  TempSrc: Oral Oral Oral Oral  Resp: 18 18 18 18   SpO2: 98% 97% 100% 99%    Intake/Output Summary (Last 24 hours) at 01/13/15 0734 Last data filed at 01/13/15 0520  Gross per 24 hour  Intake    850 ml  Output   1150 ml  Net   -300 ml    Exam: Gen:  NAD Cardiovascular:  RRR, No M/R/G Respiratory:  Lungs CTAB Gastrointestinal:  Abdomen soft, NT/ND, + BS Extremities:  No C/E/C   Data Reviewed:    Labs: Basic Metabolic Panel:  Recent Labs Lab 01/12/15 0329 01/13/15 0516  NA 138 136  K 3.3* 3.5  CL 104 104  CO2 22 23  GLUCOSE 149* 136*  BUN 24* 24*  CREATININE 0.88 0.95  CALCIUM 9.5 8.5   GFR CrCl cannot be calculated (Unknown ideal weight.). Liver Function Tests:  Recent Labs Lab 01/12/15 0329  AST 49*  ALT 45  ALKPHOS 68  BILITOT 1.4*  PROT 8.4*  ALBUMIN 4.3    Recent Labs Lab 01/12/15 0329  LIPASE 45   CBC:  Recent Labs Lab  01/12/15 0329 01/13/15 0516  WBC 18.0* 13.4*  NEUTROABS 12.4*  --   HGB 14.5 12.7*  HCT 43.6 40.3  MCV 72.7* 74.5*  PLT 313 284   BNP (last 3 results)  Recent Labs  10/15/14 0721 11/03/14 0042  PROBNP 73.0 1363.0*   CBG:  Recent Labs Lab 01/12/15 0921 01/12/15 1235 01/12/15 1252 01/12/15 1714 01/12/15 2139  GLUCAP 136* 131* 138* 129* 129*   Microbiology No results found for this or any previous visit (from the past 240 hour(s)).   Medications:   . amLODipine  10 mg Oral Daily  . aspirin EC  81 mg Oral Daily  .  cefTRIAXone (ROCEPHIN)  IV  1 g Intravenous Q24H  . enoxaparin (LOVENOX) injection  40 mg Subcutaneous Q24H  . hydrALAZINE  50 mg Oral 3 times per day  . insulin aspart  0-5 Units Subcutaneous QHS  . insulin aspart  0-9 Units Subcutaneous TID WC  . isosorbide dinitrate  10 mg Oral TID  . pravastatin  20 mg Oral QHS   Continuous Infusions: . 0.9 % NaCl with KCl 40 mEq / L 50 mL/hr (01/13/15 0423)    Time spent: 25 minutes.   LOS: 1 day   Yanique Mulvihill  [facility] Pager [phone]. If unable to reach me by pager, please call my cell phone at [phone].  *Please refer to [email or web address], password [email or web address] to get updated schedule on who will round on this patient, as hospitalists switch teams weekly. If 7PM-7AM, please contact night-coverage at [email or web address], password [email or web address] for any overnight needs.  01/13/2015, 7:34 AM

## 2015-01-14 LAB — CBC
HCT: 38 % — ABNORMAL LOW (ref 39.0–52.0)
Hemoglobin: 11.9 g/dL — ABNORMAL LOW (ref 13.0–17.0)
MCH: 23.3 pg — ABNORMAL LOW (ref 26.0–34.0)
MCHC: 31.3 g/dL (ref 30.0–36.0)
MCV: 74.5 fL — ABNORMAL LOW (ref 78.0–100.0)
Platelets: 254 10*3/uL (ref 150–400)
RBC: 5.1 MIL/uL (ref 4.22–5.81)
RDW: 14.3 % (ref 11.5–15.5)
WBC: 9.7 10*3/uL (ref 4.0–10.5)

## 2015-01-14 LAB — BASIC METABOLIC PANEL
Anion gap: 11 (ref 5–15)
BUN: 15 mg/dL (ref 6–23)
CO2: 22 mmol/L (ref 19–32)
Calcium: 8.7 mg/dL (ref 8.4–10.5)
Chloride: 106 mEq/L (ref 96–112)
Creatinine, Ser: 0.85 mg/dL (ref 0.50–1.35)
GFR calc non Af Amer: >90 mL/min (ref >90)
GLUCOSE: 141 mg/dL — AB (ref 70–99)
Potassium: 3.8 mmol/L (ref 3.5–5.1)
Sodium: 139 mmol/L (ref 135–145)

## 2015-01-14 LAB — GLUCOSE, CAPILLARY
GLUCOSE-CAPILLARY: 139 mg/dL — AB (ref 70–99)
Glucose-Capillary: 118 mg/dL — ABNORMAL HIGH (ref 70–99)
Glucose-Capillary: 126 mg/dL — ABNORMAL HIGH (ref 70–99)
Glucose-Capillary: 127 mg/dL — ABNORMAL HIGH (ref 70–99)

## 2015-01-14 MED ORDER — TAMSULOSIN HCL 0.4 MG PO CAPS
0.4000 mg | ORAL_CAPSULE | Freq: Every day | ORAL | Status: DC
  Administered 2015-01-14: 0.4 mg via ORAL
  Filled 2015-01-14 (×2): qty 1

## 2015-01-14 MED ORDER — AMPICILLIN 250 MG PO CAPS
250.0000 mg | ORAL_CAPSULE | Freq: Four times a day (QID) | ORAL | Status: DC
  Administered 2015-01-14 – 2015-01-15 (×5): 250 mg via ORAL
  Filled 2015-01-14 (×10): qty 1

## 2015-01-14 NOTE — Progress Notes (Signed)
Progress Note   BENFORD ASCH TFT:[id number] DOB: Nov 24, 1951 DOA: 01/12/2015 PCP: Philis Fendt, MD   Brief Narrative:   [name] is an 64 y.o. male with a PMH of uncontrolled type II DM, pituitary adenoma, hypertension, and polysubstance abuse who presented to the ER 01/12/15 chief complaint of a 3 day history of abdominal pain, described as constant, right upper quadrant/lower quadrant and flank area, associated with nausea and vomiting, diarrhea (describes stools as black--- guaiac negative on exam).   Assessment/Plan:   Principal Problem:  Pyelonephritis / prostatitis / leukocytosis   Initially treated with Rocephin. We'll narrow to ampicillin.  Urine cultures grew group B strep.  Continue Pyridium due to complaints of bladder pain/spasms.  Start Flomax.  Will need outpatient urology follow up for ? BPH.  Active Problems:  Diabetes mellitus type 2, uncontrolled  Hold metformin and continue SSI, insulin sensitive scale.  Hemoglobin A1c 6.7% 08/16/14.  CBGs 123-161.   Pain in the side / chronic pain  Acute pain in the side likely from pyelonephritis. Toradol ordered as needed.  Continue as needed Percocet. Avoid IV narcotics given history of polysubstance abuse.   Pituitary adenoma  Stable.   Essential hypertension  Continue Norvasc and hydralazine and Isordil.   Hypokalemia  Resolved with supplementation.   DVT prophylaxis  Treatment with Lovenox.  Code Status: Full. Family Communication: Sister Sandi Mealy updated at bedside. Disposition Plan: Home when stable.   IV Access:    Peripheral IV   Procedures and diagnostic studies:   US Abdomen Limited Ruq 01/12/2015: 1. Sludge in the gallbladder. No stones identified. No gallbladder distention or gallbladder wall thickening. No pericholecystic fluid collection. Negative Murphy's sign . No biliary distention. 2. Stable 2.3 cm complex cyst left hepatic lobe.     Medical  Consultants:    None.  Anti-Infectives:    Rocephin 01/12/15--->  Subjective:   [name] feels better.  Pain improved.  Appetite improving.  Still with occasional nausea.  No vomiting.  Objective:    Filed Vitals:   01/13/15 1535 01/13/15 2119 01/14/15 0517 01/14/15 0605  BP: 142/64 150/53 153/81 118/54  Pulse:  69 75 66  Temp:  98.1 F (36.7 C) 98.4 F (36.9 C)   TempSrc:  Oral Oral   Resp:  18 16   SpO2:  100% 98%     Intake/Output Summary (Last 24 hours) at 01/14/15 0730 Last data filed at 01/14/15 0518  Gross per 24 hour  Intake 1888.33 ml  Output   1375 ml  Net 513.33 ml    Exam: Gen:  NAD Cardiovascular:  RRR, No M/R/G Respiratory:  Lungs CTAB Gastrointestinal:  Abdomen soft, NT/ND, + BS Extremities:  No C/E/C   Data Reviewed:    Labs: Basic Metabolic Panel:  Recent Labs Lab 01/12/15 0329 01/13/15 0516 01/14/15 0511  NA 138 136 139  K 3.3* 3.5 3.8  CL 104 104 106  CO2 22 23 22   GLUCOSE 149* 136* 141*  BUN 24* 24* 15  CREATININE 0.88 0.95 0.85  CALCIUM 9.5 8.5 8.7   GFR CrCl cannot be calculated (Unknown ideal weight.). Liver Function Tests:  Recent Labs Lab 01/12/15 0329  AST 49*  ALT 45  ALKPHOS 68  BILITOT 1.4*  PROT 8.4*  ALBUMIN 4.3    Recent Labs Lab 01/12/15 0329  LIPASE 45   CBC:  Recent Labs Lab 01/12/15 0329 01/13/15 0516 01/14/15 0511  WBC 18.0* 13.4* 9.7  NEUTROABS 12.4*  --   --  HGB 14.5 12.7* 11.9*  HCT 43.6 40.3 38.0*  MCV 72.7* 74.5* 74.5*  PLT 313 284 254   BNP (last 3 results)  Recent Labs  10/15/14 0721 11/03/14 0042  PROBNP 73.0 1363.0*   CBG:  Recent Labs Lab 01/12/15 2139 01/13/15 0746 01/13/15 1204 01/13/15 1703 01/13/15 2118  GLUCAP 129* 152* 114* 123* 161*   Microbiology Recent Results (from the past 240 hour(s))  Urine culture     Status: None   Collection Time: 01/12/15  6:35 AM  Result Value Ref Range Status   Specimen Description URINE, CLEAN CATCH   Final   Special Requests NONE  Final   Colony Count   Final    >=100,000 COLONIES/ML Performed at [facility]    Culture   Final    GROUP B STREP(S.AGALACTIAE)ISOLATED Note: TESTING AGAINST S. AGALACTIAE NOT ROUTINELY PERFORMED DUE TO PREDICTABILITY OF AMP/PEN/VAN SUSCEPTIBILITY. Performed at [facility]    Report Status 01/13/2015 FINAL  Final     Medications:   . amLODipine  10 mg Oral Daily  . aspirin EC  81 mg Oral Daily  . cefTRIAXone (ROCEPHIN)  IV  1 g Intravenous Q24H  . enoxaparin (LOVENOX) injection  40 mg Subcutaneous Q24H  . hydrALAZINE  50 mg Oral 3 times per day  . insulin aspart  0-5 Units Subcutaneous QHS  . insulin aspart  0-9 Units Subcutaneous TID WC  . isosorbide dinitrate  10 mg Oral TID  . phenazopyridine  200 mg Oral TID WC  . pravastatin  20 mg Oral QHS   Continuous Infusions: . 0.9 % NaCl with KCl 40 mEq / L 50 mL/hr (01/14/15 0008)    Time spent: 25 minutes.   LOS: 2 days   RAMA,CHRISTINA  [facility] Pager [phone]. If unable to reach me by pager, please call my cell phone at [phone].  *Please refer to [email or web address], password [email or web address] to get updated schedule on who will round on this patient, as hospitalists switch teams weekly. If 7PM-7AM, please contact night-coverage at [email or web address], password [email or web address] for any overnight needs.  01/14/2015, 7:30 AM

## 2015-01-14 NOTE — Care Management (Signed)
CARE MANAGEMENT NOTE 01/14/2015  Patient:  [name], [name]   Account Number:  [id number]  Date Initiated:  01/14/2015  Documentation initiated by:  Apolonio Schneiders  Subjective/Objective Assessment:   abdominal pain     Action/Plan:   Anticipated DC Date:  01/15/2015   Anticipated DC Plan:  [place]  CM consult      Choice offered to / List presented to:             Status of service:  Completed, signed off Medicare Important Message given?   (If response is "NO", the following Medicare IM given date fields will be blank) Date Medicare IM given:   Medicare IM given by:   Date Additional Medicare IM given:   Additional Medicare IM given by:    Discharge Disposition:  HOME/SELF CARE  Per UR Regulation:    If discussed at Long Length of Stay Meetings, dates discussed:    Comments:  01/14/15 0940 - CM spoke with patient and his sister. Reports he does not receive any skilled home health services. His sister is his personal care assistant. States he does not need any other home care services. They were unable to provide the name of the agency his sister works for. States she is going to change agencies soon. Venita Sheffield RN BSN CCM [phone]

## 2015-01-15 LAB — GLUCOSE, CAPILLARY: GLUCOSE-CAPILLARY: 119 mg/dL — AB (ref 70–99)

## 2015-01-15 MED ORDER — AMPICILLIN 250 MG PO CAPS: 250.0000 mg | ORAL_CAPSULE | Freq: Four times a day (QID) | ORAL | Status: DC

## 2015-01-15 MED ORDER — PHENAZOPYRIDINE HCL 200 MG PO TABS: 200.0000 mg | ORAL_TABLET | Freq: Three times a day (TID) | ORAL | Status: DC

## 2015-01-15 MED ORDER — TAMSULOSIN HCL 0.4 MG PO CAPS: 0.4000 mg | ORAL_CAPSULE | Freq: Every day | ORAL | Status: DC

## 2015-01-15 MED ORDER — TRAZODONE HCL 100 MG PO TABS: 100.0000 mg | ORAL_TABLET | Freq: Every evening | ORAL | Status: DC | PRN

## 2015-01-15 NOTE — Discharge Summary (Signed)
Physician Discharge Summary  [name] AST:[id number] DOB: 1951/06/19 DOA: 01/12/2015  PCP: Philis Fendt, MD  Admit date: 01/12/2015 Discharge date: 01/15/2015   Recommendations for Outpatient Follow-Up:   1. F/U with PCP in 2 weeks, consider outpatient referral to urology to assess prostate.   Discharge Diagnosis:   Principal Problem:    Pyelonephritis versus prostatitis Active Problems:    Diabetes mellitus type 2, uncontrolled    Pain in the side    Pituitary adenoma    Leukocytosis    Chronic pain    Essential hypertension    Hypokalemia  Discharge Condition: Improved.  Diet recommendation: Carbohydrate-modified.     History of Present Illness:   [name] is an 64 y.o. male with a PMH of uncontrolled type II DM, pituitary adenoma, hypertension, and polysubstance abuse who presented to the ER 01/12/15 chief complaint of a 3 day history of abdominal pain, described as constant, right upper quadrant/lower quadrant and flank area, associated with nausea and vomiting, diarrhea (described stools as black--- guaiac negative on exam).   Hospital Course by Problem:   Principal Problem:  Pyelonephritis / prostatitis / leukocytosis   Initially treated with Rocephin. Narrowed to oral ampicillin 01/14/15. Urine cultures grew group B strep.  Continue Pyridium due to complaints of bladder pain/spasms.  Discharge home on Flomax. Will need outpatient urology follow up for ? BPH.  Leukocytosis resolved by discharge, and the patient was clinically improved.  Active Problems:  Diabetes mellitus type 2, uncontrolled  Controlled. Manage with SSI while in the hospital. Resume metformin at discharge.  Hemoglobin A1c 6.7% 08/16/14.   Pain in the side / chronic pain  Acute pain in the side likely from pyelonephritis. Toradol ordered as needed.   Pituitary adenoma  Stable.   Essential hypertension  Controlled on Norvasc and hydralazine and  Isordil.   Hypokalemia  Resolved with supplementation.    Medical Consultants:    None.   Discharge Exam:   Filed Vitals:   01/15/15 0604  BP:   Pulse:   Temp: 98 F (36.7 C)  Resp:    Filed Vitals:   01/14/15 1500 01/14/15 2058 01/15/15 0546 01/15/15 0604  BP: 120/52 138/58 118/86   Pulse: 64 71 66   Temp: 98.8 F (37.1 C) 97.5 F (36.4 C)  98 F (36.7 C)  TempSrc: Oral Oral  Oral  Resp: 18 16    SpO2: 99% 96%  100%    Gen:  NAD Cardiovascular:  RRR, No M/R/G Respiratory: Lungs CTAB Gastrointestinal: Abdomen soft, NT/ND with normal active bowel sounds. Extremities: No C/E/C   The results of significant diagnostics from this hospitalization (including imaging, microbiology, ancillary and laboratory) are listed below for reference.     Procedures and Diagnostic Studies:   US Abdomen Limited Ruq 01/12/2015: 1. Sludge in the gallbladder. No stones identified. No gallbladder distention or gallbladder wall thickening. No pericholecystic fluid collection. Negative Murphy's sign . No biliary distention. 2. Stable 2.3 cm complex cyst left hepatic lobe.   Labs:   Basic Metabolic Panel:  Recent Labs Lab 01/12/15 0329 01/13/15 0516 01/14/15 0511  NA 138 136 139  K 3.3* 3.5 3.8  CL 104 104 106  CO2 22 23 22   GLUCOSE 149* 136* 141*  BUN 24* 24* 15  CREATININE 0.88 0.95 0.85  CALCIUM 9.5 8.5 8.7   GFR CrCl cannot be calculated (Unknown ideal weight.). Liver Function Tests:  Recent Labs Lab 01/12/15 0329  AST 49*  ALT 45  ALKPHOS 68  BILITOT 1.4*  PROT 8.4*  ALBUMIN 4.3    Recent Labs Lab 01/12/15 0329  LIPASE 45   CBC:  Recent Labs Lab 01/12/15 0329 01/13/15 0516 01/14/15 0511  WBC 18.0* 13.4* 9.7  NEUTROABS 12.4*  --   --   HGB 14.5 12.7* 11.9*  HCT 43.6 40.3 38.0*  MCV 72.7* 74.5* 74.5*  PLT 313 284 254   CBG:  Recent Labs Lab 01/14/15 0744 01/14/15 1206 01/14/15 1725 01/14/15 2121 01/15/15 0731  GLUCAP 139* 118*  126* 127* 119*   Microbiology Recent Results (from the past 240 hour(s))  Urine culture     Status: None   Collection Time: 01/12/15  6:35 AM  Result Value Ref Range Status   Specimen Description URINE, CLEAN CATCH  Final   Special Requests NONE  Final   Colony Count   Final    >=100,000 COLONIES/ML Performed at [facility]    Culture   Final    GROUP B STREP(S.AGALACTIAE)ISOLATED Note: TESTING AGAINST S. AGALACTIAE NOT ROUTINELY PERFORMED DUE TO PREDICTABILITY OF AMP/PEN/VAN SUSCEPTIBILITY. Performed at [facility]    Report Status 01/13/2015 FINAL  Final     Discharge Instructions:   Discharge Instructions    Activity as tolerated - No restrictions    Complete by:  As directed      Call MD for:  persistant nausea and vomiting    Complete by:  As directed      Call MD for:  severe uncontrolled pain    Complete by:  As directed      Call MD for:  temperature >100.4    Complete by:  As directed      Diet Carb Modified    Complete by:  As directed      Discharge instructions    Complete by:  As directed   You were cared for by Dr. Jacquelynn Cree  (a hospitalist) during your hospital stay. If you have any questions about your discharge medications or the care you received while you were in the hospital after you are discharged, you can call the unit and ask to speak with the hospitalist on call if the hospitalist that took care of you is not available. Once you are discharged, your primary care physician will handle any further medical issues. Please note that NO REFILLS for any discharge medications will be authorized once you are discharged, as it is imperative that you return to your primary care physician (or establish a relationship with a primary care physician if you do not have one) for your aftercare needs so that they can reassess your need for medications and monitor your lab values.  Any outstanding tests can be reviewed by your PCP at your follow up  visit.  It is also important to review any medicine changes with your PCP.  Please bring these d/c instructions with you to your next visit so your physician can review these changes with you.  If you do not have a primary care physician, you can call [phone] for a physician referral.  It is highly recommended that you obtain a PCP for hospital follow up.            Medication List    STOP taking these medications        ciprofloxacin 500 MG tablet  Commonly known as:  CIPRO      TAKE these medications        acetaminophen 500 MG tablet  Commonly  known as:  TYLENOL  Take 500 mg by mouth every 6 (six) hours as needed for headache.     amLODipine 10 MG tablet  Commonly known as:  NORVASC  Take 10 mg by mouth daily.     ampicillin 250 MG capsule  Commonly known as:  PRINCIPEN  Take 1 capsule (250 mg total) by mouth every 6 (six) hours.     aspirin EC 81 MG tablet  Take 81 mg by mouth daily.     b complex vitamins tablet  Take 1 tablet by mouth daily.     hydrALAZINE 50 MG tablet  Commonly known as:  APRESOLINE  Take 1 tablet (50 mg total) by mouth every 8 (eight) hours.     isosorbide dinitrate 10 MG tablet  Commonly known as:  ISORDIL  Take 1 tablet (10 mg total) by mouth 3 (three) times daily.     metFORMIN 500 MG tablet  Commonly known as:  GLUCOPHAGE  Take 500 mg by mouth 2 (two) times daily with a meal.     oxyCODONE-acetaminophen 5-325 MG per tablet  Commonly known as:  PERCOCET  Take 1 tablet by mouth every 4 (four) hours as needed for severe pain.     phenazopyridine 200 MG tablet  Commonly known as:  PYRIDIUM  Take 1 tablet (200 mg total) by mouth 3 (three) times daily with meals.     pravastatin 20 MG tablet  Commonly known as:  PRAVACHOL  Take 20 mg by mouth at bedtime.     tamsulosin 0.4 MG Caps capsule  Commonly known as:  FLOMAX  Take 1 capsule (0.4 mg total) by mouth daily.     traZODone 100 MG tablet  Commonly known as:  DESYREL  Take 1  tablet (100 mg total) by mouth at bedtime as needed for sleep.           Follow-up Information    Follow up with AVBUERE,EDWIN A, MD. Schedule an appointment as soon as possible for a visit in 2 weeks.   Specialty:  Internal Medicine   Why:  Hospital follow up, to evaluate your prostate and refer you to a urologist if needed.   Contact information:   [address] [place]  [phone]        Time coordinating discharge: 25 minutes.  Signed:  RAMA,CHRISTINA  Pager [phone] [facility] 01/15/2015, 7:55 AM

## 2015-01-15 NOTE — Progress Notes (Signed)
Patient discharged to home, all discharge medications and instructions reviewed and questions answered.  Patient to be assisted to vehicle by wheelchair.  

## 2015-01-15 NOTE — Discharge Instructions (Signed)
Prostatitis Prostatitis is redness, soreness, and puffiness (swelling) of the prostate gland. The prostate gland is the walnut-sized gland located just below your bladder. HOME CARE:   Take all medicines as told by your doctor.  Take warm-water baths (sitz baths) as told by your doctor. GET HELP IF:  Your symptoms get worse, not better.  You have a fever. GET HELP RIGHT AWAY IF:   You have chills.  You feel sick to your stomach (nauseous) or like you will throw up (vomit).  You feel lightheaded or like you will pass out (faint).  You are unable to pee (urinate).  You have blood or blood clumps (clots) in your pee (urine). MAKE SURE YOU:  Understand these instructions.  Will watch your condition.  Will get help right away if you are not doing well or get worse. Document Released: 06/15/2012 Document Revised: 08/17/2013 Document Reviewed: 07/04/2013 [facility] Patient Information 2015 [place], [place]. This information is not intended to replace advice given to you by your health care provider. Make sure you discuss any questions you have with your health care provider.

## 2015-01-26 ENCOUNTER — Emergency Department (HOSPITAL_COMMUNITY): Payer: Medicaid Other

## 2015-01-26 ENCOUNTER — Encounter (HOSPITAL_COMMUNITY): Payer: Self-pay | Admitting: Emergency Medicine

## 2015-01-26 ENCOUNTER — Inpatient Hospital Stay (HOSPITAL_COMMUNITY)
Admission: EM | Admit: 2015-01-26 | Discharge: 2015-01-30 | DRG: 190 | Disposition: A | Discharge status: Home / Self Care | Payer: Medicaid Other | Attending: Internal Medicine | Admitting: Internal Medicine

## 2015-01-26 DIAGNOSIS — R05 Cough: Secondary | ICD-10-CM | POA: Diagnosis present

## 2015-01-26 DIAGNOSIS — J189 Pneumonia, unspecified organism: Secondary | ICD-10-CM | POA: Insufficient documentation

## 2015-01-26 DIAGNOSIS — R079 Chest pain, unspecified: Secondary | ICD-10-CM

## 2015-01-26 DIAGNOSIS — E785 Hyperlipidemia, unspecified: Secondary | ICD-10-CM | POA: Diagnosis present

## 2015-01-26 DIAGNOSIS — R059 Cough, unspecified: Secondary | ICD-10-CM | POA: Diagnosis present

## 2015-01-26 DIAGNOSIS — Z8673 Personal history of transient ischemic attack (TIA), and cerebral infarction without residual deficits: Secondary | ICD-10-CM

## 2015-01-26 DIAGNOSIS — B952 Enterococcus as the cause of diseases classified elsewhere: Secondary | ICD-10-CM | POA: Diagnosis present

## 2015-01-26 DIAGNOSIS — F1011 Alcohol abuse, in remission: Secondary | ICD-10-CM | POA: Insufficient documentation

## 2015-01-26 DIAGNOSIS — I509 Heart failure, unspecified: Secondary | ICD-10-CM | POA: Insufficient documentation

## 2015-01-26 DIAGNOSIS — J441 Chronic obstructive pulmonary disease with (acute) exacerbation: Principal | ICD-10-CM | POA: Diagnosis present

## 2015-01-26 DIAGNOSIS — R0602 Shortness of breath: Secondary | ICD-10-CM

## 2015-01-26 DIAGNOSIS — N419 Inflammatory disease of prostate, unspecified: Secondary | ICD-10-CM | POA: Diagnosis present

## 2015-01-26 DIAGNOSIS — F17210 Nicotine dependence, cigarettes, uncomplicated: Secondary | ICD-10-CM | POA: Diagnosis present

## 2015-01-26 DIAGNOSIS — Z79899 Other long term (current) drug therapy: Secondary | ICD-10-CM

## 2015-01-26 DIAGNOSIS — N390 Urinary tract infection, site not specified: Secondary | ICD-10-CM

## 2015-01-26 DIAGNOSIS — I10 Essential (primary) hypertension: Secondary | ICD-10-CM | POA: Diagnosis present

## 2015-01-26 DIAGNOSIS — F17200 Nicotine dependence, unspecified, uncomplicated: Secondary | ICD-10-CM | POA: Diagnosis present

## 2015-01-26 DIAGNOSIS — E1165 Type 2 diabetes mellitus with hyperglycemia: Secondary | ICD-10-CM | POA: Diagnosis present

## 2015-01-26 DIAGNOSIS — Z7982 Long term (current) use of aspirin: Secondary | ICD-10-CM

## 2015-01-26 DIAGNOSIS — D352 Benign neoplasm of pituitary gland: Secondary | ICD-10-CM | POA: Diagnosis present

## 2015-01-26 DIAGNOSIS — E119 Type 2 diabetes mellitus without complications: Secondary | ICD-10-CM | POA: Diagnosis present

## 2015-01-26 DIAGNOSIS — R1030 Lower abdominal pain, unspecified: Secondary | ICD-10-CM

## 2015-01-26 DIAGNOSIS — J440 Chronic obstructive pulmonary disease with acute lower respiratory infection: Secondary | ICD-10-CM | POA: Diagnosis present

## 2015-01-26 DIAGNOSIS — Y95 Nosocomial condition: Secondary | ICD-10-CM | POA: Diagnosis present

## 2015-01-26 LAB — URINALYSIS, ROUTINE W REFLEX MICROSCOPIC
Bilirubin Urine: NEGATIVE
Glucose, UA: NEGATIVE mg/dL
KETONES UR: NEGATIVE mg/dL
NITRITE: NEGATIVE
PROTEIN: NEGATIVE mg/dL
Specific Gravity, Urine: 1.01 (ref 1.005–1.030)
Urobilinogen, UA: 0.2 mg/dL (ref 0.0–1.0)
pH: 5 (ref 5.0–8.0)

## 2015-01-26 LAB — CBC
HCT: 38.3 % — ABNORMAL LOW (ref 39.0–52.0)
Hemoglobin: 12.2 g/dL — ABNORMAL LOW (ref 13.0–17.0)
MCH: 23.9 pg — ABNORMAL LOW (ref 26.0–34.0)
MCHC: 31.9 g/dL (ref 30.0–36.0)
MCV: 75 fL — AB (ref 78.0–100.0)
PLATELETS: 282 10*3/uL (ref 150–400)
RBC: 5.11 MIL/uL (ref 4.22–5.81)
RDW: 15.1 % (ref 11.5–15.5)
WBC: 14.7 10*3/uL — AB (ref 4.0–10.5)

## 2015-01-26 LAB — COMPREHENSIVE METABOLIC PANEL
ALT: 34 U/L (ref 0–53)
ANION GAP: 9 (ref 5–15)
AST: 36 U/L (ref 0–37)
Albumin: 3.7 g/dL (ref 3.5–5.2)
Alkaline Phosphatase: 60 U/L (ref 39–117)
BILIRUBIN TOTAL: 0.2 mg/dL — AB (ref 0.3–1.2)
BUN: 9 mg/dL (ref 6–23)
CO2: 24 mmol/L (ref 19–32)
Calcium: 9.1 mg/dL (ref 8.4–10.5)
Chloride: 105 mmol/L (ref 96–112)
Creatinine, Ser: 0.71 mg/dL (ref 0.50–1.35)
GLUCOSE: 115 mg/dL — AB (ref 70–99)
POTASSIUM: 3.7 mmol/L (ref 3.5–5.1)
Sodium: 138 mmol/L (ref 135–145)
TOTAL PROTEIN: 7.1 g/dL (ref 6.0–8.3)

## 2015-01-26 LAB — I-STAT TROPONIN, ED: TROPONIN I, POC: 0.01 ng/mL (ref 0.00–0.08)

## 2015-01-26 LAB — URINE MICROSCOPIC-ADD ON

## 2015-01-26 MED ORDER — DEXTROSE 5 % IV SOLN
1.0000 g | INTRAVENOUS | Status: DC
  Administered 2015-01-26: 1 g via INTRAVENOUS
  Filled 2015-01-26: qty 10

## 2015-01-26 MED ORDER — SODIUM CHLORIDE 0.9 % IV SOLN
INTRAVENOUS | Status: DC
  Administered 2015-01-26 – 2015-01-27 (×2): 20 mL/h via INTRAVENOUS

## 2015-01-26 MED ORDER — IPRATROPIUM BROMIDE 0.02 % IN SOLN: 0.5000 mg | Freq: Once | RESPIRATORY_TRACT | Status: DC

## 2015-01-26 MED ORDER — ALBUTEROL SULFATE (2.5 MG/3ML) 0.083% IN NEBU: 5.0000 mg | INHALATION_SOLUTION | Freq: Once | RESPIRATORY_TRACT | Status: DC

## 2015-01-26 MED ORDER — OXYCODONE-ACETAMINOPHEN 5-325 MG PO TABS
2.0000 | ORAL_TABLET | Freq: Once | ORAL | Status: AC
  Administered 2015-01-26: 2 via ORAL
  Filled 2015-01-26: qty 2

## 2015-01-26 MED ORDER — IOHEXOL 350 MG/ML SOLN
100.0000 mL | Freq: Once | INTRAVENOUS | Status: AC | PRN
  Administered 2015-01-26: 100 mL via INTRAVENOUS

## 2015-01-26 NOTE — ED Notes (Signed)
Pt c/o mid sternal chest pain and dizziness since yesterday. Pain is worse with deep inspiration. Pt went to PMD yesterday for same. States PMD told him that there was something wrong with his lungs. Pt also states he is nauseous. Skin warm, dry. Pt alert, no acute distress.

## 2015-01-26 NOTE — ED Provider Notes (Signed)
CSN: [id number]     Arrival date & time 01/26/15  1918 History   First MD Initiated Contact with Patient 01/26/15 2120     Chief Complaint  Patient presents with  . Chest Pain  . Dizziness     (Consider location/radiation/quality/duration/timing/severity/associated sxs/prior Treatment) HPI Comments: Patient here complaining of upper chest discomfort 24 hours as worse with coughing. No reported fever. Cough has been nonproductive. Patient recently hospitalized for prostatitis. Discharged 2 days ago. Denies any leg pain or swelling. Pain is nonpleuritic. Saw his physician today and diagnosed with infection and the patient had an antibiotic prescription called in which he did not fill. Symptoms better with rest.  Patient is a 64 y.o. male presenting with chest pain and dizziness. The history is provided by the patient.  Chest Pain Associated symptoms: dizziness   Dizziness Associated symptoms: chest pain     Past Medical History  Diagnosis Date  . Hypertension   . Diabetes mellitus   . Brain tumor   . CVA (cerebral vascular accident)   . Narcotic abuse   . CHF (congestive heart failure)   . TIA (transient ischemic attack) 09/06/2012  . Vertigo 09/06/2012  . HTN (hypertension), malignant 09/06/2012  . Tobacco use disorder 12/10/2013  . Alcohol dependence 12/10/2013  . 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 No pertinent past surgical history. Family History  Problem Relation Age of Onset  . CAD Father   . Diabetes Brother   . Diabetes Sister   . Hypertension Father   . Hypertension Mother    History  Substance Use Topics  . Smoking status: Current Every Day Smoker -- 0.50 packs/day    Types: Cigarettes  . Smokeless tobacco: Current User  . Alcohol Use: 0.0 oz/week    0 Not specified per week     Comment: 6 pack a week    Review of Systems  Cardiovascular: Positive for chest pain.  Neurological: Positive for dizziness.  All other systems reviewed and are  negative.     Allergies  Review of patient's allergies indicates no known allergies.  Home Medications   Prior to Admission medications   Medication Sig Start Date End Date Taking? Authorizing Provider  acetaminophen (TYLENOL) 500 MG tablet Take 500 mg by mouth every 6 (six) hours as needed for headache.    Historical Provider, MD  amLODipine (NORVASC) 10 MG tablet Take 10 mg by mouth daily.    Historical Provider, MD  ampicillin (PRINCIPEN) 250 MG capsule Take 1 capsule (250 mg total) by mouth every 6 (six) hours. 01/15/15   Venetia Maxon Rama, MD  aspirin EC 81 MG tablet Take 81 mg by mouth daily.    Historical Provider, MD  b complex vitamins tablet Take 1 tablet by mouth daily.    Historical Provider, MD  hydrALAZINE (APRESOLINE) 50 MG tablet Take 1 tablet (50 mg total) by mouth every 8 (eight) hours. 12/27/13   Debbe Odea, MD  isosorbide dinitrate (ISORDIL) 10 MG tablet Take 1 tablet (10 mg total) by mouth 3 (three) times daily. 12/27/13   Debbe Odea, MD  metFORMIN (GLUCOPHAGE) 500 MG tablet Take 500 mg by mouth 2 (two) times daily with a meal.    Historical Provider, MD  oxyCODONE-acetaminophen (PERCOCET) 5-325 MG per tablet Take 1 tablet by mouth every 4 (four) hours as needed for severe pain. 09/01/14   Tatyana A Kirichenko, PA-C  phenazopyridine (PYRIDIUM) 200 MG tablet Take 1 tablet (200 mg total) by mouth 3 (three) times  daily with meals. 01/15/15   Venetia Maxon Rama, MD  pravastatin (PRAVACHOL) 20 MG tablet Take 20 mg by mouth at bedtime.    Historical Provider, MD  tamsulosin (FLOMAX) 0.4 MG CAPS capsule Take 1 capsule (0.4 mg total) by mouth daily. 01/15/15   Venetia Maxon Rama, MD  traZODone (DESYREL) 100 MG tablet Take 1 tablet (100 mg total) by mouth at bedtime as needed for sleep. 01/15/15   Christina P Rama, MD   BP 179/70 mmHg  Pulse 73  Temp(Src) 98.9 F (37.2 C) (Oral)  Resp 17  SpO2 100% Physical Exam  Constitutional: He is oriented to person, place, and time. He  appears well-developed and well-nourished.  Non-toxic appearance. No distress.  HENT:  Head: Normocephalic and atraumatic.  Eyes: Conjunctivae, EOM and lids are normal. Pupils are equal, round, and reactive to light.  Neck: Normal range of motion. Neck supple. No tracheal deviation present. No thyroid mass present.  Cardiovascular: Normal rate, regular rhythm and normal heart sounds.  Exam reveals no gallop.   No murmur heard. Pulmonary/Chest: Effort normal. No stridor. No respiratory distress. He has decreased breath sounds. He has no wheezes. He has no rhonchi. He has no rales.  Abdominal: Soft. Normal appearance and bowel sounds are normal. He exhibits no distension. There is no tenderness. There is no rebound and no CVA tenderness.  Musculoskeletal: Normal range of motion. He exhibits no edema or tenderness.  Neurological: He is alert and oriented to person, place, and time. He has normal strength. No cranial nerve deficit or sensory deficit. GCS eye subscore is 4. GCS verbal subscore is 5. GCS motor subscore is 6.  Skin: Skin is warm and dry. No abrasion and no rash noted.  Psychiatric: He has a normal mood and affect. His speech is normal and behavior is normal.  Nursing note and vitals reviewed.   ED Course  Procedures (including critical care time) Labs Review Labs Reviewed  CBC - Abnormal; Notable for the following:    WBC 14.7 (*)    Hemoglobin 12.2 (*)    HCT 38.3 (*)    MCV 75.0 (*)    MCH 23.9 (*)    All other components within normal limits  COMPREHENSIVE METABOLIC PANEL - Abnormal; Notable for the following:    Glucose, Bld 115 (*)    Total Bilirubin 0.2 (*)    All other components within normal limits  URINE CULTURE  URINALYSIS, ROUTINE W REFLEX MICROSCOPIC  I-STAT TROPOININ, ED    Imaging Review No results found.   EKG Interpretation None  Date: 01/26/2015  Rate: 70  Rhythm: normal sinus rhythm  QRS Axis: normal  Intervals: normal  ST/T Wave  abnormalities: normal  Conduction Disutrbances:none  Narrative Interpretation:   Old EKG Reviewed: none available      MDM   Final diagnoses:  Chest pain  SOB (shortness of breath)    Pt to be admitted for pna    Leota Jacobsen, MD 01/28/15 1732

## 2015-01-27 ENCOUNTER — Encounter (HOSPITAL_COMMUNITY): Payer: Self-pay | Admitting: General Practice

## 2015-01-27 DIAGNOSIS — J440 Chronic obstructive pulmonary disease with acute lower respiratory infection: Secondary | ICD-10-CM | POA: Diagnosis present

## 2015-01-27 DIAGNOSIS — N390 Urinary tract infection, site not specified: Secondary | ICD-10-CM | POA: Diagnosis present

## 2015-01-27 DIAGNOSIS — D72829 Elevated white blood cell count, unspecified: Secondary | ICD-10-CM

## 2015-01-27 DIAGNOSIS — J189 Pneumonia, unspecified organism: Secondary | ICD-10-CM | POA: Diagnosis present

## 2015-01-27 DIAGNOSIS — E1165 Type 2 diabetes mellitus with hyperglycemia: Secondary | ICD-10-CM

## 2015-01-27 DIAGNOSIS — Z79899 Other long term (current) drug therapy: Secondary | ICD-10-CM | POA: Diagnosis not present

## 2015-01-27 DIAGNOSIS — I10 Essential (primary) hypertension: Secondary | ICD-10-CM

## 2015-01-27 DIAGNOSIS — R0602 Shortness of breath: Secondary | ICD-10-CM | POA: Diagnosis present

## 2015-01-27 DIAGNOSIS — Z8673 Personal history of transient ischemic attack (TIA), and cerebral infarction without residual deficits: Secondary | ICD-10-CM

## 2015-01-27 DIAGNOSIS — Y95 Nosocomial condition: Secondary | ICD-10-CM | POA: Diagnosis present

## 2015-01-27 DIAGNOSIS — R05 Cough: Secondary | ICD-10-CM | POA: Diagnosis present

## 2015-01-27 DIAGNOSIS — Z7982 Long term (current) use of aspirin: Secondary | ICD-10-CM | POA: Diagnosis not present

## 2015-01-27 DIAGNOSIS — B952 Enterococcus as the cause of diseases classified elsewhere: Secondary | ICD-10-CM | POA: Diagnosis present

## 2015-01-27 DIAGNOSIS — Z720 Tobacco use: Secondary | ICD-10-CM

## 2015-01-27 DIAGNOSIS — J441 Chronic obstructive pulmonary disease with (acute) exacerbation: Secondary | ICD-10-CM | POA: Diagnosis present

## 2015-01-27 DIAGNOSIS — D352 Benign neoplasm of pituitary gland: Secondary | ICD-10-CM | POA: Diagnosis present

## 2015-01-27 DIAGNOSIS — E785 Hyperlipidemia, unspecified: Secondary | ICD-10-CM | POA: Diagnosis present

## 2015-01-27 DIAGNOSIS — R059 Cough, unspecified: Secondary | ICD-10-CM | POA: Diagnosis present

## 2015-01-27 DIAGNOSIS — F17210 Nicotine dependence, cigarettes, uncomplicated: Secondary | ICD-10-CM | POA: Diagnosis present

## 2015-01-27 DIAGNOSIS — N419 Inflammatory disease of prostate, unspecified: Secondary | ICD-10-CM | POA: Diagnosis present

## 2015-01-27 DIAGNOSIS — J40 Bronchitis, not specified as acute or chronic: Secondary | ICD-10-CM

## 2015-01-27 LAB — GLUCOSE, CAPILLARY
Glucose-Capillary: 254 mg/dL — ABNORMAL HIGH (ref 70–99)
Glucose-Capillary: 290 mg/dL — ABNORMAL HIGH (ref 70–99)
Glucose-Capillary: 309 mg/dL — ABNORMAL HIGH (ref 70–99)
Glucose-Capillary: 293 mg/dL — ABNORMAL HIGH (ref 70–99)

## 2015-01-27 LAB — RAPID URINE DRUG SCREEN, HOSP PERFORMED
AMPHETAMINES: NOT DETECTED
BARBITURATES: NOT DETECTED
Benzodiazepines: NOT DETECTED
Cocaine: NOT DETECTED
OPIATES: POSITIVE — AB
Tetrahydrocannabinol: NOT DETECTED

## 2015-01-27 LAB — STREP PNEUMONIAE URINARY ANTIGEN: Strep Pneumo Urinary Antigen: NEGATIVE

## 2015-01-27 LAB — BRAIN NATRIURETIC PEPTIDE: B Natriuretic Peptide: 79.3 pg/mL (ref 0.0–100.0)

## 2015-01-27 MED ORDER — HYDRALAZINE HCL 20 MG/ML IJ SOLN
5.0000 mg | Freq: Four times a day (QID) | INTRAMUSCULAR | Status: DC | PRN
  Administered 2015-01-28: 5 mg via INTRAVENOUS
  Filled 2015-01-27: qty 1

## 2015-01-27 MED ORDER — PHENAZOPYRIDINE HCL 200 MG PO TABS
200.0000 mg | ORAL_TABLET | Freq: Three times a day (TID) | ORAL | Status: DC
  Administered 2015-01-27 – 2015-01-30 (×9): 200 mg via ORAL
  Filled 2015-01-27 (×14): qty 1

## 2015-01-27 MED ORDER — B COMPLEX PO TABS: 1.0000 | ORAL_TABLET | Freq: Every day | ORAL | Status: DC

## 2015-01-27 MED ORDER — HEPARIN SODIUM (PORCINE) 5000 UNIT/ML IJ SOLN
5000.0000 [IU] | Freq: Three times a day (TID) | INTRAMUSCULAR | Status: DC
  Administered 2015-01-27 – 2015-01-30 (×6): 5000 [IU] via SUBCUTANEOUS
  Filled 2015-01-27 (×13): qty 1

## 2015-01-27 MED ORDER — DM-GUAIFENESIN ER 30-600 MG PO TB12
1.0000 | ORAL_TABLET | Freq: Two times a day (BID) | ORAL | Status: DC
  Administered 2015-01-27 – 2015-01-30 (×7): 1 via ORAL
  Filled 2015-01-27 (×9): qty 1

## 2015-01-27 MED ORDER — ACETAMINOPHEN 500 MG PO TABS: 500.0000 mg | ORAL_TABLET | Freq: Four times a day (QID) | ORAL | Status: DC | PRN

## 2015-01-27 MED ORDER — NICOTINE 21 MG/24HR TD PT24
21.0000 mg | MEDICATED_PATCH | Freq: Every day | TRANSDERMAL | Status: DC
  Administered 2015-01-27 – 2015-01-30 (×4): 21 mg via TRANSDERMAL
  Filled 2015-01-27 (×4): qty 1

## 2015-01-27 MED ORDER — IPRATROPIUM-ALBUTEROL 0.5-2.5 (3) MG/3ML IN SOLN: 3.0000 mL | RESPIRATORY_TRACT | Status: DC

## 2015-01-27 MED ORDER — PRAVASTATIN SODIUM 20 MG PO TABS
20.0000 mg | ORAL_TABLET | Freq: Every day | ORAL | Status: DC
Start: 2015-01-27 — End: 2015-01-30
  Administered 2015-01-27 – 2015-01-29 (×3): 20 mg via ORAL
  Filled 2015-01-27 (×5): qty 1

## 2015-01-27 MED ORDER — METHYLPREDNISOLONE SODIUM SUCC 40 MG IJ SOLR
40.0000 mg | Freq: Every day | INTRAMUSCULAR | Status: DC
  Administered 2015-01-28: 40 mg via INTRAVENOUS
  Filled 2015-01-27: qty 1

## 2015-01-27 MED ORDER — OXYCODONE-ACETAMINOPHEN 5-325 MG PO TABS
1.0000 | ORAL_TABLET | ORAL | Status: DC | PRN
  Administered 2015-01-27 (×3): 1 via ORAL
  Filled 2015-01-27 (×3): qty 1

## 2015-01-27 MED ORDER — ISOSORBIDE DINITRATE 10 MG PO TABS
10.0000 mg | ORAL_TABLET | Freq: Three times a day (TID) | ORAL | Status: DC
  Administered 2015-01-27 – 2015-01-30 (×8): 10 mg via ORAL
  Filled 2015-01-27 (×12): qty 1

## 2015-01-27 MED ORDER — ALBUTEROL SULFATE (2.5 MG/3ML) 0.083% IN NEBU: 2.5000 mg | INHALATION_SOLUTION | RESPIRATORY_TRACT | Status: DC | PRN

## 2015-01-27 MED ORDER — IPRATROPIUM-ALBUTEROL 0.5-2.5 (3) MG/3ML IN SOLN
3.0000 mL | Freq: Two times a day (BID) | RESPIRATORY_TRACT | Status: DC
  Administered 2015-01-27 – 2015-01-28 (×3): 3 mL via RESPIRATORY_TRACT
  Filled 2015-01-27 (×6): qty 3

## 2015-01-27 MED ORDER — ASPIRIN EC 81 MG PO TBEC
81.0000 mg | DELAYED_RELEASE_TABLET | Freq: Every day | ORAL | Status: DC
  Administered 2015-01-27 – 2015-01-30 (×4): 81 mg via ORAL
  Filled 2015-01-27 (×4): qty 1

## 2015-01-27 MED ORDER — B COMPLEX-C PO TABS
1.0000 | ORAL_TABLET | Freq: Every day | ORAL | Status: DC
  Administered 2015-01-27 – 2015-01-30 (×4): 1 via ORAL
  Filled 2015-01-27 (×4): qty 1

## 2015-01-27 MED ORDER — HYDRALAZINE HCL 50 MG PO TABS
50.0000 mg | ORAL_TABLET | Freq: Three times a day (TID) | ORAL | Status: DC
  Administered 2015-01-27 – 2015-01-29 (×6): 50 mg via ORAL
  Filled 2015-01-27 (×11): qty 1

## 2015-01-27 MED ORDER — TRAZODONE HCL 50 MG PO TABS
100.0000 mg | ORAL_TABLET | Freq: Every evening | ORAL | Status: DC | PRN
  Administered 2015-01-27 (×2): 100 mg via ORAL
  Filled 2015-01-27 (×2): qty 2

## 2015-01-27 MED ORDER — ACETAMINOPHEN 160 MG/5ML PO SOLN
650.0000 mg | Freq: Four times a day (QID) | ORAL | Status: DC | PRN
Start: 2015-01-27 — End: 2015-01-30

## 2015-01-27 MED ORDER — INSULIN ASPART 100 UNIT/ML ~~LOC~~ SOLN
0.0000 [IU] | Freq: Three times a day (TID) | SUBCUTANEOUS | Status: DC
  Administered 2015-01-27: 7 [IU] via SUBCUTANEOUS
  Administered 2015-01-27 (×2): 5 [IU] via SUBCUTANEOUS
  Administered 2015-01-28: 3 [IU] via SUBCUTANEOUS
  Administered 2015-01-28: 2 [IU] via SUBCUTANEOUS
  Administered 2015-01-29 (×3): 1 [IU] via SUBCUTANEOUS

## 2015-01-27 MED ORDER — TAMSULOSIN HCL 0.4 MG PO CAPS
0.4000 mg | ORAL_CAPSULE | Freq: Every day | ORAL | Status: DC
  Administered 2015-01-27 – 2015-01-30 (×4): 0.4 mg via ORAL
  Filled 2015-01-27 (×4): qty 1

## 2015-01-27 MED ORDER — AMLODIPINE BESYLATE 10 MG PO TABS
10.0000 mg | ORAL_TABLET | Freq: Every day | ORAL | Status: DC
  Administered 2015-01-27 – 2015-01-30 (×4): 10 mg via ORAL
  Filled 2015-01-27 (×4): qty 1

## 2015-01-27 MED ORDER — METHYLPREDNISOLONE SODIUM SUCC 125 MG IJ SOLR
60.0000 mg | Freq: Two times a day (BID) | INTRAMUSCULAR | Status: DC
  Administered 2015-01-27 (×2): 60 mg via INTRAVENOUS
  Filled 2015-01-27 (×3): qty 0.96

## 2015-01-27 MED ORDER — LEVOFLOXACIN IN D5W 500 MG/100ML IV SOLN
500.0000 mg | INTRAVENOUS | Status: DC
  Administered 2015-01-27 – 2015-01-28 (×2): 500 mg via INTRAVENOUS
  Filled 2015-01-27 (×3): qty 100

## 2015-01-27 NOTE — H&P (Signed)
[facility] History and Physical  [name] ZOX:[id number] DOB: Feb 10, 1951 DOA: 01/26/2015  Referring physician: ED physician PCP: Philis Fendt, MD  Specialists:   Chief Complaint: cough, sob and dysuria  HPI: [name] is a 64 y.o. male with past medical history of hypertension, hyperlipidemia, diabetes, TIA, brain tumor, tobacco abuse, recently diagnosed prostatitis, who presents with cough, shortness breath, dysuria.  Patient reports that in the past 2 days, he has been having cough, shortness of breath, and a mild chest pain which is related to coughing. He does not have fever, but has chills. He coughs up yellow colored sputum. He was given prescription of antibiotics by his primary care doctor on 01/25/14, but he has not picked up the medication yet.  He also reports having dysuria and burning recently. Of note, patient was recently hospitalized from 1/5 to 01/05/15 for possible pyelonephritis versus prostatitis. He was initially treated with Rocephin, and narrowed to and discharged on oral ampicillin at 01/05/15. Urine cultures grew group B strep. Patient states that he has been compliant to his antibiotics. Patient denies fever, abdominal pain, diarrhea, constipation leg swelling.  Work up in the ED demonstrates leukocytosis with WBC 14.7. Posterior urinalysis for large amount of leukocytes, negative troponin. CT angiogram of chest is negative for PE, no infiltration. Patient is admitted to inpatient for further evaluation and treatment.  Review of Systems: As presented in the history of presenting illness, rest negative.  Where does patient live?  At home Can patient participate in ADLs? Yes  Allergy: No Known Allergies  Past Medical History  Diagnosis Date  . Hypertension   . Diabetes mellitus   . Brain tumor   . CVA (cerebral vascular accident)   . Narcotic abuse   . CHF (congestive heart failure)   . TIA (transient ischemic attack) 09/06/2012  . Vertigo  09/06/2012  . HTN (hypertension), malignant 09/06/2012  . Tobacco use disorder 12/10/2013  . Alcohol dependence 12/10/2013  . Angioedema of lips 12/24/2013    History reviewed. No pertinent past surgical history.  Social History:  reports that he has been smoking Cigarettes.  He has been smoking about 0.50 packs per day. He uses smokeless tobacco. He reports that he drinks alcohol. He reports that he uses illicit drugs.  Family History:  Family History  Problem Relation Age of Onset  . CAD Father   . Diabetes Brother   . Diabetes Sister   . Hypertension Father   . Hypertension Mother      Prior to Admission medications   Medication Sig Start Date End Date Taking? Authorizing Provider  acetaminophen (TYLENOL) 500 MG tablet Take 500 mg by mouth every 6 (six) hours as needed for headache.    Historical Provider, MD  amLODipine (NORVASC) 10 MG tablet Take 10 mg by mouth daily.    Historical Provider, MD  ampicillin (PRINCIPEN) 250 MG capsule Take 1 capsule (250 mg total) by mouth every 6 (six) hours. 01/15/15   Venetia Maxon Rama, MD  aspirin EC 81 MG tablet Take 81 mg by mouth daily.    Historical Provider, MD  b complex vitamins tablet Take 1 tablet by mouth daily.    Historical Provider, MD  hydrALAZINE (APRESOLINE) 50 MG tablet Take 1 tablet (50 mg total) by mouth every 8 (eight) hours. 12/27/13   Debbe Odea, MD  isosorbide dinitrate (ISORDIL) 10 MG tablet Take 1 tablet (10 mg total) by mouth 3 (three) times daily. 12/27/13   Debbe Odea, MD  metFORMIN (  GLUCOPHAGE) 500 MG tablet Take 500 mg by mouth 2 (two) times daily with a meal.    Historical Provider, MD  oxyCODONE-acetaminophen (PERCOCET) 5-325 MG per tablet Take 1 tablet by mouth every 4 (four) hours as needed for severe pain. 09/01/14   Tatyana A Kirichenko, PA-C  phenazopyridine (PYRIDIUM) 200 MG tablet Take 1 tablet (200 mg total) by mouth 3 (three) times daily with meals. 01/15/15   Venetia Maxon Rama, MD  pravastatin (PRAVACHOL) 20  MG tablet Take 20 mg by mouth at bedtime.    Historical Provider, MD  tamsulosin (FLOMAX) 0.4 MG CAPS capsule Take 1 capsule (0.4 mg total) by mouth daily. 01/15/15   Venetia Maxon Rama, MD  traZODone (DESYREL) 100 MG tablet Take 1 tablet (100 mg total) by mouth at bedtime as needed for sleep. 01/15/15   Venetia Maxon Rama, MD    Physical Exam: Filed Vitals:   01/26/15 2300 01/26/15 2330 01/27/15 0015 01/27/15 0053  BP: 166/78 198/80  155/78  Pulse:  70 57 1  Temp:    98.4 F (36.9 C)  TempSrc:    Oral  Resp: 9 15 7 18   Height:    5\' 9"  (1.753 m)  Weight:    79.5 kg (175 lb 4.3 oz)  SpO2:  95% 100% 99%   General: Not in acute distress HEENT:       Eyes: PERRL, EOMI, no scleral icterus       ENT: No discharge from the ears and nose, no pharynx injection, no tonsillar enlargement.        Neck: No JVD, no bruit, no mass felt. Cardiac: S1/S2, RRR, No murmurs, No gallops or rubs Pulm: diffused rhonchi and mild wheezing, no rales or rubs. Abd: Soft, nondistended, nontender, no rebound pain, no organomegaly, BS present Ext: No edema bilaterally. 2+DP/PT pulse bilaterally Musculoskeletal: No joint deformities, erythema, or stiffness, ROM full Skin: No rashes.  Neuro: Alert and oriented X3, cranial nerves II-XII grossly intact, muscle strength 5/5 in all extremeties, sensation to light touch intact. Brachial reflex 2+ bilaterally. Knee reflex 1+ bilaterally. Negative Babinski's sign. Normal finger to nose test. Psych: Patient is not psychotic, no suicidal or hemocidal ideation.  Labs on Admission:  Basic Metabolic Panel:  Recent Labs Lab 01/26/15 1949  NA 138  K 3.7  CL 105  CO2 24  GLUCOSE 115*  BUN 9  CREATININE 0.71  CALCIUM 9.1   Liver Function Tests:  Recent Labs Lab 01/26/15 1949  AST 36  ALT 34  ALKPHOS 60  BILITOT 0.2*  PROT 7.1  ALBUMIN 3.7   No results for input(s): LIPASE, AMYLASE in the last 168 hours. No results for input(s): AMMONIA in the last 168  hours. CBC:  Recent Labs Lab 01/26/15 1949  WBC 14.7*  HGB 12.2*  HCT 38.3*  MCV 75.0*  PLT 282   Cardiac Enzymes: No results for input(s): CKTOTAL, CKMB, CKMBINDEX, TROPONINI in the last 168 hours.  BNP (last 3 results)  Recent Labs  10/15/14 0721 11/03/14 0042  PROBNP 73.0 1363.0*   CBG: No results for input(s): GLUCAP in the last 168 hours.  Radiological Exams on Admission: Ct Angio Chest Pe W/cm &/or Wo Cm  01/26/2015   CLINICAL DATA:  Midsternal chest pain and lightheadedness since yesterday. Pleuritic pain.  EXAM: CT ANGIOGRAPHY CHEST WITH CONTRAST  TECHNIQUE: Multidetector CT imaging of the chest was performed using the standard protocol during bolus administration of intravenous contrast. Multiplanar CT image reconstructions and MIPs were obtained to  evaluate the vascular anatomy.  CONTRAST:  167mL OMNIPAQUE IOHEXOL 350 MG/ML SOLN  COMPARISON:  08/15/2014  FINDINGS: Cardiovascular: There is good opacification of the pulmonary vasculature. There is no pulmonary embolism. The thoracic aorta is normal in caliber and intact.  Lungs: Mild emphysematous blebs are present in the upper lobe periphery bilaterally. Dependent posterior base opacities are present in both lungs, likely a combination of mild atelectasis and mild fibrotic changes.  Central airways: Patent  Effusions: None  Lymphadenopathy: None  Esophagus: Unremarkable  Upper abdomen: No acute findings  Musculoskeletal: No significant abnormality  Review of the MIP images confirms the above findings.  IMPRESSION: Negative for pulmonary embolism. Mild upper lobe emphysematous blebs are present.   Electronically Signed   By: Andreas Newport M.D.   On: 01/26/2015 23:34    EKG: Independently reviewed. U wave in V1-V2.  Assessment/Plan Principal Problem:   UTI (lower urinary tract infection) Active Problems:   Diabetes mellitus type 2, uncontrolled   Tobacco use disorder   Essential hypertension   History of TIA  (transient ischemic attack)   Cough  Cough and SOB: It is most likely caused by COPD exacerbation. Patient may have undiagnosed COPD given his long history of smoking. Chest CT angiogram did not show infiltration and PE. Patient has diffused rhonchi on lung auscultation, which is consistent with COPD exacerbation.  -will admit patient to telemetry bed  -Nebulizers: scheduled Duoneb and prn albuterol -Solu-Medrol 60 mg IV bid -levaquin -Mucinex for cough  -Blood and sputum culture, respiratory viral panel  UTI: Urinalysis is positive for large amount of leukocytes. Patient could also have prostatitis -on Levaquin -Follow-up urine culture -GC/Chlamydia prob -HIV ab  Diabetes mellitus type 2: Hemoglobin A1c 6.7% 08/16/14. Patient is on metformin at home. -Switched to SSI  Essential hypertension - Norvasc and hydralazine and Isordil.  Tobacco abuse: -Did counseling about importance of quitting smoking  -Nicotine patch  History of TIA: No new issues. -Continue pravastatin and aspirin  DVT ppx: SQ Heparin        Code Status: Full code  Family Communication: None at bed side.       Disposition Plan: Admit to inpatient   Date of Service 01/27/2015    Ivor Costa [facility] Pager [phone]  If 7PM-7AM, please contact night-coverage [email or web address] Password [facility] 01/27/2015, 4:34 AM

## 2015-01-27 NOTE — Progress Notes (Signed)
Patient ID: [name], male   DOB: Nov 26, 1951, 64 y.o.   MRN: [id number] [facility] PROGRESS NOTE  [name] FUX:[id number] DOB: [birth year] DOA: 01/26/2015 PCP: Philis Fendt, MD  Brief narrative:   Addendum to admission note done 01/27/2015. 64 year old male with past medical history of diabetes, hypertension, polysubstance abuse, pituitary adenoma, recent hospitalization for pyelonephritis (urine culture grew group B strep) who presented to [facility] ED with shortness of breath and cough productive of yellow sputum for past 2 days prior to this admission. No fevers.  On admission, patient was hemodynamically stable. CT angiogram was negative for pulmonary embolism. Blood work showed leukocytosis of 14.7, UA showed large leukocytes and many bacteria. Pt was started on Levaquin for possible pneumonia. Abx would cover for UTI as well.    Assessment/Plan:    Principal Problem: Healthcare associated pneumonia / Leukocytosis  - initial concern for possible pneumonia since pt presented with cough adn shortness of breath - started on Levaquin  - follow up legionella, blood culture results, HIV and influenza results - strep pneumonia negative - respiratory status stable  Active Problems: COPD exacerbation - respiratory status stable - continue duoneb every 12 hours scheduled and albuterol every 2 hours as needed for shortness of breath or wheezing  - may continue solumedrol but taper down from twice a day to once a day regimen - oxygen support via [place] to keep O2 sats above 90%  UTI - UA on admission showed large leukocytes and many bacteria - follow up urine culture results - continue Levaquin for now   Diabetes mellitus type 2, controlled - Hemoglobin A1c 6.7% 08/16/14 indicating good glycemic control  - continue SSI  Pituitary adenoma - Stable.  Essential hypertension - Controlled on Norvasc, Hydralazine and Isordil.Marland Kitchen  Dyslipidemia - continue pravastatin at bedtime    DVT Prophylaxis  - heparin subQ ordered    Code Status: Full.  Family Communication:  Family not at the bedside this am  Disposition Plan: remains inpatient   IV access:  Peripheral IV  Procedures and diagnostic studies:    Ct Angio Chest Pe W/cm &/or Wo Cm 01/26/2015   Negative for pulmonary embolism. Mild upper lobe emphysematous blebs are present.     Medical Consultants:  None   Other Consultants:  Physical therapy   IAnti-Infectives:   Levaquin 01/27/2015 -->   Leisa Lenz, MD  [facility] Pager [phone]  If 7PM-7AM, please contact night-coverage [email or web address] Password [email or web address] 01/27/2015, 1:33 PM   LOS: 1 day    HPI/Subjective: No acute overnight events.  Objective: Filed Vitals:   01/27/15 0015 01/27/15 0053 01/27/15 0536 01/27/15 0802  BP:  155/78 167/69   Pulse: 57 1 64   Temp:  98.4 F (36.9 C) 98.7 F (37.1 C)   TempSrc:  Oral Oral   Resp: 7 18 16    Height:  5\' 9"  (1.753 m)    Weight:  79.5 kg (175 lb 4.3 oz)    SpO2: 100% 99% 97% 100%    Intake/Output Summary (Last 24 hours) at 01/27/15 1333 Last data filed at 01/27/15 1304  Gross per 24 hour  Intake 631.33 ml  Output   1400 ml  Net -768.67 ml    Exam:   General:  Pt is alert, follows commands appropriately, not in acute distress  Cardiovascular: Regular rate and rhythm, S1/S2 (+)  Respiratory: mild wheezing in upper lung lobes, no crackles   Abdomen: Soft, non tender, non distended, bowel sounds present  Extremities:  pulses DP and PT palpable bilaterally  Neuro: Grossly nonfocal  Data Reviewed: Basic Metabolic Panel:  Recent Labs Lab 01/26/15 1949  NA 138  K 3.7  CL 105  CO2 24  GLUCOSE 115*  BUN 9  CREATININE 0.71  CALCIUM 9.1   Liver Function Tests:  Recent Labs Lab 01/26/15 1949  AST 36  ALT 34  ALKPHOS 60  BILITOT 0.2*  PROT 7.1  ALBUMIN 3.7   No results for input(s): LIPASE, AMYLASE in the last 168 hours. No results for input(s): AMMONIA in  the last 168 hours. CBC:  Recent Labs Lab 01/26/15 1949  WBC 14.7*  HGB 12.2*  HCT 38.3*  MCV 75.0*  PLT 282   Cardiac Enzymes: No results for input(s): CKTOTAL, CKMB, CKMBINDEX, TROPONINI in the last 168 hours. BNP: Invalid input(s): POCBNP CBG:  Recent Labs Lab 01/27/15 0730 01/27/15 1157  GLUCAP 254* 290*    No results found for this or any previous visit (from the past 240 hour(s)).   Scheduled Meds: . amLODipine  10 mg Oral Daily  . aspirin EC  81 mg Oral Daily  . B-complex with vitamin  1 tablet Oral Daily  . dextromethorphan-guai  1 tablet Oral BID  . heparin  5,000 Units Subcutaneous 3 times per day  . hydrALAZINE  50 mg Oral 3 times per day  . insulin aspart  0-9 Units Subcutaneous TID WC  . ipratropium-albuterol  3 mL Nebulization BID  . isosorbide dinitrate  10 mg Oral TID  . levofloxacin   500 mg Intravenous Q24H  . methylPREDNISolone   60 mg Intravenous Q12H  . nicotine  21 mg Transdermal Daily  . phenazopyridine  200 mg Oral TID WC  . pravastatin  20 mg Oral QHS  . tamsulosin  0.4 mg Oral Daily   Continuous Infusions: . sodium chloride 20 mL/hr (01/27/15 0129)

## 2015-01-28 LAB — GLUCOSE, CAPILLARY
GLUCOSE-CAPILLARY: 185 mg/dL — AB (ref 70–99)
GLUCOSE-CAPILLARY: 231 mg/dL — AB (ref 70–99)
Glucose-Capillary: 119 mg/dL — ABNORMAL HIGH (ref 70–99)

## 2015-01-28 MED ORDER — MORPHINE SULFATE 2 MG/ML IJ SOLN
1.0000 mg | INTRAMUSCULAR | Status: DC | PRN
  Administered 2015-01-28 (×2): 4 mg via INTRAVENOUS
  Administered 2015-01-28: 2 mg via INTRAVENOUS
  Administered 2015-01-29 (×2): 4 mg via INTRAVENOUS
  Filled 2015-01-28: qty 1
  Filled 2015-01-28 (×4): qty 2

## 2015-01-28 MED ORDER — PROMETHAZINE HCL 25 MG/ML IJ SOLN
12.5000 mg | Freq: Four times a day (QID) | INTRAMUSCULAR | Status: DC | PRN
  Administered 2015-01-28: 25 mg via INTRAVENOUS
  Administered 2015-01-28: 12.5 mg via INTRAVENOUS
  Filled 2015-01-28 (×2): qty 1

## 2015-01-28 MED ORDER — ONDANSETRON HCL 4 MG/2ML IJ SOLN
4.0000 mg | Freq: Four times a day (QID) | INTRAMUSCULAR | Status: DC | PRN
  Administered 2015-01-28 – 2015-01-29 (×4): 4 mg via INTRAVENOUS
  Filled 2015-01-28 (×4): qty 2

## 2015-01-28 MED ORDER — SODIUM CHLORIDE 0.9 % IV SOLN
3.0000 g | Freq: Four times a day (QID) | INTRAVENOUS | Status: DC
  Administered 2015-01-28 – 2015-01-30 (×7): 3 g via INTRAVENOUS
  Filled 2015-01-28 (×8): qty 3

## 2015-01-28 NOTE — Progress Notes (Signed)
PT Cancellation Note  Patient Details Name: [name] MRN: [id number] DOB: [birth year]   Cancelled Treatment:    Reason Eval/Treat Not Completed: Medical issues which prohibited therapy.  Pt has been vomiting all AM per nursing.  She was giving him IV meds and requested to hold off til he feels better and pt in agreement.  Will check back tomorrow.   [name] 01/28/2015, 1:57 PM

## 2015-01-28 NOTE — Progress Notes (Signed)
Patient ID: [name], [name]   DOB: June 26, 1951, 64 y.o.   MRN: [id number]   [facility] PROGRESS NOTE  CARMEN VALLECILLO [id number] DOB: 1951/09/18 DOA: 01/26/2015 PCP: Philis Fendt, MD  Brief narrative:   64 year old [name] with past medical history of diabetes, hypertension, polysubstance abuse, pituitary adenoma, recent hospitalization for pyelonephritis (urine culture grew group B strep) who presented to [facility] ED with shortness of breath and cough productive of yellow sputum for 2 days prior to this admission. No fevers.  On admission patient was hemodynamically stable. CT angiogram was negative for pulmonary embolism or infiltrate. Blood work showed leukocytosis of 14.7, UA showed large leukocytes and many bacteria. Pt was started on Levaquin.  Assessment/Plan:    Enterococcus UTI / Pyelo - UA on admission showed large leukocytes and many bacteria - urine culture now revealing enterococcus  - change abx to unasyn and follow - with this being his second major UTI in a short time he may need repeat imaging to r/o abscess if he does not rapidly improve   COPD exacerbation - respiratory status stable - continue duoneb every 12 hours scheduled and albuterol every 2 hours as needed for shortness of breath or wheezing  - much improved w/ no wheezing on exam today - rapidly taper steroids  - oxygen support via [place] to keep O2 sats above 90%  Diabetes mellitus type 2, controlled - Hemoglobin A1c 6.7 08/16/14 indicating good glycemic control  - continue SSI  Pituitary adenoma - Stable  Essential hypertension - uncontrolled in setting of nausea and vomiting - follow w/o change today   Dyslipidemia - continue pravastatin at bedtime   DVT Prophylaxis  - heparin subQ   Code Status: Full.  Family Communication:  Family not at the bedside Disposition Plan: remains inpatient   Procedures and diagnostic studies:    Ct Angio Chest Pe W/cm &/or Wo Cm 01/26/2015   Negative for  pulmonary embolism. Mild upper lobe emphysematous blebs are present.     Medical Consultants:  None   Anti-Infectives:   Levaquin 01/27/2015 > 1/31 Unasyn 1/31 >  Cherene Altes, MD [facility] For Consults/Admissions - Flow Manager - [phone] Office  [phone]  Contact MD directly via text page:      [email or web address]      password [email or web address]  01/28/2015, 4:17 PM   LOS: 2 days    HPI/Subjective: Pt has developed severe nausea w/ vomiting today.  Reports ongoing abdom pain.  Objective: Filed Vitals:   01/27/15 2259 01/28/15 0608 01/28/15 0838 01/28/15 1300  BP: 156/61 144/66  180/69  Pulse: 83 74  81  Temp:  98.7 F (37.1 C)  98.9 F (37.2 C)  TempSrc:  Oral  Oral  Resp:  18  16  Height:      Weight:      SpO2:  96% 97% 98%    Intake/Output Summary (Last 24 hours) at 01/28/15 1617 Last data filed at 01/28/15 1542  Gross per 24 hour  Intake    480 ml  Output   2600 ml  Net  -2120 ml    Exam: General: No acute respiratory distress Lungs: Clear to auscultation bilaterally without wheezes or crackles Cardiovascular: Regular rate and rhythm without murmur gallop or rub normal S1 and S2 Abdomen: tender to deep palpation diffusely and in suprapubic region, soft, bowel sounds positive, no rebound, no ascites, no appreciable mass Extremities: No significant cyanosis, clubbing, or edema bilateral lower extremities   Data Reviewed:  Basic Metabolic Panel:  Recent Labs Lab 01/26/15 1949  NA 138  K 3.7  CL 105  CO2 24  GLUCOSE 115*  BUN 9  CREATININE 0.71  CALCIUM 9.1   Liver Function Tests:  Recent Labs Lab 01/26/15 1949  AST 36  ALT 34  ALKPHOS 60  BILITOT 0.2*  PROT 7.1  ALBUMIN 3.7   No results for input(s): LIPASE, AMYLASE in the last 168 hours. No results for input(s): AMMONIA in the last 168 hours. CBC:  Recent Labs Lab 01/26/15 1949  WBC 14.7*  HGB 12.2*  HCT 38.3*  MCV 75.0*  PLT 282   CBG:  Recent Labs Lab  01/27/15 1157 01/27/15 1628 01/27/15 2105 01/28/15 0736 01/28/15 1120  GLUCAP 290* 309* 293* 185* 231*    Recent Results (from the past 240 hour(s))  Urine culture     Status: None (Preliminary result)   Collection Time: 01/26/15  9:52 PM  Result Value Ref Range Status   Specimen Description URINE, CLEAN CATCH  Final   Special Requests NONE  Final   Colony Count   Final    >=100,000 COLONIES/ML Performed at [facility]    Culture   Final    ENTEROCOCCUS SPECIES Performed at [facility]    Report Status PENDING  Incomplete  Culture, blood (routine x 2)     Status: None (Preliminary result)   Collection Time: 01/27/15  2:15 AM  Result Value Ref Range Status   Specimen Description BLOOD RIGHT ARM  Final   Special Requests BOTTLES DRAWN AEROBIC ONLY 10CC  Final   Culture   Final           BLOOD CULTURE RECEIVED NO GROWTH TO DATE CULTURE WILL BE HELD FOR 5 DAYS BEFORE ISSUING A FINAL NEGATIVE REPORT Performed at [facility]    Report Status PENDING  Incomplete  Culture, blood (routine x 2)     Status: None (Preliminary result)   Collection Time: 01/27/15  2:20 AM  Result Value Ref Range Status   Specimen Description BLOOD RIGHT HAND  Final   Special Requests BOTTLES DRAWN AEROBIC ONLY 10CC  Final   Culture   Final           BLOOD CULTURE RECEIVED NO GROWTH TO DATE CULTURE WILL BE HELD FOR 5 DAYS BEFORE ISSUING A FINAL NEGATIVE REPORT Performed at [facility]    Report Status PENDING  Incomplete     Scheduled Meds: . amLODipine  10 mg Oral Daily  . aspirin EC  81 mg Oral Daily  . B-complex with vitamin  1 tablet Oral Daily  . dextromethorphan-guai  1 tablet Oral BID  . heparin  5,000 Units Subcutaneous 3 times per day  . hydrALAZINE  50 mg Oral 3 times per day  . insulin aspart  0-9 Units Subcutaneous TID WC  . ipratropium-albuterol  3 mL Nebulization BID  . isosorbide dinitrate  10 mg Oral TID  . levofloxacin   500 mg Intravenous  Q24H  . methylPREDNISolone   60 mg Intravenous Q12H  . nicotine  21 mg Transdermal Daily  . phenazopyridine  200 mg Oral TID WC  . pravastatin  20 mg Oral QHS  . tamsulosin  0.4 mg Oral Daily

## 2015-01-29 ENCOUNTER — Encounter (HOSPITAL_COMMUNITY): Payer: Self-pay | Admitting: Radiology

## 2015-01-29 ENCOUNTER — Inpatient Hospital Stay (HOSPITAL_COMMUNITY): Payer: Medicaid Other

## 2015-01-29 LAB — COMPREHENSIVE METABOLIC PANEL
ALT: 39 U/L (ref 0–53)
ANION GAP: 9 (ref 5–15)
AST: 53 U/L — ABNORMAL HIGH (ref 0–37)
Albumin: 3.4 g/dL — ABNORMAL LOW (ref 3.5–5.2)
Alkaline Phosphatase: 48 U/L (ref 39–117)
BUN: 15 mg/dL (ref 6–23)
CO2: 24 mmol/L (ref 19–32)
Calcium: 9.1 mg/dL (ref 8.4–10.5)
Chloride: 108 mmol/L (ref 96–112)
Creatinine, Ser: 0.82 mg/dL (ref 0.50–1.35)
GFR calc Af Amer: >90 mL/min (ref >90)
GFR calc non Af Amer: >90 mL/min (ref >90)
Glucose, Bld: 171 mg/dL — ABNORMAL HIGH (ref 70–99)
Potassium: 3.4 mmol/L — ABNORMAL LOW (ref 3.5–5.1)
SODIUM: 141 mmol/L (ref 135–145)
TOTAL PROTEIN: 6.8 g/dL (ref 6.0–8.3)
Total Bilirubin: 0.8 mg/dL (ref 0.3–1.2)

## 2015-01-29 LAB — GLUCOSE, CAPILLARY
GLUCOSE-CAPILLARY: 224 mg/dL — AB (ref 70–99)
GLUCOSE-CAPILLARY: 133 mg/dL — AB (ref 70–99)
GLUCOSE-CAPILLARY: 139 mg/dL — AB (ref 70–99)
GLUCOSE-CAPILLARY: 144 mg/dL — AB (ref 70–99)
Glucose-Capillary: 195 mg/dL — ABNORMAL HIGH (ref 70–99)

## 2015-01-29 LAB — LEGIONELLA ANTIGEN, URINE

## 2015-01-29 LAB — URINE CULTURE

## 2015-01-29 LAB — LIPASE, BLOOD: LIPASE: 34 U/L (ref 11–59)

## 2015-01-29 LAB — CBC
HCT: 37.9 % — ABNORMAL LOW (ref 39.0–52.0)
Hemoglobin: 12 g/dL — ABNORMAL LOW (ref 13.0–17.0)
MCH: 23.2 pg — AB (ref 26.0–34.0)
MCHC: 31.7 g/dL (ref 30.0–36.0)
MCV: 73.3 fL — ABNORMAL LOW (ref 78.0–100.0)
PLATELETS: 273 10*3/uL (ref 150–400)
RBC: 5.17 MIL/uL (ref 4.22–5.81)
RDW: 15.1 % (ref 11.5–15.5)
WBC: 17.7 10*3/uL — ABNORMAL HIGH (ref 4.0–10.5)

## 2015-01-29 MED ORDER — IOHEXOL 300 MG/ML  SOLN
25.0000 mL | INTRAMUSCULAR | Status: AC
  Administered 2015-01-29 (×2): 25 mL via ORAL

## 2015-01-29 MED ORDER — IOHEXOL 300 MG/ML  SOLN
100.0000 mL | Freq: Once | INTRAMUSCULAR | Status: AC | PRN
  Administered 2015-01-29: 100 mL via INTRAVENOUS

## 2015-01-29 MED ORDER — HYDRALAZINE HCL 50 MG PO TABS
100.0000 mg | ORAL_TABLET | Freq: Three times a day (TID) | ORAL | Status: DC
  Administered 2015-01-29 – 2015-01-30 (×3): 100 mg via ORAL
  Filled 2015-01-29 (×6): qty 2

## 2015-01-29 NOTE — Progress Notes (Signed)
OT Cancellation Note  Patient Details Name: [name] MRN: [id number] DOB: August 28, 1951   Cancelled Treatment:    Reason Eval/Treat Not Completed: Patient at procedure or test/ unavailable Pt going for test. OT will recheck on pt later in day or next day. Thanks,  Payton Mccallum D 01/29/2015, 1:12 PM

## 2015-01-29 NOTE — Progress Notes (Addendum)
Patient ID: [name], male   DOB: 04/04/1951, 64 y.o.   MRN: [id number]   [facility] PROGRESS NOTE  RODRIGUS KILKER TDD:[id number] DOB: Jan 05, 1951 DOA: 01/26/2015 PCP: Philis Fendt, MD  Brief narrative:   64 year old male with past medical history of diabetes, hypertension, polysubstance abuse, pituitary adenoma, recent hospitalization for pyelonephritis (urine culture grew group B strep) who presented to [facility] ED with shortness of breath and cough productive of yellow sputum for 2 days prior to this admission. No fevers.  On admission patient was hemodynamically stable. CT angiogram was negative for pulmonary embolism or infiltrate. Blood work showed leukocytosis of 14.7, UA showed large leukocytes and many bacteria. Pt was started on Levaquin.  Assessment/Plan:    Enterococcus UTI / Pyelo - UA on admission showed large leukocytes and many bacteria - urine culture now revealing enterococcus  Continue unasyn -day 2  with this being his second major UTI in a short time obtain CT abdomen pelvis with contrast to rule out renal abscess  COPD exacerbation - respiratory status stable - continue duoneb every 12 hours scheduled and albuterol every 2 hours as needed for shortness of breath or wheezing  - much improved w/ no wheezing on exam today - steroids discontinued - oxygen support via [place] to keep O2 sats above 90%  Diabetes mellitus type 2, controlled - Hemoglobin A1c 6.7 08/16/14 indicating good glycemic control  - continue SSI  Pituitary adenoma - Stable  Essential hypertension-uncontrolled - uncontrolled in setting of nausea and vomiting - patient currently on hydralazine Increase hydralazine to 100   mg by mouth every 8 hours Continue isosorbide dinitrate, Norvasc  Dyslipidemia - continue pravastatin at bedtime   DVT Prophylaxis  - heparin subQ   Code Status: Full.  Family Communication:  Family not at the bedside Disposition Plan: remains inpatient    Procedures and diagnostic studies:    Ct Angio Chest Pe W/cm &/or Wo Cm 01/26/2015   Negative for pulmonary embolism. Mild upper lobe emphysematous blebs are present.     Medical Consultants:  None   Anti-Infectives:   Levaquin 01/27/2015 > 1/31 Unasyn 1/31 >  Reyne Dumas, MD [facility] Pager [phone]  Contact MD directly via text page:      [email or web address]      password [facility]  01/29/2015, 10:52 AM   LOS: 3 days    HPI/Subjective: Denies nausea w/ vomiting, some right flank pain,. Hypertensive today  Objective: Filed Vitals:   01/28/15 0838 01/28/15 1300 01/28/15 2132 01/29/15 0510  BP:  180/69 199/72 180/54  Pulse:  81 81 70  Temp:  98.9 F (37.2 C) 99.8 F (37.7 C) 98.7 F (37.1 C)  TempSrc:  Oral Oral Oral  Resp:  16 16 16   Height:      Weight:      SpO2: 97% 98% 95% 98%    Intake/Output Summary (Last 24 hours) at 01/29/15 1052 Last data filed at 01/29/15 0745  Gross per 24 hour  Intake      0 ml  Output   1950 ml  Net  -1950 ml    Exam: General: No acute respiratory distress Lungs: Clear to auscultation bilaterally without wheezes or crackles Cardiovascular: Regular rate and rhythm without murmur gallop or rub normal S1 and S2 Abdomen: tender to deep palpation right flank region, soft, bowel sounds positive, no rebound, no ascites, no appreciable mass Extremities: No significant cyanosis, clubbing, or edema bilateral lower extremities   Data Reviewed: Basic Metabolic Panel:  Recent Labs Lab 01/26/15 1949 01/29/15 0525  NA 138 141  K 3.7 3.4*  CL 105 108  CO2 24 24  GLUCOSE 115* 171*  BUN 9 15  CREATININE 0.71 0.82  CALCIUM 9.1 9.1   Liver Function Tests:  Recent Labs Lab 01/26/15 1949 01/29/15 0525  AST 36 53*  ALT 34 39  ALKPHOS 60 48  BILITOT 0.2* 0.8  PROT 7.1 6.8  ALBUMIN 3.7 3.4*    Recent Labs Lab 01/29/15 0525  LIPASE 34   No results for input(s): AMMONIA in the last 168 hours. CBC:  Recent Labs Lab  01/26/15 1949 01/29/15 0525  WBC 14.7* 17.7*  HGB 12.2* 12.0*  HCT 38.3* 37.9*  MCV 75.0* 73.3*  PLT 282 273   CBG:  Recent Labs Lab 01/28/15 0736 01/28/15 1120 01/28/15 1631 01/28/15 2144 01/29/15 0723  GLUCAP 185* 231* 119* 224* 133*    Recent Results (from the past 240 hour(s))  Urine culture     Status: None   Collection Time: 01/26/15  9:52 PM  Result Value Ref Range Status   Specimen Description URINE, CLEAN CATCH  Final   Special Requests NONE  Final   Colony Count   Final    >=100,000 COLONIES/ML Performed at [facility]    Culture   Final    ENTEROCOCCUS SPECIES Performed at [facility]    Report Status 01/29/2015 FINAL  Final   Organism ID, Bacteria ENTEROCOCCUS SPECIES  Final      Susceptibility   Enterococcus species - MIC*    AMPICILLIN <=2 SENSITIVE Sensitive     LEVOFLOXACIN >=8 RESISTANT Resistant     NITROFURANTOIN <=16 SENSITIVE Sensitive     VANCOMYCIN 1 SENSITIVE Sensitive     TETRACYCLINE >=16 RESISTANT Resistant     * ENTEROCOCCUS SPECIES  Culture, blood (routine x 2)     Status: None (Preliminary result)   Collection Time: 01/27/15  2:15 AM  Result Value Ref Range Status   Specimen Description BLOOD RIGHT ARM  Final   Special Requests BOTTLES DRAWN AEROBIC ONLY 10CC  Final   Culture   Final           BLOOD CULTURE RECEIVED NO GROWTH TO DATE CULTURE WILL BE HELD FOR 5 DAYS BEFORE ISSUING A FINAL NEGATIVE REPORT Performed at [facility]    Report Status PENDING  Incomplete  Culture, blood (routine x 2)     Status: None (Preliminary result)   Collection Time: 01/27/15  2:20 AM  Result Value Ref Range Status   Specimen Description BLOOD RIGHT HAND  Final   Special Requests BOTTLES DRAWN AEROBIC ONLY 10CC  Final   Culture   Final           BLOOD CULTURE RECEIVED NO GROWTH TO DATE CULTURE WILL BE HELD FOR 5 DAYS BEFORE ISSUING A FINAL NEGATIVE REPORT Performed at [facility]    Report Status PENDING   Incomplete     Scheduled Meds: . amLODipine  10 mg Oral Daily  . aspirin EC  81 mg Oral Daily  . B-complex with vitamin  1 tablet Oral Daily  . dextromethorphan-guai  1 tablet Oral BID  . heparin  5,000 Units Subcutaneous 3 times per day  . hydrALAZINE  50 mg Oral 3 times per day  . insulin aspart  0-9 Units Subcutaneous TID WC  . ipratropium-albuterol  3 mL Nebulization BID  . isosorbide dinitrate  10 mg Oral TID  . levofloxacin  500 mg Intravenous Q24H  . methylPREDNISolone   60 mg Intravenous Q12H  . nicotine  21 mg Transdermal Daily  . phenazopyridine  200 mg Oral TID WC  . pravastatin  20 mg Oral QHS  . tamsulosin  0.4 mg Oral Daily

## 2015-01-29 NOTE — Progress Notes (Signed)
PT Cancellation Note  Patient Details Name: [name] MRN: [id number] DOB: [birth year]   Cancelled Treatment:    Reason Eval/Treat Not Completed: Fatigue/lethargy limiting ability to participate (pt did not awaken while in room)   Ada E 01/29/2015, 2:24 PM Carmelia Bake, PT, DPT 01/29/2015 Pager: [phone]

## 2015-01-30 LAB — CBC
HEMATOCRIT: 35.9 % — AB (ref 39.0–52.0)
HEMOGLOBIN: 11.1 g/dL — AB (ref 13.0–17.0)
MCH: 23.2 pg — ABNORMAL LOW (ref 26.0–34.0)
MCHC: 30.9 g/dL (ref 30.0–36.0)
MCV: 74.9 fL — AB (ref 78.0–100.0)
Platelets: 226 10*3/uL (ref 150–400)
RBC: 4.79 MIL/uL (ref 4.22–5.81)
RDW: 15.3 % (ref 11.5–15.5)
WBC: 11.4 10*3/uL — ABNORMAL HIGH (ref 4.0–10.5)

## 2015-01-30 LAB — COMPREHENSIVE METABOLIC PANEL
ALBUMIN: 3.2 g/dL — AB (ref 3.5–5.2)
ALK PHOS: 43 U/L (ref 39–117)
ALT: 53 U/L (ref 0–53)
ANION GAP: 8 (ref 5–15)
AST: 48 U/L — AB (ref 0–37)
BUN: 20 mg/dL (ref 6–23)
CHLORIDE: 108 mmol/L (ref 96–112)
CO2: 24 mmol/L (ref 19–32)
CREATININE: 0.94 mg/dL (ref 0.50–1.35)
Calcium: 8.5 mg/dL (ref 8.4–10.5)
GFR calc non Af Amer: 88 mL/min — ABNORMAL LOW (ref >90)
Glucose, Bld: 134 mg/dL — ABNORMAL HIGH (ref 70–99)
Potassium: 3.9 mmol/L (ref 3.5–5.1)
Sodium: 140 mmol/L (ref 135–145)
TOTAL PROTEIN: 6.1 g/dL (ref 6.0–8.3)
Total Bilirubin: 0.6 mg/dL (ref 0.3–1.2)

## 2015-01-30 LAB — GLUCOSE, CAPILLARY: GLUCOSE-CAPILLARY: 111 mg/dL — AB (ref 70–99)

## 2015-01-30 LAB — HIV ANTIBODY (ROUTINE TESTING W REFLEX): HIV Screen 4th Generation wRfx: NONREACTIVE

## 2015-01-30 MED ORDER — HYDRALAZINE HCL 100 MG PO TABS: 100.0000 mg | ORAL_TABLET | Freq: Three times a day (TID) | ORAL | Status: DC

## 2015-01-30 MED ORDER — AMOXICILLIN 500 MG PO TABS: 500.0000 mg | ORAL_TABLET | Freq: Three times a day (TID) | ORAL | Status: AC

## 2015-01-30 MED ORDER — NICOTINE 21 MG/24HR TD PT24: 21.0000 mg | MEDICATED_PATCH | Freq: Every day | TRANSDERMAL | Status: DC

## 2015-01-30 NOTE — Discharge Summary (Signed)
Physician Discharge Summary  [name] MRN: [id number] DOB/AGE: 1951-09-27 64 y.o.  PCP: Philis Fendt, MD   Admit date: 01/26/2015 Discharge date: 01/30/2015  Discharge Diagnoses:   Enterococcus UTI Principal Problem:   UTI (lower urinary tract infection) Active Problems:   Diabetes mellitus type 2, uncontrolled   Tobacco use disorder   Essential hypertension   History of TIA (transient ischemic attack)   Cough   HCAP (healthcare-associated pneumonia)  Follow-up recommendations Follow-up with PCP in 5-7 days PCP to arrange for a 2-D echo, last echo was in 2011 cbc, BMP in one week    Medication List    STOP taking these medications        ampicillin 250 MG capsule  Commonly known as:  PRINCIPEN     phenazopyridine 200 MG tablet  Commonly known as:  PYRIDIUM      TAKE these medications        acetaminophen 500 MG tablet  Commonly known as:  TYLENOL  Take 500 mg by mouth every 6 (six) hours as needed for headache.     amLODipine 10 MG tablet  Commonly known as:  NORVASC  Take 10 mg by mouth daily.     amoxicillin 500 MG tablet  Commonly known as:  AMOXIL  Take 1 tablet (500 mg total) by mouth 3 (three) times daily.     aspirin EC 81 MG tablet  Take 81 mg by mouth daily.     b complex vitamins tablet  Take 1 tablet by mouth daily.     hydrALAZINE 100 MG tablet  Commonly known as:  APRESOLINE  Take 1 tablet (100 mg total) by mouth every 8 (eight) hours.     isosorbide dinitrate 10 MG tablet  Commonly known as:  ISORDIL  Take 1 tablet (10 mg total) by mouth 3 (three) times daily.     metFORMIN 500 MG tablet  Commonly known as:  GLUCOPHAGE  Take 500 mg by mouth 2 (two) times daily with a meal.     nicotine 21 mg/24hr patch  Commonly known as:  NICODERM CQ - dosed in mg/24 hours  Place 1 patch (21 mg total) onto the skin daily.     oxyCODONE-acetaminophen 5-325 MG per tablet  Commonly known as:  PERCOCET  Take 1 tablet by mouth every 4  (four) hours as needed for severe pain.     pravastatin 20 MG tablet  Commonly known as:  PRAVACHOL  Take 20 mg by mouth at bedtime.     tamsulosin 0.4 MG Caps capsule  Commonly known as:  FLOMAX  Take 1 capsule (0.4 mg total) by mouth daily.     traZODone 100 MG tablet  Commonly known as:  DESYREL  Take 1 tablet (100 mg total) by mouth at bedtime as needed for sleep.        Discharge Condition:   Disposition: 01-Home or Self Care   Consults:None  Significant Diagnostic Studies: Ct Angio Chest Pe W/cm &/or Wo Cm  01/26/2015   CLINICAL DATA:  Midsternal chest pain and lightheadedness since yesterday. Pleuritic pain.  EXAM: CT ANGIOGRAPHY CHEST WITH CONTRAST  TECHNIQUE: Multidetector CT imaging of the chest was performed using the standard protocol during bolus administration of intravenous contrast. Multiplanar CT image reconstructions and MIPs were obtained to evaluate the vascular anatomy.  CONTRAST:  174m OMNIPAQUE IOHEXOL 350 MG/ML SOLN  COMPARISON:  08/15/2014  FINDINGS: Cardiovascular: There is good opacification of the pulmonary vasculature. There is no pulmonary  embolism. The thoracic aorta is normal in caliber and intact.  Lungs: Mild emphysematous blebs are present in the upper lobe periphery bilaterally. Dependent posterior base opacities are present in both lungs, likely a combination of mild atelectasis and mild fibrotic changes.  Central airways: Patent  Effusions: None  Lymphadenopathy: None  Esophagus: Unremarkable  Upper abdomen: No acute findings  Musculoskeletal: No significant abnormality  Review of the MIP images confirms the above findings.  IMPRESSION: Negative for pulmonary embolism. Mild upper lobe emphysematous blebs are present.   Electronically Signed   By: Andreas Newport M.D.   On: 01/26/2015 23:34   Ct Abdomen Pelvis W Contrast  01/29/2015   CLINICAL DATA:  Abdominal pain and leukocytosis. Urinary tract infection. The patient recently hospitalized from  01/02/2015 and 01/05/2015 for pyelonephritis versus prostatitis.  EXAM: CT ABDOMEN AND PELVIS WITH CONTRAST  TECHNIQUE: Multidetector CT imaging of the abdomen and pelvis was performed using the standard protocol following bolus administration of intravenous contrast.  CONTRAST:  100 mL OMNIPAQUE IOHEXOL 300 MG/ML  SOLN  COMPARISON:  CT abdomen and pelvis 11/03/2014, 10/17/2013, 05/15/2014 and 08/15/2014.  FINDINGS: The lung bases demonstrate dependent atelectasis. There is cardiomegaly. Calcific coronary artery disease is seen. No pleural or pericardial effusion.  The kidneys demonstrate normal and symmetric enhancement. Chronic thickening of the urinary bladder wall with large diverticula present does not appear notably changed. The prostate gland is unremarkable. Seminal vesicles appear normal.  1.9 cm lesion in the left hepatic lobe is compatible with a small cyst or hemangioma and unchanged. The liver otherwise appears normal. The gallbladder, adrenal glands, spleen, pancreas and biliary tree are unremarkable. Colonic diverticulosis is most notable in the sigmoid but no evidence diverticulitis is seen. The stomach, small bowel and appendix appear normal. Aortoiliac atherosclerosis without aneurysm is identified. No lytic or sclerotic bony lesion is seen.  IMPRESSION: No acute abnormality.  Chronic thickening of the urinary bladder wall with multiple bladder diverticula. The appearance is unchanged.  Calcific aortic and coronary atherosclerosis.  Diverticulosis without diverticulitis.   Electronically Signed   By: Inge Rise M.D.   On: 01/29/2015 13:58   US Abdomen Limited Ruq  01/12/2015   CLINICAL DATA:  Right upper quadrant pain.  EXAM: US ABDOMEN LIMITED - RIGHT UPPER QUADRANT  COMPARISON:  CT 08/15/2014, 05/15/2014.  FINDINGS: Gallbladder:  Sludge.  Gallbladder wall thickness 2 mm.  Negative Murphy sign.  Common bile duct:  Diameter: 4 mm.  Liver:  Stable 2.3 cm complex cyst left hepatic lobe.   IMPRESSION: 1. Sludge in the gallbladder. No stones identified. No gallbladder distention or gallbladder wall thickening. No pericholecystic fluid collection. Negative Murphy's sign . No biliary distention. 2. Stable 2.3 cm complex cyst left hepatic lobe.   Electronically Signed   By: Marcello Moores  Register   On: 01/12/2015 07:44       Microbiology: Recent Results (from the past 240 hour(s))  Urine culture     Status: None   Collection Time: 01/26/15  9:52 PM  Result Value Ref Range Status   Specimen Description URINE, CLEAN CATCH  Final   Special Requests NONE  Final   Colony Count   Final    >=100,000 COLONIES/ML Performed at [facility]    Culture   Final    ENTEROCOCCUS SPECIES Performed at [facility]    Report Status 01/29/2015 FINAL  Final   Organism ID, Bacteria ENTEROCOCCUS SPECIES  Final      Susceptibility   Enterococcus species -  MIC*    AMPICILLIN <=2 SENSITIVE Sensitive     LEVOFLOXACIN >=8 RESISTANT Resistant     NITROFURANTOIN <=16 SENSITIVE Sensitive     VANCOMYCIN 1 SENSITIVE Sensitive     TETRACYCLINE >=16 RESISTANT Resistant     * ENTEROCOCCUS SPECIES  Culture, blood (routine x 2)     Status: None (Preliminary result)   Collection Time: 01/27/15  2:15 AM  Result Value Ref Range Status   Specimen Description BLOOD RIGHT ARM  Final   Special Requests BOTTLES DRAWN AEROBIC ONLY 10CC  Final   Culture   Final           BLOOD CULTURE RECEIVED NO GROWTH TO DATE CULTURE WILL BE HELD FOR 5 DAYS BEFORE ISSUING A FINAL NEGATIVE REPORT Performed at [facility]    Report Status PENDING  Incomplete  Culture, blood (routine x 2)     Status: None (Preliminary result)   Collection Time: 01/27/15  2:20 AM  Result Value Ref Range Status   Specimen Description BLOOD RIGHT HAND  Final   Special Requests BOTTLES DRAWN AEROBIC ONLY 10CC  Final   Culture   Final           BLOOD CULTURE RECEIVED NO GROWTH TO DATE CULTURE WILL BE HELD FOR 5 DAYS BEFORE  ISSUING A FINAL NEGATIVE REPORT Performed at [facility]    Report Status PENDING  Incomplete     Labs: Results for orders placed or performed during the hospital encounter of 01/26/15 (from the past 48 hour(s))  Glucose, capillary     Status: Abnormal   Collection Time: 01/28/15 11:20 AM  Result Value Ref Range   Glucose-Capillary 231 (H) 70 - 99 mg/dL   Comment 1 Notify RN   Glucose, capillary     Status: Abnormal   Collection Time: 01/28/15  4:31 PM  Result Value Ref Range   Glucose-Capillary 119 (H) 70 - 99 mg/dL   Comment 1 Notify RN   Glucose, capillary     Status: Abnormal   Collection Time: 01/28/15  9:44 PM  Result Value Ref Range   Glucose-Capillary 224 (H) 70 - 99 mg/dL  Comprehensive metabolic panel     Status: Abnormal   Collection Time: 01/29/15  5:25 AM  Result Value Ref Range   Sodium 141 135 - 145 mmol/L   Potassium 3.4 (L) 3.5 - 5.1 mmol/L   Chloride 108 96 - 112 mmol/L   CO2 24 19 - 32 mmol/L   Glucose, Bld 171 (H) 70 - 99 mg/dL   BUN 15 6 - 23 mg/dL   Creatinine, Ser 0.82 0.50 - 1.35 mg/dL   Calcium 9.1 8.4 - 10.5 mg/dL   Total Protein 6.8 6.0 - 8.3 g/dL   Albumin 3.4 (L) 3.5 - 5.2 g/dL   AST 53 (H) 0 - 37 U/L   ALT 39 0 - 53 U/L   Alkaline Phosphatase 48 39 - 117 U/L   Total Bilirubin 0.8 0.3 - 1.2 mg/dL   GFR calc non Af Amer >90 >90 mL/min   GFR calc Af Amer >90 >90 mL/min    Comment: (NOTE) The eGFR has been calculated using the CKD EPI equation. This calculation has not been validated in all clinical situations. eGFR's persistently <90 mL/min signify possible Chronic Kidney Disease.    Anion gap 9 5 - 15  CBC     Status: Abnormal   Collection Time: 01/29/15  5:25 AM  Result Value Ref Range   WBC  17.7 (H) 4.0 - 10.5 K/uL   RBC 5.17 4.22 - 5.81 MIL/uL   Hemoglobin 12.0 (L) 13.0 - 17.0 g/dL   HCT 37.9 (L) 39.0 - 52.0 %   MCV 73.3 (L) 78.0 - 100.0 fL   MCH 23.2 (L) 26.0 - 34.0 pg   MCHC 31.7 30.0 - 36.0 g/dL   RDW 15.1 11.5 -  15.5 %   Platelets 273 150 - 400 K/uL  Lipase, blood     Status: None   Collection Time: 01/29/15  5:25 AM  Result Value Ref Range   Lipase 34 11 - 59 U/L  Glucose, capillary     Status: Abnormal   Collection Time: 01/29/15  7:23 AM  Result Value Ref Range   Glucose-Capillary 133 (H) 70 - 99 mg/dL  Glucose, capillary     Status: Abnormal   Collection Time: 01/29/15 11:59 AM  Result Value Ref Range   Glucose-Capillary 139 (H) 70 - 99 mg/dL  Glucose, capillary     Status: Abnormal   Collection Time: 01/29/15  5:07 PM  Result Value Ref Range   Glucose-Capillary 144 (H) 70 - 99 mg/dL  Glucose, capillary     Status: Abnormal   Collection Time: 01/29/15 10:05 PM  Result Value Ref Range   Glucose-Capillary 195 (H) 70 - 99 mg/dL   Comment 1 Notify RN   CBC     Status: Abnormal   Collection Time: 01/30/15  5:00 AM  Result Value Ref Range   WBC 11.4 (H) 4.0 - 10.5 K/uL   RBC 4.79 4.22 - 5.81 MIL/uL   Hemoglobin 11.1 (L) 13.0 - 17.0 g/dL   HCT 35.9 (L) 39.0 - 52.0 %   MCV 74.9 (L) 78.0 - 100.0 fL   MCH 23.2 (L) 26.0 - 34.0 pg   MCHC 30.9 30.0 - 36.0 g/dL   RDW 15.3 11.5 - 15.5 %   Platelets 226 150 - 400 K/uL  Comprehensive metabolic panel     Status: Abnormal   Collection Time: 01/30/15  5:00 AM  Result Value Ref Range   Sodium 140 135 - 145 mmol/L   Potassium 3.9 3.5 - 5.1 mmol/L   Chloride 108 96 - 112 mmol/L   CO2 24 19 - 32 mmol/L   Glucose, Bld 134 (H) 70 - 99 mg/dL   BUN 20 6 - 23 mg/dL   Creatinine, Ser 0.94 0.50 - 1.35 mg/dL   Calcium 8.5 8.4 - 10.5 mg/dL   Total Protein 6.1 6.0 - 8.3 g/dL   Albumin 3.2 (L) 3.5 - 5.2 g/dL   AST 48 (H) 0 - 37 U/L   ALT 53 0 - 53 U/L   Alkaline Phosphatase 43 39 - 117 U/L   Total Bilirubin 0.6 0.3 - 1.2 mg/dL   GFR calc non Af Amer 88 (L) >90 mL/min   GFR calc Af Amer >90 >90 mL/min    Comment: (NOTE) The eGFR has been calculated using the CKD EPI equation. This calculation has not been validated in all clinical situations. eGFR's  persistently <90 mL/min signify possible Chronic Kidney Disease.    Anion gap 8 5 - 15  Glucose, capillary     Status: Abnormal   Collection Time: 01/30/15  7:26 AM  Result Value Ref Range   Glucose-Capillary 111 (H) 70 - 99 mg/dL     Brief narrative:   64 year old male with past medical history of diabetes, hypertension, polysubstance abuse, pituitary adenoma, recent hospitalization for pyelonephritis (urine culture grew  group B strep) who presented to [facility] ED with shortness of breath and cough productive of yellow sputum for 2 days prior to this admission. No fevers.  On admission patient was hemodynamically stable. CT angiogram was negative for pulmonary embolism or infiltrate. Blood work showed leukocytosis of 14.7, UA showed large leukocytes and many bacteria. Pt was started on Levaquin.  Assessment/Plan:    Enterococcus UTI / Pyelo - UA on admission showed large leukocytes and many bacteria - urine culture now revealing enterococcus  Started on Unasyn , urine culture shows enterococcus   CT abdomen pelvis with contrast did not show  renal abscess Cont amoxicillin for another 7 days   COPD exacerbation - respiratory status stable - continue duoneb every 12 hours scheduled and albuterol every 2 hours as needed for shortness of breath or wheezing  - much improved w/ no wheezing on exam today - steroids discontinued - oxygen support via [place] to keep O2 sats above 90%  Diabetes mellitus type 2, controlled - Hemoglobin A1c 6.7 08/16/14 indicating good glycemic control  - continue metformin   Pituitary adenoma - Stable  Essential hypertension-uncontrolled - uncontrolled in setting of nausea and vomiting - patient currently on hydralazine Increased hydralazine to 100 mg by mouth every 8 hours Continue isosorbide dinitrate, Norvasc Patient would benefit from outpt echo to evaluate for LVH and heart murmur to be arranged for by PCP   Dyslipidemia - continue pravastatin  at bedtime   DVT Prophylaxis  - heparin subQ   Code Status: Full.      Discharge Exam:    Blood pressure 128/63, pulse 64, temperature 97.9 F (36.6 C), temperature source Oral, resp. rate 18, height '5\' 9"'  (1.753 m), weight 79.5 kg (175 lb 4.3 oz), SpO2 99 %.  General: No acute respiratory distress Lungs: Clear to auscultation bilaterally without wheezes or crackles Cardiovascular: Regular rate and rhythm without murmur gallop or rub normal S1 and S2 Abdomen: tender to deep palpation right flank region, soft, bowel sounds positive, no rebound, no ascites, no appreciable mass Extremities: No significant cyanosis, clubbing, or edema bilateral lower extremities        Discharge Instructions    Diet - low sodium heart healthy    Complete by:  As directed      Increase activity slowly    Complete by:  As directed            Follow-up Information    Follow up with AVBUERE,EDWIN A, MD. Schedule an appointment as soon as possible for a visit in 1 week.   Specialty:  Internal Medicine   Contact information:   [address] [place] [address] [phone]       Signed: Reyne Dumas 01/30/2015, 10:05 AM

## 2015-01-30 NOTE — Progress Notes (Signed)
OT Cancellation Note  Patient Details Name: [name] MRN: [id number] DOB: 1951/12/22   Cancelled Treatment:    Reason Eval/Treat Not Completed: OT screened, no needs identified, will sign off -- Patient has discharge orders. Spoke to patient and he denies need for OT services. Will sign off.  Deontrae Drinkard A 01/30/2015, 11:34 AM

## 2015-01-30 NOTE — Progress Notes (Signed)
[name] Homeyer to be D/C'd Home per MD order.  Discussed prescriptions and follow up appointments with the patient. Prescriptions given to patient, medication list explained in detail. Pt verbalized understanding.    Medication List    STOP taking these medications        ampicillin 250 MG capsule  Commonly known as:  PRINCIPEN     phenazopyridine 200 MG tablet  Commonly known as:  PYRIDIUM      TAKE these medications        acetaminophen 500 MG tablet  Commonly known as:  TYLENOL  Take 500 mg by mouth every 6 (six) hours as needed for headache.     amLODipine 10 MG tablet  Commonly known as:  NORVASC  Take 10 mg by mouth daily.     amoxicillin 500 MG tablet  Commonly known as:  AMOXIL  Take 1 tablet (500 mg total) by mouth 3 (three) times daily.     aspirin EC 81 MG tablet  Take 81 mg by mouth daily.     b complex vitamins tablet  Take 1 tablet by mouth daily.     hydrALAZINE 100 MG tablet  Commonly known as:  APRESOLINE  Take 1 tablet (100 mg total) by mouth every 8 (eight) hours.     isosorbide dinitrate 10 MG tablet  Commonly known as:  ISORDIL  Take 1 tablet (10 mg total) by mouth 3 (three) times daily.     metFORMIN 500 MG tablet  Commonly known as:  GLUCOPHAGE  Take 500 mg by mouth 2 (two) times daily with a meal.     nicotine 21 mg/24hr patch  Commonly known as:  NICODERM CQ - dosed in mg/24 hours  Place 1 patch (21 mg total) onto the skin daily.     oxyCODONE-acetaminophen 5-325 MG per tablet  Commonly known as:  PERCOCET  Take 1 tablet by mouth every 4 (four) hours as needed for severe pain.     pravastatin 20 MG tablet  Commonly known as:  PRAVACHOL  Take 20 mg by mouth at bedtime.     tamsulosin 0.4 MG Caps capsule  Commonly known as:  FLOMAX  Take 1 capsule (0.4 mg total) by mouth daily.     traZODone 100 MG tablet  Commonly known as:  DESYREL  Take 1 tablet (100 mg total) by mouth at bedtime as needed for sleep.        Filed Vitals:    01/30/15 0532  BP: 128/63  Pulse: 64  Temp: 97.9 F (36.6 C)  Resp: 18    Skin clean, dry and intact without evidence of skin break down, no evidence of skin tears noted. IV catheter discontinued intact. Site without signs and symptoms of complications. Dressing and pressure applied. Pt denies pain at this time. No complaints noted.  An After Visit Summary was printed and given to the patient. Patient escorted via [place], and D/C home via private auto.  [name] S 01/30/2015 1:03 PM

## 2015-02-02 LAB — CULTURE, BLOOD (ROUTINE X 2)
CULTURE: NO GROWTH
Culture: NO GROWTH

## 2015-02-09 ENCOUNTER — Emergency Department (HOSPITAL_COMMUNITY)
Admission: EM | Admit: 2015-02-09 | Discharge: 2015-02-10 | Disposition: A | Discharge status: Home / Self Care | Payer: Medicaid Other | Attending: Emergency Medicine | Admitting: Emergency Medicine

## 2015-02-09 ENCOUNTER — Encounter (HOSPITAL_COMMUNITY): Payer: Self-pay | Admitting: Emergency Medicine

## 2015-02-09 DIAGNOSIS — Z87828 Personal history of other (healed) physical injury and trauma: Secondary | ICD-10-CM | POA: Insufficient documentation

## 2015-02-09 DIAGNOSIS — I509 Heart failure, unspecified: Secondary | ICD-10-CM | POA: Insufficient documentation

## 2015-02-09 DIAGNOSIS — Z7982 Long term (current) use of aspirin: Secondary | ICD-10-CM | POA: Diagnosis not present

## 2015-02-09 DIAGNOSIS — Z8673 Personal history of transient ischemic attack (TIA), and cerebral infarction without residual deficits: Secondary | ICD-10-CM | POA: Diagnosis not present

## 2015-02-09 DIAGNOSIS — Z79899 Other long term (current) drug therapy: Secondary | ICD-10-CM | POA: Diagnosis not present

## 2015-02-09 DIAGNOSIS — N390 Urinary tract infection, site not specified: Secondary | ICD-10-CM | POA: Insufficient documentation

## 2015-02-09 DIAGNOSIS — Z720 Tobacco use: Secondary | ICD-10-CM | POA: Diagnosis not present

## 2015-02-09 DIAGNOSIS — I10 Essential (primary) hypertension: Secondary | ICD-10-CM | POA: Diagnosis not present

## 2015-02-09 DIAGNOSIS — R52 Pain, unspecified: Secondary | ICD-10-CM | POA: Diagnosis present

## 2015-02-09 DIAGNOSIS — Z86011 Personal history of benign neoplasm of the brain: Secondary | ICD-10-CM | POA: Insufficient documentation

## 2015-02-09 DIAGNOSIS — E119 Type 2 diabetes mellitus without complications: Secondary | ICD-10-CM | POA: Diagnosis not present

## 2015-02-09 DIAGNOSIS — R300 Dysuria: Secondary | ICD-10-CM

## 2015-02-09 NOTE — ED Notes (Signed)
Pt reports yesterday he began having generalized body pain. Pt states he was just released from the hospital and thinks he may have another UTI. Pt alert and oriented.

## 2015-02-10 LAB — URINE MICROSCOPIC-ADD ON

## 2015-02-10 LAB — CBC WITH DIFFERENTIAL/PLATELET
BASOS PCT: 0 % (ref 0–1)
Basophils Absolute: 0 10*3/uL (ref 0.0–0.1)
EOS ABS: 0.2 10*3/uL (ref 0.0–0.7)
Eosinophils Relative: 1 % (ref 0–5)
HCT: 38.7 % — ABNORMAL LOW (ref 39.0–52.0)
Hemoglobin: 12.2 g/dL — ABNORMAL LOW (ref 13.0–17.0)
Lymphocytes Relative: 19 % (ref 12–46)
Lymphs Abs: 3.1 10*3/uL (ref 0.7–4.0)
MCH: 23.5 pg — AB (ref 26.0–34.0)
MCHC: 31.5 g/dL (ref 30.0–36.0)
MCV: 74.6 fL — ABNORMAL LOW (ref 78.0–100.0)
MONO ABS: 1.1 10*3/uL — AB (ref 0.1–1.0)
Monocytes Relative: 7 % (ref 3–12)
NEUTROS ABS: 11.7 10*3/uL — AB (ref 1.7–7.7)
NEUTROS PCT: 73 % (ref 43–77)
Platelets: 293 10*3/uL (ref 150–400)
RBC: 5.19 MIL/uL (ref 4.22–5.81)
RDW: 15.3 % (ref 11.5–15.5)
WBC: 16 10*3/uL — ABNORMAL HIGH (ref 4.0–10.5)

## 2015-02-10 LAB — COMPREHENSIVE METABOLIC PANEL
ALT: 33 U/L (ref 0–53)
AST: 27 U/L (ref 0–37)
Albumin: 4 g/dL (ref 3.5–5.2)
Alkaline Phosphatase: 54 U/L (ref 39–117)
Anion gap: 8 (ref 5–15)
BUN: 11 mg/dL (ref 6–23)
CALCIUM: 9.2 mg/dL (ref 8.4–10.5)
CO2: 25 mmol/L (ref 19–32)
Chloride: 105 mmol/L (ref 96–112)
Creatinine, Ser: 0.87 mg/dL (ref 0.50–1.35)
GFR calc Af Amer: >90 mL/min (ref >90)
GLUCOSE: 157 mg/dL — AB (ref 70–99)
Potassium: 3.8 mmol/L (ref 3.5–5.1)
Sodium: 138 mmol/L (ref 135–145)
TOTAL PROTEIN: 7.6 g/dL (ref 6.0–8.3)
Total Bilirubin: 0.7 mg/dL (ref 0.3–1.2)

## 2015-02-10 LAB — URINALYSIS, ROUTINE W REFLEX MICROSCOPIC
BILIRUBIN URINE: NEGATIVE
GLUCOSE, UA: NEGATIVE mg/dL
Ketones, ur: NEGATIVE mg/dL
Nitrite: NEGATIVE
PROTEIN: 30 mg/dL — AB
Specific Gravity, Urine: 1.005 (ref 1.005–1.030)
Urobilinogen, UA: 0.2 mg/dL (ref 0.0–1.0)
pH: 6 (ref 5.0–8.0)

## 2015-02-10 LAB — LIPASE, BLOOD: Lipase: 25 U/L (ref 11–59)

## 2015-02-10 LAB — LACTIC ACID, PLASMA: Lactic Acid, Venous: 1.9 mmol/L (ref 0.5–2.0)

## 2015-02-10 MED ORDER — CEPHALEXIN 500 MG PO CAPS: 500.0000 mg | ORAL_CAPSULE | Freq: Four times a day (QID) | ORAL | Status: DC

## 2015-02-10 MED ORDER — DEXTROSE 5 % IV SOLN
1.0000 g | Freq: Once | INTRAVENOUS | Status: AC
  Administered 2015-02-10: 1 g via INTRAVENOUS
  Filled 2015-02-10: qty 10

## 2015-02-10 MED ORDER — KETOROLAC TROMETHAMINE 30 MG/ML IJ SOLN
30.0000 mg | Freq: Once | INTRAMUSCULAR | Status: AC
  Administered 2015-02-10: 30 mg via INTRAVENOUS
  Filled 2015-02-10: qty 1

## 2015-02-10 MED ORDER — PHENAZOPYRIDINE HCL 100 MG PO TABS
100.0000 mg | ORAL_TABLET | Freq: Three times a day (TID) | ORAL | Status: DC
  Administered 2015-02-10: 100 mg via ORAL
  Filled 2015-02-10: qty 1

## 2015-02-10 MED ORDER — ONDANSETRON HCL 4 MG/2ML IJ SOLN
4.0000 mg | Freq: Once | INTRAMUSCULAR | Status: AC
  Administered 2015-02-10: 4 mg via INTRAVENOUS
  Filled 2015-02-10: qty 2

## 2015-02-10 MED ORDER — PHENAZOPYRIDINE HCL 100 MG PO TABS: 100.0000 mg | ORAL_TABLET | Freq: Three times a day (TID) | ORAL | Status: DC

## 2015-02-10 MED ORDER — OXYCODONE-ACETAMINOPHEN 5-325 MG PO TABS: 1.0000 | ORAL_TABLET | Freq: Four times a day (QID) | ORAL | Status: DC | PRN

## 2015-02-10 NOTE — ED Notes (Signed)
Multiple attempts made by nursing staff to start PIV site without success ED Charge at bedside to attempt placement

## 2015-02-10 NOTE — ED Notes (Signed)
Patient refusing rectal temp at this time.

## 2015-02-10 NOTE — Discharge Instructions (Signed)
It is important that you make an appointment with the Urologist for further evaluation of your frequent infections

## 2015-02-10 NOTE — ED Notes (Signed)
Patient states that he can't go home because he is in too much pain  Patient has been sleeping since 0115 when medication was given, see MAR

## 2015-02-10 NOTE — ED Provider Notes (Signed)
CSN: [id number]     Arrival date & time 02/09/15  2333 History   First MD Initiated Contact with Patient 02/09/15 2345     Chief Complaint  Patient presents with  . Possible UTI   . Generalized Body Aches   LEWIN PELLOW is a 64 y.o. male with a history of diabetes mellitus, hypertension, narcotic abuse, CHF, alcohol dependence and UTI who presents emergency department complaining of dysuria for 2 days associated with generalized body aches. Patient was recently admitted on 01/26/2015 for a urinary tract infection and pneumonia. Patient reports he believes his urinary tract infection has returned. He complains of dysuria, urinary frequency, urinary urgency and generalized abdominal pain. He also reports nausea and has vomited twice today. Also reports generalized body aches and headache. He reports his pain is 10/10. He also reports pain in his penis. He has attempted no treatments today.  He denies recent sexual activity. He denies fevers, testicle pain, penile discharge, hematuria, hematemesis, hematochezia, diarrhea, cough, wheezing, rashes, or shortness of breath.  (Consider location/radiation/quality/duration/timing/severity/associated sxs/prior Treatment) HPI  Past Medical History  Diagnosis Date  . Hypertension   . Diabetes mellitus   . Brain tumor   . CVA (cerebral vascular accident)   . Narcotic abuse   . CHF (congestive heart failure)   . TIA (transient ischemic attack) 09/06/2012  . Vertigo 09/06/2012  . HTN (hypertension), malignant 09/06/2012  . Tobacco use disorder 12/10/2013  . Alcohol dependence 12/10/2013  . 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 No pertinent past surgical history. Family History  Problem Relation Age of Onset  . CAD Father   . Diabetes Brother   . Diabetes Sister   . Hypertension Father   . Hypertension Mother    History  Substance Use Topics  . Smoking status: Current Every Day Smoker -- 0.50 packs/day    Types: Cigarettes  .  Smokeless tobacco: Current User  . Alcohol Use: 0.0 oz/week    0 Standard drinks or equivalent per week     Comment: 6 pack a week    Review of Systems  Constitutional: Negative for fever and chills.  HENT: Negative for congestion, ear pain and sore throat.   Eyes: Negative for pain and visual disturbance.  Respiratory: Negative for cough, shortness of breath and wheezing.   Cardiovascular: Negative for chest pain and palpitations.  Gastrointestinal: Positive for nausea, vomiting and abdominal pain. Negative for diarrhea, blood in stool and rectal pain.  Genitourinary: Positive for dysuria, urgency, frequency, flank pain and penile pain. Negative for hematuria, discharge, penile swelling, scrotal swelling, difficulty urinating, genital sores and testicular pain.  Musculoskeletal: Positive for myalgias.  Skin: Negative for rash.  Neurological: Positive for headaches. Negative for dizziness and light-headedness.      Allergies  Review of patient's allergies indicates no known allergies.  Home Medications   Prior to Admission medications   Medication Sig Start Date End Date Taking? Authorizing Provider  acetaminophen (TYLENOL) 500 MG tablet Take 500 mg by mouth every 6 (six) hours as needed for headache.   Yes Historical Provider, MD  amLODipine (NORVASC) 10 MG tablet Take 10 mg by mouth daily.   Yes Historical Provider, MD  aspirin EC 81 MG tablet Take 81 mg by mouth daily.   Yes Historical Provider, MD  b complex vitamins tablet Take 1 tablet by mouth daily.   Yes Historical Provider, MD  hydrALAZINE (APRESOLINE) 100 MG tablet Take 1 tablet (100 mg total) by mouth every 8 (eight)  hours. 01/30/15  Yes Reyne Dumas, MD  isosorbide dinitrate (ISORDIL) 10 MG tablet Take 1 tablet (10 mg total) by mouth 3 (three) times daily. 12/27/13  Yes Debbe Odea, MD  metFORMIN (GLUCOPHAGE) 500 MG tablet Take 500 mg by mouth 2 (two) times daily with a meal.   Yes Historical Provider, MD  pravastatin  (PRAVACHOL) 20 MG tablet Take 20 mg by mouth at bedtime.   Yes Historical Provider, MD  tamsulosin (FLOMAX) 0.4 MG CAPS capsule Take 1 capsule (0.4 mg total) by mouth daily. 01/15/15  Yes Venetia Maxon Rama, MD  traZODone (DESYREL) 100 MG tablet Take 1 tablet (100 mg total) by mouth at bedtime as needed for sleep. 01/15/15  Yes Christina P Rama, MD  nicotine (NICODERM CQ - DOSED IN MG/24 HOURS) 21 mg/24hr patch Place 1 patch (21 mg total) onto the skin daily. 01/30/15   Reyne Dumas, MD  oxyCODONE-acetaminophen (PERCOCET) 5-325 MG per tablet Take 1 tablet by mouth every 4 (four) hours as needed for severe pain. Patient not taking: Reported on 02/10/2015 09/01/14   Tatyana A Kirichenko, PA-C   BP 195/72 mmHg  Pulse 81  Temp(Src) 98.7 F (37.1 C) (Oral)  Resp 15  SpO2 100% Physical Exam  Constitutional: He appears well-developed and well-nourished. No distress.  HENT:  Head: Normocephalic and atraumatic.  Mouth/Throat: Oropharynx is clear and moist. No oropharyngeal exudate.  Eyes: Conjunctivae are normal. Pupils are equal, round, and reactive to light. Right eye exhibits no discharge. Left eye exhibits no discharge.  Neck: Neck supple. No JVD present.  Cardiovascular: Normal rate, regular rhythm, normal heart sounds and intact distal pulses.  Exam reveals no gallop and no friction rub.   No murmur heard. Pulmonary/Chest: Effort normal and breath sounds normal. No respiratory distress. He has no wheezes. He has no rales.  Abdominal: Soft. Bowel sounds are normal. He exhibits no distension. There is tenderness. There is guarding.  Abdomen is soft. Bowel sounds are present. He is generally tender to palpation in his abdomen and exhibits guarding. Negative psoas and obturator sign. CVA tenderness bilaterally worse on right side.   Genitourinary: Testes normal and penis normal. Right testis shows no mass, no swelling and no tenderness. Left testis shows no mass, no swelling and no tenderness.  Uncircumcised. No phimosis, paraphimosis, penile erythema or penile tenderness. No discharge found.  GU exam is preformed by me with male nursing tech as chaperone. Penis is non-tender and not edematous. Uncircumcised. No discharge or rashes. Scrotal contents is normal. No edema, masses or hernias.   Musculoskeletal: He exhibits no edema.  Lymphadenopathy:    He has no cervical adenopathy.  Neurological: He is alert. Coordination normal.  Skin: Skin is warm and dry. No rash noted. He is not diaphoretic. No erythema. No pallor.  Psychiatric: He has a normal mood and affect. His behavior is normal.  Nursing note and vitals reviewed.   ED Course  Procedures (including critical care time) Labs Review Labs Reviewed  URINALYSIS, ROUTINE W REFLEX MICROSCOPIC  COMPREHENSIVE METABOLIC PANEL  LIPASE, BLOOD  CBC WITH DIFFERENTIAL/PLATELET  LACTIC ACID, PLASMA    Imaging Review No results found.   EKG Interpretation None      Filed Vitals:   02/09/15 2343  BP: 195/72  Pulse: 81  Temp: 98.7 F (37.1 C)  TempSrc: Oral  Resp: 15  SpO2: 100%     MDM   Meds given in ED:  Medications  ketorolac (TORADOL) 30 MG/ML injection 30 mg (30 mg Intravenous  Given 02/10/15 0114)  ondansetron [facility]) injection 4 mg (4 mg Intravenous Given 02/10/15 0114)    New Prescriptions   No medications on file    Final diagnoses:  Dysuria   This is a 64 y.o. male with a history of diabetes mellitus, hypertension, narcotic abuse, CHF, alcohol dependence and UTI who presents emergency department complaining of dysuria for 2 days associated with generalized body aches. Patient was recently admitted on 01/26/2015 for a urinary tract infection and pneumonia. Patient reports he believes his urinary tract infection has returned. He also reports vomiting twice today with associated nausea. Patient is afebrile and nontoxic-appearing. Patient has generalized abdominal tenderness as well as bilateral CVA  tenderness. Suspect possible pyelonephritis. At shift change lab work is pending. Patient care handed off to Florene Glen NP at shift change.   This patient was discussed with Dr. Venora Maples who agrees with plan at this time.      Hanley Hays, PA-C 02/10/15 [place], MD 02/10/15 [phone]

## 2015-02-10 NOTE — ED Provider Notes (Signed)
CSN: [id number]     Arrival date & time 02/09/15  2333 History   First MD Initiated Contact with Patient 02/09/15 2345     Chief Complaint  Patient presents with  . Possible UTI   . Generalized Body Aches     (Consider location/radiation/quality/duration/timing/severity/associated sxs/prior Treatment) HPI  Past Medical History  Diagnosis Date  . Hypertension   . Diabetes mellitus   . Brain tumor   . CVA (cerebral vascular accident)   . Narcotic abuse   . CHF (congestive heart failure)   . TIA (transient ischemic attack) 09/06/2012  . Vertigo 09/06/2012  . HTN (hypertension), malignant 09/06/2012  . Tobacco use disorder 12/10/2013  . Alcohol dependence 12/10/2013  . 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 No pertinent past surgical history. Family History  Problem Relation Age of Onset  . CAD Father   . Diabetes Brother   . Diabetes Sister   . Hypertension Father   . Hypertension Mother    History  Substance Use Topics  . Smoking status: Current Every Day Smoker -- 0.50 packs/day    Types: Cigarettes  . Smokeless tobacco: Current User  . Alcohol Use: 0.0 oz/week    0 Standard drinks or equivalent per week     Comment: 6 pack a week    Review of Systems    Allergies  Review of patient's allergies indicates no known allergies.  Home Medications   Prior to Admission medications   Medication Sig Start Date End Date Taking? Authorizing Provider  acetaminophen (TYLENOL) 500 MG tablet Take 500 mg by mouth every 6 (six) hours as needed for headache.   Yes Historical Provider, MD  amLODipine (NORVASC) 10 MG tablet Take 10 mg by mouth daily.   Yes Historical Provider, MD  aspirin EC 81 MG tablet Take 81 mg by mouth daily.   Yes Historical Provider, MD  b complex vitamins tablet Take 1 tablet by mouth daily.   Yes Historical Provider, MD  hydrALAZINE (APRESOLINE) 100 MG tablet Take 1 tablet (100 mg total) by mouth every 8 (eight) hours. 01/30/15  Yes Reyne Dumas,  MD  isosorbide dinitrate (ISORDIL) 10 MG tablet Take 1 tablet (10 mg total) by mouth 3 (three) times daily. 12/27/13  Yes Debbe Odea, MD  metFORMIN (GLUCOPHAGE) 500 MG tablet Take 500 mg by mouth 2 (two) times daily with a meal.   Yes Historical Provider, MD  pravastatin (PRAVACHOL) 20 MG tablet Take 20 mg by mouth at bedtime.   Yes Historical Provider, MD  tamsulosin (FLOMAX) 0.4 MG CAPS capsule Take 1 capsule (0.4 mg total) by mouth daily. 01/15/15  Yes Venetia Maxon Rama, MD  traZODone (DESYREL) 100 MG tablet Take 1 tablet (100 mg total) by mouth at bedtime as needed for sleep. 01/15/15  Yes Christina P Rama, MD  cephALEXin (KEFLEX) 500 MG capsule Take 1 capsule (500 mg total) by mouth 4 (four) times daily. 02/10/15   Garald Balding, NP  nicotine (NICODERM CQ - DOSED IN MG/24 HOURS) 21 mg/24hr patch Place 1 patch (21 mg total) onto the skin daily. 01/30/15   Reyne Dumas, MD  oxyCODONE-acetaminophen (PERCOCET) 5-325 MG per tablet Take 1 tablet by mouth every 6 (six) hours as needed for severe pain. 02/10/15   Garald Balding, NP  phenazopyridine (PYRIDIUM) 100 MG tablet Take 1 tablet (100 mg total) by mouth 3 (three) times daily with meals. 02/10/15   Garald Balding, NP   BP 175/70 mmHg  Pulse 83  Temp(Src) 98.7 F (37.1 C) (Oral)  Resp 13  SpO2 97% Physical Exam  ED Course  Procedures (including critical care time) Labs Review Labs Reviewed  URINALYSIS, ROUTINE W REFLEX MICROSCOPIC - Abnormal; Notable for the following:    APPearance CLOUDY (*)    Hgb urine dipstick MODERATE (*)    Protein, ur 30 (*)    Leukocytes, UA LARGE (*)    All other components within normal limits  COMPREHENSIVE METABOLIC PANEL - Abnormal; Notable for the following:    Glucose, Bld 157 (*)    All other components within normal limits  URINE MICROSCOPIC-ADD ON - Abnormal; Notable for the following:    Bacteria, UA FEW (*)    All other components within normal limits  LIPASE, BLOOD  LACTIC ACID, PLASMA  CBC WITH  DIFFERENTIAL/PLATELET    Imaging Review No results found.   EKG Interpretation None    Patient has been sleeping comfortably for most of the ED visit with normal vital signs He has been given 1Gm Rocephin and PO Pyridium  Patient with frequent UTI has never been referred to Urology will be DC home with Rx for Keflex, Pyridium and Percocet  MDM   Final diagnoses:  Dysuria  UTI (lower urinary tract infection)         Garald Balding, NP 02/10/15 2001  Hoy Morn, MD 02/10/15 [phone]

## 2015-03-05 ENCOUNTER — Encounter (HOSPITAL_COMMUNITY): Payer: Self-pay | Admitting: Emergency Medicine

## 2015-03-05 ENCOUNTER — Emergency Department (HOSPITAL_COMMUNITY)
Admission: EM | Admit: 2015-03-05 | Discharge: 2015-03-05 | Disposition: A | Discharge status: Home / Self Care | Payer: Medicaid Other | Attending: Emergency Medicine | Admitting: Emergency Medicine

## 2015-03-05 DIAGNOSIS — Z792 Long term (current) use of antibiotics: Secondary | ICD-10-CM | POA: Insufficient documentation

## 2015-03-05 DIAGNOSIS — I10 Essential (primary) hypertension: Secondary | ICD-10-CM | POA: Diagnosis not present

## 2015-03-05 DIAGNOSIS — E119 Type 2 diabetes mellitus without complications: Secondary | ICD-10-CM | POA: Diagnosis not present

## 2015-03-05 DIAGNOSIS — I2510 Atherosclerotic heart disease of native coronary artery without angina pectoris: Secondary | ICD-10-CM | POA: Insufficient documentation

## 2015-03-05 DIAGNOSIS — Z79899 Other long term (current) drug therapy: Secondary | ICD-10-CM | POA: Insufficient documentation

## 2015-03-05 DIAGNOSIS — Z7982 Long term (current) use of aspirin: Secondary | ICD-10-CM | POA: Insufficient documentation

## 2015-03-05 DIAGNOSIS — I509 Heart failure, unspecified: Secondary | ICD-10-CM | POA: Diagnosis not present

## 2015-03-05 DIAGNOSIS — N390 Urinary tract infection, site not specified: Secondary | ICD-10-CM | POA: Diagnosis not present

## 2015-03-05 DIAGNOSIS — Z720 Tobacco use: Secondary | ICD-10-CM | POA: Insufficient documentation

## 2015-03-05 DIAGNOSIS — R300 Dysuria: Secondary | ICD-10-CM | POA: Diagnosis present

## 2015-03-05 DIAGNOSIS — Z8673 Personal history of transient ischemic attack (TIA), and cerebral infarction without residual deficits: Secondary | ICD-10-CM | POA: Diagnosis not present

## 2015-03-05 LAB — COMPREHENSIVE METABOLIC PANEL
ALBUMIN: 3.9 g/dL (ref 3.5–5.2)
ALT: 28 U/L (ref 0–53)
AST: 36 U/L (ref 0–37)
Alkaline Phosphatase: 60 U/L (ref 39–117)
Anion gap: 14 (ref 5–15)
BILIRUBIN TOTAL: 0.4 mg/dL (ref 0.3–1.2)
BUN: 6 mg/dL (ref 6–23)
CO2: 20 mmol/L (ref 19–32)
Calcium: 9.4 mg/dL (ref 8.4–10.5)
Chloride: 106 mmol/L (ref 96–112)
Creatinine, Ser: 0.8 mg/dL (ref 0.50–1.35)
GFR calc Af Amer: >90 mL/min (ref >90)
GFR calc non Af Amer: >90 mL/min (ref >90)
GLUCOSE: 108 mg/dL — AB (ref 70–99)
POTASSIUM: 3.4 mmol/L — AB (ref 3.5–5.1)
Sodium: 140 mmol/L (ref 135–145)
TOTAL PROTEIN: 7.8 g/dL (ref 6.0–8.3)

## 2015-03-05 LAB — URINALYSIS, ROUTINE W REFLEX MICROSCOPIC
Bilirubin Urine: NEGATIVE
Glucose, UA: NEGATIVE mg/dL
KETONES UR: NEGATIVE mg/dL
Nitrite: NEGATIVE
PROTEIN: 30 mg/dL — AB
Specific Gravity, Urine: 1.005 (ref 1.005–1.030)
Urobilinogen, UA: 0.2 mg/dL (ref 0.0–1.0)
pH: 5.5 (ref 5.0–8.0)

## 2015-03-05 LAB — I-STAT CHEM 8, ED
BUN: 4 mg/dL — ABNORMAL LOW (ref 6–23)
CREATININE: 0.8 mg/dL (ref 0.50–1.35)
Calcium, Ion: 1.13 mmol/L (ref 1.13–1.30)
Chloride: 102 mmol/L (ref 96–112)
Glucose, Bld: 133 mg/dL — ABNORMAL HIGH (ref 70–99)
HCT: 43 % (ref 39.0–52.0)
HEMOGLOBIN: 14.6 g/dL (ref 13.0–17.0)
Potassium: 3.5 mmol/L (ref 3.5–5.1)
SODIUM: 141 mmol/L (ref 135–145)
TCO2: 24 mmol/L (ref 0–100)

## 2015-03-05 LAB — URINE MICROSCOPIC-ADD ON

## 2015-03-05 LAB — CBC WITH DIFFERENTIAL/PLATELET
Basophils Absolute: 0 10*3/uL (ref 0.0–0.1)
Basophils Relative: 0 % (ref 0–1)
Eosinophils Absolute: 0.2 10*3/uL (ref 0.0–0.7)
Eosinophils Relative: 2 % (ref 0–5)
HEMATOCRIT: 41.9 % (ref 39.0–52.0)
HEMOGLOBIN: 13.7 g/dL (ref 13.0–17.0)
LYMPHS ABS: 2.3 10*3/uL (ref 0.7–4.0)
LYMPHS PCT: 24 % (ref 12–46)
MCH: 24.4 pg — ABNORMAL LOW (ref 26.0–34.0)
MCHC: 32.7 g/dL (ref 30.0–36.0)
MCV: 74.7 fL — ABNORMAL LOW (ref 78.0–100.0)
MONO ABS: 1 10*3/uL (ref 0.1–1.0)
MONOS PCT: 10 % (ref 3–12)
NEUTROS PCT: 64 % (ref 43–77)
Neutro Abs: 6.2 10*3/uL (ref 1.7–7.7)
Platelets: 208 10*3/uL (ref 150–400)
RBC: 5.61 MIL/uL (ref 4.22–5.81)
RDW: 15.1 % (ref 11.5–15.5)
WBC: 9.6 10*3/uL (ref 4.0–10.5)

## 2015-03-05 LAB — GC/CHLAMYDIA PROBE AMP (~~LOC~~) NOT AT ARMC
Chlamydia: NEGATIVE
Neisseria Gonorrhea: NEGATIVE

## 2015-03-05 MED ORDER — DEXTROSE 5 % IV SOLN
1.0000 g | Freq: Once | INTRAVENOUS | Status: AC
  Administered 2015-03-05: 1 g via INTRAVENOUS
  Filled 2015-03-05: qty 10

## 2015-03-05 MED ORDER — PHENAZOPYRIDINE HCL 100 MG PO TABS
95.0000 mg | ORAL_TABLET | Freq: Once | ORAL | Status: AC
  Administered 2015-03-05: 100 mg via ORAL
  Filled 2015-03-05: qty 1

## 2015-03-05 MED ORDER — HYDROCODONE-ACETAMINOPHEN 5-325 MG PO TABS: 1.0000 | ORAL_TABLET | Freq: Four times a day (QID) | ORAL | Status: DC | PRN

## 2015-03-05 MED ORDER — HYDROCODONE-ACETAMINOPHEN 5-325 MG PO TABS
1.0000 | ORAL_TABLET | Freq: Once | ORAL | Status: AC
Start: 2015-03-05 — End: 2015-03-05
  Administered 2015-03-05: 1 via ORAL
  Filled 2015-03-05: qty 1

## 2015-03-05 MED ORDER — CIPROFLOXACIN HCL 500 MG PO TABS: 500.0000 mg | ORAL_TABLET | Freq: Two times a day (BID) | ORAL | Status: DC

## 2015-03-05 MED ORDER — PHENAZOPYRIDINE HCL 95 MG PO TABS: 95.0000 mg | ORAL_TABLET | Freq: Three times a day (TID) | ORAL | Status: DC | PRN

## 2015-03-05 NOTE — ED Notes (Signed)
Patient here with complaint of persistent urinary tract infection. States that he has been treated for this infection twice before with abx by Elvina Sidle. States severe pain extending from groin to legs and radiation to lower back and rectum.

## 2015-03-05 NOTE — ED Provider Notes (Signed)
CSN: [id number]     Arrival date & time 03/05/15  0214 History   None    This chart was scribed for Merryl Hacker, MD by Forrestine Him, ED Scribe. This patient was seen in room A05C/A05C and the patient's care was started 2:44 AM.   Chief Complaint  Patient presents with  . Urinary Tract Infection    HPI  HPI Comments: [name] is a 64 y.o. male with a PMHx of HTN, DM, CVS, CHF, TIA who presents to the Emergency Department here for possible urinary tract infection this evening. He reports dysuria, lower abdominal pain described as pressure, along with pain that extends from the groin to the legs bilaterally x 2 days. Pt also reports several episodes of vomiting secondary to pain when attempting to urinate. [name] states he has been treated with antibiotics for UTI twice before within last few weeks. Denies any penile discharge. Denies flank pain. Pt has not followed with Urologist but has been evaluated by his PCP. No known allergies to medications.  Past Medical History  Diagnosis Date  . Hypertension   . Diabetes mellitus   . Brain tumor   . CVA (cerebral vascular accident)   . Narcotic abuse   . CHF (congestive heart failure)   . TIA (transient ischemic attack) 09/06/2012  . Vertigo 09/06/2012  . HTN (hypertension), malignant 09/06/2012  . Tobacco use disorder 12/10/2013  . Alcohol dependence 12/10/2013  . 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 No pertinent past surgical history. Family History  Problem Relation Age of Onset  . CAD Father   . Diabetes Brother   . Diabetes Sister   . Hypertension Father   . Hypertension Mother    History  Substance Use Topics  . Smoking status: Current Every Day Smoker -- 0.50 packs/day    Types: Cigarettes  . Smokeless tobacco: Current User  . Alcohol Use: 0.0 oz/week    0 Standard drinks or equivalent per week     Comment: 6 pack a week    Review of Systems  Constitutional: Negative.  Negative for fever and  chills.  Respiratory: Negative.  Negative for chest tightness and shortness of breath.   Cardiovascular: Negative.  Negative for chest pain.  Gastrointestinal: Positive for nausea, vomiting and abdominal pain. Negative for diarrhea.  Genitourinary: Positive for dysuria and frequency. Negative for flank pain.  Musculoskeletal: Negative for back pain.  Skin: Negative for rash.  Neurological: Negative for headaches.  All other systems reviewed and are negative.     Allergies  Review of patient's allergies indicates no known allergies.  Home Medications   Prior to Admission medications   Medication Sig Start Date End Date Taking? Authorizing Provider  acetaminophen (TYLENOL) 500 MG tablet Take 500 mg by mouth every 6 (six) hours as needed for headache.   Yes Historical Provider, MD  amLODipine (NORVASC) 10 MG tablet Take 10 mg by mouth daily.   Yes Historical Provider, MD  aspirin EC 81 MG tablet Take 81 mg by mouth daily.   Yes Historical Provider, MD  b complex vitamins tablet Take 1 tablet by mouth daily.   Yes Historical Provider, MD  hydrALAZINE (APRESOLINE) 100 MG tablet Take 1 tablet (100 mg total) by mouth every 8 (eight) hours. 01/30/15  Yes Reyne Dumas, MD  isosorbide dinitrate (ISORDIL) 10 MG tablet Take 1 tablet (10 mg total) by mouth 3 (three) times daily. 12/27/13  Yes Debbe Odea, MD  metFORMIN (GLUCOPHAGE) 500 MG  tablet Take 500 mg by mouth 2 (two) times daily with a meal.   Yes Historical Provider, MD  nicotine (NICODERM CQ - DOSED IN MG/24 HOURS) 21 mg/24hr patch Place 1 patch (21 mg total) onto the skin daily. 01/30/15  Yes Reyne Dumas, MD  oxyCODONE-acetaminophen (PERCOCET) 5-325 MG per tablet Take 1 tablet by mouth every 6 (six) hours as needed for severe pain. 02/10/15  Yes Garald Balding, NP  phenazopyridine (PYRIDIUM) 100 MG tablet Take 1 tablet (100 mg total) by mouth 3 (three) times daily with meals. 02/10/15  Yes Garald Balding, NP  pravastatin (PRAVACHOL) 20 MG tablet  Take 20 mg by mouth at bedtime.   Yes Historical Provider, MD  tamsulosin (FLOMAX) 0.4 MG CAPS capsule Take 1 capsule (0.4 mg total) by mouth daily. 01/15/15  Yes Venetia Maxon Rama, MD  traZODone (DESYREL) 100 MG tablet Take 1 tablet (100 mg total) by mouth at bedtime as needed for sleep. 01/15/15  Yes Christina P Rama, MD  cephALEXin (KEFLEX) 500 MG capsule Take 1 capsule (500 mg total) by mouth 4 (four) times daily. Patient not taking: Reported on 03/05/2015 02/10/15   Garald Balding, NP  ciprofloxacin (CIPRO) 500 MG tablet Take 1 tablet (500 mg total) by mouth every 12 (twelve) hours. 03/05/15   Merryl Hacker, MD  HYDROcodone-acetaminophen (NORCO/VICODIN) 5-325 MG per tablet Take 1 tablet by mouth every 6 (six) hours as needed. 03/05/15   Merryl Hacker, MD  phenazopyridine (PYRIDIUM) 95 MG tablet Take 1 tablet (95 mg total) by mouth 3 (three) times daily as needed for pain. 03/05/15   Merryl Hacker, MD   Triage Vitals: BP 170/53 mmHg  Pulse 86  Temp(Src) 98.3 F (36.8 C) (Oral)  Resp 16  SpO2 90%   Physical Exam  Constitutional: He is oriented to person, place, and time. No distress.  HENT:  Head: Normocephalic and atraumatic.  Mouth/Throat: Oropharynx is clear and moist.  Cardiovascular: Normal rate, regular rhythm and normal heart sounds.   No murmur heard. Pulmonary/Chest: Effort normal and breath sounds normal. No respiratory distress. He has no wheezes.  Abdominal: Soft. Bowel sounds are normal. There is tenderness. There is no rebound.  Mild suprapubic tenderness to palpation without rebound or guarding  Musculoskeletal: He exhibits no edema.  Neurological: He is alert and oriented to person, place, and time.  Skin: Skin is warm and dry.  Psychiatric: He has a normal mood and affect.  Nursing note and vitals reviewed.   ED Course  Procedures (including critical care time)  DIAGNOSTIC STUDIES: Oxygen Saturation is 98% on RA, Normal by my interpretation.    COORDINATION  OF CARE: 3:03 AM-Discussed treatment plan with pt at bedside and pt agreed to plan.     Labs Review Labs Reviewed  URINALYSIS, ROUTINE W REFLEX MICROSCOPIC - Abnormal; Notable for the following:    APPearance TURBID (*)    Hgb urine dipstick MODERATE (*)    Protein, ur 30 (*)    Leukocytes, UA LARGE (*)    All other components within normal limits  CBC WITH DIFFERENTIAL/PLATELET - Abnormal; Notable for the following:    MCV 74.7 (*)    MCH 24.4 (*)    All other components within normal limits  COMPREHENSIVE METABOLIC PANEL - Abnormal; Notable for the following:    Potassium 3.4 (*)    Glucose, Bld 108 (*)    All other components within normal limits  URINE MICROSCOPIC-ADD ON - Abnormal; Notable for the  following:    Bacteria, UA FEW (*)    All other components within normal limits  I-STAT CHEM 8, ED - Abnormal; Notable for the following:    BUN 4 (*)    Glucose, Bld 133 (*)    All other components within normal limits  URINE CULTURE  GC/CHLAMYDIA PROBE AMP ([place])    Imaging Review No results found.   EKG Interpretation None      MDM   Final diagnoses:  UTI (lower urinary tract infection)    Patient presents with suprapubic abdominal pain consistent with prior UTIs. Patient has not followed up with urology. Denies any fevers or flank pain. Nontoxic on exam. Basic labwork shows large leuks and too numerous to count white cells in the urine with few bacteria. No leukocytosis and kidney function is within normal limits. Patient was given IV Rocephin and Pyridium.  No urinary cultures in the chart. Urine culture sent. Patient also tested for gonorrhea and chlamydia. Will discharge home with urology follow-up. Patient given a perception for Cipro for 7 days as he is previously been on Keflex. Patient given strict return precautions.  After history, exam, and medical workup I feel the patient has been appropriately medically screened and is safe for discharge home.  Pertinent diagnoses were discussed with the patient. Patient was given return precautions.  I personally performed the services described in this documentation, which was scribed in my presence. The recorded information has been reviewed and is accurate.   Merryl Hacker, MD 03/05/15 [phone]

## 2015-03-05 NOTE — Discharge Instructions (Signed)
You were diagnosed with a urinary tract infection. He'll be given antibiotics and something for bladder spasms. You need to follow-up with urology given your recurrence of urinary tract infections over the last several months. If you develop fever, back pain, or any new or worsening symptoms she should return for further evaluation.  Urinary Tract Infection Urinary tract infections (UTIs) can develop anywhere along your urinary tract. Your urinary tract is your body's drainage system for removing wastes and extra water. Your urinary tract includes two kidneys, two ureters, a bladder, and a urethra. Your kidneys are a pair of bean-shaped organs. Each kidney is about the size of your fist. They are located below your ribs, one on each side of your spine. CAUSES Infections are caused by microbes, which are microscopic organisms, including fungi, viruses, and bacteria. These organisms are so small that they can only be seen through a microscope. Bacteria are the microbes that most commonly cause UTIs. SYMPTOMS  Symptoms of UTIs may vary by age and gender of the patient and by the location of the infection. Symptoms in young women typically include a frequent and intense urge to urinate and a painful, burning feeling in the bladder or urethra during urination. Older women and men are more likely to be tired, shaky, and weak and have muscle aches and abdominal pain. A fever may mean the infection is in your kidneys. Other symptoms of a kidney infection include pain in your back or sides below the ribs, nausea, and vomiting. DIAGNOSIS To diagnose a UTI, your caregiver will ask you about your symptoms. Your caregiver also will ask to provide a urine sample. The urine sample will be tested for bacteria and white blood cells. White blood cells are made by your body to help fight infection. TREATMENT  Typically, UTIs can be treated with medication. Because most UTIs are caused by a bacterial infection, they usually  can be treated with the use of antibiotics. The choice of antibiotic and length of treatment depend on your symptoms and the type of bacteria causing your infection. HOME CARE INSTRUCTIONS  If you were prescribed antibiotics, take them exactly as your caregiver instructs you. Finish the medication even if you feel better after you have only taken some of the medication.  Drink enough water and fluids to keep your urine clear or pale yellow.  Avoid caffeine, tea, and carbonated beverages. They tend to irritate your bladder.  Empty your bladder often. Avoid holding urine for long periods of time.  Empty your bladder before and after sexual intercourse.  After a bowel movement, women should cleanse from front to back. Use each tissue only once. SEEK MEDICAL CARE IF:   You have back pain.  You develop a fever.  Your symptoms do not begin to resolve within 3 days. SEEK IMMEDIATE MEDICAL CARE IF:   You have severe back pain or lower abdominal pain.  You develop chills.  You have nausea or vomiting.  You have continued burning or discomfort with urination. MAKE SURE YOU:   Understand these instructions.  Will watch your condition.  Will get help right away if you are not doing well or get worse. Document Released: 09/24/2005 Document Revised: 06/15/2012 Document Reviewed: 01/23/2012 [facility] [employer] Patient Information 2015 [place], [place]. This information is not intended to replace advice given to you by your health care provider. Make sure you discuss any questions you have with your health care provider.

## 2015-03-07 LAB — URINE CULTURE

## 2015-03-08 ENCOUNTER — Telehealth (HOSPITAL_BASED_OUTPATIENT_CLINIC_OR_DEPARTMENT_OTHER): Payer: Self-pay | Admitting: Emergency Medicine

## 2015-03-08 NOTE — Telephone Encounter (Signed)
Post ED Visit - Positive Culture Follow-up  Culture report reviewed by antimicrobial stewardship pharmacist: []  Wes Dulaney, Pharm.D., BCPS [x]  Heide Guile, Pharm.D., BCPS []  Alycia Rossetti, Pharm.D., BCPS []  Zaleski, Pharm.D., BCPS, AAHIVP []  Legrand Como, Pharm.D., BCPS, AAHIVP []  Isac Sarna, Pharm.D., BCPS  Positive urine culture Enterococcus Treated with ciprofloxacin, organism sensitive to the same and no further patient follow-up is required at this time.  Hazle Nordmann 03/08/2015, 9:59 AM

## 2015-03-18 ENCOUNTER — Emergency Department (HOSPITAL_COMMUNITY): Payer: Medicaid Other

## 2015-03-18 ENCOUNTER — Encounter (HOSPITAL_COMMUNITY): Payer: Self-pay | Admitting: Emergency Medicine

## 2015-03-18 ENCOUNTER — Observation Stay (HOSPITAL_COMMUNITY): Payer: Medicaid Other

## 2015-03-18 ENCOUNTER — Observation Stay (HOSPITAL_COMMUNITY)
Admission: EM | Admit: 2015-03-18 | Discharge: 2015-03-19 | Disposition: A | Discharge status: Home / Self Care | Payer: Medicaid Other | Attending: Internal Medicine | Admitting: Internal Medicine

## 2015-03-18 DIAGNOSIS — F1020 Alcohol dependence, uncomplicated: Secondary | ICD-10-CM | POA: Insufficient documentation

## 2015-03-18 DIAGNOSIS — M549 Dorsalgia, unspecified: Secondary | ICD-10-CM | POA: Insufficient documentation

## 2015-03-18 DIAGNOSIS — R079 Chest pain, unspecified: Secondary | ICD-10-CM | POA: Diagnosis not present

## 2015-03-18 DIAGNOSIS — I5032 Chronic diastolic (congestive) heart failure: Secondary | ICD-10-CM | POA: Diagnosis present

## 2015-03-18 DIAGNOSIS — I10 Essential (primary) hypertension: Secondary | ICD-10-CM | POA: Diagnosis present

## 2015-03-18 DIAGNOSIS — Z7982 Long term (current) use of aspirin: Secondary | ICD-10-CM | POA: Insufficient documentation

## 2015-03-18 DIAGNOSIS — I5033 Acute on chronic diastolic (congestive) heart failure: Secondary | ICD-10-CM | POA: Diagnosis present

## 2015-03-18 DIAGNOSIS — F17210 Nicotine dependence, cigarettes, uncomplicated: Secondary | ICD-10-CM | POA: Insufficient documentation

## 2015-03-18 DIAGNOSIS — B952 Enterococcus as the cause of diseases classified elsewhere: Secondary | ICD-10-CM | POA: Insufficient documentation

## 2015-03-18 DIAGNOSIS — Z79899 Other long term (current) drug therapy: Secondary | ICD-10-CM | POA: Insufficient documentation

## 2015-03-18 DIAGNOSIS — I5030 Unspecified diastolic (congestive) heart failure: Secondary | ICD-10-CM | POA: Insufficient documentation

## 2015-03-18 DIAGNOSIS — N390 Urinary tract infection, site not specified: Secondary | ICD-10-CM | POA: Diagnosis present

## 2015-03-18 DIAGNOSIS — D352 Benign neoplasm of pituitary gland: Secondary | ICD-10-CM | POA: Diagnosis present

## 2015-03-18 DIAGNOSIS — E119 Type 2 diabetes mellitus without complications: Secondary | ICD-10-CM | POA: Diagnosis not present

## 2015-03-18 DIAGNOSIS — G8929 Other chronic pain: Secondary | ICD-10-CM | POA: Diagnosis not present

## 2015-03-18 DIAGNOSIS — N419 Inflammatory disease of prostate, unspecified: Secondary | ICD-10-CM | POA: Diagnosis present

## 2015-03-18 DIAGNOSIS — R1013 Epigastric pain: Secondary | ICD-10-CM

## 2015-03-18 DIAGNOSIS — F1011 Alcohol abuse, in remission: Secondary | ICD-10-CM | POA: Diagnosis present

## 2015-03-18 DIAGNOSIS — Z8673 Personal history of transient ischemic attack (TIA), and cerebral infarction without residual deficits: Secondary | ICD-10-CM | POA: Diagnosis not present

## 2015-03-18 DIAGNOSIS — F1010 Alcohol abuse, uncomplicated: Secondary | ICD-10-CM

## 2015-03-18 HISTORY — DX: Dorsalgia, unspecified: M54.9

## 2015-03-18 HISTORY — DX: Reserved for inherently not codable concepts without codable children: IMO0001

## 2015-03-18 HISTORY — DX: Other chronic pain: G89.29

## 2015-03-18 HISTORY — DX: Headache: R51

## 2015-03-18 HISTORY — DX: Headache, unspecified: R51.9

## 2015-03-18 LAB — CBC WITH DIFFERENTIAL/PLATELET
BASOS ABS: 0.1 10*3/uL (ref 0.0–0.1)
Basophils Relative: 0 % (ref 0–1)
Eosinophils Absolute: 0.5 10*3/uL (ref 0.0–0.7)
Eosinophils Relative: 4 % (ref 0–5)
HCT: 42.4 % (ref 39.0–52.0)
Hemoglobin: 13.8 g/dL (ref 13.0–17.0)
LYMPHS PCT: 32 % (ref 12–46)
Lymphs Abs: 4.5 10*3/uL — ABNORMAL HIGH (ref 0.7–4.0)
MCH: 23.9 pg — ABNORMAL LOW (ref 26.0–34.0)
MCHC: 32.5 g/dL (ref 30.0–36.0)
MCV: 73.4 fL — AB (ref 78.0–100.0)
Monocytes Absolute: 0.8 10*3/uL (ref 0.1–1.0)
Monocytes Relative: 6 % (ref 3–12)
NEUTROS PCT: 58 % (ref 43–77)
Neutro Abs: 8.2 10*3/uL — ABNORMAL HIGH (ref 1.7–7.7)
Platelets: 304 10*3/uL (ref 150–400)
RBC: 5.78 MIL/uL (ref 4.22–5.81)
RDW: 15.6 % — ABNORMAL HIGH (ref 11.5–15.5)
WBC: 14.1 10*3/uL — AB (ref 4.0–10.5)

## 2015-03-18 LAB — URINALYSIS, ROUTINE W REFLEX MICROSCOPIC
Bilirubin Urine: NEGATIVE
Glucose, UA: NEGATIVE mg/dL
HGB URINE DIPSTICK: NEGATIVE
Ketones, ur: NEGATIVE mg/dL
Nitrite: NEGATIVE
PROTEIN: NEGATIVE mg/dL
SPECIFIC GRAVITY, URINE: 1.007 (ref 1.005–1.030)
UROBILINOGEN UA: 0.2 mg/dL (ref 0.0–1.0)
pH: 6 (ref 5.0–8.0)

## 2015-03-18 LAB — URINE MICROSCOPIC-ADD ON

## 2015-03-18 LAB — GLUCOSE, CAPILLARY
Glucose-Capillary: 199 mg/dL — ABNORMAL HIGH (ref 70–99)
Glucose-Capillary: 118 mg/dL — ABNORMAL HIGH (ref 70–99)
Glucose-Capillary: 99 mg/dL (ref 70–99)

## 2015-03-18 LAB — TROPONIN I
Troponin I: <0.03 ng/mL (ref <0.031)
Troponin I: <0.03 ng/mL (ref <0.031)

## 2015-03-18 LAB — CBG MONITORING, ED
GLUCOSE-CAPILLARY: 112 mg/dL — AB (ref 70–99)
Glucose-Capillary: 115 mg/dL — ABNORMAL HIGH (ref 70–99)

## 2015-03-18 LAB — LIPASE, BLOOD: LIPASE: 33 U/L (ref 11–59)

## 2015-03-18 LAB — I-STAT TROPONIN, ED: Troponin i, poc: 0 ng/mL (ref 0.00–0.08)

## 2015-03-18 LAB — I-STAT CHEM 8, ED
BUN: 11 mg/dL (ref 6–23)
CALCIUM ION: 1.08 mmol/L — AB (ref 1.13–1.30)
CHLORIDE: 106 mmol/L (ref 96–112)
CREATININE: 0.7 mg/dL (ref 0.50–1.35)
Glucose, Bld: 105 mg/dL — ABNORMAL HIGH (ref 70–99)
HCT: 41 % (ref 39.0–52.0)
Hemoglobin: 13.9 g/dL (ref 13.0–17.0)
Potassium: 5.9 mmol/L — ABNORMAL HIGH (ref 3.5–5.1)
Sodium: 138 mmol/L (ref 135–145)
TCO2: 22 mmol/L (ref 0–100)

## 2015-03-18 LAB — ETHANOL

## 2015-03-18 LAB — RAPID URINE DRUG SCREEN, HOSP PERFORMED
Amphetamines: NOT DETECTED
BARBITURATES: NOT DETECTED
Benzodiazepines: NOT DETECTED
Cocaine: NOT DETECTED
OPIATES: NOT DETECTED
Tetrahydrocannabinol: NOT DETECTED

## 2015-03-18 LAB — POTASSIUM: Potassium: 4 mmol/L (ref 3.5–5.1)

## 2015-03-18 LAB — D-DIMER, QUANTITATIVE: D-Dimer, Quant: 0.44 ug/mL-FEU (ref 0.00–0.48)

## 2015-03-18 LAB — BRAIN NATRIURETIC PEPTIDE: B Natriuretic Peptide: 31.2 pg/mL (ref 0.0–100.0)

## 2015-03-18 MED ORDER — ASPIRIN EC 81 MG PO TBEC
81.0000 mg | DELAYED_RELEASE_TABLET | Freq: Every day | ORAL | Status: DC
  Administered 2015-03-18 – 2015-03-19 (×2): 81 mg via ORAL
  Filled 2015-03-18 (×2): qty 1

## 2015-03-18 MED ORDER — TRAZODONE HCL 100 MG PO TABS
100.0000 mg | ORAL_TABLET | Freq: Every evening | ORAL | Status: DC | PRN
  Filled 2015-03-18: qty 1

## 2015-03-18 MED ORDER — SODIUM CHLORIDE 0.9 % IV SOLN
INTRAVENOUS | Status: AC
  Administered 2015-03-18: 12:00:00 via INTRAVENOUS

## 2015-03-18 MED ORDER — LORAZEPAM 1 MG PO TABS: 1.0000 mg | ORAL_TABLET | Freq: Four times a day (QID) | ORAL | Status: DC | PRN

## 2015-03-18 MED ORDER — B COMPLEX-C PO TABS
1.0000 | ORAL_TABLET | Freq: Every day | ORAL | Status: DC
  Administered 2015-03-19: 1 via ORAL
  Filled 2015-03-18: qty 1

## 2015-03-18 MED ORDER — THIAMINE HCL 100 MG/ML IJ SOLN
100.0000 mg | Freq: Every day | INTRAMUSCULAR | Status: DC
  Filled 2015-03-18: qty 1

## 2015-03-18 MED ORDER — HYDRALAZINE HCL 50 MG PO TABS
100.0000 mg | ORAL_TABLET | Freq: Three times a day (TID) | ORAL | Status: DC
  Administered 2015-03-18 – 2015-03-19 (×3): 100 mg via ORAL
  Filled 2015-03-18 (×7): qty 2

## 2015-03-18 MED ORDER — DEXTROSE 5 % IV SOLN
1.0000 g | Freq: Once | INTRAVENOUS | Status: AC
  Administered 2015-03-18: 1 g via INTRAVENOUS
  Filled 2015-03-18: qty 10

## 2015-03-18 MED ORDER — LORAZEPAM 2 MG/ML IJ SOLN: 1.0000 mg | Freq: Four times a day (QID) | INTRAMUSCULAR | Status: DC | PRN

## 2015-03-18 MED ORDER — ACETAMINOPHEN 325 MG PO TABS: 650.0000 mg | ORAL_TABLET | ORAL | Status: DC | PRN

## 2015-03-18 MED ORDER — ASPIRIN 81 MG PO CHEW
324.0000 mg | CHEWABLE_TABLET | Freq: Once | ORAL | Status: AC
  Administered 2015-03-18: 324 mg via ORAL
  Filled 2015-03-18: qty 4

## 2015-03-18 MED ORDER — ONDANSETRON HCL 4 MG/2ML IJ SOLN
4.0000 mg | Freq: Four times a day (QID) | INTRAMUSCULAR | Status: DC | PRN
  Administered 2015-03-18: 4 mg via INTRAVENOUS
  Filled 2015-03-18: qty 2

## 2015-03-18 MED ORDER — NICOTINE 21 MG/24HR TD PT24
21.0000 mg | MEDICATED_PATCH | Freq: Every day | TRANSDERMAL | Status: DC
  Administered 2015-03-18 – 2015-03-19 (×2): 21 mg via TRANSDERMAL
  Filled 2015-03-18 (×2): qty 1

## 2015-03-18 MED ORDER — FOLIC ACID 1 MG PO TABS
1.0000 mg | ORAL_TABLET | Freq: Every day | ORAL | Status: DC
  Administered 2015-03-18 – 2015-03-19 (×2): 1 mg via ORAL
  Filled 2015-03-18 (×2): qty 1

## 2015-03-18 MED ORDER — B COMPLEX PO TABS: 1.0000 | ORAL_TABLET | Freq: Every day | ORAL | Status: DC

## 2015-03-18 MED ORDER — VITAMIN B-1 100 MG PO TABS
100.0000 mg | ORAL_TABLET | Freq: Every day | ORAL | Status: DC
  Administered 2015-03-18 – 2015-03-19 (×2): 100 mg via ORAL
  Filled 2015-03-18 (×2): qty 1

## 2015-03-18 MED ORDER — ENOXAPARIN SODIUM 40 MG/0.4ML ~~LOC~~ SOLN
40.0000 mg | SUBCUTANEOUS | Status: DC
  Administered 2015-03-18: 40 mg via SUBCUTANEOUS
  Filled 2015-03-18 (×2): qty 0.4

## 2015-03-18 MED ORDER — SODIUM CHLORIDE 0.9 % IV SOLN
3.0000 g | Freq: Three times a day (TID) | INTRAVENOUS | Status: DC
  Administered 2015-03-18 – 2015-03-19 (×3): 3 g via INTRAVENOUS
  Filled 2015-03-18 (×5): qty 3

## 2015-03-18 MED ORDER — ACETAMINOPHEN 500 MG PO TABS
500.0000 mg | ORAL_TABLET | Freq: Four times a day (QID) | ORAL | Status: DC | PRN
  Administered 2015-03-18: 500 mg via ORAL
  Filled 2015-03-18: qty 1

## 2015-03-18 MED ORDER — INSULIN ASPART 100 UNIT/ML ~~LOC~~ SOLN: 0.0000 [IU] | SUBCUTANEOUS | Status: DC

## 2015-03-18 MED ORDER — ISOSORBIDE DINITRATE 10 MG PO TABS
10.0000 mg | ORAL_TABLET | Freq: Three times a day (TID) | ORAL | Status: DC
  Administered 2015-03-18 – 2015-03-19 (×3): 10 mg via ORAL
  Filled 2015-03-18 (×7): qty 1

## 2015-03-18 MED ORDER — ADULT MULTIVITAMIN W/MINERALS CH
1.0000 | ORAL_TABLET | Freq: Every day | ORAL | Status: DC
  Administered 2015-03-18 – 2015-03-19 (×2): 1 via ORAL
  Filled 2015-03-18 (×2): qty 1

## 2015-03-18 MED ORDER — TAMSULOSIN HCL 0.4 MG PO CAPS
0.4000 mg | ORAL_CAPSULE | Freq: Every day | ORAL | Status: DC
  Administered 2015-03-18 – 2015-03-19 (×2): 0.4 mg via ORAL
  Filled 2015-03-18 (×2): qty 1

## 2015-03-18 MED ORDER — GI COCKTAIL ~~LOC~~
30.0000 mL | Freq: Four times a day (QID) | ORAL | Status: DC | PRN
  Filled 2015-03-18: qty 30

## 2015-03-18 MED ORDER — OXYCODONE-ACETAMINOPHEN 5-325 MG PO TABS
1.0000 | ORAL_TABLET | Freq: Once | ORAL | Status: AC
  Administered 2015-03-18: 1 via ORAL
  Filled 2015-03-18: qty 1

## 2015-03-18 MED ORDER — PHENAZOPYRIDINE HCL 100 MG PO TABS
95.0000 mg | ORAL_TABLET | Freq: Three times a day (TID) | ORAL | Status: DC
  Administered 2015-03-18: 100 mg via ORAL
  Filled 2015-03-18 (×3): qty 1

## 2015-03-18 MED ORDER — PRAVASTATIN SODIUM 20 MG PO TABS
20.0000 mg | ORAL_TABLET | Freq: Every day | ORAL | Status: DC
  Administered 2015-03-18: 20 mg via ORAL
  Filled 2015-03-18 (×2): qty 1

## 2015-03-18 MED ORDER — SODIUM CHLORIDE 0.9 % IV SOLN
INTRAVENOUS | Status: DC
  Administered 2015-03-18: 10:00:00 via INTRAVENOUS

## 2015-03-18 MED ORDER — AMLODIPINE BESYLATE 10 MG PO TABS
10.0000 mg | ORAL_TABLET | Freq: Every day | ORAL | Status: DC
  Administered 2015-03-18 – 2015-03-19 (×2): 10 mg via ORAL
  Filled 2015-03-18: qty 1
  Filled 2015-03-18: qty 2

## 2015-03-18 MED ORDER — HYDROCODONE-ACETAMINOPHEN 5-325 MG PO TABS: 1.0000 | ORAL_TABLET | Freq: Four times a day (QID) | ORAL | Status: DC | PRN

## 2015-03-18 NOTE — ED Provider Notes (Signed)
CSN: [id number]     Arrival date & time 03/18/15  0729 History   First MD Initiated Contact with Patient 03/18/15 [phone]     Chief Complaint  Patient presents with  . Chest Pain      HPI Pt was seen at 0745. Per pt, c/o gradual onset and persistence of constant left sided chest "pain" that began sometime this morning. Pt describes the pain as "sharp," and located in his left chest area. Pt does not know when the CP started, just "when I woke up" and "I think the sun was up." Pt cannot further describe his CP. States he is "SOB too." Denies injury, no palpitations, no cough, no abd pain, no N/V/D, no back pain, no rash, no fevers. The symptoms have been associated with no other complaints. The patient has a significant history of similar symptoms previously, recently being evaluated for this complaint and multiple prior evals for same.  Pt was admitted 11/2013 with CP with negative serial enzymes. Pt did not f/u with Cardiologist as outpatient for stress test.      Past Medical History  Diagnosis Date  . Hypertension   . Diabetes mellitus   . Brain tumor   . CVA (cerebral vascular accident)   . Narcotic abuse   . TIA (transient ischemic attack) 09/06/2012  . Vertigo 09/06/2012  . HTN (hypertension), malignant 09/06/2012  . Tobacco use disorder 12/10/2013  . Alcohol dependence 12/10/2013  . Angioedema of lips 12/24/2013  . Chronic headache   . Chronic back pain   . CHF (congestive heart failure)     diastolic  . Normal cardiac stress test 2009   History reviewed. No pertinent past surgical history.   Family History  Problem Relation Age of Onset  . CAD Father     "heart attack in his 88's"  . Hypertension Father   . Diabetes Brother   . Diabetes Sister   . Hypertension Mother     History   Social History Main Topics  . Smoking status: Current Every Day Smoker -- 0.50 packs/day    Types: Cigarettes  . Smokeless tobacco: Current User  . Alcohol Use: 0.0 oz/week    0 Standard  drinks or equivalent per week     Comment: 6 pack a week  . Drug Use: Yes    Special: Marijuana, Cocaine, IV     Comment: Heroin - one month ago     Review of Systems ROS: Statement: All systems negative except as marked or noted in the HPI; Constitutional: Negative for fever and chills. ; ; Eyes: Negative for eye pain, redness and discharge. ; ; ENMT: Negative for ear pain, hoarseness, nasal congestion, sinus pressure and sore throat. ; ; Cardiovascular: +CP, SOB. Negative for palpitations, diaphoresis, and peripheral edema. ; ; Respiratory: Negative for cough, wheezing and stridor. ; ; Gastrointestinal: Negative for nausea, vomiting, diarrhea, abdominal pain, blood in stool, hematemesis, jaundice and rectal bleeding. . ; ; Genitourinary: Negative for dysuria, flank pain and hematuria. ; ; Musculoskeletal:  Negative for back pain and neck pain. Negative for swelling and trauma.; ; Skin: Negative for pruritus, rash, abrasions, blisters, bruising and skin lesion.; ; Neuro: Negative for headache, lightheadedness and neck stiffness. Negative for weakness, altered level of consciousness , altered mental status, extremity weakness, paresthesias, involuntary movement, seizure and syncope.      Allergies  Review of patient's allergies indicates no known allergies.  Home Medications   Prior to Admission medications   Medication Sig Start  Date End Date Taking? Authorizing Provider  acetaminophen (TYLENOL) 500 MG tablet Take 500 mg by mouth every 6 (six) hours as needed for headache.   Yes Historical Provider, MD  amLODipine (NORVASC) 10 MG tablet Take 10 mg by mouth daily.   Yes Historical Provider, MD  aspirin EC 81 MG tablet Take 81 mg by mouth daily.   Yes Historical Provider, MD  b complex vitamins tablet Take 1 tablet by mouth daily.   Yes Historical Provider, MD  hydrALAZINE (APRESOLINE) 100 MG tablet Take 1 tablet (100 mg total) by mouth every 8 (eight) hours. 01/30/15  Yes Reyne Dumas, MD   isosorbide dinitrate (ISORDIL) 10 MG tablet Take 1 tablet (10 mg total) by mouth 3 (three) times daily. 12/27/13  Yes Debbe Odea, MD  metFORMIN (GLUCOPHAGE) 500 MG tablet Take 500 mg by mouth 2 (two) times daily with a meal.   Yes Historical Provider, MD  nicotine (NICODERM CQ - DOSED IN MG/24 HOURS) 21 mg/24hr patch Place 1 patch (21 mg total) onto the skin daily. 01/30/15  Yes Reyne Dumas, MD  pravastatin (PRAVACHOL) 20 MG tablet Take 20 mg by mouth at bedtime.   Yes Historical Provider, MD  tamsulosin (FLOMAX) 0.4 MG CAPS capsule Take 1 capsule (0.4 mg total) by mouth daily. 01/15/15  Yes Venetia Maxon Rama, MD  traZODone (DESYREL) 100 MG tablet Take 1 tablet (100 mg total) by mouth at bedtime as needed for sleep. 01/15/15  Yes Christina P Rama, MD   BP 127/40 mmHg  Pulse 98  Temp(Src) 98.2 F (36.8 C) (Oral)  Resp 16  Ht 5\' 10"  (1.778 m)  Wt 167 lb (75.751 kg)  BMI 23.96 kg/m2  SpO2 96% Physical Exam  0750: Physical examination:  Nursing notes reviewed; Vital signs and O2 SAT reviewed;  Constitutional: Well developed, Well nourished, Well hydrated, In no acute distress; Head:  Normocephalic, atraumatic; Eyes: EOMI, PERRL, No scleral icterus; ENMT: Mouth and pharynx normal, Mucous membranes moist; Neck: Supple, Full range of motion, No lymphadenopathy; Cardiovascular: Regular rate and rhythm, No gallop; Respiratory: Breath sounds clear & equal bilaterally, No wheezes.  Speaking full sentences with ease, Normal respiratory effort/excursion; Chest: +left upper anterior chest wall tender to palp. No soft tissue crepitus, no deformity. Movement normal; Abdomen: Soft, Nontender, Nondistended, Normal bowel sounds; Genitourinary: No CVA tenderness; Extremities: Pulses normal, No tenderness, No edema, No calf edema or asymmetry.; Neuro: AA&Ox3, vague historian. Major CN grossly intact.  Speech clear. No gross focal motor or sensory deficits in extremities.; Skin: Color normal, Warm, Dry.   ED Course   Procedures     EKG Interpretation   Date/Time:  Sunday March 18 2015 07:34:13 EDT Ventricular Rate:  112 PR Interval:  154 QRS Duration: 82 QT Interval:  360 QTC Calculation: 491 R Axis:   51 Text Interpretation:  Sinus tachycardia Artifact When compared with ECG of  01/26/2015 Rate faster Confirmed by [facility]  MD, Nunzio Cory [phone]) on  03/18/2015 7:58:07 AM      MDM  MDM Reviewed: previous chart, nursing note and vitals Reviewed previous: labs and ECG Interpretation: labs, ECG and x-ray   Results for orders placed or performed during the hospital encounter of 03/18/15  Ethanol  Result Value Ref Range   Alcohol, Ethyl (B) <5 0 - 9 mg/dL  Urine rapid drug screen (hosp performed)  Result Value Ref Range   Opiates NONE DETECTED NONE DETECTED   Cocaine NONE DETECTED NONE DETECTED   Benzodiazepines NONE DETECTED NONE DETECTED  Amphetamines NONE DETECTED NONE DETECTED   Tetrahydrocannabinol NONE DETECTED NONE DETECTED   Barbiturates NONE DETECTED NONE DETECTED  CBC with Differential  Result Value Ref Range   WBC 14.1 (H) 4.0 - 10.5 K/uL   RBC 5.78 4.22 - 5.81 MIL/uL   Hemoglobin 13.8 13.0 - 17.0 g/dL   HCT 42.4 39.0 - 52.0 %   MCV 73.4 (L) 78.0 - 100.0 fL   MCH 23.9 (L) 26.0 - 34.0 pg   MCHC 32.5 30.0 - 36.0 g/dL   RDW 15.6 (H) 11.5 - 15.5 %   Platelets 304 150 - 400 K/uL   Neutrophils Relative % 58 43 - 77 %   Neutro Abs 8.2 (H) 1.7 - 7.7 K/uL   Lymphocytes Relative 32 12 - 46 %   Lymphs Abs 4.5 (H) 0.7 - 4.0 K/uL   Monocytes Relative 6 3 - 12 %   Monocytes Absolute 0.8 0.1 - 1.0 K/uL   Eosinophils Relative 4 0 - 5 %   Eosinophils Absolute 0.5 0.0 - 0.7 K/uL   Basophils Relative 0 0 - 1 %   Basophils Absolute 0.1 0.0 - 0.1 K/uL  Urinalysis, Routine w reflex microscopic  Result Value Ref Range   Color, Urine YELLOW YELLOW   APPearance CLEAR CLEAR   Specific Gravity, Urine 1.007 1.005 - 1.030   pH 6.0 5.0 - 8.0   Glucose, UA NEGATIVE NEGATIVE mg/dL   Hgb  urine dipstick NEGATIVE NEGATIVE   Bilirubin Urine NEGATIVE NEGATIVE   Ketones, ur NEGATIVE NEGATIVE mg/dL   Protein, ur NEGATIVE NEGATIVE mg/dL   Urobilinogen, UA 0.2 0.0 - 1.0 mg/dL   Nitrite NEGATIVE NEGATIVE   Leukocytes, UA MODERATE (A) NEGATIVE  D-dimer, quantitative  Result Value Ref Range   D-Dimer, Quant 0.44 0.00 - 0.48 ug/mL-FEU  Brain natriuretic peptide  Result Value Ref Range   B Natriuretic Peptide 31.2 0.0 - 100.0 pg/mL  Potassium  Result Value Ref Range   Potassium 4.0 3.5 - 5.1 mmol/L  Urine microscopic-add on  Result Value Ref Range   Squamous Epithelial / LPF FEW (A) RARE   WBC, UA 21-50 <3 WBC/hpf   RBC / HPF 0-2 <3 RBC/hpf   Bacteria, UA FEW (A) RARE  I-stat Chem 8, ED  Result Value Ref Range   Sodium 138 135 - 145 mmol/L   Potassium 5.9 (H) 3.5 - 5.1 mmol/L   Chloride 106 96 - 112 mmol/L   BUN 11 6 - 23 mg/dL   Creatinine, Ser 0.70 0.50 - 1.35 mg/dL   Glucose, Bld 105 (H) 70 - 99 mg/dL   Calcium, Ion 1.08 (L) 1.13 - 1.30 mmol/L   TCO2 22 0 - 100 mmol/L   Hemoglobin 13.9 13.0 - 17.0 g/dL   HCT 41.0 39.0 - 52.0 %  I-stat troponin, ED  Result Value Ref Range   Troponin i, poc 0.00 0.00 - 0.08 ng/mL   Comment 3          CBG monitoring, ED  Result Value Ref Range   Glucose-Capillary 115 (H) 70 - 99 mg/dL   Dg Chest 2 View 03/18/2015   CLINICAL DATA:  Sharp chest pain  EXAM: CHEST  2 VIEW  COMPARISON:  01/26/2015  FINDINGS: The lungs are well aerated bilaterally. Bilateral nipple shadows are seen. No focal infiltrate or sizable effusion is seen. No bony abnormality is noted.  IMPRESSION: No active cardiopulmonary disease.   Electronically Signed   By: Inez Catalina M.D.   On: 03/18/2015 08:25  0800:  Vague historian to both ED RN x2 and I. Pt will not answer questions regarding his symptoms in any detail, just keeps stating his "chest hurts."  ASA given. Will also tx for pain. Pt with multiple ACS risk factors and non-compliant with Cards f/u as  recommended during previous ED and inpatient visits. Workup in progress, will observation admit.   0935:  Repeat potassium level pending. No peaked T-waves on EKG. CP improved after meds. Pt has had multiple ED visits for CP and pt has been non-compliant with Cards f/u. Given pt's multiple ACS risk factors, he will need provacative cardiac testing. T/C to [facility] Lissa Merlin, case discussed, including:  HPI, pertinent PM/SHx, VS/PE, dx testing, ED course and treatment:  Agreeable to admit, requests to write temporary orders, obtain observation tele bed to team MCAdmits.  1030:  Potassium normal on repeat. +UTI, UC pending; will dose IV rocephin.     Francine Graven, DO 03/21/15 1348

## 2015-03-18 NOTE — Progress Notes (Signed)
ANTIBIOTIC CONSULT NOTE - INITIAL  Pharmacy Consult for unasyn Indication: enterococcus UTI with suspected underlying prostatis  No Known Allergies  Patient Measurements: Height: 5\' 10"  (177.8 cm) Weight: 167 lb (75.751 kg) IBW/kg (Calculated) : 73   Vital Signs: Temp: 98.2 F (36.8 C) (03/20 0739) Temp Source: Oral (03/20 0739) BP: 127/40 mmHg (03/20 0953) Pulse Rate: 98 (03/20 0953) Intake/Output from previous day:   Intake/Output from this shift:    Labs:  Recent Labs  03/18/15 0754 03/18/15 0827  WBC 14.1*  --   HGB 13.8 13.9  PLT 304  --   CREATININE  --  0.70   Estimated Creatinine Clearance: 98.9 mL/min (by C-G formula based on Cr of 0.7). No results for input(s): VANCOTROUGH, VANCOPEAK, VANCORANDOM, GENTTROUGH, GENTPEAK, GENTRANDOM, TOBRATROUGH, TOBRAPEAK, TOBRARND, AMIKACINPEAK, AMIKACINTROU, AMIKACIN in the last 72 hours.   Microbiology: Recent Results (from the past 720 hour(s))  Urine culture     Status: None   Collection Time: 03/05/15  3:50 AM  Result Value Ref Range Status   Specimen Description URINE, CLEAN CATCH  Final   Special Requests NONE  Final   Colony Count   Final    55,000 COLONIES/ML Performed at [facility]    Culture   Final    ENTEROCOCCUS SPECIES Performed at [facility]    Report Status 03/07/2015 FINAL  Final   Organism ID, Bacteria ENTEROCOCCUS SPECIES  Final      Susceptibility   Enterococcus species - MIC*    AMPICILLIN <=2 SENSITIVE Sensitive     LEVOFLOXACIN 1 SENSITIVE Sensitive     NITROFURANTOIN <=16 SENSITIVE Sensitive     VANCOMYCIN 1 SENSITIVE Sensitive     TETRACYCLINE >=16 RESISTANT Resistant     * ENTEROCOCCUS SPECIES    Medical History: Past Medical History  Diagnosis Date  . Hypertension   . Diabetes mellitus   . Brain tumor   . CVA (cerebral vascular accident)   . Narcotic abuse   . TIA (transient ischemic attack) 09/06/2012  . Vertigo 09/06/2012  . HTN (hypertension), malignant  09/06/2012  . Tobacco use disorder 12/10/2013  . Alcohol dependence 12/10/2013  . Angioedema of lips 12/24/2013  . Chronic headache   . Chronic back pain   . CHF (congestive heart failure)     diastolic  . Normal cardiac stress test 2009  Assessment: 64 yo M in ED with CC of chest pain.  Pharmacy consulted to dose Unasyn for enterococcus UTI and suspected underlying prostatitis. WBC 14.1, creat 0.7, AF, Wt 75.8 kg.  Was started on cipro as an outpt per MD notes.   03/05/15 Ucx>> 55K enterococcus sens to all X TCN;  3/20 Ucx>>  unasyn 3/20>> Rocephin 3/7 x 1 in [facility] ED  Goal of Therapy:  Eradicate infection  Plan:  -unasyn 3 gm IV q8h  Eudelia Bunch, Pharm.D. [phone] 03/18/2015 10:23 AM

## 2015-03-18 NOTE — ED Notes (Signed)
Hospitalist at the bedside 

## 2015-03-18 NOTE — H&P (Signed)
[facility] History and Physical                                                                                    [name], is a 64 y.o. male  MRN: [id number]   DOB - February 21, 1951  Admit Date - 03/18/2015  Outpatient Primary MD for the patient is Philis Fendt, MD  With History of -  Past Medical History  Diagnosis Date  . Hypertension   . Diabetes mellitus   . Brain tumor   . CVA (cerebral vascular accident)   . Narcotic abuse   . TIA (transient ischemic attack) 09/06/2012  . Vertigo 09/06/2012  . HTN (hypertension), malignant 09/06/2012  . Tobacco use disorder 12/10/2013  . Alcohol dependence 12/10/2013  . Angioedema of lips 12/24/2013  . Chronic headache   . Chronic back pain   . CHF (congestive heart failure)     diastolic  . Normal cardiac stress test 2009      History reviewed. No pertinent past surgical history.  in for   Chief Complaint  Patient presents with  . Chest Pain     HPI 64 year old male patient with documented history of alcohol abuse (confirmed by ER physician although patient currently denying), hypertension, diabetes, recurrent UTI and prior prostatitis, chronic pain syndrome, and benign pituitary adenoma. Patient presents to the ER with complaints of left-sided chest pain that radiates to the left arm. He is a very poor historian and I actually have to pull a significant amount of history out of this gentleman. I also had to utilize open-ended questions otherwise he answers yes to everything. Basically he has been having left anterior chest pain that radiates to the arm that is sharp and can be reproduced (per his report) with movement of the left arm. There has been some shortness of breath with this. He initially told the emergency room physician he was not having any abdominal pain or nausea and vomiting but then reported to this examiner that he had some nausea and "harked up" some blood this morning. He also reporting that his left eye is  hurting. He is unsure exactly when the chest pain started but reports that it began sometime this morning since the sunrise. Edition he was reporting shortness of breath but denied dyspnea on exertion.  In the ER his EKG was normal without any ischemic changes, troponin was normal at 0.00. Electrolyte panel was normal except for elevated potassium which likely was hemolyzed since repeat potassium was 4.0. He had leukocytosis and white count of 14,100. His MCV is low at 73.4. His hemoglobin was mildly elevated for him at 13.9. He dimer was normal at 0.44. Alcohol less than 5. Patiently recently seen in the ER on 3/6 and was found to have a urinary tract infection and likely underlying prostatitis. Culture was 55,000 colonies of enterococcus pansensitive; he was taking Cipro 500 mg by mouth twice a day prior to admission.  Review of Systems   In addition to the HPI above,  No Fever-chills, myalgias or other constitutional symptoms No Headache, changes with Vision or hearing, new weakness, tingling, numbness in any extremity, No problems swallowing food or  Liquids, indigestion/reflux No Cough, palpitations, orthopnea or DOE No Abdominal pain, N/V; no melena or hematochezia, no dark tarry stools, Bowel movements are regular, No new skin rashes, lesions, masses or bruises, No new joints pains-aches No recent weight gain or loss No polyuria, polydypsia or polyphagia,  *A full 10 point Review of Systems was done, except as stated above, all other Review of Systems were negative.  Social History History  Substance Use Topics  . Smoking status: Current Every Day Smoker -- 0.50 packs/day    Types: Cigarettes  . Smokeless tobacco: Current User  . Alcohol Use: 0.0 oz/week    0 Standard drinks or equivalent per week     Comment: Previously documented as up to a 6 pack a week    Family History Family History  Problem Relation Age of Onset  . CAD Father     "heart attack in his 95's"  .  Hypertension Father   . Diabetes Brother   . Diabetes Sister   . Hypertension Mother     Prior to Admission medications   Medication Sig Start Date End Date Taking? Authorizing Provider  acetaminophen (TYLENOL) 500 MG tablet Take 500 mg by mouth every 6 (six) hours as needed for headache.    Historical Provider, MD  amLODipine (NORVASC) 10 MG tablet Take 10 mg by mouth daily.    Historical Provider, MD  aspirin EC 81 MG tablet Take 81 mg by mouth daily.    Historical Provider, MD  b complex vitamins tablet Take 1 tablet by mouth daily.    Historical Provider, MD  cephALEXin (KEFLEX) 500 MG capsule Take 1 capsule (500 mg total) by mouth 4 (four) times daily. Patient not taking: Reported on 03/05/2015 02/10/15   Junius Creamer, NP  ciprofloxacin (CIPRO) 500 MG tablet Take 1 tablet (500 mg total) by mouth every 12 (twelve) hours. 03/05/15   Merryl Hacker, MD  hydrALAZINE (APRESOLINE) 100 MG tablet Take 1 tablet (100 mg total) by mouth every 8 (eight) hours. 01/30/15   Reyne Dumas, MD  HYDROcodone-acetaminophen (NORCO/VICODIN) 5-325 MG per tablet Take 1 tablet by mouth every 6 (six) hours as needed. 03/05/15   Merryl Hacker, MD  isosorbide dinitrate (ISORDIL) 10 MG tablet Take 1 tablet (10 mg total) by mouth 3 (three) times daily. 12/27/13   Debbe Odea, MD  metFORMIN (GLUCOPHAGE) 500 MG tablet Take 500 mg by mouth 2 (two) times daily with a meal.    Historical Provider, MD  nicotine (NICODERM CQ - DOSED IN MG/24 HOURS) 21 mg/24hr patch Place 1 patch (21 mg total) onto the skin daily. 01/30/15   Reyne Dumas, MD  oxyCODONE-acetaminophen (PERCOCET) 5-325 MG per tablet Take 1 tablet by mouth every 6 (six) hours as needed for severe pain. 02/10/15   Junius Creamer, NP  phenazopyridine (PYRIDIUM) 100 MG tablet Take 1 tablet (100 mg total) by mouth 3 (three) times daily with meals. 02/10/15   Junius Creamer, NP  phenazopyridine (PYRIDIUM) 95 MG tablet Take 1 tablet (95 mg total) by mouth 3 (three) times daily as  needed for pain. 03/05/15   Merryl Hacker, MD  pravastatin (PRAVACHOL) 20 MG tablet Take 20 mg by mouth at bedtime.    Historical Provider, MD  tamsulosin (FLOMAX) 0.4 MG CAPS capsule Take 1 capsule (0.4 mg total) by mouth daily. 01/15/15   Venetia Maxon Rama, MD  traZODone (DESYREL) 100 MG tablet Take 1 tablet (100 mg total) by mouth at bedtime as needed for sleep. 01/15/15  Venetia Maxon Rama, MD    No Known Allergies  Physical Exam  Vitals  Blood pressure 127/40, pulse 98, temperature 98.2 F (36.8 C), temperature source Oral, resp. rate 16, height 5\' 10"  (1.778 m), weight 167 lb (75.751 kg), SpO2 96 %.   General:  In no acute distress, appears healthy and well nourished  Psych:  Normal affect, Denies Suicidal or Homicidal ideations, Awake Alert, Oriented X 3. Speech and thought patterns are clear and appropriate, no apparent short term memory deficits  Neuro:   No focal neurological deficits, CN II through XII intact, Strength 5/5 all 4 extremities, Sensation intact all 4 extremities.  ENT:  Ears and Eyes appear Normal, Conjunctivae clear, PER. Moist oral mucosa without erythema or exudates.  Neck:  Supple, No lymphadenopathy appreciated  Respiratory:  Symmetrical chest wall movement, Good air movement bilaterally, CTAB. Room Air  Cardiac:  RRR, No Murmurs, no LE edema noted, no JVD, No carotid bruits, peripheral pulses palpable at 2+  Abdomen:  Positive bowel sounds, Soft, tender upper abdomen only, Non distended,  No masses appreciated, no obvious hepatosplenomegaly  Skin:  No Cyanosis, Normal Skin Turgor, No Skin Rash or Bruise.  Extremities: Symmetrical without obvious trauma or injury,  no effusions.  Data Review  CBC  Recent Labs Lab 03/18/15 0754 03/18/15 0827  WBC 14.1*  --   HGB 13.8 13.9  HCT 42.4 41.0  PLT 304  --   MCV 73.4*  --   MCH 23.9*  --   MCHC 32.5  --   RDW 15.6*  --   LYMPHSABS 4.5*  --   MONOABS 0.8  --   EOSABS 0.5  --   BASOSABS 0.1  --      Chemistries   Recent Labs Lab 03/18/15 0827  NA 138  K 5.9*  CL 106  GLUCOSE 105*  BUN 11  CREATININE 0.70    estimated creatinine clearance is 98.9 mL/min (by C-G formula based on Cr of 0.7).  No results for input(s): TSH, T4TOTAL, T3FREE, THYROIDAB in the last 72 hours.  Invalid input(s): FREET3  Coagulation profile No results for input(s): INR, PROTIME in the last 168 hours.   Recent Labs  03/18/15 0754  DDIMER 0.44    Cardiac Enzymes No results for input(s): CKMB, TROPONINI, MYOGLOBIN in the last 168 hours.  Invalid input(s): CK  Invalid input(s): POCBNP  Urinalysis    Component Value Date/Time   COLORURINE YELLOW 03/05/2015 0349   APPEARANCEUR TURBID* 03/05/2015 0349   LABSPEC 1.005 03/05/2015 0349   PHURINE 5.5 03/05/2015 0349   GLUCOSEU NEGATIVE 03/05/2015 0349   HGBUR MODERATE* 03/05/2015 0349   BILIRUBINUR NEGATIVE 03/05/2015 0349   KETONESUR NEGATIVE 03/05/2015 0349   PROTEINUR 30* 03/05/2015 0349   UROBILINOGEN 0.2 03/05/2015 0349   NITRITE NEGATIVE 03/05/2015 0349   LEUKOCYTESUR LARGE* 03/05/2015 0349    Imaging results:   Dg Chest 2 View  03/18/2015   CLINICAL DATA:  Sharp chest pain  EXAM: CHEST  2 VIEW  COMPARISON:  01/26/2015  FINDINGS: The lungs are well aerated bilaterally. Bilateral nipple shadows are seen. No focal infiltrate or sizable effusion is seen. No bony abnormality is noted.  IMPRESSION: No active cardiopulmonary disease.   Electronically Signed   By: Inez Catalina M.D.   On: 03/18/2015 08:25     EKG: Sinus tachycardia without any acute ST-T wave changes, borderline QTC at 491 ms   Assessment & Plan  Principal Problem:   Chest pain -Admit to telemetry/observation  status -Cycle cardiac enzymes -Heart score 4 but history difficult to obtain; given risk factors and known noncompliance and lack of following up with prescribed discharge plan likely will need stress test this admission -Check 2-D echocardiogram; last  echocardiogram was in 2011 and showed no abnormalities -Blood pressure well controlled on Norvasc-will not add beta blocker at this juncture -Suspect GI etiology especially with history of prior alcohol use: Differential includes gastritis, pancreatitis, or biliary etiology -Check abdominal ultrasound and lipase -Check fecal occult blood -Continue baby aspirin  Active Problems:   Diabetes mellitus type 2, controlled -Hemoglobin A1c August 2015 was normal at 6.7 -While nothing by mouth for testing sliding scale insulin CBG checks every 4 hours -Hold metformin during acute illness     Essential hypertension -Current blood pressure well controlled on home regimen    History of alcohol abuse -Patient currently denies alcohol usage and this is supported by normal alcohol level and the fact patient not having any withdrawal symptoms with this normal level -As precaution we'll provide medical surgical CIWA protocol    Chronic pain -Continue home medications    Prostatitis/Enterococcus UTI -UTI confirmed with culture and was started on Cipro as an outpatient -Suspect prostatitis contributing so will need prolonged course of antibiotics; consider outpatient referral to urology if has not been accomplished in the past -We'll discontinue Cipro and ask pharmacy to assist with dosing ampicillin    DVT Prophylaxis: Lovenox  Family Communication:   No family at bedside  Code Status:  Full code  Condition:  Stable  Time spent in minutes : 60   Bharat Antillon L. ANP on 03/18/2015 at 10:01 AM  Between 7am to 7pm - Pager - [phone]  After 7pm go to [email or web address] - password [email or web address]  And look for the night coverage person covering me after hours  [facility]

## 2015-03-18 NOTE — ED Notes (Signed)
Pt returns from xray. Placed back on cardiac monitoring

## 2015-03-18 NOTE — ED Notes (Signed)
Pt; stated, Io woke up this morning with sharpe pain in my chest and tingling in my toes.

## 2015-03-18 NOTE — ED Notes (Signed)
ATTEMPTED TO CALL REPORT

## 2015-03-19 ENCOUNTER — Encounter (HOSPITAL_COMMUNITY): Payer: Self-pay | Admitting: General Practice

## 2015-03-19 DIAGNOSIS — R072 Precordial pain: Secondary | ICD-10-CM

## 2015-03-19 DIAGNOSIS — R079 Chest pain, unspecified: Secondary | ICD-10-CM | POA: Diagnosis not present

## 2015-03-19 DIAGNOSIS — I5032 Chronic diastolic (congestive) heart failure: Secondary | ICD-10-CM

## 2015-03-19 LAB — GLUCOSE, CAPILLARY
Glucose-Capillary: 117 mg/dL — ABNORMAL HIGH (ref 70–99)
Glucose-Capillary: 126 mg/dL — ABNORMAL HIGH (ref 70–99)

## 2015-03-19 LAB — TROPONIN I: Troponin I: <0.03 ng/mL (ref <0.031)

## 2015-03-19 NOTE — Progress Notes (Signed)
Echocardiogram 2D Echocardiogram has been performed.  [name] M 03/19/2015, 1:41 PM

## 2015-03-20 DIAGNOSIS — I5033 Acute on chronic diastolic (congestive) heart failure: Secondary | ICD-10-CM | POA: Diagnosis present

## 2015-03-20 DIAGNOSIS — I5032 Chronic diastolic (congestive) heart failure: Secondary | ICD-10-CM | POA: Diagnosis present

## 2015-03-20 LAB — URINE CULTURE: Colony Count: 10000

## 2015-03-20 NOTE — Discharge Summary (Signed)
Discharge Summary  [name]:[id number] DOB: 1951/04/16  PCP: Philis Fendt, MD  Admit date: 03/18/2015 Discharge date: 03/19/2015  Time spent: 15 minutes  Recommendations for Outpatient Follow-up:  1. Patient left AGAINST MEDICAL ADVICE. If he does follow up with his primary medical doctor, would recommend him getting extended course of ampicillin for treating his prostatitis/UTI  Discharge Diagnoses:  Active Hospital Problems   Diagnosis Date Noted  . Chest pain 03/18/2015  . Chronic diastolic heart failure 53/97/6734  . Enterococcus UTI 01/27/2015  . History of alcohol abuse 01/26/2015  . Prostatitis 01/12/2015  . Essential hypertension 11/03/2014  . Chronic pain 12/24/2013  . Diabetes mellitus type 2, controlled 09/06/2012  . Pituitary adenoma 09/06/2012    Resolved Hospital Problems   Diagnosis Date Noted Date Resolved  No resolved problems to display.    Discharge Condition: Left AGAINST MEDICAL ADVICE prior to being evaluated on day of discharge  Diet recommendation: Heart healthy  Filed Weights   03/18/15 0739  Weight: 75.751 kg (167 lb)    History of present illness:  Patient is a 64 year old male past history of alcohol abuse and medical noncompliance who had recent admission for pyelonephritis and presented to the emergency room on 3/20 with complaints of chest pain as well as symptoms consistent with a prostatitis/UTI. Patient had been previously noncompliant with those antibiotics  Hospital Course:  Principal Problem:   Chest pain: Enzymes 3 negative. Echocardiogram done noting grade 1 diastolic dysfunction. Patient on 3/21 decided to leave [name]. He refused to be seen and did not want any further work done. Unclear why. Would not stay for prescriptions or proper formal discharge. Active Problems:   Diabetes mellitus type 2, controlled   Pituitary adenoma   Chronic pain:   Essential hypertension   Prostatitis   History of  alcohol abuse   Prostatitis/Enterococcus UTI: See above. Did not take any prescriptions for antibiotics   Chronic diastolic heart failure: Unable to check in volume overloaded   Procedures:  Echocardiogram noted grade 1 diastolic dysfunction  Consultations:  None  Discharge Exam: BP 127/56 mmHg  Pulse 69  Temp(Src) 98.2 F (36.8 C) (Oral)  Resp 16  Ht 5\' 10"  (1.778 m)  Wt 75.751 kg (167 lb)  BMI 23.96 kg/m2  SpO2 98%  No physical exam done as patient left AMA prior to being seen*  Discharge Instructions You were cared for by a hospitalist during your hospital stay. If you have any questions about your discharge medications or the care you received while you were in the hospital after you are discharged, you can call the unit and asked to speak with the hospitalist on call if the hospitalist that took care of you is not available. Once you are discharged, your primary care physician will handle any further medical issues. Please note that NO REFILLS for any discharge medications will be authorized once you are discharged, as it is imperative that you return to your primary care physician (or establish a relationship with a primary care physician if you do not have one) for your aftercare needs so that they can reassess your need for medications and monitor your lab values.     Medication List    ASK your doctor about these medications        acetaminophen 500 MG tablet  Commonly known as:  TYLENOL  Take 500 mg by mouth every 6 (six) hours as needed for headache.     amLODipine 10 MG tablet  Commonly known as:  NORVASC  Take 10 mg by mouth daily.     aspirin EC 81 MG tablet  Take 81 mg by mouth daily.     b complex vitamins tablet  Take 1 tablet by mouth daily.     hydrALAZINE 100 MG tablet  Commonly known as:  APRESOLINE  Take 1 tablet (100 mg total) by mouth every 8 (eight) hours.     isosorbide dinitrate 10 MG tablet  Commonly known as:  ISORDIL  Take 1 tablet  (10 mg total) by mouth 3 (three) times daily.     metFORMIN 500 MG tablet  Commonly known as:  GLUCOPHAGE  Take 500 mg by mouth 2 (two) times daily with a meal.     nicotine 21 mg/24hr patch  Commonly known as:  NICODERM CQ - dosed in mg/24 hours  Place 1 patch (21 mg total) onto the skin daily.     pravastatin 20 MG tablet  Commonly known as:  PRAVACHOL  Take 20 mg by mouth at bedtime.     tamsulosin 0.4 MG Caps capsule  Commonly known as:  FLOMAX  Take 1 capsule (0.4 mg total) by mouth daily.     traZODone 100 MG tablet  Commonly known as:  DESYREL  Take 1 tablet (100 mg total) by mouth at bedtime as needed for sleep.       No Known Allergies    The results of significant diagnostics from this hospitalization (including imaging, microbiology, ancillary and laboratory) are listed below for reference.    Significant Diagnostic Studies: Dg Chest 2 View  03/18/2015   CLINICAL DATA:  Sharp chest pain  EXAM: CHEST  2 VIEW  COMPARISON:  01/26/2015  FINDINGS: The lungs are well aerated bilaterally. Bilateral nipple shadows are seen. No focal infiltrate or sizable effusion is seen. No bony abnormality is noted.  IMPRESSION: No active cardiopulmonary disease.   Electronically Signed   By: Inez Catalina M.D.   On: 03/18/2015 08:25   US Abdomen Complete  03/18/2015   CLINICAL DATA:  Epigastric pain  EXAM: ULTRASOUND ABDOMEN COMPLETE  COMPARISON:  01/29/2015  FINDINGS: Gallbladder: No gallstones or wall thickening visualized. No sonographic Murphy sign noted.  Common bile duct: Diameter: 3.6 mm  Liver: Cystic lesion is noted in the lateral segment of left lobe of the liver stable from the prior CT examination.  IVC: No abnormality visualized.  Pancreas: Visualized portion unremarkable.  Spleen: Size and appearance within normal limits.  Right Kidney: Length: 11.6 cm. Echogenicity within normal limits. No mass or hydronephrosis visualized.  Left Kidney: Length: 11.2 cm. Echogenicity within  normal limits. No mass or hydronephrosis visualized.  Abdominal aorta: No aneurysm visualized.  Other findings: The bladder shows wall thickening and multiple diverticula similar to that seen on prior exam.  IMPRESSION: Changes similar to that seen on recent CT examination. No acute abnormality is noted.   Electronically Signed   By: Inez Catalina M.D.   On: 03/18/2015 12:13    Microbiology: Recent Results (from the past 240 hour(s))  Urine culture     Status: None   Collection Time: 03/18/15  9:30 AM  Result Value Ref Range Status   Specimen Description URINE, CLEAN CATCH  Final   Special Requests ADDED 2345  Final   Colony Count   Final    10,000 COLONIES/ML Performed at [facility]    Culture   Final    Multiple bacterial morphotypes present, none predominant. Suggest appropriate recollection  if clinically indicated. Performed at [facility]    Report Status 03/20/2015 FINAL  Final     Labs: Basic Metabolic Panel:  Recent Labs Lab 03/18/15 0827 03/18/15 0926  NA 138  --   K 5.9* 4.0  CL 106  --   GLUCOSE 105*  --   BUN 11  --   CREATININE 0.70  --    Liver Function Tests: No results for input(s): AST, ALT, ALKPHOS, BILITOT, PROT, ALBUMIN in the last 168 hours.  Recent Labs Lab 03/18/15 0926  LIPASE 33   No results for input(s): AMMONIA in the last 168 hours. CBC:  Recent Labs Lab 03/18/15 0754 03/18/15 0827  WBC 14.1*  --   NEUTROABS 8.2*  --   HGB 13.8 13.9  HCT 42.4 41.0  MCV 73.4*  --   PLT 304  --    Cardiac Enzymes:  Recent Labs Lab 03/18/15 1550 03/18/15 1926 03/19/15 0132  TROPONINI <0.03 <0.03 <0.03   BNP: BNP (last 3 results)  Recent Labs  01/27/15 0215 03/18/15 0754  BNP 79.3 31.2    ProBNP (last 3 results)  Recent Labs  10/15/14 0721 11/03/14 0042  PROBNP 73.0 1363.0*    CBG:  Recent Labs Lab 03/18/15 1644 03/18/15 1850 03/18/15 2055 03/19/15 0702 03/19/15 1132  GLUCAP 199* 118* 99 117* 126*         Signed:  KRISHNAN,SENDIL K  [facility] 03/20/2015, 7:21 PM

## 2015-07-02 ENCOUNTER — Encounter (HOSPITAL_COMMUNITY): Payer: Self-pay | Admitting: Emergency Medicine

## 2015-07-02 ENCOUNTER — Emergency Department (HOSPITAL_COMMUNITY): Payer: Medicaid Other

## 2015-07-02 ENCOUNTER — Emergency Department (HOSPITAL_COMMUNITY)
Admission: EM | Admit: 2015-07-02 | Discharge: 2015-07-02 | Disposition: A | Discharge status: Home / Self Care | Payer: Medicaid Other | Attending: Emergency Medicine | Admitting: Emergency Medicine

## 2015-07-02 DIAGNOSIS — I10 Essential (primary) hypertension: Secondary | ICD-10-CM | POA: Insufficient documentation

## 2015-07-02 DIAGNOSIS — Z79899 Other long term (current) drug therapy: Secondary | ICD-10-CM | POA: Insufficient documentation

## 2015-07-02 DIAGNOSIS — K920 Hematemesis: Secondary | ICD-10-CM | POA: Diagnosis present

## 2015-07-02 DIAGNOSIS — G8929 Other chronic pain: Secondary | ICD-10-CM | POA: Insufficient documentation

## 2015-07-02 DIAGNOSIS — R1013 Epigastric pain: Secondary | ICD-10-CM

## 2015-07-02 DIAGNOSIS — Z87828 Personal history of other (healed) physical injury and trauma: Secondary | ICD-10-CM | POA: Insufficient documentation

## 2015-07-02 DIAGNOSIS — Z86011 Personal history of benign neoplasm of the brain: Secondary | ICD-10-CM | POA: Diagnosis not present

## 2015-07-02 DIAGNOSIS — I5030 Unspecified diastolic (congestive) heart failure: Secondary | ICD-10-CM | POA: Insufficient documentation

## 2015-07-02 DIAGNOSIS — Z7982 Long term (current) use of aspirin: Secondary | ICD-10-CM | POA: Diagnosis not present

## 2015-07-02 DIAGNOSIS — Z720 Tobacco use: Secondary | ICD-10-CM | POA: Diagnosis not present

## 2015-07-02 DIAGNOSIS — Z8673 Personal history of transient ischemic attack (TIA), and cerebral infarction without residual deficits: Secondary | ICD-10-CM | POA: Insufficient documentation

## 2015-07-02 DIAGNOSIS — E119 Type 2 diabetes mellitus without complications: Secondary | ICD-10-CM | POA: Diagnosis not present

## 2015-07-02 LAB — COMPREHENSIVE METABOLIC PANEL
ALK PHOS: 61 U/L (ref 38–126)
ALT: 44 U/L (ref 17–63)
AST: 39 U/L (ref 15–41)
Albumin: 4.1 g/dL (ref 3.5–5.0)
Anion gap: 11 (ref 5–15)
BUN: 12 mg/dL (ref 6–20)
CHLORIDE: 106 mmol/L (ref 101–111)
CO2: 21 mmol/L — ABNORMAL LOW (ref 22–32)
CREATININE: 0.82 mg/dL (ref 0.61–1.24)
Calcium: 9.7 mg/dL (ref 8.9–10.3)
GFR calc Af Amer: >60 mL/min (ref >60)
GFR calc non Af Amer: >60 mL/min (ref >60)
Glucose, Bld: 165 mg/dL — ABNORMAL HIGH (ref 65–99)
Potassium: 3.7 mmol/L (ref 3.5–5.1)
Sodium: 138 mmol/L (ref 135–145)
Total Bilirubin: 0.9 mg/dL (ref 0.3–1.2)
Total Protein: 7.8 g/dL (ref 6.5–8.1)

## 2015-07-02 LAB — URINALYSIS, ROUTINE W REFLEX MICROSCOPIC
Glucose, UA: NEGATIVE mg/dL
Ketones, ur: NEGATIVE mg/dL
Nitrite: POSITIVE — AB
PROTEIN: NEGATIVE mg/dL
Specific Gravity, Urine: 1.039 — ABNORMAL HIGH (ref 1.005–1.030)
UROBILINOGEN UA: 1 mg/dL (ref 0.0–1.0)
pH: 5.5 (ref 5.0–8.0)

## 2015-07-02 LAB — CBC WITH DIFFERENTIAL/PLATELET
BASOS ABS: 0 10*3/uL (ref 0.0–0.1)
Basophils Relative: 0 % (ref 0–1)
EOS ABS: 0 10*3/uL (ref 0.0–0.7)
EOS PCT: 0 % (ref 0–5)
HEMATOCRIT: 43.8 % (ref 39.0–52.0)
HEMOGLOBIN: 14.7 g/dL (ref 13.0–17.0)
LYMPHS ABS: 1.9 10*3/uL (ref 0.7–4.0)
Lymphocytes Relative: 17 % (ref 12–46)
MCH: 23.7 pg — ABNORMAL LOW (ref 26.0–34.0)
MCHC: 33.6 g/dL (ref 30.0–36.0)
MCV: 70.5 fL — AB (ref 78.0–100.0)
Monocytes Absolute: 0.6 10*3/uL (ref 0.1–1.0)
Monocytes Relative: 5 % (ref 3–12)
NEUTROS ABS: 8.8 10*3/uL — AB (ref 1.7–7.7)
Neutrophils Relative %: 78 % — ABNORMAL HIGH (ref 43–77)
PLATELETS: 270 10*3/uL (ref 150–400)
RBC: 6.21 MIL/uL — ABNORMAL HIGH (ref 4.22–5.81)
RDW: 15 % (ref 11.5–15.5)
WBC: 11.3 10*3/uL — ABNORMAL HIGH (ref 4.0–10.5)

## 2015-07-02 LAB — POC OCCULT BLOOD, ED: Fecal Occult Bld: NEGATIVE

## 2015-07-02 LAB — LIPASE, BLOOD: LIPASE: 22 U/L (ref 22–51)

## 2015-07-02 LAB — PROTIME-INR
INR: 0.98 (ref 0.00–1.49)
Prothrombin Time: 13.2 seconds (ref 11.6–15.2)

## 2015-07-02 LAB — URINE MICROSCOPIC-ADD ON

## 2015-07-02 MED ORDER — HYDROMORPHONE HCL 1 MG/ML IJ SOLN
0.5000 mg | Freq: Once | INTRAMUSCULAR | Status: AC
Start: 2015-07-02 — End: 2015-07-02
  Administered 2015-07-02: 0.5 mg via INTRAVENOUS
  Filled 2015-07-02: qty 1

## 2015-07-02 MED ORDER — HYDROMORPHONE HCL 1 MG/ML IJ SOLN
0.5000 mg | Freq: Once | INTRAMUSCULAR | Status: AC
  Administered 2015-07-02: 0.5 mg via INTRAVENOUS
  Filled 2015-07-02: qty 1

## 2015-07-02 MED ORDER — PANTOPRAZOLE SODIUM 20 MG PO TBEC: 20.0000 mg | DELAYED_RELEASE_TABLET | Freq: Every day | ORAL | Status: DC

## 2015-07-02 MED ORDER — PANTOPRAZOLE SODIUM 40 MG PO TBEC
40.0000 mg | DELAYED_RELEASE_TABLET | ORAL | Status: AC
  Administered 2015-07-02: 40 mg via ORAL
  Filled 2015-07-02: qty 1

## 2015-07-02 MED ORDER — ONDANSETRON 4 MG PO TBDP: ORAL_TABLET | ORAL | Status: DC

## 2015-07-02 MED ORDER — IOHEXOL 300 MG/ML  SOLN: 25.0000 mL | Freq: Once | INTRAMUSCULAR | Status: DC | PRN

## 2015-07-02 MED ORDER — OXYCODONE-ACETAMINOPHEN 5-325 MG PO TABS: 2.0000 | ORAL_TABLET | ORAL | Status: DC | PRN

## 2015-07-02 MED ORDER — SODIUM CHLORIDE 0.9 % IV BOLUS (SEPSIS)
500.0000 mL | Freq: Once | INTRAVENOUS | Status: AC
  Administered 2015-07-02: 500 mL via INTRAVENOUS

## 2015-07-02 MED ORDER — IOHEXOL 300 MG/ML  SOLN
80.0000 mL | Freq: Once | INTRAMUSCULAR | Status: AC | PRN
  Administered 2015-07-02: 80 mL via INTRAVENOUS

## 2015-07-02 NOTE — ED Provider Notes (Addendum)
CSN: [id number]     Arrival date & time 07/02/15  0053 History    This chart was scribed for [name], MD by Forrestine Him, ED Scribe. This patient was seen in room B15C/B15C and the patient's care was started 2:26 AM.   Chief Complaint  Patient presents with  . Abdominal Pain  . Hematemesis   Patient is a 64 y.o. male presenting with abdominal pain. The history is provided by the patient. No language interpreter was used.  Abdominal Pain Pain location:  Epigastric and periumbilical Pain quality: aching   Pain radiates to:  Does not radiate Pain severity:  Moderate Onset quality:  Gradual Timing:  Constant Progression:  Unchanged Chronicity:  New Context comment:  Spontaneously Relieved by:  Nothing Worsened by:  Nothing tried Ineffective treatments:  None tried Associated symptoms: nausea and vomiting   Associated symptoms: no chest pain, no cough, no diarrhea, no fatigue and no hematuria     HPI Comments: [name] is a 64 y.o. male with a PMHx of HTN, DM, and CHF who presents to the Emergency Department complaining of constant, ongoing diffuse abdominal pain onset 9:00 AM this morning. Pain is described as achy and rated 8/10. Pt also reports bright colored hematemesis. [name] denies any previous history of hematemesis. No OTC medications or home remedies attempted prior to arrival. He denies any diarrhea, fever, or chills at this time. Last bowel movement earlier today consisting of loose stools. No mucous or blood noted in stools. No alcohol consumption today. He denies illicit drug use habits. No known allergies to medications.  Past Medical History  Diagnosis Date  . Hypertension   . Diabetes mellitus   . Brain tumor   . CVA (cerebral vascular accident)   . Narcotic abuse   . TIA (transient ischemic attack) 09/06/2012  . Vertigo 09/06/2012  . HTN (hypertension), malignant 09/06/2012  . Tobacco use disorder 12/10/2013  . Alcohol dependence 12/10/2013  .  Angioedema of lips 12/24/2013  . Chronic headache   . Chronic back pain   . CHF (congestive heart failure)     diastolic  . Normal cardiac stress test 2009   Past Surgical History  Procedure Laterality Date  . No past surgeries     Family History  Problem Relation Age of Onset  . CAD Father     "heart attack in his 86's"  . Hypertension Father   . Diabetes Brother   . Diabetes Sister   . Hypertension Mother    History  Substance Use Topics  . Smoking status: Current Every Day Smoker -- 0.50 packs/day    Types: Cigarettes  . Smokeless tobacco: Current User  . Alcohol Use: 0.0 oz/week    0 Standard drinks or equivalent per week     Comment: 6 pack a week    Review of Systems  Constitutional: Negative for appetite change and fatigue.  HENT: Negative for congestion, ear discharge and sinus pressure.   Eyes: Negative for discharge.  Respiratory: Negative for cough.   Cardiovascular: Negative for chest pain.  Gastrointestinal: Positive for nausea, vomiting and abdominal pain. Negative for diarrhea and blood in stool.  Genitourinary: Negative for frequency and hematuria.  Musculoskeletal: Negative for back pain.  Skin: Negative for rash.  Neurological: Negative for seizures and headaches.  Psychiatric/Behavioral: Negative for hallucinations.      Allergies  Review of patient's allergies indicates no known allergies.  Home Medications   Prior to Admission medications   Medication Sig  Start Date End Date Taking? Authorizing Provider  acetaminophen (TYLENOL) 500 MG tablet Take 500 mg by mouth every 6 (six) hours as needed for headache.    Historical Provider, MD  amLODipine (NORVASC) 10 MG tablet Take 10 mg by mouth daily.    Historical Provider, MD  aspirin EC 81 MG tablet Take 81 mg by mouth daily.    Historical Provider, MD  b complex vitamins tablet Take 1 tablet by mouth daily.    Historical Provider, MD  hydrALAZINE (APRESOLINE) 100 MG tablet Take 1 tablet (100 mg  total) by mouth every 8 (eight) hours. 01/30/15   Reyne Dumas, MD  isosorbide dinitrate (ISORDIL) 10 MG tablet Take 1 tablet (10 mg total) by mouth 3 (three) times daily. 12/27/13   Debbe Odea, MD  metFORMIN (GLUCOPHAGE) 500 MG tablet Take 500 mg by mouth 2 (two) times daily with a meal.    Historical Provider, MD  nicotine (NICODERM CQ - DOSED IN MG/24 HOURS) 21 mg/24hr patch Place 1 patch (21 mg total) onto the skin daily. 01/30/15   Reyne Dumas, MD  pravastatin (PRAVACHOL) 20 MG tablet Take 20 mg by mouth at bedtime.    Historical Provider, MD  tamsulosin (FLOMAX) 0.4 MG CAPS capsule Take 1 capsule (0.4 mg total) by mouth daily. 01/15/15   Venetia Maxon Rama, MD  traZODone (DESYREL) 100 MG tablet Take 1 tablet (100 mg total) by mouth at bedtime as needed for sleep. 01/15/15   Venetia Maxon Rama, MD   Triage Vitals: BP 187/60 mmHg  Pulse 56  Temp(Src) 98.2 F (36.8 C) (Oral)  Resp 16  SpO2 100%   Physical Exam  Constitutional: He is oriented to person, place, and time. He appears well-developed and well-nourished.  HENT:  Head: Normocephalic and atraumatic.  Eyes: EOM are normal.  Neck: Normal range of motion.  Cardiovascular: Normal rate, regular rhythm, normal heart sounds and intact distal pulses.   Pulmonary/Chest: Effort normal and breath sounds normal. No respiratory distress.  Abdominal: Soft. He exhibits no distension. There is tenderness.  Mild to moderate epigastric and periumbilical tenderness noted  Genitourinary:  Normal appearing external rectum. Brown stool. Otherwise normal rectal exam  Musculoskeletal: Normal range of motion.  Neurological: He is alert and oriented to person, place, and time.  Skin: Skin is warm and dry.  Psychiatric: He has a normal mood and affect. Judgment normal.  Nursing note and vitals reviewed.   ED Course  Procedures (including critical care time)  DIAGNOSTIC STUDIES: Oxygen Saturation is 100% on RA, Normal by my interpretation.     COORDINATION OF CARE: 2:32 AM- Will give fluids and Dilaudid. Will order CT abdomen pelvis with contrast, PT-INR, CBC, CMP, Lipase, urinalysis. Discussed treatment plan with pt at bedside and pt agreed to plan.     Labs Review Labs Reviewed  CBC WITH DIFFERENTIAL/PLATELET - Abnormal; Notable for the following:    WBC 11.3 (*)    RBC 6.21 (*)    MCV 70.5 (*)    MCH 23.7 (*)    Neutrophils Relative % 78 (*)    Neutro Abs 8.8 (*)    All other components within normal limits  COMPREHENSIVE METABOLIC PANEL - Abnormal; Notable for the following:    CO2 21 (*)    Glucose, Bld 165 (*)    All other components within normal limits  LIPASE, BLOOD  PROTIME-INR  URINALYSIS, ROUTINE W REFLEX MICROSCOPIC (NOT AT [facility])  OCCULT BLOOD X 1 CARD TO LAB, STOOL  POC OCCULT  BLOOD, ED    Imaging Review Ct Abdomen Pelvis W Contrast  07/02/2015   CLINICAL DATA:  Epigastric pain, bilious emesis. History of hypertension, diabetes, stroke, brain tumor.  EXAM: CT ABDOMEN AND PELVIS WITH CONTRAST  TECHNIQUE: Multidetector CT imaging of the abdomen and pelvis was performed using the standard protocol following bolus administration of intravenous contrast.  CONTRAST:  66mL OMNIPAQUE IOHEXOL 300 MG/ML  SOLN  COMPARISON:  CT abdomen and pelvis January 29, 2015  FINDINGS: LUNG BASES: Included view of the lung bases are clear. Scattered small pneumatoceles. Visualized heart size is mildly enlarged. No pericardial effusions.  SOLID ORGANS: The liver demonstrates 19 mm cyst LEFT lobe of the liver, to is otherwise unremarkable. Spleen, gallbladder, pancreas and adrenal glands are unremarkable.  GASTROINTESTINAL TRACT: Small hiatal hernia. The stomach, small and large bowel are normal in course and caliber without inflammatory changes. Moderate to severe colonic diverticulosis. Normal appendix.  KIDNEYS/ URINARY TRACT: Kidneys are orthotopic, demonstrating symmetric enhancement. No nephrolithiasis, hydronephrosis or solid renal  masses. The unopacified ureters are normal in course and caliber. Delayed imaging through the kidneys demonstrates symmetric prompt contrast excretion within the proximal urinary collecting system. Urinary bladder is very distended, with similar diffuse wall thickening, multiple diverticula, LEFT ureterocele unchanged. No intravesicular calculi.  PERITONEUM/RETROPERITONEUM: Aortoiliac vessels are normal in course and caliber, moderate calcific atherosclerosis. No lymphadenopathy by CT size criteria. Prostate size is normal. No intraperitoneal free fluid nor free air.  SOFT TISSUE/OSSEOUS STRUCTURES: Non-suspicious. Small LEFT fat containing inguinal hernia.  IMPRESSION: No acute intra-abdominal or pelvic process.  Chronic bladder wall thickening and diverticula suggests chronic bladder outlet obstruction.  Colonic diverticulosis without acute diverticulitis.   Electronically Signed   By: Elon Alas M.D.   On: 07/02/2015 04:39     EKG Interpretation   Date/Time:  Monday July 02 2015 04:25:19 EDT Ventricular Rate:  62 PR Interval:  160 QRS Duration: 89 QT Interval:  464 QTC Calculation: 471 R Axis:   55 Text Interpretation:  Sinus rhythm RAE, consider biatrial enlargement  Probable anteroseptal infarct, old Baseline wander in lead(s) V5 V6  Confirmed by Blasa Raisch  MD, Martiza Speth ([id number]) on 07/02/2015 5:14:08 AM      MDM   Final diagnoses:  Epigastric pain  Hematemesis with nausea    6:48 AM 64 y.o. male w hx of HTn, DM, CVA, etoh/narcotic abuse, chf who presents with epigastric and periumbilical abdominal pain as well as 4 episodes of emesis with bright red blood. He denies ongoing EtOH use. He is a poor historian. He denies any fevers or seeing any bloody stools. Hemoccult-negative here with brown stool. We'll get screening lab work, pain control, and CT scan.  6:49 AM: I interpreted/reviewed the labs and/or imaging which were non-contributory.  Hgb stable, normal BUN, CT non-contrib.  Patient has not had any more vomiting since he has been here (6hrs). Pain is improved and abdomen remains benign. He is eating applesauce here.  I have discussed the diagnosis/risks/treatment options with the patient and believe the pt to be eligible for discharge home to follow-up with his pcp vs GI. We also discussed returning to the ED immediately if new or worsening sx occur. We discussed the sx which are most concerning (e.g., worsening pain, recurrent bloody emesis, fever) that necessitate immediate return. Medications administered to the patient during their visit and any new prescriptions provided to the patient are listed below.  Medications given during this visit Medications  pantoprazole (PROTONIX) EC tablet 40 mg (not administered)  sodium chloride 0.9 % bolus 500 mL (0 mLs Intravenous Stopped 07/02/15 0343)  HYDROmorphone (DILAUDID) injection 0.5 mg (0.5 mg Intravenous Given 07/02/15 0302)  iohexol (OMNIPAQUE) 300 MG/ML solution 80 mL (80 mLs Intravenous Contrast Given 07/02/15 0353)  HYDROmorphone (DILAUDID) injection 0.5 mg (0.5 mg Intravenous Given 07/02/15 0501)    New Prescriptions   ONDANSETRON (ZOFRAN ODT) 4 MG DISINTEGRATING TABLET    4mg  ODT q4 hours prn nausea/vomit   OXYCODONE-ACETAMINOPHEN (PERCOCET) 5-325 MG PER TABLET    Take 2 tablets by mouth every 4 (four) hours as needed.   PANTOPRAZOLE (PROTONIX) 20 MG TABLET    Take 1 tablet (20 mg total) by mouth daily.      I personally performed the services described in this documentation, which was scribed in my presence. The recorded information has been reviewed and is accurate.    [name], MD 07/02/15 [id number]  [name], MD 07/02/15 [phone]

## 2015-07-02 NOTE — ED Notes (Signed)
Pt. reports generalized abdominal pain and hematemesis this evening , no diarrhea , denies fever or chills.

## 2015-07-02 NOTE — Discharge Instructions (Signed)

## 2015-07-02 NOTE — ED Notes (Signed)
Patient transported to CT SCAN . 

## 2015-07-02 NOTE — ED Notes (Signed)
Pt given urinal to provide urine sample at bedside; pt sts "i'll try"

## 2015-08-06 ENCOUNTER — Encounter (HOSPITAL_COMMUNITY): Payer: Self-pay | Admitting: Emergency Medicine

## 2015-08-06 ENCOUNTER — Emergency Department (HOSPITAL_COMMUNITY)
Admission: EM | Admit: 2015-08-06 | Discharge: 2015-08-06 | Disposition: A | Discharge status: Home / Self Care | Payer: Medicaid Other | Attending: Emergency Medicine | Admitting: Emergency Medicine

## 2015-08-06 ENCOUNTER — Emergency Department (HOSPITAL_COMMUNITY): Payer: Medicaid Other

## 2015-08-06 DIAGNOSIS — E119 Type 2 diabetes mellitus without complications: Secondary | ICD-10-CM | POA: Insufficient documentation

## 2015-08-06 DIAGNOSIS — Z86011 Personal history of benign neoplasm of the brain: Secondary | ICD-10-CM | POA: Diagnosis not present

## 2015-08-06 DIAGNOSIS — I10 Essential (primary) hypertension: Secondary | ICD-10-CM | POA: Insufficient documentation

## 2015-08-06 DIAGNOSIS — N390 Urinary tract infection, site not specified: Secondary | ICD-10-CM | POA: Diagnosis not present

## 2015-08-06 DIAGNOSIS — R319 Hematuria, unspecified: Secondary | ICD-10-CM

## 2015-08-06 DIAGNOSIS — G8929 Other chronic pain: Secondary | ICD-10-CM | POA: Diagnosis not present

## 2015-08-06 DIAGNOSIS — R0602 Shortness of breath: Secondary | ICD-10-CM | POA: Diagnosis not present

## 2015-08-06 DIAGNOSIS — Z720 Tobacco use: Secondary | ICD-10-CM | POA: Insufficient documentation

## 2015-08-06 DIAGNOSIS — N4889 Other specified disorders of penis: Secondary | ICD-10-CM | POA: Diagnosis not present

## 2015-08-06 DIAGNOSIS — Z79899 Other long term (current) drug therapy: Secondary | ICD-10-CM | POA: Insufficient documentation

## 2015-08-06 DIAGNOSIS — I5030 Unspecified diastolic (congestive) heart failure: Secondary | ICD-10-CM | POA: Insufficient documentation

## 2015-08-06 DIAGNOSIS — Z8673 Personal history of transient ischemic attack (TIA), and cerebral infarction without residual deficits: Secondary | ICD-10-CM | POA: Diagnosis not present

## 2015-08-06 DIAGNOSIS — Z7982 Long term (current) use of aspirin: Secondary | ICD-10-CM | POA: Insufficient documentation

## 2015-08-06 LAB — COMPREHENSIVE METABOLIC PANEL
ALBUMIN: 3.8 g/dL (ref 3.5–5.0)
ALK PHOS: 53 U/L (ref 38–126)
ALT: 28 U/L (ref 17–63)
AST: 38 U/L (ref 15–41)
Anion gap: 10 (ref 5–15)
BUN: 10 mg/dL (ref 6–20)
CALCIUM: 9.3 mg/dL (ref 8.9–10.3)
CO2: 23 mmol/L (ref 22–32)
CREATININE: 1 mg/dL (ref 0.61–1.24)
Chloride: 106 mmol/L (ref 101–111)
GFR calc Af Amer: >60 mL/min (ref >60)
Glucose, Bld: 77 mg/dL (ref 65–99)
Potassium: 3.9 mmol/L (ref 3.5–5.1)
SODIUM: 139 mmol/L (ref 135–145)
Total Bilirubin: 0.5 mg/dL (ref 0.3–1.2)
Total Protein: 7.2 g/dL (ref 6.5–8.1)

## 2015-08-06 LAB — CBC WITH DIFFERENTIAL/PLATELET
BASOS ABS: 0.1 10*3/uL (ref 0.0–0.1)
BASOS PCT: 1 % (ref 0–1)
Eosinophils Absolute: 0.3 10*3/uL (ref 0.0–0.7)
Eosinophils Relative: 3 % (ref 0–5)
HEMATOCRIT: 38.9 % — AB (ref 39.0–52.0)
Hemoglobin: 12.8 g/dL — ABNORMAL LOW (ref 13.0–17.0)
LYMPHS PCT: 34 % (ref 12–46)
Lymphs Abs: 3.2 10*3/uL (ref 0.7–4.0)
MCH: 23.8 pg — AB (ref 26.0–34.0)
MCHC: 32.9 g/dL (ref 30.0–36.0)
MCV: 72.4 fL — ABNORMAL LOW (ref 78.0–100.0)
MONOS PCT: 7 % (ref 3–12)
Monocytes Absolute: 0.7 10*3/uL (ref 0.1–1.0)
Neutro Abs: 5.1 10*3/uL (ref 1.7–7.7)
Neutrophils Relative %: 55 % (ref 43–77)
Platelets: 275 10*3/uL (ref 150–400)
RBC: 5.37 MIL/uL (ref 4.22–5.81)
RDW: 15.6 % — ABNORMAL HIGH (ref 11.5–15.5)
WBC: 9.3 10*3/uL (ref 4.0–10.5)

## 2015-08-06 LAB — URINALYSIS, ROUTINE W REFLEX MICROSCOPIC
Glucose, UA: NEGATIVE mg/dL
KETONES UR: 40 mg/dL — AB
NITRITE: POSITIVE — AB
Specific Gravity, Urine: 1.018 (ref 1.005–1.030)
Urobilinogen, UA: 2 mg/dL — ABNORMAL HIGH (ref 0.0–1.0)
pH: 7.5 (ref 5.0–8.0)

## 2015-08-06 LAB — URINE MICROSCOPIC-ADD ON

## 2015-08-06 LAB — CBG MONITORING, ED: Glucose-Capillary: 104 mg/dL — ABNORMAL HIGH (ref 65–99)

## 2015-08-06 MED ORDER — DEXTROSE 5 % IV SOLN: 1.0000 g | Freq: Once | INTRAVENOUS | Status: DC

## 2015-08-06 MED ORDER — CEFTRIAXONE SODIUM 1 G IJ SOLR
1.0000 g | Freq: Once | INTRAMUSCULAR | Status: AC
  Administered 2015-08-06: 1 g via INTRAMUSCULAR
  Filled 2015-08-06: qty 10

## 2015-08-06 MED ORDER — TAMSULOSIN HCL 0.4 MG PO CAPS: 0.4000 mg | ORAL_CAPSULE | Freq: Every day | ORAL | Status: DC

## 2015-08-06 MED ORDER — IOHEXOL 300 MG/ML  SOLN: 25.0000 mL | INTRAMUSCULAR | Status: AC

## 2015-08-06 MED ORDER — MORPHINE SULFATE 4 MG/ML IJ SOLN
4.0000 mg | Freq: Once | INTRAMUSCULAR | Status: AC
  Administered 2015-08-06: 4 mg via INTRAVENOUS
  Filled 2015-08-06: qty 1

## 2015-08-06 MED ORDER — TRAMADOL HCL 50 MG PO TABS: 50.0000 mg | ORAL_TABLET | Freq: Four times a day (QID) | ORAL | Status: DC | PRN

## 2015-08-06 MED ORDER — ONDANSETRON HCL 4 MG/2ML IJ SOLN
4.0000 mg | Freq: Once | INTRAMUSCULAR | Status: AC
  Administered 2015-08-06: 4 mg via INTRAVENOUS
  Filled 2015-08-06: qty 2

## 2015-08-06 MED ORDER — CEPHALEXIN 500 MG PO CAPS: 500.0000 mg | ORAL_CAPSULE | Freq: Four times a day (QID) | ORAL | Status: DC

## 2015-08-06 MED ORDER — IOHEXOL 300 MG/ML  SOLN: 25.0000 mL | INTRAMUSCULAR | Status: DC

## 2015-08-06 MED ORDER — LIDOCAINE HCL (PF) 1 % IJ SOLN
5.0000 mL | Freq: Once | INTRAMUSCULAR | Status: AC
  Administered 2015-08-06: 5 mL
  Filled 2015-08-06: qty 5

## 2015-08-06 MED ORDER — IOHEXOL 300 MG/ML  SOLN
100.0000 mL | Freq: Once | INTRAMUSCULAR | Status: AC | PRN
  Administered 2015-08-06: 100 mL via INTRAVENOUS

## 2015-08-06 NOTE — Discharge Instructions (Signed)
Hematuria Hematuria is blood in your urine. It can be caused by a bladder infection, kidney infection, prostate infection, kidney stone, or cancer of your urinary tract. Infections can usually be treated with medicine, and a kidney stone usually will pass through your urine. If neither of these is the cause of your hematuria, further workup to find out the reason may be needed. It is very important that you tell your health care provider about any blood you see in your urine, even if the blood stops without treatment or happens without causing pain. Blood in your urine that happens and then stops and then happens again can be a symptom of a very serious condition. Also, pain is not a symptom in the initial stages of many urinary cancers. HOME CARE INSTRUCTIONS   Drink lots of fluid, 3-4 quarts a day. If you have been diagnosed with an infection, cranberry juice is especially recommended, in addition to large amounts of water.  Avoid caffeine, tea, and carbonated beverages because they tend to irritate the bladder.  Avoid alcohol because it may irritate the prostate.  Take all medicines as directed by your health care provider.  If you were prescribed an antibiotic medicine, finish it all even if you start to feel better.  If you have been diagnosed with a kidney stone, follow your health care provider's instructions regarding straining your urine to catch the stone.  Empty your bladder often. Avoid holding urine for long periods of time.  After a bowel movement, women should cleanse front to back. Use each tissue only once.  Empty your bladder before and after sexual intercourse if you are a male. SEEK MEDICAL CARE IF:  You develop back pain.  You have a fever.  You have a feeling of sickness in your stomach (nausea) or vomiting.  Your symptoms are not better in 3 days. Return sooner if you are getting worse. SEEK IMMEDIATE MEDICAL CARE IF:   You develop severe vomiting and are  unable to keep the medicine down.  You develop severe back or abdominal pain despite taking your medicines.  You begin passing a large amount of blood or clots in your urine.  You feel extremely weak or faint, or you pass out. MAKE SURE YOU: Urinary Tract Infection Urinary tract infections (UTIs) can develop anywhere along your urinary tract. Your urinary tract is your body's drainage system for removing wastes and extra water. Your urinary tract includes two kidneys, two ureters, a bladder, and a urethra. Your kidneys are a pair of bean-shaped organs. Each kidney is about the size of your fist. They are located below your ribs, one on each side of your spine. CAUSES Infections are caused by microbes, which are microscopic organisms, including fungi, viruses, and bacteria. These organisms are so small that they can only be seen through a microscope. Bacteria are the microbes that most commonly cause UTIs. SYMPTOMS  Symptoms of UTIs may vary by age and gender of the patient and by the location of the infection. Symptoms in young women typically include a frequent and intense urge to urinate and a painful, burning feeling in the bladder or urethra during urination. Older women and men are more likely to be tired, shaky, and weak and have muscle aches and abdominal pain. A fever may mean the infection is in your kidneys. Other symptoms of a kidney infection include pain in your back or sides below the ribs, nausea, and vomiting. DIAGNOSIS To diagnose a UTI, your caregiver will ask you  about your symptoms. Your caregiver also will ask to provide a urine sample. The urine sample will be tested for bacteria and white blood cells. White blood cells are made by your body to help fight infection. TREATMENT  Typically, UTIs can be treated with medication. Because most UTIs are caused by a bacterial infection, they usually can be treated with the use of antibiotics. The choice of antibiotic and length of  treatment depend on your symptoms and the type of bacteria causing your infection. HOME CARE INSTRUCTIONS  If you were prescribed antibiotics, take them exactly as your caregiver instructs you. Finish the medication even if you feel better after you have only taken some of the medication.  Drink enough water and fluids to keep your urine clear or pale yellow.  Avoid caffeine, tea, and carbonated beverages. They tend to irritate your bladder.  Empty your bladder often. Avoid holding urine for long periods of time.  Empty your bladder before and after sexual intercourse.  After a bowel movement, women should cleanse from front to back. Use each tissue only once. SEEK MEDICAL CARE IF:   You have back pain.  You develop a fever.  Your symptoms do not begin to resolve within 3 days. SEEK IMMEDIATE MEDICAL CARE IF:   You have severe back pain or lower abdominal pain.  You develop chills.  You have nausea or vomiting.  You have continued burning or discomfort with urination. MAKE SURE YOU:   Understand these instructions.  Will watch your condition.  Will get help right away if you are not doing well or get worse. Document Released: 09/24/2005 Document Revised: 06/15/2012 Document Reviewed: 01/23/2012 [facility] Patient Information 2015 [place], [place]. This information is not intended to replace advice given to you by your health care provider. Make sure you discuss any questions you have with your health care provider.   Understand these instructions.  Will watch your condition.  Will get help right away if you are not doing well or get worse. Document Released: 12/15/2005 Document Revised: 05/01/2014 Document Reviewed: 08/15/2013 [facility] Patient Information 2015 [place], [place]. This information is not intended to replace advice given to you by your health care provider. Make sure you discuss any questions you have with your health care provider.

## 2015-08-06 NOTE — ED Notes (Signed)
Declined W/C at D/C and was escorted to lobby by RN. 

## 2015-08-06 NOTE — ED Provider Notes (Signed)
Patient sign out from [place], [place]  " [name] is a 64 y.o. male, with multiple medical problems and recent hx of urinary tract infection, who presents to the Emergency Department complaining of hematuria that began last night. He reports associated dysuria ,lower abdominal pain, nausea and frequency of urination. He states he is experiencing mild penile tingling and penile swelling. Pt reports losing about nine pounds in the past month. He also reports night sweats that used to soak the sheets but not as severe anymore. Has occasional SOB. His last bowel movement was last night but he is unsure if there was any blood present. He rates his pain as 7/10. Pt has not done anything to treat his symptoms. He denies modifying factors. Pt denies any trauma, injury or fall. He denies back pain, flank pain, fever, chills, vomiting or CP. No current SOB. He denies any abdominal surgeries. Pt endorses smoking. PMHx of HTN, DM, brain tumor, CVA, chronic pain, CHF and polysubstance abuse."  Results for orders placed or performed during the hospital encounter of 08/06/15  Urinalysis, Routine w reflex microscopic-may I&O cath if menses (not at [facility])  Result Value Ref Range   Color, Urine RED (A) YELLOW   APPearance TURBID (A) CLEAR   Specific Gravity, Urine 1.018 1.005 - 1.030   pH 7.5 5.0 - 8.0   Glucose, UA NEGATIVE NEGATIVE mg/dL   Hgb urine dipstick LARGE (A) NEGATIVE   Bilirubin Urine LARGE (A) NEGATIVE   Ketones, ur 40 (A) NEGATIVE mg/dL   Protein, ur >300 (A) NEGATIVE mg/dL   Urobilinogen, UA 2.0 (H) 0.0 - 1.0 mg/dL   Nitrite POSITIVE (A) NEGATIVE   Leukocytes, UA LARGE (A) NEGATIVE  Urine microscopic-add on  Result Value Ref Range   WBC, UA TOO NUMEROUS TO COUNT <3 WBC/hpf   RBC / HPF TOO NUMEROUS TO COUNT <3 RBC/hpf   Bacteria, UA MANY (A) RARE   Urine-Other LESS THAN 10 mL OF URINE SUBMITTED   CBC with Differential  Result Value Ref Range   WBC 9.3 4.0 - 10.5 K/uL   RBC 5.37 4.22  - 5.81 MIL/uL   Hemoglobin 12.8 (L) 13.0 - 17.0 g/dL   HCT 38.9 (L) 39.0 - 52.0 %   MCV 72.4 (L) 78.0 - 100.0 fL   MCH 23.8 (L) 26.0 - 34.0 pg   MCHC 32.9 30.0 - 36.0 g/dL   RDW 15.6 (H) 11.5 - 15.5 %   Platelets 275 150 - 400 K/uL   Neutrophils Relative % 55 43 - 77 %   Neutro Abs 5.1 1.7 - 7.7 K/uL   Lymphocytes Relative 34 12 - 46 %   Lymphs Abs 3.2 0.7 - 4.0 K/uL   Monocytes Relative 7 3 - 12 %   Monocytes Absolute 0.7 0.1 - 1.0 K/uL   Eosinophils Relative 3 0 - 5 %   Eosinophils Absolute 0.3 0.0 - 0.7 K/uL   Basophils Relative 1 0 - 1 %   Basophils Absolute 0.1 0.0 - 0.1 K/uL  Comprehensive metabolic panel  Result Value Ref Range   Sodium 139 135 - 145 mmol/L   Potassium 3.9 3.5 - 5.1 mmol/L   Chloride 106 101 - 111 mmol/L   CO2 23 22 - 32 mmol/L   Glucose, Bld 77 65 - 99 mg/dL   BUN 10 6 - 20 mg/dL   Creatinine, Ser 1.00 0.61 - 1.24 mg/dL   Calcium 9.3 8.9 - 10.3 mg/dL   Total Protein 7.2 6.5 -  8.1 g/dL   Albumin 3.8 3.5 - 5.0 g/dL   AST 38 15 - 41 U/L   ALT 28 17 - 63 U/L   Alkaline Phosphatase 53 38 - 126 U/L   Total Bilirubin 0.5 0.3 - 1.2 mg/dL   GFR calc non Af Amer >60 >60 mL/min   GFR calc Af Amer >60 >60 mL/min   Anion gap 10 5 - 15  CBG monitoring, ED  Result Value Ref Range   Glucose-Capillary 104 (H) 65 - 99 mg/dL   Ct Abdomen Pelvis Wo Contrast  08/06/2015   CLINICAL DATA:  Lower abdominal pain, hematuria, weight loss, night sweats  EXAM: CT ABDOMEN AND PELVIS WITHOUT CONTRAST  TECHNIQUE: Multidetector CT imaging of the abdomen and pelvis was performed following the standard protocol without IV contrast.  COMPARISON:  07/02/2015  FINDINGS: Note: Difficult IV access. IV catheter placed in the right antecubital fossa under ultrasound guidance. However, following contrast administration, the contrast infiltrated into the right antecubital fossa. Given the unenhanced appearance on CT, the complete 100 mL Omnipaque 300 is assumed to have been extravasated.  Patient was evaluated in did not demonstrate any neurologic symptoms at the time. Patient was returned to the ED for further evaluation following unenhanced CT scanning.  Lower chest:  Minimal dependent atelectasis at the lung bases.  Cardiomegaly.  Hepatobiliary: Unenhanced liver is notable for 82.1 cm cyst in the lateral segment left hepatic lobe (series 21/ image 12).  Gallbladder is unremarkable. No intrahepatic or extrahepatic ductal dilatation.  Pancreas: Within normal limits.  Spleen: Within normal limits.  Adrenals/Urinary Tract: Adrenal glands are unremarkable.  Kidneys are unremarkable.  No renal calculi or hydronephrosis.  No ureteral calculi.  Thick-walled bladder. Dominant moderate to large right bladder diverticulum with layering hemorrhage (series 201/image 53). Additional minimal layering hemorrhage in the dependent bladder (series 201/ image 69). Small left posterolateral bladder diverticulum (series 201/ image 68).  Stomach/Bowel: Stomach is within normal limits.  No evidence of bowel obstruction.  Colonic diverticulosis, without evidence of diverticulitis.  Vascular/Lymphatic: Atherosclerotic calcifications of the abdominal aorta and branch vessels.  No suspicious abdominopelvic lymphadenopathy.  Reproductive: Prostate is unremarkable.  Other: No abdominopelvic ascites.  Small fat containing right inguinal hernia. Fat within the left inguinal canal.  Musculoskeletal: Mild degenerative changes of the lumbar spine.  IMPRESSION: Thick-walled bladder with moderate to large right bladder diverticulum, suggesting chronic bladder outlet obstruction. Layering hemorrhage within the bladder and right bladder diverticulum. Cystoscopic correlation is suggested.  No renal, ureteral, or bladder calculi.  No hydronephrosis.  Otherwise, no interval change from recent CT.  Contrast extravasation in the right antecubital fossa, as described above. Patient was returned to the ED for further evaluation following  unenhanced CT scanning.   Electronically Signed   By: Julian Hy M.D.   On: 08/06/2015 17:12     I spoke with Dr. Junious Silk from Urology and we viewed the patient's labs, CT scan and physical exam findings. He recommends a dose of Rocephin prior to discharge. He also recommends discharging with Flomax and Keflex. Also will send out a urine culture. Patient requests prescription for pain medicine. He is to call [facility] office tomorrow morning to arrange follow-up appointment and has been given strict return to emergency Department precautions.  Prior to discharge the right arm was reevaluated due to recent infiltration of the IV, there is moderate swelling but the tissue was soft and he denies having any pain- give return precautions and signs for compartment syndrome.  Medications  iohexol (OMNIPAQUE) 300 MG/ML solution 25 mL (not administered)  cefTRIAXone (ROCEPHIN) injection 1 g (not administered)  lidocaine (PF) (XYLOCAINE) 1 % injection 5 mL (not administered)  morphine 4 MG/ML injection 4 mg (4 mg Intravenous Given 08/06/15 1605)  ondansetron (ZOFRAN) injection 4 mg (4 mg Intravenous Given 08/06/15 1605)  iohexol (OMNIPAQUE) 300 MG/ML solution 100 mL (100 mLs Intravenous Contrast Given 08/06/15 1617)    64 y.o.Odis Hollingshead Moone's evaluation in the Emergency Department is complete. It has been determined that no acute conditions requiring further emergency intervention are present at this time. The patient/guardian have been advised of the diagnosis and plan. We have discussed signs and symptoms that warrant return to the ED, such as changes or worsening in symptoms.  Vital signs are stable at discharge. Filed Vitals:   08/06/15 1601  BP: 146/60  Pulse: 56  Temp:   Resp: 18    Patient/guardian has voiced understanding and agreed to follow-up with the PCP or specialist.   Delos Haring, PA-C 08/06/15 [place], MD 08/07/15 [phone]

## 2015-08-06 NOTE — ED Notes (Signed)
PT ambulatory to bathroom

## 2015-08-06 NOTE — ED Notes (Signed)
IV team at bed side to start IV. 

## 2015-08-06 NOTE — ED Notes (Signed)
TC from CT dept. To report IV infiltrated during test. E. West :PA spoke with CT staff

## 2015-08-06 NOTE — ED Notes (Signed)
Pt sts hematuria today when urinated and sts some lower abd pain with urination; pt sts takes 81 ASA

## 2015-08-06 NOTE — ED Provider Notes (Signed)
History  This chart was scribed for non-physician practitioner, Clayton Bibles, PA-C,working with Quintella Reichert, MD, by Marlowe Kays, ED Scribe. This patient was seen in room TR11C/TR11C and the patient's care was started at 2:31 PM.  Chief Complaint  Patient presents with  . Hematuria   HPI  HPI Comments:  [name] is a 64 y.o. male, with multiple medical problems and recent hx of urinary tract infection, who presents to the Emergency Department complaining of hematuria that began last night. He reports associated dysuria ,lower abdominal pain, nausea and frequency of urination. He states he is experiencing mild penile tingling and penile swelling. Pt reports losing about nine pounds in the past month. He also reports night sweats that used to soak the sheets but not as severe anymore. Has occasional SOB.  His last bowel movement was last night but he is unsure if there was any blood present. He rates his pain as 7/10. Pt has not done anything to treat his symptoms. He denies modifying factors. Pt denies any trauma, injury or fall. He denies back pain, flank pain, fever, chills, vomiting or CP.  No current SOB. He denies any abdominal surgeries. Pt endorses smoking. PMHx of HTN, DM, brain tumor, CVA, chronic pain, CHF and polysubstance abuse.  Past Medical History  Diagnosis Date  . Hypertension   . Diabetes mellitus   . Brain tumor   . CVA (cerebral vascular accident)   . Narcotic abuse   . TIA (transient ischemic attack) 09/06/2012  . Vertigo 09/06/2012  . HTN (hypertension), malignant 09/06/2012  . Tobacco use disorder 12/10/2013  . Alcohol dependence 12/10/2013  . Angioedema of lips 12/24/2013  . Chronic headache   . Chronic back pain   . CHF (congestive heart failure)     diastolic  . Normal cardiac stress test 2009   Past Surgical History  Procedure Laterality Date  . No past surgeries     Family History  Problem Relation Age of Onset  . CAD Father     "heart attack in  his 80's"  . Hypertension Father   . Diabetes Brother   . Diabetes Sister   . Hypertension Mother    History  Substance Use Topics  . Smoking status: Current Every Day Smoker -- 0.50 packs/day    Types: Cigarettes  . Smokeless tobacco: Current User  . Alcohol Use: 0.0 oz/week    0 Standard drinks or equivalent per week     Comment: 6 pack a week    Review of Systems  Constitutional: Negative for fever and chills.  Respiratory: Positive for shortness of breath.   Cardiovascular: Negative for chest pain.  Gastrointestinal: Positive for nausea and abdominal pain. Negative for vomiting, diarrhea and constipation.  Genitourinary: Positive for dysuria, frequency, hematuria, penile swelling and penile pain. Negative for flank pain.  Musculoskeletal: Negative for back pain.  Skin: Negative for color change and wound.  Allergic/Immunologic: Negative for immunocompromised state.  Hematological: Does not bruise/bleed easily.  All other systems reviewed and are negative.  Allergies  Review of patient's allergies indicates no known allergies.  Home Medications   Prior to Admission medications   Medication Sig Start Date End Date Taking? Authorizing Provider  acetaminophen (TYLENOL) 500 MG tablet Take 500 mg by mouth every 6 (six) hours as needed for headache.    Historical Provider, MD  amLODipine (NORVASC) 10 MG tablet Take 10 mg by mouth daily.    Historical Provider, MD  aspirin EC 81 MG tablet Take  81 mg by mouth daily.    Historical Provider, MD  b complex vitamins tablet Take 1 tablet by mouth daily.    Historical Provider, MD  hydrALAZINE (APRESOLINE) 100 MG tablet Take 1 tablet (100 mg total) by mouth every 8 (eight) hours. 01/30/15   Reyne Dumas, MD  isosorbide dinitrate (ISORDIL) 10 MG tablet Take 1 tablet (10 mg total) by mouth 3 (three) times daily. 12/27/13   Debbe Odea, MD  metFORMIN (GLUCOPHAGE) 500 MG tablet Take 500 mg by mouth 2 (two) times daily with a meal.     Historical Provider, MD  nicotine (NICODERM CQ - DOSED IN MG/24 HOURS) 21 mg/24hr patch Place 1 patch (21 mg total) onto the skin daily. 01/30/15   Reyne Dumas, MD  ondansetron (ZOFRAN ODT) 4 MG disintegrating tablet 4mg  ODT q4 hours prn nausea/vomit 07/02/15   [name], MD  oxyCODONE-acetaminophen (PERCOCET) 5-325 MG per tablet Take 2 tablets by mouth every 4 (four) hours as needed. 07/02/15   [name], MD  oxyCODONE-acetaminophen (PERCOCET/ROXICET) 5-325 MG per tablet Take 1 tablet by mouth daily as needed for moderate pain or severe pain.  06/08/15   Historical Provider, MD  pantoprazole (PROTONIX) 20 MG tablet Take 1 tablet (20 mg total) by mouth daily. 07/02/15   [name], MD  pravastatin (PRAVACHOL) 20 MG tablet Take 20 mg by mouth at bedtime.    Historical Provider, MD  tamsulosin (FLOMAX) 0.4 MG CAPS capsule Take 1 capsule (0.4 mg total) by mouth daily. 01/15/15   Venetia Maxon Rama, MD  traZODone (DESYREL) 100 MG tablet Take 1 tablet (100 mg total) by mouth at bedtime as needed for sleep. Patient not taking: Reported on 07/02/2015 01/15/15   Venetia Maxon Rama, MD   Triage Vitals: BP 158/57 mmHg  Pulse 82  Temp(Src) 98 F (36.7 C) (Oral)  Resp 17  Ht 5\' 9"  (1.753 m)  Wt 160 lb (72.576 kg)  BMI 23.62 kg/m2  SpO2 98% Physical Exam  Constitutional: He appears well-developed and well-nourished. No distress.  HENT:  Head: Normocephalic and atraumatic.  Neck: Neck supple.  Cardiovascular: Normal rate and regular rhythm.   Pulmonary/Chest: Effort normal and breath sounds normal. No respiratory distress. He has no wheezes. He has no rales.  Abdominal: Soft. He exhibits no distension and no mass. There is tenderness. There is guarding (voluntary). There is no rebound.  Tenderness to palpation throughout left side and suprapubic area with voluntary guarding.  Genitourinary: Testes normal. Right testis shows no mass, no swelling and no tenderness. Right testis is descended. Left  testis shows no mass, no swelling and no tenderness. Left testis is descended. Uncircumcised. No phimosis, paraphimosis, penile erythema or penile tenderness. No discharge found.  Neurological: He is alert. He exhibits normal muscle tone.  Skin: He is not diaphoretic.  Nursing note and vitals reviewed.   ED Course  Procedures (including critical care time) DIAGNOSTIC STUDIES: Oxygen Saturation is 98% on RA, normal by my interpretation.   COORDINATION OF CARE: 2:36 PM- Will order Morphine and Zofran prior to CT scan. Pt verbalizes understanding and agrees to plan.  Medications  morphine 4 MG/ML injection 4 mg (not administered)  ondansetron (ZOFRAN) injection 4 mg (not administered)   Labs Review Labs Reviewed  URINALYSIS, ROUTINE W REFLEX MICROSCOPIC (NOT AT [facility]) - Abnormal; Notable for the following:    Color, Urine RED (*)    APPearance TURBID (*)    Hgb urine dipstick LARGE (*)    Bilirubin Urine LARGE (*)  Ketones, ur 40 (*)    Protein, ur >300 (*)    Urobilinogen, UA 2.0 (*)    Nitrite POSITIVE (*)    Leukocytes, UA LARGE (*)    All other components within normal limits  URINE MICROSCOPIC-ADD ON - Abnormal; Notable for the following:    Bacteria, UA MANY (*)    All other components within normal limits  CBC WITH DIFFERENTIAL/PLATELET - Abnormal; Notable for the following:    Hemoglobin 12.8 (*)    HCT 38.9 (*)    MCV 72.4 (*)    MCH 23.8 (*)    RDW 15.6 (*)    All other components within normal limits  CBG MONITORING, ED - Abnormal; Notable for the following:    Glucose-Capillary 104 (*)    All other components within normal limits  URINE CULTURE  COMPREHENSIVE METABOLIC PANEL    Imaging Review No results found.   EKG Interpretation None      MDM   Final diagnoses:  Hematuria    Afebrile nontoxic patient with lower abdominal pain, hematuria.  Also with a few more long term symptoms of weight loss and night sweats.  Labs are at baseline.  UA  appears infected.  Culture pending.  Pt's IV did infiltrate during CT - examined by radiologist and pt has received outpatient treatment guidelines.  Pt signed out to Delos Haring, PA-C, pending CT abdomen pelvis.  She will treat and disposition per results and reassessment.    I personally performed the services described in this documentation, which was scribed in my presence. The recorded information has been reviewed and is accurate.    Clayton Bibles, PA-C 08/06/15 [place], MD 08/07/15 [phone]

## 2015-08-07 NOTE — Progress Notes (Signed)
CCrouse spoke to patient by phone at 1044am. Patient stated his arm had increased tightness and increased swelling. He was placing ice packs and elevating his arm as he was instructed. CCrouse asked him to come to the Radiology desk at [facility] to be assessed by the radiologist.

## 2015-08-08 LAB — URINE CULTURE: Culture: >=100000

## 2015-08-08 NOTE — Progress Notes (Signed)
Patient ID: [name], male   DOB: April 18, 1951, 64 y.o.   MRN: [id number]   Pt suffered contrast extravasation R arm in CT 8/8  Called pt to discuss symptoms No answer  LM to call [phone]

## 2015-08-09 NOTE — Progress Notes (Signed)
ED Antimicrobial Stewardship Positive Culture Follow Up   [name] is an 64 y.o. male who presented to [facility] on 08/06/2015 with a chief complaint of  Chief Complaint  Patient presents with  . Hematuria    Recent Results (from the past 720 hour(s))  Urine culture     Status: None   Collection Time: 08/06/15 12:09 PM  Result Value Ref Range Status   Specimen Description URINE, RANDOM  Final   Special Requests NONE  Final   Culture >=100,000 COLONIES/mL ENTEROCOCCUS SPECIES  Final   Report Status 08/08/2015 FINAL  Final   Organism ID, Bacteria ENTEROCOCCUS SPECIES  Final      Susceptibility   Enterococcus species - MIC*    AMPICILLIN <=2 SENSITIVE Sensitive     LEVOFLOXACIN 1 SENSITIVE Sensitive     NITROFURANTOIN <=16 SENSITIVE Sensitive     VANCOMYCIN 1 SENSITIVE Sensitive     * >=100,000 COLONIES/mL ENTEROCOCCUS SPECIES    [x]  Treated with cephalexin, organism resistant to prescribed antimicrobial  New antibiotic prescription: Amoxicillin 500 mg, Take 1 tablet by mouth 2 times daily for 7 days.  ED Provider: Jeannett Senior, PA-C   Ricka Burdock 08/09/2015, 8:52 AM Infectious Diseases Pharmacist Phone# [phone]

## 2015-08-10 ENCOUNTER — Telehealth (HOSPITAL_COMMUNITY): Payer: Self-pay

## 2015-08-10 NOTE — Telephone Encounter (Signed)
Post ED Visit - Positive Culture Follow-up: Successful Patient Follow-Up  Culture assessed and recommendations reviewed by: []  Heide Guile, Pharm.D., BCPS-AQ ID []  Alycia Rossetti, Pharm.D., BCPS []  [place], Pharm.D., BCPS, AAHIVP []  Legrand Como, Pharm.D., BCPS, AAHIVP []  Tegan Magsam, Pharm.D. []  Milus Glazier, [place].D.  Positive Urine culture, >/= 100,000 colonies -> Enterococcus Species  [x]  Patient discharged without antimicrobial prescription and treatment is now indicated []  Organism is resistant to prescribed ED discharge antimicrobial []  Patient with positive blood cultures  Changes discussed with ED provider: Adolm Joseph PA New antibiotic prescription "Amoxicillin 500 mg twice daily for 7 days"   Contacted patient, date 08/10/15, time 15:36 LVM requesting callback   Dortha Kern 08/10/2015, 3:40 PM

## 2015-08-12 ENCOUNTER — Telehealth (HOSPITAL_COMMUNITY): Payer: Self-pay | Admitting: Emergency Medicine

## 2015-08-13 ENCOUNTER — Telehealth (HOSPITAL_COMMUNITY): Payer: Self-pay | Admitting: *Deleted

## 2015-08-20 ENCOUNTER — Telehealth (HOSPITAL_COMMUNITY): Payer: Self-pay

## 2015-08-20 NOTE — Telephone Encounter (Signed)
Unable to contact pt by mail or telephone. Unable to communicate lab results or treatment changes. 

## 2015-12-23 ENCOUNTER — Emergency Department (HOSPITAL_COMMUNITY)
Admission: EM | Admit: 2015-12-23 | Discharge: 2015-12-23 | Disposition: A | Discharge status: Home / Self Care | Payer: Medicaid Other | Attending: Emergency Medicine | Admitting: Emergency Medicine

## 2015-12-23 ENCOUNTER — Encounter (HOSPITAL_COMMUNITY): Payer: Self-pay | Admitting: Vascular Surgery

## 2015-12-23 DIAGNOSIS — Z86018 Personal history of other benign neoplasm: Secondary | ICD-10-CM | POA: Diagnosis not present

## 2015-12-23 DIAGNOSIS — E119 Type 2 diabetes mellitus without complications: Secondary | ICD-10-CM | POA: Insufficient documentation

## 2015-12-23 DIAGNOSIS — Z792 Long term (current) use of antibiotics: Secondary | ICD-10-CM | POA: Diagnosis not present

## 2015-12-23 DIAGNOSIS — F17210 Nicotine dependence, cigarettes, uncomplicated: Secondary | ICD-10-CM | POA: Insufficient documentation

## 2015-12-23 DIAGNOSIS — I509 Heart failure, unspecified: Secondary | ICD-10-CM | POA: Insufficient documentation

## 2015-12-23 DIAGNOSIS — Z79899 Other long term (current) drug therapy: Secondary | ICD-10-CM | POA: Diagnosis not present

## 2015-12-23 DIAGNOSIS — I10 Essential (primary) hypertension: Secondary | ICD-10-CM | POA: Insufficient documentation

## 2015-12-23 DIAGNOSIS — Z7984 Long term (current) use of oral hypoglycemic drugs: Secondary | ICD-10-CM | POA: Diagnosis not present

## 2015-12-23 DIAGNOSIS — R319 Hematuria, unspecified: Secondary | ICD-10-CM | POA: Diagnosis present

## 2015-12-23 DIAGNOSIS — Z7982 Long term (current) use of aspirin: Secondary | ICD-10-CM | POA: Diagnosis not present

## 2015-12-23 DIAGNOSIS — G8929 Other chronic pain: Secondary | ICD-10-CM | POA: Insufficient documentation

## 2015-12-23 DIAGNOSIS — Z8673 Personal history of transient ischemic attack (TIA), and cerebral infarction without residual deficits: Secondary | ICD-10-CM | POA: Diagnosis not present

## 2015-12-23 DIAGNOSIS — N3091 Cystitis, unspecified with hematuria: Secondary | ICD-10-CM | POA: Insufficient documentation

## 2015-12-23 LAB — CBC WITH DIFFERENTIAL/PLATELET
BASOS ABS: 0 10*3/uL (ref 0.0–0.1)
BASOS PCT: 0 %
EOS ABS: 0.4 10*3/uL (ref 0.0–0.7)
EOS PCT: 4 %
HCT: 42.6 % (ref 39.0–52.0)
Hemoglobin: 13.6 g/dL (ref 13.0–17.0)
Lymphocytes Relative: 27 %
Lymphs Abs: 2.7 10*3/uL (ref 0.7–4.0)
MCH: 24 pg — ABNORMAL LOW (ref 26.0–34.0)
MCHC: 31.9 g/dL (ref 30.0–36.0)
MCV: 75.3 fL — ABNORMAL LOW (ref 78.0–100.0)
MONO ABS: 0.6 10*3/uL (ref 0.1–1.0)
Monocytes Relative: 6 %
Neutro Abs: 6.5 10*3/uL (ref 1.7–7.7)
Neutrophils Relative %: 63 %
Platelets: 285 10*3/uL (ref 150–400)
RBC: 5.66 MIL/uL (ref 4.22–5.81)
RDW: 15 % (ref 11.5–15.5)
WBC: 10.3 10*3/uL (ref 4.0–10.5)

## 2015-12-23 LAB — URINALYSIS, ROUTINE W REFLEX MICROSCOPIC
Glucose, UA: NEGATIVE mg/dL
Ketones, ur: 15 mg/dL — AB
Nitrite: POSITIVE — AB
PH: 7.5 (ref 5.0–8.0)
Protein, ur: >300 mg/dL — AB
SPECIFIC GRAVITY, URINE: 1.012 (ref 1.005–1.030)

## 2015-12-23 LAB — URINE MICROSCOPIC-ADD ON

## 2015-12-23 MED ORDER — CEPHALEXIN 500 MG PO CAPS: 500.0000 mg | ORAL_CAPSULE | Freq: Two times a day (BID) | ORAL | Status: DC

## 2015-12-23 NOTE — ED Provider Notes (Signed)
CSN: [id number]     Arrival date & time 12/23/15  1134 History   First MD Initiated Contact with Patient 12/23/15 1421     Chief Complaint  Patient presents with  . Hematuria   HPI  Mr. [name] is a 64 year old male with PMHx of HTN, DM, CVA, brain tumor, chronic pain, alcohol abuse and CHF presenting with hematuria. Pt reports that he noted dark brown urine yesterday. He states that when he urinated today it was bright red. He reports associated symptoms of dysuria and increased frequency x 1 day. He reports a history of similar presentations of his UTIs. Most recent UTI was 07/2015. Denies fevers, chills, headaches, dizziness, syncope, chest pain, SOB, abdominal pain, penile discharge, penile pain, testicular pain or flank pain. Chart review shows he has been given urologist referral information but he states he has never followed up with [facility].   Past Medical History  Diagnosis Date  . Hypertension   . Diabetes mellitus   . Brain tumor ([place])   . CVA (cerebral vascular accident) ([place])   . Narcotic abuse   . TIA (transient ischemic attack) 09/06/2012  . Vertigo 09/06/2012  . HTN (hypertension), malignant 09/06/2012  . Tobacco use disorder 12/10/2013  . Alcohol dependence ([place]) 12/10/2013  . Angioedema of lips 12/24/2013  . Chronic headache   . Chronic back pain   . CHF (congestive heart failure) (H[facility])     diastolic  . Normal cardiac stress test 2009   Past Surgical History  Procedure Laterality Date  . No past surgeries     Family History  Problem Relation Age of Onset  . CAD Father     "heart attack in his 18's"  . Hypertension Father   . Diabetes Brother   . Diabetes Sister   . Hypertension Mother    Social History  Substance Use Topics  . Smoking status: Current Every Day Smoker -- 0.50 packs/day    Types: Cigarettes  . Smokeless tobacco: Current User  . Alcohol Use: 0.0 oz/week    0 Standard drinks or equivalent per week     Comment: 6 pack a week    Review of  Systems  Constitutional: Negative for fever and chills.  Gastrointestinal: Negative for nausea, vomiting and abdominal pain.  Genitourinary: Positive for dysuria, frequency and hematuria. Negative for flank pain, discharge, penile pain and testicular pain.  All other systems reviewed and are negative.     Allergies  Review of patient's allergies indicates no known allergies.  Home Medications   Prior to Admission medications   Medication Sig Start Date End Date Taking? Authorizing Provider  acetaminophen (TYLENOL) 500 MG tablet Take 500 mg by mouth every 6 (six) hours as needed for headache.   Yes Historical Provider, MD  amLODipine (NORVASC) 10 MG tablet Take 10 mg by mouth daily.   Yes Historical Provider, MD  aspirin EC 81 MG tablet Take 81 mg by mouth daily.   Yes Historical Provider, MD  b complex vitamins tablet Take 1 tablet by mouth daily.   Yes Historical Provider, MD  hydrALAZINE (APRESOLINE) 100 MG tablet Take 1 tablet (100 mg total) by mouth every 8 (eight) hours. 01/30/15  Yes Reyne Dumas, MD  isosorbide dinitrate (ISORDIL) 10 MG tablet Take 1 tablet (10 mg total) by mouth 3 (three) times daily. 12/27/13  Yes Debbe Odea, MD  metFORMIN (GLUCOPHAGE) 500 MG tablet Take 500 mg by mouth 2 (two) times daily with a meal.   Yes Historical  Provider, MD  ondansetron (ZOFRAN ODT) 4 MG disintegrating tablet 4mg  ODT q4 hours prn nausea/vomit 07/02/15  Yes [name], MD  pantoprazole (PROTONIX) 20 MG tablet Take 1 tablet (20 mg total) by mouth daily. 07/02/15  Yes [name], MD  pravastatin (PRAVACHOL) 20 MG tablet Take 20 mg by mouth at bedtime.   Yes Historical Provider, MD  tamsulosin (FLOMAX) 0.4 MG CAPS capsule Take 1 capsule (0.4 mg total) by mouth daily. 08/06/15  Yes [name], PA-C  cephALEXin (KEFLEX) 500 MG capsule Take 1 capsule (500 mg total) by mouth 4 (four) times daily. 08/06/15   [name], PA-C  cephALEXin (KEFLEX) 500 MG capsule Take 1 capsule (500 mg  total) by mouth 2 (two) times daily. 12/23/15   Talynn Lebon, PA-C  nicotine (NICODERM CQ - DOSED IN MG/24 HOURS) 21 mg/24hr patch Place 1 patch (21 mg total) onto the skin daily. 01/30/15   Reyne Dumas, MD   BP 154/67 mmHg  Pulse 59  Temp(Src) 98.4 F (36.9 C) (Oral)  Resp 16  SpO2 98% Physical Exam  Constitutional: He appears well-developed and well-nourished. No distress.  Nontoxic appearing  HENT:  Head: Normocephalic and atraumatic.  Eyes: Conjunctivae and EOM are normal. Right eye exhibits no discharge. Left eye exhibits no discharge. No scleral icterus.  Neck: Normal range of motion.  Cardiovascular: Normal rate and regular rhythm.   Pulmonary/Chest: Effort normal. No respiratory distress.  Abdominal: He exhibits no distension. There is tenderness in the suprapubic area. There is no rigidity, no rebound and no guarding.  Mild suprapubic tenderness without peritoneal signs. No CVA tenderness.   Musculoskeletal: Normal range of motion. He exhibits no edema.  Moves all extremities spontaneously.   Neurological: He is alert. Coordination normal.  Skin: Skin is warm and dry.  Psychiatric: He has a normal mood and affect. His behavior is normal.  Nursing note and vitals reviewed.   ED Course  Procedures (including critical care time) Labs Review Labs Reviewed  URINALYSIS, ROUTINE W REFLEX MICROSCOPIC (NOT AT [facility]) - Abnormal; Notable for the following:    Color, Urine RED (*)    APPearance CLOUDY (*)    Hgb urine dipstick LARGE (*)    Bilirubin Urine MODERATE (*)    Ketones, ur 15 (*)    Protein, ur >300 (*)    Nitrite POSITIVE (*)    Leukocytes, UA MODERATE (*)    All other components within normal limits  URINE MICROSCOPIC-ADD ON - Abnormal; Notable for the following:    Squamous Epithelial / LPF 0-5 (*)    Bacteria, UA MANY (*)    All other components within normal limits  CBC WITH DIFFERENTIAL/PLATELET - Abnormal; Notable for the following:    MCV 75.3 (*)    MCH  24.0 (*)    All other components within normal limits    Imaging Review No results found. I have personally reviewed and evaluated these images and lab results as part of my medical decision-making.   EKG Interpretation None      MDM   Final diagnoses:  Hemorrhagic cystitis   Pt presenting with hematuria, dysuria and suprapubic discomfort. VSS. Abdomen is soft with mild suprapubic tenderness. No CVA tenderness or peritoneal signs. UA positive for Hgb, leukocytes, nitrites and bacteria. Pt has been diagnosed with a UTI. Will discharge with keflex and instructions to follow up with urology. Return precautions given in discharge paperwork and discussed with pt at bedside. Pt stable for discharge  Josephina Gip, PA-C 12/25/15 Gamaliel, MD 12/26/15 [phone]

## 2015-12-23 NOTE — Discharge Instructions (Signed)
Call [facility] to schedule a follow up appointment.    Urinary Tract Infection Urinary tract infections (UTIs) can develop anywhere along your urinary tract. Your urinary tract is your body's drainage system for removing wastes and extra water. Your urinary tract includes two kidneys, two ureters, a bladder, and a urethra. Your kidneys are a pair of bean-shaped organs. Each kidney is about the size of your fist. They are located below your ribs, one on each side of your spine. CAUSES Infections are caused by microbes, which are microscopic organisms, including fungi, viruses, and bacteria. These organisms are so small that they can only be seen through a microscope. Bacteria are the microbes that most commonly cause UTIs. SYMPTOMS  Symptoms of UTIs may vary by age and gender of the patient and by the location of the infection. Symptoms in young women typically include a frequent and intense urge to urinate and a painful, burning feeling in the bladder or urethra during urination. Older women and men are more likely to be tired, shaky, and weak and have muscle aches and abdominal pain. A fever may mean the infection is in your kidneys. Other symptoms of a kidney infection include pain in your back or sides below the ribs, nausea, and vomiting. DIAGNOSIS To diagnose a UTI, your caregiver will ask you about your symptoms. Your caregiver will also ask you to provide a urine sample. The urine sample will be tested for bacteria and white blood cells. White blood cells are made by your body to help fight infection. TREATMENT  Typically, UTIs can be treated with medication. Because most UTIs are caused by a bacterial infection, they usually can be treated with the use of antibiotics. The choice of antibiotic and length of treatment depend on your symptoms and the type of bacteria causing your infection. HOME CARE INSTRUCTIONS  If you were prescribed antibiotics, take them exactly as your caregiver  instructs you. Finish the medication even if you feel better after you have only taken some of the medication.  Drink enough water and fluids to keep your urine clear or pale yellow.  Avoid caffeine, tea, and carbonated beverages. They tend to irritate your bladder.  Empty your bladder often. Avoid holding urine for long periods of time.  Empty your bladder before and after sexual intercourse.  After a bowel movement, women should cleanse from front to back. Use each tissue only once. SEEK MEDICAL CARE IF:   You have back pain.  You develop a fever.  Your symptoms do not begin to resolve within 3 days. SEEK IMMEDIATE MEDICAL CARE IF:   You have severe back pain or lower abdominal pain.  You develop chills.  You have nausea or vomiting.  You have continued burning or discomfort with urination. MAKE SURE YOU:   Understand these instructions.  Will watch your condition.  Will get help right away if you are not doing well or get worse.   This information is not intended to replace advice given to you by your health care provider. Make sure you discuss any questions you have with your health care provider.   Document Released: 09/24/2005 Document Revised: 09/05/2015 Document Reviewed: 01/23/2012 Elsevier Interactive Patient Education [employer].

## 2015-12-23 NOTE — ED Notes (Signed)
Pt reports to the ED for eval of hematuria. He first noted the hematuria yesterday. Denies any flank pain. Describes the bleeding as dark brown yesterday but states that today it was bright red. Pt reports he has hx of UTI and had similar symptoms. Pt endorses dysuria and urgency. Denies any urinary frequency. Pt A&Ox4, resp e/u, and skin warm and dry.

## 2015-12-24 ENCOUNTER — Encounter (HOSPITAL_COMMUNITY): Payer: Self-pay | Admitting: Emergency Medicine

## 2015-12-24 ENCOUNTER — Emergency Department (HOSPITAL_COMMUNITY)
Admission: EM | Admit: 2015-12-24 | Discharge: 2015-12-24 | Disposition: A | Discharge status: Home / Self Care | Payer: Medicaid Other | Attending: Emergency Medicine | Admitting: Emergency Medicine

## 2015-12-24 DIAGNOSIS — Z792 Long term (current) use of antibiotics: Secondary | ICD-10-CM | POA: Diagnosis not present

## 2015-12-24 DIAGNOSIS — I509 Heart failure, unspecified: Secondary | ICD-10-CM | POA: Insufficient documentation

## 2015-12-24 DIAGNOSIS — R319 Hematuria, unspecified: Secondary | ICD-10-CM | POA: Diagnosis present

## 2015-12-24 DIAGNOSIS — F17210 Nicotine dependence, cigarettes, uncomplicated: Secondary | ICD-10-CM | POA: Diagnosis not present

## 2015-12-24 DIAGNOSIS — Z79899 Other long term (current) drug therapy: Secondary | ICD-10-CM | POA: Diagnosis not present

## 2015-12-24 DIAGNOSIS — Z7982 Long term (current) use of aspirin: Secondary | ICD-10-CM | POA: Insufficient documentation

## 2015-12-24 DIAGNOSIS — G8929 Other chronic pain: Secondary | ICD-10-CM | POA: Diagnosis not present

## 2015-12-24 DIAGNOSIS — Z8673 Personal history of transient ischemic attack (TIA), and cerebral infarction without residual deficits: Secondary | ICD-10-CM | POA: Insufficient documentation

## 2015-12-24 DIAGNOSIS — I10 Essential (primary) hypertension: Secondary | ICD-10-CM | POA: Insufficient documentation

## 2015-12-24 DIAGNOSIS — E119 Type 2 diabetes mellitus without complications: Secondary | ICD-10-CM | POA: Insufficient documentation

## 2015-12-24 DIAGNOSIS — N390 Urinary tract infection, site not specified: Secondary | ICD-10-CM

## 2015-12-24 LAB — CBC WITH DIFFERENTIAL/PLATELET
Basophils Absolute: 0.1 10*3/uL (ref 0.0–0.1)
Basophils Relative: 1 %
EOS ABS: 0.4 10*3/uL (ref 0.0–0.7)
Eosinophils Relative: 4 %
HEMATOCRIT: 39.6 % (ref 39.0–52.0)
HEMOGLOBIN: 12.7 g/dL — AB (ref 13.0–17.0)
LYMPHS ABS: 3.9 10*3/uL (ref 0.7–4.0)
LYMPHS PCT: 40 %
MCH: 24.1 pg — AB (ref 26.0–34.0)
MCHC: 32.1 g/dL (ref 30.0–36.0)
MCV: 75.3 fL — AB (ref 78.0–100.0)
MONOS PCT: 5 %
Monocytes Absolute: 0.5 10*3/uL (ref 0.1–1.0)
NEUTROS PCT: 50 %
Neutro Abs: 5 10*3/uL (ref 1.7–7.7)
Platelets: 284 10*3/uL (ref 150–400)
RBC: 5.26 MIL/uL (ref 4.22–5.81)
RDW: 15 % (ref 11.5–15.5)
WBC: 9.8 10*3/uL (ref 4.0–10.5)

## 2015-12-24 LAB — BASIC METABOLIC PANEL
Anion gap: 13 (ref 5–15)
BUN: 14 mg/dL (ref 6–20)
CHLORIDE: 105 mmol/L (ref 101–111)
CO2: 23 mmol/L (ref 22–32)
CREATININE: 1.08 mg/dL (ref 0.61–1.24)
Calcium: 9.2 mg/dL (ref 8.9–10.3)
GFR calc Af Amer: >60 mL/min (ref >60)
GFR calc non Af Amer: >60 mL/min (ref >60)
GLUCOSE: 128 mg/dL — AB (ref 65–99)
POTASSIUM: 3.6 mmol/L (ref 3.5–5.1)
SODIUM: 141 mmol/L (ref 135–145)

## 2015-12-24 MED ORDER — DEXTROSE 5 % IV SOLN
1.0000 g | Freq: Once | INTRAVENOUS | Status: AC
  Administered 2015-12-24: 1 g via INTRAVENOUS
  Filled 2015-12-24: qty 10

## 2015-12-24 MED ORDER — SODIUM CHLORIDE 0.9 % IV BOLUS (SEPSIS)
1000.0000 mL | Freq: Once | INTRAVENOUS | Status: AC
  Administered 2015-12-24: 1000 mL via INTRAVENOUS

## 2015-12-24 NOTE — ED Provider Notes (Signed)
MSE was initiated and I personally evaluated the patient and placed orders (if any) at  5:33 AM on December 24, 2015.  The patient appears stable so that the remainder of the MSE may be completed by another provider.  Patient presents with persistent hematuria and dysuria. Was seen and evaluated yesterday and diagnosed with urinary tract infection. Did not receive antibiotics in the ER. Reports that he was unable to get his antibiotics filled as an outpatient. Denies any fevers but reports left flank pain. Nontoxic on exam. Afebrile. Repeat urinalysis with urine culture and CBC and BMP will be obtained. Patient given IV Rocephin.  Merryl Hacker, MD 12/24/15 [phone]

## 2015-12-24 NOTE — ED Provider Notes (Signed)
CSN: [id number]     Arrival date & time 12/24/15  0320 History   First MD Initiated Contact with Patient 12/24/15 0530     Chief Complaint  Patient presents with  . Hematuria     (Consider location/radiation/quality/duration/timing/severity/associated sxs/prior Treatment) HPI  This a 64 year old male with history hypertension, diabetes, narcotic abuse who presents with persistent dysuria and hematuria. Develop symptoms on Friday. Was seen and evaluated in the ER yesterday and found to have a urinary tract infection. He was given a prescription but was unable to have this filled. He has persistent symptoms. Also reports left lower back pain. Denies any fevers.  Past Medical History  Diagnosis Date  . Hypertension   . Diabetes mellitus   . Brain tumor ([place])   . CVA (cerebral vascular accident) ([place])   . Narcotic abuse   . TIA (transient ischemic attack) 09/06/2012  . Vertigo 09/06/2012  . HTN (hypertension), malignant 09/06/2012  . Tobacco use disorder 12/10/2013  . Alcohol dependence ([place]) 12/10/2013  . Angioedema of lips 12/24/2013  . Chronic headache   . Chronic back pain   . CHF (congestive heart failure) ([facility])     diastolic  . Normal cardiac stress test 2009   Past Surgical History  Procedure Laterality Date  . No past surgeries     Family History  Problem Relation Age of Onset  . CAD Father     "heart attack in his 63's"  . Hypertension Father   . Diabetes Brother   . Diabetes Sister   . Hypertension Mother    Social History  Substance Use Topics  . Smoking status: Current Every Day Smoker -- 0.50 packs/day    Types: Cigarettes  . Smokeless tobacco: Current User  . Alcohol Use: 0.0 oz/week    0 Standard drinks or equivalent per week     Comment: 6 pack a week    Review of Systems  Constitutional: Negative for fever.  Respiratory: Negative for chest tightness and shortness of breath.   Gastrointestinal: Negative for nausea, vomiting and abdominal pain.   Genitourinary: Positive for dysuria, hematuria and flank pain.  Skin: Negative for color change.  All other systems reviewed and are negative.     Allergies  Review of patient's allergies indicates no known allergies.  Home Medications   Prior to Admission medications   Medication Sig Start Date End Date Taking? Authorizing Provider  acetaminophen (TYLENOL) 500 MG tablet Take 500 mg by mouth every 6 (six) hours as needed for headache.    Historical Provider, MD  amLODipine (NORVASC) 10 MG tablet Take 10 mg by mouth daily.    Historical Provider, MD  aspirin EC 81 MG tablet Take 81 mg by mouth daily.    Historical Provider, MD  b complex vitamins tablet Take 1 tablet by mouth daily.    Historical Provider, MD  cephALEXin (KEFLEX) 500 MG capsule Take 1 capsule (500 mg total) by mouth 4 (four) times daily. 08/06/15   [name], PA-C  cephALEXin (KEFLEX) 500 MG capsule Take 1 capsule (500 mg total) by mouth 2 (two) times daily. 12/23/15   Stevi Barrett, PA-C  hydrALAZINE (APRESOLINE) 100 MG tablet Take 1 tablet (100 mg total) by mouth every 8 (eight) hours. 01/30/15   Reyne Dumas, MD  isosorbide dinitrate (ISORDIL) 10 MG tablet Take 1 tablet (10 mg total) by mouth 3 (three) times daily. 12/27/13   Debbe Odea, MD  metFORMIN (GLUCOPHAGE) 500 MG tablet Take 500 mg by mouth 2 (two)  times daily with a meal.    Historical Provider, MD  nicotine (NICODERM CQ - DOSED IN MG/24 HOURS) 21 mg/24hr patch Place 1 patch (21 mg total) onto the skin daily. 01/30/15   Reyne Dumas, MD  ondansetron (ZOFRAN ODT) 4 MG disintegrating tablet 4mg  ODT q4 hours prn nausea/vomit 07/02/15   [name], MD  pantoprazole (PROTONIX) 20 MG tablet Take 1 tablet (20 mg total) by mouth daily. 07/02/15   [name], MD  pravastatin (PRAVACHOL) 20 MG tablet Take 20 mg by mouth at bedtime.    Historical Provider, MD  tamsulosin (FLOMAX) 0.4 MG CAPS capsule Take 1 capsule (0.4 mg total) by mouth daily. 08/06/15   Tiffany  Carlota Raspberry, PA-C   BP 126/52 mmHg  Pulse 88  Temp(Src) 98.2 F (36.8 C) (Oral)  Resp 16  SpO2 95% Physical Exam  Constitutional: He is oriented to person, place, and time. No distress.  HENT:  Head: Normocephalic and atraumatic.  Cardiovascular: Normal rate, regular rhythm and normal heart sounds.   No murmur heard. Pulmonary/Chest: Effort normal and breath sounds normal. No respiratory distress. He has no wheezes.  Abdominal: Soft. Bowel sounds are normal. There is no tenderness. There is no rebound.  Genitourinary:  No CVA tenderness  Musculoskeletal: He exhibits no edema.  Neurological: He is alert and oriented to person, place, and time.  Skin: Skin is warm and dry.  Psychiatric: He has a normal mood and affect.  Nursing note and vitals reviewed.   ED Course  Procedures (including critical care time) Labs Review Labs Reviewed  CBC WITH DIFFERENTIAL/PLATELET - Abnormal; Notable for the following:    Hemoglobin 12.7 (*)    MCV 75.3 (*)    MCH 24.1 (*)    All other components within normal limits  BASIC METABOLIC PANEL - Abnormal; Notable for the following:    Glucose, Bld 128 (*)    All other components within normal limits  URINE CULTURE  URINALYSIS, ROUTINE W REFLEX MICROSCOPIC (NOT AT [facility])    Imaging Review No results found. I have personally reviewed and evaluated these images and lab results as part of my medical decision-making.   EKG Interpretation None      MDM   Final diagnoses:  UTI (lower urinary tract infection)    Patient presents with persistent hematuria and dysuria. Has been unable to fill his antibiotics. Did not receive any antibiotics while in the emergency room yesterday. I have reviewed his chart and he had a nitrite-positive urine. Lab work obtained given flank pain. He is afebrile. Lab work is reassuring without evidence of leukocytosis. Patient was given IV Rocephin. Urine culture is now pending. Will discharge and encouraged patient to  fill Keflex as prescribed previously.  After history, exam, and medical workup I feel the patient has been appropriately medically screened and is safe for discharge home. Pertinent diagnoses were discussed with the patient. Patient was given return precautions.     Merryl Hacker, MD 12/24/15 [phone]

## 2015-12-24 NOTE — Discharge Instructions (Signed)

## 2015-12-24 NOTE — ED Notes (Signed)
C/o hematuria and dysuria since Friday.  States he was seen in ED yesterday and given Rx but unable to find drugstore that was open due to Christmas Holiday.

## 2015-12-26 LAB — URINE CULTURE

## 2015-12-27 ENCOUNTER — Telehealth (HOSPITAL_COMMUNITY): Payer: Self-pay

## 2015-12-27 NOTE — Telephone Encounter (Signed)
Returned call. Medication called to [facility] at the corner of [address]. [phone]

## 2015-12-27 NOTE — Telephone Encounter (Signed)
Post ED Visit - Positive Culture Follow-up: Successful Patient Follow-Up  Culture assessed and recommendations reviewed by: []  Elenor Quinones, Pharm.D. []  Heide Guile, Pharm.D., BCPS [x]  Parks Neptune, Pharm.D. []  Alycia Rossetti, Pharm.D., BCPS []  [place], Pharm.D., BCPS, AAHIVP []  Legrand Como, Pharm.D., BCPS, AAHIVP []  Milus Glazier, Pharm.D. []  Stephens November, Pharm.D.  Positive urine culture  []  Patient discharged without antimicrobial prescription and treatment is now indicated [x]  Organism is resistant to prescribed ED discharge antimicrobial []  Patient with positive blood cultures  Changes discussed with ED provider: Delos Haring PA New antibiotic prescription DC cephalexin and start amoxicillin 500mg  po bid x 1 week   Attempted to contact pt. Letter sent to address on file.  Ileene Musa 12/27/2015, 9:25 AM

## 2015-12-27 NOTE — Progress Notes (Signed)
ED Antimicrobial Stewardship Positive Culture Follow Up   [name] is an 64 y.o. male who presented to [facility] on 12/24/2015 with a chief complaint of  Chief Complaint  Patient presents with  . Hematuria    Recent Results (from the past 720 hour(s))  Urine culture     Status: None   Collection Time: 12/23/15 11:50 AM  Result Value Ref Range Status   Specimen Description URINE, CLEAN CATCH  Final   Special Requests ADDED [id number] [phone]  Final   Culture >=100,000 COLONIES/mL ENTEROCOCCUS SPECIES  Final   Report Status 12/26/2015 FINAL  Final   Organism ID, Bacteria ENTEROCOCCUS SPECIES  Final      Susceptibility   Enterococcus species - MIC*    AMPICILLIN <=2 SENSITIVE Sensitive     LEVOFLOXACIN 1 SENSITIVE Sensitive     NITROFURANTOIN <=16 SENSITIVE Sensitive     VANCOMYCIN 1 SENSITIVE Sensitive     * >=100,000 COLONIES/mL ENTEROCOCCUS SPECIES    [x]  Treated with cephalexin, organism resistant to prescribed antimicrobial []  Patient discharged originally without antimicrobial agent and treatment is now indicated  New antibiotic prescription: Amoxicillin 500 mg BID x 1 week  ED Provider: Delos Haring, PA-C  Wynell Balloon 12/27/2015, 7:54 AM Infectious Diseases Pharmacist Phone# [phone]

## 2016-01-02 ENCOUNTER — Encounter (HOSPITAL_COMMUNITY): Payer: Self-pay

## 2016-01-02 ENCOUNTER — Emergency Department (HOSPITAL_COMMUNITY)
Admission: EM | Admit: 2016-01-02 | Discharge: 2016-01-03 | Disposition: A | Discharge status: Home / Self Care | Payer: Medicaid Other | Attending: Emergency Medicine | Admitting: Emergency Medicine

## 2016-01-02 ENCOUNTER — Emergency Department (HOSPITAL_COMMUNITY): Payer: Medicaid Other

## 2016-01-02 DIAGNOSIS — J159 Unspecified bacterial pneumonia: Secondary | ICD-10-CM | POA: Insufficient documentation

## 2016-01-02 DIAGNOSIS — Z86011 Personal history of benign neoplasm of the brain: Secondary | ICD-10-CM | POA: Diagnosis not present

## 2016-01-02 DIAGNOSIS — Z8673 Personal history of transient ischemic attack (TIA), and cerebral infarction without residual deficits: Secondary | ICD-10-CM | POA: Insufficient documentation

## 2016-01-02 DIAGNOSIS — Z7984 Long term (current) use of oral hypoglycemic drugs: Secondary | ICD-10-CM | POA: Diagnosis not present

## 2016-01-02 DIAGNOSIS — Z792 Long term (current) use of antibiotics: Secondary | ICD-10-CM | POA: Insufficient documentation

## 2016-01-02 DIAGNOSIS — Z79899 Other long term (current) drug therapy: Secondary | ICD-10-CM | POA: Diagnosis not present

## 2016-01-02 DIAGNOSIS — F17210 Nicotine dependence, cigarettes, uncomplicated: Secondary | ICD-10-CM | POA: Diagnosis not present

## 2016-01-02 DIAGNOSIS — I509 Heart failure, unspecified: Secondary | ICD-10-CM | POA: Diagnosis not present

## 2016-01-02 DIAGNOSIS — J189 Pneumonia, unspecified organism: Secondary | ICD-10-CM

## 2016-01-02 DIAGNOSIS — Z7982 Long term (current) use of aspirin: Secondary | ICD-10-CM | POA: Diagnosis not present

## 2016-01-02 DIAGNOSIS — R0602 Shortness of breath: Secondary | ICD-10-CM | POA: Diagnosis present

## 2016-01-02 DIAGNOSIS — G8929 Other chronic pain: Secondary | ICD-10-CM | POA: Insufficient documentation

## 2016-01-02 DIAGNOSIS — R011 Cardiac murmur, unspecified: Secondary | ICD-10-CM | POA: Insufficient documentation

## 2016-01-02 DIAGNOSIS — I10 Essential (primary) hypertension: Secondary | ICD-10-CM | POA: Insufficient documentation

## 2016-01-02 DIAGNOSIS — E119 Type 2 diabetes mellitus without complications: Secondary | ICD-10-CM | POA: Diagnosis not present

## 2016-01-02 DIAGNOSIS — M940 Chondrocostal junction syndrome [Tietze]: Secondary | ICD-10-CM | POA: Insufficient documentation

## 2016-01-02 LAB — CBC
HEMATOCRIT: 40.5 % (ref 39.0–52.0)
Hemoglobin: 13.1 g/dL (ref 13.0–17.0)
MCH: 23.9 pg — ABNORMAL LOW (ref 26.0–34.0)
MCHC: 32.3 g/dL (ref 30.0–36.0)
MCV: 74 fL — ABNORMAL LOW (ref 78.0–100.0)
Platelets: 237 10*3/uL (ref 150–400)
RBC: 5.47 MIL/uL (ref 4.22–5.81)
RDW: 14.7 % (ref 11.5–15.5)
WBC: 8.9 10*3/uL (ref 4.0–10.5)

## 2016-01-02 LAB — I-STAT TROPONIN, ED: TROPONIN I, POC: 0 ng/mL (ref 0.00–0.08)

## 2016-01-02 MED ORDER — ALBUTEROL SULFATE (2.5 MG/3ML) 0.083% IN NEBU
5.0000 mg | INHALATION_SOLUTION | Freq: Once | RESPIRATORY_TRACT | Status: AC
  Administered 2016-01-03: 5 mg via RESPIRATORY_TRACT
  Filled 2016-01-02: qty 6

## 2016-01-02 NOTE — ED Provider Notes (Signed)
CSN: [id number]     Arrival date & time 01/02/16  2319 History  By signing my name below, I, Evelene Croon, attest that this documentation has been prepared under the direction and in the presence of [name], MD . Electronically Signed: Evelene Croon, Scribe. 01/03/2016. 3:23 AM.      Chief Complaint  Patient presents with  . Shortness of Breath    The history is provided by the patient. No language interpreter was used.   HPI Comments:  [name] is a 65 y.o. male with a history of HTN, DM, TIA, CVA, and CHF, who presents to the Emergency Department complaining of constant SOB x ~ 1 hr. He reports associated mild dizziness, dry cough x 1 day, and central sharp CP that radiates into his left chest and left shoulder. He notes CP accompanied SOB. He denies fever, nausea, and diaphoresis. Pt also denies h/o heart and liver disease, and h/o heart catherterization. His last stress test was ~ 5 years ago. No alleviating factors noted. Pt smokes ~ 0.5 pack per day. 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   Past Medical History  Diagnosis Date  . Hypertension   . Diabetes mellitus   . Brain tumor ([place])   . CVA (cerebral vascular accident) ([place])   . Narcotic abuse   . TIA (transient ischemic attack) 09/06/2012  . Vertigo 09/06/2012  . HTN (hypertension), malignant 09/06/2012  . Tobacco use disorder 12/10/2013  . Alcohol dependence ([place]) 12/10/2013  . Angioedema of lips 12/24/2013  . Chronic headache   . Chronic back pain   . CHF (congestive heart failure) ([facility])     diastolic  . Normal cardiac stress test 2009   Past Surgical History  Procedure Laterality Date  . No past surgeries     Family History  Problem Relation Age of Onset  . CAD Father     "heart attack in his 37's"  . Hypertension Father   . Diabetes Brother   . Diabetes Sister   . Hypertension Mother    Social History  Substance Use Topics  . Smoking status: Current Every Day Smoker -- 0.50  packs/day    Types: Cigarettes  . Smokeless tobacco: Current User  . Alcohol Use: 0.0 oz/week    0 Standard drinks or equivalent per week     Comment: 6 pack a week    Review of Systems  10 systems reviewed and all are negative for acute change except as noted in the HPI.  Allergies  Review of patient's allergies indicates no known allergies.  Home Medications   Prior to Admission medications   Medication Sig Start Date End Date Taking? Authorizing Provider  acetaminophen (TYLENOL) 500 MG tablet Take 500 mg by mouth every 6 (six) hours as needed for headache.    Historical Provider, MD  amLODipine (NORVASC) 10 MG tablet Take 10 mg by mouth daily.    Historical Provider, MD  aspirin EC 81 MG tablet Take 81 mg by mouth daily.    Historical Provider, MD  azithromycin (ZITHROMAX) 250 MG tablet Take 1 tab daily for for days 01/03/16   [name], MD  b complex vitamins tablet Take 1 tablet by mouth daily.    Historical Provider, MD  cefdinir (OMNICEF) 300 MG capsule Take 1 capsule (300 mg total) by mouth 2 (two) times daily. 01/03/16   [name], MD  cephALEXin (KEFLEX) 500 MG capsule Take 1 capsule (500 mg total) by mouth 4 (four)  times daily. 08/06/15   [name], PA-C  cephALEXin (KEFLEX) 500 MG capsule Take 1 capsule (500 mg total) by mouth 2 (two) times daily. 12/23/15   Stevi Barrett, PA-C  hydrALAZINE (APRESOLINE) 100 MG tablet Take 1 tablet (100 mg total) by mouth every 8 (eight) hours. 01/30/15   Reyne Dumas, MD  isosorbide dinitrate (ISORDIL) 10 MG tablet Take 1 tablet (10 mg total) by mouth 3 (three) times daily. 12/27/13   Debbe Odea, MD  metFORMIN (GLUCOPHAGE) 500 MG tablet Take 500 mg by mouth 2 (two) times daily with a meal.    Historical Provider, MD  naproxen (NAPROSYN) 375 MG tablet Take 1 tablet (375 mg total) by mouth 2 (two) times daily. 01/03/16   [name], MD  nicotine (NICODERM CQ - DOSED IN MG/24 HOURS) 21 mg/24hr patch Place 1 patch (21 mg total) onto  the skin daily. 01/30/15   Reyne Dumas, MD  ondansetron (ZOFRAN ODT) 4 MG disintegrating tablet 4mg  ODT q4 hours prn nausea/vomit 07/02/15   [name], MD  pantoprazole (PROTONIX) 20 MG tablet Take 1 tablet (20 mg total) by mouth daily. 07/02/15   [name], MD  pravastatin (PRAVACHOL) 20 MG tablet Take 20 mg by mouth at bedtime.    Historical Provider, MD  tamsulosin (FLOMAX) 0.4 MG CAPS capsule Take 1 capsule (0.4 mg total) by mouth daily. 08/06/15   [name], PA-C   BP 167/76 mmHg  Pulse 73  Temp(Src) 98.7 F (37.1 C) (Oral)  Resp 18  Ht 5\' 9"  (1.753 m)  Wt 165 lb 9.6 oz (75.116 kg)  BMI 24.44 kg/m2  SpO2 93% Physical Exam  Constitutional: He is oriented to person, place, and time. He appears well-developed and well-nourished. No distress.  HENT:  Head: Normocephalic and atraumatic.  Eyes: Conjunctivae are normal.  Cardiovascular: Normal rate.   Murmur (Pansystolic) heard. Pulses:      Radial pulses are 2+ on the right side, and 1+ on the left side.       Femoral pulses are 2+ on the right side, and 2+ on the left side.      Dorsalis pedis pulses are 2+ on the right side, and 2+ on the left side.  Pulmonary/Chest: Effort normal. He has wheezes. He exhibits tenderness.  Mild inspiratory wheezing in bibasilar lungs. Chest wall is tender to even the slightest of palpation  Abdominal: He exhibits no distension.  Neurological: He is alert and oriented to person, place, and time.  Skin: Skin is warm and dry. He is not diaphoretic.  Psychiatric: He has a normal mood and affect.  Nursing note and vitals reviewed.   ED Course  Procedures   DIAGNOSTIC STUDIES:  Oxygen Saturation is 95% on RA, normal by my interpretation.    COORDINATION OF CARE:  12:30 AM Discussed treatment plan with pt at bedside and pt agreed to plan.  Labs Review Labs Reviewed  BASIC METABOLIC PANEL - Abnormal; Notable for the following:    Glucose, Bld 105 (*)    All other components  within normal limits  CBC - Abnormal; Notable for the following:    MCV 74.0 (*)    MCH 23.9 (*)    All other components within normal limits  BRAIN NATRIURETIC PEPTIDE  I-STAT TROPOININ, ED  Randolm Idol, ED    Imaging Review Dg Chest 2 View  01/03/2016  CLINICAL DATA:  Acute onset of cough and mid chest pain. Initial encounter. EXAM: CHEST  2 VIEW COMPARISON:  Chest radiograph performed  03/18/2015 FINDINGS: The lungs are well-aerated. Right basilar airspace opacity is concerning for pneumonia. There is no evidence of pleural effusion or pneumothorax. The heart is normal in size; the mediastinal contour is within normal limits. No acute osseous abnormalities are seen. IMPRESSION: Right basilar pneumonia noted. Electronically Signed   By: Garald Balding M.D.   On: 01/03/2016 00:04   I have personally reviewed and evaluated these images and lab results as part of my medical decision-making.   EKG Interpretation   Date/Time:  Wednesday January 02 2016 23:38:23 EST Ventricular Rate:  76 PR Interval:  156 QRS Duration: 84 QT Interval:  402 QTC Calculation: 452 R Axis:   1 Text Interpretation:  Normal sinus rhythm Normal ECG  elevation in v2 and  v3 without reciprocal depressions Elevation are new compared to prior Poor  R wave progression Confirmed by Kathrynn Humble, MD, Sigifredo Pignato [phone]) on 01/02/2016  11:39:25 PM      EKG Interpretation  Date/Time:  Thursday January 03 2016 00:24:29 EST Ventricular Rate:  74 PR Interval:  156 QRS Duration: 82 QT Interval:  419 QTC Calculation: 465 R Axis:   21 Text Interpretation:  Sinus rhythm Probable left atrial enlargement Probable anteroseptal infarct, recent unchanged from prior ekg Confirmed by Trentin Knappenberger, MD, Jakub Debold ([id number]) on 01/03/2016 12:37:17 AM       3:17 AM Pt reassessed, pain has improved but pt is still experiencing mild reproducible CP. Second trop sent.   MDM   Final diagnoses:  Costochondritis, acute  CAP (community acquired  pneumonia)    I personally performed the services described in this documentation, which was scribed in my presence. The recorded information has been reviewed and is accurate.   Pt comes in with cc of dib. PT reports cough and chest pain that is central.  Differential diagnosis includes: ACS syndrome CHF exacerbation Valvular disorder Myocarditis Pericarditis Pericardial effusion Pneumonia Pleural effusion Pulmonary edema PE Musculoskeletal pain   Pt's chest pain is reproducible with palpation. Even putting my stethoscope to his chest is significantly uncomfortable. Based on that - chest wall tenderness/ costochondritis is higher on the ddx than ACS. Pt denies any exertional component. His pulses are equal distally and pt has noneurologic complains and labs are reassuring  - which makes dissection also less likely. We will get trop x 2. Pain has been constant for > 6 hours already. HEAR score is 3 for age and risk factors.  Pt also has cough. He has no right sided chest pain, but his pain is a bit pleuritic. Pt has no hx of PE, DVT and denies any exogenous estrogen use, long distance travels or surgery in the past 6 weeks, active cancer, recent immobilization. Pt has infiltrate on Xray on the right side. Labs are reassuring. CAP is likely. Pulm infarct considered, but with no right sided pain and normal VSS, CT chest is not mandated. Repeat eval has been requested for this xray pathology - and pt states that he will try to see his pcp in 10 days.  Strict return precautions discussed.  [name], MD 01/03/16 2351

## 2016-01-02 NOTE — ED Notes (Signed)
Pt here for SOB that started tonight. Denies any hx of asthma or COPD. States his head hurts.

## 2016-01-03 ENCOUNTER — Encounter (HOSPITAL_COMMUNITY): Payer: Self-pay | Admitting: Emergency Medicine

## 2016-01-03 LAB — BASIC METABOLIC PANEL
ANION GAP: 12 (ref 5–15)
BUN: 10 mg/dL (ref 6–20)
CALCIUM: 9.4 mg/dL (ref 8.9–10.3)
CO2: 25 mmol/L (ref 22–32)
CREATININE: 1.05 mg/dL (ref 0.61–1.24)
Chloride: 101 mmol/L (ref 101–111)
GFR calc Af Amer: >60 mL/min (ref >60)
GLUCOSE: 105 mg/dL — AB (ref 65–99)
Potassium: 3.9 mmol/L (ref 3.5–5.1)
Sodium: 138 mmol/L (ref 135–145)

## 2016-01-03 LAB — BRAIN NATRIURETIC PEPTIDE: B NATRIURETIC PEPTIDE 5: 64.2 pg/mL (ref 0.0–100.0)

## 2016-01-03 LAB — I-STAT TROPONIN, ED: Troponin i, poc: 0.01 ng/mL (ref 0.00–0.08)

## 2016-01-03 MED ORDER — KETOROLAC TROMETHAMINE 15 MG/ML IJ SOLN
15.0000 mg | Freq: Once | INTRAMUSCULAR | Status: AC
  Administered 2016-01-03: 15 mg via INTRAVENOUS
  Filled 2016-01-03: qty 1

## 2016-01-03 MED ORDER — AZITHROMYCIN 250 MG PO TABS: ORAL_TABLET | ORAL | Status: DC

## 2016-01-03 MED ORDER — CEFDINIR 300 MG PO CAPS: 300.0000 mg | ORAL_CAPSULE | Freq: Two times a day (BID) | ORAL | Status: DC

## 2016-01-03 MED ORDER — AZITHROMYCIN 250 MG PO TABS
500.0000 mg | ORAL_TABLET | Freq: Once | ORAL | Status: AC
  Administered 2016-01-03: 500 mg via ORAL
  Filled 2016-01-03: qty 2

## 2016-01-03 MED ORDER — DEXTROSE 5 % IV SOLN
1.0000 g | Freq: Once | INTRAVENOUS | Status: AC
  Administered 2016-01-03: 1 g via INTRAVENOUS
  Filled 2016-01-03: qty 10

## 2016-01-03 MED ORDER — NAPROXEN 375 MG PO TABS: 375.0000 mg | ORAL_TABLET | Freq: Two times a day (BID) | ORAL | Status: DC

## 2016-01-03 NOTE — ED Notes (Signed)
MD at bedside. 

## 2016-01-03 NOTE — ED Notes (Signed)
EDP aware of pt BP. Currently at bedside assessing pt

## 2016-01-03 NOTE — Discharge Instructions (Signed)
Community-Acquired Pneumonia, Adult °Pneumonia is an infection of the lungs. There are different types of pneumonia. One type can develop while a person is in a hospital. A different type, called community-acquired pneumonia, develops in people who are not, or have not recently been, in the hospital or other health care facility.  °CAUSES °Pneumonia may be caused by bacteria, viruses, or funguses. Community-acquired pneumonia is often caused by Streptococcus pneumonia bacteria. These bacteria are often passed from one person to another by breathing in droplets from the cough or sneeze of an infected person. °RISK FACTORS °The condition is more likely to develop in: °· People who have chronic diseases, such as chronic obstructive pulmonary disease (COPD), asthma, congestive heart failure, cystic fibrosis, diabetes, or kidney disease. °· People who have early-stage or late-stage HIV. °· People who have sickle cell disease. °· People who have had their spleen removed (splenectomy). °· People who have poor dental hygiene. °· People who have medical conditions that increase the risk of breathing in (aspirating) secretions their own mouth and nose.   °· People who have a weakened immune system (immunocompromised). °· People who smoke. °· People who travel to areas where pneumonia-causing germs commonly exist. °· People who are around animal habitats or animals that have pneumonia-causing germs, including birds, bats, rabbits, cats, and farm animals. °SYMPTOMS °Symptoms of this condition include: °· A dry cough. °· A wet (productive) cough. °· Fever. °· Sweating. °· Chest pain, especially when breathing deeply or coughing. °· Rapid breathing or difficulty breathing. °· Shortness of breath. °· Shaking chills. °· Fatigue. °· Muscle aches. °DIAGNOSIS °Your health care provider will take a medical history and perform a physical exam. You may also have other tests, including: °· Imaging studies of your chest, including  X-rays. °· Tests to check your blood oxygen level and other blood gases. °· Other tests on blood, mucus (sputum), fluid around your lungs (pleural fluid), and urine. °If your pneumonia is severe, other tests may be done to identify the specific cause of your illness. °TREATMENT °The type of treatment that you receive depends on many factors, such as the cause of your pneumonia, the medicines you take, and other medical conditions that you have. For most adults, treatment and recovery from pneumonia may occur at home. In some cases, treatment must happen in a hospital. Treatment may include: °· Antibiotic medicines, if the pneumonia was caused by bacteria. °· Antiviral medicines, if the pneumonia was caused by a virus. °· Medicines that are given by mouth or through an IV tube. °· Oxygen. °· Respiratory therapy. °Although rare, treating severe pneumonia may include: °· Mechanical ventilation. This is done if you are not breathing well on your own and you cannot maintain a safe blood oxygen level. °· Thoracentesis. This procedure removes fluid around one lung or both lungs to help you breathe better. °HOME CARE INSTRUCTIONS °· Take over-the-counter and prescription medicines only as told by your health care provider. °¨ Only take cough medicine if you are losing sleep. Understand that cough medicine can prevent your body's natural ability to remove mucus from your lungs. °¨ If you were prescribed an antibiotic medicine, take it as told by your health care provider. Do not stop taking the antibiotic even if you start to feel better. °· Sleep in a semi-upright position at night. Try sleeping in a reclining chair, or place a few pillows under your head. °· Do not use tobacco products, including cigarettes, chewing tobacco, and e-cigarettes. If you need help quitting, ask your health care provider. °· Drink enough water to keep your urine   clear or pale yellow. This will help to thin out mucus secretions in your  lungs. PREVENTION There are ways that you can decrease your risk of developing community-acquired pneumonia. Consider getting a pneumococcal vaccine if:  You are older than 64 years of age.  You are older than 64 years of age and are undergoing cancer treatment, have chronic lung disease, or have other medical conditions that affect your immune system. Ask your health care provider if this applies to you. There are different types and schedules of pneumococcal vaccines. Ask your health care provider which vaccination option is best for you. You may also prevent community-acquired pneumonia if you take these actions:  Get an influenza vaccine every year. Ask your health care provider which type of influenza vaccine is best for you.  Go to the dentist on a regular basis.  Wash your hands often. Use hand sanitizer if soap and water are not available. SEEK MEDICAL CARE IF:  You have a fever.  You are losing sleep because you cannot control your cough with cough medicine. SEEK IMMEDIATE MEDICAL CARE IF:  You have worsening shortness of breath.  You have increased chest pain.  Your sickness becomes worse, especially if you are an older adult or have a weakened immune system.  You cough up blood.   This information is not intended to replace advice given to you by your health care provider. Make sure you discuss any questions you have with your health care provider.   Document Released: 12/15/2005 Document Revised: 09/05/2015 Document Reviewed: 04/11/2015 Elsevier Interactive Patient Education 2016 [employer]  Chest Wall Pain Chest wall pain is pain in or around the bones and muscles of your chest. Sometimes, an injury causes this pain. Sometimes, the cause may not be known. This pain may take several weeks or longer to get better. HOME CARE INSTRUCTIONS  Pay attention to any changes in your symptoms. Take these actions to help with your pain:   Rest as told by your health care  provider.   Avoid activities that cause pain. These include any activities that use your chest muscles or your abdominal and side muscles to lift heavy items.   If directed, apply ice to the painful area:  Put ice in a plastic bag.  Place a towel between your skin and the bag.  Leave the ice on for 20 minutes, 2-3 times per day.  Take over-the-counter and prescription medicines only as told by your health care provider.  Do not use tobacco products, including cigarettes, chewing tobacco, and e-cigarettes. If you need help quitting, ask your health care provider.  Keep all follow-up visits as told by your health care provider. This is important. SEEK MEDICAL CARE IF:  You have a fever.  Your chest pain becomes worse.  You have new symptoms. SEEK IMMEDIATE MEDICAL CARE IF:  You have nausea or vomiting.  You feel sweaty or light-headed.  You have a cough with phlegm (sputum) or you cough up blood.  You develop shortness of breath.   This information is not intended to replace advice given to you by your health care provider. Make sure you discuss any questions you have with your health care provider.   Document Released: 12/15/2005 Document Revised: 09/05/2015 Document Reviewed: 03/12/2015 Elsevier Interactive Patient Education [employer].

## 2016-01-06 ENCOUNTER — Encounter (HOSPITAL_COMMUNITY): Payer: Self-pay | Admitting: Emergency Medicine

## 2016-01-06 ENCOUNTER — Emergency Department (HOSPITAL_COMMUNITY): Payer: Medicaid Other

## 2016-01-06 ENCOUNTER — Emergency Department (HOSPITAL_COMMUNITY)
Admission: EM | Admit: 2016-01-06 | Discharge: 2016-01-06 | Disposition: A | Discharge status: Home / Self Care | Payer: Medicaid Other | Attending: Emergency Medicine | Admitting: Emergency Medicine

## 2016-01-06 DIAGNOSIS — I10 Essential (primary) hypertension: Secondary | ICD-10-CM | POA: Diagnosis not present

## 2016-01-06 DIAGNOSIS — Z7982 Long term (current) use of aspirin: Secondary | ICD-10-CM | POA: Insufficient documentation

## 2016-01-06 DIAGNOSIS — I509 Heart failure, unspecified: Secondary | ICD-10-CM | POA: Insufficient documentation

## 2016-01-06 DIAGNOSIS — Z79899 Other long term (current) drug therapy: Secondary | ICD-10-CM | POA: Insufficient documentation

## 2016-01-06 DIAGNOSIS — J159 Unspecified bacterial pneumonia: Secondary | ICD-10-CM | POA: Insufficient documentation

## 2016-01-06 DIAGNOSIS — E119 Type 2 diabetes mellitus without complications: Secondary | ICD-10-CM | POA: Diagnosis not present

## 2016-01-06 DIAGNOSIS — G8929 Other chronic pain: Secondary | ICD-10-CM | POA: Insufficient documentation

## 2016-01-06 DIAGNOSIS — Z8673 Personal history of transient ischemic attack (TIA), and cerebral infarction without residual deficits: Secondary | ICD-10-CM | POA: Diagnosis not present

## 2016-01-06 DIAGNOSIS — R011 Cardiac murmur, unspecified: Secondary | ICD-10-CM | POA: Insufficient documentation

## 2016-01-06 DIAGNOSIS — F17210 Nicotine dependence, cigarettes, uncomplicated: Secondary | ICD-10-CM | POA: Insufficient documentation

## 2016-01-06 DIAGNOSIS — J189 Pneumonia, unspecified organism: Secondary | ICD-10-CM

## 2016-01-06 DIAGNOSIS — R0789 Other chest pain: Secondary | ICD-10-CM | POA: Diagnosis not present

## 2016-01-06 DIAGNOSIS — R079 Chest pain, unspecified: Secondary | ICD-10-CM | POA: Diagnosis present

## 2016-01-06 NOTE — Discharge Instructions (Signed)
Chest Wall Pain Chest wall pain is pain in or around the bones and muscles of your chest. Sometimes, an injury causes this pain. Sometimes, the cause may not be known. This pain may take several weeks or longer to get better. HOME CARE Pay attention to any changes in your symptoms. Take these actions to help with your pain:  Rest as told by your doctor.  Avoid activities that cause pain. Try not to use your chest, belly (abdominal), or side muscles to lift heavy things.  If directed, apply ice to the painful area:  Put ice in a plastic bag.  Place a towel between your skin and the bag.  Leave the ice on for 20 minutes, 2-3 times per day.  Take over-the-counter and prescription medicines only as told by your doctor.  Do not use tobacco products, including cigarettes, chewing tobacco, and e-cigarettes. If you need help quitting, ask your doctor.  Keep all follow-up visits as told by your doctor. This is important. GET HELP IF:  You have a fever.  Your chest pain gets worse.  You have new symptoms. GET HELP RIGHT AWAY IF:  You feel sick to your stomach (nauseous) or you throw up (vomit).  You feel sweaty or light-headed.  You have a cough with phlegm (sputum) or you cough up blood.  You are short of breath.   This information is not intended to replace advice given to you by your health care provider. Make sure you discuss any questions you have with your health care provider.   Document Released: 06/02/2008 Document Revised: 09/05/2015 Document Reviewed: 03/12/2015 Elsevier Interactive Patient Education 2016 [employer].  Please read attached information. If you experience any new or worsening signs or symptoms please return to the emergency room for evaluation. Please follow-up with your primary care provider or specialist as discussed. Please use medication prescribed only as directed and discontinue taking if you have any concerning signs or symptoms.

## 2016-01-06 NOTE — ED Notes (Signed)
Pt dx with pneumonia on 1/5, and having same CP at that time. States "just didn't feel good" tonight which is why he called EMS.

## 2016-01-06 NOTE — ED Provider Notes (Signed)
CSN: [id number]     Arrival date & time 01/06/16  0612 History   First MD Initiated Contact with Patient 01/06/16 [phone]     Chief Complaint  Patient presents with  . Chest Pain   HPI    65 year old male presents today for reevaluation of chest pain and community-acquired pneumonia. Patient reports that he was seen on 01/02/2016 and diagnosed with costochondritis and community acquired ammonia. He was placed on azithromycin, followed up with his primary care provider the following day and encouraged to continue antibiotics with reevaluation on 01/15/2015. Patient reports since the time of onset he has continued to have generalized anterior chest pain, worse with deep inspiration, worse with palpation or upper body movements. He denies any baseline shortness of breath but notes shortness of breath with deep inspiration due to pain. He reports cough is nonproductive, feels congested. He denies any fever, chills, nausea, vomiting, diaphoresis. Patient has no history of ACS, PE, DVT. He denies any risk factors for PE or DVT other than smoking.   Past Medical History  Diagnosis Date  . Hypertension   . Diabetes mellitus   . Brain tumor ([place])   . CVA (cerebral vascular accident) ([place])   . Narcotic abuse   . TIA (transient ischemic attack) 09/06/2012  . Vertigo 09/06/2012  . HTN (hypertension), malignant 09/06/2012  . Tobacco use disorder 12/10/2013  . Alcohol dependence ([place]) 12/10/2013  . Angioedema of lips 12/24/2013  . Chronic headache   . Chronic back pain   . CHF (congestive heart failure) ([facility])     diastolic  . Normal cardiac stress test 2009   Past Surgical History  Procedure Laterality Date  . No past surgeries     Family History  Problem Relation Age of Onset  . CAD Father     "heart attack in his 48's"  . Hypertension Father   . Diabetes Brother   . Diabetes Sister   . Hypertension Mother    Social History  Substance Use Topics  . Smoking status: Current Every Day Smoker --  0.50 packs/day    Types: Cigarettes  . Smokeless tobacco: Current User  . Alcohol Use: 0.0 oz/week    0 Standard drinks or equivalent per week     Comment: 6 pack a week    Review of Systems  All other systems reviewed and are negative.   Allergies  Review of patient's allergies indicates no known allergies.  Home Medications   Prior to Admission medications   Medication Sig Start Date End Date Taking? Authorizing Provider  acetaminophen (TYLENOL) 500 MG tablet Take 500 mg by mouth every 6 (six) hours as needed for headache.   Yes Historical Provider, MD  amLODipine (NORVASC) 10 MG tablet Take 10 mg by mouth daily.   Yes Historical Provider, MD  aspirin EC 81 MG tablet Take 81 mg by mouth daily.   Yes Historical Provider, MD  b complex vitamins tablet Take 1 tablet by mouth daily.   Yes Historical Provider, MD  cefdinir (OMNICEF) 300 MG capsule Take 1 capsule (300 mg total) by mouth 2 (two) times daily. 01/03/16  Yes [name], MD  hydrALAZINE (APRESOLINE) 100 MG tablet Take 1 tablet (100 mg total) by mouth every 8 (eight) hours. 01/30/15  Yes Reyne Dumas, MD  isosorbide dinitrate (ISORDIL) 10 MG tablet Take 1 tablet (10 mg total) by mouth 3 (three) times daily. 12/27/13  Yes Debbe Odea, MD  metFORMIN (GLUCOPHAGE) 500 MG tablet Take 500 mg by mouth  2 (two) times daily with a meal.   Yes Historical Provider, MD  naproxen (NAPROSYN) 375 MG tablet Take 1 tablet (375 mg total) by mouth 2 (two) times daily. 01/03/16  Yes [name], MD  ondansetron (ZOFRAN ODT) 4 MG disintegrating tablet 4mg  ODT q4 hours prn nausea/vomit 07/02/15  Yes [name], MD  pantoprazole (PROTONIX) 20 MG tablet Take 1 tablet (20 mg total) by mouth daily. 07/02/15  Yes [name], MD  pravastatin (PRAVACHOL) 20 MG tablet Take 20 mg by mouth at bedtime.   Yes Historical Provider, MD  tamsulosin (FLOMAX) 0.4 MG CAPS capsule Take 1 capsule (0.4 mg total) by mouth daily. 08/06/15  Yes [name], PA-C    BP 170/74 mmHg  Pulse 64  Temp(Src) 97.9 F (36.6 C) (Oral)  Resp 12  SpO2 94%   Physical Exam  Constitutional: He is oriented to person, place, and time. He appears well-developed and well-nourished.  HENT:  Head: Normocephalic and atraumatic.  Eyes: Conjunctivae are normal. Pupils are equal, round, and reactive to light. Right eye exhibits no discharge. Left eye exhibits no discharge. No scleral icterus.  Neck: Normal range of motion. No JVD present. No tracheal deviation present.  Cardiovascular: Normal rate and regular rhythm.  Exam reveals no gallop and no friction rub.   Murmur heard. Pulmonary/Chest: Effort normal and breath sounds normal. No stridor. No respiratory distress. He has no wheezes. He has no rales. He exhibits no tenderness.  Exquisitely tender to palpation of the anterior chest wall diffusely, no signs of rash, trauma  Musculoskeletal: Normal range of motion. He exhibits no edema or tenderness.  No lower extremity swelling or edema  Neurological: He is alert and oriented to person, place, and time. Coordination normal.  Skin: Skin is warm and dry. No rash noted. No erythema. No pallor.  Psychiatric: He has a normal mood and affect. His behavior is normal. Judgment and thought content normal.  Nursing note and vitals reviewed.     ED Course  Procedures (including critical care time) Labs Review Labs Reviewed - No data to display  Imaging Review Dg Chest 2 View  01/06/2016  CLINICAL DATA:  Acute onset of generalized chest pain. Not feeling well. Initial encounter. EXAM: CHEST  2 VIEW COMPARISON:  Chest radiograph from 01/02/2016 FINDINGS: The lungs are well-aerated. Previously noted right basilar pneumonia is less apparent. No pleural effusion or pneumothorax is seen. The heart is normal in size; the mediastinal contour is within normal limits. No acute osseous abnormalities are seen. IMPRESSION: Previously noted right basilar pneumonia is less apparent.  Electronically Signed   By: Garald Balding M.D.   On: 01/06/2016 06:49   I have personally reviewed and evaluated these images and lab results as part of my medical decision-making.   EKG Interpretation None      MDM   Final diagnoses:  Chest wall pain  CAP (community acquired pneumonia)   Labs:  Imaging: DG chest shows previously noted right basilar pneumonia is less apparent  Consults:  Therapeutics:  Discharge Meds:   Assessment/Plan: Patient presents for reevaluation of chest pain and draining quite pneumonia. Chest x-ray shows improvement, patient's oxygen in the upper 90s, he is not tachycardic and he has stable blood pressures. Patient is not in any acute distress, he is exquisitely tender to palpation of the chest wall. I very low suspicion for ACS or pulmonary embolism based on patient's physical, historical and diagnostic studies. Patient is instructed to continue using azithromycin, follow up with  his primary care provider for reevaluation. He is given strict return precautions in the event new or worsening signs or symptoms present. Patient verbalized understanding and agreement to today's plan and no further questions or concerns at time of discharge          Okey Regal, PA-C 01/06/16 0827  Everlene Balls, MD 01/06/16 [phone]

## 2016-03-08 ENCOUNTER — Emergency Department (HOSPITAL_COMMUNITY): Payer: Medicaid Other

## 2016-03-08 ENCOUNTER — Encounter (HOSPITAL_COMMUNITY): Payer: Self-pay | Admitting: Emergency Medicine

## 2016-03-08 DIAGNOSIS — R339 Retention of urine, unspecified: Secondary | ICD-10-CM | POA: Insufficient documentation

## 2016-03-08 DIAGNOSIS — R0789 Other chest pain: Secondary | ICD-10-CM | POA: Diagnosis not present

## 2016-03-08 DIAGNOSIS — N390 Urinary tract infection, site not specified: Secondary | ICD-10-CM | POA: Diagnosis not present

## 2016-03-08 DIAGNOSIS — F1110 Opioid abuse, uncomplicated: Secondary | ICD-10-CM | POA: Insufficient documentation

## 2016-03-08 DIAGNOSIS — Z79899 Other long term (current) drug therapy: Secondary | ICD-10-CM | POA: Insufficient documentation

## 2016-03-08 DIAGNOSIS — I509 Heart failure, unspecified: Secondary | ICD-10-CM | POA: Insufficient documentation

## 2016-03-08 DIAGNOSIS — R079 Chest pain, unspecified: Secondary | ICD-10-CM | POA: Diagnosis present

## 2016-03-08 DIAGNOSIS — Z8673 Personal history of transient ischemic attack (TIA), and cerebral infarction without residual deficits: Secondary | ICD-10-CM | POA: Insufficient documentation

## 2016-03-08 DIAGNOSIS — E119 Type 2 diabetes mellitus without complications: Secondary | ICD-10-CM | POA: Diagnosis not present

## 2016-03-08 DIAGNOSIS — F17210 Nicotine dependence, cigarettes, uncomplicated: Secondary | ICD-10-CM | POA: Insufficient documentation

## 2016-03-08 DIAGNOSIS — I10 Essential (primary) hypertension: Secondary | ICD-10-CM | POA: Insufficient documentation

## 2016-03-08 DIAGNOSIS — G8929 Other chronic pain: Secondary | ICD-10-CM | POA: Insufficient documentation

## 2016-03-08 DIAGNOSIS — Z7982 Long term (current) use of aspirin: Secondary | ICD-10-CM | POA: Diagnosis not present

## 2016-03-08 DIAGNOSIS — Z7984 Long term (current) use of oral hypoglycemic drugs: Secondary | ICD-10-CM | POA: Insufficient documentation

## 2016-03-08 LAB — BASIC METABOLIC PANEL
ANION GAP: 13 (ref 5–15)
BUN: 14 mg/dL (ref 6–20)
CALCIUM: 9.9 mg/dL (ref 8.9–10.3)
CHLORIDE: 101 mmol/L (ref 101–111)
CO2: 25 mmol/L (ref 22–32)
Creatinine, Ser: 1.1 mg/dL (ref 0.61–1.24)
GFR calc non Af Amer: >60 mL/min (ref >60)
GLUCOSE: 82 mg/dL (ref 65–99)
POTASSIUM: 4 mmol/L (ref 3.5–5.1)
Sodium: 139 mmol/L (ref 135–145)

## 2016-03-08 LAB — I-STAT TROPONIN, ED: TROPONIN I, POC: 0 ng/mL (ref 0.00–0.08)

## 2016-03-08 LAB — CBC
HEMATOCRIT: 44.7 % (ref 39.0–52.0)
HEMOGLOBIN: 14.9 g/dL (ref 13.0–17.0)
MCH: 24.4 pg — AB (ref 26.0–34.0)
MCHC: 33.3 g/dL (ref 30.0–36.0)
MCV: 73.3 fL — AB (ref 78.0–100.0)
Platelets: 267 10*3/uL (ref 150–400)
RBC: 6.1 MIL/uL — AB (ref 4.22–5.81)
RDW: 14.9 % (ref 11.5–15.5)
WBC: 13.6 10*3/uL — ABNORMAL HIGH (ref 4.0–10.5)

## 2016-03-08 NOTE — ED Notes (Signed)
Pt. reports central chest pain with SOB , dry cough and chest congestion onset this week , pt. added bloody stool this evening .

## 2016-03-09 ENCOUNTER — Encounter (HOSPITAL_COMMUNITY): Payer: Self-pay | Admitting: Radiology

## 2016-03-09 ENCOUNTER — Emergency Department (HOSPITAL_COMMUNITY): Payer: Medicaid Other

## 2016-03-09 ENCOUNTER — Emergency Department (HOSPITAL_COMMUNITY)
Admission: EM | Admit: 2016-03-09 | Discharge: 2016-03-09 | Discharge status: Against Medical Advice | Payer: Medicaid Other | Attending: Emergency Medicine | Admitting: Emergency Medicine

## 2016-03-09 DIAGNOSIS — N390 Urinary tract infection, site not specified: Secondary | ICD-10-CM

## 2016-03-09 DIAGNOSIS — R339 Retention of urine, unspecified: Secondary | ICD-10-CM

## 2016-03-09 DIAGNOSIS — R0789 Other chest pain: Secondary | ICD-10-CM

## 2016-03-09 LAB — URINALYSIS, ROUTINE W REFLEX MICROSCOPIC
Bilirubin Urine: NEGATIVE
Glucose, UA: NEGATIVE mg/dL
Hgb urine dipstick: NEGATIVE
Ketones, ur: NEGATIVE mg/dL
Nitrite: NEGATIVE
PROTEIN: NEGATIVE mg/dL
Specific Gravity, Urine: 1.029 (ref 1.005–1.030)
pH: 5.5 (ref 5.0–8.0)

## 2016-03-09 LAB — TROPONIN I: Troponin I: <0.03 ng/mL (ref <0.031)

## 2016-03-09 LAB — HEPATIC FUNCTION PANEL
ALT: 33 U/L (ref 17–63)
AST: 34 U/L (ref 15–41)
Albumin: 3.9 g/dL (ref 3.5–5.0)
Alkaline Phosphatase: 66 U/L (ref 38–126)
BILIRUBIN DIRECT: 0.1 mg/dL (ref 0.1–0.5)
Indirect Bilirubin: 0.1 mg/dL — ABNORMAL LOW (ref 0.3–0.9)
TOTAL PROTEIN: 6.8 g/dL (ref 6.5–8.1)
Total Bilirubin: 0.2 mg/dL — ABNORMAL LOW (ref 0.3–1.2)

## 2016-03-09 LAB — RAPID URINE DRUG SCREEN, HOSP PERFORMED
AMPHETAMINES: NOT DETECTED
BENZODIAZEPINES: NOT DETECTED
Barbiturates: NOT DETECTED
COCAINE: NOT DETECTED
OPIATES: POSITIVE — AB
Tetrahydrocannabinol: NOT DETECTED

## 2016-03-09 LAB — LIPASE, BLOOD: Lipase: 32 U/L (ref 11–51)

## 2016-03-09 LAB — URINE MICROSCOPIC-ADD ON

## 2016-03-09 LAB — POC OCCULT BLOOD, ED: Fecal Occult Bld: POSITIVE — AB

## 2016-03-09 LAB — CK: CK TOTAL: 216 U/L (ref 49–397)

## 2016-03-09 MED ORDER — IOHEXOL 350 MG/ML SOLN
80.0000 mL | Freq: Once | INTRAVENOUS | Status: AC | PRN
  Administered 2016-03-09: 100 mL via INTRAVENOUS

## 2016-03-09 MED ORDER — CEPHALEXIN 500 MG PO CAPS: 500.0000 mg | ORAL_CAPSULE | Freq: Four times a day (QID) | ORAL | Status: DC

## 2016-03-09 MED ORDER — DEXTROSE 5 % IV SOLN
1.0000 g | Freq: Once | INTRAVENOUS | Status: AC
  Administered 2016-03-09: 1 g via INTRAVENOUS
  Filled 2016-03-09: qty 10

## 2016-03-09 NOTE — ED Provider Notes (Signed)
CSN: [id number]     Arrival date & time 03/08/16  2008 History  By signing my name below, I, [name], attest that this documentation has been prepared under the direction and in the presence of Samuel Essex, MD. Electronically Signed: Julien [name], ED Scribe. 03/09/2016. 1:09 AM.    Chief Complaint  Patient presents with  . Chest Pain  . Hematochezia      The history is provided by the patient. No language interpreter was used.   HPI Comments: [name] is a 65 y.o. male who has a hx of HTN, DM, CVA, TIA, HTN, CHF presents to the Emergency Department complaining of constant, gradual worsening central chest pain and lower abdominal pain onset yesterday with associated generalized body aches, dry cough, headache, dizziness, and shortness of breath. He endorses increased chest pain with touch. Pain is constant and does not radiate. It is not pleuritic or exertional.  He says he noticed some blood when he wiped after taking a stool but there was no actual blood in his stool. Pt denies fever, vomiting, diarrhea, hx of MI, hx of stents, and bowel or bladder incontinence.   Past Medical History  Diagnosis Date  . Hypertension   . Diabetes mellitus   . Brain tumor ([place])   . CVA (cerebral vascular accident) ([place])   . Narcotic abuse   . TIA (transient ischemic attack) 09/06/2012  . Vertigo 09/06/2012  . HTN (hypertension), malignant 09/06/2012  . Tobacco use disorder 12/10/2013  . Alcohol dependence ([place]) 12/10/2013  . Angioedema of lips 12/24/2013  . Chronic headache   . Chronic back pain   . CHF (congestive heart failure) ([facility])     diastolic  . Normal cardiac stress test 2009   Past Surgical History  Procedure Laterality Date  . No past surgeries     Family History  Problem Relation Age of Onset  . CAD Father     "heart attack in his 43's"  . Hypertension Father   . Diabetes Brother   . Diabetes Sister   . Hypertension Mother    Social History  Substance Use  Topics  . Smoking status: Current Every Day Smoker -- 0.50 packs/day    Types: Cigarettes  . Smokeless tobacco: Current User  . Alcohol Use: 0.0 oz/week    0 Standard drinks or equivalent per week     Comment: 6 pack a week    Review of Systems  A complete 10 system review of systems was obtained and all systems are negative except as noted in the HPI and PMH.    Allergies  Review of patient's allergies indicates no known allergies.  Home Medications   Prior to Admission medications   Medication Sig Start Date End Date Taking? Authorizing Provider  acetaminophen (TYLENOL) 500 MG tablet Take 500 mg by mouth every 6 (six) hours as needed for headache.   Yes Historical Provider, MD  amLODipine (NORVASC) 10 MG tablet Take 10 mg by mouth daily.   Yes Historical Provider, MD  aspirin EC 81 MG tablet Take 81 mg by mouth daily.   Yes Historical Provider, MD  b complex vitamins tablet Take 1 tablet by mouth daily.   Yes Historical Provider, MD  hydrALAZINE (APRESOLINE) 100 MG tablet Take 1 tablet (100 mg total) by mouth every 8 (eight) hours. 01/30/15  Yes Reyne Dumas, MD  isosorbide dinitrate (ISORDIL) 10 MG tablet Take 1 tablet (10 mg total) by mouth 3 (three) times daily. 12/27/13  Yes Saima  Rizwan, MD  metFORMIN (GLUCOPHAGE) 500 MG tablet Take 500 mg by mouth 2 (two) times daily with a meal.   Yes Historical Provider, MD  pantoprazole (PROTONIX) 20 MG tablet Take 1 tablet (20 mg total) by mouth daily. 07/02/15  Yes [name], MD  pravastatin (PRAVACHOL) 20 MG tablet Take 20 mg by mouth at bedtime.   Yes Historical Provider, MD  tamsulosin (FLOMAX) 0.4 MG CAPS capsule Take 1 capsule (0.4 mg total) by mouth daily. 08/06/15  Yes [name], PA-C  cefdinir (OMNICEF) 300 MG capsule Take 1 capsule (300 mg total) by mouth 2 (two) times daily. Patient not taking: Reported on 03/09/2016 01/03/16   [name], MD  cephALEXin (KEFLEX) 500 MG capsule Take 1 capsule (500 mg total) by mouth 4  (four) times daily. 03/09/16   Samuel Essex, MD  naproxen (NAPROSYN) 375 MG tablet Take 1 tablet (375 mg total) by mouth 2 (two) times daily. Patient not taking: Reported on 03/09/2016 01/03/16   [name], MD  ondansetron (ZOFRAN ODT) 4 MG disintegrating tablet 4mg  ODT q4 hours prn nausea/vomit Patient not taking: Reported on 03/09/2016 07/02/15   [name], MD   Triage vitals: BP 160/63 mmHg  Pulse 94  Temp(Src) 98.1 F (36.7 C) (Oral)  Resp 18  SpO2 99% Physical Exam  Constitutional: He is oriented to person, place, and time. He appears well-developed and well-nourished. No distress.  Appears uncomfortable  HENT:  Head: Normocephalic and atraumatic.  Mouth/Throat: Oropharynx is clear and moist. No oropharyngeal exudate.  Eyes: Conjunctivae and EOM are normal. Pupils are equal, round, and reactive to light.  Neck: Normal range of motion. Neck supple.  No meningismus.  Cardiovascular: Normal rate, regular rhythm, normal heart sounds and intact distal pulses.   No murmur heard. Pulmonary/Chest: Effort normal and breath sounds normal. No respiratory distress. He exhibits tenderness.  Reproducible left chest wall tenderness  Abdominal: Soft. There is tenderness. There is guarding. There is no rebound.  Diffused abdominal tenderness, worse periumbilical with voluntary guarding  Musculoskeletal: Normal range of motion. He exhibits no edema or tenderness.  slight left sided weakness but at baseline  Neurological: He is alert and oriented to person, place, and time. No cranial nerve deficit. He exhibits normal muscle tone. Coordination normal.  No ataxia on finger to nose bilaterally. No pronator drift. 5/5 strength throughout on R, 4/5 strength on L (baseline). CN 2-12 intact.Equal grip strength. Sensation intact.   Skin: Skin is warm.  Psychiatric: He has a normal mood and affect. His behavior is normal.  Nursing note and vitals reviewed.   ED Course  Procedures  DIAGNOSTIC  STUDIES: Oxygen Saturation is 99% on RA, normal by my interpretation.  COORDINATION OF CARE:  1:03 AM Discussed treatment plan which includes CT of head and abdomen, rectal exam, urinalysis, lab work with pt at bedside and pt agreed to plan.  Labs Review Labs Reviewed  CBC - Abnormal; Notable for the following:    WBC 13.6 (*)    RBC 6.10 (*)    MCV 73.3 (*)    MCH 24.4 (*)    All other components within normal limits  HEPATIC FUNCTION PANEL - Abnormal; Notable for the following:    Total Bilirubin 0.2 (*)    Indirect Bilirubin 0.1 (*)    All other components within normal limits  URINALYSIS, ROUTINE W REFLEX MICROSCOPIC (NOT AT [facility]) - Abnormal; Notable for the following:    APPearance CLOUDY (*)    Leukocytes, UA LARGE (*)  All other components within normal limits  URINE RAPID DRUG SCREEN, HOSP PERFORMED - Abnormal; Notable for the following:    Opiates POSITIVE (*)    All other components within normal limits  URINE MICROSCOPIC-ADD ON - Abnormal; Notable for the following:    Squamous Epithelial / LPF 6-30 (*)    Bacteria, UA MANY (*)    All other components within normal limits  POC OCCULT BLOOD, ED - Abnormal; Notable for the following:    Fecal Occult Bld POSITIVE (*)    All other components within normal limits  URINE CULTURE  BASIC METABOLIC PANEL  LIPASE, BLOOD  TROPONIN I  CK  I-STAT TROPOININ, ED    Imaging Review Dg Chest 2 View  03/08/2016  CLINICAL DATA:  Cough, congestion, chest pain, body aches. EXAM: CHEST  2 VIEW COMPARISON:  01/06/2016 FINDINGS: Heart and mediastinal contours are within normal limits. No focal opacities or effusions. No acute bony abnormality. IMPRESSION: No active cardiopulmonary disease. Electronically Signed   By: Rolm Baptise M.D.   On: 03/08/2016 20:47   Ct Head Wo Contrast  03/09/2016  CLINICAL DATA:  Headache EXAM: CT HEAD WITHOUT CONTRAST TECHNIQUE: Contiguous axial images were obtained from the base of the skull through the  vertex without intravenous contrast. COMPARISON:  11/03/2014 FINDINGS: Skull and Sinuses:Negative for fracture or destructive process. The visualized mastoids, middle ears, and imaged paranasal sinuses are clear. Visualized orbits: Negative. Brain: No evidence of acute infarction, hemorrhage, hydrocephalus, or swelling. History of pituitary adenoma with no appreciable change in sellar density or pituitary size. IMPRESSION: Stable exam.  No acute finding to explain headache. Electronically Signed   By: Monte Fantasia M.D.   On: 03/09/2016 03:35   Ct Abdomen Pelvis W Contrast  03/09/2016  CLINICAL DATA:  Chest pain and back pain.  Lower abdominal pain. EXAM: CT ANGIOGRAPHY CHEST CT ABDOMEN AND PELVIS WITH CONTRAST TECHNIQUE: Multidetector CT imaging of the chest was performed using the standard protocol during bolus administration of intravenous contrast. Multiplanar CT image reconstructions and MIPs were obtained to evaluate the vascular anatomy. Multidetector CT imaging of the abdomen and pelvis was performed using the standard protocol during bolus administration of intravenous contrast. CONTRAST:  137mL OMNIPAQUE IOHEXOL 350 MG/ML SOLN COMPARISON:  01/26/2015 chest CT.  08/06/2015 abdominal CT. FINDINGS: CTA CHEST FINDINGS THORACIC INLET/BODY WALL: No acute abnormality. MEDIASTINUM: Normal heart size. No pericardial effusion. Atherosclerosis including the aorta and proximal left main coronary artery. There is no evidence of aortic intramural hematoma or dissection. No penetrating ulcer or aneurysm. The opacified pulmonary arteries show no filling defect. No adenopathy. LUNG WINDOWS: No consolidation.  No effusion.  No suspicious pulmonary nodule. OSSEOUS: No acute fracture.  No suspicious lytic or blastic lesions. CT ABDOMEN and PELVIS FINDINGS Lower chest and abdominal wall: Small intramuscular right abdominal wall lipoma, 6 cm in diameter by 1 cm in thickness. Hepatobiliary: No focal liver abnormality.No  evidence of biliary obstruction or stone. Pancreas: Unremarkable. Spleen: Unremarkable. Adrenals/Urinary Tract: Negative adrenals. No hydronephrosis or asymmetric renal enhancement. Contrast excretion limits detection of stone. Thick walled bladder with multiple diverticula, the largest measuring nearly 8 cm from the right dome. No internal stone or other filling defect. Currently the bladder and diverticula are distended similar to prior. Reproductive:No pathologic findings. Stomach/Bowel: No evidence of obstruction or inflammation. Extensive diverticulosis of the colon. Vascular/Lymphatic: Extensive atherosclerosis. No acute vascular finding. Prominent periportal lymph nodes are stable and likely incidental in isolation. Peritoneal: No ascites or pneumoperitoneum. Musculoskeletal: No acute  abnormalities. Review of the MIP images confirms the above findings. IMPRESSION: 1. No acute intrathoracic or intra-abdominal finding. No evidence of acute aortic syndrome. 2. Chronic bladder outlet obstruction with diverticula. 3. Colonic diverticulosis. Electronically Signed   By: Monte Fantasia M.D.   On: 03/09/2016 03:48   Ct Angio Chest Aorta W/cm &/or Wo/cm  03/09/2016  CLINICAL DATA:  Chest pain and back pain.  Lower abdominal pain. EXAM: CT ANGIOGRAPHY CHEST CT ABDOMEN AND PELVIS WITH CONTRAST TECHNIQUE: Multidetector CT imaging of the chest was performed using the standard protocol during bolus administration of intravenous contrast. Multiplanar CT image reconstructions and MIPs were obtained to evaluate the vascular anatomy. Multidetector CT imaging of the abdomen and pelvis was performed using the standard protocol during bolus administration of intravenous contrast. CONTRAST:  15mL OMNIPAQUE IOHEXOL 350 MG/ML SOLN COMPARISON:  01/26/2015 chest CT.  08/06/2015 abdominal CT. FINDINGS: CTA CHEST FINDINGS THORACIC INLET/BODY WALL: No acute abnormality. MEDIASTINUM: Normal heart size. No pericardial effusion.  Atherosclerosis including the aorta and proximal left main coronary artery. There is no evidence of aortic intramural hematoma or dissection. No penetrating ulcer or aneurysm. The opacified pulmonary arteries show no filling defect. No adenopathy. LUNG WINDOWS: No consolidation.  No effusion.  No suspicious pulmonary nodule. OSSEOUS: No acute fracture.  No suspicious lytic or blastic lesions. CT ABDOMEN and PELVIS FINDINGS Lower chest and abdominal wall: Small intramuscular right abdominal wall lipoma, 6 cm in diameter by 1 cm in thickness. Hepatobiliary: No focal liver abnormality.No evidence of biliary obstruction or stone. Pancreas: Unremarkable. Spleen: Unremarkable. Adrenals/Urinary Tract: Negative adrenals. No hydronephrosis or asymmetric renal enhancement. Contrast excretion limits detection of stone. Thick walled bladder with multiple diverticula, the largest measuring nearly 8 cm from the right dome. No internal stone or other filling defect. Currently the bladder and diverticula are distended similar to prior. Reproductive:No pathologic findings. Stomach/Bowel: No evidence of obstruction or inflammation. Extensive diverticulosis of the colon. Vascular/Lymphatic: Extensive atherosclerosis. No acute vascular finding. Prominent periportal lymph nodes are stable and likely incidental in isolation. Peritoneal: No ascites or pneumoperitoneum. Musculoskeletal: No acute abnormalities. Review of the MIP images confirms the above findings. IMPRESSION: 1. No acute intrathoracic or intra-abdominal finding. No evidence of acute aortic syndrome. 2. Chronic bladder outlet obstruction with diverticula. 3. Colonic diverticulosis. Electronically Signed   By: Monte Fantasia M.D.   On: 03/09/2016 03:48   I have personally reviewed and evaluated these images and lab results as part of my medical decision-making.   EKG Interpretation   Date/Time:  Saturday March 08 2016 20:22:57 EST Ventricular Rate:  92 PR Interval:   154 QRS Duration: 84 QT Interval:  372 QTC Calculation: 460 R Axis:   14 Text Interpretation:  Normal sinus rhythm Cannot rule out Anteroseptal  infarct , age undetermined Abnormal ECG No significant change was found  Confirmed by Wyvonnia Dusky  MD, Brigida Scotti [phone]) on 03/09/2016 12:54:37 AM      MDM   Final diagnoses:  Atypical chest pain  Urinary tract infection without hematuria, site unspecified  Urinary retention   patient with multiple complaints. Complains of central chest pain as well as diffuse body aches that onset yesterday. Endorses dry cough and congestion. Denies fever. States he hurts everywhere.  EKG unchanged. Chest pain is reproducible and atypical for ACS. Endorses diffuse bodyaches as well as cough. Consider infectious viral syndrome. Afebrile. CXR negative.  Labs are reassuring.  Troponin negative x2. Hemoglobin stable. WBC 13. Imaging is negative for acute intracranial abdominal pathology. Patient has known  chronic bladder outlet obstruction with diverticula and chronic diverticulosis.  UA positive for infection, culture sent. Rocephin.   UA and CT results d/w Dr. Eulogio Ditch of urology.  Bladder scan 593 Ml.  Attempted urination, only able to urinate 100 cc.   Advised patient that Foley catheter is indicated for bladder drainage.  He refuses citing discomfort and " I can't be dragging that around".  Informed that urinary retention could get worse and he could develop kidney failure or worsening infection. He understands these risks and continues to refuse Foley placement.  D/w Dr. Eulogio Ditch who has reviewed CT imaging.  He feels findings are chronic.  Agrees with UTI treatment.  He is aware of patient's foley refusal. Recommends follow up in urology office. Patient leaving against medical advice due to refusing Foley catheter. He appears to have capacity to make medical decisions.  I personally performed the services described in this documentation, which was scribed  in my presence. The recorded information has been reviewed and is accurate.   Samuel Essex, MD 03/09/16 [phone]

## 2016-03-09 NOTE — ED Notes (Signed)
Also reports blood in stool x 1 occurrence today

## 2016-03-09 NOTE — ED Notes (Signed)
Eating crackers and drinking ginger ale

## 2016-03-09 NOTE — ED Notes (Signed)
Assisted up to bathroom stated he needed to have a bowel movement

## 2016-03-09 NOTE — ED Notes (Signed)
[place], MD notified about foley refusal. At bedside talking with pt.

## 2016-03-09 NOTE — ED Notes (Signed)
Pt refused foley catheter

## 2016-03-09 NOTE — ED Notes (Signed)
Voided in urinal 100 cc

## 2016-03-09 NOTE — Discharge Instructions (Signed)
Acute Urinary Retention, Male Take the antibiotics as prescribed. Follow up with the urologist. You declined a catheter today, you may continue to have bladder and kidney problems. Return to the ED if you wish to be reevaluated. Acute urinary retention is the temporary inability to urinate. This is a common problem in older men. As men age their prostates become larger and block the flow of urine from the bladder. This is usually a problem that has come on gradually.  HOME CARE INSTRUCTIONS If you are sent home with a Foley catheter and a drainage system, you will need to discuss the best course of action with your health care provider. While the catheter is in, maintain a good intake of fluids. Keep the drainage bag emptied and lower than your catheter. This is so that contaminated urine will not flow back into your bladder, which could lead to a urinary tract infection. There are two main types of drainage bags. One is a large bag that usually is used at night. It has a good capacity that will allow you to sleep through the night without having to empty it. The second type is called a leg bag. It has a smaller capacity, so it needs to be emptied more frequently. However, the main advantage is that it can be attached by a leg strap and can go underneath your clothing, allowing you the freedom to move about or leave your home. Only take over-the-counter or prescription medicines for pain, discomfort, or fever as directed by your health care provider.  SEEK MEDICAL CARE IF:  You develop a low-grade fever.  You experience spasms or leakage of urine with the spasms. SEEK IMMEDIATE MEDICAL CARE IF:   You develop chills or fever.  Your catheter stops draining urine.  Your catheter falls out.  You start to develop increased bleeding that does not respond to rest and increased fluid intake. MAKE SURE YOU:  Understand these instructions.  Will watch your condition.  Will get help right away if  you are not doing well or get worse.   This information is not intended to replace advice given to you by your health care provider. Make sure you discuss any questions you have with your health care provider.   Document Released: 03/23/2001 Document Revised: 05/01/2015 Document Reviewed: 05/26/2013 Elsevier Interactive Patient Education [employer].

## 2016-03-10 LAB — URINE CULTURE: CULTURE: NO GROWTH

## 2016-03-20 ENCOUNTER — Encounter (HOSPITAL_COMMUNITY): Payer: Self-pay

## 2016-03-20 ENCOUNTER — Emergency Department (HOSPITAL_COMMUNITY): Payer: Medicaid Other

## 2016-03-20 ENCOUNTER — Emergency Department (HOSPITAL_COMMUNITY)
Admission: EM | Admit: 2016-03-20 | Discharge: 2016-03-20 | Disposition: A | Discharge status: Home / Self Care | Payer: Medicaid Other | Attending: Emergency Medicine | Admitting: Emergency Medicine

## 2016-03-20 DIAGNOSIS — R1010 Upper abdominal pain, unspecified: Secondary | ICD-10-CM | POA: Diagnosis not present

## 2016-03-20 DIAGNOSIS — F17210 Nicotine dependence, cigarettes, uncomplicated: Secondary | ICD-10-CM | POA: Diagnosis not present

## 2016-03-20 DIAGNOSIS — Z792 Long term (current) use of antibiotics: Secondary | ICD-10-CM | POA: Insufficient documentation

## 2016-03-20 DIAGNOSIS — R1013 Epigastric pain: Secondary | ICD-10-CM | POA: Diagnosis not present

## 2016-03-20 DIAGNOSIS — Z8673 Personal history of transient ischemic attack (TIA), and cerebral infarction without residual deficits: Secondary | ICD-10-CM | POA: Insufficient documentation

## 2016-03-20 DIAGNOSIS — I5030 Unspecified diastolic (congestive) heart failure: Secondary | ICD-10-CM | POA: Diagnosis not present

## 2016-03-20 DIAGNOSIS — I10 Essential (primary) hypertension: Secondary | ICD-10-CM | POA: Insufficient documentation

## 2016-03-20 DIAGNOSIS — R197 Diarrhea, unspecified: Secondary | ICD-10-CM | POA: Diagnosis not present

## 2016-03-20 DIAGNOSIS — Z7984 Long term (current) use of oral hypoglycemic drugs: Secondary | ICD-10-CM | POA: Insufficient documentation

## 2016-03-20 DIAGNOSIS — E119 Type 2 diabetes mellitus without complications: Secondary | ICD-10-CM | POA: Diagnosis not present

## 2016-03-20 DIAGNOSIS — Z79899 Other long term (current) drug therapy: Secondary | ICD-10-CM | POA: Diagnosis not present

## 2016-03-20 DIAGNOSIS — Z7982 Long term (current) use of aspirin: Secondary | ICD-10-CM | POA: Insufficient documentation

## 2016-03-20 DIAGNOSIS — Z85841 Personal history of malignant neoplasm of brain: Secondary | ICD-10-CM | POA: Diagnosis not present

## 2016-03-20 DIAGNOSIS — R112 Nausea with vomiting, unspecified: Secondary | ICD-10-CM | POA: Insufficient documentation

## 2016-03-20 DIAGNOSIS — G8929 Other chronic pain: Secondary | ICD-10-CM | POA: Insufficient documentation

## 2016-03-20 LAB — URINALYSIS, ROUTINE W REFLEX MICROSCOPIC
Bilirubin Urine: NEGATIVE
GLUCOSE, UA: NEGATIVE mg/dL
HGB URINE DIPSTICK: NEGATIVE
KETONES UR: NEGATIVE mg/dL
Nitrite: NEGATIVE
PH: 8 (ref 5.0–8.0)
PROTEIN: NEGATIVE mg/dL
Specific Gravity, Urine: 1.019 (ref 1.005–1.030)

## 2016-03-20 LAB — URINE MICROSCOPIC-ADD ON

## 2016-03-20 LAB — COMPREHENSIVE METABOLIC PANEL
ALBUMIN: 4.4 g/dL (ref 3.5–5.0)
ALT: 44 U/L (ref 17–63)
AST: 40 U/L (ref 15–41)
Alkaline Phosphatase: 67 U/L (ref 38–126)
Anion gap: 14 (ref 5–15)
BUN: 19 mg/dL (ref 6–20)
CHLORIDE: 102 mmol/L (ref 101–111)
CO2: 19 mmol/L — AB (ref 22–32)
CREATININE: 1.16 mg/dL (ref 0.61–1.24)
Calcium: 10.5 mg/dL — ABNORMAL HIGH (ref 8.9–10.3)
GFR calc Af Amer: >60 mL/min (ref >60)
GLUCOSE: 193 mg/dL — AB (ref 65–99)
Potassium: 4.5 mmol/L (ref 3.5–5.1)
SODIUM: 135 mmol/L (ref 135–145)
Total Bilirubin: 0.7 mg/dL (ref 0.3–1.2)
Total Protein: 8.6 g/dL — ABNORMAL HIGH (ref 6.5–8.1)

## 2016-03-20 LAB — LIPASE, BLOOD: LIPASE: 46 U/L (ref 11–51)

## 2016-03-20 LAB — CBC
HCT: 48.6 % (ref 39.0–52.0)
Hemoglobin: 15.8 g/dL (ref 13.0–17.0)
MCH: 23.9 pg — AB (ref 26.0–34.0)
MCHC: 32.5 g/dL (ref 30.0–36.0)
MCV: 73.4 fL — AB (ref 78.0–100.0)
PLATELETS: 278 10*3/uL (ref 150–400)
RBC: 6.62 MIL/uL — ABNORMAL HIGH (ref 4.22–5.81)
RDW: 14.7 % (ref 11.5–15.5)
WBC: 11.5 10*3/uL — ABNORMAL HIGH (ref 4.0–10.5)

## 2016-03-20 MED ORDER — MORPHINE SULFATE (PF) 4 MG/ML IV SOLN
4.0000 mg | Freq: Once | INTRAVENOUS | Status: AC
  Administered 2016-03-20: 4 mg via INTRAVENOUS
  Filled 2016-03-20: qty 1

## 2016-03-20 MED ORDER — SODIUM CHLORIDE 0.9 % IV BOLUS (SEPSIS)
500.0000 mL | Freq: Once | INTRAVENOUS | Status: AC
  Administered 2016-03-20: 500 mL via INTRAVENOUS

## 2016-03-20 MED ORDER — SODIUM CHLORIDE 0.9 % IV BOLUS (SEPSIS)
1000.0000 mL | Freq: Once | INTRAVENOUS | Status: AC
  Administered 2016-03-20: 1000 mL via INTRAVENOUS

## 2016-03-20 MED ORDER — PROMETHAZINE HCL 25 MG/ML IJ SOLN
25.0000 mg | Freq: Once | INTRAMUSCULAR | Status: AC
  Administered 2016-03-20: 25 mg via INTRAVENOUS
  Filled 2016-03-20: qty 1

## 2016-03-20 MED ORDER — IOHEXOL 300 MG/ML  SOLN
75.0000 mL | Freq: Once | INTRAMUSCULAR | Status: AC | PRN
  Administered 2016-03-20: 75 mL via INTRAVENOUS

## 2016-03-20 MED ORDER — HYDROCODONE-ACETAMINOPHEN 5-325 MG PO TABS
1.0000 | ORAL_TABLET | Freq: Four times a day (QID) | ORAL | Status: DC | PRN
Start: 2016-03-20 — End: 2016-05-21

## 2016-03-20 MED ORDER — ONDANSETRON HCL 4 MG/2ML IJ SOLN
4.0000 mg | Freq: Once | INTRAMUSCULAR | Status: AC | PRN
  Administered 2016-03-20: 4 mg via INTRAVENOUS
  Filled 2016-03-20: qty 2

## 2016-03-20 MED ORDER — ONDANSETRON HCL 4 MG PO TABS: 4.0000 mg | ORAL_TABLET | Freq: Four times a day (QID) | ORAL | Status: DC

## 2016-03-20 NOTE — ED Notes (Signed)
Pt from home by [facility]. Pt has had diarrhea for a day multi episodes. Pt complaining of Nausea and Vomiting and diarrhea.

## 2016-03-20 NOTE — Discharge Instructions (Signed)

## 2016-03-20 NOTE — ED Provider Notes (Signed)
CSN: [id number]     Arrival date & time 03/20/16  0545 History   First MD Initiated Contact with Patient 03/20/16 0600     Chief Complaint  Patient presents with  . Vomiting  . Diarrhea     (Consider location/radiation/quality/duration/timing/severity/associated sxs/prior Treatment) HPI Comments: Patient presents to the ED with a chief complaint of epigastric abdominal pain.  He reports associated nausea, vomiting, and diarrhea.  He states that the symptoms started yesterday.  He denies any history of pancreatitis.  Denies any alcohol use.  He has not tried taking anything for his symptoms.  Denies any associated CP, SOB, dysuria, or other symptoms.  There are no modifying factors.  The history is provided by the patient. No language interpreter was used.    Past Medical History  Diagnosis Date  . Hypertension   . Diabetes mellitus   . Brain tumor ([place])   . CVA (cerebral vascular accident) ([place])   . Narcotic abuse   . TIA (transient ischemic attack) 09/06/2012  . Vertigo 09/06/2012  . HTN (hypertension), malignant 09/06/2012  . Tobacco use disorder 12/10/2013  . Alcohol dependence ([place]) 12/10/2013  . Angioedema of lips 12/24/2013  . Chronic headache   . Chronic back pain   . CHF (congestive heart failure) (HCC)     diastolic  . Normal cardiac stress test 2009   Past Surgical History  Procedure Laterality Date  . No past surgeries     Family History  Problem Relation Age of Onset  . CAD Father     "heart attack in his 65's"  . Hypertension Father   . Diabetes Brother   . Diabetes Sister   . Hypertension Mother    Social History  Substance Use Topics  . Smoking status: Current Every Day Smoker -- 0.50 packs/day    Types: Cigarettes  . Smokeless tobacco: Current User  . Alcohol Use: 0.0 oz/week    0 Standard drinks or equivalent per week     Comment: 6 pack a week    Review of Systems  Constitutional: Negative for fever and chills.  Respiratory: Negative for  shortness of breath.   Cardiovascular: Negative for chest pain.  Gastrointestinal: Positive for nausea, vomiting and diarrhea. Negative for constipation and rectal pain.  Genitourinary: Negative for dysuria.  All other systems reviewed and are negative.     Allergies  Review of patient's allergies indicates no known allergies.  Home Medications   Prior to Admission medications   Medication Sig Start Date End Date Taking? Authorizing Provider  acetaminophen (TYLENOL) 500 MG tablet Take 500 mg by mouth every 6 (six) hours as needed for headache.    Historical Provider, MD  amLODipine (NORVASC) 10 MG tablet Take 10 mg by mouth daily.    Historical Provider, MD  aspirin EC 81 MG tablet Take 81 mg by mouth daily.    Historical Provider, MD  b complex vitamins tablet Take 1 tablet by mouth daily.    Historical Provider, MD  cefdinir (OMNICEF) 300 MG capsule Take 1 capsule (300 mg total) by mouth 2 (two) times daily. Patient not taking: Reported on 03/09/2016 01/03/16   [name], MD  cephALEXin (KEFLEX) 500 MG capsule Take 1 capsule (500 mg total) by mouth 4 (four) times daily. 03/09/16   [name], MD  hydrALAZINE (APRESOLINE) 100 MG tablet Take 1 tablet (100 mg total) by mouth every 8 (eight) hours. 01/30/15   Reyne Dumas, MD  isosorbide dinitrate (ISORDIL) 10 MG tablet Take 1  tablet (10 mg total) by mouth 3 (three) times daily. 12/27/13   Debbe Odea, MD  metFORMIN (GLUCOPHAGE) 500 MG tablet Take 500 mg by mouth 2 (two) times daily with a meal.    Historical Provider, MD  naproxen (NAPROSYN) 375 MG tablet Take 1 tablet (375 mg total) by mouth 2 (two) times daily. Patient not taking: Reported on 03/09/2016 01/03/16   [name], MD  ondansetron (ZOFRAN ODT) 4 MG disintegrating tablet 4mg  ODT q4 hours prn nausea/vomit Patient not taking: Reported on 03/09/2016 07/02/15   [name], MD  pantoprazole (PROTONIX) 20 MG tablet Take 1 tablet (20 mg total) by mouth daily. 07/02/15    [name], MD  pravastatin (PRAVACHOL) 20 MG tablet Take 20 mg by mouth at bedtime.    Historical Provider, MD  tamsulosin (FLOMAX) 0.4 MG CAPS capsule Take 1 capsule (0.4 mg total) by mouth daily. 08/06/15   [name], PA-C   BP 177/75 mmHg  Pulse 82  Temp(Src) 99.7 F (37.6 C)  SpO2 96% Physical Exam  Constitutional: He is oriented to person, place, and time. He appears well-developed and well-nourished.  HENT:  Head: Normocephalic and atraumatic.  Eyes: Conjunctivae and EOM are normal. Pupils are equal, round, and reactive to light. Right eye exhibits no discharge. Left eye exhibits no discharge. No scleral icterus.  Neck: Normal range of motion. Neck supple. No JVD present.  Cardiovascular: Normal rate, regular rhythm and normal heart sounds.  Exam reveals no gallop and no friction rub.   No murmur heard. Pulmonary/Chest: Effort normal and breath sounds normal. No respiratory distress. He has no wheezes. He has no rales. He exhibits no tenderness.  Abdominal: Soft. He exhibits no distension and no mass. There is tenderness. There is no rebound and no guarding.  Moderate upper abdominal tenderness with guarding, no lower abdominal tenderness  Musculoskeletal: Normal range of motion. He exhibits no edema or tenderness.  Neurological: He is alert and oriented to person, place, and time.  Skin: Skin is warm and dry.  Psychiatric: He has a normal mood and affect. His behavior is normal. Judgment and thought content normal.  Nursing note and vitals reviewed.   ED Course  Procedures (including critical care time) Labs Review Labs Reviewed  COMPREHENSIVE METABOLIC PANEL - Abnormal; Notable for the following:    CO2 19 (*)    Glucose, Bld 193 (*)    Calcium 10.5 (*)    Total Protein 8.6 (*)    All other components within normal limits  CBC - Abnormal; Notable for the following:    WBC 11.5 (*)    RBC 6.62 (*)    MCV 73.4 (*)    MCH 23.9 (*)    All other components  within normal limits  URINALYSIS, ROUTINE W REFLEX MICROSCOPIC (NOT AT [facility]) - Abnormal; Notable for the following:    APPearance CLOUDY (*)    Leukocytes, UA SMALL (*)    All other components within normal limits  URINE MICROSCOPIC-ADD ON - Abnormal; Notable for the following:    Squamous Epithelial / LPF 6-30 (*)    Bacteria, UA MANY (*)    Casts HYALINE CASTS (*)    All other components within normal limits  URINE CULTURE  LIPASE, BLOOD    Imaging Review Ct Abdomen Pelvis W Contrast  03/20/2016  CLINICAL DATA:  Mid abdominal pain, nausea, vomiting, diarrhea starting yesterday EXAM: CT ABDOMEN AND PELVIS WITH CONTRAST TECHNIQUE: Multidetector CT imaging of the abdomen and pelvis was performed using  the standard protocol following bolus administration of intravenous contrast. CONTRAST:  42mL OMNIPAQUE IOHEXOL 300 MG/ML  SOLN COMPARISON:  CT scan 03/09/2016 and 08/06/2015 FINDINGS: Lower chest: Lung bases shows mild dependent atelectasis posteriorly. Small hiatal hernia is noted. Hepatobiliary: No intrahepatic biliary ductal dilatation. Again noted 2 cm cyst in lateral segment of left hepatic lobe. A second tiny cyst in left hepatic lobe measures 7 mm. Gallbladder is is contracted. No calcified gallstones are noted within gallbladder. Pancreas: No mass, inflammatory changes, or other significant abnormality. Spleen: Within normal limits in size and appearance. Adrenals/Urinary Tract: No adrenal gland mass is noted. Kidneys are symmetrical in size and enhancement. No hydronephrosis or hydroureter. Delayed renal images shows bilateral renal symmetrical excretion. Bilateral visualized proximal ureter is unremarkable. Again noted significant thickening of urinary bladder wall with slight progression from prior exam. Bladder wall measures at least 8 mm thickness. Findings are highly suspicious for chronic cystitis or inflammation. Less likely neoplastic process. Correlation with urology exam is  recommended. Again noted a large right urinary bladder diverticulum and right pelvis measures at least 6.7 cm. Again noted a left urinary bladder diverticulum in left lateral pelvis measures 3 cm. No calcified calculi are noted within urinary bladder or within bladder diverticuli. Stomach/Bowel: The study is limited without IV or oral contrast. There is no evidence of gastric outlet obstruction. Diffuse mild distended small bowel loops within mid abdomen and lower abdomen without transition point in caliber. No thickening of small bowel wall. Nonspecific enteritis cannot be excluded. Clinical correlation is necessary. There is no pericecal inflammation. Some liquid stool noted within cecum. Diarrhea cannot be excluded. Scattered diverticula are noted descending colon. Scattered diverticula are noted within sigmoid colon. No evidence of acute diverticulitis. The distal sigmoid colon is collapsed. Partial liquid stool in sigmoid colon suspicious for diarrhea. Few diverticula are noted in right colon. No evidence of acute diverticulitis. Normal appendix is noted in coronal image 70 Vascular/Lymphatic: There is no aortic aneurysm. Again noted extensive atherosclerotic calcifications of abdominal aorta and iliac arteries. Atherosclerotic calcifications at SMA origin and bilateral renal artery origin. No retroperitoneal or mesenteric adenopathy. Reproductive: Prostate gland and seminal vesicles are unremarkable. Other: Again noted bilateral inguinal canal small hernia containing fat without evidence of acute complication. A superficial dermal lesion in left suprapubic region measures about 9 mm. This is stable in size in appearance from prior exam. This may represent a dermal lesion or sebaceous cyst. Clinical correlation is necessary. There is no ascites or free abdominal air. Musculoskeletal: Sagittal images of the spine shows mild degenerative changes lumbar spine. Alignment and vertebral body heights are preserved. No  destructive bony lesions are noted within pelvis. Degenerative changes bilateral SI joints. Coronal images shows bilateral hip joints with symmetrical appearance. Minimal narrowing superior hip joint space bilaterally. IMPRESSION: 1. Diffuse mild distended small bowel loops within mid abdomen and lower abdomen without transition point in caliber. No thickening of small bowel wall. Nonspecific enteritis cannot be excluded. Clinical correlation is necessary. 2. No pericecal inflammation. Unremarkable terminal ileum. Normal appendix. Small amount of liquid stool in right colon and distal sigmoid colon. Diarrhea cannot be excluded. 3. Scattered diverticula are noted right colon descending colon and sigmoid colon. No evidence of acute diverticulitis. 4. Again noted thickening of urinary bladder wall which may be due to chronic cystitis or chronic inflammation. Correlation with urology exam is recommended. 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 Stable left lateral urinary bladder diverticulum measures about 3  cm. No calcified calculi are noted within bladder or within bladder diverticula. 5. Atherosclerotic calcifications of abdominal aorta and iliac arteries. No aortic aneurysm. 6. No hydronephrosis or hydroureter. 7. Small hiatal hernia. Electronically Signed   By: Lahoma Crocker M.D.   On: 03/20/2016 08:06   I have personally reviewed and evaluated these images and lab results as part of my medical decision-making.    MDM   Final diagnoses:  Nausea vomiting and diarrhea    Patient with moderate to severe abdominal pain with guarding.  Symptom onset yesterday.  Will check labs and treat pain.  Check CT give amount of pain.  CT scan consistent with enteritis.  Also concern for chronic UTI.  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  Will give additional liter of fluid and plan  for discharge to home.    Montine Circle, PA-C 03/20/16 1043  Sherwood Gambler, MD 03/23/16 [phone]

## 2016-03-20 NOTE — ED Notes (Signed)
Rob Pa at bedside

## 2016-03-21 LAB — URINE CULTURE

## 2016-05-21 ENCOUNTER — Emergency Department (HOSPITAL_COMMUNITY)
Admission: EM | Admit: 2016-05-21 | Discharge: 2016-05-21 | Disposition: A | Discharge status: Home / Self Care | Payer: Medicaid Other | Attending: Emergency Medicine | Admitting: Emergency Medicine

## 2016-05-21 ENCOUNTER — Encounter (HOSPITAL_COMMUNITY): Payer: Self-pay | Admitting: Emergency Medicine

## 2016-05-21 DIAGNOSIS — Z8673 Personal history of transient ischemic attack (TIA), and cerebral infarction without residual deficits: Secondary | ICD-10-CM | POA: Diagnosis not present

## 2016-05-21 DIAGNOSIS — Z7982 Long term (current) use of aspirin: Secondary | ICD-10-CM | POA: Diagnosis not present

## 2016-05-21 DIAGNOSIS — M5442 Lumbago with sciatica, left side: Secondary | ICD-10-CM | POA: Diagnosis not present

## 2016-05-21 DIAGNOSIS — F17210 Nicotine dependence, cigarettes, uncomplicated: Secondary | ICD-10-CM | POA: Insufficient documentation

## 2016-05-21 DIAGNOSIS — I509 Heart failure, unspecified: Secondary | ICD-10-CM | POA: Diagnosis not present

## 2016-05-21 DIAGNOSIS — G8929 Other chronic pain: Secondary | ICD-10-CM | POA: Diagnosis not present

## 2016-05-21 DIAGNOSIS — E119 Type 2 diabetes mellitus without complications: Secondary | ICD-10-CM | POA: Diagnosis not present

## 2016-05-21 DIAGNOSIS — Z86011 Personal history of benign neoplasm of the brain: Secondary | ICD-10-CM | POA: Diagnosis not present

## 2016-05-21 DIAGNOSIS — Z792 Long term (current) use of antibiotics: Secondary | ICD-10-CM | POA: Insufficient documentation

## 2016-05-21 DIAGNOSIS — Z79899 Other long term (current) drug therapy: Secondary | ICD-10-CM | POA: Insufficient documentation

## 2016-05-21 DIAGNOSIS — M25552 Pain in left hip: Secondary | ICD-10-CM | POA: Diagnosis present

## 2016-05-21 DIAGNOSIS — I10 Essential (primary) hypertension: Secondary | ICD-10-CM | POA: Insufficient documentation

## 2016-05-21 DIAGNOSIS — Z7984 Long term (current) use of oral hypoglycemic drugs: Secondary | ICD-10-CM | POA: Insufficient documentation

## 2016-05-21 MED ORDER — MORPHINE SULFATE (PF) 4 MG/ML IV SOLN
4.0000 mg | Freq: Once | INTRAVENOUS | Status: AC
  Administered 2016-05-21: 4 mg via INTRAVENOUS
  Filled 2016-05-21: qty 1

## 2016-05-21 MED ORDER — HYDROCODONE-ACETAMINOPHEN 5-325 MG PO TABS: 1.0000 | ORAL_TABLET | Freq: Four times a day (QID) | ORAL | Status: DC | PRN

## 2016-05-21 MED ORDER — CYCLOBENZAPRINE HCL 10 MG PO TABS: 10.0000 mg | ORAL_TABLET | Freq: Three times a day (TID) | ORAL | Status: DC | PRN

## 2016-05-21 MED ORDER — DIAZEPAM 5 MG PO TABS
5.0000 mg | ORAL_TABLET | Freq: Three times a day (TID) | ORAL | Status: DC | PRN
Start: 2016-05-21 — End: 2016-05-21

## 2016-05-21 MED ORDER — KETOROLAC TROMETHAMINE 30 MG/ML IJ SOLN
15.0000 mg | Freq: Once | INTRAMUSCULAR | Status: AC
  Administered 2016-05-21: 15 mg via INTRAVENOUS
  Filled 2016-05-21: qty 1

## 2016-05-21 NOTE — ED Provider Notes (Signed)
CSN: [id number]     Arrival date & time 05/21/16  0451 History   First MD Initiated Contact with Patient 05/21/16 0636     Chief Complaint  Patient presents with  . Hip Pain     (Consider location/radiation/quality/duration/timing/severity/associated sxs/prior Treatment) HPI Patient presents to the emergency department with back pain and left hip pain.  The patient states that this happened previously.  He states that is a chronic issue for him.  The patient states he does not have any alleviating factors.  States movement and palpation make the pain worse.  He states that he did not take any medications prior to arrival.  He states that it woke him up from sleep. The patient denies chest pain, shortness of breath, headache,blurred vision, neck pain, fever, cough, weakness, numbness, dizziness, anorexia, edema, abdominal pain, nausea, vomiting, diarrhea, rash, , dysuria, hematemesis, bloody stool, near syncope, or syncope. Past Medical History  Diagnosis Date  . Hypertension   . Diabetes mellitus   . Brain tumor ([place])   . CVA (cerebral vascular accident) ([place])   . Narcotic abuse   . TIA (transient ischemic attack) 09/06/2012  . Vertigo 09/06/2012  . HTN (hypertension), malignant 09/06/2012  . Tobacco use disorder 12/10/2013  . Alcohol dependence ([place]) 12/10/2013  . Angioedema of lips 12/24/2013  . Chronic headache   . Chronic back pain   . CHF (congestive heart failure) (HCC)     diastolic  . Normal cardiac stress test 2009   Past Surgical History  Procedure Laterality Date  . No past surgeries     Family History  Problem Relation Age of Onset  . CAD Father     "heart attack in his 79's"  . Hypertension Father   . Diabetes Brother   . Diabetes Sister   . Hypertension Mother    Social History  Substance Use Topics  . Smoking status: Current Every Day Smoker -- 0.50 packs/day    Types: Cigarettes  . Smokeless tobacco: Current User  . Alcohol Use: 0.0 oz/week    0 Standard  drinks or equivalent per week     Comment: 6 pack a week    Review of Systems  All other systems negative except as documented in the HPI. All pertinent positives and negatives as reviewed in the HPI.   Allergies  Review of patient's allergies indicates no known allergies.  Home Medications   Prior to Admission medications   Medication Sig Start Date End Date Taking? Authorizing Provider  acetaminophen (TYLENOL) 500 MG tablet Take 500 mg by mouth every 6 (six) hours as needed for headache.    Historical Provider, MD  amLODipine (NORVASC) 10 MG tablet Take 10 mg by mouth daily.    Historical Provider, MD  aspirin EC 81 MG tablet Take 81 mg by mouth daily.    Historical Provider, MD  b complex vitamins tablet Take 1 tablet by mouth daily.    Historical Provider, MD  cefdinir (OMNICEF) 300 MG capsule Take 1 capsule (300 mg total) by mouth 2 (two) times daily. Patient not taking: Reported on 03/09/2016 01/03/16   [name], MD  cephALEXin (KEFLEX) 500 MG capsule Take 1 capsule (500 mg total) by mouth 4 (four) times daily. 03/09/16   [name], MD  hydrALAZINE (APRESOLINE) 100 MG tablet Take 1 tablet (100 mg total) by mouth every 8 (eight) hours. 01/30/15   Reyne Dumas, MD  HYDROcodone-acetaminophen (NORCO/VICODIN) 5-325 MG tablet Take 1 tablet by mouth every 6 (six) hours as  needed. 03/20/16   Montine Circle, PA-C  isosorbide dinitrate (ISORDIL) 10 MG tablet Take 1 tablet (10 mg total) by mouth 3 (three) times daily. 12/27/13   Debbe Odea, MD  metFORMIN (GLUCOPHAGE) 500 MG tablet Take 500 mg by mouth 2 (two) times daily with a meal.    Historical Provider, MD  naproxen (NAPROSYN) 375 MG tablet Take 1 tablet (375 mg total) by mouth 2 (two) times daily. Patient not taking: Reported on 03/09/2016 01/03/16   [name], MD  ondansetron (ZOFRAN ODT) 4 MG disintegrating tablet 4mg  ODT q4 hours prn nausea/vomit Patient not taking: Reported on 03/09/2016 07/02/15   [name], MD    ondansetron (ZOFRAN) 4 MG tablet Take 1 tablet (4 mg total) by mouth every 6 (six) hours. 03/20/16   Montine Circle, PA-C  pantoprazole (PROTONIX) 20 MG tablet Take 1 tablet (20 mg total) by mouth daily. 07/02/15   [name], MD  pravastatin (PRAVACHOL) 20 MG tablet Take 20 mg by mouth at bedtime.    Historical Provider, MD  tamsulosin (FLOMAX) 0.4 MG CAPS capsule Take 1 capsule (0.4 mg total) by mouth daily. 08/06/15   [name], PA-C   BP 166/59 mmHg  Pulse 67  Temp(Src) 98.2 F (36.8 C) (Oral)  Resp 15  SpO2 96% Physical Exam  Constitutional: He is oriented to person, place, and time. He appears well-developed and well-nourished. No distress.  HENT:  Head: Normocephalic and atraumatic.  Mouth/Throat: Oropharynx is clear and moist.  Eyes: Pupils are equal, round, and reactive to light.  Neck: Normal range of motion. Neck supple.  Cardiovascular: Normal rate, regular rhythm and normal heart sounds.  Exam reveals no gallop and no friction rub.   No murmur heard. Pulmonary/Chest: Effort normal and breath sounds normal. No respiratory distress. He has no wheezes.  Abdominal: Soft. Bowel sounds are normal. He exhibits no distension. There is no tenderness.  Musculoskeletal:       Back:       Legs: Neurological: He is alert and oriented to person, place, and time. He has normal strength and normal reflexes. No sensory deficit. He exhibits normal muscle tone. Coordination and gait normal. GCS eye subscore is 4. GCS verbal subscore is 5. GCS motor subscore is 6.  Skin: Skin is warm and dry. No rash noted. No erythema.  Psychiatric: He has a normal mood and affect. His behavior is normal.  Nursing note and vitals reviewed.   ED Course  Procedures (including critical care time) Labs Review Labs Reviewed - No data to display  Imaging Review No results found. I have personally reviewed and evaluated these images and lab results as part of my medical decision-making.  Patient  was treated for his pain and referred back to his primary care doctor.  There is no neurological deficits noted on exam.  He has normal reflexes.  He does not have any bowel or bladder incontinence   Dalia Heading, PA-C 05/26/16 1611  Tanna Furry, MD 06/05/16 [phone]

## 2016-05-21 NOTE — Discharge Instructions (Signed)
Return here as needed. Follow up with your doctor. °

## 2016-05-21 NOTE — ED Notes (Signed)
Pt arrives with c/o sharp R hip pain x3 days. Says pain is 10/10 and radiates from sacral region to hip. Pt denies any injury to this area. A&Ox4, VSS

## 2016-06-25 ENCOUNTER — Encounter (HOSPITAL_COMMUNITY): Payer: Self-pay

## 2016-06-25 ENCOUNTER — Emergency Department (HOSPITAL_COMMUNITY)
Admission: EM | Admit: 2016-06-25 | Discharge: 2016-06-25 | Disposition: A | Discharge status: Home / Self Care | Payer: Medicaid Other | Attending: Emergency Medicine | Admitting: Emergency Medicine

## 2016-06-25 DIAGNOSIS — Z79899 Other long term (current) drug therapy: Secondary | ICD-10-CM | POA: Diagnosis not present

## 2016-06-25 DIAGNOSIS — N390 Urinary tract infection, site not specified: Secondary | ICD-10-CM | POA: Insufficient documentation

## 2016-06-25 DIAGNOSIS — R300 Dysuria: Secondary | ICD-10-CM | POA: Diagnosis present

## 2016-06-25 DIAGNOSIS — N289 Disorder of kidney and ureter, unspecified: Secondary | ICD-10-CM | POA: Insufficient documentation

## 2016-06-25 DIAGNOSIS — I110 Hypertensive heart disease with heart failure: Secondary | ICD-10-CM | POA: Diagnosis not present

## 2016-06-25 DIAGNOSIS — R319 Hematuria, unspecified: Secondary | ICD-10-CM | POA: Diagnosis not present

## 2016-06-25 DIAGNOSIS — Z7982 Long term (current) use of aspirin: Secondary | ICD-10-CM | POA: Insufficient documentation

## 2016-06-25 DIAGNOSIS — Z7984 Long term (current) use of oral hypoglycemic drugs: Secondary | ICD-10-CM | POA: Insufficient documentation

## 2016-06-25 DIAGNOSIS — E119 Type 2 diabetes mellitus without complications: Secondary | ICD-10-CM | POA: Insufficient documentation

## 2016-06-25 DIAGNOSIS — F17210 Nicotine dependence, cigarettes, uncomplicated: Secondary | ICD-10-CM | POA: Diagnosis not present

## 2016-06-25 DIAGNOSIS — I509 Heart failure, unspecified: Secondary | ICD-10-CM | POA: Insufficient documentation

## 2016-06-25 DIAGNOSIS — Z8673 Personal history of transient ischemic attack (TIA), and cerebral infarction without residual deficits: Secondary | ICD-10-CM | POA: Insufficient documentation

## 2016-06-25 LAB — URINALYSIS, ROUTINE W REFLEX MICROSCOPIC
Bilirubin Urine: NEGATIVE
Glucose, UA: NEGATIVE mg/dL
KETONES UR: 15 mg/dL — AB
Nitrite: NEGATIVE
Specific Gravity, Urine: 1.016 (ref 1.005–1.030)
pH: 6 (ref 5.0–8.0)

## 2016-06-25 LAB — BASIC METABOLIC PANEL
ANION GAP: 8 (ref 5–15)
BUN: 26 mg/dL — AB (ref 6–20)
CALCIUM: 9.8 mg/dL (ref 8.9–10.3)
CO2: 25 mmol/L (ref 22–32)
Chloride: 106 mmol/L (ref 101–111)
Creatinine, Ser: 1.93 mg/dL — ABNORMAL HIGH (ref 0.61–1.24)
GFR calc Af Amer: 41 mL/min — ABNORMAL LOW (ref >60)
GFR, EST NON AFRICAN AMERICAN: 35 mL/min — AB (ref >60)
GLUCOSE: 106 mg/dL — AB (ref 65–99)
Potassium: 4.7 mmol/L (ref 3.5–5.1)
Sodium: 139 mmol/L (ref 135–145)

## 2016-06-25 LAB — CBC WITH DIFFERENTIAL/PLATELET
BASOS PCT: 1 %
Basophils Absolute: 0.1 10*3/uL (ref 0.0–0.1)
EOS ABS: 0.4 10*3/uL (ref 0.0–0.7)
EOS PCT: 4 %
HEMATOCRIT: 41.1 % (ref 39.0–52.0)
Hemoglobin: 13.4 g/dL (ref 13.0–17.0)
LYMPHS PCT: 33 %
Lymphs Abs: 3.1 10*3/uL (ref 0.7–4.0)
MCH: 23.6 pg — AB (ref 26.0–34.0)
MCHC: 32.6 g/dL (ref 30.0–36.0)
MCV: 72.5 fL — AB (ref 78.0–100.0)
MONO ABS: 0.8 10*3/uL (ref 0.1–1.0)
Monocytes Relative: 8 %
NEUTROS ABS: 5.1 10*3/uL (ref 1.7–7.7)
NEUTROS PCT: 54 %
PLATELETS: 281 10*3/uL (ref 150–400)
RBC: 5.67 MIL/uL (ref 4.22–5.81)
RDW: 16.1 % — ABNORMAL HIGH (ref 11.5–15.5)
WBC: 9.5 10*3/uL (ref 4.0–10.5)

## 2016-06-25 LAB — URINE MICROSCOPIC-ADD ON

## 2016-06-25 MED ORDER — HYDROCODONE-ACETAMINOPHEN 5-325 MG PO TABS
1.0000 | ORAL_TABLET | ORAL | Status: AC
  Administered 2016-06-25: 1 via ORAL
  Filled 2016-06-25: qty 1

## 2016-06-25 MED ORDER — DEXTROSE 5 % IV SOLN: 1.0000 g | Freq: Once | INTRAVENOUS | Status: DC

## 2016-06-25 MED ORDER — CEFTRIAXONE SODIUM 1 G IJ SOLR
1.0000 g | Freq: Once | INTRAMUSCULAR | Status: AC
  Administered 2016-06-25: 1 g via INTRAMUSCULAR
  Filled 2016-06-25: qty 10

## 2016-06-25 MED ORDER — CEPHALEXIN 500 MG PO CAPS: 500.0000 mg | ORAL_CAPSULE | Freq: Four times a day (QID) | ORAL | Status: DC

## 2016-06-25 NOTE — ED Notes (Signed)
Patient complains of hematuria and dysuria since last pm, no other associated symptoms

## 2016-06-25 NOTE — ED Provider Notes (Signed)
CSN: [id number]     Arrival date & time 06/25/16  1021 History   First MD Initiated Contact with Patient 06/25/16 1117     Chief Complaint  Patient presents with  . Hematuria  . Dysuria    HPI Pt noticed blood in his urine yesterday.   It got worse today.   It burns when he urinates.   He has had to urinate more than normal.  Pt called his doctor and was told to come to the ED.  He denies fever, or vomiting but he felt nauseated.  He had this in the past and was diagnosed with a UTI.  He had a CT scan which showed "4. Again noted thickening of urinary bladder wall which may be due to chronic cystitis or chronic inflammation. Correlation with urology exam is recommended. 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 Stable left lateral urinary bladder diverticulum measures about 3 cm. No calcified calculi are noted within bladder or within bladder diverticula."  He is scheduled to see urology. Past Medical History  Diagnosis Date  . Hypertension   . Diabetes mellitus   . Brain tumor ([place])   . CVA (cerebral vascular accident) ([place])   . Narcotic abuse   . TIA (transient ischemic attack) 09/06/2012  . Vertigo 09/06/2012  . HTN (hypertension), malignant 09/06/2012  . Tobacco use disorder 12/10/2013  . Alcohol dependence ([place]) 12/10/2013  . Angioedema of lips 12/24/2013  . Chronic headache   . Chronic back pain   . CHF (congestive heart failure) (H[facility])     diastolic  . Normal cardiac stress test 2009   Past Surgical History  Procedure Laterality Date  . No past surgeries     Family History  Problem Relation Age of Onset  . CAD Father     "heart attack in his 74's"  . Hypertension Father   . Diabetes Brother   . Diabetes Sister   . Hypertension Mother    Social History  Substance Use Topics  . Smoking status: Current Every Day Smoker -- 0.50 packs/day    Types: Cigarettes  . Smokeless tobacco: Current User  . Alcohol Use: 0.0 oz/week    0  Standard drinks or equivalent per week     Comment: 6 pack a week    Review of Systems  All other systems reviewed and are negative.     Allergies  Review of patient's allergies indicates no known allergies.  Home Medications   Prior to Admission medications   Medication Sig Start Date End Date Taking? Authorizing Provider  amLODipine (NORVASC) 10 MG tablet Take 10 mg by mouth daily.   Yes Historical Provider, MD  aspirin EC 81 MG tablet Take 81 mg by mouth daily.   Yes Historical Provider, MD  b complex vitamins tablet Take 1 tablet by mouth daily.   Yes Historical Provider, MD  cyclobenzaprine (FLEXERIL) 10 MG tablet Take 1 tablet (10 mg total) by mouth 3 (three) times daily as needed for muscle spasms. 05/21/16  Yes Christopher Lawyer, PA-C  gabapentin (NEURONTIN) 100 MG capsule Take 100 mg by mouth 3 (three) times daily. 06/10/16  Yes Historical Provider, MD  hydrALAZINE (APRESOLINE) 100 MG tablet Take 1 tablet (100 mg total) by mouth every 8 (eight) hours. 01/30/15  Yes Reyne Dumas, MD  HYDROcodone-acetaminophen (NORCO/VICODIN) 5-325 MG tablet Take 1 tablet by mouth every 6 (six) hours as needed for moderate pain. 05/21/16  Yes Dalia Heading, PA-C  ibuprofen (ADVIL,MOTRIN)  800 MG tablet Take 800 mg by mouth every 8 (eight) hours as needed. pain 06/10/16  Yes Historical Provider, MD  isosorbide dinitrate (ISORDIL) 10 MG tablet Take 1 tablet (10 mg total) by mouth 3 (three) times daily. 12/27/13  Yes Debbe Odea, MD  metFORMIN (GLUCOPHAGE) 500 MG tablet Take 500 mg by mouth 2 (two) times daily with a meal.   Yes Historical Provider, MD  naproxen (NAPROSYN) 375 MG tablet Take 1 tablet (375 mg total) by mouth 2 (two) times daily. 01/03/16  Yes Ankit Nanavati, MD  ondansetron (ZOFRAN) 4 MG tablet Take 1 tablet (4 mg total) by mouth every 6 (six) hours. 03/20/16  Yes Montine Circle, PA-C  oxyCODONE-acetaminophen (PERCOCET/ROXICET) 5-325 MG tablet Take 1 tablet by mouth daily as needed.  pain 06/10/16  Yes Historical Provider, MD  pantoprazole (PROTONIX) 20 MG tablet Take 1 tablet (20 mg total) by mouth daily. 07/02/15  Yes [name], MD  pravastatin (PRAVACHOL) 20 MG tablet Take 20 mg by mouth at bedtime.   Yes Historical Provider, MD  tamsulosin (FLOMAX) 0.4 MG CAPS capsule Take 1 capsule (0.4 mg total) by mouth daily. 08/06/15  Yes [name], PA-C  cefdinir (OMNICEF) 300 MG capsule Take 1 capsule (300 mg total) by mouth 2 (two) times daily. Patient not taking: Reported on 03/09/2016 01/03/16   [name], MD  cephALEXin (KEFLEX) 500 MG capsule Take 1 capsule (500 mg total) by mouth 4 (four) times daily. 06/25/16   Dorie Rank, MD  ondansetron (ZOFRAN ODT) 4 MG disintegrating tablet 4mg  ODT q4 hours prn nausea/vomit Patient not taking: Reported on 06/25/2016 07/02/15   [name], MD   BP 147/64 mmHg  Pulse 79  Temp(Src) 98.3 F (36.8 C) (Oral)  Resp 18  Wt 74.844 kg  SpO2 99% Physical Exam  Constitutional: He appears well-developed and well-nourished. No distress.  HENT:  Head: Normocephalic and atraumatic.  Right Ear: External ear normal.  Left Ear: External ear normal.  Eyes: Conjunctivae are normal. Right eye exhibits no discharge. Left eye exhibits no discharge. No scleral icterus.  Neck: Neck supple. No tracheal deviation present.  Cardiovascular: Normal rate, regular rhythm and intact distal pulses.   Pulmonary/Chest: Effort normal and breath sounds normal. No stridor. No respiratory distress. He has no wheezes. He has no rales.  Abdominal: Soft. Bowel sounds are normal. He exhibits no distension. There is tenderness in the suprapubic area. There is guarding. There is no rigidity and no rebound.  Musculoskeletal: He exhibits no edema or tenderness.  Neurological: He is alert. He has normal strength. No cranial nerve deficit (no facial droop, extraocular movements intact, no slurred speech) or sensory deficit. He exhibits normal muscle tone. He displays  no seizure activity. Coordination normal.  Skin: Skin is warm and dry. No rash noted.  Psychiatric: He has a normal mood and affect.  Nursing note and vitals reviewed.   ED Course  Procedures (including critical care time) Labs Review Labs Reviewed  URINALYSIS, ROUTINE W REFLEX MICROSCOPIC (NOT AT [facility]) - Abnormal; Notable for the following:    Color, Urine RED (*)    APPearance TURBID (*)    Hgb urine dipstick LARGE (*)    Ketones, ur 15 (*)    Protein, ur >300 (*)    Leukocytes, UA LARGE (*)    All other components within normal limits  URINE MICROSCOPIC-ADD ON - Abnormal; Notable for the following:    Squamous Epithelial / LPF 0-5 (*)    Bacteria, UA MANY (*)  All other components within normal limits  CBC WITH DIFFERENTIAL/PLATELET - Abnormal; Notable for the following:    MCV 72.5 (*)    MCH 23.6 (*)    RDW 16.1 (*)    All other components within normal limits  BASIC METABOLIC PANEL - Abnormal; Notable for the following:    Glucose, Bld 106 (*)    BUN 26 (*)    Creatinine, Ser 1.93 (*)    GFR calc non Af Amer 35 (*)    GFR calc Af Amer 41 (*)    All other components within normal limits  URINE CULTURE     MDM   Final diagnoses:  UTI (lower urinary tract infection)  Renal insufficiency   Patient's urinalysis suggest urinary tract infection. Have a component of renal insufficiency. CBC is unremarkable.  Patient was given a dose of Rocephin. A urine culture was sent off for analysis. We'll discharge him home on a course of Keflex. Patient does have follow-up with a urologist. I reviewed a prior CT scan that did show some evidence of inflammation in the bladder but no renal mass. Outpatient urology follow-up is indicated.    Dorie Rank, MD 06/25/16 1251

## 2016-06-25 NOTE — Discharge Instructions (Signed)

## 2016-06-26 LAB — URINE CULTURE: Culture: 3000 — AB

## 2016-07-05 ENCOUNTER — Emergency Department (HOSPITAL_COMMUNITY)
Admission: EM | Admit: 2016-07-05 | Discharge: 2016-07-05 | Disposition: A | Discharge status: Home / Self Care | Payer: Medicaid Other | Attending: Emergency Medicine | Admitting: Emergency Medicine

## 2016-07-05 ENCOUNTER — Emergency Department (HOSPITAL_COMMUNITY): Payer: Medicaid Other

## 2016-07-05 ENCOUNTER — Encounter (HOSPITAL_COMMUNITY): Payer: Self-pay | Admitting: Emergency Medicine

## 2016-07-05 DIAGNOSIS — F17210 Nicotine dependence, cigarettes, uncomplicated: Secondary | ICD-10-CM | POA: Diagnosis not present

## 2016-07-05 DIAGNOSIS — Z7982 Long term (current) use of aspirin: Secondary | ICD-10-CM | POA: Insufficient documentation

## 2016-07-05 DIAGNOSIS — N390 Urinary tract infection, site not specified: Secondary | ICD-10-CM

## 2016-07-05 DIAGNOSIS — Z79899 Other long term (current) drug therapy: Secondary | ICD-10-CM | POA: Insufficient documentation

## 2016-07-05 DIAGNOSIS — I110 Hypertensive heart disease with heart failure: Secondary | ICD-10-CM | POA: Insufficient documentation

## 2016-07-05 DIAGNOSIS — I5030 Unspecified diastolic (congestive) heart failure: Secondary | ICD-10-CM | POA: Diagnosis not present

## 2016-07-05 DIAGNOSIS — Z8673 Personal history of transient ischemic attack (TIA), and cerebral infarction without residual deficits: Secondary | ICD-10-CM | POA: Diagnosis not present

## 2016-07-05 DIAGNOSIS — E119 Type 2 diabetes mellitus without complications: Secondary | ICD-10-CM | POA: Insufficient documentation

## 2016-07-05 DIAGNOSIS — R339 Retention of urine, unspecified: Secondary | ICD-10-CM | POA: Diagnosis not present

## 2016-07-05 DIAGNOSIS — R1030 Lower abdominal pain, unspecified: Secondary | ICD-10-CM

## 2016-07-05 DIAGNOSIS — R1031 Right lower quadrant pain: Secondary | ICD-10-CM | POA: Diagnosis not present

## 2016-07-05 DIAGNOSIS — R1032 Left lower quadrant pain: Secondary | ICD-10-CM | POA: Diagnosis not present

## 2016-07-05 DIAGNOSIS — D496 Neoplasm of unspecified behavior of brain: Secondary | ICD-10-CM | POA: Insufficient documentation

## 2016-07-05 DIAGNOSIS — Z7984 Long term (current) use of oral hypoglycemic drugs: Secondary | ICD-10-CM | POA: Diagnosis not present

## 2016-07-05 LAB — COMPREHENSIVE METABOLIC PANEL
ALBUMIN: 3.8 g/dL (ref 3.5–5.0)
ALK PHOS: 52 U/L (ref 38–126)
ALT: 31 U/L (ref 17–63)
AST: 30 U/L (ref 15–41)
Anion gap: 10 (ref 5–15)
BUN: 23 mg/dL — ABNORMAL HIGH (ref 6–20)
CALCIUM: 9.7 mg/dL (ref 8.9–10.3)
CHLORIDE: 104 mmol/L (ref 101–111)
CO2: 21 mmol/L — AB (ref 22–32)
CREATININE: 1.42 mg/dL — AB (ref 0.61–1.24)
GFR calc Af Amer: 59 mL/min — ABNORMAL LOW (ref >60)
GFR calc non Af Amer: 51 mL/min — ABNORMAL LOW (ref >60)
GLUCOSE: 99 mg/dL (ref 65–99)
Potassium: 4.4 mmol/L (ref 3.5–5.1)
SODIUM: 135 mmol/L (ref 135–145)
Total Bilirubin: 0.3 mg/dL (ref 0.3–1.2)
Total Protein: 7.4 g/dL (ref 6.5–8.1)

## 2016-07-05 LAB — CBC WITH DIFFERENTIAL/PLATELET
BASOS PCT: 0 %
Basophils Absolute: 0 10*3/uL (ref 0.0–0.1)
EOS ABS: 0.5 10*3/uL (ref 0.0–0.7)
EOS PCT: 3 %
HCT: 37 % — ABNORMAL LOW (ref 39.0–52.0)
HEMOGLOBIN: 11.9 g/dL — AB (ref 13.0–17.0)
LYMPHS PCT: 21 %
Lymphs Abs: 3.3 10*3/uL (ref 0.7–4.0)
MCH: 24 pg — AB (ref 26.0–34.0)
MCHC: 32.2 g/dL (ref 30.0–36.0)
MCV: 74.7 fL — AB (ref 78.0–100.0)
MONO ABS: 1.4 10*3/uL — AB (ref 0.1–1.0)
Monocytes Relative: 9 %
NEUTROS ABS: 10.6 10*3/uL — AB (ref 1.7–7.7)
Neutrophils Relative %: 67 %
Platelets: 302 10*3/uL (ref 150–400)
RBC: 4.95 MIL/uL (ref 4.22–5.81)
RDW: 15.6 % — ABNORMAL HIGH (ref 11.5–15.5)
WBC: 15.8 10*3/uL — ABNORMAL HIGH (ref 4.0–10.5)

## 2016-07-05 LAB — URINALYSIS, ROUTINE W REFLEX MICROSCOPIC
Bilirubin Urine: NEGATIVE
Glucose, UA: NEGATIVE mg/dL
Ketones, ur: NEGATIVE mg/dL
Nitrite: NEGATIVE
Protein, ur: 100 mg/dL — AB
Specific Gravity, Urine: 1.014 (ref 1.005–1.030)
pH: 7.5 (ref 5.0–8.0)

## 2016-07-05 LAB — URINE MICROSCOPIC-ADD ON

## 2016-07-05 MED ORDER — CIPROFLOXACIN HCL 500 MG PO TABS: 500.0000 mg | ORAL_TABLET | Freq: Two times a day (BID) | ORAL | Status: DC

## 2016-07-05 MED ORDER — DEXTROSE 5 % IV SOLN
1.0000 g | Freq: Once | INTRAVENOUS | Status: AC
  Administered 2016-07-05: 1 g via INTRAVENOUS
  Filled 2016-07-05: qty 10

## 2016-07-05 MED ORDER — ONDANSETRON HCL 4 MG/2ML IJ SOLN
4.0000 mg | Freq: Once | INTRAMUSCULAR | Status: AC
  Administered 2016-07-05: 4 mg via INTRAVENOUS
  Filled 2016-07-05: qty 2

## 2016-07-05 MED ORDER — SODIUM CHLORIDE 0.9 % IV BOLUS (SEPSIS)
1000.0000 mL | Freq: Once | INTRAVENOUS | Status: AC
  Administered 2016-07-05: 1000 mL via INTRAVENOUS

## 2016-07-05 MED ORDER — TAMSULOSIN HCL 0.4 MG PO CAPS: 0.4000 mg | ORAL_CAPSULE | Freq: Every day | ORAL | Status: DC

## 2016-07-05 MED ORDER — SILODOSIN 8 MG PO CAPS
8.0000 mg | ORAL_CAPSULE | Freq: Once | ORAL | Status: AC
  Administered 2016-07-05: 8 mg via ORAL
  Filled 2016-07-05: qty 1

## 2016-07-05 MED ORDER — MORPHINE SULFATE (PF) 4 MG/ML IV SOLN
4.0000 mg | Freq: Once | INTRAVENOUS | Status: AC
  Administered 2016-07-05: 4 mg via INTRAVENOUS
  Filled 2016-07-05: qty 1

## 2016-07-05 MED ORDER — IOPAMIDOL (ISOVUE-300) INJECTION 61%
INTRAVENOUS | Status: AC
  Administered 2016-07-05: 100 mL
  Filled 2016-07-05: qty 100

## 2016-07-05 MED ORDER — NON FORMULARY: 8.0000 mg | Freq: Once | Status: DC

## 2016-07-05 NOTE — ED Provider Notes (Signed)
CSN: [id number]     Arrival date & time 07/05/16  0309 History   First MD Initiated Contact with Patient 07/05/16 0327     Chief Complaint  Patient presents with  . Abdominal Pain     (Consider location/radiation/quality/duration/timing/severity/associated sxs/prior Treatment) HPI   [name] is a 65 y.o. male, with a history of CVA, CHF, DM, and hypertension, presenting to the ED with lower abdominal pain for the last week and a half, worse since yesterday. Pain is moderate and sharp. Also endorses nausea and pain with bowel movements. Patient states he is readily able to urinate, but endorses a sensation of incomplete voiding. Pt was diagnosed with, and treated for, an UTI on June 28. Pt has since finished his Keflex. Pt is scheduled for a urology office visit on August 8. Patient denies fever/chills, vomiting/diarrhea, penile discharge, scrotal pain, or any other complaints.     Past Medical History  Diagnosis Date  . Hypertension   . Diabetes mellitus   . Brain tumor ([place])   . CVA (cerebral vascular accident) ([place])   . Narcotic abuse   . TIA (transient ischemic attack) 09/06/2012  . Vertigo 09/06/2012  . HTN (hypertension), malignant 09/06/2012  . Tobacco use disorder 12/10/2013  . Alcohol dependence ([place]) 12/10/2013  . Angioedema of lips 12/24/2013  . Chronic headache   . Chronic back pain   . CHF (congestive heart failure) ([facility])     diastolic  . Normal cardiac stress test 2009   Past Surgical History  Procedure Laterality Date  . No past surgeries     Family History  Problem Relation Age of Onset  . CAD Father     "heart attack in his 7's"  . Hypertension Father   . Diabetes Brother   . Diabetes Sister   . Hypertension Mother    Social History  Substance Use Topics  . Smoking status: Current Every Day Smoker -- 0.50 packs/day    Types: Cigarettes  . Smokeless tobacco: Current User  . Alcohol Use: 0.0 oz/week    0 Standard drinks or equivalent per week      Comment: 6 pack a week    Review of Systems  Constitutional: Negative for fever, chills and diaphoresis.  Gastrointestinal: Positive for nausea and abdominal pain. Negative for vomiting, diarrhea, constipation and blood in stool.  Genitourinary: Positive for dysuria. Negative for hematuria, discharge, scrotal swelling, genital sores and testicular pain.  All other systems reviewed and are negative.     Allergies  Review of patient's allergies indicates no known allergies.  Home Medications   Prior to Admission medications   Medication Sig Start Date End Date Taking? Authorizing Provider  amLODipine (NORVASC) 10 MG tablet Take 10 mg by mouth daily.    Historical Provider, MD  aspirin EC 81 MG tablet Take 81 mg by mouth daily.    Historical Provider, MD  b complex vitamins tablet Take 1 tablet by mouth daily.    Historical Provider, MD  cefdinir (OMNICEF) 300 MG capsule Take 1 capsule (300 mg total) by mouth 2 (two) times daily. Patient not taking: Reported on 03/09/2016 01/03/16   [name], MD  cephALEXin (KEFLEX) 500 MG capsule Take 1 capsule (500 mg total) by mouth 4 (four) times daily. 06/25/16   Dorie Rank, MD  cyclobenzaprine (FLEXERIL) 10 MG tablet Take 1 tablet (10 mg total) by mouth 3 (three) times daily as needed for muscle spasms. 05/21/16   Dalia Heading, PA-C  gabapentin (NEURONTIN) 100  MG capsule Take 100 mg by mouth 3 (three) times daily. 06/10/16   Historical Provider, MD  hydrALAZINE (APRESOLINE) 100 MG tablet Take 1 tablet (100 mg total) by mouth every 8 (eight) hours. 01/30/15   Reyne Dumas, MD  HYDROcodone-acetaminophen (NORCO/VICODIN) 5-325 MG tablet Take 1 tablet by mouth every 6 (six) hours as needed for moderate pain. 05/21/16   Dalia Heading, PA-C  ibuprofen (ADVIL,MOTRIN) 800 MG tablet Take 800 mg by mouth every 8 (eight) hours as needed. pain 06/10/16   Historical Provider, MD  isosorbide dinitrate (ISORDIL) 10 MG tablet Take 1 tablet (10 mg total) by  mouth 3 (three) times daily. 12/27/13   Debbe Odea, MD  metFORMIN (GLUCOPHAGE) 500 MG tablet Take 500 mg by mouth 2 (two) times daily with a meal.    Historical Provider, MD  naproxen (NAPROSYN) 375 MG tablet Take 1 tablet (375 mg total) by mouth 2 (two) times daily. 01/03/16   [name], MD  ondansetron (ZOFRAN ODT) 4 MG disintegrating tablet 4mg  ODT q4 hours prn nausea/vomit Patient not taking: Reported on 06/25/2016 07/02/15   [name], MD  ondansetron (ZOFRAN) 4 MG tablet Take 1 tablet (4 mg total) by mouth every 6 (six) hours. 03/20/16   Montine Circle, PA-C  oxyCODONE-acetaminophen (PERCOCET/ROXICET) 5-325 MG tablet Take 1 tablet by mouth daily as needed. pain 06/10/16   Historical Provider, MD  pantoprazole (PROTONIX) 20 MG tablet Take 1 tablet (20 mg total) by mouth daily. 07/02/15   [name], MD  pravastatin (PRAVACHOL) 20 MG tablet Take 20 mg by mouth at bedtime.    Historical Provider, MD  tamsulosin (FLOMAX) 0.4 MG CAPS capsule Take 1 capsule (0.4 mg total) by mouth daily. 08/06/15   [name], PA-C   BP 165/65 mmHg  Pulse 88  Temp(Src) 99 F (37.2 C) (Oral)  Resp 18  Ht 5\' 9"  (1.753 m)  Wt 74.844 kg  BMI 24.36 kg/m2  SpO2 99% Physical Exam  Constitutional: He appears well-developed and well-nourished. No distress.  HENT:  Head: Normocephalic and atraumatic.  Eyes: Conjunctivae are normal.  Neck: Neck supple.  Cardiovascular: Normal rate, regular rhythm, normal heart sounds and intact distal pulses.   Pulmonary/Chest: Effort normal and breath sounds normal. No respiratory distress.  Abdominal: Soft. There is tenderness in the right lower quadrant, suprapubic area and left lower quadrant. There is no guarding and negative Murphy's sign.  Genitourinary: Prostate normal.  Penis, scrotum, and testicles without swelling, lesions, or tenderness. No penile discharge. Overall normal male genitalia.  No external hemorrhoids, fissures, or lesions noted. No gross  blood or stool burden. No rectal tenderness. Prostate is not enlarged or boggy and is nontender.  [employer], served as Producer, television/film/video during both exams.   Musculoskeletal: He exhibits no edema or tenderness.  Lymphadenopathy:    He has no cervical adenopathy.       Right: No inguinal adenopathy present.       Left: No inguinal adenopathy present.  Neurological: He is alert.  Skin: Skin is warm and dry. He is not diaphoretic.  Psychiatric: He has a normal mood and affect. His behavior is normal.  Nursing note and vitals reviewed.   ED Course  Procedures (including critical care time) Labs Review Labs Reviewed  URINALYSIS, ROUTINE W REFLEX MICROSCOPIC (NOT AT [facility]) - Abnormal; Notable for the following:    APPearance TURBID (*)    Hgb urine dipstick MODERATE (*)    Protein, ur 100 (*)    Leukocytes, UA LARGE (*)  All other components within normal limits  COMPREHENSIVE METABOLIC PANEL - Abnormal; Notable for the following:    CO2 21 (*)    BUN 23 (*)    Creatinine, Ser 1.42 (*)    GFR calc non Af Amer 51 (*)    GFR calc Af Amer 59 (*)    All other components within normal limits  CBC WITH DIFFERENTIAL/PLATELET - Abnormal; Notable for the following:    WBC 15.8 (*)    Hemoglobin 11.9 (*)    HCT 37.0 (*)    MCV 74.7 (*)    MCH 24.0 (*)    RDW 15.6 (*)    Neutro Abs 10.6 (*)    Monocytes Absolute 1.4 (*)    All other components within normal limits  URINE MICROSCOPIC-ADD ON - Abnormal; Notable for the following:    Squamous Epithelial / LPF 0-5 (*)    Bacteria, UA FEW (*)    All other components within normal limits  URINE CULTURE  HIV ANTIBODY (ROUTINE TESTING)  POC OCCULT BLOOD, ED  GC/CHLAMYDIA PROBE AMP ([place]) NOT AT [facility]    Imaging Review No results found. I have personally reviewed and evaluated these images and lab results as part of my medical decision-making.   EKG Interpretation None      Medications  cefTRIAXone (ROCEPHIN) 1 g in dextrose 5  % 50 mL IVPB (not administered)  silodosin (RAPAFLO) capsule 8 mg (not administered)  sodium chloride 0.9 % bolus 1,000 mL (0 mLs Intravenous Stopped 07/05/16 0708)  morphine 4 MG/ML injection 4 mg (4 mg Intravenous Given 07/05/16 0522)  ondansetron (ZOFRAN) injection 4 mg (4 mg Intravenous Given 07/05/16 0522)    MDM   Final diagnoses:  Lower abdominal pain  Urinary retention    [name] presents with lower abdominal pain and urinary discomfort for the past week.  Findings and plan of care discussed with Jola Schmidt, MD.   Patient with AKI, UTI, and urinary retention. Patient's post void residual is 400 mL. AKI has improved since June 28. Urology consult placed. 5:52 AM Spoke with Dr. Jeffie Pollock, Urologist, who recommends 8mg  of Rapaflo here in the ED. Discharge with Flomax and instructions to follow up with urology next week. Can send home with a Foley catheter for comfort over the weekend. This information was communicated with the patient. Patient is unsure about the foley catheter. Pros and cons discussed. Pt agreed to the procedure.  End of shift patient care handoff report given to [facility], PA-C. Plan: Review CT results, assure patient gets a Foley catheter placed, and that he has instructions to follow-up with Dr. Jeffie Pollock next week. Patient will have to call to set up an appointment. Patient to be discharged with Flomax. Assure that the nurse has explained to the patient how to care for his foley catheter at home.     Filed Vitals:   07/05/16 0320 07/05/16 0700  BP: 165/65 147/53  Pulse: 88 86  Temp: 99 F (37.2 C)   TempSrc: Oral   Resp: 18 16  Height: 5\' 9"  (1.753 m)   Weight: 74.844 kg   SpO2: 99% 96%     Lorayne Bender, PA-C 07/05/16 0715  Jola Schmidt, MD 07/05/16 [phone]

## 2016-07-05 NOTE — ED Notes (Signed)
Patient having abdominal pain and pain with urination.  He states that he was being treated last week for a UTI, but he continues to have dysuria and doesn't feel any better.  No fevers at this time.  Patient denies any vomiting, but he has had some nausea.  He states that he has been having some dizzy spells with the pain.

## 2016-07-05 NOTE — ED Provider Notes (Signed)
Care assumed from previous provider PA Joy. Please see note for further details. Case discussed, plan agreed upon. Patient presented to ED with dysuria and urinary retention. Case discussed with urology, Dr. Jeffie Pollock who recommends flomax, foley catheter for comfort, and urology follow up next week. All labs reviewed by me. UA with signs of infection.  Will follow up on pending CT and dispo appropriately.   CT results with bladder wall thickening and bladder diverticuli. Results discussed with attending, Dr. Venora Maples. Will treat with Cipro. Again, patient will follow up with urology next week. Instructions on follow-up discussed with patient and included in discharge summary and he expresses understanding and agreement with plan. Foley again discussed with patient. Per nursing staff, he has urinated 1100 cc since arrival without cath assistance. Patient is adamant that he does not want Foley placed at this time. He states that he is "into much pain down there" and his "nerves are too sore". Risks and benefits of Foley placement discussed with patient. I was very clear that without foley, he may need to come back to the ER tomorrow for retention. Given that patient has urinated several times in ED and declining Foley placement which I recommended, will discharge him without foley, however a significant amount of time was taken to discuss strict return precautions with the patient. Rx for flomax given as well. Patient stable for discharge. All questions answered.   [facility] Samuel Koning, PA-C 07/05/16 [place], MD 07/06/16 [phone]

## 2016-07-05 NOTE — Discharge Instructions (Signed)
Please take all of your antibiotics until finished!  Call the urology clinic listed first thing Monday morning to schedule a follow up appointment within 1 week.  Return to ER if you are unable to urinate, develop a fever, begin having episodes of vomiting, new or worsening symptoms, any additional concerns.

## 2016-07-05 NOTE — ED Notes (Signed)
Attempted IV x1, unsuccessful.  

## 2016-07-05 NOTE — ED Notes (Signed)
Pt. Refusing foley. Pt. Educated on what to do if he is unable to urinate while waiting to see urology.

## 2016-07-07 LAB — URINE CULTURE: Culture: >=100000 — AB

## 2016-07-08 ENCOUNTER — Telehealth (HOSPITAL_COMMUNITY): Payer: Self-pay

## 2016-07-08 LAB — HIV ANTIBODY (ROUTINE TESTING W REFLEX): HIV Screen 4th Generation wRfx: NONREACTIVE

## 2016-07-08 NOTE — Progress Notes (Signed)
ED Antimicrobial Stewardship Positive Culture Follow Up   [name] is an 65 y.o. male who presented to [facility] on 07/05/2016 with a chief complaint of  Chief Complaint  Patient presents with  . Abdominal Pain    Recent Results (from the past 720 hour(s))  Urine culture     Status: Abnormal   Collection Time: 06/25/16 10:35 AM  Result Value Ref Range Status   Specimen Description URINE, CLEAN CATCH  Final   Special Requests NONE  Final   Culture 3,000 COLONIES/mL INSIGNIFICANT GROWTH (A)  Final   Report Status 06/26/2016 FINAL  Final  Urine culture     Status: Abnormal   Collection Time: 07/05/16  4:45 AM  Result Value Ref Range Status   Specimen Description URINE, CLEAN CATCH  Final   Special Requests NONE  Final   Culture (A)  Final    >=100,000 COLONIES/mL VANCOMYCIN RESISTANT ENTEROCOCCUS   Report Status 07/07/2016 FINAL  Final   Organism ID, Bacteria VANCOMYCIN RESISTANT ENTEROCOCCUS (A)  Final      Susceptibility   Vancomycin resistant enterococcus - MIC*    AMPICILLIN >=32 RESISTANT Resistant     LEVOFLOXACIN >=8 RESISTANT Resistant     NITROFURANTOIN 256 RESISTANT Resistant     VANCOMYCIN >=32 RESISTANT Resistant     GENTAMICIN SYNERGY SENSITIVE Sensitive     LINEZOLID 2 SENSITIVE Sensitive     * >=100,000 COLONIES/mL VANCOMYCIN RESISTANT ENTEROCOCCUS    [x]  Treated with Cipro, organism resistant to prescribed antimicrobial  76 YOM who presented on 7/8 with persistent UTI symptoms after being seen 1 week prior and starting Keflex. CT at that time showed bladder inflammation. Urology was called and recommended Flomax with a catheter over the weekend for comfort/relief - which the patient refused. The patient was supposed to call and get a follow-up appointment with Dr. Jeffie Pollock early this week.  Urine cultures have grown VRE - and will need to be switched. Cultures will also need to be faxed over to Dr. Ralene Muskrat office. The flow manager is to follow-up on this.    Dr. Irine Seal [facility] [address] [place], [place] [address] Phone # [phone] Fax # [phone]  New antibiotic prescription: D/c Cipro and start Zyvox 600 mg po twice daily for 14 days. Fax results over to urology and ensure the patient has a follow-up visit.   ED Provider: Gloriann Loan, PA-C  [name] 07/08/2016, 12:01 PM Infectious Diseases Pharmacist Phone# [phone]

## 2016-07-08 NOTE — Telephone Encounter (Signed)
Post ED Visit - Positive Culture Follow-up: Successful Patient Follow-Up  Culture assessed and recommendations reviewed by: []  Elenor Quinones, Pharm.D. []  Heide Guile, Pharm.D., BCPS []  Parks Neptune, Pharm.D. [x]  Alycia Rossetti, Pharm.D., BCPS []  [place], [place].D., BCPS, AAHIVP []  Legrand Como, Pharm.D., BCPS, AAHIVP []  Milus Glazier, Pharm.D. []  Stephens November, Pharm.D.  Positive urine culture  []  Patient discharged without antimicrobial prescription and treatment is now indicated [x]  Organism is resistant to prescribed ED discharge antimicrobial []  Patient with positive blood cultures  Changes discussed with ED provider: Gloriann Loan PA New antibiotic prescription Zyvox 600 mg po bid x 14 days. DC cipro Called to [facility] on [address]     Ileene Musa 07/08/2016, 10:18 AM

## 2016-07-17 ENCOUNTER — Encounter (HOSPITAL_COMMUNITY): Payer: Self-pay | Admitting: Emergency Medicine

## 2016-07-17 ENCOUNTER — Emergency Department (HOSPITAL_COMMUNITY)
Admission: EM | Admit: 2016-07-17 | Discharge: 2016-07-17 | Discharge status: Against Medical Advice | Payer: Medicaid Other | Attending: Emergency Medicine | Admitting: Emergency Medicine

## 2016-07-17 ENCOUNTER — Emergency Department (HOSPITAL_COMMUNITY)
Admission: EM | Admit: 2016-07-17 | Discharge: 2016-07-18 | Disposition: A | Discharge status: Home / Self Care | Payer: Medicaid Other | Source: Home / Self Care | Attending: Emergency Medicine | Admitting: Emergency Medicine

## 2016-07-17 DIAGNOSIS — B952 Enterococcus as the cause of diseases classified elsewhere: Secondary | ICD-10-CM

## 2016-07-17 DIAGNOSIS — I110 Hypertensive heart disease with heart failure: Secondary | ICD-10-CM

## 2016-07-17 DIAGNOSIS — R319 Hematuria, unspecified: Secondary | ICD-10-CM

## 2016-07-17 DIAGNOSIS — Z8673 Personal history of transient ischemic attack (TIA), and cerebral infarction without residual deficits: Secondary | ICD-10-CM | POA: Diagnosis not present

## 2016-07-17 DIAGNOSIS — N390 Urinary tract infection, site not specified: Secondary | ICD-10-CM | POA: Insufficient documentation

## 2016-07-17 DIAGNOSIS — F17210 Nicotine dependence, cigarettes, uncomplicated: Secondary | ICD-10-CM | POA: Insufficient documentation

## 2016-07-17 DIAGNOSIS — Z7982 Long term (current) use of aspirin: Secondary | ICD-10-CM

## 2016-07-17 DIAGNOSIS — N320 Bladder-neck obstruction: Secondary | ICD-10-CM | POA: Diagnosis not present

## 2016-07-17 DIAGNOSIS — E119 Type 2 diabetes mellitus without complications: Secondary | ICD-10-CM

## 2016-07-17 DIAGNOSIS — Z7984 Long term (current) use of oral hypoglycemic drugs: Secondary | ICD-10-CM | POA: Insufficient documentation

## 2016-07-17 DIAGNOSIS — D72829 Elevated white blood cell count, unspecified: Secondary | ICD-10-CM

## 2016-07-17 DIAGNOSIS — I5030 Unspecified diastolic (congestive) heart failure: Secondary | ICD-10-CM | POA: Insufficient documentation

## 2016-07-17 DIAGNOSIS — Z79899 Other long term (current) drug therapy: Secondary | ICD-10-CM | POA: Insufficient documentation

## 2016-07-17 LAB — CBC WITH DIFFERENTIAL/PLATELET
BASOS ABS: 0.1 10*3/uL (ref 0.0–0.1)
BASOS PCT: 1 %
Eosinophils Absolute: 0.4 10*3/uL (ref 0.0–0.7)
Eosinophils Relative: 5 %
HEMATOCRIT: 36.1 % — AB (ref 39.0–52.0)
HEMOGLOBIN: 11.3 g/dL — AB (ref 13.0–17.0)
LYMPHS PCT: 39 %
Lymphs Abs: 3.7 10*3/uL (ref 0.7–4.0)
MCH: 23.3 pg — ABNORMAL LOW (ref 26.0–34.0)
MCHC: 31.3 g/dL (ref 30.0–36.0)
MCV: 74.4 fL — AB (ref 78.0–100.0)
MONO ABS: 0.5 10*3/uL (ref 0.1–1.0)
Monocytes Relative: 5 %
NEUTROS ABS: 4.8 10*3/uL (ref 1.7–7.7)
NEUTROS PCT: 50 %
Platelets: 261 10*3/uL (ref 150–400)
RBC: 4.85 MIL/uL (ref 4.22–5.81)
RDW: 15.3 % (ref 11.5–15.5)
WBC: 9.4 10*3/uL (ref 4.0–10.5)

## 2016-07-17 LAB — URINALYSIS, ROUTINE W REFLEX MICROSCOPIC
Bilirubin Urine: NEGATIVE
GLUCOSE, UA: NEGATIVE mg/dL
Ketones, ur: NEGATIVE mg/dL
Nitrite: NEGATIVE
PH: 7 (ref 5.0–8.0)
Specific Gravity, Urine: 1.023 (ref 1.005–1.030)

## 2016-07-17 LAB — BASIC METABOLIC PANEL
ANION GAP: 6 (ref 5–15)
BUN: 15 mg/dL (ref 6–20)
CALCIUM: 9.1 mg/dL (ref 8.9–10.3)
CHLORIDE: 107 mmol/L (ref 101–111)
CO2: 25 mmol/L (ref 22–32)
Creatinine, Ser: 1.04 mg/dL (ref 0.61–1.24)
GFR calc non Af Amer: >60 mL/min (ref >60)
GLUCOSE: 103 mg/dL — AB (ref 65–99)
POTASSIUM: 3.6 mmol/L (ref 3.5–5.1)
Sodium: 138 mmol/L (ref 135–145)

## 2016-07-17 LAB — URINE MICROSCOPIC-ADD ON: Squamous Epithelial / LPF: NONE SEEN

## 2016-07-17 LAB — CBG MONITORING, ED: GLUCOSE-CAPILLARY: 142 mg/dL — AB (ref 65–99)

## 2016-07-17 MED ORDER — LIDOCAINE HCL 2 % EX GEL
1.0000 "application " | Freq: Once | CUTANEOUS | Status: DC
  Filled 2016-07-17: qty 20

## 2016-07-17 NOTE — ED Notes (Signed)
Pt presents from home with hematuria x 1 wk; pt states was seen last week for same and dx with UTI; pt reports he is no better today; pt reporting "lots of blood"; pt ambulatory from triage to room with steady gait noted

## 2016-07-17 NOTE — ED Notes (Signed)
Nurse tech and Nurse attempted foley. Catheter unable to advance and patient did not tolerate procedure well. Inserted foley 1.5inch. Bright red blood scant amount tip of penis. Notified provider. Ordered coude foley 59 F and provider will attempt.

## 2016-07-17 NOTE — ED Provider Notes (Signed)
CSN: [id number]     Arrival date & time 07/17/16  0536 History   First MD Initiated Contact with Patient 07/17/16 [phone]     Chief Complaint  Patient presents with  . Hematuria     (Consider location/radiation/quality/duration/timing/severity/associated sxs/prior Treatment) The history is provided by the patient and medical records. No language interpreter was used.     [name] is a 65 y.o. male  with a hx of HTN, NIDDM, CVA, CHF, etoh and tobacco dependence presents to the Emergency Department complaining of gradual, persistent, progressively worsening hematuria onset almost 2 weeks ago. Pt reports that last night they urine turned dark red and there were associated blood clots.  Associated symptoms include suprapubic pain radiating into his penis.  He reports nausea, vomiting, x2 this morning, urinary frequency.   Nothing makes the symptoms better or worse.  Pt reports he has been taking his medications as prescribed.  Pt denies fever, chills, headache, neck pain, chest pain, SOB, weakness, dizziness, syncope.  Pt reports he has an appointment with Urology on August 8th.    Record review shows pt was evaluated on 6/28 and 7/8 for UTI ssx.  Pt also had urinary retention of 1130mL on 7/8.  It was recommended that pt have a foley placed, but he declined.  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  He was to follow-up with Urology. Record review shows pt was d/c home on cipro which was changed to Zyvox on 7/11 after his culture grew Vancomycin resistant enterococcus.     Past Medical History  Diagnosis Date  . Hypertension   . Diabetes mellitus   . Brain tumor ([place])   . CVA (cerebral vascular accident) ([place])   . Narcotic abuse   . TIA (transient ischemic attack) 09/06/2012  . Vertigo 09/06/2012  . HTN (hypertension), malignant 09/06/2012  .  Tobacco use disorder 12/10/2013  . Alcohol dependence ([place]) 12/10/2013  . Angioedema of lips 12/24/2013  . Chronic headache   . Chronic back pain   . CHF (congestive heart failure) ([facility])     diastolic  . Normal cardiac stress test 2009   Past Surgical History  Procedure Laterality Date  . No past surgeries     Family History  Problem Relation Age of Onset  . CAD Father     "heart attack in his 35's"  . Hypertension Father   . Diabetes Brother   . Diabetes Sister   . Hypertension Mother    Social History  Substance Use Topics  . Smoking status: Current Every Day Smoker -- 0.50 packs/day    Types: Cigarettes  . Smokeless tobacco: Current User  . Alcohol Use: 0.0 oz/week    0 Standard drinks or equivalent per week     Comment: 6 pack a week    Review of Systems  Constitutional: Negative for fever, diaphoresis, appetite change, fatigue and unexpected weight change.  HENT: Negative for mouth sores.   Eyes: Negative for visual disturbance.  Respiratory: Negative for cough, chest tightness, shortness of breath and wheezing.   Cardiovascular: Negative for chest pain.  Gastrointestinal: Negative for nausea, vomiting, abdominal pain, diarrhea and constipation.  Endocrine: Negative for polydipsia, polyphagia and polyuria.  Genitourinary: Positive for hematuria. Negative for dysuria, urgency and frequency.  Musculoskeletal: Negative for back pain and neck stiffness.  Skin: Negative for rash.  Allergic/Immunologic:  Negative for immunocompromised state.  Neurological: Negative for syncope, light-headedness and headaches.  Hematological: Does not bruise/bleed easily.  Psychiatric/Behavioral: Negative for sleep disturbance. The patient is not nervous/anxious.       Allergies  Review of patient's allergies indicates no known allergies.  Home Medications   Prior to Admission medications   Medication Sig Start Date End Date Taking? Authorizing Provider  amLODipine (NORVASC) 10  MG tablet Take 10 mg by mouth daily.   Yes Historical Provider, MD  aspirin EC 81 MG tablet Take 81 mg by mouth daily.   Yes Historical Provider, MD  b complex vitamins tablet Take 1 tablet by mouth daily.   Yes Historical Provider, MD  cyclobenzaprine (FLEXERIL) 10 MG tablet Take 1 tablet (10 mg total) by mouth 3 (three) times daily as needed for muscle spasms. 05/21/16  Yes Christopher Lawyer, PA-C  gabapentin (NEURONTIN) 100 MG capsule Take 100 mg by mouth 3 (three) times daily. 06/10/16  Yes Historical Provider, MD  hydrALAZINE (APRESOLINE) 100 MG tablet Take 1 tablet (100 mg total) by mouth every 8 (eight) hours. 01/30/15  Yes Reyne Dumas, MD  HYDROcodone-acetaminophen (NORCO/VICODIN) 5-325 MG tablet Take 1 tablet by mouth every 6 (six) hours as needed for moderate pain. 05/21/16  Yes Christopher Lawyer, PA-C  ibuprofen (ADVIL,MOTRIN) 800 MG tablet Take 800 mg by mouth every 8 (eight) hours as needed. pain 06/10/16  Yes Historical Provider, MD  isosorbide dinitrate (ISORDIL) 10 MG tablet Take 1 tablet (10 mg total) by mouth 3 (three) times daily. 12/27/13  Yes Debbe Odea, MD  linezolid (ZYVOX) 600 MG tablet Take 600 mg by mouth 2 (two) times daily. For 14 days   Yes Historical Provider, MD  metFORMIN (GLUCOPHAGE) 500 MG tablet Take 500 mg by mouth 2 (two) times daily with a meal.   Yes Historical Provider, MD  naproxen (NAPROSYN) 375 MG tablet Take 1 tablet (375 mg total) by mouth 2 (two) times daily. 01/03/16  Yes Ankit Nanavati, MD  ondansetron (ZOFRAN) 4 MG tablet Take 1 tablet (4 mg total) by mouth every 6 (six) hours. 03/20/16  Yes Montine Circle, PA-C  oxyCODONE-acetaminophen (PERCOCET/ROXICET) 5-325 MG tablet Take 1 tablet by mouth every 8 (eight) hours as needed for moderate pain.  06/10/16  Yes Historical Provider, MD  pantoprazole (PROTONIX) 20 MG tablet Take 1 tablet (20 mg total) by mouth daily. 07/02/15  Yes [name], MD  pravastatin (PRAVACHOL) 20 MG tablet Take 20 mg by mouth at  bedtime.   Yes Historical Provider, MD  tamsulosin (FLOMAX) 0.4 MG CAPS capsule Take 1 capsule (0.4 mg total) by mouth daily. 07/05/16  Yes Jaime Pilcher Ward, PA-C   BP 175/57 mmHg  Pulse 52  Temp(Src) 97.7 F (36.5 C) (Rectal)  Resp 16  SpO2 98% Physical Exam  Constitutional: He appears well-developed and well-nourished. No distress.  Awake, alert, nontoxic appearance  HENT:  Head: Normocephalic and atraumatic.  Mouth/Throat: Oropharynx is clear and moist. No oropharyngeal exudate.  Eyes: Conjunctivae are normal. No scleral icterus.  Neck: Normal range of motion. Neck supple.  Cardiovascular: Normal rate, regular rhythm, normal heart sounds and intact distal pulses.   Pulmonary/Chest: Effort normal and breath sounds normal. No respiratory distress. He has no wheezes.  Equal chest expansion  Abdominal: Soft. Bowel sounds are normal. He exhibits no mass. There is tenderness in the right lower quadrant, suprapubic area and left lower quadrant. There is no rebound and no guarding.  Musculoskeletal: Normal range of motion. He exhibits no edema.  Neurological: He is alert.  Speech is clear and goal oriented Moves extremities without ataxia  Skin: Skin is warm and dry. He is not diaphoretic.  Psychiatric: He has a normal mood and affect.  Nursing note and vitals reviewed.   ED Course  BLADDER CATHETERIZATION Date/Time: 07/17/2016 11:33 AM Performed by: Abigail Butts Authorized by: Abigail Butts Consent: Verbal consent obtained. Risks and benefits: risks, benefits and alternatives were discussed Consent given by: patient Patient understanding: patient states understanding of the procedure being performed Patient consent: the patient's understanding of the procedure matches consent given Procedure consent: procedure consent matches procedure scheduled Relevant documents: relevant documents present and verified Site marked: the operative site was marked Imaging studies:  imaging studies available (CT on 7/10) Required items: required blood products, implants, devices, and special equipment available Patient identity confirmed: verbally with patient Time out: Immediately prior to procedure a "time out" was called to verify the correct patient, procedure, equipment, support staff and site/side marked as required. Indications: hematuria and urinary retention Local anesthesia used: yes Local anesthetic: lidocaine/prilocaine emulsion Anesthetic total: 3 ml Patient sedated: no Preparation: Patient was prepped and draped in the usual sterile fashion. Catheter type: coude Catheter size: 20 Fr Complicated insertion: yes Number of attempts: 2 Comments: Unable to pass the catheter  BLADDER CATHETERIZATION Date/Time: 07/17/2016 11:34 AM Performed by: Abigail Butts Authorized by: Abigail Butts Consent: Verbal consent obtained. Risks and benefits: risks, benefits and alternatives were discussed Consent given by: patient Patient understanding: patient states understanding of the procedure being performed Patient consent: the patient's understanding of the procedure matches consent given Procedure consent: procedure consent matches procedure scheduled Relevant documents: relevant documents present and verified Site marked: the operative site was marked Required items: required blood products, implants, devices, and special equipment available Patient identity confirmed: verbally with patient Time out: Immediately prior to procedure a "time out" was called to verify the correct patient, procedure, equipment, support staff and site/side marked as required. Indications: hematuria and urinary retention Patient sedated: no Preparation: Patient was prepped and draped in the usual sterile fashion. Catheter type: coude Catheter size: 18 Fr Complicated insertion: yes Number of attempts: 2 Comments: Unable to pass the catheter   (including critical care  time) Labs Review Labs Reviewed  URINALYSIS, ROUTINE W REFLEX MICROSCOPIC (NOT AT [facility]) - Abnormal; Notable for the following:    APPearance CLOUDY (*)    Hgb urine dipstick LARGE (*)    Protein, ur >300 (*)    Leukocytes, UA MODERATE (*)    All other components within normal limits  CBC WITH DIFFERENTIAL/PLATELET - Abnormal; Notable for the following:    Hemoglobin 11.3 (*)    HCT 36.1 (*)    MCV 74.4 (*)    MCH 23.3 (*)    All other components within normal limits  BASIC METABOLIC PANEL - Abnormal; Notable for the following:    Glucose, Bld 103 (*)    All other components within normal limits  URINE MICROSCOPIC-ADD ON - Abnormal; Notable for the following:    Bacteria, UA MANY (*)    All other components within normal limits  URINE CULTURE    MDM   Final diagnoses:  Enterococcus UTI  Leukocytosis  Bladder outlet obstruction   [name] presents with worsening hematuria 9 days after switch to Zyvox for Vancomycin-resistant enterococcus. Bladder scan today with almost 400 mL of urine. Patient was able to produce approximately 50 mL of grossly bloody urine on his own. Urinalysis shows large amount  of hemoglobin and moderate leukocytes with too numerous to count white blood cells, many bacteria and a field obscured by RBCs.  Leukocytosis has resolved. Mild anemia now 11.3. Labs otherwise reassuring. Normal BUN and creatinine.  8:51 AM Discussed with Dr. Karsten Ro who recommends indwelling 83F foley with irrigation.  Pt is to continue Zyvox and keep his f/u appointment with urology.  Discussed this with the patient who was initially resistant to a foley catheter.  I have however explained that his urinary retention is causing a persistent infection in spite of previous treatments.  Pt is agreeable to foley at this time.    1:41 PM Discussed with Dr. Karsten Ro who will send someone to insert foley catheter.    2:31 PM Pt evaluated by Randolm Idol.  Pt refuses for her to attempt  the foley catheter.  Pt is wishing for Dr. Karsten Ro to perform a cystoscopy in the ED.  He will evaluate after clinic.   3:15 PM At shift change care transferred to Tanna Furry, MD who will follow and dispo according to Dr. Karsten Ro.     3:40 PM Pt eloped from the Emergency Department before he was evaluated by Dr. Karsten Ro.    Jarrett Soho Adamarys Shall, PA-C 07/17/16 1541  Leo Grosser, MD 07/17/16 1900

## 2016-07-17 NOTE — ED Provider Notes (Signed)
CSN: [id number]     Arrival date & time 07/17/16  1906 History   First MD Initiated Contact with Patient 07/17/16 2348     Chief Complaint  Patient presents with  . Urinary Tract Infection   HPI  Mr. Tallman is a 65 year old male with PMHx of HTN, DM, CVA, CHF, alcohol abuse presenting with hematuria. Pt was seen earlier today with full work up but eloped prior to urology evaluation. Pt states he has had worsening hematuria over the past few weeks with large clots that began to pass. Endorses associated suprapubic pain, nausea and vomiting. States he is having more frequent urination with frank blood. He states he left the ED earlier because "they wouldn't give me anything for my pain even though they stuck me 4 times". He denies change in symptoms currently. States he went to local pharmacy for motrin and now that he has taken some he is ready for urology evaluation.  Chart review: pt seen for UTI and retention previously. Discharged most recently on cipro with urology follow up. Culture grew vancomycin resistant enterococcus and pt was switched to Zyvox. Pt evaluated in ED earlier today. Multiple attempts by EDP at catheter placement. Urology PA at bedside and pt refusing catheter placement at that time. Dr. Karsten Ro called who was planning to evaluated pt in ED but pt eloped prior to this.   Past Medical History  Diagnosis Date  . Hypertension   . Diabetes mellitus   . Brain tumor ([place])   . CVA (cerebral vascular accident) ([place])   . Narcotic abuse   . TIA (transient ischemic attack) 09/06/2012  . Vertigo 09/06/2012  . HTN (hypertension), malignant 09/06/2012  . Tobacco use disorder 12/10/2013  . Alcohol dependence ([place]) 12/10/2013  . Angioedema of lips 12/24/2013  . Chronic headache   . Chronic back pain   . CHF (congestive heart failure) ([facility])     diastolic  . Normal cardiac stress test 2009   Past Surgical History  Procedure Laterality Date  . No past surgeries     Family History   Problem Relation Age of Onset  . CAD Father     "heart attack in his 68's"  . Hypertension Father   . Diabetes Brother   . Diabetes Sister   . Hypertension Mother    Social History  Substance Use Topics  . Smoking status: Current Every Day Smoker -- 0.00 packs/day    Types: Cigarettes  . Smokeless tobacco: Current User  . Alcohol Use: Yes    Review of Systems  All other systems reviewed and are negative.     Allergies  Review of patient's allergies indicates no known allergies.  Home Medications   Prior to Admission medications   Medication Sig Start Date End Date Taking? Authorizing Provider  amLODipine (NORVASC) 10 MG tablet Take 10 mg by mouth daily.    Historical Provider, MD  aspirin EC 81 MG tablet Take 81 mg by mouth daily.    Historical Provider, MD  b complex vitamins tablet Take 1 tablet by mouth daily.    Historical Provider, MD  cyclobenzaprine (FLEXERIL) 10 MG tablet Take 1 tablet (10 mg total) by mouth 3 (three) times daily as needed for muscle spasms. 05/21/16   Dalia Heading, PA-C  gabapentin (NEURONTIN) 100 MG capsule Take 100 mg by mouth 3 (three) times daily. 06/10/16   Historical Provider, MD  hydrALAZINE (APRESOLINE) 100 MG tablet Take 1 tablet (100 mg total) by mouth every 8 (eight)  hours. 01/30/15   Reyne Dumas, MD  HYDROcodone-acetaminophen (NORCO/VICODIN) 5-325 MG tablet Take 1 tablet by mouth every 6 (six) hours as needed for moderate pain. 05/21/16   Dalia Heading, PA-C  ibuprofen (ADVIL,MOTRIN) 800 MG tablet Take 800 mg by mouth every 8 (eight) hours as needed. pain 06/10/16   Historical Provider, MD  isosorbide dinitrate (ISORDIL) 10 MG tablet Take 1 tablet (10 mg total) by mouth 3 (three) times daily. 12/27/13   Debbe Odea, MD  linezolid (ZYVOX) 600 MG tablet Take 600 mg by mouth 2 (two) times daily. For 14 days    Historical Provider, MD  metFORMIN (GLUCOPHAGE) 500 MG tablet Take 500 mg by mouth 2 (two) times daily with a meal.     Historical Provider, MD  naproxen (NAPROSYN) 375 MG tablet Take 1 tablet (375 mg total) by mouth 2 (two) times daily. 01/03/16   [name], MD  ondansetron (ZOFRAN) 4 MG tablet Take 1 tablet (4 mg total) by mouth every 6 (six) hours. 03/20/16   Montine Circle, PA-C  oxyCODONE-acetaminophen (PERCOCET/ROXICET) 5-325 MG tablet Take 1 tablet by mouth every 8 (eight) hours as needed for moderate pain.  06/10/16   Historical Provider, MD  pantoprazole (PROTONIX) 20 MG tablet Take 1 tablet (20 mg total) by mouth daily. 07/02/15   [name], MD  pravastatin (PRAVACHOL) 20 MG tablet Take 20 mg by mouth at bedtime.    Historical Provider, MD  tamsulosin (FLOMAX) 0.4 MG CAPS capsule Take 1 capsule (0.4 mg total) by mouth daily. 07/05/16   Ozella Almond Ward, PA-C  tamsulosin (FLOMAX) 0.4 MG CAPS capsule Take 1 capsule (0.4 mg total) by mouth daily. 07/18/16   Cage Gupton, PA-C   BP 149/101 mmHg  Pulse 77  Temp(Src) 98.2 F (36.8 C) (Oral)  Resp 16  SpO2 100% Physical Exam  Constitutional: He is oriented to person, place, and time. He appears well-developed and well-nourished. No distress.  Nontoxic appearing. NAD.   HENT:  Head: Normocephalic and atraumatic.  Eyes: Conjunctivae are normal. Right eye exhibits no discharge. Left eye exhibits no discharge. No scleral icterus.  Neck: Normal range of motion.  Cardiovascular: Normal rate, regular rhythm and normal heart sounds.   Pulmonary/Chest: Effort normal and breath sounds normal. No respiratory distress.  Abdominal: Soft. He exhibits no distension. There is tenderness. There is no rebound and no guarding.  Generalized TTP over the lower abdomen. No guarding or rebound.   Musculoskeletal: Normal range of motion.  Neurological: He is alert and oriented to person, place, and time. Coordination normal.  Skin: Skin is warm and dry.  Psychiatric: He has a normal mood and affect. His behavior is normal.  Nursing note and vitals reviewed.   ED  Course  Procedures (including critical care time) Labs Review Labs Reviewed  CBG MONITORING, ED - Abnormal; Notable for the following:    Glucose-Capillary 142 (*)    All other components within normal limits  URINALYSIS, ROUTINE W REFLEX MICROSCOPIC (NOT AT [facility])    Imaging Review No results found. I have personally reviewed and evaluated these images and lab results as part of my medical decision-making.   EKG Interpretation None      MDM   Final diagnoses:  Hematuria  UTI (lower urinary tract infection)   65 year old male presenting with hematuria x 2 weeks and known UTI with VRE. Evaluated in ER earlier for this with multiple failed foley placements. Pt eloped prior to formal evaluation by urology. Pt returns after  getting motrin from outside pharmacy. Repeat bladder scan shows ~700 cc urine.  Labs show Hgb at 11.3. Normal BUN and creatinine. No repeat lab draw indicated.  1:00 AM - Consulted Dr. Karsten Ro who is familiar with the patient. Dr. Karsten Ro states pt can follow up in his office first thing in the morning as cystoscopy and foley is not emergent and to give pt dose of flomax now.  Pt discharged with instruction to call Dr. Simone Curia office and he will be seen. Discussed with Dr. Christy Gentles who agrees with this plan. Discussed return precautions and pt states understanding. Pt stable for discharge.     Maram Bently, PA-C 07/18/16 0100  Ripley Fraise, MD 07/18/16 [phone]

## 2016-07-17 NOTE — ED Notes (Signed)
Urologist to beside, unable to locate the patient, pt belongings are missing from bedside.

## 2016-07-17 NOTE — ED Notes (Signed)
Pt requested his sugar checked. CBG 142.

## 2016-07-17 NOTE — ED Notes (Signed)
Urology at bedside.

## 2016-07-17 NOTE — ED Notes (Signed)
Urinal placed at bedside; pt instructed to provide urine sample

## 2016-07-17 NOTE — ED Notes (Signed)
Pt walked out of ED before pt was seen by urology.

## 2016-07-17 NOTE — ED Notes (Signed)
Pt. reports dysuria , bladder pressure and hematuria today seen here this morning but left AMA , denies fever or chills.

## 2016-07-17 NOTE — ED Notes (Signed)
>   716ml urine in bladder per bladder scanner

## 2016-07-17 NOTE — Consult Note (Signed)
Urology Consult   Physician requesting consult: Laneta Simmers  Reason for consult: Urinary retention and gross hematuria  History of Present Illness: [name] is a 65 y.o. male with PMH significant for HTN, DM II, CVA, ETOH and tobacco dependence, and CHF who presented to the ED with c/o gross hematuria.  Pt states he first noted blood in his urine approx 3 weeks ago and that it had been light pink until this morning when it became bright red with clots. He denies all voiding/storage sx, F/C, N/V, and back/flank pain.  He was treated in the ED on 6/28 and 7/8 for UTIs.  The UTI on 6/28 was treated with Keflex.  The culture from 7/8 showed VRE, therefore, he was given a 14 day course of Zyvox which he is currently still taking.  CT scan on 7/8 showed diffuse bladder wall thickening, a large right bladder diverticulum, a smaller left bladder diverticulum, and no stones or hydro. Dr. Jeffie Pollock was consulted on the 7/8 visit.  He advised Abx, flomax, and foley placement with outpt f/u at our office. Pt refused foley placement and was d/c'd home.  He has a pending urology appt in our office for 08/05/16.    UA in the ED is positive for bacteria, WBCs, and TNTC RBCs.  H/H, Bun/Cr are WNL.  PVR 400cc. Dr. Karsten Ro was called regarding the pt and he gave instructions for the pt to continue his current ABx and for a foley to be placed with irrigation after which the pt could be d/c'd home with urology f/u appt on 08/05/16.  Foley placement was attempted 4 times with both straight and coude caths all of which were unsuccessful. Urology was contacted again for foley placement. After discussion with the pt he refused foley placement due to penile pain from previous attempts.   He denies a history of voiding or storage urinary symptoms, urolithiasis, and GU malignancy/trauma/surgery.  He states he has been treated for at least 6 UTIs over the past year but has not been evaled by a urologist.  He has a PCP that has checked his  prostate and he has been told it is "normal".  Review of CTs show that bladder wall thickening and diverticulum have been present since 2014.    Past Medical History  Diagnosis Date  . Hypertension   . Diabetes mellitus   . Brain tumor ([place])   . CVA (cerebral vascular accident) ([place])   . Narcotic abuse   . TIA (transient ischemic attack) 09/06/2012  . Vertigo 09/06/2012  . HTN (hypertension), malignant 09/06/2012  . Tobacco use disorder 12/10/2013  . Alcohol dependence ([place]) 12/10/2013  . Angioedema of lips 12/24/2013  . Chronic headache   . Chronic back pain   . CHF (congestive heart failure) (HCC)     diastolic  . Normal cardiac stress test 2009    Past Surgical History  Procedure Laterality Date  . No past surgeries      Current Hospital Medications:  Home Meds:    Medication List    ASK your doctor about these medications        amLODipine 10 MG tablet  Commonly known as:  NORVASC  Take 10 mg by mouth daily.     aspirin EC 81 MG tablet  Take 81 mg by mouth daily.     b complex vitamins tablet  Take 1 tablet by mouth daily.     cyclobenzaprine 10 MG tablet  Commonly known as:  FLEXERIL  Take  1 tablet (10 mg total) by mouth 3 (three) times daily as needed for muscle spasms.     gabapentin 100 MG capsule  Commonly known as:  NEURONTIN  Take 100 mg by mouth 3 (three) times daily.     hydrALAZINE 100 MG tablet  Commonly known as:  APRESOLINE  Take 1 tablet (100 mg total) by mouth every 8 (eight) hours.     HYDROcodone-acetaminophen 5-325 MG tablet  Commonly known as:  NORCO/VICODIN  Take 1 tablet by mouth every 6 (six) hours as needed for moderate pain.     ibuprofen 800 MG tablet  Commonly known as:  ADVIL,MOTRIN  Take 800 mg by mouth every 8 (eight) hours as needed. pain     isosorbide dinitrate 10 MG tablet  Commonly known as:  ISORDIL  Take 1 tablet (10 mg total) by mouth 3 (three) times daily.     linezolid 600 MG tablet  Commonly known as:  ZYVOX   Take 600 mg by mouth 2 (two) times daily. For 14 days     metFORMIN 500 MG tablet  Commonly known as:  GLUCOPHAGE  Take 500 mg by mouth 2 (two) times daily with a meal.     naproxen 375 MG tablet  Commonly known as:  NAPROSYN  Take 1 tablet (375 mg total) by mouth 2 (two) times daily.     ondansetron 4 MG tablet  Commonly known as:  ZOFRAN  Take 1 tablet (4 mg total) by mouth every 6 (six) hours.     oxyCODONE-acetaminophen 5-325 MG tablet  Commonly known as:  PERCOCET/ROXICET  Take 1 tablet by mouth every 8 (eight) hours as needed for moderate pain.     pantoprazole 20 MG tablet  Commonly known as:  PROTONIX  Take 1 tablet (20 mg total) by mouth daily.     pravastatin 20 MG tablet  Commonly known as:  PRAVACHOL  Take 20 mg by mouth at bedtime.     tamsulosin 0.4 MG Caps capsule  Commonly known as:  FLOMAX  Take 1 capsule (0.4 mg total) by mouth daily.        Scheduled Meds: . lidocaine  1 application Topical Once   Continuous Infusions:  PRN Meds:.  Allergies: No Known Allergies  Family History  Problem Relation Age of Onset  . CAD Father     "heart attack in his 28's"  . Hypertension Father   . Diabetes Brother   . Diabetes Sister   . Hypertension Mother     Social History:  reports that he has been smoking Cigarettes.  He has been smoking about 0.50 packs per day. He uses smokeless tobacco. He reports that he drinks alcohol. He reports that he uses illicit drugs (Marijuana, Cocaine, and IV).  ROS: A complete review of systems was performed.  All systems are negative except for pertinent findings as noted.  Physical Exam:  Vital signs in last 24 hours: Temp:  [97.7 F (36.5 C)-98 F (36.7 C)] 97.7 F (36.5 C) (07/20 0644) Pulse Rate:  [54-79] 60 (07/20 1400) Resp:  [16-20] 16 (07/20 0900) BP: (160-183)/(59-88) 170/80 mmHg (07/20 1400) SpO2:  [95 %-100 %] 99 % (07/20 1400) Constitutional:  Alert and oriented, No acute distress Cardiovascular:  Regular rate and rhythm Respiratory: Normal respiratory effort GI: Abdomen is soft, nontender, nondistended GU: prostate exam deferred Lymphatic: No lymphadenopathy Neurologic: Grossly intact, no focal deficits Psychiatric: Normal mood and affect  Laboratory Data:   Recent Labs  07/17/16 0640  WBC 9.4  HGB 11.3*  HCT 36.1*  PLT 261     Recent Labs  07/17/16 0640  NA 138  K 3.6  CL 107  GLUCOSE 103*  BUN 15  CALCIUM 9.1  CREATININE 1.04     Results for orders placed or performed during the hospital encounter of 07/17/16 (from the past 24 hour(s))  CBC with Differential     Status: Abnormal   Collection Time: 07/17/16  6:40 AM  Result Value Ref Range   WBC 9.4 4.0 - 10.5 K/uL   RBC 4.85 4.22 - 5.81 MIL/uL   Hemoglobin 11.3 (L) 13.0 - 17.0 g/dL   HCT 36.1 (L) 39.0 - 52.0 %   MCV 74.4 (L) 78.0 - 100.0 fL   MCH 23.3 (L) 26.0 - 34.0 pg   MCHC 31.3 30.0 - 36.0 g/dL   RDW 15.3 11.5 - 15.5 %   Platelets 261 150 - 400 K/uL   Neutrophils Relative % 50 %   Neutro Abs 4.8 1.7 - 7.7 K/uL   Lymphocytes Relative 39 %   Lymphs Abs 3.7 0.7 - 4.0 K/uL   Monocytes Relative 5 %   Monocytes Absolute 0.5 0.1 - 1.0 K/uL   Eosinophils Relative 5 %   Eosinophils Absolute 0.4 0.0 - 0.7 K/uL   Basophils Relative 1 %   Basophils Absolute 0.1 0.0 - 0.1 K/uL  Basic metabolic panel     Status: Abnormal   Collection Time: 07/17/16  6:40 AM  Result Value Ref Range   Sodium 138 135 - 145 mmol/L   Potassium 3.6 3.5 - 5.1 mmol/L   Chloride 107 101 - 111 mmol/L   CO2 25 22 - 32 mmol/L   Glucose, Bld 103 (H) 65 - 99 mg/dL   BUN 15 6 - 20 mg/dL   Creatinine, Ser 1.04 0.61 - 1.24 mg/dL   Calcium 9.1 8.9 - 10.3 mg/dL   GFR calc non Af Amer >60 >60 mL/min   GFR calc Af Amer >60 >60 mL/min   Anion gap 6 5 - 15  Urinalysis, Routine w reflex microscopic- may I&O cath if menses     Status: Abnormal   Collection Time: 07/17/16  7:00 AM  Result Value Ref Range   Color, Urine YELLOW YELLOW    APPearance CLOUDY (A) CLEAR   Specific Gravity, Urine 1.023 1.005 - 1.030   pH 7.0 5.0 - 8.0   Glucose, UA NEGATIVE NEGATIVE mg/dL   Hgb urine dipstick LARGE (A) NEGATIVE   Bilirubin Urine NEGATIVE NEGATIVE   Ketones, ur NEGATIVE NEGATIVE mg/dL   Protein, ur >300 (A) NEGATIVE mg/dL   Nitrite NEGATIVE NEGATIVE   Leukocytes, UA MODERATE (A) NEGATIVE  Urine microscopic-add on     Status: Abnormal   Collection Time: 07/17/16  7:00 AM  Result Value Ref Range   Squamous Epithelial / LPF NONE SEEN NONE SEEN   WBC, UA TOO NUMEROUS TO COUNT 0 - 5 WBC/hpf   RBC / HPF FIELD OBSCURED BY RBC'S 0 - 5 RBC/hpf   Bacteria, UA MANY (A) NONE SEEN   No results found for this or any previous visit (from the past 240 hour(s)).  Renal Function:  Recent Labs  07/17/16 0640  CREATININE 1.04   Estimated Creatinine Clearance: 72.7 mL/min (by C-G formula based on Cr of 1.04).  Radiologic Imaging: EXAM: CT ABDOMEN AND PELVIS WITH CONTRAST  TECHNIQUE: Multidetector CT imaging of the abdomen and pelvis was performed using the standard protocol following bolus administration of  intravenous contrast.  CONTRAST: 100 cc Isovue-300  COMPARISON: 03/20/2016  FINDINGS: Dependent atelectasis towards the lung bases.  Cystic lesions in the left lobe of the liver are stable.  Gallbladder, spleen, pancreas, and adrenal glands are within normal limits  Unremarkable appearance of the kidneys. No hydronephrosis.  No evidence of bowel obstruction. No focal mass in the colon. Diverticulosis of the descending and sigmoid colon. Normal appendix.  There continues to be abnormal appearance of the bladder. There is a large diverticulum extending into the right lower quadrant. There is also a smaller left pelvic bladder diverticulum. Diffuse wall thickening of the bladder is present.  No free-fluid. No abnormal retroperitoneal adenopathy.  No vertebral compression deformity. Degenerative disc  disease with disc bulge at L3-4.  Atherosclerotic calcifications of the aorta and iliac vasculature.  IMPRESSION: There continues to be significant bladder wall thickening and bladder diverticuli. An inflammatory process is not excluded. Correlate with urinalysis.  Aortic atherosclerosis.  Chronic changes.   Electronically Signed  By: Marybelle Killings M.D.  On: 07/05/2016 08:06    Impression/Recommendation  UTI--continue Zyvox.  Collect sterile specimen to be sent for culture as specimen sent this morning was taken from pt urinal.  He has possibly had multiple UTIs over the past year due to BOO, and diverticulum. As he is a diabetic he may also have some degree of neurogenic bladder and may require urodynamics.   Hematuria--likely due to infection, however, pt is a smoker and cysto will need to be performed to eval for bladder mass.  Non contrasted CT did not show renal or bladder masses.  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  He had 400cc on PVR earlier in the day.  Pt refused additional attempt at foley placement.  Dr. Karsten Ro will perform bedside cysto with foley placement.  Pt will need prostate exam and PSA (if this has not been recently obtained by his PCP) after his infection has cleared.   Amado Nash, [place] 07/17/2016, 2:37 PM   I came to see the patient at 3:30 and found the patient had just put on his clothes and walked out at the emergency room after having been offered an attempt at Foley catheter placement by our nurse practitioner but refusing.

## 2016-07-18 LAB — URINE CULTURE: Culture: NO GROWTH

## 2016-07-18 LAB — URINALYSIS, ROUTINE W REFLEX MICROSCOPIC
Bilirubin Urine: NEGATIVE
Glucose, UA: NEGATIVE mg/dL
Ketones, ur: NEGATIVE mg/dL
Nitrite: NEGATIVE
Specific Gravity, Urine: 1.015 (ref 1.005–1.030)
pH: 7 (ref 5.0–8.0)

## 2016-07-18 LAB — URINE MICROSCOPIC-ADD ON

## 2016-07-18 MED ORDER — TAMSULOSIN HCL 0.4 MG PO CAPS
0.4000 mg | ORAL_CAPSULE | Freq: Once | ORAL | Status: AC
  Administered 2016-07-18: 0.4 mg via ORAL
  Filled 2016-07-18: qty 1

## 2016-07-18 MED ORDER — TAMSULOSIN HCL 0.4 MG PO CAPS: 0.4000 mg | ORAL_CAPSULE | Freq: Every day | ORAL | Status: DC

## 2016-07-18 NOTE — ED Notes (Signed)
Patient verbalized understanding of discharge instructions and denies any further needs or questions at this time. VS stable. Patient ambulatory with steady gait.  

## 2016-07-22 ENCOUNTER — Encounter (HOSPITAL_COMMUNITY): Payer: Self-pay | Admitting: Emergency Medicine

## 2016-07-22 DIAGNOSIS — I110 Hypertensive heart disease with heart failure: Secondary | ICD-10-CM | POA: Insufficient documentation

## 2016-07-22 DIAGNOSIS — R1084 Generalized abdominal pain: Secondary | ICD-10-CM | POA: Insufficient documentation

## 2016-07-22 DIAGNOSIS — I5032 Chronic diastolic (congestive) heart failure: Secondary | ICD-10-CM | POA: Diagnosis not present

## 2016-07-22 DIAGNOSIS — Z7984 Long term (current) use of oral hypoglycemic drugs: Secondary | ICD-10-CM | POA: Diagnosis not present

## 2016-07-22 DIAGNOSIS — Z8673 Personal history of transient ischemic attack (TIA), and cerebral infarction without residual deficits: Secondary | ICD-10-CM | POA: Insufficient documentation

## 2016-07-22 DIAGNOSIS — Z7982 Long term (current) use of aspirin: Secondary | ICD-10-CM | POA: Diagnosis not present

## 2016-07-22 DIAGNOSIS — E119 Type 2 diabetes mellitus without complications: Secondary | ICD-10-CM | POA: Diagnosis not present

## 2016-07-22 DIAGNOSIS — Z79899 Other long term (current) drug therapy: Secondary | ICD-10-CM | POA: Insufficient documentation

## 2016-07-22 DIAGNOSIS — F17210 Nicotine dependence, cigarettes, uncomplicated: Secondary | ICD-10-CM | POA: Insufficient documentation

## 2016-07-22 LAB — COMPREHENSIVE METABOLIC PANEL
ALBUMIN: 3.7 g/dL (ref 3.5–5.0)
ALK PHOS: 48 U/L (ref 38–126)
ALT: 34 U/L (ref 17–63)
AST: 32 U/L (ref 15–41)
Anion gap: 7 (ref 5–15)
BILIRUBIN TOTAL: 0.7 mg/dL (ref 0.3–1.2)
BUN: 10 mg/dL (ref 6–20)
CALCIUM: 9.5 mg/dL (ref 8.9–10.3)
CO2: 23 mmol/L (ref 22–32)
Chloride: 109 mmol/L (ref 101–111)
Creatinine, Ser: 1.07 mg/dL (ref 0.61–1.24)
GFR calc Af Amer: >60 mL/min (ref >60)
GFR calc non Af Amer: >60 mL/min (ref >60)
GLUCOSE: 122 mg/dL — AB (ref 65–99)
POTASSIUM: 3.8 mmol/L (ref 3.5–5.1)
Sodium: 139 mmol/L (ref 135–145)
TOTAL PROTEIN: 6.7 g/dL (ref 6.5–8.1)

## 2016-07-22 LAB — CBC
HEMATOCRIT: 35 % — AB (ref 39.0–52.0)
Hemoglobin: 11.2 g/dL — ABNORMAL LOW (ref 13.0–17.0)
MCH: 23.6 pg — ABNORMAL LOW (ref 26.0–34.0)
MCHC: 32 g/dL (ref 30.0–36.0)
MCV: 73.7 fL — ABNORMAL LOW (ref 78.0–100.0)
Platelets: 230 10*3/uL (ref 150–400)
RBC: 4.75 MIL/uL (ref 4.22–5.81)
RDW: 14.7 % (ref 11.5–15.5)
WBC: 9.2 10*3/uL (ref 4.0–10.5)

## 2016-07-22 LAB — LIPASE, BLOOD: Lipase: 22 U/L (ref 11–51)

## 2016-07-22 NOTE — ED Triage Notes (Signed)
Patient reports generalized abdominal pain this evening with nausea , denies emesis or diarrhea , currently taking oral antibiotic for UTI . Denies fever or chills.

## 2016-07-23 ENCOUNTER — Encounter (HOSPITAL_COMMUNITY): Payer: Self-pay | Admitting: Emergency Medicine

## 2016-07-23 ENCOUNTER — Emergency Department (HOSPITAL_COMMUNITY)
Admission: EM | Admit: 2016-07-23 | Discharge: 2016-07-23 | Discharge status: Against Medical Advice | Payer: Medicaid Other | Source: Home / Self Care | Attending: Emergency Medicine | Admitting: Emergency Medicine

## 2016-07-23 ENCOUNTER — Emergency Department (HOSPITAL_COMMUNITY)
Admission: EM | Admit: 2016-07-23 | Discharge: 2016-07-23 | Disposition: A | Discharge status: Home / Self Care | Payer: Medicaid Other | Attending: Emergency Medicine | Admitting: Emergency Medicine

## 2016-07-23 DIAGNOSIS — Z79899 Other long term (current) drug therapy: Secondary | ICD-10-CM | POA: Insufficient documentation

## 2016-07-23 DIAGNOSIS — E119 Type 2 diabetes mellitus without complications: Secondary | ICD-10-CM

## 2016-07-23 DIAGNOSIS — F17210 Nicotine dependence, cigarettes, uncomplicated: Secondary | ICD-10-CM

## 2016-07-23 DIAGNOSIS — I5032 Chronic diastolic (congestive) heart failure: Secondary | ICD-10-CM | POA: Insufficient documentation

## 2016-07-23 DIAGNOSIS — R1084 Generalized abdominal pain: Secondary | ICD-10-CM | POA: Insufficient documentation

## 2016-07-23 DIAGNOSIS — I110 Hypertensive heart disease with heart failure: Secondary | ICD-10-CM

## 2016-07-23 DIAGNOSIS — Z7984 Long term (current) use of oral hypoglycemic drugs: Secondary | ICD-10-CM

## 2016-07-23 DIAGNOSIS — Z8673 Personal history of transient ischemic attack (TIA), and cerebral infarction without residual deficits: Secondary | ICD-10-CM | POA: Insufficient documentation

## 2016-07-23 DIAGNOSIS — Z7982 Long term (current) use of aspirin: Secondary | ICD-10-CM

## 2016-07-23 LAB — ETHANOL: Alcohol, Ethyl (B): <5 mg/dL

## 2016-07-23 MED ORDER — FAMOTIDINE 20 MG PO TABS
20.0000 mg | ORAL_TABLET | Freq: Once | ORAL | Status: AC
  Administered 2016-07-23: 20 mg via ORAL
  Filled 2016-07-23: qty 1

## 2016-07-23 MED ORDER — ONDANSETRON 4 MG PO TBDP
4.0000 mg | ORAL_TABLET | Freq: Once | ORAL | Status: AC
  Administered 2016-07-23: 4 mg via ORAL
  Filled 2016-07-23: qty 1

## 2016-07-23 MED ORDER — DICYCLOMINE HCL 10 MG PO CAPS
20.0000 mg | ORAL_CAPSULE | Freq: Once | ORAL | Status: AC
  Administered 2016-07-23: 20 mg via ORAL
  Filled 2016-07-23: qty 2

## 2016-07-23 MED ORDER — GI COCKTAIL ~~LOC~~
30.0000 mL | Freq: Once | ORAL | Status: AC
  Administered 2016-07-23: 30 mL via ORAL
  Filled 2016-07-23: qty 30

## 2016-07-23 MED ORDER — RANITIDINE HCL 150 MG/10ML PO SYRP
300.0000 mg | ORAL_SOLUTION | Freq: Once | ORAL | Status: AC
  Administered 2016-07-23: 300 mg via ORAL
  Filled 2016-07-23: qty 20

## 2016-07-23 NOTE — ED Notes (Signed)
Pt. Given urinal and instructed to give urine sample.

## 2016-07-23 NOTE — ED Notes (Signed)
Pt. Had another episode of vomiting green/bile colored emesis.

## 2016-07-23 NOTE — Discharge Instructions (Signed)
You have elected to leave the emergency department [facility]. He may return any time if he would like to continue the workup. Please follow-up with your doctor for reevaluation. Follow-up with your urologist on Friday for regularly scheduled appointment.

## 2016-07-23 NOTE — ED Provider Notes (Signed)
[name] DEPT Provider Note   CSN: [id number] Arrival date & time: 07/23/16  [id number]  First Provider Contact:  First MD Initiated Contact with Patient 07/23/16 0820        History   Chief Complaint Chief Complaint  Patient presents with  . Abdominal Pain    HPI [name] is a 65 y.o. male ith history of chronic abdominal pain, alcohol abuse, here for evaluation of acute abdominal pain. Patient reports abdominal pain is "the same as before". Discomfort started one day ago. Nothing tried to improve his symptoms. Denies any fevers, chills, urinary symptoms, diarrhea, constipation, back pain. He had one episode of emesis as he was being wheeled back to his room.  States he is currently taking antibiotics for a UTI, has follow-up appointment with his urologist on Friday. Was seen in this ED earlier this morning for same symptoms, negative workup,DC'd home. States he is still having pain and would like relief.  HPI  Past Medical History:  Diagnosis Date  . Alcohol dependence ([place]) 12/10/2013  . Angioedema of lips 12/24/2013  . Brain tumor ([place])   . CHF (congestive heart failure) ([facility])    diastolic  . Chronic back pain   . Chronic headache   . CVA (cerebral vascular accident) ([place])   . Diabetes mellitus   . HTN (hypertension), malignant 09/06/2012  . Hypertension   . Narcotic abuse   . Normal cardiac stress test 2009  . TIA (transient ischemic attack) 09/06/2012  . Tobacco use disorder 12/10/2013  . Vertigo 09/06/2012    Patient Active Problem List   Diagnosis Date Noted  . Chronic diastolic heart failure ([place]) 03/20/2015  . Chest pain 03/18/2015  . History of TIA (transient ischemic attack) 01/27/2015  . Enterococcus UTI 01/27/2015  . Cough 01/27/2015  . Bronchitis 01/27/2015  . HCAP (healthcare-associated pneumonia)   . CHF (congestive heart failure) ([place]) 01/26/2015  . History of alcohol abuse 01/26/2015  . Pyelonephritis 01/12/2015  . Prostatitis 01/12/2015  .  Essential hypertension 11/03/2014  . Hypokalemia 11/03/2014  . Chronic pain 12/24/2013  . Tobacco use disorder 12/10/2013  . Leukocytosis 12/10/2013  . Diabetes mellitus type 2, controlled ([place]) 09/06/2012  . Pain in the side 09/06/2012  . Pituitary adenoma ([place]) 09/06/2012    Past Surgical History:  Procedure Laterality Date  . NO PAST SURGERIES         Home Medications    Prior to Admission medications   Medication Sig Start Date End Date Taking? Authorizing Provider  amLODipine (NORVASC) 10 MG tablet Take 10 mg by mouth daily.    Historical Provider, MD  aspirin EC 81 MG tablet Take 81 mg by mouth daily.    Historical Provider, MD  b complex vitamins tablet Take 1 tablet by mouth daily.    Historical Provider, MD  cyclobenzaprine (FLEXERIL) 10 MG tablet Take 1 tablet (10 mg total) by mouth 3 (three) times daily as needed for muscle spasms. 05/21/16   Dalia Heading, PA-C  gabapentin (NEURONTIN) 100 MG capsule Take 100 mg by mouth 3 (three) times daily. 06/10/16   Historical Provider, MD  hydrALAZINE (APRESOLINE) 100 MG tablet Take 1 tablet (100 mg total) by mouth every 8 (eight) hours. 01/30/15   Reyne Dumas, MD  HYDROcodone-acetaminophen (NORCO/VICODIN) 5-325 MG tablet Take 1 tablet by mouth every 6 (six) hours as needed for moderate pain. 05/21/16   Dalia Heading, PA-C  ibuprofen (ADVIL,MOTRIN) 800 MG tablet Take 800 mg by mouth every 8 (eight) hours as  needed. pain 06/10/16   Historical Provider, MD  isosorbide dinitrate (ISORDIL) 10 MG tablet Take 1 tablet (10 mg total) by mouth 3 (three) times daily. 12/27/13   Debbe Odea, MD  linezolid (ZYVOX) 600 MG tablet Take 600 mg by mouth 2 (two) times daily. For 14 days    Historical Provider, MD  metFORMIN (GLUCOPHAGE) 500 MG tablet Take 500 mg by mouth 2 (two) times daily with a meal.    Historical Provider, MD  naproxen (NAPROSYN) 375 MG tablet Take 1 tablet (375 mg total) by mouth 2 (two) times daily. 01/03/16   [name], MD  ondansetron (ZOFRAN) 4 MG tablet Take 1 tablet (4 mg total) by mouth every 6 (six) hours. 03/20/16   Montine Circle, PA-C  oxyCODONE-acetaminophen (PERCOCET/ROXICET) 5-325 MG tablet Take 1 tablet by mouth every 8 (eight) hours as needed for moderate pain.  06/10/16   Historical Provider, MD  pantoprazole (PROTONIX) 20 MG tablet Take 1 tablet (20 mg total) by mouth daily. 07/02/15   [name], MD  pravastatin (PRAVACHOL) 20 MG tablet Take 20 mg by mouth at bedtime.    Historical Provider, MD  tamsulosin (FLOMAX) 0.4 MG CAPS capsule Take 1 capsule (0.4 mg total) by mouth daily. 07/05/16   Ozella Almond Ward, PA-C  tamsulosin (FLOMAX) 0.4 MG CAPS capsule Take 1 capsule (0.4 mg total) by mouth daily. 07/18/16   Lahoma Crocker Barrett, PA-C    Family History Family History  Problem Relation Age of Onset  . CAD Father     "heart attack in his 77's"  . Hypertension Father   . Diabetes Brother   . Diabetes Sister   . Hypertension Mother     Social History Social History  Substance Use Topics  . Smoking status: Current Every Day Smoker    Packs/day: 0.00    Types: Cigarettes  . Smokeless tobacco: Current User  . Alcohol use Yes     Allergies   Review of patient's allergies indicates no known allergies.   Review of Systems Review of Systems A 10 point review of systems was completed and was negative except for pertinent positives and negatives as mentioned in the history of present illness    Physical Exam Updated Vital Signs BP 175/59 (BP Location: Right Arm)   Pulse (!) 59   Temp 98.9 F (37.2 C) (Oral)   Resp 15   Ht 5\' 9"  (1.753 m)   Wt 70.8 kg   SpO2 100%   BMI 23.04 kg/m   Physical Exam  Constitutional: He appears well-developed. No distress.  Awake, alert and nontoxic in appearance  HENT:  Head: Normocephalic and atraumatic.  Right Ear: External ear normal.  Left Ear: External ear normal.  Mouth/Throat: Oropharynx is clear and moist.  Eyes: Conjunctivae  and EOM are normal. Pupils are equal, round, and reactive to light.  Neck: Normal range of motion. No JVD present.  Cardiovascular: Normal rate, regular rhythm and normal heart sounds.   Pulmonary/Chest: Effort normal and breath sounds normal. No stridor.  Abdominal: Soft.  Mild diffuse abdominal tenderness with no rebound or guarding. No peritoneal signs. No mass or hernia  Musculoskeletal: Normal range of motion.  Neurological:  Awake, alert, cooperative and aware of situation; motor strength bilaterally; sensation normal to light touch bilaterally; no facial asymmetry; tongue midline; major cranial nerves appear intact;  baseline gait without new ataxia.  Skin: No rash noted. He is not diaphoretic.  Psychiatric: He has a normal mood and affect. His behavior is  normal. Thought content normal.  Nursing note and vitals reviewed.    ED Treatments / Results  Labs (all labs ordered are listed, but only abnormal results are displayed) Labs Reviewed  ETHANOL  URINE RAPID DRUG SCREEN, HOSP PERFORMED  URINALYSIS, ROUTINE W REFLEX MICROSCOPIC (NOT AT [facility])    EKG  EKG Interpretation None       Radiology No results found.  Procedures Procedures (including critical care time)  Medications Ordered in ED Medications  famotidine (PEPCID) tablet 20 mg (20 mg Oral Given 07/23/16 0849)  ondansetron (ZOFRAN-ODT) disintegrating tablet 4 mg (4 mg Oral Given 07/23/16 0849)  gi cocktail (Maalox,Lidocaine,Donnatal) (30 mLs Oral Given 07/23/16 0849)     Initial Impression / Assessment and Plan / ED Course  I have reviewed the triage vital signs and the nursing notes.  Pertinent labs & imaging results that were available during my care of the patient were reviewed by me and considered in my medical decision making (see chart for details).  Clinical Course   Vitals:   07/23/16 0652 07/23/16 0815 07/23/16 0845 07/23/16 0915  BP: 148/72 174/65 157/65 175/59  Pulse: 70 70 68 (!) 59  Resp: 16   16 15   Temp: 98.9 F (37.2 C)     TempSrc: Oral     SpO2: 100% 99% 100% 100%  Weight: 70.8 kg     Height: 5\' 9"  (1.753 m)        Patient with acute on chronic abdominal pain. Seen this morning, reassuring CBC, CMP, lipase. Currently on antibody therapy for previously diagnosed UTI, culture obtained on 7/20 was negative. We will obtain urinalysis to ensure resolution, UDS and ethanol. Given Pepcid, Zofran. 09:45--patient sleeping comfortably, has not provided urine sample. Discussed we'll need this in order to complete medical evaluation. 10:20--patient still sleeping in no apparent distress, refuses to give urine sample. States he is leaving. Discussed this Would result in him having to sign out against medical advice. He verbalizes agreement and assumes this risk possibility. He is of sound mind to make this decision. He is to follow-up with his urologist for regularly scheduled appointment on Friday. Prior to patient leaving, I discussed and reviewed this case with Dr.Kohut   Final Clinical Impressions(s) / ED Diagnoses   Final diagnoses:  Generalized abdominal pain    New Prescriptions New Prescriptions   No medications on file     Comer Locket, PA-C 07/23/16 [place], MD 07/24/16 [phone]

## 2016-07-23 NOTE — ED Notes (Signed)
Pt. Refusing to I&O cath and giving urine sample.

## 2016-07-23 NOTE — ED Notes (Addendum)
Pt. Being wheeled back and vomited large volume of bright green emesis on the floor. Pt. Demanding to have warm blankets and sleep in his room at this time.

## 2016-07-23 NOTE — ED Triage Notes (Signed)
Patient reports generalized abdominal pain with nausea onset yesterday , pt. was sen here last night for the same complaints , blood tests done and was discharged home . Denies diarrhea , fever or chills .

## 2016-07-23 NOTE — ED Notes (Signed)
Dr. Oni at the bedside.  

## 2016-07-23 NOTE — ED Notes (Signed)
EDP at bedside  

## 2016-07-23 NOTE — ED Provider Notes (Signed)
[facility] DEPT Provider Note   CSN: [id number] Arrival date & time: 07/22/16  2134  First Provider Contact:  2:38 AM   By signing my name below, I, Reola Mosher, attest that this documentation has been prepared under the direction and in the presence of Everlene Balls, MD. Electronically Signed: Reola Mosher, ED Scribe. 07/23/16. 3:10 AM.  History   Chief Complaint Chief Complaint  Patient presents with  . Abdominal Pain   The history is provided by the patient. No language interpreter was used.   HPI Comments: [name] is a 65 y.o. male with a PMHx of EtOH abuse, CHF, CVA, DM, HTN, and Narcotic abuse, who presents to the Emergency Department complaining of gradual onset, gradually worsening, constant, burning, diffuse abdominal pain x 1 day. Pt has had small amounts diarrhea and lower back pain since the onset of his abdominal pain. Pt is currently taking abx for a UTI that was dx'd ~6 days PTA, and states that he is still experiencing intermittent episodes of hematuria. He has been compliant with his abx. Pt is followed by a  PCP. Denies emesis, diarrhea, fever, chills, other urinary issues, new rashes, or any other symptoms.  Past Medical History:  Diagnosis Date  . Alcohol dependence ([place]) 12/10/2013  . Angioedema of lips 12/24/2013  . Brain tumor ([place])   . CHF (congestive heart failure) ([facility])    diastolic  . Chronic back pain   . Chronic headache   . CVA (cerebral vascular accident) ([place])   . Diabetes mellitus   . HTN (hypertension), malignant 09/06/2012  . Hypertension   . Narcotic abuse   . Normal cardiac stress test 2009  . TIA (transient ischemic attack) 09/06/2012  . Tobacco use disorder 12/10/2013  . Vertigo 09/06/2012    Patient Active Problem List   Diagnosis Date Noted  . Chronic diastolic heart failure ([place]) 03/20/2015  . Chest pain 03/18/2015  . History of TIA (transient ischemic attack) 01/27/2015  . Enterococcus UTI 01/27/2015  .  Cough 01/27/2015  . Bronchitis 01/27/2015  . HCAP (healthcare-associated pneumonia)   . CHF (congestive heart failure) ([place]) 01/26/2015  . History of alcohol abuse 01/26/2015  . Pyelonephritis 01/12/2015  . Prostatitis 01/12/2015  . Essential hypertension 11/03/2014  . Hypokalemia 11/03/2014  . Chronic pain 12/24/2013  . Tobacco use disorder 12/10/2013  . Leukocytosis 12/10/2013  . Diabetes mellitus type 2, controlled ([place]) 09/06/2012  . Pain in the side 09/06/2012  . Pituitary adenoma ([place]) 09/06/2012    Past Surgical History:  Procedure Laterality Date  . NO PAST SURGERIES       Home Medications    Prior to Admission medications   Medication Sig Start Date End Date Taking? Authorizing Provider  amLODipine (NORVASC) 10 MG tablet Take 10 mg by mouth daily.    Historical Provider, MD  aspirin EC 81 MG tablet Take 81 mg by mouth daily.    Historical Provider, MD  b complex vitamins tablet Take 1 tablet by mouth daily.    Historical Provider, MD  cyclobenzaprine (FLEXERIL) 10 MG tablet Take 1 tablet (10 mg total) by mouth 3 (three) times daily as needed for muscle spasms. 05/21/16   Dalia Heading, PA-C  gabapentin (NEURONTIN) 100 MG capsule Take 100 mg by mouth 3 (three) times daily. 06/10/16   Historical Provider, MD  hydrALAZINE (APRESOLINE) 100 MG tablet Take 1 tablet (100 mg total) by mouth every 8 (eight) hours. 01/30/15   Reyne Dumas, MD  HYDROcodone-acetaminophen (NORCO/VICODIN) [phone]  MG tablet Take 1 tablet by mouth every 6 (six) hours as needed for moderate pain. 05/21/16   Dalia Heading, PA-C  ibuprofen (ADVIL,MOTRIN) 800 MG tablet Take 800 mg by mouth every 8 (eight) hours as needed. pain 06/10/16   Historical Provider, MD  isosorbide dinitrate (ISORDIL) 10 MG tablet Take 1 tablet (10 mg total) by mouth 3 (three) times daily. 12/27/13   Debbe Odea, MD  linezolid (ZYVOX) 600 MG tablet Take 600 mg by mouth 2 (two) times daily. For 14 days    Historical Provider, MD    metFORMIN (GLUCOPHAGE) 500 MG tablet Take 500 mg by mouth 2 (two) times daily with a meal.    Historical Provider, MD  naproxen (NAPROSYN) 375 MG tablet Take 1 tablet (375 mg total) by mouth 2 (two) times daily. 01/03/16   [name], MD  ondansetron (ZOFRAN) 4 MG tablet Take 1 tablet (4 mg total) by mouth every 6 (six) hours. 03/20/16   Montine Circle, PA-C  oxyCODONE-acetaminophen (PERCOCET/ROXICET) 5-325 MG tablet Take 1 tablet by mouth every 8 (eight) hours as needed for moderate pain.  06/10/16   Historical Provider, MD  pantoprazole (PROTONIX) 20 MG tablet Take 1 tablet (20 mg total) by mouth daily. 07/02/15   [name], MD  pravastatin (PRAVACHOL) 20 MG tablet Take 20 mg by mouth at bedtime.    Historical Provider, MD  tamsulosin (FLOMAX) 0.4 MG CAPS capsule Take 1 capsule (0.4 mg total) by mouth daily. 07/05/16   Ozella Almond Ward, PA-C  tamsulosin (FLOMAX) 0.4 MG CAPS capsule Take 1 capsule (0.4 mg total) by mouth daily. 07/18/16   Lahoma Crocker Barrett, PA-C    Family History Family History  Problem Relation Age of Onset  . CAD Father     "heart attack in his 20's"  . Hypertension Father   . Diabetes Brother   . Diabetes Sister   . Hypertension Mother     Social History Social History  Substance Use Topics  . Smoking status: Current Every Day Smoker    Packs/day: 0.00    Types: Cigarettes  . Smokeless tobacco: Current User  . Alcohol use Yes     Allergies   Review of patient's allergies indicates no known allergies.   Review of Systems Review of Systems 10 Systems reviewed and all are negative for acute change except as noted in the HPI.  Physical Exam Updated Vital Signs BP (!) 170/52   Pulse 76   Temp 99.6 F (37.6 C)   Resp 18   SpO2 100%   Physical Exam  Constitutional: He is oriented to person, place, and time. Vital signs are normal. He appears well-developed and well-nourished.  Non-toxic appearance. He does not appear ill. No distress.  HENT:  Head:  Normocephalic and atraumatic.  Nose: Nose normal.  Mouth/Throat: Oropharynx is clear and moist. No oropharyngeal exudate.  Eyes: Conjunctivae and EOM are normal. Pupils are equal, round, and reactive to light. No scleral icterus.  Neck: Normal range of motion. Neck supple. No tracheal deviation, no edema, no erythema and normal range of motion present. No thyroid mass and no thyromegaly present.  Cardiovascular: Normal rate, regular rhythm, S1 normal, S2 normal, normal heart sounds, intact distal pulses and normal pulses.  Exam reveals no gallop and no friction rub.   No murmur heard. Pulmonary/Chest: Effort normal and breath sounds normal. No respiratory distress. He has no wheezes. He has no rhonchi. He has no rales.  Abdominal: Soft. Normal appearance and bowel sounds are normal.  He exhibits no distension, no ascites and no mass. There is no hepatosplenomegaly. There is no rebound, no guarding and no CVA tenderness.  Musculoskeletal: Normal range of motion. He exhibits no edema or tenderness.  Lymphadenopathy:    He has no cervical adenopathy.  Neurological: He is alert and oriented to person, place, and time. He has normal strength. No cranial nerve deficit or sensory deficit.  Skin: Skin is warm, dry and intact. No petechiae and no rash noted. He is not diaphoretic. No erythema. No pallor.  Nursing note and vitals reviewed.  ED Treatments / Results  DIAGNOSTIC STUDIES: Oxygen Saturation is 100% on RA, normal by my interpretation.   COORDINATION OF CARE: 2:44 AM Plan to DC home with PCP fu. Pt verbalized understanding and is agreeable with the plan.   Labs (all labs ordered are listed, but only abnormal results are displayed) Labs Reviewed  COMPREHENSIVE METABOLIC PANEL - Abnormal; Notable for the following:       Result Value   Glucose, Bld 122 (*)    All other components within normal limits  CBC - Abnormal; Notable for the following:    Hemoglobin 11.2 (*)    HCT 35.0 (*)     MCV 73.7 (*)    MCH 23.6 (*)    All other components within normal limits  LIPASE, BLOOD  URINALYSIS, ROUTINE W REFLEX MICROSCOPIC (NOT AT [facility])    EKG  EKG Interpretation None       Radiology No results found.  Procedures Procedures (including critical care time)  Medications Ordered in ED Medications - No data to display   Initial Impression / Assessment and Plan / ED Course  I have reviewed the triage vital signs and the nursing notes.  Pertinent labs & imaging results that were available during my care of the patient were reviewed by me and considered in my medical decision making (see chart for details).  Clinical Course    Patient presents to the ED for abd pain.  He has a long standing history of chronic abdominal pain, with multiple neg CT scans.  His physical exam is unremarkable. No evidence of vomiting or diarrhea in the ED. Plan to Dc with PCP fu within 3 days. He appears well and in NAD. vS remain within his normal limits and he is safe for DC.  Final Clinical Impressions(s) / ED Diagnoses   Final diagnoses:  None    New Prescriptions New Prescriptions   No medications on file     I personally performed the services described in this documentation, which was scribed in my presence. The recorded information has been reviewed and is accurate.       Everlene Balls, MD 07/23/16 [phone]

## 2016-07-23 NOTE — ED Notes (Signed)
Pt. Still refusing to urinate. Pt. Told by EDP that he could leave AMA. Pt. Advised of risks and still leaving AMA. Pt. Wheeled out by tech at this time.

## 2016-09-04 ENCOUNTER — Emergency Department (HOSPITAL_COMMUNITY): Payer: Medicaid Other

## 2016-09-04 ENCOUNTER — Encounter (HOSPITAL_COMMUNITY): Payer: Self-pay | Admitting: Emergency Medicine

## 2016-09-04 ENCOUNTER — Emergency Department (HOSPITAL_COMMUNITY)
Admission: EM | Admit: 2016-09-04 | Discharge: 2016-09-04 | Disposition: A | Discharge status: Home / Self Care | Payer: Medicaid Other | Attending: Emergency Medicine | Admitting: Emergency Medicine

## 2016-09-04 DIAGNOSIS — K649 Unspecified hemorrhoids: Secondary | ICD-10-CM | POA: Diagnosis not present

## 2016-09-04 DIAGNOSIS — E119 Type 2 diabetes mellitus without complications: Secondary | ICD-10-CM | POA: Diagnosis not present

## 2016-09-04 DIAGNOSIS — J441 Chronic obstructive pulmonary disease with (acute) exacerbation: Secondary | ICD-10-CM

## 2016-09-04 DIAGNOSIS — I110 Hypertensive heart disease with heart failure: Secondary | ICD-10-CM | POA: Insufficient documentation

## 2016-09-04 DIAGNOSIS — Z79899 Other long term (current) drug therapy: Secondary | ICD-10-CM | POA: Diagnosis not present

## 2016-09-04 DIAGNOSIS — F17210 Nicotine dependence, cigarettes, uncomplicated: Secondary | ICD-10-CM | POA: Insufficient documentation

## 2016-09-04 DIAGNOSIS — M791 Myalgia: Secondary | ICD-10-CM | POA: Diagnosis present

## 2016-09-04 DIAGNOSIS — I5032 Chronic diastolic (congestive) heart failure: Secondary | ICD-10-CM | POA: Insufficient documentation

## 2016-09-04 DIAGNOSIS — K921 Melena: Secondary | ICD-10-CM | POA: Insufficient documentation

## 2016-09-04 DIAGNOSIS — Z8673 Personal history of transient ischemic attack (TIA), and cerebral infarction without residual deficits: Secondary | ICD-10-CM | POA: Diagnosis not present

## 2016-09-04 DIAGNOSIS — J449 Chronic obstructive pulmonary disease, unspecified: Secondary | ICD-10-CM | POA: Insufficient documentation

## 2016-09-04 DIAGNOSIS — Z7984 Long term (current) use of oral hypoglycemic drugs: Secondary | ICD-10-CM | POA: Diagnosis not present

## 2016-09-04 LAB — CBC
HCT: 36.7 % — ABNORMAL LOW (ref 39.0–52.0)
Hemoglobin: 11.5 g/dL — ABNORMAL LOW (ref 13.0–17.0)
MCH: 23.6 pg — AB (ref 26.0–34.0)
MCHC: 31.3 g/dL (ref 30.0–36.0)
MCV: 75.4 fL — AB (ref 78.0–100.0)
PLATELETS: 288 10*3/uL (ref 150–400)
RBC: 4.87 MIL/uL (ref 4.22–5.81)
RDW: 15.1 % (ref 11.5–15.5)
WBC: 11.9 10*3/uL — ABNORMAL HIGH (ref 4.0–10.5)

## 2016-09-04 LAB — COMPREHENSIVE METABOLIC PANEL
ALBUMIN: 3.8 g/dL (ref 3.5–5.0)
ALK PHOS: 65 U/L (ref 38–126)
ALT: 28 U/L (ref 17–63)
AST: 39 U/L (ref 15–41)
Anion gap: 9 (ref 5–15)
BUN: 15 mg/dL (ref 6–20)
CALCIUM: 9.7 mg/dL (ref 8.9–10.3)
CHLORIDE: 108 mmol/L (ref 101–111)
CO2: 23 mmol/L (ref 22–32)
CREATININE: 0.81 mg/dL (ref 0.61–1.24)
GFR calc non Af Amer: >60 mL/min (ref >60)
GLUCOSE: 100 mg/dL — AB (ref 65–99)
Potassium: 4.3 mmol/L (ref 3.5–5.1)
SODIUM: 140 mmol/L (ref 135–145)
Total Bilirubin: 0.3 mg/dL (ref 0.3–1.2)
Total Protein: 7.8 g/dL (ref 6.5–8.1)

## 2016-09-04 LAB — ABO/RH: ABO/RH(D): B POS

## 2016-09-04 LAB — TYPE AND SCREEN
ABO/RH(D): B POS
Antibody Screen: NEGATIVE

## 2016-09-04 MED ORDER — ALBUTEROL SULFATE HFA 108 (90 BASE) MCG/ACT IN AERS
INHALATION_SPRAY | RESPIRATORY_TRACT | Status: AC
Start: 2016-09-04 — End: 2016-09-04
  Administered 2016-09-04
  Filled 2016-09-04: qty 6.7

## 2016-09-04 MED ORDER — ALBUTEROL SULFATE (2.5 MG/3ML) 0.083% IN NEBU: 2.5000 mg | INHALATION_SOLUTION | Freq: Once | RESPIRATORY_TRACT | Status: DC

## 2016-09-04 MED ORDER — HYDROCORTISONE ACETATE 25 MG RE SUPP: 25.0000 mg | Freq: Two times a day (BID) | RECTAL | 0 refills | Status: DC

## 2016-09-04 MED ORDER — DOXYCYCLINE HYCLATE 100 MG PO CAPS: 100.0000 mg | ORAL_CAPSULE | Freq: Two times a day (BID) | ORAL | 0 refills | Status: DC

## 2016-09-04 MED ORDER — BENZONATATE 100 MG PO CAPS
200.0000 mg | ORAL_CAPSULE | Freq: Once | ORAL | Status: AC
  Administered 2016-09-04: 200 mg via ORAL
  Filled 2016-09-04: qty 2

## 2016-09-04 MED ORDER — PREDNISONE 20 MG PO TABS: 20.0000 mg | ORAL_TABLET | Freq: Two times a day (BID) | ORAL | 0 refills | Status: DC

## 2016-09-04 MED ORDER — PREDNISONE 20 MG PO TABS
60.0000 mg | ORAL_TABLET | Freq: Once | ORAL | Status: AC
  Administered 2016-09-04: 60 mg via ORAL
  Filled 2016-09-04: qty 3

## 2016-09-04 MED ORDER — ALBUTEROL SULFATE HFA 108 (90 BASE) MCG/ACT IN AERS: 1.0000 | INHALATION_SPRAY | Freq: Four times a day (QID) | RESPIRATORY_TRACT | 0 refills | Status: AC | PRN

## 2016-09-04 MED ORDER — ALBUTEROL SULFATE HFA 108 (90 BASE) MCG/ACT IN AERS: 2.0000 | INHALATION_SPRAY | Freq: Once | RESPIRATORY_TRACT | Status: DC

## 2016-09-04 NOTE — ED Provider Notes (Signed)
[facility]-EMERGENCY DEPT Provider Note   CSN: [id number] Arrival date & time: 09/04/16  1239     History   Chief Complaint Chief Complaint  Patient presents with  . Generalized Body Aches  . Blood In Stools    HPI [name] is a 65 y.o. male.  He has several complaints. First he is had a cough since this morning and he is concerned that it could be his congestive heart failure. Second, he states that he had a bowel movement this morning there was some blood on it he became worried. He has not had another bowel movement rest today because "I was afraid".  Has no history of GI bleeding. He is not anticoagulated. He has never had a screening colonoscopy per his report. He is a lifetime smoker. He has been told that he has COPD and congestive heart failure. He states "I ran out of those inhalers".  No dependent edema. No chest pain. Frequent cough. No fevers.  HPI  Past Medical History:  Diagnosis Date  . Alcohol dependence ([facility]) 12/10/2013  . Angioedema of lips 12/24/2013  . Brain tumor ([facility])   . CHF (congestive heart failure) ([facility])    diastolic  . Chronic back pain   . Chronic headache   . CVA (cerebral vascular accident) ([facility])   . Diabetes mellitus   . HTN (hypertension), malignant 09/06/2012  . Hypertension   . Narcotic abuse   . Normal cardiac stress test 2009  . TIA (transient ischemic attack) 09/06/2012  . Tobacco use disorder 12/10/2013  . Vertigo 09/06/2012    Patient Active Problem List   Diagnosis Date Noted  . Chronic diastolic heart failure ([facility]) 03/20/2015  . Chest pain 03/18/2015  . History of TIA (transient ischemic attack) 01/27/2015  . Enterococcus UTI 01/27/2015  . Cough 01/27/2015  . Bronchitis 01/27/2015  . HCAP (healthcare-associated pneumonia)   . CHF (congestive heart failure) ([facility]) 01/26/2015  . History of alcohol abuse 01/26/2015  . Pyelonephritis 01/12/2015  . Prostatitis 01/12/2015  . Essential hypertension 11/03/2014  . Hypokalemia  11/03/2014  . Chronic pain 12/24/2013  . Tobacco use disorder 12/10/2013  . Leukocytosis 12/10/2013  . Diabetes mellitus type 2, controlled ([facility]) 09/06/2012  . Pain in the side 09/06/2012  . Pituitary adenoma ([facility]) 09/06/2012    Past Surgical History:  Procedure Laterality Date  . NO PAST SURGERIES         Home Medications    Prior to Admission medications   Medication Sig Start Date End Date Taking? Authorizing Provider  amLODipine (NORVASC) 10 MG tablet Take 10 mg by mouth daily.   Yes Historical Provider, MD  aspirin EC 81 MG tablet Take 81 mg by mouth daily.   Yes Historical Provider, MD  b complex vitamins tablet Take 1 tablet by mouth daily.   Yes Historical Provider, MD  cyclobenzaprine (FLEXERIL) 10 MG tablet Take 1 tablet (10 mg total) by mouth 3 (three) times daily as needed for muscle spasms. 05/21/16  Yes Christopher Lawyer, PA-C  gabapentin (NEURONTIN) 100 MG capsule Take 100 mg by mouth 3 (three) times daily. 06/10/16  Yes Historical Provider, MD  hydrALAZINE (APRESOLINE) 100 MG tablet Take 1 tablet (100 mg total) by mouth every 8 (eight) hours. 01/30/15  Yes Richarda Overlie, MD  HYDROcodone-acetaminophen (NORCO/VICODIN) 5-325 MG tablet Take 1 tablet by mouth every 6 (six) hours as needed for moderate pain. 05/21/16  Yes Christopher Lawyer, PA-C  ibuprofen (ADVIL,MOTRIN) 800 MG tablet Take 800 mg by mouth every  8 (eight) hours as needed. pain 06/10/16  Yes Historical Provider, MD  isosorbide dinitrate (ISORDIL) 10 MG tablet Take 1 tablet (10 mg total) by mouth 3 (three) times daily. 12/27/13  Yes Debbe Odea, MD  linezolid (ZYVOX) 600 MG tablet Take 600 mg by mouth 2 (two) times daily. For 14 days   Yes Historical Provider, MD  metFORMIN (GLUCOPHAGE) 500 MG tablet Take 500 mg by mouth 2 (two) times daily with a meal.   Yes Historical Provider, MD  naproxen (NAPROSYN) 375 MG tablet Take 1 tablet (375 mg total) by mouth 2 (two) times daily. 01/03/16  Yes Ankit Nanavati, MD    ondansetron (ZOFRAN) 4 MG tablet Take 1 tablet (4 mg total) by mouth every 6 (six) hours. Patient taking differently: Take 4 mg by mouth every 8 (eight) hours as needed for nausea.  03/20/16  Yes Montine Circle, PA-C  oxyCODONE-acetaminophen (PERCOCET/ROXICET) 5-325 MG tablet Take 1 tablet by mouth every 8 (eight) hours as needed for moderate pain.  06/10/16  Yes Historical Provider, MD  pantoprazole (PROTONIX) 20 MG tablet Take 1 tablet (20 mg total) by mouth daily. 07/02/15  Yes [name], MD  pravastatin (PRAVACHOL) 20 MG tablet Take 20 mg by mouth at bedtime.   Yes Historical Provider, MD  tamsulosin (FLOMAX) 0.4 MG CAPS capsule Take 1 capsule (0.4 mg total) by mouth daily. 07/18/16  Yes Stevi Barrett, PA-C  albuterol (PROVENTIL HFA;VENTOLIN HFA) 108 (90 Base) MCG/ACT inhaler Inhale 1-2 puffs into the lungs every 6 (six) hours as needed for wheezing. 09/04/16   Tanna Furry, MD  doxycycline (VIBRAMYCIN) 100 MG capsule Take 1 capsule (100 mg total) by mouth 2 (two) times daily. 09/04/16   Tanna Furry, MD  hydrocortisone (ANUSOL-HC) 25 MG suppository Place 1 suppository (25 mg total) rectally 2 (two) times daily. 09/04/16   Tanna Furry, MD  predniSONE (DELTASONE) 20 MG tablet Take 1 tablet (20 mg total) by mouth 2 (two) times daily with a meal. 09/04/16   Tanna Furry, MD  tamsulosin (FLOMAX) 0.4 MG CAPS capsule Take 1 capsule (0.4 mg total) by mouth daily. Patient not taking: Reported on 09/04/2016 07/05/16   Ozella Almond Ward, PA-C    Family History Family History  Problem Relation Age of Onset  . CAD Father     "heart attack in his 20's"  . Hypertension Father   . Diabetes Brother   . Diabetes Sister   . Hypertension Mother     Social History Social History  Substance Use Topics  . Smoking status: Current Every Day Smoker    Packs/day: 0.50    Types: Cigarettes  . Smokeless tobacco: Current User  . Alcohol use Yes     Allergies   Review of patient's allergies indicates no known  allergies.   Review of Systems Review of Systems   Physical Exam Updated Vital Signs BP 167/64   Pulse 76   Temp 98.3 F (36.8 C)   Resp 18   Ht 5\' 9"  (1.753 m)   Wt 160 lb (72.6 kg)   SpO2 100%   BMI 23.63 kg/m   Physical Exam   ED Treatments / Results  Labs (all labs ordered are listed, but only abnormal results are displayed) Labs Reviewed  COMPREHENSIVE METABOLIC PANEL - Abnormal; Notable for the following:       Result Value   Glucose, Bld 100 (*)    All other components within normal limits  CBC - Abnormal; Notable for the following:  WBC 11.9 (*)    Hemoglobin 11.5 (*)    HCT 36.7 (*)    MCV 75.4 (*)    MCH 23.6 (*)    All other components within normal limits  POC OCCULT BLOOD, ED  TYPE AND SCREEN  ABO/RH    EKG  EKG Interpretation None       Radiology Dg Chest 2 View  Result Date: 09/04/2016 CLINICAL DATA:  Acute onset of cough, congestion and wheezing. Generalized chest pain. Initial encounter. EXAM: CHEST  2 VIEW COMPARISON:  Chest radiograph performed 03/08/2016, and CTA of the chest performed 03/09/2016 FINDINGS: The lungs are well-aerated. Peribronchial thickening is noted. Mild left basilar atelectasis is seen. There is no evidence of pleural effusion or pneumothorax. The heart is borderline normal in size. No acute osseous abnormalities are seen. IMPRESSION: Peribronchial thickening noted; mild left basilar atelectasis seen. Electronically Signed   By: Garald Balding M.D.   On: 09/04/2016 21:54    Procedures Procedures (including critical care time)  Medications Ordered in ED Medications  predniSONE (DELTASONE) tablet 60 mg (not administered)  albuterol (PROVENTIL) (2.5 MG/3ML) 0.083% nebulizer solution 2.5 mg (not administered)  benzonatate (TESSALON) capsule 200 mg (not administered)     Initial Impression / Assessment and Plan / ED Course  I have reviewed the triage vital signs and the nursing notes.  Pertinent labs & imaging  results that were available during my care of the patient were reviewed by me and considered in my medical decision making (see chart for details).  Clinical Course    Given albuterol. Given by mouth prednisone. Plan is discharge home. Initial suppositories. I told in no uncertain terms that I felt it was likely has bleeding was from an internal hemorrhoid. However I discussed with him the possibility of other etiologies including diverticular bleeding, cancer including colorectal cancer. Advised to follow-up with his physician. Asked him to call tomorrow for an appointment to discuss a GI referral for colonoscopy. He expressed understanding of this. Advised return if worsening bleeding or other new symptoms.  Final Clinical Impressions(s) / ED Diagnoses   Final diagnoses:  COPD exacerbation ([place])  Hematochezia  Hemorrhoids, unspecified hemorrhoid type    New Prescriptions New Prescriptions   ALBUTEROL (PROVENTIL HFA;VENTOLIN HFA) 108 (90 BASE) MCG/ACT INHALER    Inhale 1-2 puffs into the lungs every 6 (six) hours as needed for wheezing.   DOXYCYCLINE (VIBRAMYCIN) 100 MG CAPSULE    Take 1 capsule (100 mg total) by mouth 2 (two) times daily.   HYDROCORTISONE (ANUSOL-HC) 25 MG SUPPOSITORY    Place 1 suppository (25 mg total) rectally 2 (two) times daily.   PREDNISONE (DELTASONE) 20 MG TABLET    Take 1 tablet (20 mg total) by mouth 2 (two) times daily with a meal.     Tanna Furry, MD 09/04/16 2324

## 2016-09-04 NOTE — ED Triage Notes (Signed)
Pt states he has been having body aches, unsure if he has fever. This morning, pt reports he had bright red blood in his stool. Also complains of coughing up mucous. Pt states is right thumb is also bothering him, no recent injury. Pt states he does feel lightheaded at times.

## 2016-09-04 NOTE — ED Notes (Signed)
MD at bedside. 

## 2016-09-04 NOTE — Discharge Instructions (Signed)
You should see your doctor at the first available visit to discuss GI referral.  You need to have a colonoscopy to ensure that your blood is from a hemorrhoid, not from a more serious condition such as rectal or colon cancer.  Hemorrhoid bleeding should be small amounts. It should slowly improve. If you have worsening bleeding, or passing blood without bowel movements please recheck in the emergency room.

## 2016-11-01 ENCOUNTER — Emergency Department (HOSPITAL_COMMUNITY): Payer: Medicaid Other

## 2016-11-01 ENCOUNTER — Inpatient Hospital Stay (HOSPITAL_COMMUNITY)
Admission: EM | Admit: 2016-11-01 | Discharge: 2016-11-03 | DRG: 872 | Discharge status: Against Medical Advice | Payer: Medicaid Other | Attending: Internal Medicine | Admitting: Internal Medicine

## 2016-11-01 ENCOUNTER — Encounter (HOSPITAL_COMMUNITY): Payer: Self-pay | Admitting: *Deleted

## 2016-11-01 DIAGNOSIS — Z7984 Long term (current) use of oral hypoglycemic drugs: Secondary | ICD-10-CM | POA: Diagnosis not present

## 2016-11-01 DIAGNOSIS — Z7952 Long term (current) use of systemic steroids: Secondary | ICD-10-CM | POA: Diagnosis not present

## 2016-11-01 DIAGNOSIS — E785 Hyperlipidemia, unspecified: Secondary | ICD-10-CM | POA: Diagnosis present

## 2016-11-01 DIAGNOSIS — N390 Urinary tract infection, site not specified: Secondary | ICD-10-CM | POA: Diagnosis present

## 2016-11-01 DIAGNOSIS — I248 Other forms of acute ischemic heart disease: Secondary | ICD-10-CM | POA: Diagnosis present

## 2016-11-01 DIAGNOSIS — F17210 Nicotine dependence, cigarettes, uncomplicated: Secondary | ICD-10-CM | POA: Diagnosis present

## 2016-11-01 DIAGNOSIS — I5032 Chronic diastolic (congestive) heart failure: Secondary | ICD-10-CM | POA: Diagnosis present

## 2016-11-01 DIAGNOSIS — R110 Nausea: Secondary | ICD-10-CM

## 2016-11-01 DIAGNOSIS — R079 Chest pain, unspecified: Secondary | ICD-10-CM | POA: Diagnosis present

## 2016-11-01 DIAGNOSIS — R509 Fever, unspecified: Secondary | ICD-10-CM | POA: Diagnosis present

## 2016-11-01 DIAGNOSIS — R000 Tachycardia, unspecified: Secondary | ICD-10-CM | POA: Diagnosis present

## 2016-11-01 DIAGNOSIS — Z8249 Family history of ischemic heart disease and other diseases of the circulatory system: Secondary | ICD-10-CM

## 2016-11-01 DIAGNOSIS — R300 Dysuria: Secondary | ICD-10-CM | POA: Diagnosis not present

## 2016-11-01 DIAGNOSIS — A4151 Sepsis due to Escherichia coli [E. coli]: Secondary | ICD-10-CM | POA: Diagnosis present

## 2016-11-01 DIAGNOSIS — I214 Non-ST elevation (NSTEMI) myocardial infarction: Secondary | ICD-10-CM

## 2016-11-01 DIAGNOSIS — Z833 Family history of diabetes mellitus: Secondary | ICD-10-CM | POA: Diagnosis not present

## 2016-11-01 DIAGNOSIS — E876 Hypokalemia: Secondary | ICD-10-CM | POA: Diagnosis present

## 2016-11-01 DIAGNOSIS — I110 Hypertensive heart disease with heart failure: Secondary | ICD-10-CM | POA: Diagnosis present

## 2016-11-01 DIAGNOSIS — I5033 Acute on chronic diastolic (congestive) heart failure: Secondary | ICD-10-CM | POA: Diagnosis present

## 2016-11-01 DIAGNOSIS — D72829 Elevated white blood cell count, unspecified: Secondary | ICD-10-CM | POA: Diagnosis present

## 2016-11-01 DIAGNOSIS — Z7982 Long term (current) use of aspirin: Secondary | ICD-10-CM

## 2016-11-01 DIAGNOSIS — R7989 Other specified abnormal findings of blood chemistry: Secondary | ICD-10-CM

## 2016-11-01 DIAGNOSIS — R0781 Pleurodynia: Secondary | ICD-10-CM | POA: Diagnosis present

## 2016-11-01 DIAGNOSIS — R072 Precordial pain: Secondary | ICD-10-CM | POA: Diagnosis not present

## 2016-11-01 DIAGNOSIS — Z8673 Personal history of transient ischemic attack (TIA), and cerebral infarction without residual deficits: Secondary | ICD-10-CM

## 2016-11-01 DIAGNOSIS — B9620 Unspecified Escherichia coli [E. coli] as the cause of diseases classified elsewhere: Secondary | ICD-10-CM | POA: Diagnosis present

## 2016-11-01 DIAGNOSIS — I10 Essential (primary) hypertension: Secondary | ICD-10-CM | POA: Diagnosis present

## 2016-11-01 DIAGNOSIS — G8929 Other chronic pain: Secondary | ICD-10-CM | POA: Diagnosis present

## 2016-11-01 DIAGNOSIS — E118 Type 2 diabetes mellitus with unspecified complications: Secondary | ICD-10-CM | POA: Diagnosis not present

## 2016-11-01 DIAGNOSIS — F17200 Nicotine dependence, unspecified, uncomplicated: Secondary | ICD-10-CM | POA: Diagnosis present

## 2016-11-01 DIAGNOSIS — R778 Other specified abnormalities of plasma proteins: Secondary | ICD-10-CM

## 2016-11-01 DIAGNOSIS — E119 Type 2 diabetes mellitus without complications: Secondary | ICD-10-CM | POA: Diagnosis present

## 2016-11-01 DIAGNOSIS — R6889 Other general symptoms and signs: Secondary | ICD-10-CM

## 2016-11-01 DIAGNOSIS — K219 Gastro-esophageal reflux disease without esophagitis: Secondary | ICD-10-CM | POA: Diagnosis present

## 2016-11-01 DIAGNOSIS — R748 Abnormal levels of other serum enzymes: Secondary | ICD-10-CM | POA: Diagnosis not present

## 2016-11-01 LAB — HEPATIC FUNCTION PANEL
ALT: 21 U/L (ref 17–63)
AST: 30 U/L (ref 15–41)
Albumin: 3.2 g/dL — ABNORMAL LOW (ref 3.5–5.0)
Alkaline Phosphatase: 64 U/L (ref 38–126)
BILIRUBIN DIRECT: 0.2 mg/dL (ref 0.1–0.5)
BILIRUBIN TOTAL: 0.7 mg/dL (ref 0.3–1.2)
Indirect Bilirubin: 0.5 mg/dL (ref 0.3–0.9)
Total Protein: 8.4 g/dL — ABNORMAL HIGH (ref 6.5–8.1)

## 2016-11-01 LAB — BASIC METABOLIC PANEL
Anion gap: 12 (ref 5–15)
BUN: 17 mg/dL (ref 6–20)
CHLORIDE: 95 mmol/L — AB (ref 101–111)
CO2: 21 mmol/L — ABNORMAL LOW (ref 22–32)
CREATININE: 1.06 mg/dL (ref 0.61–1.24)
Calcium: 8.7 mg/dL — ABNORMAL LOW (ref 8.9–10.3)
GFR calc Af Amer: >60 mL/min (ref >60)
GFR calc non Af Amer: >60 mL/min (ref >60)
GLUCOSE: 183 mg/dL — AB (ref 65–99)
Potassium: 3.3 mmol/L — ABNORMAL LOW (ref 3.5–5.1)
SODIUM: 128 mmol/L — AB (ref 135–145)

## 2016-11-01 LAB — URINALYSIS, ROUTINE W REFLEX MICROSCOPIC
BILIRUBIN URINE: NEGATIVE
GLUCOSE, UA: NEGATIVE mg/dL
KETONES UR: NEGATIVE mg/dL
Nitrite: NEGATIVE
PH: 7.5 (ref 5.0–8.0)
Protein, ur: NEGATIVE mg/dL
Specific Gravity, Urine: 1.011 (ref 1.005–1.030)

## 2016-11-01 LAB — RESPIRATORY PANEL BY PCR
Adenovirus: NOT DETECTED
BORDETELLA PERTUSSIS-RVPCR: NOT DETECTED
CORONAVIRUS 229E-RVPPCR: NOT DETECTED
Chlamydophila pneumoniae: NOT DETECTED
Coronavirus HKU1: NOT DETECTED
Coronavirus NL63: NOT DETECTED
Coronavirus OC43: NOT DETECTED
INFLUENZA A-RVPPCR: NOT DETECTED
INFLUENZA B-RVPPCR: NOT DETECTED
METAPNEUMOVIRUS-RVPPCR: NOT DETECTED
MYCOPLASMA PNEUMONIAE-RVPPCR: NOT DETECTED
PARAINFLUENZA VIRUS 4-RVPPCR: NOT DETECTED
Parainfluenza Virus 1: NOT DETECTED
Parainfluenza Virus 2: NOT DETECTED
Parainfluenza Virus 3: NOT DETECTED
RESPIRATORY SYNCYTIAL VIRUS-RVPPCR: NOT DETECTED
Rhinovirus / Enterovirus: NOT DETECTED

## 2016-11-01 LAB — CBC
HCT: 34.3 % — ABNORMAL LOW (ref 39.0–52.0)
Hemoglobin: 11 g/dL — ABNORMAL LOW (ref 13.0–17.0)
MCH: 22.7 pg — AB (ref 26.0–34.0)
MCHC: 32.1 g/dL (ref 30.0–36.0)
MCV: 70.9 fL — AB (ref 78.0–100.0)
PLATELETS: 222 10*3/uL (ref 150–400)
RBC: 4.84 MIL/uL (ref 4.22–5.81)
RDW: 15 % (ref 11.5–15.5)
WBC: 20.8 10*3/uL — AB (ref 4.0–10.5)

## 2016-11-01 LAB — INFLUENZA PANEL BY PCR (TYPE A & B)
Influenza A By PCR: NEGATIVE
Influenza B By PCR: NEGATIVE

## 2016-11-01 LAB — TROPONIN I
TROPONIN I: 0.19 ng/mL — AB (ref <0.03)
TROPONIN I: 0.05 ng/mL — AB (ref <0.03)
TROPONIN I: 0.1 ng/mL — AB (ref <0.03)
Troponin I: 0.09 ng/mL (ref <0.03)
Troponin I: 0.04 ng/mL (ref <0.03)

## 2016-11-01 LAB — GLUCOSE, CAPILLARY
GLUCOSE-CAPILLARY: 137 mg/dL — AB (ref 65–99)
Glucose-Capillary: 294 mg/dL — ABNORMAL HIGH (ref 65–99)
Glucose-Capillary: 234 mg/dL — ABNORMAL HIGH (ref 65–99)
Glucose-Capillary: 188 mg/dL — ABNORMAL HIGH (ref 65–99)

## 2016-11-01 LAB — URINE MICROSCOPIC-ADD ON

## 2016-11-01 LAB — PROTIME-INR
INR: 1.08
PROTHROMBIN TIME: 14.1 s (ref 11.4–15.2)

## 2016-11-01 LAB — BRAIN NATRIURETIC PEPTIDE: B NATRIURETIC PEPTIDE 5: 215.6 pg/mL — AB (ref 0.0–100.0)

## 2016-11-01 LAB — LACTIC ACID, PLASMA: Lactic Acid, Venous: 1.2 mmol/L (ref 0.5–1.9)

## 2016-11-01 MED ORDER — INSULIN ASPART 100 UNIT/ML ~~LOC~~ SOLN: 0.0000 [IU] | Freq: Every day | SUBCUTANEOUS | Status: DC

## 2016-11-01 MED ORDER — KETOROLAC TROMETHAMINE 15 MG/ML IJ SOLN
15.0000 mg | Freq: Four times a day (QID) | INTRAMUSCULAR | Status: AC
  Administered 2016-11-01 – 2016-11-02 (×4): 15 mg via INTRAVENOUS
  Filled 2016-11-01 (×4): qty 1

## 2016-11-01 MED ORDER — SODIUM CHLORIDE 0.9% FLUSH
3.0000 mL | Freq: Two times a day (BID) | INTRAVENOUS | Status: DC
  Administered 2016-11-01 – 2016-11-03 (×5): 3 mL via INTRAVENOUS

## 2016-11-01 MED ORDER — HYDRALAZINE HCL 50 MG PO TABS
100.0000 mg | ORAL_TABLET | Freq: Three times a day (TID) | ORAL | Status: DC
Start: 2016-11-01 — End: 2016-11-03
  Administered 2016-11-01 – 2016-11-03 (×7): 100 mg via ORAL
  Filled 2016-11-01 (×7): qty 2

## 2016-11-01 MED ORDER — ENSURE ENLIVE PO LIQD
237.0000 mL | Freq: Two times a day (BID) | ORAL | Status: DC
  Administered 2016-11-01 – 2016-11-03 (×5): 237 mL via ORAL

## 2016-11-01 MED ORDER — ACETAMINOPHEN 650 MG RE SUPP: 650.0000 mg | Freq: Four times a day (QID) | RECTAL | Status: DC | PRN

## 2016-11-01 MED ORDER — ISOSORBIDE DINITRATE 10 MG PO TABS
10.0000 mg | ORAL_TABLET | Freq: Three times a day (TID) | ORAL | Status: DC
  Administered 2016-11-01 – 2016-11-03 (×6): 10 mg via ORAL
  Filled 2016-11-01 (×9): qty 1

## 2016-11-01 MED ORDER — ENOXAPARIN SODIUM 40 MG/0.4ML ~~LOC~~ SOLN
40.0000 mg | SUBCUTANEOUS | Status: DC
  Administered 2016-11-01 – 2016-11-03 (×3): 40 mg via SUBCUTANEOUS
  Filled 2016-11-01 (×3): qty 0.4

## 2016-11-01 MED ORDER — ACETAMINOPHEN 500 MG PO TABS
1000.0000 mg | ORAL_TABLET | Freq: Once | ORAL | Status: AC
  Administered 2016-11-01: 1000 mg via ORAL
  Filled 2016-11-01: qty 2

## 2016-11-01 MED ORDER — ACETAMINOPHEN 325 MG PO TABS
650.0000 mg | ORAL_TABLET | Freq: Four times a day (QID) | ORAL | Status: DC | PRN
  Administered 2016-11-02: 650 mg via ORAL
  Filled 2016-11-01: qty 2

## 2016-11-01 MED ORDER — NICOTINE 14 MG/24HR TD PT24
14.0000 mg | MEDICATED_PATCH | Freq: Every day | TRANSDERMAL | Status: DC
  Administered 2016-11-01 – 2016-11-03 (×3): 14 mg via TRANSDERMAL
  Filled 2016-11-01 (×3): qty 1

## 2016-11-01 MED ORDER — MORPHINE SULFATE (PF) 4 MG/ML IV SOLN
2.0000 mg | Freq: Once | INTRAVENOUS | Status: AC
Start: 2016-11-01 — End: 2016-11-01
  Administered 2016-11-01: 2 mg via INTRAVENOUS
  Filled 2016-11-01: qty 1

## 2016-11-01 MED ORDER — ONDANSETRON HCL 4 MG/2ML IJ SOLN: 4.0000 mg | Freq: Four times a day (QID) | INTRAMUSCULAR | Status: DC | PRN

## 2016-11-01 MED ORDER — AMLODIPINE BESYLATE 10 MG PO TABS
10.0000 mg | ORAL_TABLET | Freq: Every day | ORAL | Status: DC
  Administered 2016-11-01 – 2016-11-03 (×3): 10 mg via ORAL
  Filled 2016-11-01 (×3): qty 1

## 2016-11-01 MED ORDER — SODIUM CHLORIDE 0.9 % IV BOLUS (SEPSIS)
2000.0000 mL | Freq: Once | INTRAVENOUS | Status: AC
  Administered 2016-11-01: 2000 mL via INTRAVENOUS

## 2016-11-01 MED ORDER — KETOROLAC TROMETHAMINE 30 MG/ML IJ SOLN
30.0000 mg | Freq: Once | INTRAMUSCULAR | Status: AC
  Administered 2016-11-01: 30 mg via INTRAVENOUS
  Filled 2016-11-01: qty 1

## 2016-11-01 MED ORDER — IPRATROPIUM-ALBUTEROL 0.5-2.5 (3) MG/3ML IN SOLN: 3.0000 mL | RESPIRATORY_TRACT | Status: DC | PRN

## 2016-11-01 MED ORDER — METHYLPREDNISOLONE SODIUM SUCC 125 MG IJ SOLR
80.0000 mg | Freq: Once | INTRAMUSCULAR | Status: AC
  Administered 2016-11-01: 80 mg via INTRAVENOUS
  Filled 2016-11-01: qty 2

## 2016-11-01 MED ORDER — OXYCODONE-ACETAMINOPHEN 5-325 MG PO TABS
1.0000 | ORAL_TABLET | Freq: Three times a day (TID) | ORAL | Status: DC | PRN
  Administered 2016-11-01 – 2016-11-02 (×3): 1 via ORAL
  Filled 2016-11-01 (×5): qty 1

## 2016-11-01 MED ORDER — ASPIRIN 81 MG PO CHEW
324.0000 mg | CHEWABLE_TABLET | Freq: Once | ORAL | Status: AC
  Administered 2016-11-01: 324 mg via ORAL
  Filled 2016-11-01: qty 4

## 2016-11-01 MED ORDER — POTASSIUM CHLORIDE CRYS ER 20 MEQ PO TBCR
60.0000 meq | EXTENDED_RELEASE_TABLET | Freq: Once | ORAL | Status: AC
Start: 2016-11-01 — End: 2016-11-01
  Administered 2016-11-01: 60 meq via ORAL
  Filled 2016-11-01: qty 3

## 2016-11-01 MED ORDER — TAMSULOSIN HCL 0.4 MG PO CAPS
0.4000 mg | ORAL_CAPSULE | Freq: Every day | ORAL | Status: DC
  Administered 2016-11-01 – 2016-11-03 (×3): 0.4 mg via ORAL
  Filled 2016-11-01 (×3): qty 1

## 2016-11-01 MED ORDER — DEXTROSE 5 % IV SOLN
1.0000 g | INTRAVENOUS | Status: DC
  Administered 2016-11-01: 1 g via INTRAVENOUS
  Filled 2016-11-01 (×3): qty 10

## 2016-11-01 MED ORDER — PANTOPRAZOLE SODIUM 20 MG PO TBEC
20.0000 mg | DELAYED_RELEASE_TABLET | Freq: Every day | ORAL | Status: DC
  Administered 2016-11-01 – 2016-11-03 (×3): 20 mg via ORAL
  Filled 2016-11-01 (×3): qty 1

## 2016-11-01 MED ORDER — INSULIN ASPART 100 UNIT/ML ~~LOC~~ SOLN
0.0000 [IU] | Freq: Three times a day (TID) | SUBCUTANEOUS | Status: DC
  Administered 2016-11-01: 8 [IU] via SUBCUTANEOUS
  Administered 2016-11-01: 5 [IU] via SUBCUTANEOUS
  Administered 2016-11-02 – 2016-11-03 (×4): 3 [IU] via SUBCUTANEOUS

## 2016-11-01 MED ORDER — GABAPENTIN 100 MG PO CAPS
100.0000 mg | ORAL_CAPSULE | Freq: Three times a day (TID) | ORAL | Status: DC
  Administered 2016-11-01 – 2016-11-03 (×8): 100 mg via ORAL
  Filled 2016-11-01 (×8): qty 1

## 2016-11-01 MED ORDER — PRAVASTATIN SODIUM 20 MG PO TABS
20.0000 mg | ORAL_TABLET | Freq: Every day | ORAL | Status: DC
  Administered 2016-11-01 – 2016-11-02 (×2): 20 mg via ORAL
  Filled 2016-11-01 (×3): qty 1

## 2016-11-01 MED ORDER — ASPIRIN EC 81 MG PO TBEC
81.0000 mg | DELAYED_RELEASE_TABLET | Freq: Every day | ORAL | Status: DC
  Administered 2016-11-01 – 2016-11-03 (×3): 81 mg via ORAL
  Filled 2016-11-01 (×3): qty 1

## 2016-11-01 MED ORDER — ONDANSETRON HCL 4 MG PO TABS: 4.0000 mg | ORAL_TABLET | Freq: Four times a day (QID) | ORAL | Status: DC | PRN

## 2016-11-01 NOTE — H&P (Signed)
History and Physical    [name] [id number] DOB: 06-09-1951 DOA: 11/01/2016  PCP: Philis Fendt, MD   Patient coming from: Home  Chief Complaint: Fever, dizziness, body aches  HPI: [name] is a 65 y.o. gentleman with a history of HTN, DM, diastolic heart failure, prior CVA, pituitary adenoma, and prostatitis/UTI who presents to the ED complaining of fever, body aches, dizziness, nausea, chest pain, and shortness of breath, onset within the 24 hours prior to presentation.  He has had chills and sweats.  Chest pain is worse with movement or deep inspiration.  It is intermittent.  No cough.  No rash.  No vomiting.  No LOC.  His sister has been diagnosed with pneumonia and admitted to [facility]; otherwise, he denies known sick contacts.  He has already had his annual influenza vaccine this year.  He denies recent travel or new exposures.  No antibiotics or hospitalization in the past 30 days.  No recent travel or new exposures.  He has had dysuria.  ED Course: EKG shows sinus tachycardia; no acute ST segment changes.  Improved after 2L NS.  Chest xray negative for acute process.  WBC count 20.8.  Sodium 128.  Potassium 3.3.  Troponin 0.09.  He has received 2L NS, IV toradol, and potassium replacement.  Hospitalist asked to admit.  At this point, there is no unifying diagnosis.  Review of Systems: As per HPI otherwise 10 point review of systems negative.    Past Medical History:  Diagnosis Date  . Alcohol dependence ([place]) 12/10/2013  . Angioedema of lips 12/24/2013  . Brain tumor ([place])   . CHF (congestive heart failure) (HCC)    diastolic  . Chronic back pain   . Chronic headache   . CVA (cerebral vascular accident) ([place])   . Diabetes mellitus   . HTN (hypertension), malignant 09/06/2012  . Hypertension   . Narcotic abuse   . Normal cardiac stress test 2009  . TIA (transient ischemic attack) 09/06/2012  . Tobacco use disorder 12/10/2013  . Vertigo 09/06/2012    Past  Surgical History:  Procedure Laterality Date  . NO PAST SURGERIES       reports that he has been smoking Cigarettes.  He has been smoking about 0.50 packs per day. He uses smokeless tobacco. He reports that he drinks alcohol. He reports that he uses drugs, including Marijuana, Cocaine, and IV.  Presently, he denies EtOH or illicit drug use.  No Known Allergies  Family History  Problem Relation Age of Onset  . CAD Father     "heart attack in his 38's"  . Hypertension Father   . Diabetes Brother   . Diabetes Sister   . Hypertension Mother      Prior to Admission medications   Medication Sig Start Date End Date Taking? Authorizing Provider  albuterol (PROVENTIL HFA;VENTOLIN HFA) 108 (90 Base) MCG/ACT inhaler Inhale 1-2 puffs into the lungs every 6 (six) hours as needed for wheezing. 09/04/16  Yes Tanna Furry, MD  amLODipine (NORVASC) 10 MG tablet Take 10 mg by mouth daily.   Yes Historical Provider, MD  aspirin EC 81 MG tablet Take 81 mg by mouth daily.   Yes Historical Provider, MD  b complex vitamins tablet Take 1 tablet by mouth daily.   Yes Historical Provider, MD  cyclobenzaprine (FLEXERIL) 10 MG tablet Take 1 tablet (10 mg total) by mouth 3 (three) times daily as needed for muscle spasms. 05/21/16  Yes Dalia Heading, PA-C  gabapentin (NEURONTIN) 100 MG capsule Take 100 mg by mouth 3 (three) times daily. 06/10/16  Yes Historical Provider, MD  hydrALAZINE (APRESOLINE) 100 MG tablet Take 1 tablet (100 mg total) by mouth every 8 (eight) hours. 01/30/15  Yes Reyne Dumas, MD  HYDROcodone-acetaminophen (NORCO/VICODIN) 5-325 MG tablet Take 1 tablet by mouth every 6 (six) hours as needed for moderate pain. 05/21/16  Yes Christopher Lawyer, PA-C  hydrocortisone (ANUSOL-HC) 25 MG suppository Place 1 suppository (25 mg total) rectally 2 (two) times daily. 09/04/16  Yes Tanna Furry, MD  ibuprofen (ADVIL,MOTRIN) 800 MG tablet Take 800 mg by mouth every 8 (eight) hours as needed. pain 06/10/16  Yes  Historical Provider, MD  isosorbide dinitrate (ISORDIL) 10 MG tablet Take 1 tablet (10 mg total) by mouth 3 (three) times daily. 12/27/13  Yes Debbe Odea, MD  metFORMIN (GLUCOPHAGE) 500 MG tablet Take 500 mg by mouth 2 (two) times daily with a meal.   Yes Historical Provider, MD  naproxen (NAPROSYN) 375 MG tablet Take 1 tablet (375 mg total) by mouth 2 (two) times daily. 01/03/16  Yes Ankit Nanavati, MD  ondansetron (ZOFRAN) 4 MG tablet Take 1 tablet (4 mg total) by mouth every 6 (six) hours. Patient taking differently: Take 4 mg by mouth every 8 (eight) hours as needed for nausea.  03/20/16  Yes Montine Circle, PA-C  oxyCODONE-acetaminophen (PERCOCET/ROXICET) 5-325 MG tablet Take 1 tablet by mouth every 8 (eight) hours as needed for moderate pain.  06/10/16  Yes Historical Provider, MD  pantoprazole (PROTONIX) 20 MG tablet Take 1 tablet (20 mg total) by mouth daily. 07/02/15  Yes [name], MD  pravastatin (PRAVACHOL) 20 MG tablet Take 20 mg by mouth at bedtime.   Yes Historical Provider, MD  predniSONE (DELTASONE) 20 MG tablet Take 1 tablet (20 mg total) by mouth 2 (two) times daily with a meal. 09/04/16  Yes Tanna Furry, MD  tamsulosin (FLOMAX) 0.4 MG CAPS capsule Take 1 capsule (0.4 mg total) by mouth daily. 07/18/16  Yes Stevi Barrett, PA-C    Physical Exam: Vitals:   11/01/16 0415 11/01/16 0430 11/01/16 0445 11/01/16 0515  BP: 145/66 135/57 148/55 (!) 137/46  Pulse: 98 96 97 89  Resp: 17 15 14 16   Temp:      TempSrc:      SpO2: 98% 97% 99% 99%  Weight:      Height:          Constitutional: NAD but ill appearing Vitals:   11/01/16 0415 11/01/16 0430 11/01/16 0445 11/01/16 0515  BP: 145/66 135/57 148/55 (!) 137/46  Pulse: 98 96 97 89  Resp: 17 15 14 16   Temp:      TempSrc:      SpO2: 98% 97% 99% 99%  Weight:      Height:       Eyes: PERRL, lids and conjunctivae normal ENMT: Mucous membranes are moist. Posterior pharynx clear of any exudate or lesions. Normal dentition.    Neck: normal appearance, supple Respiratory: clear to auscultation bilaterally, no wheezing, no crackles. Normal respiratory effort. No accessory muscle use.  Cardiovascular: Normal rate, regular rhythm, no murmurs / rubs / gallops. No extremity edema. 2+ pedal pulses.  GI: abdomen is soft and compressible.  No distention.  No tenderness.  Bowel sounds are present. Musculoskeletal:  No joint deformity in upper and lower extremities. Good ROM, no contractures. Normal muscle tone.  Skin: no rashes, warm and dry Neurologic: No focal deficits Psychiatric: Normal judgment and insight. Alert and  oriented x 3. Normal mood.     Labs on Admission: I have personally reviewed following labs and imaging studies  CBC:  Recent Labs Lab 11/01/16 0327  WBC 20.8*  HGB 11.0*  HCT 34.3*  MCV 70.9*  PLT [id number]   Basic Metabolic Panel:  Recent Labs Lab 11/01/16 0327  NA 128*  K 3.3*  CL 95*  CO2 21*  GLUCOSE 183*  BUN 17  CREATININE 1.06  CALCIUM 8.7*   GFR: Estimated Creatinine Clearance: 70.4 mL/min (by C-G formula based on SCr of 1.06 mg/dL).  Cardiac Enzymes:  Recent Labs Lab 11/01/16 0327  TROPONINI 0.09*   Urine analysis: Pending  Sepsis Labs:  Lactic acid level pending  Radiological Exams on Admission: Dg Chest 2 View  Result Date: 11/01/2016 CLINICAL DATA:  Chest dating radiating to the upper extremity for 2 days EXAM: CHEST  2 VIEW COMPARISON:  09/04/2016 FINDINGS: The lungs are clear. The pulmonary vasculature is normal. Heart size is normal. Hilar and mediastinal contours are unremarkable. There is no pleural effusion. IMPRESSION: No active cardiopulmonary disease. Electronically Signed   By: Andreas Newport M.D.   On: 11/01/2016 03:49    EKG: Independently reviewed. Sinus tachycardia; no acute ST segment changes.  Assessment/Plan Principal Problem:   Fever Active Problems:   Diabetes mellitus type 2, controlled (HCC)   Tobacco use disorder   Leukocytosis    Essential hypertension   Hypokalemia   Chest pain   Chronic diastolic heart failure (HCC)   Nausea   Dysuria      SIRS/probable sepsis, etiology unclear but he admits to lower urinary tract symptoms --U/A and culture still pending (patient has not given a sample) --Empiric Rocephin --Blood cultures --Lactic acid pending --Flu screen pending --Hepatic function, TSH, coags  Chest pain, atypical.  Less suspicious for ACS in the context of other infectious symptoms.  Pericarditis is on the differential. --Serial troponin --Trial of anti-inflammatory --Complete echo today  Chronic diastolic heart failure --Compensate; will hold on additional IV fluids now.  Hypokalemia --Replacement ordered in the ED  HTN --Continue norvasc, hydralazine, isosorbide  DM --Hold metformin, SSI coverage AC/HS  Chronic pain --Percocet, neurontin  DVT prophylaxis: Low risk, outpatient status Code Status: FULL Family Communication: Patient alone in the ED at time of admission Disposition Plan: Exepct he will discharge to home when ready Consults called: NONE Admission status: Place in observation, telemetry   TIME SPENT: 70 minutes  Eber Jones MD [facility] Pager [phone]  If 7PM-7AM, please contact night-coverage [email or web address] Password [email or web address]  11/01/2016, 6:16 AM

## 2016-11-01 NOTE — Progress Notes (Signed)
Pt flu PCR negative - droplet precautions discontinued. 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  Pt educated and verbalized understanding.   Fritz Pickerel, RN

## 2016-11-01 NOTE — ED Triage Notes (Signed)
The pt is c/o dizziness for 2 days with nausea sometimes and chest pain sometimes also

## 2016-11-01 NOTE — Progress Notes (Signed)
Pharmacy Antibiotic Note  [name] is a 65 y.o. male admitted on 11/01/2016 with suspected UTI.  Pharmacy has been consulted for ceftriaxone dosing. Patient with complaints of dysuria, WBC elevated at 20.8, and lactate 1.2.   Plan: Ceftriaxone 1g IV q24hr  Follow up cultures and sensitivities Pharmacy to sign-off, no renal adjustment needed   Height: 5\' 9"  (175.3 cm) Weight: 160 lb (72.6 kg) IBW/kg (Calculated) : 70.7  Temp (24hrs), Avg:100.1 F (37.8 C), Min:98.6 F (37 C), Max:101.1 F (38.4 C)   Recent Labs Lab 11/01/16 0327 11/01/16 0536  WBC 20.8*  --   CREATININE 1.06  --   LATICACIDVEN  --  1.2    Estimated Creatinine Clearance: 70.4 mL/min (by C-G formula based on SCr of 1.06 mg/dL).    No Known Allergies  Antimicrobials this admission: 11/4 CTX >>   Dose adjustments this admission: n/a  Microbiology results: pending   Thank you for allowing pharmacy to be a part of this patient's care.  Argie Ramming, PharmD Pharmacy Resident  Pager [phone] 11/01/16 8:20 AM

## 2016-11-01 NOTE — ED Notes (Signed)
Per night shift RN, report has been given to 2W RN.

## 2016-11-01 NOTE — ED Provider Notes (Signed)
[facility] DEPT Provider Note   CSN: [id number] Arrival date & time: 11/01/16  0306     History   Chief Complaint Chief Complaint  Patient presents with  . Dizziness    HPI [name] is a 65 y.o. male PMH of alcohol and narcotic abuse, CHF, HTN, HLD, here for fever, diffuse body aches and chest pain.  Pain is left sided and sharp.  It is worse with any movement.  He has SOB, no vomiting or diaphoresis.  This has been going on for 2 days. He has not taken anything for this during the interval. He has sick contacts in his sister who is a dialysis patient and has had viral URI like symptoms recently.  There are no further complaints.  10 Systems reviewed and are negative for acute change except as noted in the HPI.   HPI  Past Medical History:  Diagnosis Date  . Alcohol dependence ([place]) 12/10/2013  . Angioedema of lips 12/24/2013  . Brain tumor ([place])   . CHF (congestive heart failure) (HCC)    diastolic  . Chronic back pain   . Chronic headache   . CVA (cerebral vascular accident) ([place])   . Diabetes mellitus   . HTN (hypertension), malignant 09/06/2012  . Hypertension   . Narcotic abuse   . Normal cardiac stress test 2009  . TIA (transient ischemic attack) 09/06/2012  . Tobacco use disorder 12/10/2013  . Vertigo 09/06/2012    Patient Active Problem List   Diagnosis Date Noted  . Nausea 11/01/2016  . Dysuria 11/01/2016  . Chronic diastolic heart failure ([place]) 03/20/2015  . Chest pain 03/18/2015  . History of TIA (transient ischemic attack) 01/27/2015  . Enterococcus UTI 01/27/2015  . Cough 01/27/2015  . Bronchitis 01/27/2015  . HCAP (healthcare-associated pneumonia)   . CHF (congestive heart failure) ([place]) 01/26/2015  . History of alcohol abuse 01/26/2015  . Pyelonephritis 01/12/2015  . Prostatitis 01/12/2015  . Essential hypertension 11/03/2014  . Hypokalemia 11/03/2014  . Fever 08/15/2014  . Chronic pain 12/24/2013  . Tobacco use disorder 12/10/2013  .  Leukocytosis 12/10/2013  . Diabetes mellitus type 2, controlled ([place]) 09/06/2012  . Pain in the side 09/06/2012  . Pituitary adenoma ([place]) 09/06/2012    Past Surgical History:  Procedure Laterality Date  . NO PAST SURGERIES         Home Medications    Prior to Admission medications   Medication Sig Start Date End Date Taking? Authorizing Provider  albuterol (PROVENTIL HFA;VENTOLIN HFA) 108 (90 Base) MCG/ACT inhaler Inhale 1-2 puffs into the lungs every 6 (six) hours as needed for wheezing. 09/04/16  Yes Tanna Furry, MD  amLODipine (NORVASC) 10 MG tablet Take 10 mg by mouth daily.   Yes Historical Provider, MD  aspirin EC 81 MG tablet Take 81 mg by mouth daily.   Yes Historical Provider, MD  b complex vitamins tablet Take 1 tablet by mouth daily.   Yes Historical Provider, MD  cyclobenzaprine (FLEXERIL) 10 MG tablet Take 1 tablet (10 mg total) by mouth 3 (three) times daily as needed for muscle spasms. 05/21/16  Yes Christopher Lawyer, PA-C  gabapentin (NEURONTIN) 100 MG capsule Take 100 mg by mouth 3 (three) times daily. 06/10/16  Yes Historical Provider, MD  hydrALAZINE (APRESOLINE) 100 MG tablet Take 1 tablet (100 mg total) by mouth every 8 (eight) hours. 01/30/15  Yes Reyne Dumas, MD  HYDROcodone-acetaminophen (NORCO/VICODIN) 5-325 MG tablet Take 1 tablet by mouth every 6 (six) hours as needed  for moderate pain. 05/21/16  Yes Christopher Lawyer, PA-C  hydrocortisone (ANUSOL-HC) 25 MG suppository Place 1 suppository (25 mg total) rectally 2 (two) times daily. 09/04/16  Yes Tanna Furry, MD  ibuprofen (ADVIL,MOTRIN) 800 MG tablet Take 800 mg by mouth every 8 (eight) hours as needed. pain 06/10/16  Yes Historical Provider, MD  isosorbide dinitrate (ISORDIL) 10 MG tablet Take 1 tablet (10 mg total) by mouth 3 (three) times daily. 12/27/13  Yes Debbe Odea, MD  metFORMIN (GLUCOPHAGE) 500 MG tablet Take 500 mg by mouth 2 (two) times daily with a meal.   Yes Historical Provider, MD  naproxen  (NAPROSYN) 375 MG tablet Take 1 tablet (375 mg total) by mouth 2 (two) times daily. 01/03/16  Yes Ankit Nanavati, MD  ondansetron (ZOFRAN) 4 MG tablet Take 1 tablet (4 mg total) by mouth every 6 (six) hours. Patient taking differently: Take 4 mg by mouth every 8 (eight) hours as needed for nausea.  03/20/16  Yes Montine Circle, PA-C  oxyCODONE-acetaminophen (PERCOCET/ROXICET) 5-325 MG tablet Take 1 tablet by mouth every 8 (eight) hours as needed for moderate pain.  06/10/16  Yes Historical Provider, MD  pantoprazole (PROTONIX) 20 MG tablet Take 1 tablet (20 mg total) by mouth daily. 07/02/15  Yes [name], MD  pravastatin (PRAVACHOL) 20 MG tablet Take 20 mg by mouth at bedtime.   Yes Historical Provider, MD  predniSONE (DELTASONE) 20 MG tablet Take 1 tablet (20 mg total) by mouth 2 (two) times daily with a meal. 09/04/16  Yes Tanna Furry, MD  tamsulosin (FLOMAX) 0.4 MG CAPS capsule Take 1 capsule (0.4 mg total) by mouth daily. 07/18/16  Yes Stevi Barrett, PA-C  doxycycline (VIBRAMYCIN) 100 MG capsule Take 1 capsule (100 mg total) by mouth 2 (two) times daily. Patient not taking: Reported on 11/01/2016 09/04/16   Tanna Furry, MD  tamsulosin (FLOMAX) 0.4 MG CAPS capsule Take 1 capsule (0.4 mg total) by mouth daily. Patient not taking: Reported on 11/01/2016 07/05/16   Ozella Almond Ward, PA-C    Family History Family History  Problem Relation Age of Onset  . CAD Father     "heart attack in his 20's"  . Hypertension Father   . Diabetes Brother   . Diabetes Sister   . Hypertension Mother     Social History Social History  Substance Use Topics  . Smoking status: Current Every Day Smoker    Packs/day: 0.50    Types: Cigarettes  . Smokeless tobacco: Current User  . Alcohol use Yes     Allergies   Review of patient's allergies indicates no known allergies.   Review of Systems Review of Systems   Physical Exam Updated Vital Signs BP (!) 137/46   Pulse 89   Temp 100.5 F (38.1 C)  (Oral)   Resp 16   Ht 5\' 9"  (1.753 m)   Wt 160 lb (72.6 kg)   SpO2 99%   BMI 23.63 kg/m   Physical Exam  Constitutional: He is oriented to person, place, and time. Vital signs are normal. He appears well-developed and well-nourished.  Non-toxic appearance. He does not appear ill. No distress.  HENT:  Head: Normocephalic and atraumatic.  Nose: Nose normal.  Mouth/Throat: Oropharynx is clear and moist. No oropharyngeal exudate.  Eyes: Conjunctivae and EOM are normal. Pupils are equal, round, and reactive to light. No scleral icterus.  Neck: Normal range of motion. Neck supple. No tracheal deviation, no edema, no erythema and normal range of motion present. No thyroid  mass and no thyromegaly present.  Cardiovascular: Regular rhythm, S1 normal, S2 normal, normal heart sounds, intact distal pulses and normal pulses.  Exam reveals no gallop and no friction rub.   No murmur heard. tachycardia  Pulmonary/Chest: Effort normal and breath sounds normal. No respiratory distress. He has no wheezes. He has no rhonchi. He has no rales.  Abdominal: Soft. Normal appearance and bowel sounds are normal. He exhibits no distension, no ascites and no mass. There is no hepatosplenomegaly. There is no tenderness. There is no rebound, no guarding and no CVA tenderness.  Musculoskeletal: Normal range of motion. He exhibits no edema or tenderness.  Lymphadenopathy:    He has no cervical adenopathy.  Neurological: He is alert and oriented to person, place, and time. He has normal strength. No cranial nerve deficit or sensory deficit.  Skin: Skin is warm, dry and intact. No petechiae and no rash noted. He is not diaphoretic. No erythema. No pallor.  Tactile fever  Nursing note and vitals reviewed.    ED Treatments / Results  Labs (all labs ordered are listed, but only abnormal results are displayed) Labs Reviewed  BASIC METABOLIC PANEL - Abnormal; Notable for the following:       Result Value   Sodium 128  (*)    Potassium 3.3 (*)    Chloride 95 (*)    CO2 21 (*)    Glucose, Bld 183 (*)    Calcium 8.7 (*)    All other components within normal limits  CBC - Abnormal; Notable for the following:    WBC 20.8 (*)    Hemoglobin 11.0 (*)    HCT 34.3 (*)    MCV 70.9 (*)    MCH 22.7 (*)    All other components within normal limits  TROPONIN I - Abnormal; Notable for the following:    Troponin I 0.09 (*)    All other components within normal limits  URINE CULTURE  CULTURE, BLOOD (ROUTINE X 2)  CULTURE, BLOOD (ROUTINE X 2)  RESPIRATORY PANEL BY PCR  URINALYSIS, ROUTINE W REFLEX MICROSCOPIC (NOT AT [facility])  INFLUENZA PANEL BY PCR (TYPE A & B, H1N1)  LACTIC ACID, PLASMA  HEPATIC FUNCTION PANEL    EKG  EKG Interpretation  Date/Time:  Saturday November 01 2016 03:11:26 EDT Ventricular Rate:  114 PR Interval:  150 QRS Duration: 84 QT Interval:  354 QTC Calculation: 487 R Axis:   6 Text Interpretation:  Sinus tachycardia Biatrial enlargement Anteroseptal infarct , age undetermined Abnormal ECG HR has now increased Confirmed by Glynn Octave [id number]) on 11/01/2016 4:31:25 AM       Radiology Dg Chest 2 View  Result Date: 11/01/2016 CLINICAL DATA:  Chest dating radiating to the upper extremity for 2 days EXAM: CHEST  2 VIEW COMPARISON:  09/04/2016 FINDINGS: The lungs are clear. The pulmonary vasculature is normal. Heart size is normal. Hilar and mediastinal contours are unremarkable. There is no pleural effusion. IMPRESSION: No active cardiopulmonary disease. Electronically Signed   By: Andreas Newport M.D.   On: 11/01/2016 03:49    Procedures Procedures (including critical care time)  Medications Ordered in ED Medications  sodium chloride 0.9 % bolus 2,000 mL (2,000 mLs Intravenous New Bag/Given 11/01/16 0456)  ketorolac (TORADOL) 30 MG/ML injection 30 mg (30 mg Intravenous Given 11/01/16 0451)  acetaminophen (TYLENOL) tablet 1,000 mg (1,000 mg Oral Given 11/01/16 0450)    potassium chloride SA (K-DUR,KLOR-CON) CR tablet 60 mEq (60 mEq Oral Given 11/01/16 0458)  methylPREDNISolone  sodium succinate (SOLU-MEDROL) 125 mg/2 mL injection 80 mg (80 mg Intravenous Given 11/01/16 0602)  morphine 4 MG/ML injection 2 mg (2 mg Intravenous Given 11/01/16 0601)  aspirin chewable tablet 324 mg (324 mg Oral Given 11/01/16 0602)     Initial Impression / Assessment and Plan / ED Course  I have reviewed the triage vital signs and the nursing notes.  Pertinent labs & imaging results that were available during my care of the patient were reviewed by me and considered in my medical decision making (see chart for details).  Clinical Course    Patient presents to the ED for fever and chest pain.  CXR neg for pneumonia.  Troponin is elevated at 0.09 but history is not consistent with ACS.  I spoke with cardiology who agrees this is more of a septic picture. May be pericarditis as well.  They advised for hospitalist admission and to re-consult in the morning if further assistance is needed or if troponin continues to rise.  I spoke with hospitalist who agrees with the plan and will admit the patient.  They are requesting viral resp panel.  Final Clinical Impressions(s) / ED Diagnoses   Final diagnoses:  Flu-like symptoms  NSTEMI (non-ST elevated myocardial infarction) [facility])    New Prescriptions New Prescriptions   No medications on file     Everlene Balls, MD 11/01/16 [phone]

## 2016-11-01 NOTE — Progress Notes (Signed)
PROGRESS NOTE    [name]  [id number] DOB: Feb 01, 1951 DOA: 11/01/2016 PCP: Philis Fendt, MD   Chief Complaint  Patient presents with  . Dizziness    Brief Narrative:  HPI on 11/01/2016 by Dr. Lily Kocher [name] is a 65 y.o. gentleman with a history of HTN, DM, diastolic heart failure, prior CVA, pituitary adenoma, and prostatitis/UTI who presents to the ED complaining of fever, body aches, dizziness, nausea, chest pain, and shortness of breath, onset within the 24 hours prior to presentation.  He has had chills and sweats.  Chest pain is worse with movement or deep inspiration.  It is intermittent.  No cough.  No rash.  No vomiting.  No LOC.  His sister has been diagnosed with pneumonia and admitted to [facility]; otherwise, he denies known sick contacts.  He has already had his annual influenza vaccine this year.  He denies recent travel or new exposures.  No antibiotics or hospitalization in the past 30 days.  No recent travel or new exposures. He has had dysuria.  Assessment & Plan   Sepsis secondary to UTI -Upon admission, patient was febrile with leukocytosis -UA showed TNTC WBC, many bacteria, large leukocytes -Blood and urine cultures pending -Influenza PCR negative -Chest x-ray unremarkable -Continue ceftriaxone  Atypical chest pain/Elevated troponin -Patient currently chest pain-free -Troponin peaked at 0.09, currently 0.04 -Will continue to monitor closely -Echocardiogram pending -Continue aspirin, statin  Chronic diastolic heart failure -Echocardiogram 03/19/2015 showed an EF of 123456, grade 1 diastolic dysfunction -Currently appears to be euvolemic and compensated -BNP 215  Hypokalemia -Will continue to replace and monitor BMP  Essential hypertension -Continue amlodipine, hydralazine, Isordil  Diabetes mellitus, type II -metformin held -Continue ISS and CBG monitoring  Chronic pain -Continue pain control when necessary  GERD -Continue  PPI  Tobacco abuse -Smoking cessation discussed -will order nicotine patch  DVT Prophylaxis  Lovenox  Code Status: Full  Family Communication: None at bedside  Disposition Plan: Observation  Consultants None  Procedures  None  Antibiotics   Anti-infectives    Start     Dose/Rate Route Frequency Ordered Stop   11/01/16 0900  cefTRIAXone (ROCEPHIN) 1 g in dextrose 5 % 50 mL IVPB     1 g 100 mL/hr over 30 Minutes Intravenous Every 24 hours 11/01/16 0818        Subjective:   Charlestine Massed Mula seen and examined today.  Patient states he is feeling better. Currently denies chest pain or shortness of breath. Denies any abdominal pain, nausea or vomiting, diarrhea or constipation, headache or dizziness..    Objective:   Vitals:   11/01/16 0430 11/01/16 0445 11/01/16 0515 11/01/16 0805  BP: 135/57 148/55 (!) 137/46 (!) 132/41  Pulse: 96 97 89 72  Resp: 15 14 16 18   Temp:    98.6 F (37 C)  TempSrc:    Oral  SpO2: 97% 99% 99% 98%  Weight:      Height:        Intake/Output Summary (Last 24 hours) at 11/01/16 1250 Last data filed at 11/01/16 0900  Gross per 24 hour  Intake              120 ml  Output                0 ml  Net              120 ml   Filed Weights   11/01/16 0314  Weight: 72.6 kg (  160 lb)    Exam  General: Well developed, well nourished, NAD, appears stated age  HEENT: NCAT,  mucous membranes moist.   Cardiovascular: S1 S2 auscultated, no rubs, murmurs or gallops. Regular rate and rhythm.  Respiratory: Clear to auscultation bilaterally with equal chest rise  Abdomen: Soft, nontender, nondistended, + bowel sounds  Extremities: warm dry without cyanosis clubbing or edema  Neuro: AAOx3, nonfocal  Psych: Normal affect and demeanor with intact judgement and insight   Data Reviewed: I have personally reviewed following labs and imaging studies  CBC:  Recent Labs Lab 11/01/16 0327  WBC 20.8*  HGB 11.0*  HCT 34.3*  MCV 70.9*  PLT [id number]    Basic Metabolic Panel:  Recent Labs Lab 11/01/16 0327  NA 128*  K 3.3*  CL 95*  CO2 21*  GLUCOSE 183*  BUN 17  CREATININE 1.06  CALCIUM 8.7*   GFR: Estimated Creatinine Clearance: 70.4 mL/min (by C-G formula based on SCr of 1.06 mg/dL). Liver Function Tests:  Recent Labs Lab 11/01/16 0536  AST 30  ALT 21  ALKPHOS 64  BILITOT 0.7  PROT 8.4*  ALBUMIN 3.2*   No results for input(s): LIPASE, AMYLASE in the last 168 hours. No results for input(s): AMMONIA in the last 168 hours. Coagulation Profile:  Recent Labs Lab 11/01/16 0859  INR 1.08   Cardiac Enzymes:  Recent Labs Lab 11/01/16 0327 11/01/16 0859  TROPONINI 0.09* 0.04*   BNP (last 3 results) No results for input(s): PROBNP in the last 8760 hours. HbA1C: No results for input(s): HGBA1C in the last 72 hours. CBG:  Recent Labs Lab 11/01/16 0813 11/01/16 1133  GLUCAP 137* 294*   Lipid Profile: No results for input(s): CHOL, HDL, LDLCALC, TRIG, CHOLHDL, LDLDIRECT in the last 72 hours. Thyroid Function Tests: No results for input(s): TSH, T4TOTAL, FREET4, T3FREE, THYROIDAB in the last 72 hours. Anemia Panel: No results for input(s): VITAMINB12, FOLATE, FERRITIN, TIBC, IRON, RETICCTPCT in the last 72 hours. Urine analysis:    Component Value Date/Time   COLORURINE YELLOW 11/01/2016 0739   APPEARANCEUR TURBID (A) 11/01/2016 0739   LABSPEC 1.011 11/01/2016 0739   PHURINE 7.5 11/01/2016 0739   GLUCOSEU NEGATIVE 11/01/2016 0739   HGBUR TRACE (A) 11/01/2016 0739   BILIRUBINUR NEGATIVE 11/01/2016 0739   KETONESUR NEGATIVE 11/01/2016 0739   PROTEINUR NEGATIVE 11/01/2016 0739   UROBILINOGEN 2.0 (H) 08/06/2015 1209   NITRITE NEGATIVE 11/01/2016 0739   LEUKOCYTESUR LARGE (A) 11/01/2016 0739   Sepsis Labs: @LABRCNTIP (procalcitonin:4,lacticidven:4)  )No results found for this or any previous visit (from the past 240 hour(s)).    Radiology Studies: Dg Chest 2 View  Result Date:  11/01/2016 CLINICAL DATA:  Chest dating radiating to the upper extremity for 2 days EXAM: CHEST  2 VIEW COMPARISON:  09/04/2016 FINDINGS: The lungs are clear. The pulmonary vasculature is normal. Heart size is normal. Hilar and mediastinal contours are unremarkable. There is no pleural effusion. IMPRESSION: No active cardiopulmonary disease. Electronically Signed   By: Andreas Newport M.D.   On: 11/01/2016 03:49     Scheduled Meds: . amLODipine  10 mg Oral Daily  . aspirin EC  81 mg Oral Daily  . cefTRIAXone (ROCEPHIN)  IV  1 g Intravenous Q24H  . feeding supplement (ENSURE ENLIVE)  237 mL Oral BID BM  . gabapentin  100 mg Oral TID  . hydrALAZINE  100 mg Oral Q8H  . insulin aspart  0-15 Units Subcutaneous TID WC  . insulin aspart  0-5 Units  Subcutaneous QHS  . isosorbide dinitrate  10 mg Oral TID  . ketorolac  15 mg Intravenous Q6H  . nicotine  14 mg Transdermal Daily  . pantoprazole  20 mg Oral Daily  . pravastatin  20 mg Oral QHS  . sodium chloride flush  3 mL Intravenous Q12H  . tamsulosin  0.4 mg Oral Daily   Continuous Infusions:    LOS: 1 day   Time Spent in minutes   30 minutes  Falynn Ailey D.O. on 11/01/2016 at 12:50 PM  Between 7am to 7pm - Pager - [phone]  After 7pm go to [email or web address] - password [email or web address]  And look for the night coverage person covering for me after hours  [facility] Office  [phone]

## 2016-11-02 ENCOUNTER — Other Ambulatory Visit: Payer: Self-pay

## 2016-11-02 DIAGNOSIS — A419 Sepsis, unspecified organism: Secondary | ICD-10-CM

## 2016-11-02 DIAGNOSIS — D72829 Elevated white blood cell count, unspecified: Secondary | ICD-10-CM

## 2016-11-02 DIAGNOSIS — I10 Essential (primary) hypertension: Secondary | ICD-10-CM

## 2016-11-02 DIAGNOSIS — E876 Hypokalemia: Secondary | ICD-10-CM

## 2016-11-02 DIAGNOSIS — E118 Type 2 diabetes mellitus with unspecified complications: Secondary | ICD-10-CM

## 2016-11-02 DIAGNOSIS — I5032 Chronic diastolic (congestive) heart failure: Secondary | ICD-10-CM

## 2016-11-02 DIAGNOSIS — R072 Precordial pain: Secondary | ICD-10-CM

## 2016-11-02 DIAGNOSIS — R300 Dysuria: Secondary | ICD-10-CM

## 2016-11-02 DIAGNOSIS — F17200 Nicotine dependence, unspecified, uncomplicated: Secondary | ICD-10-CM

## 2016-11-02 LAB — CBC
HEMATOCRIT: 32.9 % — AB (ref 39.0–52.0)
HEMATOCRIT: 32.1 % — AB (ref 39.0–52.0)
HEMOGLOBIN: 10.6 g/dL — AB (ref 13.0–17.0)
HEMOGLOBIN: 10.2 g/dL — AB (ref 13.0–17.0)
MCH: 22.8 pg — AB (ref 26.0–34.0)
MCH: 22.5 pg — ABNORMAL LOW (ref 26.0–34.0)
MCHC: 32.2 g/dL (ref 30.0–36.0)
MCHC: 31.8 g/dL (ref 30.0–36.0)
MCV: 70.8 fL — AB (ref 78.0–100.0)
MCV: 70.7 fL — ABNORMAL LOW (ref 78.0–100.0)
Platelets: 196 10*3/uL (ref 150–400)
Platelets: 213 10*3/uL (ref 150–400)
RBC: 4.65 MIL/uL (ref 4.22–5.81)
RBC: 4.54 MIL/uL (ref 4.22–5.81)
RDW: 15.3 % (ref 11.5–15.5)
RDW: 15.1 % (ref 11.5–15.5)
WBC: 30.4 10*3/uL — ABNORMAL HIGH (ref 4.0–10.5)
WBC: 34 10*3/uL — ABNORMAL HIGH (ref 4.0–10.5)

## 2016-11-02 LAB — HEMOGLOBIN A1C
Hgb A1c MFr Bld: 6.2 % — ABNORMAL HIGH (ref 4.8–5.6)
Mean Plasma Glucose: 131 mg/dL

## 2016-11-02 LAB — GLUCOSE, CAPILLARY
GLUCOSE-CAPILLARY: 159 mg/dL — AB (ref 65–99)
GLUCOSE-CAPILLARY: 176 mg/dL — AB (ref 65–99)
GLUCOSE-CAPILLARY: 165 mg/dL — AB (ref 65–99)
Glucose-Capillary: 105 mg/dL — ABNORMAL HIGH (ref 65–99)

## 2016-11-02 LAB — BASIC METABOLIC PANEL
Anion gap: 6 (ref 5–15)
BUN: 25 mg/dL — AB (ref 6–20)
CHLORIDE: 110 mmol/L (ref 101–111)
CO2: 24 mmol/L (ref 22–32)
Calcium: 8.9 mg/dL (ref 8.9–10.3)
Creatinine, Ser: 0.97 mg/dL (ref 0.61–1.24)
GFR calc Af Amer: >60 mL/min (ref >60)
GFR calc non Af Amer: >60 mL/min (ref >60)
GLUCOSE: 139 mg/dL — AB (ref 65–99)
POTASSIUM: 4 mmol/L (ref 3.5–5.1)
SODIUM: 140 mmol/L (ref 135–145)

## 2016-11-02 LAB — TROPONIN I: Troponin I: 0.07 ng/mL (ref <0.03)

## 2016-11-02 MED ORDER — LINEZOLID 600 MG/300ML IV SOLN
600.0000 mg | Freq: Two times a day (BID) | INTRAVENOUS | Status: DC
  Administered 2016-11-02 – 2016-11-03 (×3): 600 mg via INTRAVENOUS
  Filled 2016-11-02 (×4): qty 300

## 2016-11-02 NOTE — Progress Notes (Signed)
Patient having chills and shivering temp 100.7 R.N. Into assess.2Tylenol p.o. Given.

## 2016-11-02 NOTE — Consult Note (Signed)
Cardiology Consult    Patient ID: [name] MRN: [id number], DOB/AGE: 06/17/1951   Admit date: 11/01/2016 Date of Consult: 11/02/2016  Primary Physician: Philis Fendt, MD Reason for Consult: Chest Pain Primary Cardiologist: Seen by Dr. [name] in 11/2013 Requesting Provider: Dr. Ree Kida   History of Present Illness    [name] is a 64 y.o. male with past medical history of chronic diastolic CHF (EF 123456 by echo in 02/2015), HTN, Type 2 DM, and prior CVA who presented to [facility] ED on 11/01/2016 for evaluation of fever, chest pain, and diffuse body aches.   Initial labs showed a WBC of 20.8, Hgb 11.0, platelets 222. Na+ 128. K+ 3.3. Creatinine 1.06. Lactic Acid 1.2.  Influenza Panel negative. UA with large leukocytes and culture showing gram negative rods. BNP 215. Cyclic troponin values have been 0.04, 0.19, 0.05, 0.10, and 0.07. CXR with noa cite cardiopulmonary disease. EKG shows sinus tachycardia, HR 114, with borderline LVH and up-sloping ST in leads V2-V3 (similar to previous tracings).   He is currently being treated for Urosepsis by the admitting team. Cardiology is asked to see today for evaluation of chest pain and elevated troponin values.  [name] reports developing chest discomfort 3 days ago, saying this occurs at rest and with exertion. Has been constant since onset but says it varies in intensity. Worse with deep inspirations. Diffusely tender to palpation. No associated nausea, vomiting, or diaphoresis.   Denies any prior cardiac history. No known MI's or cardiac arrhythmias. He does have a family history of premature CAD with his father having an MI in his 1's. The patient has a history of substance use including Cocaine and Marijuana use but denies any recent use. Does smoke 1 ppd and has done so for 40+ years. Denies any excessive alcohol use.    Past Medical History   Past Medical History:  Diagnosis Date  . Alcohol dependence ([place])  12/10/2013  . Angioedema of lips 12/24/2013  . Brain tumor ([place])   . CHF (congestive heart failure) ([facility])    diastolic  . Chronic back pain   . Chronic headache   . CVA (cerebral vascular accident) ([place])   . Diabetes mellitus   . HTN (hypertension), malignant 09/06/2012  . Hypertension   . Narcotic abuse   . Normal cardiac stress test 2009  . TIA (transient ischemic attack) 09/06/2012  . Tobacco use disorder 12/10/2013  . Vertigo 09/06/2012    Past Surgical History:  Procedure Laterality Date  . NO PAST SURGERIES       Allergies  No Known Allergies  Inpatient Medications    . amLODipine  10 mg Oral Daily  . aspirin EC  81 mg Oral Daily  . cefTRIAXone (ROCEPHIN)  IV  1 g Intravenous Q24H  . enoxaparin (LOVENOX) injection  40 mg Subcutaneous Q24H  . feeding supplement (ENSURE ENLIVE)  237 mL Oral BID BM  . gabapentin  100 mg Oral TID  . hydrALAZINE  100 mg Oral Q8H  . insulin aspart  0-15 Units Subcutaneous TID WC  . insulin aspart  0-5 Units Subcutaneous QHS  . isosorbide dinitrate  10 mg Oral TID  . nicotine  14 mg Transdermal Daily  . pantoprazole  20 mg Oral Daily  . pravastatin  20 mg Oral QHS  . sodium chloride flush  3 mL Intravenous Q12H  . tamsulosin  0.4 mg Oral Daily    Family History    Family History  Problem Relation Age of Onset  . CAD Father     "heart attack in his 97's"  . Hypertension Father   . Diabetes Brother   . Diabetes Sister   . Hypertension Mother     Social History    Social History   Social History  . Marital status: Legally Separated    Spouse name: N/A  . Number of children: N/A  . Years of education: N/A   Occupational History  . Not on file.   Social History Main Topics  . Smoking status: Current Every Day Smoker    Packs/day: 0.50    Types: Cigarettes  . Smokeless tobacco: Current User  . Alcohol use Yes  . Drug use:     Types: Marijuana, Cocaine, IV     Comment: Heroin - one month ago  . Sexual activity: Not on  file   Other Topics Concern  . Not on file   Social History Narrative   Single.  Lives alone.       Review of Systems    General:  No night sweats or weight changes. Positive for fever and chills. Cardiovascular:  No dyspnea on exertion, edema, orthopnea, palpitations, paroxysmal nocturnal dyspnea. Positive for chest pain.  Dermatological: No rash, lesions/masses Respiratory: No cough, dyspnea Urologic: No hematuria, dysuria Abdominal:   No nausea, vomiting, diarrhea, bright red blood per rectum, melena, or hematemesis Neurologic:  No visual changes, wkns, changes in mental status. All other systems reviewed and are otherwise negative except as noted above.  Physical Exam    Blood pressure (!) 134/42, pulse 67, temperature 98.2 F (36.8 C), temperature source Oral, resp. rate 18, height 5\' 9"  (1.753 m), weight 159 lb 11.2 oz (72.4 kg), SpO2 98 %.  General: Pleasant, African American male appearing in NAD Psych: Normal affect. Neuro: Alert and oriented X 3. Moves all extremities spontaneously. HEENT: Normal  Neck: Supple without bruits or JVD. Lungs:  Resp regular and unlabored, CTA without wheezing or rales. Heart: RRR no s3, s4, or murmurs. Abdomen: Soft, non-tender, non-distended, BS + x 4.  Extremities: No clubbing, cyanosis or edema. DP/PT/Radials 2+ and equal bilaterally.  Labs    Troponin (Point of Care Test) No results for input(s): TROPIPOC in the last 72 hours.  Recent Labs  11/01/16 1316 11/01/16 1559 11/01/16 2158 11/02/16 0305  TROPONINI 0.19* 0.05* 0.10* 0.07*   Lab Results  Component Value Date   WBC 34.0 (H) 11/02/2016   HGB 10.2 (L) 11/02/2016   HCT 32.1 (L) 11/02/2016   MCV 70.7 (L) 11/02/2016   PLT 213 11/02/2016    Recent Labs Lab 11/01/16 0536 11/02/16 0305  NA  --  140  K  --  4.0  CL  --  110  CO2  --  24  BUN  --  25*  CREATININE  --  0.97  CALCIUM  --  8.9  PROT 8.4*  --   BILITOT 0.7  --   ALKPHOS 64  --   ALT 21  --   AST  30  --   GLUCOSE  --  139*   Lab Results  Component Value Date   CHOL 128 08/16/2014   HDL 30 (L) 08/16/2014   LDLCALC 79 08/16/2014   TRIG 94 08/16/2014   Lab Results  Component Value Date   DDIMER 0.44 03/18/2015     Radiology Studies    Dg Chest 2 View  Result Date: 11/01/2016 CLINICAL DATA:  Chest dating radiating to the  upper extremity for 2 days EXAM: CHEST  2 VIEW COMPARISON:  09/04/2016 FINDINGS: The lungs are clear. The pulmonary vasculature is normal. Heart size is normal. Hilar and mediastinal contours are unremarkable. There is no pleural effusion. IMPRESSION: No active cardiopulmonary disease. Electronically Signed   By: Andreas Newport M.D.   On: 11/01/2016 03:49    EKG & Cardiac Imaging    EKG: Sinus tachycardia, HR 114, with borderline LVH and up-sloping ST in leads V2-V3 (similar to previous tracings)  Echocardiogram: 02/2015 Study Conclusions  - Left ventricle: The cavity size was normal. Wall thickness was increased in a pattern of mild LVH. Systolic function was normal. The estimated ejection fraction was in the range of 60% to 65%. There was dynamic obstruction. Wall motion was normal; there were no regional wall motion abnormalities. Doppler parameters are consistent with abnormal left ventricular relaxation (grade 1 diastolic dysfunction). - Aortic valve: There was trivial regurgitation. Valve area (VTI): 2.95 cm^2. Valve area (Vmax): 2.81 cm^2. Valve area (Vmean): 2.95 cm^2. - Mitral valve: There was mild regurgitation.  Assessment & Plan    1. Pleuritic Chest Pain - presented with fever, chest pain, and diffuse body aches. Reported developing chest pain 3 days ago which has been constant. Present with rest and exertion. No variance in symptoms with positional changes.  - reports his pain is exacerbated with deep inspiration. Pain is reproducible on examination.  - cyclic troponin values have been 0.04, 0.19, 0.05, 0.10, and  0.07. EKG shows sinus tachycardia, HR 114, with borderline LVH and up-sloping ST in leads V2-V3 (similar to previous tracings).  - echocardiogram is pending to assess LV function and wall motion.   2. Elevated Troponin - Cyclic troponin values have been 0.04, 0.19, 0.05, 0.10, and 0.07. - EKG is without acute ischemic changes. Likely secondary to demand ischemia in the setting of Urosepsis.   3. Chronic Diastolic CHF - echo in [id number] showed normal EF of 60-65% with no regional WMA.  - does not appear volume overloaded on physical exam.   4. HTN - has been 132/36 - 169/64 in the past 24 hours. - continue current medication regimen.   5. Urosepsis - WBC count trending upwards to 34.0. - per admitting team   Signed, Erma Heritage, PA-C 11/02/2016, 10:30 AM Pager: [phone]  Attending Note  Patient seen and discussed with PA Ahmed Prima, I agree with her documentation. 65 yo male history of HTN, DM2, chronic diastolic HF, prior CVA admitted with fever and body aches, pleuritic chest pain. Managed by primary team for urosepsis. Cardiology is consulted for elevated troponin and chest pain.   Na 128, K 3.3, Cr 1.06, WBC 20, Hgb 11, lactic acid 1.2, BNP 215,  Trop 0.09--> 0.04-->0.19-->0.05-->0.10-->0.07 CXR no acute process EKG SR, biatrial enlargement, anteroseptal Qwaves Echo pending   Fairly odd up and down trend of troponins, overall mild elevation. Probable demand ischemia in setting of urosepsis. He does have anteroseptal Qwaves and CAD risk factors, I suspect probable increased demand in setting of chronic obstructive disease as opposed to acute obstructive CAD/ACS. Chest pain is atypical, its positional and reproducible. F/u echo. Given trop leak, risk factors, chest pain though it is atypical, and Qwaves on EKG would plan for noninvasive stress testing once urosepsis has resolved and pending echo results.   Zandra Abts MD

## 2016-11-02 NOTE — Progress Notes (Signed)
PROGRESS NOTE    [name]  [id number] DOB: June 01, 1951 DOA: 11/01/2016 PCP: Philis Fendt, MD   Chief Complaint  Patient presents with  . Dizziness    Brief Narrative:  HPI on 11/01/2016 by Dr. Lily Kocher AAMARI [name] is a 65 y.o. gentleman with a history of HTN, DM, diastolic heart failure, prior CVA, pituitary adenoma, and prostatitis/UTI who presents to the ED complaining of fever, body aches, dizziness, nausea, chest pain, and shortness of breath, onset within the 24 hours prior to presentation.  He has had chills and sweats.  Chest pain is worse with movement or deep inspiration.  It is intermittent.  No cough.  No rash.  No vomiting.  No LOC.  His sister has been diagnosed with pneumonia and admitted to [facility]; otherwise, he denies known sick contacts.  He has already had his annual influenza vaccine this year.  He denies recent travel or new exposures.  No antibiotics or hospitalization in the past 30 days.  No recent travel or new exposures. He has had dysuria.  Assessment & Plan   Sepsis secondary to UTI -Upon admission, patient was febrile with leukocytosis -UA showed TNTC WBC, many bacteria, large leukocytes -Blood and urine cultures pending -Influenza PCR negative -Chest x-ray unremarkable -patient continues to have elevated WBC (now up to 34)- denies diarrhea. 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)  Atypical chest pain/Elevated troponin/pleuritic -Patient currently complains of chest pain that is reproducible with palpation and worsens with deep inspiration -Troponin peaked to 0.19, now back to 0.07 -Pending 12lead EKG -Will continue to monitor closely -Echocardiogram pending -Continue aspirin, statin  Chronic diastolic heart failure -Echocardiogram 03/19/2015 showed an EF of 123456, grade 1 diastolic dysfunction -Currently appears to be euvolemic and compensated -BNP 215  Hypokalemia -Resolved, continue monitor  BMP  Essential hypertension -Continue amlodipine, hydralazine, Isordil  Diabetes mellitus, type II -metformin held -Continue ISS and CBG monitoring  Chronic pain -Continue pain control when necessary  GERD -Continue PPI  Tobacco abuse -Smoking cessation discussed -Continue nicotine patch  DVT Prophylaxis  Lovenox  Code Status: Full  Family Communication: None at bedside  Disposition Plan: Observation  Consultants None  Procedures  None  Antibiotics   Anti-infectives    Start     Dose/Rate Route Frequency Ordered Stop   11/01/16 0900  cefTRIAXone (ROCEPHIN) 1 g in dextrose 5 % 50 mL IVPB     1 g 100 mL/hr over 30 Minutes Intravenous Every 24 hours 11/01/16 0818        Subjective:   Charlestine Massed Helbling seen and examined today.  Patient complains of chest pain today- states it is the same as 2 days ago when he came to the hospital.  Feels it worsens with deep breathing. Complained nausea earlier. Denies vomiting. Denies shortness of breath. Denies any shortness of breath, abdominal pain, headache or dizziness..    Objective:   Vitals:   11/01/16 2209 11/01/16 2355 11/02/16 0200 11/02/16 0547  BP: (!) 143/46 (!) 132/36  (!) 134/42  Pulse: 72 (!) 115  67  Resp: 18 (!) 24  18  Temp: 98.3 F (36.8 C) (!) 100.7 F (38.2 C) 99.1 F (37.3 C) 98.2 F (36.8 C)  TempSrc: Oral Oral Oral Oral  SpO2: 99% 99%  98%  Weight:    72.4 kg (159 lb 11.2 oz)  Height:        Intake/Output Summary (Last 24 hours) at 11/02/16 1032 Last data filed  at 11/02/16 0800  Gross per 24 hour  Intake             1010 ml  Output                0 ml  Net             1010 ml   Filed Weights   11/01/16 0314 11/02/16 0547  Weight: 72.6 kg (160 lb) 72.4 kg (159 lb 11.2 oz)    Exam  General: Well developed, well nourished, NAD, appears stated age  50: NCAT,  mucous membranes moist.   Cardiovascular: S1 S2 auscultated, RRR, no murmurs  Respiratory: Clear to auscultation bilaterally  with equal chest rise  Musculoskeletal: TTP of the chest wall (mid and left)  Abdomen: Soft, nontender, nondistended, + bowel sounds  Extremities: warm dry without cyanosis clubbing or edema  Neuro: AAOx3, nonfocal  Psych: Normal affect and demeanor    Data Reviewed: I have personally reviewed following labs and imaging studies  CBC:  Recent Labs Lab 11/01/16 0327 11/02/16 0305 11/02/16 0728  WBC 20.8* 30.4* 34.0*  HGB 11.0* 10.6* 10.2*  HCT 34.3* 32.9* 32.1*  MCV 70.9* 70.8* 70.7*  PLT 222 196 123456   Basic Metabolic Panel:  Recent Labs Lab 11/01/16 0327 11/02/16 0305  NA 128* 140  K 3.3* 4.0  CL 95* 110  CO2 21* 24  GLUCOSE 183* 139*  BUN 17 25*  CREATININE 1.06 0.97  CALCIUM 8.7* 8.9   GFR: Estimated Creatinine Clearance: 76.9 mL/min (by C-G formula based on SCr of 0.97 mg/dL). Liver Function Tests:  Recent Labs Lab 11/01/16 0536  AST 30  ALT 21  ALKPHOS 64  BILITOT 0.7  PROT 8.4*  ALBUMIN 3.2*   No results for input(s): LIPASE, AMYLASE in the last 168 hours. No results for input(s): AMMONIA in the last 168 hours. Coagulation Profile:  Recent Labs Lab 11/01/16 0859  INR 1.08   Cardiac Enzymes:  Recent Labs Lab 11/01/16 0859 11/01/16 1316 11/01/16 1559 11/01/16 2158 11/02/16 0305  TROPONINI 0.04* 0.19* 0.05* 0.10* 0.07*   BNP (last 3 results) No results for input(s): PROBNP in the last 8760 hours. HbA1C: No results for input(s): HGBA1C in the last 72 hours. CBG:  Recent Labs Lab 11/01/16 0813 11/01/16 1133 11/01/16 1656 11/01/16 2149 11/02/16 0624  GLUCAP 137* 294* 234* 188* 159*   Lipid Profile: No results for input(s): CHOL, HDL, LDLCALC, TRIG, CHOLHDL, LDLDIRECT in the last 72 hours. Thyroid Function Tests: No results for input(s): TSH, T4TOTAL, FREET4, T3FREE, THYROIDAB in the last 72 hours. Anemia Panel: No results for input(s): VITAMINB12, FOLATE, FERRITIN, TIBC, IRON, RETICCTPCT in the last 72 hours. Urine  analysis:    Component Value Date/Time   COLORURINE YELLOW 11/01/2016 0739   APPEARANCEUR TURBID (A) 11/01/2016 0739   LABSPEC 1.011 11/01/2016 0739   PHURINE 7.5 11/01/2016 0739   GLUCOSEU NEGATIVE 11/01/2016 0739   HGBUR TRACE (A) 11/01/2016 0739   BILIRUBINUR NEGATIVE 11/01/2016 0739   KETONESUR NEGATIVE 11/01/2016 0739   PROTEINUR NEGATIVE 11/01/2016 0739   UROBILINOGEN 2.0 (H) 08/06/2015 1209   NITRITE NEGATIVE 11/01/2016 0739   LEUKOCYTESUR LARGE (A) 11/01/2016 0739   Sepsis Labs: @LABRCNTIP (procalcitonin:4,lacticidven:4)  ) Recent Results (from the past 240 hour(s))  Culture, blood (routine x 2)     Status: None (Preliminary result)   Collection Time: 11/01/16  5:40 AM  Result Value Ref Range Status   Specimen Description BLOOD LEFT ARM  Final   Special Requests  IN PEDIATRIC BOTTLE 4ML  Final   Culture NO GROWTH 1 DAY  Final   Report Status PENDING  Incomplete  Culture, blood (routine x 2)     Status: None (Preliminary result)   Collection Time: 11/01/16  5:45 AM  Result Value Ref Range Status   Specimen Description BLOOD LEFT HAND  Final   Special Requests BOTTLES DRAWN AEROBIC AND ANAEROBIC 5ML  Final   Culture NO GROWTH 1 DAY  Final   Report Status PENDING  Incomplete  Respiratory Panel by PCR     Status: None   Collection Time: 11/01/16  6:26 AM  Result Value Ref Range Status   Adenovirus NOT DETECTED NOT DETECTED Final   Coronavirus 229E NOT DETECTED NOT DETECTED Final   Coronavirus HKU1 NOT DETECTED NOT DETECTED Final   Coronavirus NL63 NOT DETECTED NOT DETECTED Final   Coronavirus OC43 NOT DETECTED NOT DETECTED Final   Metapneumovirus NOT DETECTED NOT DETECTED Final   Rhinovirus / Enterovirus NOT DETECTED NOT DETECTED Final   Influenza A NOT DETECTED NOT DETECTED Final   Influenza B NOT DETECTED NOT DETECTED Final   Parainfluenza Virus 1 NOT DETECTED NOT DETECTED Final   Parainfluenza Virus 2 NOT DETECTED NOT DETECTED Final   Parainfluenza Virus 3 NOT  DETECTED NOT DETECTED Final   Parainfluenza Virus 4 NOT DETECTED NOT DETECTED Final   Respiratory Syncytial Virus NOT DETECTED NOT DETECTED Final   Bordetella pertussis NOT DETECTED NOT DETECTED Final   Chlamydophila pneumoniae NOT DETECTED NOT DETECTED Final   Mycoplasma pneumoniae NOT DETECTED NOT DETECTED Final  Urine culture     Status: Abnormal (Preliminary result)   Collection Time: 11/01/16  7:39 AM  Result Value Ref Range Status   Specimen Description URINE, CLEAN CATCH  Final   Special Requests NONE  Final   Culture >=100,000 COLONIES/mL GRAM NEGATIVE RODS (A)  Final   Report Status PENDING  Incomplete      Radiology Studies: Dg Chest 2 View  Result Date: 11/01/2016 CLINICAL DATA:  Chest dating radiating to the upper extremity for 2 days EXAM: CHEST  2 VIEW COMPARISON:  09/04/2016 FINDINGS: The lungs are clear. The pulmonary vasculature is normal. Heart size is normal. Hilar and mediastinal contours are unremarkable. There is no pleural effusion. IMPRESSION: No active cardiopulmonary disease. Electronically Signed   By: Andreas Newport M.D.   On: 11/01/2016 03:49     Scheduled Meds: . amLODipine  10 mg Oral Daily  . aspirin EC  81 mg Oral Daily  . cefTRIAXone (ROCEPHIN)  IV  1 g Intravenous Q24H  . enoxaparin (LOVENOX) injection  40 mg Subcutaneous Q24H  . feeding supplement (ENSURE ENLIVE)  237 mL Oral BID BM  . gabapentin  100 mg Oral TID  . hydrALAZINE  100 mg Oral Q8H  . insulin aspart  0-15 Units Subcutaneous TID WC  . insulin aspart  0-5 Units Subcutaneous QHS  . isosorbide dinitrate  10 mg Oral TID  . nicotine  14 mg Transdermal Daily  . pantoprazole  20 mg Oral Daily  . pravastatin  20 mg Oral QHS  . sodium chloride flush  3 mL Intravenous Q12H  . tamsulosin  0.4 mg Oral Daily   Continuous Infusions:   LOS: 1 day   Time Spent in minutes   30 minutes  Eesha Schmaltz D.O. on 11/02/2016 at 10:32 AM  Between 7am to 7pm - Pager - [phone]  After  7pm go to [email or web address] - password [email or web address]  And look  for the night coverage person covering for me after hours  [facility] Office  [phone]

## 2016-11-03 ENCOUNTER — Inpatient Hospital Stay (HOSPITAL_COMMUNITY): Payer: Medicaid Other

## 2016-11-03 DIAGNOSIS — R748 Abnormal levels of other serum enzymes: Secondary | ICD-10-CM

## 2016-11-03 DIAGNOSIS — R079 Chest pain, unspecified: Secondary | ICD-10-CM

## 2016-11-03 DIAGNOSIS — R509 Fever, unspecified: Secondary | ICD-10-CM

## 2016-11-03 DIAGNOSIS — R778 Other specified abnormalities of plasma proteins: Secondary | ICD-10-CM

## 2016-11-03 DIAGNOSIS — R110 Nausea: Secondary | ICD-10-CM

## 2016-11-03 DIAGNOSIS — R7989 Other specified abnormal findings of blood chemistry: Secondary | ICD-10-CM

## 2016-11-03 LAB — GLUCOSE, CAPILLARY
Glucose-Capillary: 111 mg/dL — ABNORMAL HIGH (ref 65–99)
Glucose-Capillary: 180 mg/dL — ABNORMAL HIGH (ref 65–99)
Glucose-Capillary: 164 mg/dL — ABNORMAL HIGH (ref 65–99)

## 2016-11-03 LAB — BASIC METABOLIC PANEL
Anion gap: 12 (ref 5–15)
BUN: 21 mg/dL — AB (ref 6–20)
CALCIUM: 8.7 mg/dL — AB (ref 8.9–10.3)
CO2: 20 mmol/L — AB (ref 22–32)
Chloride: 111 mmol/L (ref 101–111)
Creatinine, Ser: 1 mg/dL (ref 0.61–1.24)
GFR calc Af Amer: >60 mL/min (ref >60)
GLUCOSE: 96 mg/dL (ref 65–99)
Potassium: 4.2 mmol/L (ref 3.5–5.1)
Sodium: 143 mmol/L (ref 135–145)

## 2016-11-03 LAB — CBC
HCT: 33.4 % — ABNORMAL LOW (ref 39.0–52.0)
Hemoglobin: 11.4 g/dL — ABNORMAL LOW (ref 13.0–17.0)
MCH: 24.2 pg — AB (ref 26.0–34.0)
MCHC: 34.1 g/dL (ref 30.0–36.0)
MCV: 70.9 fL — AB (ref 78.0–100.0)
PLATELETS: 194 10*3/uL (ref 150–400)
RBC: 4.71 MIL/uL (ref 4.22–5.81)
RDW: 15.5 % (ref 11.5–15.5)
WBC: 18.7 10*3/uL — ABNORMAL HIGH (ref 4.0–10.5)

## 2016-11-03 LAB — URINE CULTURE: Culture: >=100000 — AB

## 2016-11-03 LAB — ECHOCARDIOGRAM COMPLETE
Height: 69 in
Weight: 2558.4 [oz_av]

## 2016-11-03 MED ORDER — DEXTROSE 5 % IV SOLN
1.0000 g | INTRAVENOUS | Status: DC
  Administered 2016-11-03: 1 g via INTRAVENOUS
  Filled 2016-11-03: qty 10

## 2016-11-03 NOTE — Progress Notes (Signed)
Patient stated that he wants to leave AMA. Physician has been notified.

## 2016-11-03 NOTE — Progress Notes (Signed)
Patient Name: [name] Date of Encounter: 11/03/2016  Primary Cardiologist: Dr. Johnsie [name], last seen in 11/2013  Hospital Problem List     Principal Problem:   Fever Active Problems:   Diabetes mellitus type 2, controlled ([place])   Tobacco use disorder   Leukocytosis   Essential hypertension   Hypokalemia   Chest pain   Chronic diastolic heart failure ([facility])   Nausea   Dysuria     Subjective   Feels better, denies chest pain and SOB.   Inpatient Medications    Scheduled Meds: . amLODipine  10 mg Oral Daily  . aspirin EC  81 mg Oral Daily  . enoxaparin (LOVENOX) injection  40 mg Subcutaneous Q24H  . feeding supplement (ENSURE ENLIVE)  237 mL Oral BID BM  . gabapentin  100 mg Oral TID  . hydrALAZINE  100 mg Oral Q8H  . insulin aspart  0-15 Units Subcutaneous TID WC  . insulin aspart  0-5 Units Subcutaneous QHS  . isosorbide dinitrate  10 mg Oral TID  . linezolid (ZYVOX) IV  600 mg Intravenous Q12H  . nicotine  14 mg Transdermal Daily  . pantoprazole  20 mg Oral Daily  . pravastatin  20 mg Oral QHS  . sodium chloride flush  3 mL Intravenous Q12H  . tamsulosin  0.4 mg Oral Daily   Continuous Infusions:  PRN Meds: acetaminophen **OR** acetaminophen, ipratropium-albuterol, ondansetron **OR** ondansetron (ZOFRAN) IV, oxyCODONE-acetaminophen   Vital Signs    Vitals:   11/02/16 0547 11/02/16 1329 11/02/16 2135 11/03/16 0505  BP: (!) 134/42 (!) 126/42 (!) 136/54 (!) 138/40  Pulse: 67 80 73 68  Resp: 18 16 18 18   Temp: 98.2 F (36.8 C) 98.2 F (36.8 C) 99.8 F (37.7 C) 99.3 F (37.4 C)  TempSrc: Oral Oral Oral Oral  SpO2: 98% 100% 97% 96%  Weight: 159 lb 11.2 oz (72.4 kg)   159 lb 14.4 oz (72.5 kg)  Height:        Intake/Output Summary (Last 24 hours) at 11/03/16 0832 Last data filed at 11/02/16 1800  Gross per 24 hour  Intake              240 ml  Output                0 ml  Net              240 ml   Filed Weights   11/01/16 0314 11/02/16 0547  11/03/16 0505  Weight: 160 lb (72.6 kg) 159 lb 11.2 oz (72.4 kg) 159 lb 14.4 oz (72.5 kg)    Physical Exam   GEN: Well nourished, well developed, in no acute distress.  HEENT: Grossly normal.  Neck: Supple, no JVD, carotid bruits, or masses. Cardiac: RRR, 3/6 systolic murmurs, rubs, or gallops. No clubbing, cyanosis, edema.  Radials/DP/PT 2+ and equal bilaterally.  Respiratory:  Respirations regular and unlabored, clear to auscultation bilaterally. GI: Soft, nontender, nondistended, BS + x 4. MS: no deformity or atrophy. Skin: warm and dry, no rash. Neuro:  Strength and sensation are intact. Psych: AAOx3.  Normal affect.  Labs    CBC  Recent Labs  11/02/16 0728 11/03/16 0226  WBC 34.0* 18.7*  HGB 10.2* 11.4*  HCT 32.1* 33.4*  MCV 70.7* 70.9*  PLT 213 [id number]   Basic Metabolic Panel  Recent Labs  11/02/16 0305 11/03/16 0226  NA 140 143  K 4.0 4.2  CL 110 111  CO2 24 20*  GLUCOSE  139* 96  BUN 25* 21*  CREATININE 0.97 1.00  CALCIUM 8.9 8.7*   Liver Function Tests  Recent Labs  11/01/16 0536  AST 30  ALT 21  ALKPHOS 64  BILITOT 0.7  PROT 8.4*  ALBUMIN 3.2*   Cardiac Enzymes  Recent Labs  11/01/16 1559 11/01/16 2158 11/02/16 0305  TROPONINI 0.05* 0.10* 0.07*   Hemoglobin A1C  Recent Labs  11/01/16 0921  HGBA1C 6.2*     Telemetry    NSR  - Personally Reviewed  ECG     Sinus tach, anteroseptal q waves- Personally Reviewed  Radiology    No results found.  Cardiac Studies  Last Echo was in March 2016:  Study Conclusions  - Left ventricle: The cavity size was normal. Wall thickness was increased in a pattern of mild LVH. Systolic function was normal. The estimated ejection fraction was in the range of 60% to 65%. There was dynamic obstruction. Wall motion was normal; there were no regional wall motion abnormalities. Doppler parameters are consistent with abnormal left ventricular relaxation (grade 1 diastolic  dysfunction). - Aortic valve: There was trivial regurgitation. Valve area (VTI): 2.95 cm^2. Valve area (Vmax): 2.81 cm^2. Valve area (Vmean): 2.95 cm^2. - Mitral valve: There was mild regurgitation.   Patient Profile     65 year old male admitted on 11/01/16 for urosepsis, troponin elevated and has had intermittent chest pain 3 days prior to admission.   Assessment & Plan    1. Pleuretic chest pain: Presented with fever, chest pain, and diffuse body aches. Reported developing chest pain 3 days ago which has been constant. Present with rest and exertion. No variance in symptoms with positional changes. Pain is exacerbated with deep inspiration, and is reproducible on examination.   Cyclic troponin values have been 0.04, 0.19, 0.05, 0.10, and 0.07. EKG shows sinus tachycardia, HR 114, with borderline LVH and up-sloping ST in leads V2-V3 (similar to previous tracings). Also with Qwaves on EKG.   Echocardiogram is pending to assess LV function and wall motion. Also question if there is a pericarditis component to his pain?  2. Elevated Troponin Cyclic troponin values have been 0.04, 0.19, 0.05, 0.10, and 0.07.  EKG is without acute ischemic changes. Likely secondary to demand ischemia in the setting of Urosepsis, but given troponin leak, risk factors and abnormal EKG would plan for nuclear stress test once urosepsis has resolved.   3. Chronic Diastolic CHF Echo in [id number] showed normal EF of 60-65% with no regional WMA. Waiting on repeat Echo to compare.   Does not appear volume overloaded on physical exam.  4. HTN: Well controlled on current regimen.   5. Urosepsis: Per primary team.     Signed, Samuel Leas, NP  11/03/2016, 8:32 AM    Attending Note:   The patient was seen and examined.  Agree with assessment and plan as noted above.  Changes made to the above note as needed.  Patient seen and independently examined with [name] , NP.   We discussed all aspects of the  encounter. I agree with the assessment and plan as stated above.  I suspect that his troponin elevations are related to his sepsis syndrome. We will sign off if the echo is unremarkable. He can follow up with [place] as needed. [name] / [place] )    I have spent a total of 30 minutes with patient reviewing hospital  notes , telemetry, EKGs, labs and examining patient as well as establishing an assessment  and plan that was discussed with the patient. > 50% of time was spent in direct patient care.  Thayer Headings, Brooke Bonito., MD, [facility] 11/03/2016, 12:21 PM [address],  [place] Pager [phone]

## 2016-11-03 NOTE — Progress Notes (Signed)
  Echocardiogram 2D Echocardiogram has been performed.  Bobbye Charleston 11/03/2016, 2:29 PM

## 2016-11-03 NOTE — Progress Notes (Signed)
Nutrition Brief Note  Patient identified on the Malnutrition Screening Tool (MST) Report.  Wt Readings from Last 15 Encounters:  11/03/16 159 lb 14.4 oz (72.5 kg)  09/04/16 160 lb (72.6 kg)  07/23/16 156 lb (70.8 kg)  07/05/16 165 lb (74.8 kg)  06/25/16 165 lb (74.8 kg)  01/02/16 165 lb 9.6 oz (75.1 kg)  08/06/15 160 lb (72.6 kg)  03/18/15 167 lb (75.8 kg)  01/27/15 175 lb 4.3 oz (79.5 kg)  11/04/14 158 lb 11.7 oz (72 kg)  10/15/14 163 lb (73.9 kg)  08/15/14 161 lb 6 oz (73.2 kg)  06/16/14 160 lb (72.6 kg)  05/15/14 160 lb (72.6 kg)  12/26/13 159 lb 13.3 oz (72.5 kg)    Body mass index is 23.61 kg/m. Patient meets criteria for Normal  based on current BMI.   Current diet order is Heart Healthy/Carbohydrate Modified, patient is consuming approximately 100% of meals at this time. Labs and medications reviewed.   No nutrition interventions warranted at this time. If nutrition issues arise, please consult RD.   Arthur Holms, RD, LDN Pager #: [phone] After-Hours Pager #: [phone]

## 2016-11-03 NOTE — Progress Notes (Addendum)
PROGRESS NOTE    [name]  [id number] DOB: 20-Jan-1951 DOA: 11/01/2016 PCP: Philis Fendt, MD   Chief Complaint  Patient presents with  . Dizziness    Brief Narrative:  HPI on 11/01/2016 by Dr. Lily Kocher [name] is a 65 y.o. gentleman with a history of HTN, DM, diastolic heart failure, prior CVA, pituitary adenoma, and prostatitis/UTI who presents to the ED complaining of fever, body aches, dizziness, nausea, chest pain, and shortness of breath, onset within the 24 hours prior to presentation.  He has had chills and sweats.  Chest pain is worse with movement or deep inspiration.  It is intermittent.  No cough.  No rash.  No vomiting.  No LOC.  His sister has been diagnosed with pneumonia and admitted to [facility]; otherwise, he denies known sick contacts.  He has already had his annual influenza vaccine this year.  He denies recent travel or new exposures.  No antibiotics or hospitalization in the past 30 days.  No recent travel or new exposures. He has had dysuria.  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 Today feels pain is gone. -Troponin peaked to 0.19, now back to 0.07 -Will continue to monitor closely -Echocardiogram pending -Continue aspirin, statin -Cardiology consulted and appreciated  Chronic diastolic heart failure -Echocardiogram 03/19/2015 showed an EF of 123456, grade 1 diastolic dysfunction -Currently appears to be euvolemic and compensated -BNP  215  Hypokalemia -Resolved, continue monitor BMP  Essential hypertension -Continue amlodipine, hydralazine, Isordil  Diabetes mellitus, type II -metformin held -Continue ISS and CBG monitoring  Chronic pain -Continue pain control when necessary  GERD -Continue PPI  Tobacco abuse -Smoking cessation discussed -Continue nicotine patch  DVT Prophylaxis  Lovenox  Code Status: Full  Family Communication: None at bedside  Disposition Plan: Admitted. Continue to treat sepsis, pending cardiology. Home when stable.  Consultants Cardiology  Procedures  None  Antibiotics   Anti-infectives    Start     Dose/Rate Route Frequency Ordered Stop   11/02/16 1100  linezolid (ZYVOX) IVPB 600 mg     600 mg 300 mL/hr over 60 Minutes Intravenous Every 12 hours 11/02/16 1040     11/01/16 0900  cefTRIAXone (ROCEPHIN) 1 g in dextrose 5 % 50 mL IVPB  Status:  Discontinued     1 g 100 mL/hr over 30 Minutes Intravenous Every 24 hours 11/01/16 0818 11/02/16 1038      Subjective:   [name] seen and examined today.  Patient denies further chest pain today, feels it has resolved.  Denies any shortness of breath, abdominal pain, nausea, vomiting, headache or dizziness..    Objective:   Vitals:   11/02/16 0547 11/02/16 1329 11/02/16 2135 11/03/16 0505  BP: (!) 134/42 (!) 126/42 (!) 136/54 (!) 138/40  Pulse: 67 80 73 68  Resp: 18 16 18 18   Temp: 98.2 F (36.8 C) 98.2 F (36.8 C) 99.8 F (37.7 C) 99.3 F (37.4 C)  TempSrc: Oral Oral Oral Oral  SpO2: 98% 100% 97% 96%  Weight: 72.4 kg (159 lb 11.2 oz)   72.5 kg (  159 lb 14.4 oz)  Height:        Intake/Output Summary (Last 24 hours) at 11/03/16 0916 Last data filed at 11/02/16 1800  Gross per 24 hour  Intake              240 ml  Output                0 ml  Net              240 ml   Filed Weights   11/01/16 0314 11/02/16 0547 11/03/16 0505  Weight: 72.6 kg (160 lb) 72.4 kg (159 lb 11.2 oz) 72.5 kg (159 lb 14.4 oz)     Exam  General: Well developed, well nourished, NAD  HEENT: NCAT,  mucous membranes moist. Poor dentition  Cardiovascular: S1 S2 auscultated, RRR, no murmurs  Respiratory: Clear to auscultation bilaterally with equal chest rise  Musculoskeletal: nontender  Abdomen: Soft, nontender, nondistended, + bowel sounds  Extremities: warm dry without cyanosis clubbing or edema  Neuro: AAOx3, nonfocal  Psych: Normal affect and demeanor, pleasant   Data Reviewed: I have personally reviewed following labs and imaging studies  CBC:  Recent Labs Lab 11/01/16 0327 11/02/16 0305 11/02/16 0728 11/03/16 0226  WBC 20.8* 30.4* 34.0* 18.7*  HGB 11.0* 10.6* 10.2* 11.4*  HCT 34.3* 32.9* 32.1* 33.4*  MCV 70.9* 70.8* 70.7* 70.9*  PLT 222 196 213 [id number]   Basic Metabolic Panel:  Recent Labs Lab 11/01/16 0327 11/02/16 0305 11/03/16 0226  NA 128* 140 143  K 3.3* 4.0 4.2  CL 95* 110 111  CO2 21* 24 20*  GLUCOSE 183* 139* 96  BUN 17 25* 21*  CREATININE 1.06 0.97 1.00  CALCIUM 8.7* 8.9 8.7*   GFR: Estimated Creatinine Clearance: 74.6 mL/min (by C-G formula based on SCr of 1 mg/dL). Liver Function Tests:  Recent Labs Lab 11/01/16 0536  AST 30  ALT 21  ALKPHOS 64  BILITOT 0.7  PROT 8.4*  ALBUMIN 3.2*   No results for input(s): LIPASE, AMYLASE in the last 168 hours. No results for input(s): AMMONIA in the last 168 hours. Coagulation Profile:  Recent Labs Lab 11/01/16 0859  INR 1.08   Cardiac Enzymes:  Recent Labs Lab 11/01/16 0859 11/01/16 1316 11/01/16 1559 11/01/16 2158 11/02/16 0305  TROPONINI 0.04* 0.19* 0.05* 0.10* 0.07*   BNP (last 3 results) No results for input(s): PROBNP in the last 8760 hours. HbA1C:  Recent Labs  11/01/16 0921  HGBA1C 6.2*   CBG:  Recent Labs Lab 11/02/16 0624 11/02/16 1130 11/02/16 1639 11/02/16 2126 11/03/16 0649  GLUCAP 159* 176* 165* 105* 111*   Lipid Profile: No results for input(s): CHOL, HDL, LDLCALC, TRIG,  CHOLHDL, LDLDIRECT in the last 72 hours. Thyroid Function Tests: No results for input(s): TSH, T4TOTAL, FREET4, T3FREE, THYROIDAB in the last 72 hours. Anemia Panel: No results for input(s): VITAMINB12, FOLATE, FERRITIN, TIBC, IRON, RETICCTPCT in the last 72 hours. Urine analysis:    Component Value Date/Time   COLORURINE YELLOW 11/01/2016 0739   APPEARANCEUR TURBID (A) 11/01/2016 0739   LABSPEC 1.011 11/01/2016 0739   PHURINE 7.5 11/01/2016 0739   GLUCOSEU NEGATIVE 11/01/2016 0739   HGBUR TRACE (A) 11/01/2016 0739   BILIRUBINUR NEGATIVE 11/01/2016 0739   KETONESUR NEGATIVE 11/01/2016 0739   PROTEINUR NEGATIVE 11/01/2016 0739   UROBILINOGEN 2.0 (H) 08/06/2015 1209   NITRITE NEGATIVE 11/01/2016 0739   LEUKOCYTESUR LARGE (A) 11/01/2016 0739   Sepsis Labs: @LABRCNTIP (procalcitonin:4,lacticidven:4)  ) Recent Results (from the past  240 hour(s))  Culture, blood (routine x 2)     Status: None (Preliminary result)   Collection Time: 11/01/16  5:40 AM  Result Value Ref Range Status   Specimen Description BLOOD LEFT ARM  Final   Special Requests IN PEDIATRIC BOTTLE 4ML  Final   Culture NO GROWTH 1 DAY  Final   Report Status PENDING  Incomplete  Culture, blood (routine x 2)     Status: None (Preliminary result)   Collection Time: 11/01/16  5:45 AM  Result Value Ref Range Status   Specimen Description BLOOD LEFT HAND  Final   Special Requests BOTTLES DRAWN AEROBIC AND ANAEROBIC 5ML  Final   Culture NO GROWTH 1 DAY  Final   Report Status PENDING  Incomplete  Respiratory Panel by PCR     Status: None   Collection Time: 11/01/16  6:26 AM  Result Value Ref Range Status   Adenovirus NOT DETECTED NOT DETECTED Final   Coronavirus 229E NOT DETECTED NOT DETECTED Final   Coronavirus HKU1 NOT DETECTED NOT DETECTED Final   Coronavirus NL63 NOT DETECTED NOT DETECTED Final   Coronavirus OC43 NOT DETECTED NOT DETECTED Final   Metapneumovirus NOT DETECTED NOT DETECTED Final   Rhinovirus /  Enterovirus NOT DETECTED NOT DETECTED Final   Influenza A NOT DETECTED NOT DETECTED Final   Influenza B NOT DETECTED NOT DETECTED Final   Parainfluenza Virus 1 NOT DETECTED NOT DETECTED Final   Parainfluenza Virus 2 NOT DETECTED NOT DETECTED Final   Parainfluenza Virus 3 NOT DETECTED NOT DETECTED Final   Parainfluenza Virus 4 NOT DETECTED NOT DETECTED Final   Respiratory Syncytial Virus NOT DETECTED NOT DETECTED Final   Bordetella pertussis NOT DETECTED NOT DETECTED Final   Chlamydophila pneumoniae NOT DETECTED NOT DETECTED Final   Mycoplasma pneumoniae NOT DETECTED NOT DETECTED Final  Urine culture     Status: Abnormal (Preliminary result)   Collection Time: 11/01/16  7:39 AM  Result Value Ref Range Status   Specimen Description URINE, CLEAN CATCH  Final   Special Requests NONE  Final   Culture >=100,000 COLONIES/mL ESCHERICHIA COLI (A)  Final   Report Status PENDING  Incomplete   Organism ID, Bacteria ESCHERICHIA COLI (A)  Final      Susceptibility   Escherichia coli - MIC*    AMPICILLIN >=32 RESISTANT Resistant     CEFAZOLIN >=64 RESISTANT Resistant     CEFTRIAXONE <=1 SENSITIVE Sensitive     CIPROFLOXACIN >=4 RESISTANT Resistant     GENTAMICIN <=1 SENSITIVE Sensitive     IMIPENEM <=0.25 SENSITIVE Sensitive     NITROFURANTOIN <=16 SENSITIVE Sensitive     TRIMETH/SULFA <=20 SENSITIVE Sensitive     AMPICILLIN/SULBACTAM >=32 RESISTANT Resistant     PIP/TAZO 64 INTERMEDIATE Intermediate     Extended ESBL NEGATIVE Sensitive     * >=100,000 COLONIES/mL ESCHERICHIA COLI      Radiology Studies: No results found.   Scheduled Meds: . amLODipine  10 mg Oral Daily  . aspirin EC  81 mg Oral Daily  . enoxaparin (LOVENOX) injection  40 mg Subcutaneous Q24H  . feeding supplement (ENSURE ENLIVE)  237 mL Oral BID BM  . gabapentin  100 mg Oral TID  . hydrALAZINE  100 mg Oral Q8H  . insulin aspart  0-15 Units Subcutaneous TID WC  . insulin aspart  0-5 Units Subcutaneous QHS  .  isosorbide dinitrate  10 mg Oral TID  . linezolid (ZYVOX) IV  600 mg Intravenous Q12H  . nicotine  14 mg Transdermal Daily  . pantoprazole  20 mg Oral Daily  . pravastatin  20 mg Oral QHS  . sodium chloride flush  3 mL Intravenous Q12H  . tamsulosin  0.4 mg Oral Daily   Continuous Infusions:   LOS: 2 days   Time Spent in minutes   30 minutes  Chelby Salata D.O. on 11/03/2016 at 9:16 AM  Between 7am to 7pm - Pager - [phone]  After 7pm go to [email or web address] - password [email or web address]  And look for the night coverage person covering for me after hours  [facility] Office  [phone]

## 2016-11-03 NOTE — Progress Notes (Signed)
Patient decided to leave AMA. Multiple attempts were made to convince him to stay. It was explained that insurance may not cover his stay if he left. IV was d'cd and was intact. Several phone calls were made to the physician to make them aware.

## 2016-11-04 NOTE — Discharge Summary (Signed)
Physician Discharge Summary  [name] [id number] DOB: [birth year] DOA: 11/01/2016  PCP: Philis Fendt, MD  Admit date: 11/01/2016 Discharge date: 11/03/2016  Time spent: 10 minutes  Recommendations for Outpatient Follow-up:  Patient left AMA. No recommendations  Discharge Diagnoses:  Sepsis secondary to [facility] UTI Atypical chest pain/Elevated troponin/pleuritic Chronic diastolic heart failure Hypokalemia Essential hypertension Diabetes mellitus, type II Chronic pain GERD Tobacco abuse  Discharge Condition: Not Stable  Diet recommendation: None  Filed Weights   11/01/16 0314 11/02/16 0547 11/03/16 0505  Weight: 72.6 kg (160 lb) 72.4 kg (159 lb 11.2 oz) 72.5 kg (159 lb 14.4 oz)    History of present illness:  on 11/01/2016 by Dr. Margaretann Loveless Allredis a 65 y.o.gentleman with a history of HTN, DM, diastolic heart failure, prior CVA, pituitary adenoma, and prostatitis/UTI who presents to the ED complaining of fever, body aches, dizziness, nausea, chest pain, and shortness of breath, onset within the 24 hours prior to presentation. He has had chills and sweats. Chest pain is worse with movement or deep inspiration. It is intermittent. No cough. No rash. No vomiting. No LOC. His sister has been diagnosed with pneumonia and admitted to [facility]; otherwise, he denies known sick contacts. He has already had his annual influenza vaccine this year. He denies recent travel or new exposures. No antibiotics or hospitalization in the past 30 days. No recent travel or new exposures. He has had dysuria.  Hospital Course:  Patient left AMA.  He was counseled on the need to stay admitted.    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  -Given Ecoli, antibiotics were deescalated.  -WBC trending downward today, 18.7  Atypical chest pain/Elevated troponin/pleuritic -Patient did complain of chest pain that is reproducible with palpation and worsens with deep inspiration. Today feels pain is gone. -Troponin peaked to 0.19, now back to 0.07 -Will continue to monitor closely -Echocardiogram pending -Continue aspirin, statin -Cardiology consulted and appreciated  Chronic diastolic heart failure -Echocardiogram 03/19/2015 showed an EF of 123456, grade 1 diastolic dysfunction -Currently appears to be euvolemic and compensated -BNP 215  Hypokalemia -Resolved  Essential hypertension -Continue amlodipine, hydralazine, Isordil  Diabetes mellitus, type II -metformin held -Was placed on ISS and CBG monitoring during hospitalization.  Chronic pain -Continue pain control when necessary  GERD -Continue PPI  Tobacco abuse -Smoking cessation discussed -nicotine patch  Consultants Cardiology  Procedures  None  Discharge Exam: Vitals:   11/03/16 1354 11/03/16 1503  BP: (!) 141/47 (!) 140/50  Pulse: 69   Resp: 18   Temp: 98.3 F (36.8 C)    No physical exam, patient left AMA  Discharge Instructions  Discharge Medication List as of 11/03/2016  5:01 PM    CONTINUE these medications which have NOT CHANGED   Details  albuterol (PROVENTIL HFA;VENTOLIN HFA) 108 (90 Base) MCG/ACT inhaler Inhale 1-2 puffs into the lungs every 6 (six) hours as needed for wheezing., Starting Thu 09/04/2016, Print    amLODipine (NORVASC) 10 MG tablet Take 10 mg by mouth daily., Until Discontinued, Historical Med    aspirin EC 81 MG tablet Take 81 mg by mouth daily., Until Discontinued, Historical Med    b complex vitamins  tablet Take 1 tablet by mouth daily., Until Discontinued, Historical Med    cyclobenzaprine  (FLEXERIL) 10 MG tablet Take 1 tablet (10 mg total) by mouth 3 (three) times daily as needed for muscle spasms., Starting 05/21/2016, Until Discontinued, Print    gabapentin (NEURONTIN) 100 MG capsule Take 100 mg by mouth 3 (three) times daily., Starting 06/10/2016, Until Discontinued, Historical Med    hydrALAZINE (APRESOLINE) 100 MG tablet Take 1 tablet (100 mg total) by mouth every 8 (eight) hours., Starting 01/30/2015, Until Discontinued, Normal    HYDROcodone-acetaminophen (NORCO/VICODIN) 5-325 MG tablet Take 1 tablet by mouth every 6 (six) hours as needed for moderate pain., Starting 05/21/2016, Until Discontinued, Print    hydrocortisone (ANUSOL-HC) 25 MG suppository Place 1 suppository (25 mg total) rectally 2 (two) times daily., Starting Thu 09/04/2016, Print    ibuprofen (ADVIL,MOTRIN) 800 MG tablet Take 800 mg by mouth every 8 (eight) hours as needed. pain, Starting 06/10/2016, Until Discontinued, Historical Med    isosorbide dinitrate (ISORDIL) 10 MG tablet Take 1 tablet (10 mg total) by mouth 3 (three) times daily., Starting 12/27/2013, Until Discontinued, Print    metFORMIN (GLUCOPHAGE) 500 MG tablet Take 500 mg by mouth 2 (two) times daily with a meal., Until Discontinued, Historical Med    naproxen (NAPROSYN) 375 MG tablet Take 1 tablet (375 mg total) by mouth 2 (two) times daily., Starting 01/03/2016, Until Discontinued, Print    ondansetron (ZOFRAN) 4 MG tablet Take 1 tablet (4 mg total) by mouth every 6 (six) hours., Starting 03/20/2016, Until Discontinued, Print    oxyCODONE-acetaminophen (PERCOCET/ROXICET) 5-325 MG tablet Take 1 tablet by mouth every 8 (eight) hours as needed for moderate pain. , Starting 06/10/2016, Until Discontinued, Historical Med    pantoprazole (PROTONIX) 20 MG tablet Take 1 tablet (20 mg total) by mouth daily., Starting 07/02/2015, Until Discontinued, Print    pravastatin (PRAVACHOL) 20 MG tablet Take 20 mg by mouth at bedtime., Until Discontinued, Historical  Med    predniSONE (DELTASONE) 20 MG tablet Take 1 tablet (20 mg total) by mouth 2 (two) times daily with a meal., Starting Thu 09/04/2016, Print    tamsulosin (FLOMAX) 0.4 MG CAPS capsule Take 1 capsule (0.4 mg total) by mouth daily., Starting Fri 07/18/2016, Print       No Known Allergies    The results of significant diagnostics from this hospitalization (including imaging, microbiology, ancillary and laboratory) are listed below for reference.    Significant Diagnostic Studies: Dg Chest 2 View  Result Date: 11/01/2016 CLINICAL DATA:  Chest dating radiating to the upper extremity for 2 days EXAM: CHEST  2 VIEW COMPARISON:  09/04/2016 FINDINGS: The lungs are clear. The pulmonary vasculature is normal. Heart size is normal. Hilar and mediastinal contours are unremarkable. There is no pleural effusion. IMPRESSION: No active cardiopulmonary disease. Electronically Signed   By: Andreas Newport M.D.   On: 11/01/2016 03:49    Microbiology: Recent Results (from the past 240 hour(s))  Culture, blood (routine x 2)     Status: None (Preliminary result)   Collection Time: 11/01/16  5:40 AM  Result Value Ref Range Status   Specimen Description BLOOD LEFT ARM  Final   Special Requests IN PEDIATRIC BOTTLE 4ML  Final   Culture NO GROWTH 3 DAYS  Final   Report Status PENDING  Incomplete  Culture, blood (routine x 2)     Status: None (Preliminary result)   Collection Time: 11/01/16  5:45 AM  Result Value Ref Range Status  Specimen Description BLOOD LEFT HAND  Final   Special Requests BOTTLES DRAWN AEROBIC AND ANAEROBIC 5ML  Final   Culture NO GROWTH 3 DAYS  Final   Report Status PENDING  Incomplete  Respiratory Panel by PCR     Status: None   Collection Time: 11/01/16  6:26 AM  Result Value Ref Range Status   Adenovirus NOT DETECTED NOT DETECTED Final   Coronavirus 229E NOT DETECTED NOT DETECTED Final   Coronavirus HKU1 NOT DETECTED NOT DETECTED Final   Coronavirus NL63 NOT DETECTED NOT  DETECTED Final   Coronavirus OC43 NOT DETECTED NOT DETECTED Final   Metapneumovirus NOT DETECTED NOT DETECTED Final   Rhinovirus / Enterovirus NOT DETECTED NOT DETECTED Final   Influenza A NOT DETECTED NOT DETECTED Final   Influenza B NOT DETECTED NOT DETECTED Final   Parainfluenza Virus 1 NOT DETECTED NOT DETECTED Final   Parainfluenza Virus 2 NOT DETECTED NOT DETECTED Final   Parainfluenza Virus 3 NOT DETECTED NOT DETECTED Final   Parainfluenza Virus 4 NOT DETECTED NOT DETECTED Final   Respiratory Syncytial Virus NOT DETECTED NOT DETECTED Final   Bordetella pertussis NOT DETECTED NOT DETECTED Final   Chlamydophila pneumoniae NOT DETECTED NOT DETECTED Final   Mycoplasma pneumoniae NOT DETECTED NOT DETECTED Final  Urine culture     Status: Abnormal   Collection Time: 11/01/16  7:39 AM  Result Value Ref Range Status   Specimen Description URINE, CLEAN CATCH  Final   Special Requests NONE  Final   Culture >=100,000 COLONIES/mL ESCHERICHIA COLI (A)  Final   Report Status 11/03/2016 FINAL  Final   Organism ID, Bacteria ESCHERICHIA COLI (A)  Final      Susceptibility   Escherichia coli - MIC*    AMPICILLIN >=32 RESISTANT Resistant     CEFAZOLIN >=64 RESISTANT Resistant     CEFTRIAXONE <=1 SENSITIVE Sensitive     CIPROFLOXACIN >=4 RESISTANT Resistant     GENTAMICIN <=1 SENSITIVE Sensitive     IMIPENEM <=0.25 SENSITIVE Sensitive     NITROFURANTOIN <=16 SENSITIVE Sensitive     TRIMETH/SULFA <=20 SENSITIVE Sensitive     AMPICILLIN/SULBACTAM >=32 RESISTANT Resistant     PIP/TAZO 64 INTERMEDIATE Intermediate     Extended ESBL NEGATIVE Sensitive     * >=100,000 COLONIES/mL ESCHERICHIA COLI     Labs: Basic Metabolic Panel:  Recent Labs Lab 11/01/16 0327 11/02/16 0305 11/03/16 0226  NA 128* 140 143  K 3.3* 4.0 4.2  CL 95* 110 111  CO2 21* 24 20*  GLUCOSE 183* 139* 96  BUN 17 25* 21*  CREATININE 1.06 0.97 1.00  CALCIUM 8.7* 8.9 8.7*   Liver Function Tests:  Recent Labs Lab  11/01/16 0536  AST 30  ALT 21  ALKPHOS 64  BILITOT 0.7  PROT 8.4*  ALBUMIN 3.2*   No results for input(s): LIPASE, AMYLASE in the last 168 hours. No results for input(s): AMMONIA in the last 168 hours. CBC:  Recent Labs Lab 11/01/16 0327 11/02/16 0305 11/02/16 0728 11/03/16 0226  WBC 20.8* 30.4* 34.0* 18.7*  HGB 11.0* 10.6* 10.2* 11.4*  HCT 34.3* 32.9* 32.1* 33.4*  MCV 70.9* 70.8* 70.7* 70.9*  PLT 222 196 213 194   Cardiac Enzymes:  Recent Labs Lab 11/01/16 0859 11/01/16 1316 11/01/16 1559 11/01/16 2158 11/02/16 0305  TROPONINI 0.04* 0.19* 0.05* 0.10* 0.07*   BNP: BNP (last 3 results)  Recent Labs  01/02/16 2348 11/01/16 0859  BNP 64.2 215.6*    ProBNP (last 3 results) No results  for input(s): PROBNP in the last 8760 hours.  CBG:  Recent Labs Lab 11/02/16 1639 11/02/16 2126 11/03/16 0649 11/03/16 1131 11/03/16 1606  GLUCAP 165* 105* 111* 180* 164*       Signed:  Mallissa Lorenzen  [facility] 11/04/2016, 3:10 PM

## 2016-11-06 LAB — CULTURE, BLOOD (ROUTINE X 2)
CULTURE: NO GROWTH
Culture: NO GROWTH

## 2016-11-16 IMAGING — CT CT ANGIO CHEST
1 of 4 series · 18 of 30 positions shown · IV contrast (OMNIPAQUE 350)
Comparison: 08/15/2014

CLINICAL DATA: Midsternal chest pain and lightheadedness since
yesterday. Pleuritic pain.

EXAM:
CT ANGIOGRAPHY CHEST WITH CONTRAST
TECHNIQUE: Multidetector CT imaging of the chest was performed using the
standard protocol during bolus administration of intravenous
contrast. Multiplanar CT image reconstructions and MIPs were
obtained to evaluate the vascular anatomy.
CONTRAST:  100mL OMNIPAQUE IOHEXOL 350 MG/ML SOLN

[Series 11: thins for pacs · axial · 0.74mm/px · z∈[+10,+228]mm · 18 of 246 slices shown]
[im 14/246  lung]
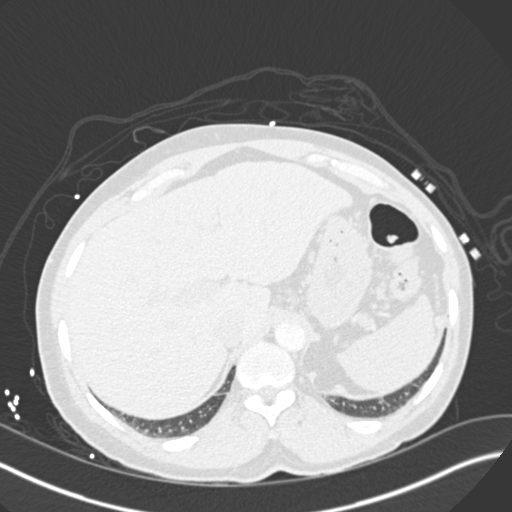
[im 28/246  mediastinal]
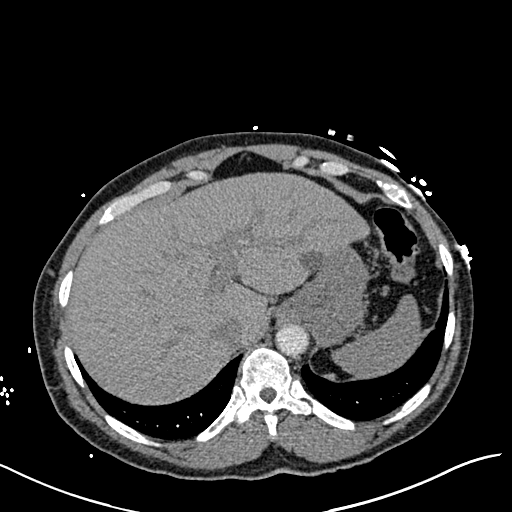
[im 41/246  lung]
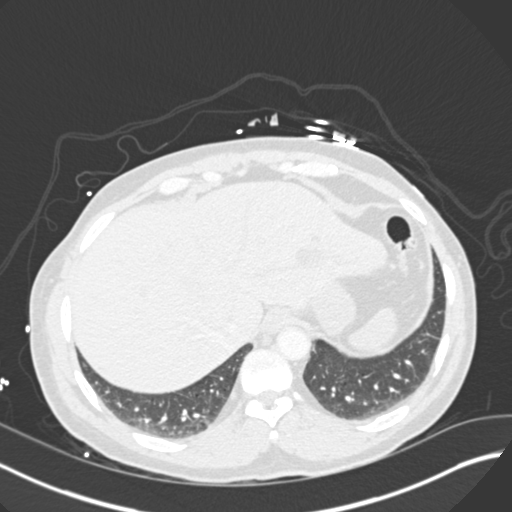
[im 55/246  mediastinal]
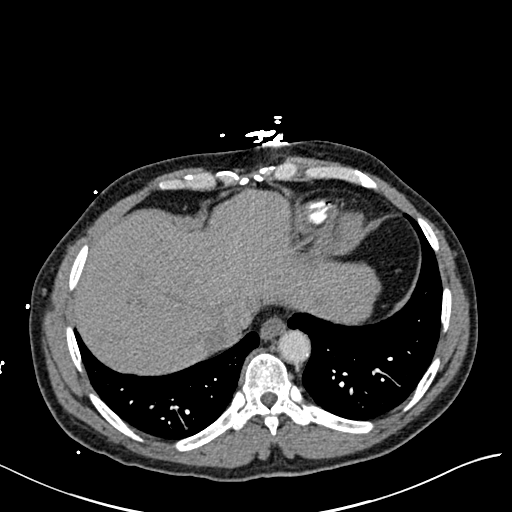
[im 69/246  lung]
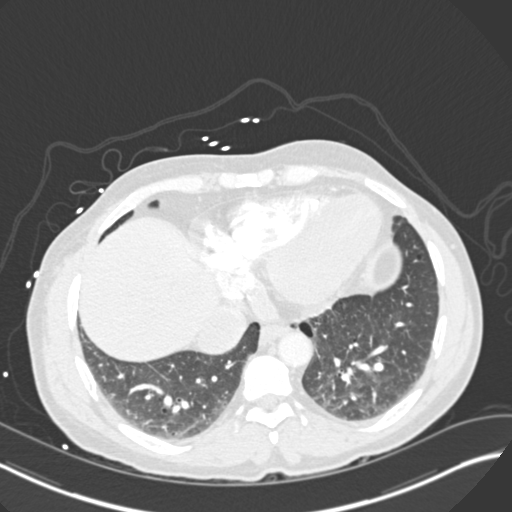
[im 82/246  mediastinal]
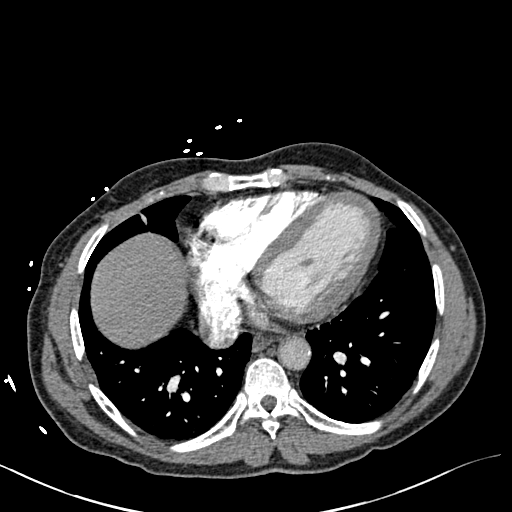
[im 96/246  lung]
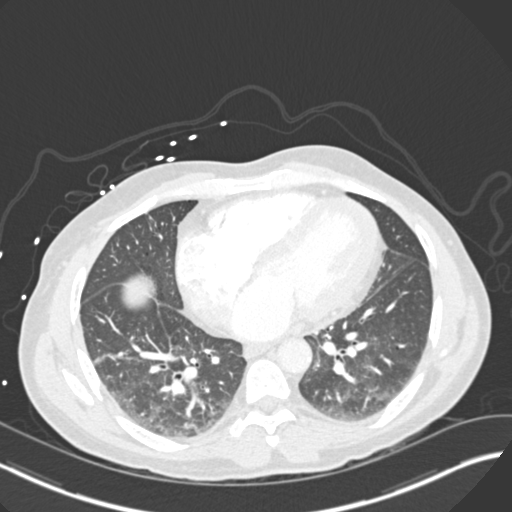
[im 109/246  mediastinal]
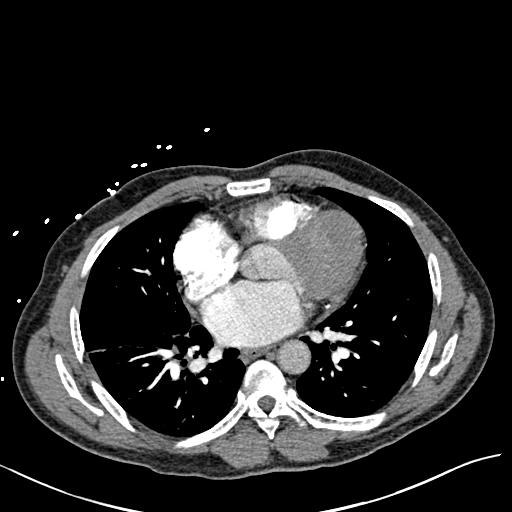
[im 118/246  lung]
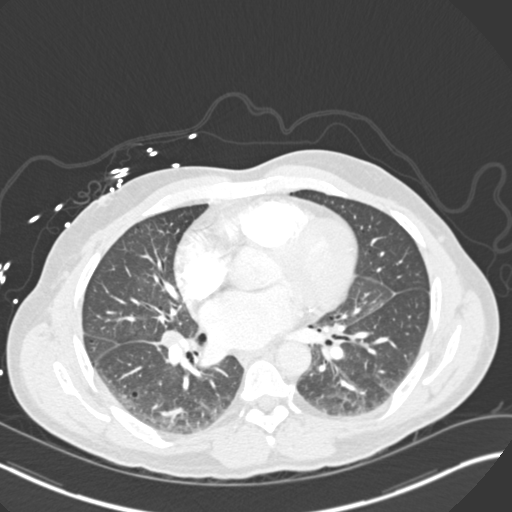
[im 123/246  mediastinal]
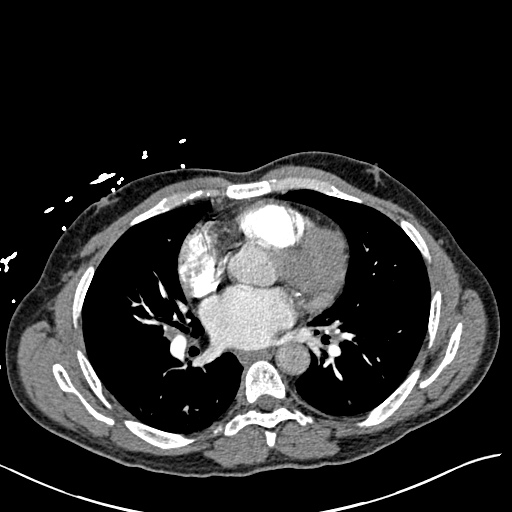
[im 137/246  lung]
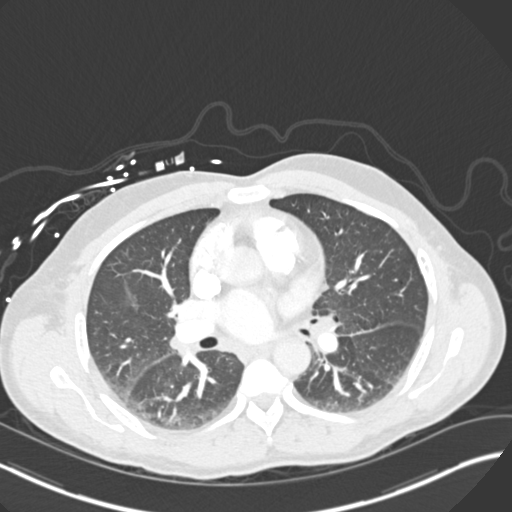
[im 150/246  mediastinal]
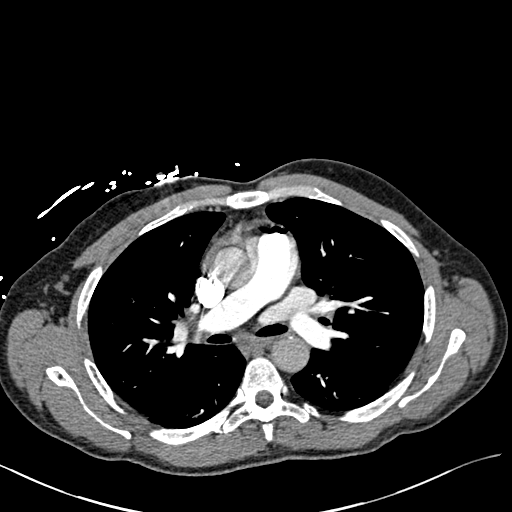
[im 164/246  lung]
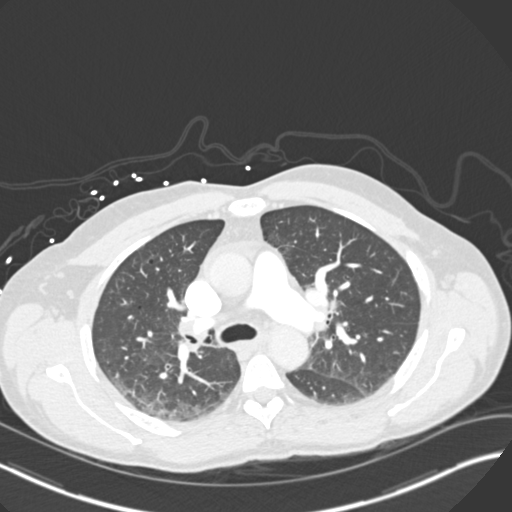
[im 177/246  mediastinal]
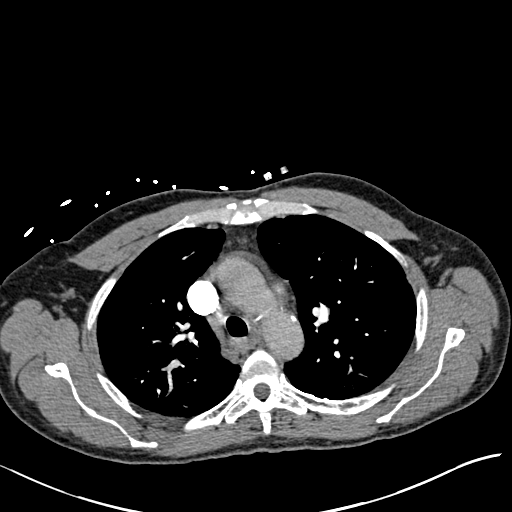
[im 191/246  lung]
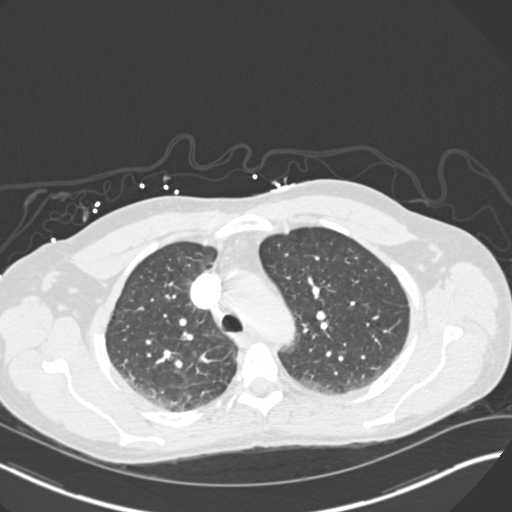
[im 205/246  mediastinal]
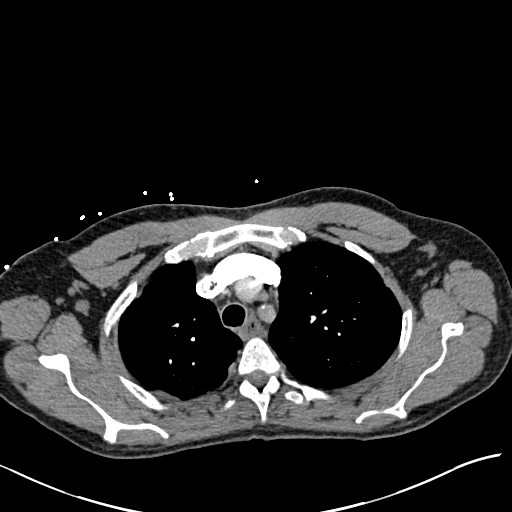
[im 218/246  lung]
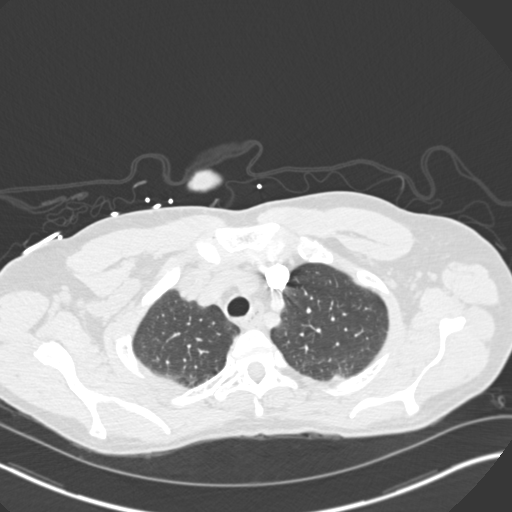
[im 232/246  mediastinal]
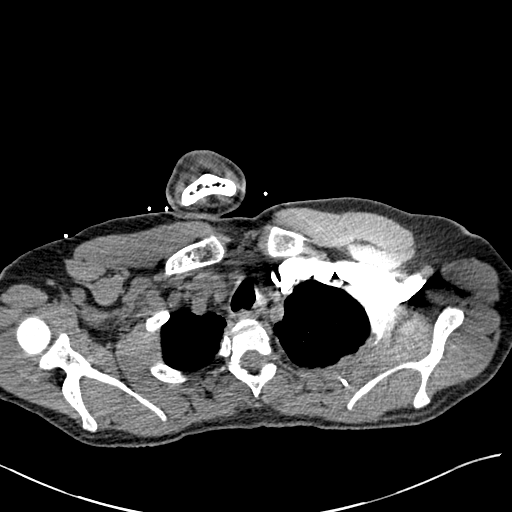

[18 of 30 positions shown; findings below may reference images not displayed]

FINDINGS: Cardiovascular: There is good opacification of the pulmonary
vasculature. There is no pulmonary embolism. The thoracic aorta is
normal in caliber and intact.

Lungs: Mild emphysematous blebs are present in the upper lobe
periphery bilaterally. Dependent posterior base opacities are
present in both lungs, likely a combination of mild atelectasis and
mild fibrotic changes.

Central airways: Patent

Effusions: None

Lymphadenopathy: None

Esophagus: Unremarkable

Upper abdomen: No acute findings

Musculoskeletal: No significant abnormality

Review of the MIP images confirms the above findings.
IMPRESSION: Negative for pulmonary embolism. Mild upper lobe emphysematous blebs
are present.

## 2016-11-19 IMAGING — CT CT ABD-PELV W/ CM
1 series · 15 of 32 positions shown, 19 images · IV contrast (OMNIPAQUE)
Comparison: CT abdomen and pelvis 11/03/2014, 10/17/2013,
05/15/2014 and 08/15/2014.

CLINICAL DATA: Abdominal pain and leukocytosis. Urinary tract
infection. The patient recently hospitalized from 01/02/2015 and
01/05/2015 for pyelonephritis versus prostatitis.

EXAM:
CT ABDOMEN AND PELVIS WITH CONTRAST
TECHNIQUE: Multidetector CT imaging of the abdomen and pelvis was performed
using the standard protocol following bolus administration of
intravenous contrast.
CONTRAST:  100 mL OMNIPAQUE IOHEXOL 300 MG/ML  SOLN

[Series 5: lung windows · axial · 0.74mm/px · z∈[+808,+1234]mm · 15 of 96 slices shown, 19 images]
[im 7/96  soft-tissue]
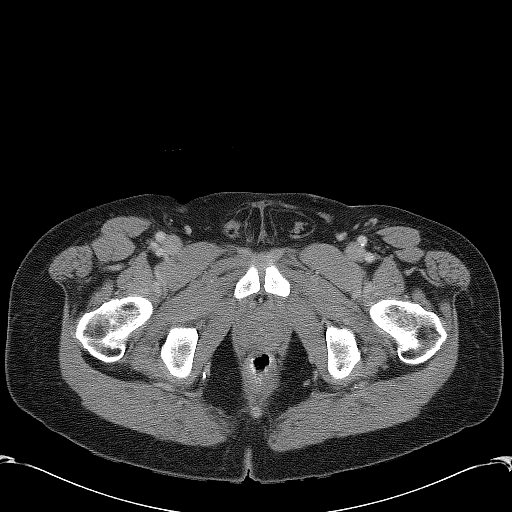
[im 7/96  bone]
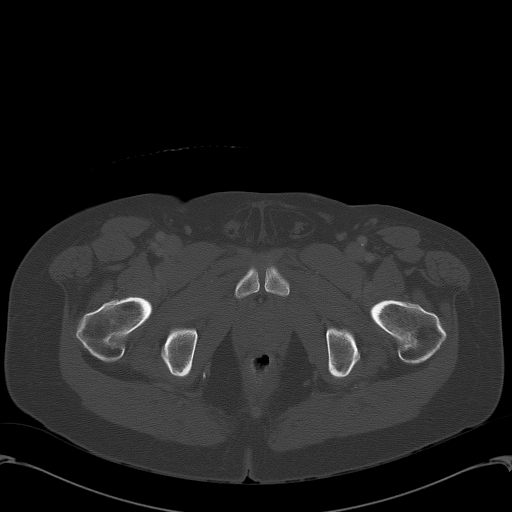
[im 13/96  soft-tissue]
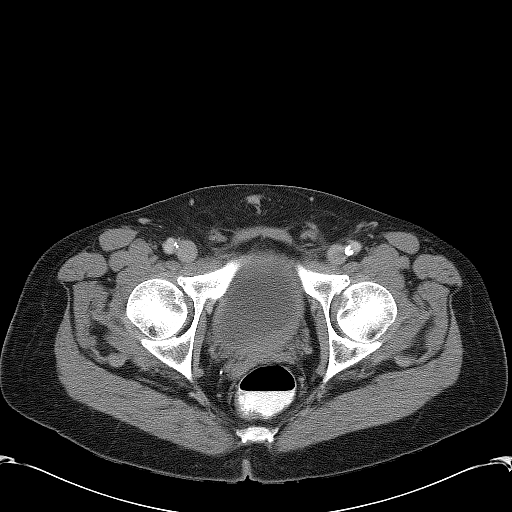
[im 19/96  soft-tissue]
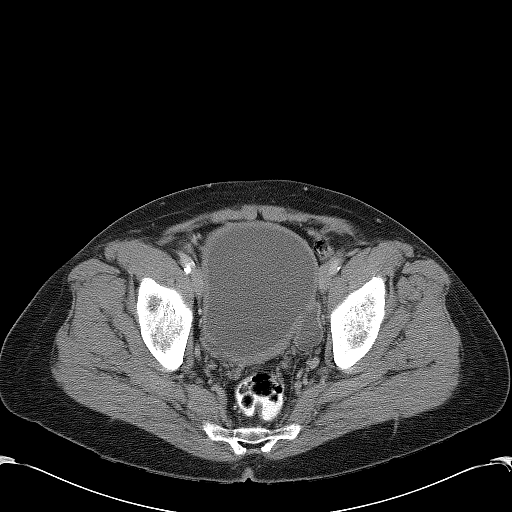
[im 28/96  soft-tissue]
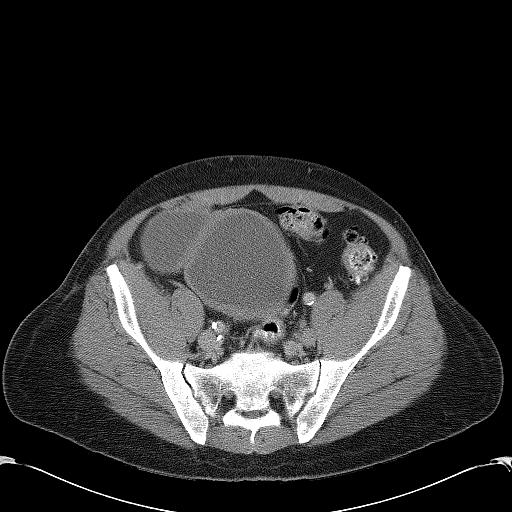
[im 34/96  soft-tissue]
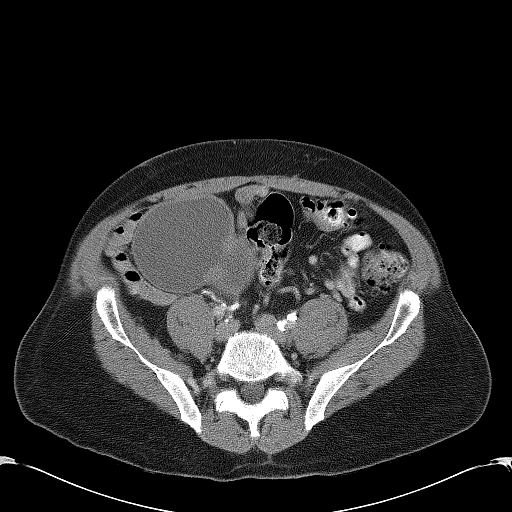
[im 40/96  soft-tissue]
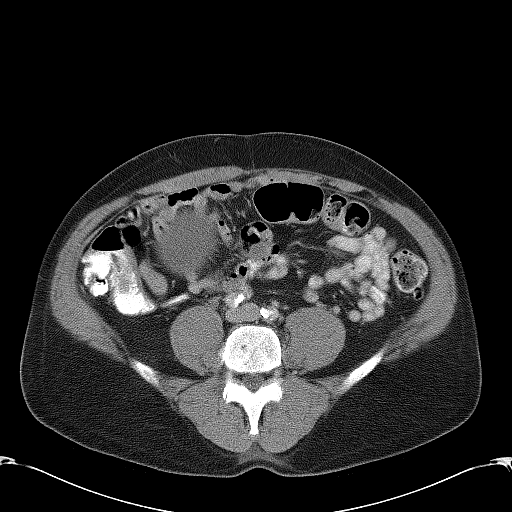
[im 50/96  soft-tissue]
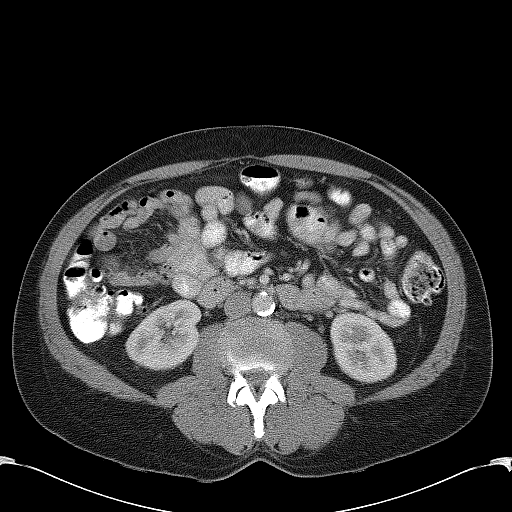
[im 56/96  soft-tissue]
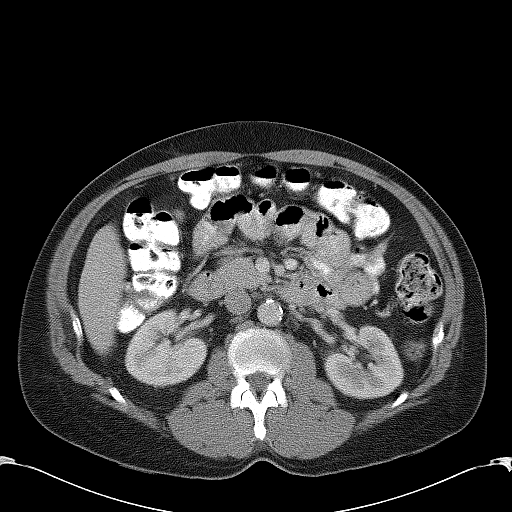
[im 62/96  soft-tissue]
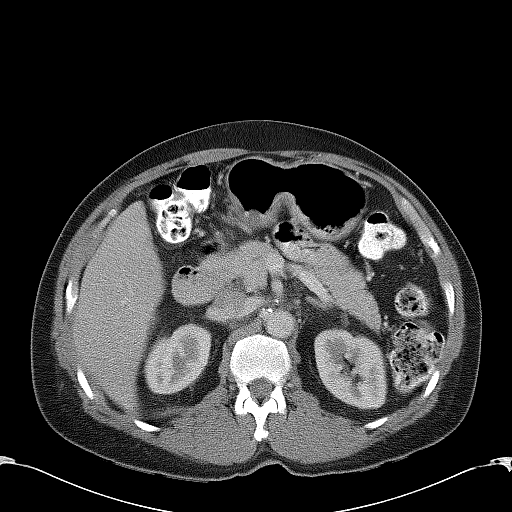
[im 62/96  bone]
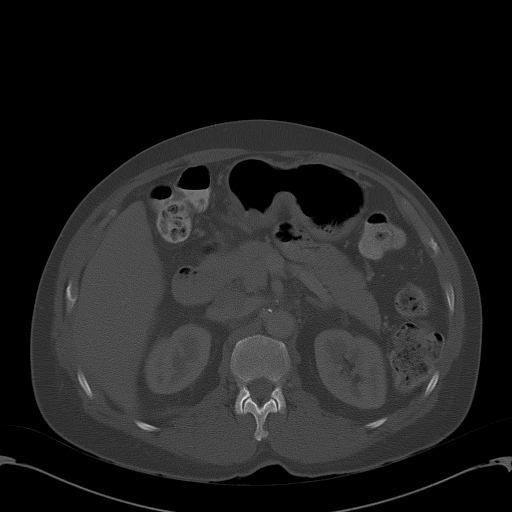
[im 68/96  soft-tissue]
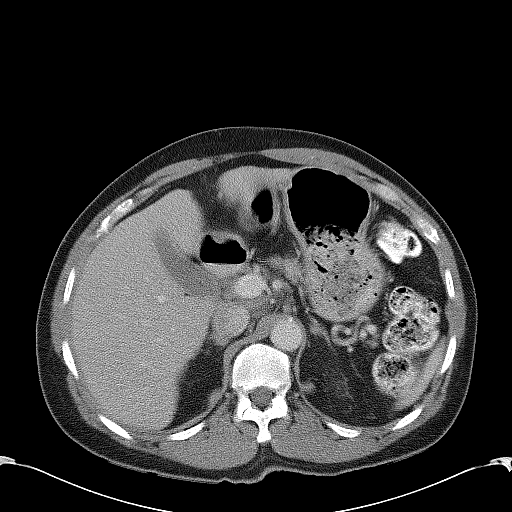
[im 77/96  soft-tissue]
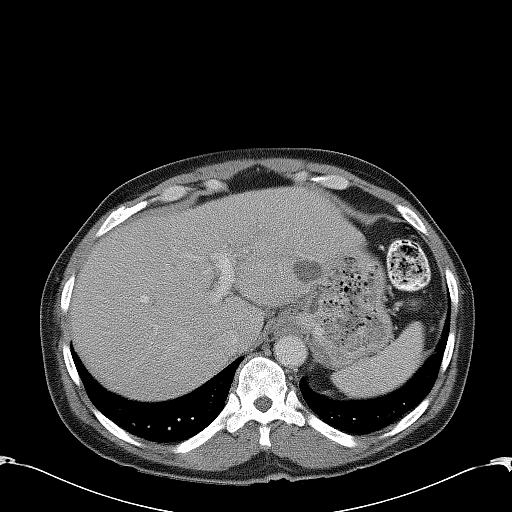
[im 83/96  soft-tissue]
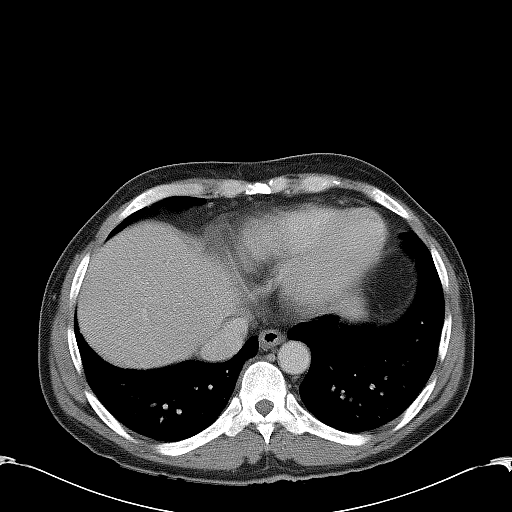
[im 83/96  lung]
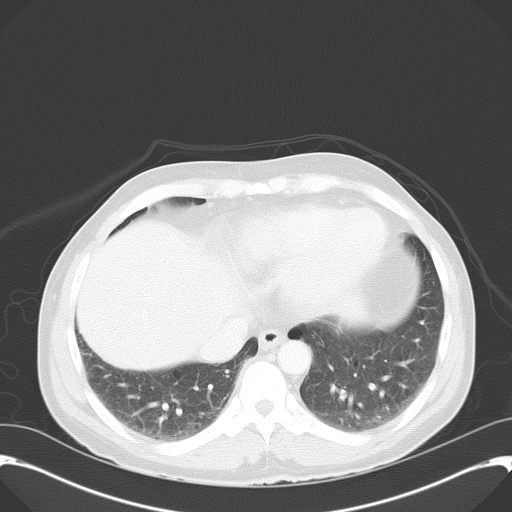
[im 86/96  lung]
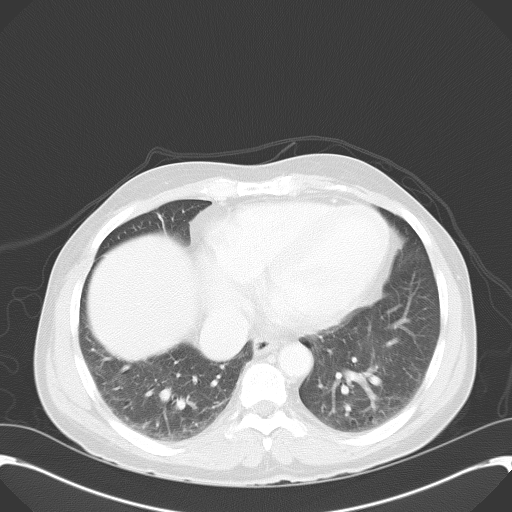
[im 89/96  soft-tissue]
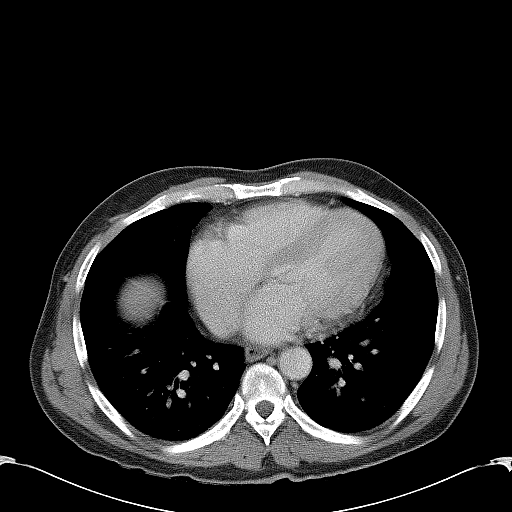
[im 89/96  lung]
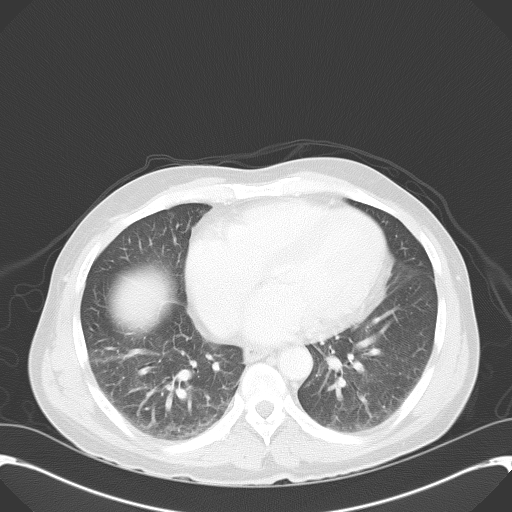
[im 92/96  lung]
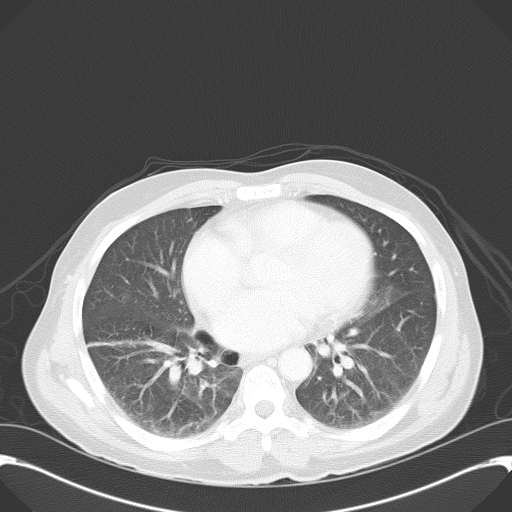

[15 of 32 positions shown; findings below may reference images not displayed]

FINDINGS: The lung bases demonstrate dependent atelectasis. There is
cardiomegaly. Calcific coronary artery disease is seen. No pleural
or pericardial effusion.

The kidneys demonstrate normal and symmetric enhancement. Chronic
thickening of the urinary bladder wall with large diverticula
present does not appear notably changed. The prostate gland is
unremarkable. Seminal vesicles appear normal.

1.9 cm lesion in the left hepatic lobe is compatible with a small
cyst or hemangioma and unchanged. The liver otherwise appears
normal. The gallbladder, adrenal glands, spleen, pancreas and
biliary tree are unremarkable. Colonic diverticulosis is most
notable in the sigmoid but no evidence diverticulitis is seen. The
stomach, small bowel and appendix appear normal. Aortoiliac
atherosclerosis without aneurysm is identified. No lytic or
sclerotic bony lesion is seen.
IMPRESSION: No acute abnormality.

Chronic thickening of the urinary bladder wall with multiple bladder
diverticula. The appearance is unchanged.

Calcific aortic and coronary atherosclerosis.

Diverticulosis without diverticulitis.

## 2017-01-22 ENCOUNTER — Emergency Department (HOSPITAL_COMMUNITY): Payer: Medicaid Other

## 2017-01-22 ENCOUNTER — Inpatient Hospital Stay (HOSPITAL_COMMUNITY)
Admission: EM | Admit: 2017-01-22 | Discharge: 2017-01-24 | DRG: 312 | Discharge status: Against Medical Advice | Payer: Medicaid Other | Attending: Internal Medicine | Admitting: Internal Medicine

## 2017-01-22 ENCOUNTER — Encounter (HOSPITAL_COMMUNITY): Payer: Self-pay

## 2017-01-22 DIAGNOSIS — I421 Obstructive hypertrophic cardiomyopathy: Secondary | ICD-10-CM | POA: Diagnosis present

## 2017-01-22 DIAGNOSIS — Z833 Family history of diabetes mellitus: Secondary | ICD-10-CM

## 2017-01-22 DIAGNOSIS — Z791 Long term (current) use of non-steroidal anti-inflammatories (NSAID): Secondary | ICD-10-CM

## 2017-01-22 DIAGNOSIS — G8929 Other chronic pain: Secondary | ICD-10-CM | POA: Diagnosis present

## 2017-01-22 DIAGNOSIS — N390 Urinary tract infection, site not specified: Secondary | ICD-10-CM | POA: Diagnosis present

## 2017-01-22 DIAGNOSIS — I110 Hypertensive heart disease with heart failure: Secondary | ICD-10-CM | POA: Diagnosis present

## 2017-01-22 DIAGNOSIS — Z7982 Long term (current) use of aspirin: Secondary | ICD-10-CM

## 2017-01-22 DIAGNOSIS — R55 Syncope and collapse: Principal | ICD-10-CM | POA: Diagnosis present

## 2017-01-22 DIAGNOSIS — I69354 Hemiplegia and hemiparesis following cerebral infarction affecting left non-dominant side: Secondary | ICD-10-CM

## 2017-01-22 DIAGNOSIS — M549 Dorsalgia, unspecified: Secondary | ICD-10-CM | POA: Diagnosis present

## 2017-01-22 DIAGNOSIS — I10 Essential (primary) hypertension: Secondary | ICD-10-CM | POA: Diagnosis present

## 2017-01-22 DIAGNOSIS — Z7984 Long term (current) use of oral hypoglycemic drugs: Secondary | ICD-10-CM

## 2017-01-22 DIAGNOSIS — F17210 Nicotine dependence, cigarettes, uncomplicated: Secondary | ICD-10-CM | POA: Diagnosis present

## 2017-01-22 DIAGNOSIS — Z7952 Long term (current) use of systemic steroids: Secondary | ICD-10-CM

## 2017-01-22 DIAGNOSIS — D352 Benign neoplasm of pituitary gland: Secondary | ICD-10-CM | POA: Diagnosis present

## 2017-01-22 DIAGNOSIS — E119 Type 2 diabetes mellitus without complications: Secondary | ICD-10-CM

## 2017-01-22 DIAGNOSIS — I5032 Chronic diastolic (congestive) heart failure: Secondary | ICD-10-CM | POA: Diagnosis present

## 2017-01-22 DIAGNOSIS — W19XXXA Unspecified fall, initial encounter: Secondary | ICD-10-CM

## 2017-01-22 DIAGNOSIS — Z8249 Family history of ischemic heart disease and other diseases of the circulatory system: Secondary | ICD-10-CM

## 2017-01-22 DIAGNOSIS — I5033 Acute on chronic diastolic (congestive) heart failure: Secondary | ICD-10-CM | POA: Diagnosis present

## 2017-01-22 DIAGNOSIS — R509 Fever, unspecified: Secondary | ICD-10-CM

## 2017-01-22 LAB — URINALYSIS, ROUTINE W REFLEX MICROSCOPIC
BILIRUBIN URINE: NEGATIVE
Glucose, UA: NEGATIVE mg/dL
HGB URINE DIPSTICK: NEGATIVE
Ketones, ur: NEGATIVE mg/dL
Nitrite: NEGATIVE
PH: 6 (ref 5.0–8.0)
Protein, ur: NEGATIVE mg/dL
SPECIFIC GRAVITY, URINE: 1.013 (ref 1.005–1.030)

## 2017-01-22 LAB — CBC
HCT: 38.2 % — ABNORMAL LOW (ref 39.0–52.0)
Hemoglobin: 12.7 g/dL — ABNORMAL LOW (ref 13.0–17.0)
MCH: 23.5 pg — ABNORMAL LOW (ref 26.0–34.0)
MCHC: 33.2 g/dL (ref 30.0–36.0)
MCV: 70.6 fL — ABNORMAL LOW (ref 78.0–100.0)
PLATELETS: 361 10*3/uL (ref 150–400)
RBC: 5.41 MIL/uL (ref 4.22–5.81)
RDW: 15.3 % (ref 11.5–15.5)
WBC: 14.6 10*3/uL — ABNORMAL HIGH (ref 4.0–10.5)

## 2017-01-22 LAB — BASIC METABOLIC PANEL
Anion gap: 12 (ref 5–15)
BUN: 13 mg/dL (ref 6–20)
CALCIUM: 9.8 mg/dL (ref 8.9–10.3)
CO2: 23 mmol/L (ref 22–32)
CREATININE: 1.03 mg/dL (ref 0.61–1.24)
Chloride: 103 mmol/L (ref 101–111)
GFR calc non Af Amer: >60 mL/min (ref >60)
Glucose, Bld: 138 mg/dL — ABNORMAL HIGH (ref 65–99)
Potassium: 3.5 mmol/L (ref 3.5–5.1)
SODIUM: 138 mmol/L (ref 135–145)

## 2017-01-22 MED ORDER — HYDROCODONE-ACETAMINOPHEN 5-325 MG PO TABS
1.0000 | ORAL_TABLET | Freq: Once | ORAL | Status: AC
  Administered 2017-01-22: 1 via ORAL
  Filled 2017-01-22: qty 1

## 2017-01-22 NOTE — ED Triage Notes (Signed)
Pt states that he became dizzy and fell about an hour ago, + LOC, hit head on floor, pt takes a baby ASA everyday. Also c/o of R wrist pain, no deformity noted, positive PMS

## 2017-01-22 NOTE — ED Provider Notes (Signed)
[facility] DEPT Provider Note   CSN: [id number] Arrival date & time: 01/22/17  1909     History   Chief Complaint Chief Complaint  Patient presents with  . Fall    HPI [name] is a 66 y.o. male.  Patient with past medical history of stroke, diabetes, hypertension, CHF, prior brain tumor presents to the emergency department with chief complaint of syncopal episode. Patient states that he was walking to the kitchen, turned off the stove, turned around, and collapsed. He denies feeling dizzy prior to passing out. He denies any chest pain, shortness of breath, weakness, numbness, or tingling after regaining consciousness. He states that immediately after regaining consciousness, he was able to ambulate to get his phone to all EMS. Currently he complains only of mild headache and right wrist pain. He denies any other associated symptoms. He has not taken anything for her symptoms. His right wrist pain is exacerbated with movement and palpation. Otherwise, there are no modifying factors.   The history is provided by the patient. No language interpreter was used.    Past Medical History:  Diagnosis Date  . Alcohol dependence ([place]) 12/10/2013  . Angioedema of lips 12/24/2013  . Brain tumor ([place])   . CHF (congestive heart failure) ([facility])    diastolic  . Chronic back pain   . Chronic headache   . CVA (cerebral vascular accident) ([place])   . Diabetes mellitus   . HTN (hypertension), malignant 09/06/2012  . Hypertension   . Narcotic abuse   . Normal cardiac stress test 2009  . TIA (transient ischemic attack) 09/06/2012  . Tobacco use disorder 12/10/2013  . Vertigo 09/06/2012    Patient Active Problem List   Diagnosis Date Noted  . Elevated troponin   . Nausea 11/01/2016  . Dysuria 11/01/2016  . Chronic diastolic heart failure ([place]) 03/20/2015  . Chest pain 03/18/2015  . History of TIA (transient ischemic attack) 01/27/2015  . Enterococcus UTI 01/27/2015  . Cough 01/27/2015   . Bronchitis 01/27/2015  . HCAP (healthcare-associated pneumonia)   . CHF (congestive heart failure) ([place]) 01/26/2015  . History of alcohol abuse 01/26/2015  . Pyelonephritis 01/12/2015  . Prostatitis 01/12/2015  . Essential hypertension 11/03/2014  . Hypokalemia 11/03/2014  . Fever 08/15/2014  . Chronic pain 12/24/2013  . Tobacco use disorder 12/10/2013  . Leukocytosis 12/10/2013  . Diabetes mellitus type 2, controlled ([place]) 09/06/2012  . Pain in the side 09/06/2012  . Pituitary adenoma ([place]) 09/06/2012    Past Surgical History:  Procedure Laterality Date  . NO PAST SURGERIES         Home Medications    Prior to Admission medications   Medication Sig Start Date End Date Taking? Authorizing Provider  albuterol (PROVENTIL HFA;VENTOLIN HFA) 108 (90 Base) MCG/ACT inhaler Inhale 1-2 puffs into the lungs every 6 (six) hours as needed for wheezing. 09/04/16   Tanna Furry, MD  amLODipine (NORVASC) 10 MG tablet Take 10 mg by mouth daily.    Historical Provider, MD  aspirin EC 81 MG tablet Take 81 mg by mouth daily.    Historical Provider, MD  b complex vitamins tablet Take 1 tablet by mouth daily.    Historical Provider, MD  cyclobenzaprine (FLEXERIL) 10 MG tablet Take 1 tablet (10 mg total) by mouth 3 (three) times daily as needed for muscle spasms. 05/21/16   Dalia Heading, PA-C  gabapentin (NEURONTIN) 100 MG capsule Take 100 mg by mouth 3 (three) times daily. 06/10/16   Historical Provider,  MD  hydrALAZINE (APRESOLINE) 100 MG tablet Take 1 tablet (100 mg total) by mouth every 8 (eight) hours. 01/30/15   Reyne Dumas, MD  HYDROcodone-acetaminophen (NORCO/VICODIN) 5-325 MG tablet Take 1 tablet by mouth every 6 (six) hours as needed for moderate pain. 05/21/16   Dalia Heading, PA-C  hydrocortisone (ANUSOL-HC) 25 MG suppository Place 1 suppository (25 mg total) rectally 2 (two) times daily. 09/04/16   Tanna Furry, MD  ibuprofen (ADVIL,MOTRIN) 800 MG tablet Take 800 mg by mouth every 8  (eight) hours as needed. pain 06/10/16   Historical Provider, MD  isosorbide dinitrate (ISORDIL) 10 MG tablet Take 1 tablet (10 mg total) by mouth 3 (three) times daily. 12/27/13   Debbe Odea, MD  metFORMIN (GLUCOPHAGE) 500 MG tablet Take 500 mg by mouth 2 (two) times daily with a meal.    Historical Provider, MD  naproxen (NAPROSYN) 375 MG tablet Take 1 tablet (375 mg total) by mouth 2 (two) times daily. 01/03/16   [name], MD  ondansetron (ZOFRAN) 4 MG tablet Take 1 tablet (4 mg total) by mouth every 6 (six) hours. Patient taking differently: Take 4 mg by mouth every 8 (eight) hours as needed for nausea.  03/20/16   Montine Circle, PA-C  oxyCODONE-acetaminophen (PERCOCET/ROXICET) 5-325 MG tablet Take 1 tablet by mouth every 8 (eight) hours as needed for moderate pain.  06/10/16   Historical Provider, MD  pantoprazole (PROTONIX) 20 MG tablet Take 1 tablet (20 mg total) by mouth daily. 07/02/15   [name], MD  pravastatin (PRAVACHOL) 20 MG tablet Take 20 mg by mouth at bedtime.    Historical Provider, MD  predniSONE (DELTASONE) 20 MG tablet Take 1 tablet (20 mg total) by mouth 2 (two) times daily with a meal. 09/04/16   Tanna Furry, MD  tamsulosin (FLOMAX) 0.4 MG CAPS capsule Take 1 capsule (0.4 mg total) by mouth daily. 07/18/16   Lahoma Crocker Barrett, PA-C    Family History Family History  Problem Relation Age of Onset  . CAD Father     "heart attack in his 74's"  . Hypertension Father   . Diabetes Brother   . Diabetes Sister   . Hypertension Mother     Social History Social History  Substance Use Topics  . Smoking status: Current Every Day Smoker    Packs/day: 0.50    Types: Cigarettes  . Smokeless tobacco: Current User  . Alcohol use Yes     Allergies   Patient has no known allergies.   Review of Systems Review of Systems  Musculoskeletal: Positive for arthralgias.  Neurological: Positive for syncope.  All other systems reviewed and are negative.    Physical  Exam Updated Vital Signs BP 144/61   Pulse 64   Temp 99.3 F (37.4 C) (Oral)   Resp 12   Ht 5\' 9"  (1.753 m)   Wt 68.2 kg   SpO2 98%   BMI 22.22 kg/m   Physical Exam  Constitutional: He is oriented to person, place, and time. He appears well-developed and well-nourished.  HENT:  Head: Normocephalic and atraumatic.  Eyes: Conjunctivae and EOM are normal. Pupils are equal, round, and reactive to light. Right eye exhibits no discharge. Left eye exhibits no discharge. No scleral icterus.  Neck: Normal range of motion. Neck supple. No JVD present.  Cardiovascular: Normal rate, regular rhythm and normal heart sounds.  Exam reveals no gallop and no friction rub.   No murmur heard. Pulmonary/Chest: Effort normal and breath sounds normal. No respiratory distress.  He has no wheezes. He has no rales. He exhibits no tenderness.  Abdominal: Soft. He exhibits no distension and no mass. There is no tenderness. There is no rebound and no guarding.  Musculoskeletal: Normal range of motion. He exhibits no edema or tenderness.  Neurological: He is alert and oriented to person, place, and time.  Skin: Skin is warm and dry.  Psychiatric: He has a normal mood and affect. His behavior is normal. Judgment and thought content normal.  Nursing note and vitals reviewed.    ED Treatments / Results  Labs (all labs ordered are listed, but only abnormal results are displayed) Labs Reviewed  BASIC METABOLIC PANEL - Abnormal; Notable for the following:       Result Value   Glucose, Bld 138 (*)    All other components within normal limits  CBC - Abnormal; Notable for the following:    WBC 14.6 (*)    Hemoglobin 12.7 (*)    HCT 38.2 (*)    MCV 70.6 (*)    MCH 23.5 (*)    All other components within normal limits  URINALYSIS, ROUTINE W REFLEX MICROSCOPIC - Abnormal; Notable for the following:    Color, Urine AMBER (*)    APPearance CLOUDY (*)    Leukocytes, UA LARGE (*)    Bacteria, UA MANY (*)     Squamous Epithelial / LPF 6-30 (*)    All other components within normal limits  CBG MONITORING, ED    EKG  EKG Interpretation None       Radiology Ct Head Wo Contrast  Result Date: 01/22/2017 CLINICAL DATA:  Loss of consciousness, striking the right side of the head on the floor. Headache. Brain tumor. EXAM: CT HEAD WITHOUT CONTRAST TECHNIQUE: Contiguous axial images were obtained from the base of the skull through the vertex without intravenous contrast. COMPARISON:  03/09/2016.  11/03/2014. FINDINGS: Brain: Normal appearance without evidence of atrophy, old or acute infarction, intra-axial mass lesion, hemorrhage, hydrocephalus or extra-axial collection. Previously seen pituitary adenoma cannot be resolved on this study. No suprasellar mass. Vascular: Normal Skull: Normal Sinuses/Orbits: Clear/normal Other: None significant IMPRESSION: No acute or traumatic finding. Normal examination. No visible pituitary mass on this exam. Electronically Signed   By: Nelson Chimes M.D.   On: 01/22/2017 19:47    Procedures Procedures (including critical care time)  Medications Ordered in ED Medications  HYDROcodone-acetaminophen (NORCO/VICODIN) 5-325 MG per tablet 1 tablet (1 tablet Oral Given 01/22/17 2225)     Initial Impression / Assessment and Plan / ED Course  I have reviewed the triage vital signs and the nursing notes.  Pertinent labs & imaging results that were available during my care of the patient were reviewed by me and considered in my medical decision making (see chart for details).     Patient with syncopal episode this evening. Patient did not feel dizzy or lightheaded prior to passing out. He also denies having felt any chest pain or shortness of breath. Currently only complains of right wrist pain and mild headache. EKG shows prolonged QT.  Doesn't appear to be on any common QT prolonging meds.  Patient discussed with Dr. Tomi Bamberger, who agrees with plan for observation admission  for syncope.  Appreciate Dr. Hal Hope for admitting the patient for overnight observation.  Final Clinical Impressions(s) / ED Diagnoses   Final diagnoses:  Syncope, unspecified syncope type    New Prescriptions New Prescriptions   No medications on file     Montine Circle, PA-C 01/23/17  [id number]    Dorie Rank, MD 01/23/17 1218

## 2017-01-22 NOTE — ED Notes (Signed)
Patient transported to X-ray 

## 2017-01-23 ENCOUNTER — Observation Stay (HOSPITAL_BASED_OUTPATIENT_CLINIC_OR_DEPARTMENT_OTHER): Payer: Medicaid Other

## 2017-01-23 ENCOUNTER — Encounter (HOSPITAL_COMMUNITY): Payer: Self-pay | Admitting: Internal Medicine

## 2017-01-23 ENCOUNTER — Observation Stay (HOSPITAL_COMMUNITY): Payer: Medicaid Other

## 2017-01-23 DIAGNOSIS — E118 Type 2 diabetes mellitus with unspecified complications: Secondary | ICD-10-CM

## 2017-01-23 DIAGNOSIS — N390 Urinary tract infection, site not specified: Secondary | ICD-10-CM | POA: Diagnosis present

## 2017-01-23 DIAGNOSIS — E119 Type 2 diabetes mellitus without complications: Secondary | ICD-10-CM | POA: Diagnosis present

## 2017-01-23 DIAGNOSIS — R55 Syncope and collapse: Secondary | ICD-10-CM | POA: Diagnosis present

## 2017-01-23 DIAGNOSIS — Z7982 Long term (current) use of aspirin: Secondary | ICD-10-CM | POA: Diagnosis not present

## 2017-01-23 DIAGNOSIS — Z7952 Long term (current) use of systemic steroids: Secondary | ICD-10-CM | POA: Diagnosis not present

## 2017-01-23 DIAGNOSIS — D352 Benign neoplasm of pituitary gland: Secondary | ICD-10-CM | POA: Diagnosis present

## 2017-01-23 DIAGNOSIS — Z7984 Long term (current) use of oral hypoglycemic drugs: Secondary | ICD-10-CM | POA: Diagnosis not present

## 2017-01-23 DIAGNOSIS — I5032 Chronic diastolic (congestive) heart failure: Secondary | ICD-10-CM | POA: Diagnosis present

## 2017-01-23 DIAGNOSIS — Z8249 Family history of ischemic heart disease and other diseases of the circulatory system: Secondary | ICD-10-CM | POA: Diagnosis not present

## 2017-01-23 DIAGNOSIS — F17210 Nicotine dependence, cigarettes, uncomplicated: Secondary | ICD-10-CM | POA: Diagnosis present

## 2017-01-23 DIAGNOSIS — Z791 Long term (current) use of non-steroidal anti-inflammatories (NSAID): Secondary | ICD-10-CM | POA: Diagnosis not present

## 2017-01-23 DIAGNOSIS — Z833 Family history of diabetes mellitus: Secondary | ICD-10-CM | POA: Diagnosis not present

## 2017-01-23 DIAGNOSIS — I421 Obstructive hypertrophic cardiomyopathy: Secondary | ICD-10-CM | POA: Diagnosis present

## 2017-01-23 DIAGNOSIS — M549 Dorsalgia, unspecified: Secondary | ICD-10-CM | POA: Diagnosis present

## 2017-01-23 DIAGNOSIS — I10 Essential (primary) hypertension: Secondary | ICD-10-CM | POA: Diagnosis not present

## 2017-01-23 DIAGNOSIS — G8929 Other chronic pain: Secondary | ICD-10-CM | POA: Diagnosis present

## 2017-01-23 DIAGNOSIS — I110 Hypertensive heart disease with heart failure: Secondary | ICD-10-CM | POA: Diagnosis present

## 2017-01-23 DIAGNOSIS — I69354 Hemiplegia and hemiparesis following cerebral infarction affecting left non-dominant side: Secondary | ICD-10-CM | POA: Diagnosis not present

## 2017-01-23 LAB — BASIC METABOLIC PANEL
ANION GAP: 9 (ref 5–15)
BUN: 12 mg/dL (ref 6–20)
CO2: 25 mmol/L (ref 22–32)
Calcium: 9.7 mg/dL (ref 8.9–10.3)
Chloride: 105 mmol/L (ref 101–111)
Creatinine, Ser: 0.92 mg/dL (ref 0.61–1.24)
GFR calc Af Amer: >60 mL/min (ref >60)
GLUCOSE: 108 mg/dL — AB (ref 65–99)
POTASSIUM: 3.3 mmol/L — AB (ref 3.5–5.1)
SODIUM: 139 mmol/L (ref 135–145)

## 2017-01-23 LAB — GLUCOSE, CAPILLARY
Glucose-Capillary: 121 mg/dL — ABNORMAL HIGH (ref 65–99)
Glucose-Capillary: 118 mg/dL — ABNORMAL HIGH (ref 65–99)

## 2017-01-23 LAB — ECHOCARDIOGRAM COMPLETE
HEIGHTINCHES: 69 in
Weight: 2528 oz

## 2017-01-23 LAB — CBC
HEMATOCRIT: 37.3 % — AB (ref 39.0–52.0)
HEMOGLOBIN: 12.3 g/dL — AB (ref 13.0–17.0)
MCH: 23.5 pg — ABNORMAL LOW (ref 26.0–34.0)
MCHC: 33 g/dL (ref 30.0–36.0)
MCV: 71.2 fL — ABNORMAL LOW (ref 78.0–100.0)
Platelets: 374 10*3/uL (ref 150–400)
RBC: 5.24 MIL/uL (ref 4.22–5.81)
RDW: 15.6 % — ABNORMAL HIGH (ref 11.5–15.5)
WBC: 15.7 10*3/uL — AB (ref 4.0–10.5)

## 2017-01-23 LAB — RAPID URINE DRUG SCREEN, HOSP PERFORMED
Amphetamines: NOT DETECTED
BENZODIAZEPINES: NOT DETECTED
Barbiturates: NOT DETECTED
COCAINE: POSITIVE — AB
Opiates: POSITIVE — AB
Tetrahydrocannabinol: NOT DETECTED

## 2017-01-23 LAB — CBG MONITORING, ED
Glucose-Capillary: 172 mg/dL — ABNORMAL HIGH (ref 65–99)
Glucose-Capillary: 108 mg/dL — ABNORMAL HIGH (ref 65–99)

## 2017-01-23 MED ORDER — ENOXAPARIN SODIUM 40 MG/0.4ML ~~LOC~~ SOLN
40.0000 mg | SUBCUTANEOUS | Status: DC
  Administered 2017-01-23: 40 mg via SUBCUTANEOUS
  Filled 2017-01-23 (×2): qty 0.4

## 2017-01-23 MED ORDER — OXYCODONE-ACETAMINOPHEN 5-325 MG PO TABS
1.0000 | ORAL_TABLET | Freq: Three times a day (TID) | ORAL | Status: DC | PRN
  Administered 2017-01-23: 1 via ORAL
  Filled 2017-01-23: qty 1

## 2017-01-23 MED ORDER — PRAVASTATIN SODIUM 20 MG PO TABS
20.0000 mg | ORAL_TABLET | Freq: Every day | ORAL | Status: DC
  Administered 2017-01-23: 20 mg via ORAL
  Filled 2017-01-23: qty 1

## 2017-01-23 MED ORDER — PANTOPRAZOLE SODIUM 20 MG PO TBEC
20.0000 mg | DELAYED_RELEASE_TABLET | Freq: Every day | ORAL | Status: DC
  Administered 2017-01-23 – 2017-01-24 (×2): 20 mg via ORAL
  Filled 2017-01-23 (×2): qty 1

## 2017-01-23 MED ORDER — INSULIN ASPART 100 UNIT/ML ~~LOC~~ SOLN
0.0000 [IU] | Freq: Three times a day (TID) | SUBCUTANEOUS | Status: DC
  Administered 2017-01-23: 2 [IU] via SUBCUTANEOUS
  Administered 2017-01-23: 1 [IU] via SUBCUTANEOUS
  Filled 2017-01-23: qty 1

## 2017-01-23 MED ORDER — ALBUTEROL SULFATE (2.5 MG/3ML) 0.083% IN NEBU: 2.5000 mg | INHALATION_SOLUTION | Freq: Four times a day (QID) | RESPIRATORY_TRACT | Status: DC | PRN

## 2017-01-23 MED ORDER — ONDANSETRON HCL 4 MG/2ML IJ SOLN
4.0000 mg | Freq: Four times a day (QID) | INTRAMUSCULAR | Status: DC | PRN
  Administered 2017-01-23: 4 mg via INTRAVENOUS
  Filled 2017-01-23: qty 2

## 2017-01-23 MED ORDER — HYDROCORTISONE ACETATE 25 MG RE SUPP
25.0000 mg | Freq: Two times a day (BID) | RECTAL | Status: DC
  Filled 2017-01-23 (×3): qty 1

## 2017-01-23 MED ORDER — GABAPENTIN 100 MG PO CAPS
100.0000 mg | ORAL_CAPSULE | Freq: Three times a day (TID) | ORAL | Status: DC
  Administered 2017-01-23 – 2017-01-24 (×4): 100 mg via ORAL
  Filled 2017-01-23 (×4): qty 1

## 2017-01-23 MED ORDER — ONDANSETRON HCL 4 MG PO TABS
4.0000 mg | ORAL_TABLET | Freq: Four times a day (QID) | ORAL | Status: DC | PRN
  Administered 2017-01-23: 4 mg via ORAL
  Filled 2017-01-23: qty 1

## 2017-01-23 MED ORDER — TAMSULOSIN HCL 0.4 MG PO CAPS
0.4000 mg | ORAL_CAPSULE | Freq: Every day | ORAL | Status: DC
  Administered 2017-01-23: 0.4 mg via ORAL
  Filled 2017-01-23: qty 1

## 2017-01-23 MED ORDER — AMLODIPINE BESYLATE 10 MG PO TABS
10.0000 mg | ORAL_TABLET | Freq: Every day | ORAL | Status: DC
  Administered 2017-01-23 – 2017-01-24 (×2): 10 mg via ORAL
  Filled 2017-01-23: qty 2
  Filled 2017-01-23: qty 1

## 2017-01-23 MED ORDER — ISOSORBIDE DINITRATE 10 MG PO TABS
10.0000 mg | ORAL_TABLET | Freq: Three times a day (TID) | ORAL | Status: DC
  Administered 2017-01-23 (×3): 10 mg via ORAL
  Filled 2017-01-23 (×4): qty 1

## 2017-01-23 MED ORDER — ACETAMINOPHEN 650 MG RE SUPP: 650.0000 mg | Freq: Four times a day (QID) | RECTAL | Status: DC | PRN

## 2017-01-23 MED ORDER — CEFTRIAXONE SODIUM 1 G IJ SOLR
1.0000 g | INTRAMUSCULAR | Status: DC
  Administered 2017-01-23 – 2017-01-24 (×2): 1 g via INTRAVENOUS
  Filled 2017-01-23 (×2): qty 10

## 2017-01-23 MED ORDER — ASPIRIN EC 81 MG PO TBEC
81.0000 mg | DELAYED_RELEASE_TABLET | Freq: Every day | ORAL | Status: DC
  Administered 2017-01-23 – 2017-01-24 (×2): 81 mg via ORAL
  Filled 2017-01-23 (×2): qty 1

## 2017-01-23 MED ORDER — ACETAMINOPHEN 325 MG PO TABS: 650.0000 mg | ORAL_TABLET | Freq: Four times a day (QID) | ORAL | Status: DC | PRN

## 2017-01-23 MED ORDER — CYCLOBENZAPRINE HCL 10 MG PO TABS: 10.0000 mg | ORAL_TABLET | Freq: Three times a day (TID) | ORAL | Status: DC | PRN

## 2017-01-23 MED ORDER — HYDRALAZINE HCL 50 MG PO TABS
100.0000 mg | ORAL_TABLET | Freq: Three times a day (TID) | ORAL | Status: DC
  Administered 2017-01-23 – 2017-01-24 (×4): 100 mg via ORAL
  Filled 2017-01-23 (×5): qty 2

## 2017-01-23 NOTE — Progress Notes (Signed)
Pt seen and examined, admitted this am by Dr.Kakrkandy 65/M with DM, HTN, CVA admitted after a syncopal event.  Syncope -  - QTC is borderline prolonged, Orthostatics negative -Takes FLomax and Imdur and this along with recent diarrhea and poor Po intake could be contributing, despite negative Orthostatics -IVF today, monitor on tele, FU ECHO -Hold Flomax  UTI -  -continue ceftriaxone. FU Urine cultures -prior h/o same in Nov, tells me that he follows with Urology  Diabetes mellitus type 2 -  -hold metformin, SSI  Hypertension  -continue amlodipine/hydralazine and Imdur. -Orthostatics negative  History of stroke  -on statins  History of pituitary adenoma.  -Outpatient FU  Domenic Polite, MD

## 2017-01-23 NOTE — H&P (Addendum)
History and Physical    [name] [id number] DOB: 1951-02-29 DOA: 01/22/2017  PCP: Philis Fendt, MD  Patient coming from: Home.  Chief Complaint: Loss of consciousness.  HPI: [name] is a 66 y.o. male with history of stroke with left-sided weakness, diabetes mellitus, hypertension and diastolic dysfunction presents to the ER after patient had a brief episode of loss of consciousness. Patient states he was at home cooking when he suddenly turned back and lost consciousness. He states he lost consciousness briefly for about 10 seconds. Denies any incontinence of urine or tongue bite. Denies any palpitations chest pain or shortness of breath prior to the episode. CT head was unremarkable. EKG shows borderline QTC at 499 ms. Patient is being admitted for further observation. Denies drinking alcohol or using drugs at this time.   ED Course: CT head was unremarkable. EKG shows borderline QTC. Patient complained of right wrist pain for which x-rays were negative. UA was concerning for UTI. Patient is mildly febrile.  Review of Systems: As per HPI, rest all negative.   Past Medical History:  Diagnosis Date  . Alcohol dependence ([place]) 12/10/2013  . Angioedema of lips 12/24/2013  . Brain tumor ([place])   . CHF (congestive heart failure) ([facility])    diastolic  . Chronic back pain   . Chronic headache   . CVA (cerebral vascular accident) ([place])   . Diabetes mellitus   . HTN (hypertension), malignant 09/06/2012  . Hypertension   . Narcotic abuse   . Normal cardiac stress test 2009  . TIA (transient ischemic attack) 09/06/2012  . Tobacco use disorder 12/10/2013  . Vertigo 09/06/2012    Past Surgical History:  Procedure Laterality Date  . NO PAST SURGERIES       reports that he has been smoking Cigarettes.  He has been smoking about 0.50 packs per day. He uses smokeless tobacco. He reports that he drinks alcohol. He reports that he uses drugs, including Marijuana, Cocaine, and  IV.  No Known Allergies  Family History  Problem Relation Age of Onset  . CAD Father     "heart attack in his 53's"  . Hypertension Father   . Diabetes Brother   . Diabetes Sister   . Hypertension Mother     Prior to Admission medications   Medication Sig Start Date End Date Taking? Authorizing Provider  albuterol (PROVENTIL HFA;VENTOLIN HFA) 108 (90 Base) MCG/ACT inhaler Inhale 1-2 puffs into the lungs every 6 (six) hours as needed for wheezing. 09/04/16  Yes Tanna Furry, MD  amLODipine (NORVASC) 10 MG tablet Take 10 mg by mouth daily.   Yes Historical Provider, MD  aspirin EC 81 MG tablet Take 81 mg by mouth daily.   Yes Historical Provider, MD  b complex vitamins tablet Take 1 tablet by mouth daily.   Yes Historical Provider, MD  cyclobenzaprine (FLEXERIL) 10 MG tablet Take 1 tablet (10 mg total) by mouth 3 (three) times daily as needed for muscle spasms. 05/21/16  Yes Christopher Lawyer, PA-C  gabapentin (NEURONTIN) 100 MG capsule Take 100 mg by mouth 3 (three) times daily. 06/10/16  Yes Historical Provider, MD  hydrALAZINE (APRESOLINE) 100 MG tablet Take 1 tablet (100 mg total) by mouth every 8 (eight) hours. 01/30/15  Yes Reyne Dumas, MD  HYDROcodone-acetaminophen (NORCO/VICODIN) 5-325 MG tablet Take 1 tablet by mouth every 6 (six) hours as needed for moderate pain. 05/21/16  Yes Christopher Lawyer, PA-C  hydrocortisone (ANUSOL-HC) 25 MG suppository Place 1 suppository (  25 mg total) rectally 2 (two) times daily. 09/04/16  Yes Tanna Furry, MD  ibuprofen (ADVIL,MOTRIN) 800 MG tablet Take 800 mg by mouth every 8 (eight) hours as needed. pain 06/10/16  Yes Historical Provider, MD  isosorbide dinitrate (ISORDIL) 10 MG tablet Take 1 tablet (10 mg total) by mouth 3 (three) times daily. 12/27/13  Yes Debbe Odea, MD  metFORMIN (GLUCOPHAGE) 500 MG tablet Take 500 mg by mouth 2 (two) times daily with a meal.   Yes Historical Provider, MD  naproxen (NAPROSYN) 375 MG tablet Take 1 tablet (375 mg total)  by mouth 2 (two) times daily. 01/03/16  Yes Ankit Nanavati, MD  ondansetron (ZOFRAN) 4 MG tablet Take 1 tablet (4 mg total) by mouth every 6 (six) hours. Patient taking differently: Take 4 mg by mouth every 8 (eight) hours as needed for nausea.  03/20/16  Yes Montine Circle, PA-C  oxyCODONE-acetaminophen (PERCOCET/ROXICET) 5-325 MG tablet Take 1 tablet by mouth every 8 (eight) hours as needed for moderate pain.  06/10/16  Yes Historical Provider, MD  pantoprazole (PROTONIX) 20 MG tablet Take 1 tablet (20 mg total) by mouth daily. 07/02/15  Yes [name], MD  pravastatin (PRAVACHOL) 20 MG tablet Take 20 mg by mouth at bedtime.   Yes Historical Provider, MD  predniSONE (DELTASONE) 20 MG tablet Take 1 tablet (20 mg total) by mouth 2 (two) times daily with a meal. 09/04/16  Yes Tanna Furry, MD  tamsulosin (FLOMAX) 0.4 MG CAPS capsule Take 1 capsule (0.4 mg total) by mouth daily. 07/18/16  Yes Stevi Barrett, PA-C    Physical Exam: Vitals:   01/22/17 2345 01/23/17 0000 01/23/17 0045 01/23/17 0130  BP: 149/72 138/63 (!) 139/49 145/58  Pulse: 94 76 69 73  Resp: 15 13 14 16   Temp:      TempSrc:      SpO2: 96% 98% 95% 97%  Weight:      Height:          Constitutional: Moderately built and nourished woman Vitals:   01/22/17 2345 01/23/17 0000 01/23/17 0045 01/23/17 0130  BP: 149/72 138/63 (!) 139/49 145/58  Pulse: 94 76 69 73  Resp: 15 13 14 16   Temp:      TempSrc:      SpO2: 96% 98% 95% 97%  Weight:      Height:       Eyes: Anicteric no pallor. ENMT: No discharge from the ears eyes nose and mouth. Neck: No mass felt. No neck rigidity no JVD appreciated. Respiratory: No rhonchi or crepitations. Cardiovascular: S1 and S2 heard. No murmurs appreciated. Abdomen: Soft nontender bowel sounds present. No guarding or rigidity. Musculoskeletal: No edema no joint effusion. Skin: No rash. Skin appears warm. Neurologic: Alert awake oriented to time place and person. Has left-sided weakness from  previous stroke. Psychiatric: Appears normal. Normal affect.   Labs on Admission: I have personally reviewed following labs and imaging studies  CBC:  Recent Labs Lab 01/22/17 1927  WBC 14.6*  HGB 12.7*  HCT 38.2*  MCV 70.6*  PLT [id number]   Basic Metabolic Panel:  Recent Labs Lab 01/22/17 1927  NA 138  K 3.5  CL 103  CO2 23  GLUCOSE 138*  BUN 13  CREATININE 1.03  CALCIUM 9.8   GFR: Estimated Creatinine Clearance: 69.9 mL/min (by C-G formula based on SCr of 1.03 mg/dL). Liver Function Tests: No results for input(s): AST, ALT, ALKPHOS, BILITOT, PROT, ALBUMIN in the last 168 hours. No results for input(s): LIPASE, AMYLASE  in the last 168 hours. No results for input(s): AMMONIA in the last 168 hours. Coagulation Profile: No results for input(s): INR, PROTIME in the last 168 hours. Cardiac Enzymes: No results for input(s): CKTOTAL, CKMB, CKMBINDEX, TROPONINI in the last 168 hours. BNP (last 3 results) No results for input(s): PROBNP in the last 8760 hours. HbA1C: No results for input(s): HGBA1C in the last 72 hours. CBG: No results for input(s): GLUCAP in the last 168 hours. Lipid Profile: No results for input(s): CHOL, HDL, LDLCALC, TRIG, CHOLHDL, LDLDIRECT in the last 72 hours. Thyroid Function Tests: No results for input(s): TSH, T4TOTAL, FREET4, T3FREE, THYROIDAB in the last 72 hours. Anemia Panel: No results for input(s): VITAMINB12, FOLATE, FERRITIN, TIBC, IRON, RETICCTPCT in the last 72 hours. Urine analysis:    Component Value Date/Time   COLORURINE AMBER (A) 01/22/2017 1921   APPEARANCEUR CLOUDY (A) 01/22/2017 1921   LABSPEC 1.013 01/22/2017 1921   PHURINE 6.0 01/22/2017 1921   GLUCOSEU NEGATIVE 01/22/2017 1921   HGBUR NEGATIVE 01/22/2017 1921   BILIRUBINUR NEGATIVE 01/22/2017 1921   KETONESUR NEGATIVE 01/22/2017 1921   PROTEINUR NEGATIVE 01/22/2017 1921   UROBILINOGEN 2.0 (H) 08/06/2015 1209   NITRITE NEGATIVE 01/22/2017 1921   LEUKOCYTESUR LARGE (A)  01/22/2017 1921   Sepsis Labs: @LABRCNTIP (procalcitonin:4,lacticidven:4) )No results found for this or any previous visit (from the past 240 hour(s)).   Radiological Exams on Admission: Dg Wrist Complete Right  Result Date: 01/22/2017 CLINICAL DATA:  Patient fell and injured right wrist. EXAM: RIGHT WRIST - COMPLETE 3+ VIEW COMPARISON:  None. FINDINGS: There is no evidence of fracture or dislocation. There is no evidence of arthropathy or other focal bone abnormality. Soft tissues are unremarkable. IMPRESSION: Negative. Electronically Signed   By: Misty Stanley M.D.   On: 01/22/2017 22:51   Ct Head Wo Contrast  Result Date: 01/22/2017 CLINICAL DATA:  Loss of consciousness, striking the right side of the head on the floor. Headache. Brain tumor. EXAM: CT HEAD WITHOUT CONTRAST TECHNIQUE: Contiguous axial images were obtained from the base of the skull through the vertex without intravenous contrast. COMPARISON:  03/09/2016.  11/03/2014. FINDINGS: Brain: Normal appearance without evidence of atrophy, old or acute infarction, intra-axial mass lesion, hemorrhage, hydrocephalus or extra-axial collection. Previously seen pituitary adenoma cannot be resolved on this study. No suprasellar mass. Vascular: Normal Skull: Normal Sinuses/Orbits: Clear/normal Other: None significant IMPRESSION: No acute or traumatic finding. Normal examination. No visible pituitary mass on this exam. Electronically Signed   By: Nelson Chimes M.D.   On: 01/22/2017 19:47    EKG: Independently reviewed. Normal sinus rhythm with QTC of 499 ms.  Assessment/Plan Principal Problem:   Syncope Active Problems:   Diabetes mellitus type 2, controlled ([place])   Essential hypertension   Chronic diastolic heart failure ([place])    1. Syncope - cause not clear. QTC is borderline prolonged. Closely monitor telemetry for arrhythmias. Check orthostatics were checked 2-D echo. Check UDS. 2. UTI - patient is placed on ceftriaxone. Check urine  cultures. 3. Diabetes mellitus type 2 - hold metformin while inpatient. Patient will be on sliding scale coverage. 4. Hypertension - will continue home medications including amlodipine and hydralazine and Imdur. 5. History of stroke on statins. 6. History of pituitary adenoma. Denies any headache or visual symptoms. May consider MRI brain if any of these symptoms presents.  Patient states he denies using any alcohol at this time. Urine drug screen and chest x-ray and XRAY Pelvis are pending.   DVT prophylaxis: Lovenox. Code  Status: Full code.  Family Communication: Discussed with patient.  Disposition Plan: Home.  Consults called: None.  Admission status: Observation.    Rise Patience MD [facility] Pager [phone].  If 7PM-7AM, please contact night-coverage [email or web address] Password [email or web address]  01/23/2017, 2:24 AM

## 2017-01-23 NOTE — Progress Notes (Signed)
Upon arrival to unit, [place], NT, greeted pt, got set of vitals, placed pt on telemetry.  However, when this nurse went to room to assess pt, he was difficult to arouse.  Asked Jakeema, AD, for assistance in waking him.  With delay, pt was finally found to be alert and oriented x4.  Skin assessment completed, admission questions answered appropriately and completed.

## 2017-01-23 NOTE — ED Notes (Signed)
Pt is aware urine is needed for testing, pt is unable to provide a sample at this time. Urinal at bedside.

## 2017-01-23 NOTE — Progress Notes (Signed)
  Echocardiogram 2D Echocardiogram has been performed.  [name] M 01/23/2017, 3:55 PM

## 2017-01-23 NOTE — Progress Notes (Signed)
Pharmacy Antibiotic Note  [name] is a 66 y.o. male admitted on 01/22/2017 with UTI.  Pharmacy has been consulted for Rocephin dosing.  Plan: Rocephin 1g IV Q24H.  Pharmacy will sign off.  Height: 5\' 9"  (175.3 cm) Weight: 150 lb 7 oz (68.2 kg) IBW/kg (Calculated) : 70.7  Temp (24hrs), Avg:99.3 F (37.4 C), Min:99.3 F (37.4 C), Max:99.3 F (37.4 C)   Recent Labs Lab 01/22/17 1927  WBC 14.6*  CREATININE 1.03    Estimated Creatinine Clearance: 69.9 mL/min (by C-G formula based on SCr of 1.03 mg/dL).    No Known Allergies   Thank you for allowing pharmacy to be a part of this patient's care.  Wynona Neat, PharmD, BCPS  01/23/2017 1:56 AM

## 2017-01-24 LAB — GLUCOSE, CAPILLARY: GLUCOSE-CAPILLARY: 115 mg/dL — AB (ref 65–99)

## 2017-01-24 MED ORDER — POTASSIUM CHLORIDE CRYS ER 20 MEQ PO TBCR
40.0000 meq | EXTENDED_RELEASE_TABLET | Freq: Once | ORAL | Status: AC
  Administered 2017-01-24: 40 meq via ORAL
  Filled 2017-01-24: qty 2

## 2017-01-24 MED ORDER — HYDRALAZINE HCL 50 MG PO TABS: 50.0000 mg | ORAL_TABLET | Freq: Three times a day (TID) | ORAL | Status: DC

## 2017-01-24 NOTE — Evaluation (Signed)
Physical Therapy Evaluation Patient Details Name: [name] MRN: [id number] DOB: 20-Jan-1951 Today's Date: 01/24/2017   History of Present Illness  66 y.o. male with history of stroke with left-sided weakness, diabetes mellitus, hypertension and diastolic dysfunction presents to the ER after patient had a brief episode of loss of consciousness.  Clinical Impression  Patient seen for mobility assessment. Patient mobilizing well, ambulated on room air with HR and O2 saturations stable. 101 HR 93% O2 saturations. Patient independent with activity. No further acute PT needs, will sign off.    Follow Up Recommendations No PT follow up    Equipment Recommendations  None recommended by PT    Recommendations for Other Services       Precautions / Restrictions Restrictions Weight Bearing Restrictions: No      Mobility  Bed Mobility Overal bed mobility: Independent                Transfers Overall transfer level: Independent                  Ambulation/Gait Ambulation/Gait assistance: Independent Ambulation Distance (Feet): 320 Feet Assistive device: None Gait Pattern/deviations: WFL(Within Functional Limits)   Gait velocity interpretation: at or above normal speed for age/gender General Gait Details: steady with ambulation  Stairs            Wheelchair Mobility    Modified Rankin (Stroke Patients Only)       Balance Overall balance assessment: Independent                           High level balance activites: Side stepping;Backward walking;Direction changes;Turns;Sudden stops;Head turns High Level Balance Comments: independent with higher level activity             Pertinent Vitals/Pain Pain Assessment: Faces Faces Pain Scale: No hurt    Home Living Family/patient expects to be discharged to:: Private residence Living Arrangements: Alone Available Help at Discharge: Family;Neighbor Type of Home: House Home Access: Level  entry     Home Layout: One level Home Equipment: Environmental consultant - 2 wheels      Prior Function Level of Independence: Independent         Comments: ocassional use of AD     Hand Dominance   Dominant Hand: Right    Extremity/Trunk Assessment   Upper Extremity Assessment Upper Extremity Assessment: Overall WFL for tasks assessed    Lower Extremity Assessment Lower Extremity Assessment: Overall WFL for tasks assessed       Communication   Communication: No difficulties  Cognition Arousal/Alertness: Awake/alert Behavior During Therapy: WFL for tasks assessed/performed Overall Cognitive Status: Within Functional Limits for tasks assessed                      General Comments      Exercises     Assessment/Plan    PT Assessment Patent does not need any further PT services  PT Problem List            PT Treatment Interventions      PT Goals (Current goals can be found in the Care Plan section)  Acute Rehab PT Goals PT Goal Formulation: All assessment and education complete, DC therapy    Frequency     Barriers to discharge        Co-evaluation               End of Session Equipment Utilized During Treatment: Gait  belt Activity Tolerance: Patient tolerated treatment well Patient left: in bed;with call bell/phone within reach (sitting EOB) Nurse Communication: Mobility status         Time: [id number] PT Time Calculation (min) (ACUTE ONLY): 12 min   Charges:   PT Evaluation $PT Eval Low Complexity: 1 Procedure     PT G Codes:        Duncan Dull 02-11-17, 10:46 AM Alben Deeds, PT DPT  [phone]

## 2017-01-24 NOTE — Progress Notes (Signed)
Pt requesting to leave AMA. MD made aware asked me to let cardiology know. Dr. Wynonia Lawman on the floor made aware.

## 2017-01-24 NOTE — Progress Notes (Signed)
Patient alert and oriented, pt. Request to leave AMA, encourage the patient to wait for cardiology, pt. Refused, pt. Michela Pitcher he has something to do at home. IV and tele d/c. Pt. Left AMA. MD notified see the primary nurse note.

## 2017-01-24 NOTE — Progress Notes (Signed)
Pt states he is giving the doctors till 30. Md made aware.

## 2017-01-24 NOTE — Progress Notes (Signed)
PROGRESS NOTE    [name]  [id number] DOB: 1951-11-11 DOA: 01/22/2017 PCP: Philis Fendt, MD  Brief Narrative: 65/M with DM, HTN, CVA admitted after a syncopal event. No prodrome  Assessment & Plan:   Syncope- without prodrome - QTC is borderline prolonged, Orthostatics negative - ECHO with HOCM with dynamic outflow  Obstruction, not noted on last ECHO in Novemenber - will stop Imdur and cut down Hydralazine to 50mg  TID from 100mg  TID -Holding Flomax  UTI-  -continue ceftriaxone. FU Urine cultures -prior h/o same in Nov, tells me that he follows with Urology  Diabetes mellitus type 2-  -hold metformin, SSI  Hypertension -continue amlodipine, cut down hydralazine and stop Imdur. -Orthostatics negative -start Lisinopril later today or at discharge depending on BP trend  History of stroke -on statins  History of pituitary adenoma.  -Outpatient FU  Full Code Family communication:  Dispo: Home pending cards eval  Consultants:   Cards-    Subjective: Feels well, wants to go home  Objective: Vitals:   01/23/17 1346 01/23/17 2019 01/24/17 0005 01/24/17 0609  BP: (!) 177/64 (!) 165/62 (!) 131/51 (!) 125/53  Pulse: 90 82 98 75  Resp: 18 18 20 18   Temp: 99.5 F (37.5 C)  99.7 F (37.6 C) 98.7 F (37.1 C)  TempSrc:   Oral Oral  SpO2: 94% 95% 100% 99%  Weight: 71.7 kg (158 lb)   65.5 kg (144 lb 4.8 oz)  Height: 5\' 9"  (1.753 m)       Intake/Output Summary (Last 24 hours) at 01/24/17 0849 Last data filed at 01/24/17 0653  Gross per 24 hour  Intake              580 ml  Output                0 ml  Net              580 ml   Filed Weights   01/22/17 1917 01/23/17 1346 01/24/17 0609  Weight: 68.2 kg (150 lb 7 oz) 71.7 kg (158 lb) 65.5 kg (144 lb 4.8 oz)    Examination:  General exam: Appears calm and comfortable, thinly built Respiratory system: Clear to auscultation. Respiratory effort normal. Cardiovascular system: S1 & S2 heard, RRR.  No JVD systolic murmurs, rubs, gallops or clicks. No pedal edema. Gastrointestinal system: Abdomen is nondistended, soft and nontender.  Normal bowel sounds heard. Central nervous system: Alert and oriented. No focal neurological deficits. Extremities: Symmetric 5 x 5 power. Skin: No rashes, lesions or ulcers Psychiatry: Judgement and insight appear normal. Mood & affect appropriate.     Data Reviewed: I have personally reviewed following labs and imaging studies  CBC:  Recent Labs Lab 01/22/17 1927 01/23/17 0509  WBC 14.6* 15.7*  HGB 12.7* 12.3*  HCT 38.2* 37.3*  MCV 70.6* 71.2*  PLT 361 [id number]   Basic Metabolic Panel:  Recent Labs Lab 01/22/17 1927 01/23/17 0509  NA 138 139  K 3.5 3.3*  CL 103 105  CO2 23 25  GLUCOSE 138* 108*  BUN 13 12  CREATININE 1.03 0.92  CALCIUM 9.8 9.7   GFR: Estimated Creatinine Clearance: 75.2 mL/min (by C-G formula based on SCr of 0.92 mg/dL). Liver Function Tests: No results for input(s): AST, ALT, ALKPHOS, BILITOT, PROT, ALBUMIN in the last 168 hours. No results for input(s): LIPASE, AMYLASE in the last 168 hours. No results for input(s): AMMONIA in the last 168 hours. Coagulation Profile: No results for  input(s): INR, PROTIME in the last 168 hours. Cardiac Enzymes: No results for input(s): CKTOTAL, CKMB, CKMBINDEX, TROPONINI in the last 168 hours. BNP (last 3 results) No results for input(s): PROBNP in the last 8760 hours. HbA1C: No results for input(s): HGBA1C in the last 72 hours. CBG:  Recent Labs Lab 01/23/17 0918 01/23/17 1221 01/23/17 1621 01/23/17 2110 01/24/17 0600  GLUCAP 172* 108* 121* 118* 115*   Lipid Profile: No results for input(s): CHOL, HDL, LDLCALC, TRIG, CHOLHDL, LDLDIRECT in the last 72 hours. Thyroid Function Tests: No results for input(s): TSH, T4TOTAL, FREET4, T3FREE, THYROIDAB in the last 72 hours. Anemia Panel: No results for input(s): VITAMINB12, FOLATE, FERRITIN, TIBC, IRON, RETICCTPCT in the  last 72 hours. Urine analysis:    Component Value Date/Time   COLORURINE AMBER (A) 01/22/2017 1921   APPEARANCEUR CLOUDY (A) 01/22/2017 1921   LABSPEC 1.013 01/22/2017 1921   PHURINE 6.0 01/22/2017 1921   GLUCOSEU NEGATIVE 01/22/2017 1921   HGBUR NEGATIVE 01/22/2017 1921   BILIRUBINUR NEGATIVE 01/22/2017 1921   KETONESUR NEGATIVE 01/22/2017 1921   PROTEINUR NEGATIVE 01/22/2017 1921   UROBILINOGEN 2.0 (H) 08/06/2015 1209   NITRITE NEGATIVE 01/22/2017 1921   LEUKOCYTESUR LARGE (A) 01/22/2017 1921   Sepsis Labs: @LABRCNTIP (procalcitonin:4,lacticidven:4)  )No results found for this or any previous visit (from the past 240 hour(s)).       Radiology Studies: Dg Wrist Complete Right  Result Date: 01/22/2017 CLINICAL DATA:  Patient fell and injured right wrist. EXAM: RIGHT WRIST - COMPLETE 3+ VIEW COMPARISON:  None. FINDINGS: There is no evidence of fracture or dislocation. There is no evidence of arthropathy or other focal bone abnormality. Soft tissues are unremarkable. IMPRESSION: Negative. Electronically Signed   By: Misty Stanley M.D.   On: 01/22/2017 22:51   Ct Head Wo Contrast  Result Date: 01/22/2017 CLINICAL DATA:  Loss of consciousness, striking the right side of the head on the floor. Headache. Brain tumor. EXAM: CT HEAD WITHOUT CONTRAST TECHNIQUE: Contiguous axial images were obtained from the base of the skull through the vertex without intravenous contrast. COMPARISON:  03/09/2016.  11/03/2014. FINDINGS: Brain: Normal appearance without evidence of atrophy, old or acute infarction, intra-axial mass lesion, hemorrhage, hydrocephalus or extra-axial collection. Previously seen pituitary adenoma cannot be resolved on this study. No suprasellar mass. Vascular: Normal Skull: Normal Sinuses/Orbits: Clear/normal Other: None significant IMPRESSION: No acute or traumatic finding. Normal examination. No visible pituitary mass on this exam. Electronically Signed   By: Nelson Chimes M.D.    On: 01/22/2017 19:47   Dg Pelvis Portable  Result Date: 01/23/2017 CLINICAL DATA:  [place] last night landing on RIGHT side, pain at posterior RIGHT hip, history diabetes mellitus, hypertension, smoking EXAM: PORTABLE PELVIS 1-2 VIEWS COMPARISON:  Pelvic CT 07/05/2016 FINDINGS: Osseous demineralization. Rotated to the LEFT. Hip joint spaces symmetric. RIGHT SI joint suboptimally profiled. LEFT SI joint intact. No acute fracture, dislocation, or bone destruction. Scattered atherosclerotic calcifications. IMPRESSION: Osseous demineralization without acute abnormalities. Electronically Signed   By: Lavonia Dana M.D.   On: 01/23/2017 09:04   Dg Chest Port 1 View  Result Date: 01/23/2017 CLINICAL DATA:  Fever, shortness of breath, syncope EXAM: PORTABLE CHEST 1 VIEW COMPARISON:  11/01/2016 FINDINGS: Heart and mediastinal contours are within normal limits. No focal opacities or effusions. No acute bony abnormality. IMPRESSION: No active disease. Electronically Signed   By: Rolm Baptise M.D.   On: 01/23/2017 08:24        Scheduled Meds: . amLODipine  10 mg Oral Daily  .  aspirin EC  81 mg Oral Daily  . cefTRIAXone (ROCEPHIN)  IV  1 g Intravenous Q24H  . enoxaparin (LOVENOX) injection  40 mg Subcutaneous Q24H  . gabapentin  100 mg Oral TID  . hydrALAZINE  100 mg Oral Q8H  . hydrocortisone  25 mg Rectal BID  . insulin aspart  0-9 Units Subcutaneous TID WC  . isosorbide dinitrate  10 mg Oral TID  . pantoprazole  20 mg Oral Daily  . pravastatin  20 mg Oral QHS   Continuous Infusions:   LOS: 1 day    Time spent: 41min    Domenic Polite, MD [facility] Pager [phone]  If 7PM-7AM, please contact night-coverage [email or web address] Password [facility] 01/24/2017, 8:49 AM

## 2017-01-27 NOTE — Discharge Summary (Signed)
Physician Discharge Summary  [name] [id number] DOB: 1951/06/04 DOA: 01/22/2017  PCP: Philis Fendt, MD  Admit date: 01/22/2017 Discharge date: 01/24/2017  Time spent: 35 minutes  Recommendations for Outpatient Follow-up:  LEFT AMA  Discharge Diagnoses:  Principal Problem:   Syncope Active Problems:   Diabetes mellitus type 2, controlled ([place])   Essential hypertension   Chronic diastolic heart failure [facility])   Filed Weights   01/22/17 1917 01/23/17 1346 01/24/17 0609  Weight: 68.2 kg (150 lb 7 oz) 71.7 kg (158 lb) 65.5 kg (144 lb 4.8 oz)    History of present illness:  65/M with DM, HTN, CVA admitted after a syncopal event.   Hospital Course:   Syncope QTC is borderline prolonged, Orthostatics negative - ECHO with HOCM with dynamic outflow  Obstruction, not noted on last ECHO in Novemenber - will stop Imdur and cut down Hydralazine to 50mg  TID from 100mg  TID - Cardiology consulted, today, pt left AMA before Cardiology saw him today  UTI-  -on ceftriaxone -prior h/o same in Nov, tells me that he follows with Urology  Diabetes mellitus type 2-  -metformin held  Hypertension -continueamlodipine, cut down hydralazine and stop Imdur. -Orthostatics negative -see above, left AMA  History of stroke -on statins  History of pituitary adenoma.  -Outpatient FU   Procedures:  ECHO  Discharge Exam: Vitals:   01/24/17 0005 01/24/17 0609  BP: (!) 131/51 (!) 125/53  Pulse: 98 75  Resp: 20 18  Temp: 99.7 F (37.6 C) 98.7 F (37.1 C)     Discharge Instructions    Discharge Medication List as of 01/24/2017 11:21 AM    CONTINUE these medications which have NOT CHANGED   Details  albuterol (PROVENTIL HFA;VENTOLIN HFA) 108 (90 Base) MCG/ACT inhaler Inhale 1-2 puffs into the lungs every 6 (six) hours as needed for wheezing., Starting Thu 09/04/2016, Print    amLODipine (NORVASC) 10 MG tablet Take 10 mg by mouth daily., Until Discontinued,  Historical Med    aspirin EC 81 MG tablet Take 81 mg by mouth daily., Until Discontinued, Historical Med    b complex vitamins tablet Take 1 tablet by mouth daily., Until Discontinued, Historical Med    cyclobenzaprine (FLEXERIL) 10 MG tablet Take 1 tablet (10 mg total) by mouth 3 (three) times daily as needed for muscle spasms., Starting 05/21/2016, Until Discontinued, Print    gabapentin (NEURONTIN) 100 MG capsule Take 100 mg by mouth 3 (three) times daily., Starting 06/10/2016, Until Discontinued, Historical Med    hydrALAZINE (APRESOLINE) 100 MG tablet Take 1 tablet (100 mg total) by mouth every 8 (eight) hours., Starting 01/30/2015, Until Discontinued, Normal    HYDROcodone-acetaminophen (NORCO/VICODIN) 5-325 MG tablet Take 1 tablet by mouth every 6 (six) hours as needed for moderate pain., Starting 05/21/2016, Until Discontinued, Print    hydrocortisone (ANUSOL-HC) 25 MG suppository Place 1 suppository (25 mg total) rectally 2 (two) times daily., Starting Thu 09/04/2016, Print    ibuprofen (ADVIL,MOTRIN) 800 MG tablet Take 800 mg by mouth every 8 (eight) hours as needed. pain, Starting 06/10/2016, Until Discontinued, Historical Med    isosorbide dinitrate (ISORDIL) 10 MG tablet Take 1 tablet (10 mg total) by mouth 3 (three) times daily., Starting 12/27/2013, Until Discontinued, Print    metFORMIN (GLUCOPHAGE) 500 MG tablet Take 500 mg by mouth 2 (two) times daily with a meal., Until Discontinued, Historical Med    naproxen (NAPROSYN) 375 MG tablet Take 1 tablet (375 mg total) by mouth 2 (two) times daily.,  Starting 01/03/2016, Until Discontinued, Print    ondansetron (ZOFRAN) 4 MG tablet Take 1 tablet (4 mg total) by mouth every 6 (six) hours., Starting 03/20/2016, Until Discontinued, Print    oxyCODONE-acetaminophen (PERCOCET/ROXICET) 5-325 MG tablet Take 1 tablet by mouth every 8 (eight) hours as needed for moderate pain. , Starting 06/10/2016, Until Discontinued, Historical Med     pantoprazole (PROTONIX) 20 MG tablet Take 1 tablet (20 mg total) by mouth daily., Starting 07/02/2015, Until Discontinued, Print    pravastatin (PRAVACHOL) 20 MG tablet Take 20 mg by mouth at bedtime., Until Discontinued, Historical Med    predniSONE (DELTASONE) 20 MG tablet Take 1 tablet (20 mg total) by mouth 2 (two) times daily with a meal., Starting Thu 09/04/2016, Print    tamsulosin (FLOMAX) 0.4 MG CAPS capsule Take 1 capsule (0.4 mg total) by mouth daily., Starting Fri 07/18/2016, Print       No Known Allergies    The results of significant diagnostics from this hospitalization (including imaging, microbiology, ancillary and laboratory) are listed below for reference.    Significant Diagnostic Studies: Dg Wrist Complete Right  Result Date: 01/22/2017 CLINICAL DATA:  Patient fell and injured right wrist. EXAM: RIGHT WRIST - COMPLETE 3+ VIEW COMPARISON:  None. FINDINGS: There is no evidence of fracture or dislocation. There is no evidence of arthropathy or other focal bone abnormality. Soft tissues are unremarkable. IMPRESSION: Negative. Electronically Signed   By: Misty Stanley M.D.   On: 01/22/2017 22:51   Ct Head Wo Contrast  Result Date: 01/22/2017 CLINICAL DATA:  Loss of consciousness, striking the right side of the head on the floor. Headache. Brain tumor. EXAM: CT HEAD WITHOUT CONTRAST TECHNIQUE: Contiguous axial images were obtained from the base of the skull through the vertex without intravenous contrast. COMPARISON:  03/09/2016.  11/03/2014. FINDINGS: Brain: Normal appearance without evidence of atrophy, old or acute infarction, intra-axial mass lesion, hemorrhage, hydrocephalus or extra-axial collection. Previously seen pituitary adenoma cannot be resolved on this study. No suprasellar mass. Vascular: Normal Skull: Normal Sinuses/Orbits: Clear/normal Other: None significant IMPRESSION: No acute or traumatic finding. Normal examination. No visible pituitary mass on this exam.  Electronically Signed   By: Nelson Chimes M.D.   On: 01/22/2017 19:47   Dg Pelvis Portable  Result Date: 01/23/2017 CLINICAL DATA:  [place] last night landing on RIGHT side, pain at posterior RIGHT hip, history diabetes mellitus, hypertension, smoking EXAM: PORTABLE PELVIS 1-2 VIEWS COMPARISON:  Pelvic CT 07/05/2016 FINDINGS: Osseous demineralization. Rotated to the LEFT. Hip joint spaces symmetric. RIGHT SI joint suboptimally profiled. LEFT SI joint intact. No acute fracture, dislocation, or bone destruction. Scattered atherosclerotic calcifications. IMPRESSION: Osseous demineralization without acute abnormalities. Electronically Signed   By: Lavonia Dana M.D.   On: 01/23/2017 09:04   Dg Chest Port 1 View  Result Date: 01/23/2017 CLINICAL DATA:  Fever, shortness of breath, syncope EXAM: PORTABLE CHEST 1 VIEW COMPARISON:  11/01/2016 FINDINGS: Heart and mediastinal contours are within normal limits. No focal opacities or effusions. No acute bony abnormality. IMPRESSION: No active disease. Electronically Signed   By: Rolm Baptise M.D.   On: 01/23/2017 08:24    Microbiology: No results found for this or any previous visit (from the past 240 hour(s)).   Labs: Basic Metabolic Panel:  Recent Labs Lab 01/22/17 1927 01/23/17 0509  NA 138 139  K 3.5 3.3*  CL 103 105  CO2 23 25  GLUCOSE 138* 108*  BUN 13 12  CREATININE 1.03 0.92  CALCIUM 9.8 9.7  Liver Function Tests: No results for input(s): AST, ALT, ALKPHOS, BILITOT, PROT, ALBUMIN in the last 168 hours. No results for input(s): LIPASE, AMYLASE in the last 168 hours. No results for input(s): AMMONIA in the last 168 hours. CBC:  Recent Labs Lab 01/22/17 1927 01/23/17 0509  WBC 14.6* 15.7*  HGB 12.7* 12.3*  HCT 38.2* 37.3*  MCV 70.6* 71.2*  PLT 361 374   Cardiac Enzymes: No results for input(s): CKTOTAL, CKMB, CKMBINDEX, TROPONINI in the last 168 hours. BNP: BNP (last 3 results)  Recent Labs  11/01/16 0859  BNP 215.6*     ProBNP (last 3 results) No results for input(s): PROBNP in the last 8760 hours.  CBG:  Recent Labs Lab 01/23/17 0918 01/23/17 1221 01/23/17 1621 01/23/17 2110 01/24/17 0600  GLUCAP 172* 108* 121* 118* 115*       SignedDomenic Polite MD.  [facility] 01/27/2017, 3:35 PM

## 2017-08-27 ENCOUNTER — Encounter (HOSPITAL_COMMUNITY): Payer: Self-pay

## 2017-08-27 ENCOUNTER — Emergency Department (HOSPITAL_COMMUNITY)
Admission: EM | Admit: 2017-08-27 | Discharge: 2017-08-27 | Disposition: A | Discharge status: Home / Self Care | Payer: Medicare Other | Attending: Physician Assistant | Admitting: Physician Assistant

## 2017-08-27 DIAGNOSIS — I110 Hypertensive heart disease with heart failure: Secondary | ICD-10-CM | POA: Insufficient documentation

## 2017-08-27 DIAGNOSIS — Z79899 Other long term (current) drug therapy: Secondary | ICD-10-CM | POA: Diagnosis not present

## 2017-08-27 DIAGNOSIS — N390 Urinary tract infection, site not specified: Secondary | ICD-10-CM | POA: Diagnosis not present

## 2017-08-27 DIAGNOSIS — Z7982 Long term (current) use of aspirin: Secondary | ICD-10-CM | POA: Diagnosis not present

## 2017-08-27 DIAGNOSIS — Z86011 Personal history of benign neoplasm of the brain: Secondary | ICD-10-CM | POA: Diagnosis not present

## 2017-08-27 DIAGNOSIS — E119 Type 2 diabetes mellitus without complications: Secondary | ICD-10-CM | POA: Insufficient documentation

## 2017-08-27 DIAGNOSIS — I5032 Chronic diastolic (congestive) heart failure: Secondary | ICD-10-CM | POA: Diagnosis not present

## 2017-08-27 DIAGNOSIS — Z8673 Personal history of transient ischemic attack (TIA), and cerebral infarction without residual deficits: Secondary | ICD-10-CM | POA: Diagnosis not present

## 2017-08-27 DIAGNOSIS — Z7984 Long term (current) use of oral hypoglycemic drugs: Secondary | ICD-10-CM | POA: Diagnosis not present

## 2017-08-27 DIAGNOSIS — F17210 Nicotine dependence, cigarettes, uncomplicated: Secondary | ICD-10-CM | POA: Diagnosis not present

## 2017-08-27 DIAGNOSIS — R319 Hematuria, unspecified: Secondary | ICD-10-CM | POA: Diagnosis present

## 2017-08-27 LAB — URINALYSIS, ROUTINE W REFLEX MICROSCOPIC
Bilirubin Urine: NEGATIVE
Glucose, UA: NEGATIVE mg/dL
Ketones, ur: NEGATIVE mg/dL
Nitrite: NEGATIVE
PH: 8 (ref 5.0–8.0)
Protein, ur: >=300 mg/dL — AB
SPECIFIC GRAVITY, URINE: 1.011 (ref 1.005–1.030)

## 2017-08-27 MED ORDER — CEPHALEXIN 250 MG PO CAPS
500.0000 mg | ORAL_CAPSULE | Freq: Once | ORAL | Status: AC
  Administered 2017-08-27: 500 mg via ORAL
  Filled 2017-08-27: qty 2

## 2017-08-27 MED ORDER — CEPHALEXIN 500 MG PO CAPS: 500.0000 mg | ORAL_CAPSULE | Freq: Four times a day (QID) | ORAL | 0 refills | Status: DC

## 2017-08-27 NOTE — ED Triage Notes (Signed)
Patient complains of recurrent hematuria and bladder pain since am, hx of same when he had UTI

## 2017-08-27 NOTE — Discharge Instructions (Signed)
Please return with any concerns. °

## 2017-08-27 NOTE — ED Provider Notes (Signed)
[facility] DEPT Provider Note   CSN: [id number] Arrival date & time: 08/27/17  1314     History   Chief Complaint Chief Complaint  Patient presents with  . Hematuria    HPI [name] is a 66 y.o. male.  HPI   Patient is a 66 year old male presenting with symptoms of urinary tract infection. He says "I know what this is, its  an urinary tract infection". He has appointment with a specialist in a week and half. He is here for antibiotics. No nausea and vomiting. No back pain. He has no allergies.  Past Medical History:  Diagnosis Date  . Alcohol dependence ([place]) 12/10/2013  . Angioedema of lips 12/24/2013  . Brain tumor ([place])   . CHF (congestive heart failure) (HCC)    diastolic  . Chronic back pain   . Chronic headache   . CVA (cerebral vascular accident) ([place])   . Diabetes mellitus   . HTN (hypertension), malignant 09/06/2012  . Hypertension   . Narcotic abuse   . Normal cardiac stress test 2009  . TIA (transient ischemic attack) 09/06/2012  . Tobacco use disorder 12/10/2013  . Vertigo 09/06/2012    Patient Active Problem List   Diagnosis Date Noted  . Syncope 01/23/2017  . Elevated troponin   . Nausea 11/01/2016  . Dysuria 11/01/2016  . Chronic diastolic heart failure ([place]) 03/20/2015  . Chest pain 03/18/2015  . History of TIA (transient ischemic attack) 01/27/2015  . Enterococcus UTI 01/27/2015  . Cough 01/27/2015  . Bronchitis 01/27/2015  . HCAP (healthcare-associated pneumonia)   . CHF (congestive heart failure) ([place]) 01/26/2015  . History of alcohol abuse 01/26/2015  . Pyelonephritis 01/12/2015  . Prostatitis 01/12/2015  . Essential hypertension 11/03/2014  . Hypokalemia 11/03/2014  . Fever 08/15/2014  . Chronic pain 12/24/2013  . Tobacco use disorder 12/10/2013  . Leukocytosis 12/10/2013  . Diabetes mellitus type 2, controlled ([place]) 09/06/2012  . Pain in the side 09/06/2012  . Pituitary adenoma ([place]) 09/06/2012    Past Surgical History:    Procedure Laterality Date  . NO PAST SURGERIES         Home Medications    Prior to Admission medications   Medication Sig Start Date End Date Taking? Authorizing Provider  albuterol (PROVENTIL HFA;VENTOLIN HFA) 108 (90 Base) MCG/ACT inhaler Inhale 1-2 puffs into the lungs every 6 (six) hours as needed for wheezing. 09/04/16   Tanna Furry, MD  amLODipine (NORVASC) 10 MG tablet Take 10 mg by mouth daily.    [provider]  aspirin EC 81 MG tablet Take 81 mg by mouth daily.    [provider]  b complex vitamins tablet Take 1 tablet by mouth daily.    [provider]  cyclobenzaprine (FLEXERIL) 10 MG tablet Take 1 tablet (10 mg total) by mouth 3 (three) times daily as needed for muscle spasms. 05/21/16   Lawyer, Harrell Gave, PA-C  gabapentin (NEURONTIN) 100 MG capsule Take 100 mg by mouth 3 (three) times daily. 06/10/16   [provider]  hydrALAZINE (APRESOLINE) 100 MG tablet Take 1 tablet (100 mg total) by mouth every 8 (eight) hours. 01/30/15   Reyne Dumas, MD  HYDROcodone-acetaminophen (NORCO/VICODIN) 5-325 MG tablet Take 1 tablet by mouth every 6 (six) hours as needed for moderate pain. 05/21/16   Lawyer, Harrell Gave, PA-C  hydrocortisone (ANUSOL-HC) 25 MG suppository Place 1 suppository (25 mg total) rectally 2 (two) times daily. 09/04/16   Tanna Furry, MD  ibuprofen (ADVIL,MOTRIN) 800 MG tablet  Take 800 mg by mouth every 8 (eight) hours as needed. pain 06/10/16   [provider]  isosorbide dinitrate (ISORDIL) 10 MG tablet Take 1 tablet (10 mg total) by mouth 3 (three) times daily. 12/27/13   Debbe Odea, MD  metFORMIN (GLUCOPHAGE) 500 MG tablet Take 500 mg by mouth 2 (two) times daily with a meal.    [provider]  naproxen (NAPROSYN) 375 MG tablet Take 1 tablet (375 mg total) by mouth 2 (two) times daily. 01/03/16   [name], MD  ondansetron (ZOFRAN) 4 MG tablet Take 1 tablet (4 mg total) by mouth every 6 (six) hours. Patient  taking differently: Take 4 mg by mouth every 8 (eight) hours as needed for nausea.  03/20/16   Montine Circle, PA-C  oxyCODONE-acetaminophen (PERCOCET/ROXICET) 5-325 MG tablet Take 1 tablet by mouth every 8 (eight) hours as needed for moderate pain.  06/10/16   [provider]  pantoprazole (PROTONIX) 20 MG tablet Take 1 tablet (20 mg total) by mouth daily. 07/02/15   [name], MD  pravastatin (PRAVACHOL) 20 MG tablet Take 20 mg by mouth at bedtime.    [provider]  predniSONE (DELTASONE) 20 MG tablet Take 1 tablet (20 mg total) by mouth 2 (two) times daily with a meal. 09/04/16   Tanna Furry, MD  tamsulosin (FLOMAX) 0.4 MG CAPS capsule Take 1 capsule (0.4 mg total) by mouth daily. 07/18/16   Barrett, Lahoma Crocker, PA-C    Family History Family History  Problem Relation Age of Onset  . CAD Father        "heart attack in his 79's"  . Hypertension Father   . Diabetes Brother   . Diabetes Sister   . Hypertension Mother     Social History Social History  Substance Use Topics  . Smoking status: Current Every Day Smoker    Packs/day: 0.50    Types: Cigarettes  . Smokeless tobacco: Current User  . Alcohol use Yes     Allergies   Patient has no known allergies.   Review of Systems Review of Systems  Constitutional: Positive for fatigue. Negative for activity change and fever.  Respiratory: Negative for shortness of breath.   Cardiovascular: Negative for chest pain.  Gastrointestinal: Negative for abdominal pain, diarrhea and vomiting.  Genitourinary: Positive for dysuria.     Physical Exam Updated Vital Signs BP (!) 170/65 (BP Location: Right Arm)   Pulse 75   Temp 98.7 F (37.1 C) (Oral)   Resp 16   SpO2 100%   Physical Exam  Constitutional: He is oriented to person, place, and time. He appears well-nourished.  HENT:  Head: Normocephalic.  Eyes: Conjunctivae are normal.  Cardiovascular: Normal rate and regular rhythm.   No murmur  heard. Pulmonary/Chest: Effort normal and breath sounds normal. No respiratory distress.  Abdominal: Soft. He exhibits no distension. There is no tenderness.  Neurological: He is oriented to person, place, and time.  Skin: Skin is warm and dry. He is not diaphoretic.  Psychiatric: He has a normal mood and affect. His behavior is normal.     ED Treatments / Results  Labs (all labs ordered are listed, but only abnormal results are displayed) Labs Reviewed  URINALYSIS, ROUTINE W REFLEX MICROSCOPIC - Abnormal; Notable for the following:       Result Value   Color, Urine AMBER (*)    APPearance CLOUDY (*)    Hgb urine dipstick LARGE (*)    Protein, ur >=300 (*)  Leukocytes, UA TRACE (*)    Bacteria, UA MANY (*)    Squamous Epithelial / LPF 0-5 (*)    Non Squamous Epithelial 0-5 (*)    All other components within normal limits    EKG  EKG Interpretation None       Radiology No results found.  Procedures Procedures (including critical care time)  Medications Ordered in ED Medications - No data to display   Initial Impression / Assessment and Plan / ED Course  I have reviewed the triage vital signs and the nursing notes.  Pertinent labs & imaging results that were available during my care of the patient were reviewed by me and considered in my medical decision making (see chart for details).     Very pleasant 66 year old male presenting with urinary tract in symptoms. Eating drinking normally. Normal vital signs Reassuring physical exam. We'll start antibioitcs and have him follow-up with his primary care physician and specialist./   Final Clinical Impressions(s) / ED Diagnoses   Final diagnoses:  None    New Prescriptions New Prescriptions   No medications on file     Macarthur Critchley, MD 08/27/17 1843

## 2017-08-30 LAB — URINE CULTURE

## 2017-08-31 ENCOUNTER — Telehealth: Payer: Self-pay | Admitting: *Deleted

## 2017-08-31 NOTE — Progress Notes (Signed)
ED Antimicrobial Stewardship Positive Culture Follow Up   [name] is an 66 y.o. male who presented to [facility] on 08/27/2017 with a chief complaint of  Chief Complaint  Patient presents with  . Hematuria    Recent Results (from the past 720 hour(s))  Urine culture     Status: Abnormal   Collection Time: 08/27/17  4:50 PM  Result Value Ref Range Status   Specimen Description URINE, CLEAN CATCH  Final   Special Requests NONE  Final   Culture >=100,000 COLONIES/mL ESCHERICHIA COLI (A)  Final   Report Status 08/30/2017 FINAL  Final   Organism ID, Bacteria ESCHERICHIA COLI (A)  Final      Susceptibility   Escherichia coli - MIC*    AMPICILLIN >=32 RESISTANT Resistant     CEFAZOLIN >=64 RESISTANT Resistant     CEFTRIAXONE 2 SENSITIVE Sensitive     CIPROFLOXACIN >=4 RESISTANT Resistant     GENTAMICIN <=1 SENSITIVE Sensitive     IMIPENEM <=0.25 SENSITIVE Sensitive     NITROFURANTOIN <=16 SENSITIVE Sensitive     TRIMETH/SULFA <=20 SENSITIVE Sensitive     AMPICILLIN/SULBACTAM >=32 RESISTANT Resistant     PIP/TAZO 64 INTERMEDIATE Intermediate     Extended ESBL NEGATIVE Sensitive     * >=100,000 COLONIES/mL ESCHERICHIA COLI    [x]  Treated with cephalexin, organism resistant to prescribed antimicrobial []  Patient discharged originally without antimicrobial agent and treatment is now indicated  New antibiotic prescription: Bactrim DS- 1 tablet BID X 7 days Stop cephalexin  ED Provider: Marrianne Mood, PA-C   Susa Raring, PharmD 08/31/2017, 9:53 AM Infectious Diseases Pharmacy Resident  Phone# [phone]

## 2017-08-31 NOTE — Telephone Encounter (Signed)
Post ED Visit - Positive Culture Follow-up: Successful Patient Follow-Up  Culture assessed and recommendations reviewed by: []  Elenor Quinones, Pharm.D. []  Heide Guile, Pharm.D., BCPS AQ-ID []  Parks Neptune, Pharm.D., BCPS []  Alycia Rossetti, Pharm.D., BCPS []  [place], Pharm.D., BCPS, AAHIVP []  Legrand Como, Pharm.D., BCPS, AAHIVP []  Salome Arnt, PharmD, BCPS []  Dimitri Ped, PharmD, BCPS []  Vincenza Hews, PharmD, BCPS Jimmy Footman, Pharm D  Positive urine culture  []  Patient discharged without antimicrobial prescription and treatment is now indicated [x]  Organism is resistant to prescribed ED discharge antimicrobial []  Patient with positive blood cultures  Changes discussed with ED provider:Tyler Leaphart, PA-C New antibiotic prescription Bactrim DS 1 tablet PO BID x 7 days Called to [facility], [phone]  Contacted patient, date 08/31/2017, time [place], [place] 08/31/2017, 10:19 AM

## 2017-09-01 MED ORDER — CEPHALEXIN 500 MG PO CAPS: 500.0000 mg | ORAL_CAPSULE | Freq: Four times a day (QID) | ORAL | 0 refills | Status: DC

## 2017-12-02 ENCOUNTER — Encounter (HOSPITAL_COMMUNITY): Payer: Self-pay | Admitting: Emergency Medicine

## 2017-12-02 ENCOUNTER — Emergency Department (HOSPITAL_COMMUNITY)
Admission: EM | Admit: 2017-12-02 | Discharge: 2017-12-03 | Disposition: A | Discharge status: Home / Self Care | Payer: Medicare HMO | Attending: Emergency Medicine | Admitting: Emergency Medicine

## 2017-12-02 ENCOUNTER — Other Ambulatory Visit: Payer: Self-pay

## 2017-12-02 DIAGNOSIS — Z79899 Other long term (current) drug therapy: Secondary | ICD-10-CM | POA: Insufficient documentation

## 2017-12-02 DIAGNOSIS — Z7982 Long term (current) use of aspirin: Secondary | ICD-10-CM | POA: Insufficient documentation

## 2017-12-02 DIAGNOSIS — R339 Retention of urine, unspecified: Secondary | ICD-10-CM | POA: Diagnosis not present

## 2017-12-02 DIAGNOSIS — F17210 Nicotine dependence, cigarettes, uncomplicated: Secondary | ICD-10-CM | POA: Diagnosis not present

## 2017-12-02 DIAGNOSIS — N390 Urinary tract infection, site not specified: Secondary | ICD-10-CM | POA: Diagnosis not present

## 2017-12-02 DIAGNOSIS — I5032 Chronic diastolic (congestive) heart failure: Secondary | ICD-10-CM | POA: Diagnosis not present

## 2017-12-02 DIAGNOSIS — E119 Type 2 diabetes mellitus without complications: Secondary | ICD-10-CM | POA: Insufficient documentation

## 2017-12-02 DIAGNOSIS — I110 Hypertensive heart disease with heart failure: Secondary | ICD-10-CM | POA: Insufficient documentation

## 2017-12-02 DIAGNOSIS — Z7984 Long term (current) use of oral hypoglycemic drugs: Secondary | ICD-10-CM | POA: Diagnosis not present

## 2017-12-02 DIAGNOSIS — R300 Dysuria: Secondary | ICD-10-CM | POA: Diagnosis present

## 2017-12-02 LAB — URINALYSIS, ROUTINE W REFLEX MICROSCOPIC
BILIRUBIN URINE: NEGATIVE
Glucose, UA: NEGATIVE mg/dL
Ketones, ur: 5 mg/dL — AB
Nitrite: NEGATIVE
PH: 7 (ref 5.0–8.0)
Protein, ur: 100 mg/dL — AB
SPECIFIC GRAVITY, URINE: 1.017 (ref 1.005–1.030)
SQUAMOUS EPITHELIAL / LPF: NONE SEEN

## 2017-12-02 LAB — BASIC METABOLIC PANEL
Anion gap: 16 — ABNORMAL HIGH (ref 5–15)
BUN: 31 mg/dL — AB (ref 6–20)
CO2: 20 mmol/L — AB (ref 22–32)
Calcium: 9.7 mg/dL (ref 8.9–10.3)
Chloride: 101 mmol/L (ref 101–111)
Creatinine, Ser: 1.04 mg/dL (ref 0.61–1.24)
GFR calc Af Amer: >60 mL/min (ref >60)
GLUCOSE: 183 mg/dL — AB (ref 65–99)
POTASSIUM: 3.6 mmol/L (ref 3.5–5.1)
Sodium: 137 mmol/L (ref 135–145)

## 2017-12-02 LAB — CBC
HCT: 45 % (ref 39.0–52.0)
Hemoglobin: 14.4 g/dL (ref 13.0–17.0)
MCH: 23.4 pg — AB (ref 26.0–34.0)
MCHC: 32 g/dL (ref 30.0–36.0)
MCV: 73.2 fL — AB (ref 78.0–100.0)
PLATELETS: 271 10*3/uL (ref 150–400)
RBC: 6.15 MIL/uL — AB (ref 4.22–5.81)
RDW: 14.8 % (ref 11.5–15.5)
WBC: 10.7 10*3/uL — ABNORMAL HIGH (ref 4.0–10.5)

## 2017-12-02 MED ORDER — ACETAMINOPHEN 325 MG PO TABS
650.0000 mg | ORAL_TABLET | Freq: Once | ORAL | Status: AC
Start: 2017-12-02 — End: 2017-12-02
  Administered 2017-12-02: 650 mg via ORAL
  Filled 2017-12-02: qty 2

## 2017-12-02 MED ORDER — SODIUM CHLORIDE 0.9 % IV BOLUS (SEPSIS)
1000.0000 mL | Freq: Once | INTRAVENOUS | Status: AC
  Administered 2017-12-02: 1000 mL via INTRAVENOUS

## 2017-12-02 MED ORDER — MORPHINE SULFATE (PF) 4 MG/ML IV SOLN
4.0000 mg | Freq: Once | INTRAVENOUS | Status: AC
  Administered 2017-12-02: 4 mg via INTRAVENOUS
  Filled 2017-12-02: qty 1

## 2017-12-02 MED ORDER — DEXTROSE 5 % IV SOLN
1.0000 g | Freq: Once | INTRAVENOUS | Status: AC
  Administered 2017-12-02: 1 g via INTRAVENOUS
  Filled 2017-12-02: qty 10

## 2017-12-02 NOTE — ED Triage Notes (Signed)
Pt to ER for evaluation of lower abd pain onset 3-4 days ago. States "I think it is a UTI." pt states urine has a foul odor. Denies nausea, vomiting, diarrhea.

## 2017-12-02 NOTE — ED Notes (Signed)
Bladder Scan showed >247ml of urine.  Scanned about 10 times in diff locations, this is the highest residual number reported.

## 2017-12-02 NOTE — ED Provider Notes (Signed)
[facility] EMERGENCY DEPARTMENT Provider Note   CSN: [id number] Arrival date & time: 12/02/17  1737     History   Chief Complaint Chief Complaint  Patient presents with  . Abdominal Pain    HPI [name] is a 65 y.o. male.  HPI   66 year old male with history of chronic pain and opiate abuse, alcohol dependence, diabetes, hypertension presenting with complaints of dysuria.  Patient states since yesterday he has noticed urinary discomfort including burning in urination, with frequency and urgency and lower discomfort.  Symptom is moderate in severity.  He noticed blood in his urine.  He states symptoms felt similar to prior urinary tract infection that he had a month ago in which he was treated with antibiotic.  He does endorse some chills.  He denies fever, nausea, vomiting, chest pain, trouble breathing, bowel changes, or testicular pain.  He denies any new sexual partners.  Denies any penile discharge.    Past Medical History:  Diagnosis Date  . Alcohol dependence ([place]) 12/10/2013  . Angioedema of lips 12/24/2013  . Brain tumor ([place])   . CHF (congestive heart failure) (HCC)    diastolic  . Chronic back pain   . Chronic headache   . CVA (cerebral vascular accident) ([place])   . Diabetes mellitus   . HTN (hypertension), malignant 09/06/2012  . Hypertension   . Narcotic abuse ([place])   . Normal cardiac stress test 2009  . TIA (transient ischemic attack) 09/06/2012  . Tobacco use disorder 12/10/2013  . Vertigo 09/06/2012    Patient Active Problem List   Diagnosis Date Noted  . Syncope 01/23/2017  . Elevated troponin   . Nausea 11/01/2016  . Dysuria 11/01/2016  . Chronic diastolic heart failure ([place]) 03/20/2015  . Chest pain 03/18/2015  . History of TIA (transient ischemic attack) 01/27/2015  . Enterococcus UTI 01/27/2015  . Cough 01/27/2015  . Bronchitis 01/27/2015  . HCAP (healthcare-associated pneumonia)   . CHF (congestive heart failure) ([place])  01/26/2015  . History of alcohol abuse 01/26/2015  . Pyelonephritis 01/12/2015  . Prostatitis 01/12/2015  . Essential hypertension 11/03/2014  . Hypokalemia 11/03/2014  . Fever 08/15/2014  . Chronic pain 12/24/2013  . Tobacco use disorder 12/10/2013  . Leukocytosis 12/10/2013  . Diabetes mellitus type 2, controlled ([place]) 09/06/2012  . Pain in the side 09/06/2012  . Pituitary adenoma ([place]) 09/06/2012    Past Surgical History:  Procedure Laterality Date  . NO PAST SURGERIES         Home Medications    Prior to Admission medications   Medication Sig Start Date End Date Taking? Authorizing Provider  albuterol (PROVENTIL HFA;VENTOLIN HFA) 108 (90 Base) MCG/ACT inhaler Inhale 1-2 puffs into the lungs every 6 (six) hours as needed for wheezing. 09/04/16   Tanna Furry, MD  amLODipine (NORVASC) 10 MG tablet Take 10 mg by mouth daily.    [provider]  aspirin EC 81 MG tablet Take 81 mg by mouth daily.    [provider]  b complex vitamins tablet Take 1 tablet by mouth daily.    [provider]  cephALEXin (KEFLEX) 500 MG capsule Take 1 capsule (500 mg total) by mouth 4 (four) times daily. 08/27/17   [place], Courteney Lyn, MD  cyclobenzaprine (FLEXERIL) 10 MG tablet Take 1 tablet (10 mg total) by mouth 3 (three) times daily as needed for muscle spasms. 05/21/16   Lawyer, Harrell Gave, PA-C  gabapentin (NEURONTIN) 100 MG capsule Take 100 mg by  mouth 3 (three) times daily. 06/10/16   [provider]  hydrALAZINE (APRESOLINE) 100 MG tablet Take 1 tablet (100 mg total) by mouth every 8 (eight) hours. 01/30/15   Reyne Dumas, MD  HYDROcodone-acetaminophen (NORCO/VICODIN) 5-325 MG tablet Take 1 tablet by mouth every 6 (six) hours as needed for moderate pain. 05/21/16   Lawyer, Harrell Gave, PA-C  hydrocortisone (ANUSOL-HC) 25 MG suppository Place 1 suppository (25 mg total) rectally 2 (two) times daily. 09/04/16   Tanna Furry, MD  ibuprofen (ADVIL,MOTRIN) 800 MG  tablet Take 800 mg by mouth every 8 (eight) hours as needed. pain 06/10/16   [provider]  isosorbide dinitrate (ISORDIL) 10 MG tablet Take 1 tablet (10 mg total) by mouth 3 (three) times daily. 12/27/13   Debbe Odea, MD  metFORMIN (GLUCOPHAGE) 500 MG tablet Take 500 mg by mouth 2 (two) times daily with a meal.    [provider]  naproxen (NAPROSYN) 375 MG tablet Take 1 tablet (375 mg total) by mouth 2 (two) times daily. 01/03/16   [name], MD  ondansetron (ZOFRAN) 4 MG tablet Take 1 tablet (4 mg total) by mouth every 6 (six) hours. Patient taking differently: Take 4 mg by mouth every 8 (eight) hours as needed for nausea.  03/20/16   Montine Circle, PA-C  oxyCODONE-acetaminophen (PERCOCET/ROXICET) 5-325 MG tablet Take 1 tablet by mouth every 8 (eight) hours as needed for moderate pain.  06/10/16   [provider]  pantoprazole (PROTONIX) 20 MG tablet Take 1 tablet (20 mg total) by mouth daily. 07/02/15   [name], MD  pravastatin (PRAVACHOL) 20 MG tablet Take 20 mg by mouth at bedtime.    [provider]  predniSONE (DELTASONE) 20 MG tablet Take 1 tablet (20 mg total) by mouth 2 (two) times daily with a meal. 09/04/16   Tanna Furry, MD  tamsulosin (FLOMAX) 0.4 MG CAPS capsule Take 1 capsule (0.4 mg total) by mouth daily. 07/18/16   Barrett, Lahoma Crocker, PA-C    Family History Family History  Problem Relation Age of Onset  . CAD Father        "heart attack in his 75's"  . Hypertension Father   . Diabetes Brother   . Diabetes Sister   . Hypertension Mother     Social History Social History   Tobacco Use  . Smoking status: Current Every Day Smoker    Packs/day: 0.50    Types: Cigarettes  . Smokeless tobacco: Current User  Substance Use Topics  . Alcohol use: Yes  . Drug use: Yes    Types: Marijuana, Cocaine, IV    Comment: Heroin - one month ago     Allergies   Patient has no known allergies.   Review of Systems Review of Systems   All other systems reviewed and are negative.    Physical Exam Updated Vital Signs BP (!) 173/69 (BP Location: Right Arm)   Pulse (!) 109   Temp 99.1 F (37.3 C) (Oral)   Resp 14   SpO2 93%   Physical Exam  Constitutional: He appears well-developed and well-nourished. No distress.  HENT:  Head: Atraumatic.  Eyes: Conjunctivae are normal.  Neck: Neck supple.  Abdominal: Soft. Normal appearance. There is tenderness (Suprapubic tenderness without guarding or rebound tenderness) in the suprapubic area. There is no rebound, no guarding and no CVA tenderness.  Genitourinary:  Genitourinary Comments: Chaperone present during exam.  Normal rectal tone, no obvious mass, no thrombosed hemorrhoid, no significant stool in rectal vault, no  blood on glove.  Neurological: He is alert.  Skin: No rash noted.  Psychiatric: He has a normal mood and affect.  Nursing note and vitals reviewed.    ED Treatments / Results  Labs (all labs ordered are listed, but only abnormal results are displayed) Labs Reviewed  URINALYSIS, ROUTINE W REFLEX MICROSCOPIC - Abnormal; Notable for the following components:      Result Value   Color, Urine AMBER (*)    APPearance TURBID (*)    Hgb urine dipstick MODERATE (*)    Ketones, ur 5 (*)    Protein, ur 100 (*)    Leukocytes, UA MODERATE (*)    Bacteria, UA MANY (*)    All other components within normal limits  CBC - Abnormal; Notable for the following components:   WBC 10.7 (*)    RBC 6.15 (*)    MCV 73.2 (*)    MCH 23.4 (*)    All other components within normal limits  BASIC METABOLIC PANEL - Abnormal; Notable for the following components:   CO2 20 (*)    Glucose, Bld 183 (*)    BUN 31 (*)    Anion gap 16 (*)    All other components within normal limits  URINE CULTURE    EKG  EKG Interpretation None       Radiology No results found.  Procedures Procedures (including critical care time)  Medications Ordered in ED Medications    lidocaine (XYLOCAINE) 2 % jelly 1 application (not administered)  morphine 4 MG/ML injection 4 mg (4 mg Intravenous Given 12/02/17 2051)  cefTRIAXone (ROCEPHIN) 1 g in dextrose 5 % 50 mL IVPB (0 g Intravenous Stopped 12/02/17 2131)  sodium chloride 0.9 % bolus 1,000 mL (1,000 mLs Intravenous New Bag/Given 12/02/17 2053)  acetaminophen (TYLENOL) tablet 650 mg (650 mg Oral Given 12/02/17 2244)     Initial Impression / Assessment and Plan / ED Course  I have reviewed the triage vital signs and the nursing notes.  Pertinent labs & imaging results that were available during my care of the patient were reviewed by me and considered in my medical decision making (see chart for details).     BP (!) 171/68   Pulse (!) 111   Temp (!) 100.6 F (38.1 C) (Oral)   Resp 16   SpO2 90%    Final Clinical Impressions(s) / ED Diagnoses   Final diagnoses:  Lower urinary tract infectious disease    ED Discharge Orders        Ordered    sulfamethoxazole-trimethoprim (BACTRIM DS,SEPTRA DS) 800-160 MG tablet  2 times daily     12/03/17 0006    acetaminophen (TYLENOL) 500 MG tablet  Every 6 hours PRN     12/03/17 0006     8:38 PM Patient here with symptoms suggestive for a UTI.  Urine did show evidence consistent with a urinary tract infection.  He is found to be tachycardic with an oral temp of 99.1.  Will give IVF, pain medication, rocephin.  Urine culture sent.   10:31 PM Patient had a urine culture on 08/27/2017 positive for E. coli.  He is sensitive to ceftriaxone, nitrofurantoin, and Bactrim.  Normal rectal examination, no CVA tenderness to suggest pyelonephritis.  IV fluid and Tylenol helps improve his tachycardia.  Care discussed with Dr. Regenia Skeeter.    12:01 AM Bladder scan showing >200cc of retain urine.  Pt agrees to foley insertion.   Pt d/c home with bactrim and to f/u with  urology in 1 week for foley removal and reexamination.  Return precaution given.     Domenic Moras, PA-C 12/03/17  [place] Lights    Sherwood Gambler, MD 12/03/17 [phone]

## 2017-12-03 MED ORDER — LIDOCAINE HCL 2 % EX GEL: 1.0000 "application " | Freq: Once | CUTANEOUS | Status: DC

## 2017-12-03 MED ORDER — SULFAMETHOXAZOLE-TRIMETHOPRIM 800-160 MG PO TABS: 1.0000 | ORAL_TABLET | Freq: Two times a day (BID) | ORAL | 0 refills | Status: AC

## 2017-12-03 MED ORDER — ACETAMINOPHEN 500 MG PO TABS: 500.0000 mg | ORAL_TABLET | Freq: Four times a day (QID) | ORAL | 0 refills | Status: DC | PRN

## 2017-12-03 NOTE — Discharge Instructions (Addendum)
You have been diagnosed with having a urinary tract infection.  Please take antibiotic as prescribed for the full duration.  Take tylenol as needed for pain.  You refused foley today in the emergency department. You are retaining small amount of urine still. Follow up with your primary care provider or urologist as soon as possible for re-evaluation. Return if your condition worsen or if you have other concerns.

## 2017-12-03 NOTE — ED Provider Notes (Signed)
Pt handed off to me by previous EDPA Rona Ravens at shift change pending foley insertion. Please see previous EDPA note for full details. Briefly, pt is a 66 yo male who presents with dysuria, frequency, urgency and lower abdominal pain x 1 day. At shift change he was agreeable to foley insertion. First attempt unsuccessful.   Lab work reviewed. U/A c/w UTI. Mild leukocytosis. Creatinine normal. He was borderline febrile and also tachycardic. He received IVF analgesia and antibiotics.   Prior to d/c patient refusing foley. States he can't tolerate it and it is too painful. I spoke to pt regarding benefit of foley and risks of leaving without it.  He again refused. I offered pain medications to help tolerate foley and he declined. Exam is mostly reassuring. He has remained tachycardic in low 100s in the ED. Tylenol given for fever. He is non toxic. He is tolerating PO. He has mild suprapubic discomfort. At this time, pt will be d/c with strict ED return precautions and antibiotics. I encouraged him to f/u with PCP and urology asap, or return to ED sooner. He verbalized understanding and agreeable with plan.    Kinnie Feil, PA-C 12/03/17 0881    Sherwood Gambler, MD 12/05/17 2330

## 2017-12-05 LAB — URINE CULTURE

## 2017-12-06 ENCOUNTER — Telehealth: Payer: Self-pay

## 2017-12-06 NOTE — Telephone Encounter (Signed)
Post ED Visit - Positive Culture Follow-up  Culture report reviewed by antimicrobial stewardship pharmacist:  []  Elenor Quinones, Pharm.D. []  Heide Guile, Pharm.D., BCPS AQ-ID [x]  Parks Neptune, Pharm.D., BCPS []  Alycia Rossetti, Pharm.D., BCPS []  [place], Pharm.D., BCPS, AAHIVP []  Legrand Como, Pharm.D., BCPS, AAHIVP []  Salome Arnt, PharmD, BCPS []  Dimitri Ped, PharmD, BCPS []  Vincenza Hews, PharmD, BCPS  Positive urine culture Treated with Bactrim DS, organism sensitive to the same and no further patient follow-up is required at this time.  Genia Del 12/06/2017, 11:49 AM

## 2017-12-07 ENCOUNTER — Emergency Department (HOSPITAL_COMMUNITY)
Admission: EM | Admit: 2017-12-07 | Discharge: 2017-12-07 | Disposition: A | Discharge status: Home / Self Care | Payer: Medicare HMO | Attending: Emergency Medicine | Admitting: Emergency Medicine

## 2017-12-07 ENCOUNTER — Emergency Department (HOSPITAL_COMMUNITY): Payer: Medicare HMO

## 2017-12-07 DIAGNOSIS — I5032 Chronic diastolic (congestive) heart failure: Secondary | ICD-10-CM | POA: Insufficient documentation

## 2017-12-07 DIAGNOSIS — Z7984 Long term (current) use of oral hypoglycemic drugs: Secondary | ICD-10-CM | POA: Insufficient documentation

## 2017-12-07 DIAGNOSIS — E119 Type 2 diabetes mellitus without complications: Secondary | ICD-10-CM | POA: Insufficient documentation

## 2017-12-07 DIAGNOSIS — R509 Fever, unspecified: Secondary | ICD-10-CM | POA: Insufficient documentation

## 2017-12-07 DIAGNOSIS — R300 Dysuria: Secondary | ICD-10-CM

## 2017-12-07 DIAGNOSIS — F17210 Nicotine dependence, cigarettes, uncomplicated: Secondary | ICD-10-CM | POA: Diagnosis not present

## 2017-12-07 DIAGNOSIS — I110 Hypertensive heart disease with heart failure: Secondary | ICD-10-CM | POA: Insufficient documentation

## 2017-12-07 DIAGNOSIS — Z79899 Other long term (current) drug therapy: Secondary | ICD-10-CM | POA: Diagnosis not present

## 2017-12-07 DIAGNOSIS — R1030 Lower abdominal pain, unspecified: Secondary | ICD-10-CM | POA: Diagnosis present

## 2017-12-07 DIAGNOSIS — Z7982 Long term (current) use of aspirin: Secondary | ICD-10-CM | POA: Diagnosis not present

## 2017-12-07 DIAGNOSIS — N390 Urinary tract infection, site not specified: Secondary | ICD-10-CM | POA: Insufficient documentation

## 2017-12-07 DIAGNOSIS — R319 Hematuria, unspecified: Secondary | ICD-10-CM | POA: Insufficient documentation

## 2017-12-07 DIAGNOSIS — Z8673 Personal history of transient ischemic attack (TIA), and cerebral infarction without residual deficits: Secondary | ICD-10-CM | POA: Diagnosis not present

## 2017-12-07 LAB — I-STAT CG4 LACTIC ACID, ED
LACTIC ACID, VENOUS: 0.93 mmol/L (ref 0.5–1.9)
Lactic Acid, Venous: 2.51 mmol/L (ref 0.5–1.9)

## 2017-12-07 LAB — CBC
HCT: 37.8 % — ABNORMAL LOW (ref 39.0–52.0)
HEMOGLOBIN: 12.5 g/dL — AB (ref 13.0–17.0)
MCH: 22.9 pg — ABNORMAL LOW (ref 26.0–34.0)
MCHC: 33.1 g/dL (ref 30.0–36.0)
MCV: 69.2 fL — ABNORMAL LOW (ref 78.0–100.0)
PLATELETS: 399 10*3/uL (ref 150–400)
RBC: 5.46 MIL/uL (ref 4.22–5.81)
RDW: 14.9 % (ref 11.5–15.5)
WBC: 12.6 10*3/uL — ABNORMAL HIGH (ref 4.0–10.5)

## 2017-12-07 LAB — COMPREHENSIVE METABOLIC PANEL
ALK PHOS: 46 U/L (ref 38–126)
ALT: 18 U/L (ref 17–63)
ANION GAP: 11 (ref 5–15)
AST: 19 U/L (ref 15–41)
Albumin: 2.9 g/dL — ABNORMAL LOW (ref 3.5–5.0)
BILIRUBIN TOTAL: 0.4 mg/dL (ref 0.3–1.2)
BUN: 34 mg/dL — ABNORMAL HIGH (ref 6–20)
CALCIUM: 8.3 mg/dL — AB (ref 8.9–10.3)
CO2: 18 mmol/L — AB (ref 22–32)
CREATININE: 1.79 mg/dL — AB (ref 0.61–1.24)
Chloride: 106 mmol/L (ref 101–111)
GFR calc non Af Amer: 38 mL/min — ABNORMAL LOW (ref >60)
GFR, EST AFRICAN AMERICAN: 44 mL/min — AB (ref >60)
Glucose, Bld: 97 mg/dL (ref 65–99)
Potassium: 3.5 mmol/L (ref 3.5–5.1)
Sodium: 135 mmol/L (ref 135–145)
TOTAL PROTEIN: 6.3 g/dL — AB (ref 6.5–8.1)

## 2017-12-07 LAB — URINALYSIS, ROUTINE W REFLEX MICROSCOPIC
Bacteria, UA: NONE SEEN
Bilirubin Urine: NEGATIVE
GLUCOSE, UA: NEGATIVE mg/dL
KETONES UR: NEGATIVE mg/dL
NITRITE: NEGATIVE
PH: 7 (ref 5.0–8.0)
Specific Gravity, Urine: 1.011 (ref 1.005–1.030)
Squamous Epithelial / LPF: NONE SEEN

## 2017-12-07 LAB — LIPASE, BLOOD: Lipase: 22 U/L (ref 11–51)

## 2017-12-07 MED ORDER — PHENAZOPYRIDINE HCL 100 MG PO TABS
200.0000 mg | ORAL_TABLET | Freq: Once | ORAL | Status: AC
  Administered 2017-12-07: 200 mg via ORAL
  Filled 2017-12-07: qty 2

## 2017-12-07 MED ORDER — HYDROMORPHONE HCL 1 MG/ML IJ SOLN
1.0000 mg | Freq: Once | INTRAMUSCULAR | Status: AC
  Administered 2017-12-07: 1 mg via INTRAVENOUS
  Filled 2017-12-07: qty 1

## 2017-12-07 MED ORDER — ONDANSETRON HCL 4 MG/2ML IJ SOLN
4.0000 mg | Freq: Once | INTRAMUSCULAR | Status: AC
  Administered 2017-12-07: 4 mg via INTRAVENOUS
  Filled 2017-12-07: qty 2

## 2017-12-07 MED ORDER — PHENAZOPYRIDINE HCL 100 MG PO TABS: 100.0000 mg | ORAL_TABLET | Freq: Three times a day (TID) | ORAL | 0 refills | Status: DC

## 2017-12-07 MED ORDER — SODIUM CHLORIDE 0.9 % IV BOLUS (SEPSIS)
1000.0000 mL | Freq: Once | INTRAVENOUS | Status: AC
  Administered 2017-12-07: 1000 mL via INTRAVENOUS

## 2017-12-07 MED ORDER — CEFTRIAXONE SODIUM 1 G IJ SOLR
1.0000 g | Freq: Once | INTRAMUSCULAR | Status: AC
  Administered 2017-12-07: 1 g via INTRAVENOUS
  Filled 2017-12-07: qty 10

## 2017-12-07 MED ORDER — MORPHINE SULFATE (PF) 4 MG/ML IV SOLN
4.0000 mg | Freq: Once | INTRAVENOUS | Status: AC
  Administered 2017-12-07: 4 mg via INTRAVENOUS
  Filled 2017-12-07: qty 1

## 2017-12-07 MED ORDER — SODIUM CHLORIDE 0.9 % IV BOLUS (SEPSIS): 500.0000 mL | Freq: Once | INTRAVENOUS | Status: DC

## 2017-12-07 MED ORDER — OXYCODONE-ACETAMINOPHEN 5-325 MG PO TABS
1.0000 | ORAL_TABLET | Freq: Once | ORAL | Status: AC
  Administered 2017-12-07: 1 via ORAL
  Filled 2017-12-07: qty 1

## 2017-12-07 NOTE — ED Provider Notes (Signed)
[facility] EMERGENCY DEPARTMENT Provider Note   CSN: [id number] Arrival date & time: 12/07/17  0010     History   Chief Complaint Chief Complaint  Patient presents with  . Abdominal Pain  . Hematuria    HPI MAHIN GUARDIA is a 66 y.o. male.  The history is provided by the patient and medical records.  Abdominal Pain   Associated symptoms include hematuria.  Hematuria  Associated symptoms include abdominal pain.     66 y.o. M with hx of alcohol dependence, chronic back pain, chronic headaches, CVA, DM, HTN, narcotic abuse, vertigo, presenting to the ED for abdominal pain and hematuria.  Patient was seen here 4 days ago and diagnosed with UTI.  He was started on abx but states he has had not any relief.  States pain is actually worse.  He reports initially he just had suprapubic abdominal pain, now has pain into the left flank and back.  He reports fever at home with chills.  Continues to have grossly bloody urine and has some pain in his penis.  No testicle pain/swelling.  States he has had UTI's in the past but reports usually he gets better with antibiotics.  No documented history of kidney stones. States he also has not had a BM in about 3 days.  States he has not been eating very much though due to pain.  Past Medical History:  Diagnosis Date  . Alcohol dependence ([place]) 12/10/2013  . Angioedema of lips 12/24/2013  . Brain tumor ([place])   . CHF (congestive heart failure) ([facility])    diastolic  . Chronic back pain   . Chronic headache   . CVA (cerebral vascular accident) ([place])   . Diabetes mellitus   . HTN (hypertension), malignant 09/06/2012  . Hypertension   . Narcotic abuse ([place])   . Normal cardiac stress test 2009  . TIA (transient ischemic attack) 09/06/2012  . Tobacco use disorder 12/10/2013  . Vertigo 09/06/2012    Patient Active Problem List   Diagnosis Date Noted  . Syncope 01/23/2017  . Elevated troponin   . Nausea 11/01/2016  . Dysuria  11/01/2016  . Chronic diastolic heart failure ([place]) 03/20/2015  . Chest pain 03/18/2015  . History of TIA (transient ischemic attack) 01/27/2015  . Enterococcus UTI 01/27/2015  . Cough 01/27/2015  . Bronchitis 01/27/2015  . HCAP (healthcare-associated pneumonia)   . CHF (congestive heart failure) ([place]) 01/26/2015  . History of alcohol abuse 01/26/2015  . Pyelonephritis 01/12/2015  . Prostatitis 01/12/2015  . Essential hypertension 11/03/2014  . Hypokalemia 11/03/2014  . Fever 08/15/2014  . Chronic pain 12/24/2013  . Tobacco use disorder 12/10/2013  . Leukocytosis 12/10/2013  . Diabetes mellitus type 2, controlled ([place]) 09/06/2012  . Pain in the side 09/06/2012  . Pituitary adenoma ([place]) 09/06/2012    Past Surgical History:  Procedure Laterality Date  . NO PAST SURGERIES         Home Medications    Prior to Admission medications   Medication Sig Start Date End Date Taking? Authorizing Provider  acetaminophen (TYLENOL) 500 MG tablet Take 1 tablet (500 mg total) by mouth every 6 (six) hours as needed. 12/03/17   Domenic Moras, PA-C  albuterol (PROVENTIL HFA;VENTOLIN HFA) 108 (90 Base) MCG/ACT inhaler Inhale 1-2 puffs into the lungs every 6 (six) hours as needed for wheezing. 09/04/16   Tanna Furry, MD  amLODipine (NORVASC) 10 MG tablet Take 10 mg by mouth daily.    [provider]  aspirin EC 81 MG tablet Take 81 mg by mouth daily.    [provider]  b complex vitamins tablet Take 1 tablet by mouth daily.    [provider]  cyclobenzaprine (FLEXERIL) 10 MG tablet Take 1 tablet (10 mg total) by mouth 3 (three) times daily as needed for muscle spasms. 05/21/16   Lawyer, Harrell Gave, PA-C  gabapentin (NEURONTIN) 100 MG capsule Take 100 mg by mouth 3 (three) times daily. 06/10/16   [provider]  hydrALAZINE (APRESOLINE) 100 MG tablet Take 1 tablet (100 mg total) by mouth every 8 (eight) hours. 01/30/15   Reyne Dumas, MD  HYDROcodone-acetaminophen  (NORCO/VICODIN) 5-325 MG tablet Take 1 tablet by mouth every 6 (six) hours as needed for moderate pain. 05/21/16   Lawyer, Harrell Gave, PA-C  hydrocortisone (ANUSOL-HC) 25 MG suppository Place 1 suppository (25 mg total) rectally 2 (two) times daily. Patient taking differently: Place 25 mg rectally 2 (two) times daily as needed for hemorrhoids.  09/04/16   Tanna Furry, MD  ibuprofen (ADVIL,MOTRIN) 800 MG tablet Take 800 mg by mouth every 8 (eight) hours as needed. pain 06/10/16   [provider]  isosorbide dinitrate (ISORDIL) 10 MG tablet Take 1 tablet (10 mg total) by mouth 3 (three) times daily. 12/27/13   Debbe Odea, MD  metFORMIN (GLUCOPHAGE) 500 MG tablet Take 500 mg by mouth 2 (two) times daily with a meal.    [provider]  naproxen (NAPROSYN) 375 MG tablet Take 1 tablet (375 mg total) by mouth 2 (two) times daily. Patient taking differently: Take 375 mg by mouth 2 (two) times daily as needed for mild pain.  01/03/16   [name], MD  ondansetron (ZOFRAN) 4 MG tablet Take 1 tablet (4 mg total) by mouth every 6 (six) hours. Patient taking differently: Take 4 mg by mouth every 8 (eight) hours as needed for nausea.  03/20/16   Montine Circle, PA-C  oxyCODONE-acetaminophen (PERCOCET/ROXICET) 5-325 MG tablet Take 1 tablet by mouth every 8 (eight) hours as needed for moderate pain.  06/10/16   [provider]  pantoprazole (PROTONIX) 20 MG tablet Take 1 tablet (20 mg total) by mouth daily. 07/02/15   [name], MD  pravastatin (PRAVACHOL) 20 MG tablet Take 20 mg by mouth at bedtime.    [provider]  predniSONE (DELTASONE) 20 MG tablet Take 1 tablet (20 mg total) by mouth 2 (two) times daily with a meal. 09/04/16   Tanna Furry, MD  sulfamethoxazole-trimethoprim (BACTRIM DS,SEPTRA DS) 800-160 MG tablet Take 1 tablet by mouth 2 (two) times daily for 7 days. 12/03/17 12/10/17  Domenic Moras, PA-C  tamsulosin (FLOMAX) 0.4 MG CAPS capsule Take 1 capsule (0.4 mg  total) by mouth daily. 07/18/16   Barrett, Lahoma Crocker, PA-C    Family History Family History  Problem Relation Age of Onset  . CAD Father        "heart attack in his 47's"  . Hypertension Father   . Diabetes Brother   . Diabetes Sister   . Hypertension Mother     Social History Social History   Tobacco Use  . Smoking status: Current Every Day Smoker    Packs/day: 0.50    Types: Cigarettes  . Smokeless tobacco: Current User  Substance Use Topics  . Alcohol use: Yes  . Drug use: Yes    Types: Marijuana, Cocaine, IV    Comment: Heroin - one month ago     Allergies   Patient has no known allergies.  Review of Systems Review of Systems  Gastrointestinal: Positive for abdominal pain.  Genitourinary: Positive for hematuria.  All other systems reviewed and are negative.    Physical Exam Updated Vital Signs BP (!) 153/45 (BP Location: Left Arm)   Pulse 96   Temp 99.7 F (37.6 C) (Oral)   Resp 16   Ht 5\' 9"  (1.753 m)   Wt 72.6 kg (160 lb)   SpO2 91%   BMI 23.63 kg/m   Physical Exam  Constitutional: He is oriented to person, place, and time. He appears well-developed and well-nourished.  HENT:  Head: Normocephalic and atraumatic.  Mouth/Throat: Oropharynx is clear and moist.  Eyes: Conjunctivae and EOM are normal. Pupils are equal, round, and reactive to light.  Neck: Normal range of motion.  Cardiovascular: Normal rate, regular rhythm and normal heart sounds.  Pulmonary/Chest: Effort normal and breath sounds normal.  Abdominal: Soft. Bowel sounds are normal. There is tenderness in the suprapubic area. There is CVA tenderness (left). There is no rigidity and no guarding.  Genitourinary: Penis normal. Circumcised.  Genitourinary Comments: Exam chaperoned by RN Testicles do not appear swollen, no tenderness or masses, normal lie  Musculoskeletal: Normal range of motion.  Neurological: He is alert and oriented to person, place, and time.  Skin: Skin is warm and dry.   Psychiatric: He has a normal mood and affect.  Nursing note and vitals reviewed.    ED Treatments / Results  Labs (all labs ordered are listed, but only abnormal results are displayed) Labs Reviewed  CBC - Abnormal; Notable for the following components:      Result Value   WBC 12.6 (*)    Hemoglobin 12.5 (*)    HCT 37.8 (*)    MCV 69.2 (*)    MCH 22.9 (*)    All other components within normal limits  URINALYSIS, ROUTINE W REFLEX MICROSCOPIC - Abnormal; Notable for the following components:   Color, Urine AMBER (*)    APPearance TURBID (*)    Hgb urine dipstick LARGE (*)    Protein, ur >=300 (*)    Leukocytes, UA LARGE (*)    Non Squamous Epithelial 0-5 (*)    All other components within normal limits  COMPREHENSIVE METABOLIC PANEL - Abnormal; Notable for the following components:   CO2 18 (*)    BUN 34 (*)    Creatinine, Ser 1.79 (*)    Calcium 8.3 (*)    Total Protein 6.3 (*)    Albumin 2.9 (*)    GFR calc non Af Amer 38 (*)    GFR calc Af Amer 44 (*)    All other components within normal limits  I-STAT CG4 LACTIC ACID, ED - Abnormal; Notable for the following components:   Lactic Acid, Venous 2.51 (*)    All other components within normal limits  CULTURE, BLOOD (ROUTINE X 2)  CULTURE, BLOOD (ROUTINE X 2)  URINE CULTURE  LIPASE, BLOOD  I-STAT CG4 LACTIC ACID, ED    EKG  EKG Interpretation None       Radiology Ct Renal Stone Study  Result Date: 12/07/2017 CLINICAL DATA:  Lower abdominal and low back pain, worsening. Gross hematuria. Recent diagnosis of UTI here, 4 days ago. EXAM: CT ABDOMEN AND PELVIS WITHOUT CONTRAST TECHNIQUE: Multidetector CT imaging of the abdomen and pelvis was performed following the standard protocol without IV contrast. COMPARISON:  CT 07/05/2016 and priors, dating back to 08/06/2015. FINDINGS: Lower chest: No acute abnormality. Hepatobiliary: Stable cysts in the left  lobe. No suspicious liver lesion not on unenhanced scanning.  Gallbladder and bile ducts are unremarkable. Pancreas: Unremarkable. No pancreatic ductal dilatation or surrounding inflammatory changes. Spleen: Normal in size without focal abnormality. Adrenals/Urinary Tract: Both adrenals are unremarkable. No significant renal parenchymal lesions are evident on unenhanced scanning. No perinephric stranding or perinephric collections. Ureters are unremarkable. The urinary bladder is markedly abnormal with a large right superior diverticulum and moderate bilateral Hutch diverticula. There is irregular thickening of the urinary bladder which could represent infection or inflammation. Bladder neoplasm cannot be excluded. Overall the appearance of the bladder is substantially similar to prior studies, although the mildly worsened degree of irregular mural thickening may reflect current infection. Stomach/Bowel: No obstruction or acute inflammation of bowel. There is extensive colonic diverticulosis. The appendix is normal. Vascular/Lymphatic: The abdominal aorta is normal in caliber with extensive vascular calcifications. No pathologic adenopathy is evident in the abdomen or pelvis. Reproductive: Unremarkable Other: No ascites. Musculoskeletal: No significant skeletal lesion. IMPRESSION: 1. Unchanged urinary bladder diverticula. Mildly worsened irregular thickening of the bladder wall, nonspecific but this could represent infection. 2. Extensive colonic diverticulosis without evidence of acute diverticulitis. 3. Extensive aortic atherosclerosis. 4. Stable left lobe hepatic cysts. Electronically Signed   By: Andreas Newport M.D.   On: 12/07/2017 01:23    Procedures Procedures (including critical care time)  Medications Ordered in ED Medications  morphine 4 MG/ML injection 4 mg (4 mg Intravenous Given 12/07/17 0035)  ondansetron (ZOFRAN) injection 4 mg (4 mg Intravenous Given 12/07/17 0035)  sodium chloride 0.9 % bolus 1,000 mL (0 mLs Intravenous Stopped 12/07/17 0200)    HYDROmorphone (DILAUDID) injection 1 mg (1 mg Intravenous Given 12/07/17 0140)  oxyCODONE-acetaminophen (PERCOCET/ROXICET) 5-325 MG per tablet 1 tablet (1 tablet Oral Given 12/07/17 0301)  cefTRIAXone (ROCEPHIN) 1 g in dextrose 5 % 50 mL IVPB (0 g Intravenous Stopped 12/07/17 0434)  phenazopyridine (PYRIDIUM) tablet 200 mg (200 mg Oral Given 12/07/17 0544)     Initial Impression / Assessment and Plan / ED Course  I have reviewed the triage vital signs and the nursing notes.  Pertinent labs & imaging results that were available during my care of the patient were reviewed by me and considered in my medical decision making (see chart for details).  66 year old male here with abdominal and left-sided flank pain.  Seen here a few days ago diagnosed with a UTI, started on antibiotics but states he is not feeling better.  Has history of bladder diverticula with frequent UTIs in the past.  Reports subjective fever at home and chills, afebrile here.  Did have a documented fever ED visit 4 days ago.  Patient has suprapubic and left CVA tenderness.  No known history of kidney stones.  We will plan for screening labs, urinalysis with repeat culture, blood cultures, and CT renal study.  Initial labs with elevated lactate at 2.5, this cleared after 1 L of fluids.  Slight bump in serum creatinine to 1.79, BUN also elevated suggesting a component of dehydration.  Patient admits to not drinking much fluids at home as it hurts to urinate.  UA today with no bacteria seen, still with blood and WBC's.  Repeat culture sent.  CT renal study with bladder wall thickening but no acute stones or obstructive findings.  Patient did require a few doses of medications to get his pain controlled but now comfortable.  Given his history, do have come concern about potential reinfection.  His last culture from 12/02/2017 grew out E. coli  which was sensitive to Bactrim that he has been on.  He has grown out atypical bacteria such as  enterococcus in the past.  Will discuss with urology for recommendations.  3:46 AM Discussed with on call urology, Dr. Dayle Points-- agrees that patient's presentation and findings today are somewhat borderline given his increasing WBC count, lactic elevation which has now cleared, and mild AKI and now with left flank pain, however patient does have history of chronic back pain and no obstructive pathology or findings of pyelonephritis on CT.  Feels it is ok to leave on the bactrim as he has another 3 days of therapy, but does recommend additional dose of IV rocpehin here.  Can add pyridium on trial basis to see if this helps.  Office will be closed today (Monday) but should reopen on Tuesday for follow-up.  Patient updated, in agreement with treatment plan.  As patient was receiving IV Rocephin, began complaining of worsening pain in his genital region.  States it "burns".  Will give dose of pyridium here to try to help with dysuria.  Will d/c home when abx finished with script for pyridium, will have him continue bactrim, call urology office for follow-up.  Discussed plan with patient, he acknowledged understanding and agreed with plan of care.  Return precautions given for new or worsening symptoms.  Final Clinical Impressions(s) / ED Diagnoses   Final diagnoses:  Dysuria  Hematuria, unspecified type  Lower urinary tract infectious disease    ED Discharge Orders        Ordered    phenazopyridine (PYRIDIUM) 100 MG tablet  3 times daily     12/07/17 0544       Larene Pickett, PA-C 12/07/17 7121    Merryl Hacker, MD 12/07/17 2302

## 2017-12-07 NOTE — ED Triage Notes (Signed)
PT BIB [facility] for c/o of lower back, lower abdominal and penile pain. Pt was diagnosed with a UTI 4 days ago here. States he has been taking the medication. Over the past 2 days patient states the pain has moved from just his penis to the aforementioned. Pt is having gross hematuria also. Pt is uncomfortable during triage

## 2017-12-07 NOTE — Discharge Instructions (Signed)
Take the prescribed medication as directed.  Continue your antibiotics as well. Follow-up with urology clinic tomorrow (Tuesday)-- can call to schedule appt. Return to the ED for new or worsening symptoms.

## 2017-12-07 NOTE — ED Notes (Signed)
Recollect CMP and lipase d/t hemolyzation. Recollect urine culture d/t specimen being sent to lab in specimen cup, urine culture has to be sent down in grey top

## 2017-12-07 NOTE — ED Notes (Signed)
Went in to check on patient and he still was not getting dressed. RN told patient he needed to get dressed and RN would help him to the waiting room

## 2017-12-07 NOTE — ED Notes (Signed)
Pt understood dc material. NAD noted. Script given at dc  

## 2017-12-07 NOTE — ED Notes (Signed)
RN assisted patient to the bathroom. He gets very upset and states that urinating hurts. "he states he cannot go home like this." Provider notified

## 2017-12-07 NOTE — ED Notes (Signed)
Pt resting. NAD noted.

## 2017-12-09 LAB — URINE CULTURE

## 2017-12-11 ENCOUNTER — Encounter (HOSPITAL_COMMUNITY): Payer: Self-pay

## 2017-12-11 ENCOUNTER — Emergency Department (HOSPITAL_COMMUNITY)
Admission: EM | Admit: 2017-12-11 | Discharge: 2017-12-11 | Disposition: A | Discharge status: Home / Self Care | Payer: Medicare HMO | Attending: Emergency Medicine | Admitting: Emergency Medicine

## 2017-12-11 DIAGNOSIS — I110 Hypertensive heart disease with heart failure: Secondary | ICD-10-CM | POA: Diagnosis not present

## 2017-12-11 DIAGNOSIS — Z7902 Long term (current) use of antithrombotics/antiplatelets: Secondary | ICD-10-CM | POA: Diagnosis not present

## 2017-12-11 DIAGNOSIS — E119 Type 2 diabetes mellitus without complications: Secondary | ICD-10-CM | POA: Insufficient documentation

## 2017-12-11 DIAGNOSIS — F1210 Cannabis abuse, uncomplicated: Secondary | ICD-10-CM | POA: Insufficient documentation

## 2017-12-11 DIAGNOSIS — N410 Acute prostatitis: Secondary | ICD-10-CM | POA: Diagnosis not present

## 2017-12-11 DIAGNOSIS — F17228 Nicotine dependence, chewing tobacco, with other nicotine-induced disorders: Secondary | ICD-10-CM | POA: Insufficient documentation

## 2017-12-11 DIAGNOSIS — M549 Dorsalgia, unspecified: Secondary | ICD-10-CM | POA: Insufficient documentation

## 2017-12-11 DIAGNOSIS — Z8673 Personal history of transient ischemic attack (TIA), and cerebral infarction without residual deficits: Secondary | ICD-10-CM | POA: Insufficient documentation

## 2017-12-11 DIAGNOSIS — F1920 Other psychoactive substance dependence, uncomplicated: Secondary | ICD-10-CM | POA: Diagnosis not present

## 2017-12-11 DIAGNOSIS — F1410 Cocaine abuse, uncomplicated: Secondary | ICD-10-CM | POA: Insufficient documentation

## 2017-12-11 DIAGNOSIS — Z7982 Long term (current) use of aspirin: Secondary | ICD-10-CM | POA: Insufficient documentation

## 2017-12-11 DIAGNOSIS — F17210 Nicotine dependence, cigarettes, uncomplicated: Secondary | ICD-10-CM | POA: Insufficient documentation

## 2017-12-11 DIAGNOSIS — Z7984 Long term (current) use of oral hypoglycemic drugs: Secondary | ICD-10-CM | POA: Diagnosis not present

## 2017-12-11 DIAGNOSIS — Z79899 Other long term (current) drug therapy: Secondary | ICD-10-CM | POA: Insufficient documentation

## 2017-12-11 DIAGNOSIS — I5032 Chronic diastolic (congestive) heart failure: Secondary | ICD-10-CM | POA: Diagnosis not present

## 2017-12-11 DIAGNOSIS — R319 Hematuria, unspecified: Secondary | ICD-10-CM | POA: Diagnosis present

## 2017-12-11 LAB — COMPREHENSIVE METABOLIC PANEL
ALBUMIN: 3.7 g/dL (ref 3.5–5.0)
ALK PHOS: 54 U/L (ref 38–126)
ALT: 35 U/L (ref 17–63)
ANION GAP: 10 (ref 5–15)
AST: 42 U/L — ABNORMAL HIGH (ref 15–41)
BILIRUBIN TOTAL: 0.3 mg/dL (ref 0.3–1.2)
BUN: 18 mg/dL (ref 6–20)
CALCIUM: 9.6 mg/dL (ref 8.9–10.3)
CO2: 21 mmol/L — ABNORMAL LOW (ref 22–32)
CREATININE: 1.59 mg/dL — AB (ref 0.61–1.24)
Chloride: 105 mmol/L (ref 101–111)
GFR calc Af Amer: 51 mL/min — ABNORMAL LOW (ref >60)
GFR calc non Af Amer: 44 mL/min — ABNORMAL LOW (ref >60)
GLUCOSE: 94 mg/dL (ref 65–99)
Potassium: 4.1 mmol/L (ref 3.5–5.1)
Sodium: 136 mmol/L (ref 135–145)
TOTAL PROTEIN: 8.3 g/dL — AB (ref 6.5–8.1)

## 2017-12-11 LAB — TYPE AND SCREEN
ABO/RH(D): B POS
ANTIBODY SCREEN: NEGATIVE

## 2017-12-11 LAB — CBC
HCT: 38.7 % — ABNORMAL LOW (ref 39.0–52.0)
HEMOGLOBIN: 13 g/dL (ref 13.0–17.0)
MCH: 23.2 pg — AB (ref 26.0–34.0)
MCHC: 33.6 g/dL (ref 30.0–36.0)
MCV: 69.1 fL — ABNORMAL LOW (ref 78.0–100.0)
PLATELETS: 399 10*3/uL (ref 150–400)
RBC: 5.6 MIL/uL (ref 4.22–5.81)
RDW: 15 % (ref 11.5–15.5)
WBC: 10.9 10*3/uL — ABNORMAL HIGH (ref 4.0–10.5)

## 2017-12-11 MED ORDER — DEXTROSE 5 % IV SOLN: 1.0000 g | Freq: Once | INTRAVENOUS | Status: DC

## 2017-12-11 MED ORDER — SULFAMETHOXAZOLE-TRIMETHOPRIM 800-160 MG PO TABS
1.0000 | ORAL_TABLET | Freq: Once | ORAL | Status: AC
  Administered 2017-12-11: 1 via ORAL
  Filled 2017-12-11: qty 1

## 2017-12-11 MED ORDER — SULFAMETHOXAZOLE-TRIMETHOPRIM 800-160 MG PO TABS: 1.0000 | ORAL_TABLET | Freq: Two times a day (BID) | ORAL | 0 refills | Status: AC

## 2017-12-11 MED ORDER — OXYCODONE-ACETAMINOPHEN 5-325 MG PO TABS
1.0000 | ORAL_TABLET | Freq: Once | ORAL | Status: AC
  Administered 2017-12-11: 1 via ORAL
  Filled 2017-12-11: qty 1

## 2017-12-11 MED ORDER — OXYCODONE-ACETAMINOPHEN 5-325 MG PO TABS
1.0000 | ORAL_TABLET | ORAL | Status: DC | PRN
  Administered 2017-12-11: 1 via ORAL
  Filled 2017-12-11: qty 1

## 2017-12-11 MED ORDER — KETOROLAC TROMETHAMINE 30 MG/ML IJ SOLN
15.0000 mg | Freq: Once | INTRAMUSCULAR | Status: AC
  Administered 2017-12-11: 15 mg via INTRAVENOUS
  Filled 2017-12-11: qty 1

## 2017-12-11 NOTE — Progress Notes (Signed)
CSW provided [employer] for pt to be transferred home.   Wendelyn Breslow, [facility]  [phone]

## 2017-12-11 NOTE — ED Notes (Signed)
Informed pt. We need urine sample. Urinal at bedside

## 2017-12-11 NOTE — ED Triage Notes (Signed)
Pt from home with [facility] c.o hematuria for the past week, states he was diagnosed with a UTI last week as well. Pt endorses abd pain and lower back pain. BP 190/62 HR 84 rr18 CBG 189. Pt a.o, uncomfortable in triage.

## 2017-12-11 NOTE — ED Notes (Signed)
Pt reports that he is having GI bleeding and reports that he is in increasing pain.

## 2017-12-11 NOTE — Discharge Instructions (Signed)
TAKE THE MEDICINES PRESCRIBED FOR INFECTION OF THE PROSTATE AND SEE THE UROLOGIST.  Return to the ER if the symptoms get worse.

## 2017-12-11 NOTE — ED Notes (Signed)
Pt refusing to leave until he speaks w/ the MD.

## 2017-12-11 NOTE — ED Notes (Signed)
Pt. [name] he is too dizzy to stand up - refused orthostatic vital signs at this moment.

## 2017-12-12 LAB — CULTURE, BLOOD (ROUTINE X 2)
CULTURE: NO GROWTH
Culture: NO GROWTH
SPECIAL REQUESTS: ADEQUATE
Special Requests: ADEQUATE

## 2017-12-12 NOTE — ED Provider Notes (Signed)
[facility] EMERGENCY DEPARTMENT Provider Note   CSN: [id number] Arrival date & time: 12/11/17  1613     History   Chief Complaint Chief Complaint  Patient presents with  . Hematuria    HPI [name] is a 66 y.o. male.  HPI  66 year old with history of diabetes, hypertension, alcohol dependence, CHF, narcotic abuse comes in with chief complaint of blood in the urine and back pain.  This is patient's third visit for the same complaint.  Patient was noted to have UTI on his first visit and started on Bactrim.  Patient came to the emergency room 4 days later with same complaints, CT scan was obtained at that time without contrast which did not reveal any acute findings.  Patient was discharged with PCP follow-up.  Patient reports that his primary care doctor informed him that he does not have a UTI and asked him to come to the ER for further evaluation.  Patient continues to have pain with urination and blood in the urine.  He denies any high risk sexual behavior or penile discharge.  Patient denies any pain in the scrotum or testicle.  Review of system is positive for subjective chills, patient denies any active fevers. Pt has no associated numbness, weakness, urinary incontinence, urinary retention, bowel incontinence, pins and needle sensation in the perineal area.    Past Medical History:  Diagnosis Date  . Alcohol dependence ([place]) 12/10/2013  . Angioedema of lips 12/24/2013  . Brain tumor ([place])   . CHF (congestive heart failure) (HCC)    diastolic  . Chronic back pain   . Chronic headache   . CVA (cerebral vascular accident) ([place])   . Diabetes mellitus   . HTN (hypertension), malignant 09/06/2012  . Hypertension   . Narcotic abuse ([place])   . Normal cardiac stress test 2009  . TIA (transient ischemic attack) 09/06/2012  . Tobacco use disorder 12/10/2013  . Vertigo 09/06/2012    Patient Active Problem List   Diagnosis Date Noted  . Syncope 01/23/2017    . Elevated troponin   . Nausea 11/01/2016  . Dysuria 11/01/2016  . Chronic diastolic heart failure ([place]) 03/20/2015  . Chest pain 03/18/2015  . History of TIA (transient ischemic attack) 01/27/2015  . Enterococcus UTI 01/27/2015  . Cough 01/27/2015  . Bronchitis 01/27/2015  . HCAP (healthcare-associated pneumonia)   . CHF (congestive heart failure) ([place]) 01/26/2015  . History of alcohol abuse 01/26/2015  . Pyelonephritis 01/12/2015  . Prostatitis 01/12/2015  . Essential hypertension 11/03/2014  . Hypokalemia 11/03/2014  . Fever 08/15/2014  . Chronic pain 12/24/2013  . Tobacco use disorder 12/10/2013  . Leukocytosis 12/10/2013  . Diabetes mellitus type 2, controlled ([place]) 09/06/2012  . Pain in the side 09/06/2012  . Pituitary adenoma ([place]) 09/06/2012    Past Surgical History:  Procedure Laterality Date  . NO PAST SURGERIES         Home Medications    Prior to Admission medications   Medication Sig Start Date End Date Taking? Authorizing Provider  amLODipine (NORVASC) 10 MG tablet Take 10 mg by mouth daily.   Yes [provider]  gabapentin (NEURONTIN) 100 MG capsule Take 100 mg by mouth 3 (three) times daily. 06/10/16  Yes [provider]  hydrALAZINE (APRESOLINE) 50 MG tablet Take 50 mg by mouth 3 (three) times daily. 12/09/17  Yes [provider]  hydrochlorothiazide (MICROZIDE) 12.5 MG capsule Take 12.5 mg by mouth daily. 12/09/17  Yes [provider]  ibuprofen (ADVIL,MOTRIN) 800 MG tablet Take 800 mg by mouth every 8 (eight) hours as needed (pain).  06/10/16  Yes [provider]  metFORMIN (GLUCOPHAGE) 500 MG tablet Take 500 mg by mouth 2 (two) times daily with a meal.   Yes [provider]  tamsulosin (FLOMAX) 0.4 MG CAPS capsule Take 1 capsule (0.4 mg total) by mouth daily. 07/18/16  Yes Barrett, Lahoma Crocker, PA-C  acetaminophen (TYLENOL) 500 MG tablet Take 1 tablet (500 mg total) by mouth every 6 (six) hours as  needed. Patient taking differently: Take 500 mg by mouth every 6 (six) hours as needed for mild pain.  12/03/17   Domenic Moras, PA-C  albuterol (PROVENTIL HFA;VENTOLIN HFA) 108 (90 Base) MCG/ACT inhaler Inhale 1-2 puffs into the lungs every 6 (six) hours as needed for wheezing. 09/04/16   Tanna Furry, MD  aspirin EC 81 MG tablet Take 81 mg by mouth daily.    [provider]  b complex vitamins tablet Take 1 tablet by mouth daily.    [provider]  cyclobenzaprine (FLEXERIL) 10 MG tablet Take 1 tablet (10 mg total) by mouth 3 (three) times daily as needed for muscle spasms. 05/21/16   Lawyer, Harrell Gave, PA-C  hydrALAZINE (APRESOLINE) 100 MG tablet Take 1 tablet (100 mg total) by mouth every 8 (eight) hours. Patient not taking: Reported on 12/11/2017 01/30/15   Reyne Dumas, MD  HYDROcodone-acetaminophen (NORCO/VICODIN) 5-325 MG tablet Take 1 tablet by mouth every 6 (six) hours as needed for moderate pain. Patient not taking: Reported on 12/11/2017 05/21/16   Dalia Heading, PA-C  hydrocortisone (ANUSOL-HC) 25 MG suppository Place 1 suppository (25 mg total) rectally 2 (two) times daily. Patient taking differently: Place 25 mg rectally 2 (two) times daily as needed for hemorrhoids.  09/04/16   Tanna Furry, MD  isosorbide dinitrate (ISORDIL) 10 MG tablet Take 1 tablet (10 mg total) by mouth 3 (three) times daily. 12/27/13   Debbe Odea, MD  naproxen (NAPROSYN) 375 MG tablet Take 1 tablet (375 mg total) by mouth 2 (two) times daily. Patient taking differently: Take 375 mg by mouth 2 (two) times daily as needed for mild pain.  01/03/16   [name], MD  ondansetron (ZOFRAN) 4 MG tablet Take 1 tablet (4 mg total) by mouth every 6 (six) hours. Patient taking differently: Take 4 mg by mouth every 8 (eight) hours as needed for nausea.  03/20/16   Montine Circle, PA-C  oxyCODONE-acetaminophen (PERCOCET/ROXICET) 5-325 MG tablet Take 1 tablet by mouth every 8 (eight) hours as needed for  moderate pain.  06/10/16   [provider]  pantoprazole (PROTONIX) 20 MG tablet Take 1 tablet (20 mg total) by mouth daily. 07/02/15   [name], MD  phenazopyridine (PYRIDIUM) 100 MG tablet Take 1 tablet (100 mg total) by mouth 3 (three) times daily. 12/07/17   Larene Pickett, PA-C  pravastatin (PRAVACHOL) 20 MG tablet Take 20 mg by mouth at bedtime.    [provider]  sulfamethoxazole-trimethoprim (BACTRIM DS,SEPTRA DS) 800-160 MG tablet Take 1 tablet by mouth 2 (two) times daily. 12/11/17 01/10/18  [name], MD    Family History Family History  Problem Relation Age of Onset  . CAD Father        "heart attack in his 25's"  . Hypertension Father   . Diabetes Brother   . Diabetes Sister   . Hypertension Mother     Social History Social History   Tobacco Use  . Smoking status:  Current Every Day Smoker    Packs/day: 0.50    Types: Cigarettes  . Smokeless tobacco: Current User  Substance Use Topics  . Alcohol use: Yes  . Drug use: Yes    Types: Marijuana, Cocaine, IV    Comment: Heroin - one month ago     Allergies   Patient has no known allergies.   Review of Systems Review of Systems  Constitutional: Positive for activity change and chills.  Respiratory: Negative for shortness of breath.   Cardiovascular: Negative for chest pain.  Gastrointestinal: Negative for abdominal pain.  Musculoskeletal: Positive for back pain.  Allergic/Immunologic: Negative for immunocompromised state.  All other systems reviewed and are negative.    Physical Exam Updated Vital Signs BP (!) 144/56   Pulse 95   Temp 98.6 F (37 C) (Oral)   Resp 17   Ht 5\' 9"  (1.753 m)   Wt 72.6 kg (160 lb)   SpO2 95%   BMI 23.63 kg/m   Physical Exam  Constitutional: He is oriented to person, place, and time. He appears well-developed.  HENT:  Head: Atraumatic.  Neck: Neck supple.  Cardiovascular: Normal rate.  Pulmonary/Chest: Effort normal.  Abdominal: Soft.  There is tenderness. There is no rebound and no guarding.  Lower quadrant and suprapubic tenderness. Rectal exam revealed edematous and excruciatingly tender prostate  Musculoskeletal:  Pt has tenderness over the lumbar region No step offs, no erythema.. Able to discriminate between sharp and dull. Able to ambulate   Neurological: He is alert and oriented to person, place, and time.  Skin: Skin is warm.  Nursing note and vitals reviewed.    ED Treatments / Results  Labs (all labs ordered are listed, but only abnormal results are displayed) Labs Reviewed  COMPREHENSIVE METABOLIC PANEL - Abnormal; Notable for the following components:      Result Value   CO2 21 (*)    Creatinine, Ser 1.59 (*)    Total Protein 8.3 (*)    AST 42 (*)    GFR calc non Af Amer 44 (*)    GFR calc Af Amer 51 (*)    All other components within normal limits  CBC - Abnormal; Notable for the following components:   WBC 10.9 (*)    HCT 38.7 (*)    MCV 69.1 (*)    MCH 23.2 (*)    All other components within normal limits  URINE CULTURE  URINALYSIS, ROUTINE W REFLEX MICROSCOPIC  POC OCCULT BLOOD, ED  TYPE AND SCREEN    EKG  EKG Interpretation None       Radiology No results found.  Procedures Procedures (including critical care time)  Medications Ordered in ED Medications  oxyCODONE-acetaminophen (PERCOCET/ROXICET) 5-325 MG per tablet 1 tablet (1 tablet Oral Given 12/11/17 1945)  ketorolac (TORADOL) 30 MG/ML injection 15 mg (15 mg Intravenous Given 12/11/17 2200)  sulfamethoxazole-trimethoprim (BACTRIM DS,SEPTRA DS) 800-160 MG per tablet 1 tablet (1 tablet Oral Given 12/11/17 2218)     Initial Impression / Assessment and Plan / ED Course  I have reviewed the triage vital signs and the nursing notes.  Pertinent labs & imaging results that were available during my care of the patient were reviewed by me and considered in my medical decision making (see chart for details).     Patient  comes in with chief complaint of dysuria, hematuria.  Patient has multiple medical comorbidities, and was diagnosed with UTI a few days ago and started on Bactrim.  Urine cultures did  show Bactrim sensitive E. coli.  Patient reports persistence of symptoms despite taking antibiotics.  Given that patient's symptoms have persisted I considered prostatitis as the etiology, and the prostate exam did reveal edematous any excruciatingly tender prostate.  I spoke with the urologist, and Dr. Gloriann Loan who recommends that patient be started on Bactrim for 30 days and he will see the patient in the clinic.  Patient is having back pain along with his dysuria.  On my exam there is no flank tenderness.  And patient does not have any midline lumbar spine tenderness.  History and exam not suggestive of any infectious etiology of the back.  Strict ER return precautions have been discussed, and patient is agreeing with the plan and is comfortable with the workup done and the recommendations from the ER.    Final Clinical Impressions(s) / ED Diagnoses   Final diagnoses:  Acute prostatitis    ED Discharge Orders        Ordered    sulfamethoxazole-trimethoprim (BACTRIM DS,SEPTRA DS) 800-160 MG tablet  2 times daily     12/11/17 2104       [name], MD 12/12/17 [phone]

## 2017-12-27 ENCOUNTER — Encounter (HOSPITAL_COMMUNITY): Payer: Self-pay | Admitting: Emergency Medicine

## 2017-12-27 ENCOUNTER — Other Ambulatory Visit: Payer: Self-pay

## 2017-12-27 ENCOUNTER — Emergency Department (HOSPITAL_COMMUNITY)
Admission: EM | Admit: 2017-12-27 | Discharge: 2017-12-28 | Disposition: A | Discharge status: Home / Self Care | Payer: Medicare HMO | Source: Home / Self Care | Attending: Emergency Medicine | Admitting: Emergency Medicine

## 2017-12-27 ENCOUNTER — Emergency Department (HOSPITAL_COMMUNITY)
Admission: EM | Admit: 2017-12-27 | Discharge: 2017-12-27 | Disposition: A | Discharge status: Home / Self Care | Payer: Medicare HMO | Source: Home / Self Care | Attending: Emergency Medicine | Admitting: Emergency Medicine

## 2017-12-27 DIAGNOSIS — I5032 Chronic diastolic (congestive) heart failure: Secondary | ICD-10-CM

## 2017-12-27 DIAGNOSIS — N342 Other urethritis: Secondary | ICD-10-CM | POA: Insufficient documentation

## 2017-12-27 DIAGNOSIS — Z7902 Long term (current) use of antithrombotics/antiplatelets: Secondary | ICD-10-CM

## 2017-12-27 DIAGNOSIS — Y658 Other specified misadventures during surgical and medical care: Secondary | ICD-10-CM | POA: Insufficient documentation

## 2017-12-27 DIAGNOSIS — F17210 Nicotine dependence, cigarettes, uncomplicated: Secondary | ICD-10-CM | POA: Insufficient documentation

## 2017-12-27 DIAGNOSIS — Z7984 Long term (current) use of oral hypoglycemic drugs: Secondary | ICD-10-CM

## 2017-12-27 DIAGNOSIS — I110 Hypertensive heart disease with heart failure: Secondary | ICD-10-CM | POA: Insufficient documentation

## 2017-12-27 DIAGNOSIS — Z7982 Long term (current) use of aspirin: Secondary | ICD-10-CM

## 2017-12-27 DIAGNOSIS — E119 Type 2 diabetes mellitus without complications: Secondary | ICD-10-CM

## 2017-12-27 DIAGNOSIS — Z79899 Other long term (current) drug therapy: Secondary | ICD-10-CM | POA: Insufficient documentation

## 2017-12-27 DIAGNOSIS — R338 Other retention of urine: Secondary | ICD-10-CM

## 2017-12-27 DIAGNOSIS — T83091A Other mechanical complication of indwelling urethral catheter, initial encounter: Secondary | ICD-10-CM | POA: Insufficient documentation

## 2017-12-27 DIAGNOSIS — Z960 Presence of urogenital implants: Secondary | ICD-10-CM | POA: Diagnosis not present

## 2017-12-27 DIAGNOSIS — R310 Gross hematuria: Secondary | ICD-10-CM | POA: Insufficient documentation

## 2017-12-27 DIAGNOSIS — R339 Retention of urine, unspecified: Secondary | ICD-10-CM | POA: Insufficient documentation

## 2017-12-27 DIAGNOSIS — T839XXA Unspecified complication of genitourinary prosthetic device, implant and graft, initial encounter: Secondary | ICD-10-CM

## 2017-12-27 DIAGNOSIS — R369 Urethral discharge, unspecified: Secondary | ICD-10-CM | POA: Diagnosis present

## 2017-12-27 MED ORDER — HYDROCODONE-ACETAMINOPHEN 5-325 MG PO TABS: 1.0000 | ORAL_TABLET | Freq: Four times a day (QID) | ORAL | 0 refills | Status: DC | PRN

## 2017-12-27 MED ORDER — LIDOCAINE HCL 2 % EX GEL
1.0000 "application " | Freq: Once | CUTANEOUS | Status: AC | PRN
  Administered 2017-12-27: 1 via URETHRAL

## 2017-12-27 MED ORDER — HYDROCODONE-ACETAMINOPHEN 5-325 MG PO TABS
2.0000 | ORAL_TABLET | Freq: Once | ORAL | Status: AC
  Administered 2017-12-27: 2 via ORAL
  Filled 2017-12-27: qty 2

## 2017-12-27 NOTE — ED Notes (Signed)
MD Delo with assistance from this RN attempted placement of catheter again x2. 1st attempt with 34fr latex free, 2nd attempt with 52fr coude tipped catheter. 2nd attempt successful. Catheter returned small amount of dark red urine.

## 2017-12-27 NOTE — ED Notes (Signed)
Irrigated bladder with additional 374mL warm saline per MD Delo's order to irrigate until urine is clear. Urine currently light tan in color.

## 2017-12-27 NOTE — ED Notes (Signed)
Informed MD Delo of issues with catheterizing patient. MD to attempt placement of foley.

## 2017-12-27 NOTE — ED Triage Notes (Signed)
Pt c/o low abd pain, urinary retention. Pt had foley cath placed today with difficulty and clotting, pt states foley has now stopped draining. Pt grunting in pain

## 2017-12-27 NOTE — ED Provider Notes (Signed)
[facility] EMERGENCY DEPARTMENT Provider Note   CSN: [id number] Arrival date & time: 12/27/17  2215     History   Chief Complaint Chief Complaint  Patient presents with  . Urinary Retention    HPI [name] is a 66 y.o. male.  Patient with PMH of prostatitis, pyelonephritis, presents to the ED with a chief complaint of urinary retention.  He states that this afternoon his urethral catheter stopped draining.  He was seen last night for the same, and after some difficulty had a 16 fr coude inserted in the ED last night.  He states that the catheter was working well until this evening.  He now reports pain in his abdomen.  He denies fever, chills, nausea, or vomiting.  He states that he has an appointment with urology on Wednesday of this week.   The history is provided by the patient. No language interpreter was used.    Past Medical History:  Diagnosis Date  . Alcohol dependence ([place]) 12/10/2013  . Angioedema of lips 12/24/2013  . Brain tumor ([place])   . CHF (congestive heart failure) ([facility])    diastolic  . Chronic back pain   . Chronic headache   . CVA (cerebral vascular accident) ([place])   . Diabetes mellitus   . HTN (hypertension), malignant 09/06/2012  . Hypertension   . Narcotic abuse ([place])   . Normal cardiac stress test 2009  . TIA (transient ischemic attack) 09/06/2012  . Tobacco use disorder 12/10/2013  . Vertigo 09/06/2012    Patient Active Problem List   Diagnosis Date Noted  . Syncope 01/23/2017  . Elevated troponin   . Nausea 11/01/2016  . Dysuria 11/01/2016  . Chronic diastolic heart failure ([place]) 03/20/2015  . Chest pain 03/18/2015  . History of TIA (transient ischemic attack) 01/27/2015  . Enterococcus UTI 01/27/2015  . Cough 01/27/2015  . Bronchitis 01/27/2015  . HCAP (healthcare-associated pneumonia)   . CHF (congestive heart failure) ([place]) 01/26/2015  . History of alcohol abuse 01/26/2015  . Pyelonephritis 01/12/2015  .  Prostatitis 01/12/2015  . Essential hypertension 11/03/2014  . Hypokalemia 11/03/2014  . Fever 08/15/2014  . Chronic pain 12/24/2013  . Tobacco use disorder 12/10/2013  . Leukocytosis 12/10/2013  . Diabetes mellitus type 2, controlled ([place]) 09/06/2012  . Pain in the side 09/06/2012  . Pituitary adenoma ([place]) 09/06/2012    Past Surgical History:  Procedure Laterality Date  . NO PAST SURGERIES         Home Medications    Prior to Admission medications   Medication Sig Start Date End Date Taking? Authorizing Provider  acetaminophen (TYLENOL) 500 MG tablet Take 1 tablet (500 mg total) by mouth every 6 (six) hours as needed. Patient taking differently: Take 500 mg by mouth every 6 (six) hours as needed for mild pain.  12/03/17   Domenic Moras, PA-C  albuterol (PROVENTIL HFA;VENTOLIN HFA) 108 (90 Base) MCG/ACT inhaler Inhale 1-2 puffs into the lungs every 6 (six) hours as needed for wheezing. 09/04/16   Tanna Furry, MD  amLODipine (NORVASC) 10 MG tablet Take 10 mg by mouth daily.    [provider]  aspirin EC 81 MG tablet Take 81 mg by mouth daily.    [provider]  b complex vitamins tablet Take 1 tablet by mouth daily.    [provider]  cyclobenzaprine (FLEXERIL) 10 MG tablet Take 1 tablet (10 mg total) by mouth 3 (three) times daily as needed for muscle spasms. 05/21/16  Lawyer, Harrell Gave, PA-C  gabapentin (NEURONTIN) 100 MG capsule Take 100 mg by mouth 3 (three) times daily. 06/10/16   [provider]  hydrALAZINE (APRESOLINE) 100 MG tablet Take 1 tablet (100 mg total) by mouth every 8 (eight) hours. Patient not taking: Reported on 12/11/2017 01/30/15   Reyne Dumas, MD  hydrALAZINE (APRESOLINE) 50 MG tablet Take 50 mg by mouth 3 (three) times daily. 12/09/17   [provider]  hydrochlorothiazide (MICROZIDE) 12.5 MG capsule Take 12.5 mg by mouth daily. 12/09/17   [provider]  HYDROcodone-acetaminophen (NORCO) 5-325 MG tablet  Take 1-2 tablets by mouth every 6 (six) hours as needed. 12/27/17   Veryl Speak, MD  hydrocortisone (ANUSOL-HC) 25 MG suppository Place 1 suppository (25 mg total) rectally 2 (two) times daily. Patient taking differently: Place 25 mg rectally 2 (two) times daily as needed for hemorrhoids.  09/04/16   Tanna Furry, MD  ibuprofen (ADVIL,MOTRIN) 800 MG tablet Take 800 mg by mouth every 8 (eight) hours as needed (pain).  06/10/16   [provider]  isosorbide dinitrate (ISORDIL) 10 MG tablet Take 1 tablet (10 mg total) by mouth 3 (three) times daily. 12/27/13   Debbe Odea, MD  metFORMIN (GLUCOPHAGE) 500 MG tablet Take 500 mg by mouth 2 (two) times daily with a meal.    [provider]  naproxen (NAPROSYN) 375 MG tablet Take 1 tablet (375 mg total) by mouth 2 (two) times daily. Patient taking differently: Take 375 mg by mouth 2 (two) times daily as needed for mild pain.  01/03/16   [name], MD  ondansetron (ZOFRAN) 4 MG tablet Take 1 tablet (4 mg total) by mouth every 6 (six) hours. Patient taking differently: Take 4 mg by mouth every 8 (eight) hours as needed for nausea.  03/20/16   Montine Circle, PA-C  oxyCODONE-acetaminophen (PERCOCET/ROXICET) 5-325 MG tablet Take 1 tablet by mouth every 8 (eight) hours as needed for moderate pain.  06/10/16   [provider]  pantoprazole (PROTONIX) 20 MG tablet Take 1 tablet (20 mg total) by mouth daily. 07/02/15   [name], MD  phenazopyridine (PYRIDIUM) 100 MG tablet Take 1 tablet (100 mg total) by mouth 3 (three) times daily. 12/07/17   Larene Pickett, PA-C  pravastatin (PRAVACHOL) 20 MG tablet Take 20 mg by mouth at bedtime.    [provider]  sulfamethoxazole-trimethoprim (BACTRIM DS,SEPTRA DS) 800-160 MG tablet Take 1 tablet by mouth 2 (two) times daily. 12/11/17 01/10/18  [name], MD  tamsulosin (FLOMAX) 0.4 MG CAPS capsule Take 1 capsule (0.4 mg total) by mouth daily. 07/18/16   Barrett, Lahoma Crocker, PA-C     Family History Family History  Problem Relation Age of Onset  . CAD Father        "heart attack in his 23's"  . Hypertension Father   . Diabetes Brother   . Diabetes Sister   . Hypertension Mother     Social History Social History   Tobacco Use  . Smoking status: Current Every Day Smoker    Packs/day: 0.50    Types: Cigarettes  . Smokeless tobacco: Current User  Substance Use Topics  . Alcohol use: Yes  . Drug use: Yes    Types: Marijuana, Cocaine, IV    Comment: Heroin - one month ago     Allergies   Patient has no known allergies.   Review of Systems Review of Systems  All other systems reviewed and are negative.    Physical Exam Updated Vital  Signs BP (!) 143/58 (BP Location: Right Arm)   Pulse (!) 105   Temp 99.4 F (37.4 C) (Oral)   Resp 18   Ht 5\' 9"  (1.753 m)   Wt 72.6 kg (160 lb)   SpO2 93%   BMI 23.63 kg/m   Physical Exam  Constitutional: He is oriented to person, place, and time. He appears well-developed and well-nourished.  HENT:  Head: Normocephalic and atraumatic.  Eyes: Conjunctivae and EOM are normal. Pupils are equal, round, and reactive to light. Right eye exhibits no discharge. Left eye exhibits no discharge. No scleral icterus.  Neck: Normal range of motion. Neck supple. No JVD present.  Cardiovascular: Normal rate, regular rhythm and normal heart sounds. Exam reveals no gallop and no friction rub.  No murmur heard. Pulmonary/Chest: Effort normal and breath sounds normal. No respiratory distress. He has no wheezes. He has no rales. He exhibits no tenderness.  Abdominal: Soft. He exhibits distension. He exhibits no mass. There is no tenderness. There is no rebound and no guarding.  Distended lower abdomen, superior portion of bladder palpated just beneath the umbilicus  Genitourinary:  Genitourinary Comments: Foley catheter not in place  Musculoskeletal: Normal range of motion. He exhibits no edema or tenderness.  Neurological:  He is alert and oriented to person, place, and time.  Skin: Skin is warm and dry.  Psychiatric: He has a normal mood and affect. His behavior is normal. Judgment and thought content normal.  Nursing note and vitals reviewed.    ED Treatments / Results  Labs (all labs ordered are listed, but only abnormal results are displayed) Labs Reviewed - No data to display  EKG  EKG Interpretation None       Radiology No results found.  Procedures BLADDER CATHETERIZATION Date/Time: 12/27/2017 11:20 PM Performed by: Montine Circle, PA-C Authorized by: Montine Circle, PA-C   Consent:    Consent obtained:  Verbal   Consent given by:  Patient   Risks discussed:  Urethral injury, pain, incomplete procedure, false passage and infection   Alternatives discussed:  Delayed treatment Pre-procedure details:    Procedure purpose:  Therapeutic Anesthesia (see MAR for exact dosages):    Anesthesia method:  Topical application   Topical anesthetic:  Lidocaine gel Procedure details:    Provider performed due to:  Complicated insertion   Catheter insertion:  Indwelling   Catheter type:  Foley and triple lumen   Catheter size:  22 Fr   Bladder irrigation: no     Number of attempts:  1   Urine characteristics:  Blood-tinged Post-procedure details:    Patient tolerance of procedure:  Tolerated well, no immediate complications   (including critical care time)  Medications Ordered in ED Medications - No data to display   Initial Impression / Assessment and Plan / ED Course  I have reviewed the triage vital signs and the nursing notes.  Pertinent labs & imaging results that were available during my care of the patient were reviewed by me and considered in my medical decision making (see chart for details).     Catheter from last night had come out.  Patient began to retain urine again.  857 ml of urine in bladder.    I was asked by RN to place Foley given recent difficult passage.  A  22 Fr 3-way catheter was passed with some discomfort.  11:40 PM ~800 ml of urine passed through Foley.  Patient feels much better now.  Plan for discharge to home with  urology follow-up.  Given catheter care instructions.  Final Clinical Impressions(s) / ED Diagnoses   Final diagnoses:  Problem with Foley catheter, initial encounter [facility])  Acute urinary retention    ED Discharge Orders    None       Montine Circle, PA-C 12/28/17 Karie Mainland, MD 12/28/17 [phone]

## 2017-12-27 NOTE — ED Notes (Signed)
This RN and MD Delo irrigated bladder and aspirated clots for 20 minutes. Eventually catheter began to flow freely. Urine remained dark red, then began to get lighter. Approximate volume of irrigation at that time was 566mL input with 1555mL output.

## 2017-12-27 NOTE — ED Triage Notes (Signed)
Brought by ems for c/o urinary retention.  Endorses that this has gone on for three weeks.  Currently on antibiotics.  Per patient for colon infection.

## 2017-12-27 NOTE — ED Notes (Signed)
Attempted to place coude catheter without success.

## 2017-12-27 NOTE — Discharge Instructions (Signed)
Leave Foley catheter in place until followed up by urology.  Hydrocodone is prescribed as needed for pain.  Call [facility] on Monday to schedule a follow-up appointment this week.  Their contact information has been provided in this discharge summary for you to call and make these arrangements.

## 2017-12-27 NOTE — ED Provider Notes (Signed)
[facility] EMERGENCY DEPARTMENT Provider Note   CSN: [id number] Arrival date & time: 12/27/17  0218     History   Chief Complaint Chief Complaint  Patient presents with  . Urinary Retention    HPI [name] is a 66 y.o. male.  This patient is a 66 year old male with history of prostatitis presenting for evaluation of urinary retention.  He reports passing clots in his urine earlier today, then became unable to void this evening.  He reports pain to his suprapubic region along with swelling and fullness.  He denies any fevers or chills.  He has been seen by [facility] in the past cannot recall who his urologist is.   The history is provided by the patient.    Past Medical History:  Diagnosis Date  . Alcohol dependence ([place]) 12/10/2013  . Angioedema of lips 12/24/2013  . Brain tumor ([place])   . CHF (congestive heart failure) (HCC)    diastolic  . Chronic back pain   . Chronic headache   . CVA (cerebral vascular accident) ([place])   . Diabetes mellitus   . HTN (hypertension), malignant 09/06/2012  . Hypertension   . Narcotic abuse ([place])   . Normal cardiac stress test 2009  . TIA (transient ischemic attack) 09/06/2012  . Tobacco use disorder 12/10/2013  . Vertigo 09/06/2012    Patient Active Problem List   Diagnosis Date Noted  . Syncope 01/23/2017  . Elevated troponin   . Nausea 11/01/2016  . Dysuria 11/01/2016  . Chronic diastolic heart failure ([place]) 03/20/2015  . Chest pain 03/18/2015  . History of TIA (transient ischemic attack) 01/27/2015  . Enterococcus UTI 01/27/2015  . Cough 01/27/2015  . Bronchitis 01/27/2015  . HCAP (healthcare-associated pneumonia)   . CHF (congestive heart failure) ([place]) 01/26/2015  . History of alcohol abuse 01/26/2015  . Pyelonephritis 01/12/2015  . Prostatitis 01/12/2015  . Essential hypertension 11/03/2014  . Hypokalemia 11/03/2014  . Fever 08/15/2014  . Chronic pain 12/24/2013  . Tobacco use disorder  12/10/2013  . Leukocytosis 12/10/2013  . Diabetes mellitus type 2, controlled ([place]) 09/06/2012  . Pain in the side 09/06/2012  . Pituitary adenoma ([place]) 09/06/2012    Past Surgical History:  Procedure Laterality Date  . NO PAST SURGERIES         Home Medications    Prior to Admission medications   Medication Sig Start Date End Date Taking? Authorizing Provider  acetaminophen (TYLENOL) 500 MG tablet Take 1 tablet (500 mg total) by mouth every 6 (six) hours as needed. Patient taking differently: Take 500 mg by mouth every 6 (six) hours as needed for mild pain.  12/03/17   Domenic Moras, PA-C  albuterol (PROVENTIL HFA;VENTOLIN HFA) 108 (90 Base) MCG/ACT inhaler Inhale 1-2 puffs into the lungs every 6 (six) hours as needed for wheezing. 09/04/16   Tanna Furry, MD  amLODipine (NORVASC) 10 MG tablet Take 10 mg by mouth daily.    [provider]  aspirin EC 81 MG tablet Take 81 mg by mouth daily.    [provider]  b complex vitamins tablet Take 1 tablet by mouth daily.    [provider]  cyclobenzaprine (FLEXERIL) 10 MG tablet Take 1 tablet (10 mg total) by mouth 3 (three) times daily as needed for muscle spasms. 05/21/16   Lawyer, Harrell Gave, PA-C  gabapentin (NEURONTIN) 100 MG capsule Take 100 mg by mouth 3 (three) times daily. 06/10/16   [provider]  hydrALAZINE (APRESOLINE) 100  MG tablet Take 1 tablet (100 mg total) by mouth every 8 (eight) hours. Patient not taking: Reported on 12/11/2017 01/30/15   Reyne Dumas, MD  hydrALAZINE (APRESOLINE) 50 MG tablet Take 50 mg by mouth 3 (three) times daily. 12/09/17   [provider]  hydrochlorothiazide (MICROZIDE) 12.5 MG capsule Take 12.5 mg by mouth daily. 12/09/17   [provider]  HYDROcodone-acetaminophen (NORCO/VICODIN) 5-325 MG tablet Take 1 tablet by mouth every 6 (six) hours as needed for moderate pain. Patient not taking: Reported on 12/11/2017 05/21/16   Dalia Heading, PA-C    hydrocortisone (ANUSOL-HC) 25 MG suppository Place 1 suppository (25 mg total) rectally 2 (two) times daily. Patient taking differently: Place 25 mg rectally 2 (two) times daily as needed for hemorrhoids.  09/04/16   Tanna Furry, MD  ibuprofen (ADVIL,MOTRIN) 800 MG tablet Take 800 mg by mouth every 8 (eight) hours as needed (pain).  06/10/16   [provider]  isosorbide dinitrate (ISORDIL) 10 MG tablet Take 1 tablet (10 mg total) by mouth 3 (three) times daily. 12/27/13   Debbe Odea, MD  metFORMIN (GLUCOPHAGE) 500 MG tablet Take 500 mg by mouth 2 (two) times daily with a meal.    [provider]  naproxen (NAPROSYN) 375 MG tablet Take 1 tablet (375 mg total) by mouth 2 (two) times daily. Patient taking differently: Take 375 mg by mouth 2 (two) times daily as needed for mild pain.  01/03/16   [name], MD  ondansetron (ZOFRAN) 4 MG tablet Take 1 tablet (4 mg total) by mouth every 6 (six) hours. Patient taking differently: Take 4 mg by mouth every 8 (eight) hours as needed for nausea.  03/20/16   Montine Circle, PA-C  oxyCODONE-acetaminophen (PERCOCET/ROXICET) 5-325 MG tablet Take 1 tablet by mouth every 8 (eight) hours as needed for moderate pain.  06/10/16   [provider]  pantoprazole (PROTONIX) 20 MG tablet Take 1 tablet (20 mg total) by mouth daily. 07/02/15   [name], MD  phenazopyridine (PYRIDIUM) 100 MG tablet Take 1 tablet (100 mg total) by mouth 3 (three) times daily. 12/07/17   Larene Pickett, PA-C  pravastatin (PRAVACHOL) 20 MG tablet Take 20 mg by mouth at bedtime.    [provider]  sulfamethoxazole-trimethoprim (BACTRIM DS,SEPTRA DS) 800-160 MG tablet Take 1 tablet by mouth 2 (two) times daily. 12/11/17 01/10/18  [name], MD  tamsulosin (FLOMAX) 0.4 MG CAPS capsule Take 1 capsule (0.4 mg total) by mouth daily. 07/18/16   Barrett, Lahoma Crocker, PA-C    Family History Family History  Problem Relation Age of Onset  . CAD Father         "heart attack in his 66's"  . Hypertension Father   . Diabetes Brother   . Diabetes Sister   . Hypertension Mother     Social History Social History   Tobacco Use  . Smoking status: Current Every Day Smoker    Packs/day: 0.50    Types: Cigarettes  . Smokeless tobacco: Current User  Substance Use Topics  . Alcohol use: Yes  . Drug use: Yes    Types: Marijuana, Cocaine, IV    Comment: Heroin - one month ago     Allergies   Patient has no known allergies.   Review of Systems Review of Systems  All other systems reviewed and are negative.    Physical Exam Updated Vital Signs BP (!) 158/74 (BP Location: Right Arm)   Pulse (!) 107   Temp 98.1 F (  36.7 C) (Oral)   Resp 18   Ht 5\' 9"  (1.753 m)   Wt 72.6 kg (160 lb)   SpO2 96%   BMI 23.63 kg/m   Physical Exam  Constitutional: He is oriented to person, place, and time. He appears well-developed and well-nourished. No distress.  HENT:  Head: Normocephalic and atraumatic.  Mouth/Throat: Oropharynx is clear and moist.  Neck: Normal range of motion. Neck supple.  Cardiovascular: Normal rate and regular rhythm. Exam reveals no friction rub.  No murmur heard. Pulmonary/Chest: Effort normal and breath sounds normal. No respiratory distress. He has no wheezes. He has no rales.  Abdominal: Soft. Bowel sounds are normal. He exhibits no distension. There is tenderness.  There is suprapubic fullness and tenderness present.  Musculoskeletal: Normal range of motion. He exhibits no edema.  Neurological: He is alert and oriented to person, place, and time. Coordination normal.  Skin: Skin is warm and dry. He is not diaphoretic.  Nursing note and vitals reviewed.    ED Treatments / Results  Labs (all labs ordered are listed, but only abnormal results are displayed) Labs Reviewed  URINALYSIS, ROUTINE W REFLEX MICROSCOPIC    EKG  EKG Interpretation None       Radiology No results found.  Procedures BLADDER  CATHETERIZATION Date/Time: 12/27/2017 5:22 AM Performed by: Veryl Speak, MD Authorized by: Veryl Speak, MD   Consent:    Consent obtained:  Verbal   Consent given by:  Patient   Risks discussed:  Urethral injury and pain   Alternatives discussed:  No treatment Anesthesia (see MAR for exact dosages):    Anesthesia method:  Topical application   Topical anesthetic:  Lidocaine gel Procedure details:    Provider performed due to:  Complicated insertion   Catheter type:  Coude   Catheter size:  16 Fr   Bladder irrigation: yes     Number of attempts:  2   Urine characteristics:  Bloody Post-procedure details:    Patient tolerance of procedure:  Tolerated well, no immediate complications Comments:     Multiple clots expressed.  The bladder was copiously irrigated to is clear as possible.   (including critical care time)  Medications Ordered in ED Medications  lidocaine (XYLOCAINE) 2 % jelly 1 application (1 application Urethral Given 12/27/17 0240)     Initial Impression / Assessment and Plan / ED Course  I have reviewed the triage vital signs and the nursing notes.  Pertinent labs & imaging results that were available during my care of the patient were reviewed by me and considered in my medical decision making (see chart for details).  Patient presenting with urinary retention apparently due to blood clots.  He has been passing clots earlier today prior to being unable to void.  A Foley catheter was placed with some resistance, however ultimately was successful.  Multiple, multiple clots were expressed and the bladder was copiously irrigated with normal saline.  I have discussed the care with Dr. Jeffie Pollock from urology who is recommending follow-up in the office later this week.  The patient will be discharged with pain medication and advised to follow-up.  He is to call on Monday to make these arrangements.  Final Clinical Impressions(s) / ED Diagnoses   Final diagnoses:  None      ED Discharge Orders    None       Veryl Speak, MD 12/27/17 [phone]

## 2017-12-27 NOTE — ED Notes (Signed)
Attempted to insert foley catheter per order at this time without success.  Bleeding noted from meatus prior to insertion.  Fenton Malling. To insert coude.

## 2017-12-27 NOTE — ED Notes (Signed)
Urine flow ceased.

## 2017-12-28 ENCOUNTER — Encounter (HOSPITAL_COMMUNITY): Payer: Self-pay | Admitting: Emergency Medicine

## 2017-12-28 ENCOUNTER — Emergency Department (HOSPITAL_COMMUNITY)
Admission: EM | Admit: 2017-12-28 | Discharge: 2017-12-29 | Disposition: A | Discharge status: Home / Self Care | Payer: Medicare HMO | Attending: Emergency Medicine | Admitting: Emergency Medicine

## 2017-12-28 ENCOUNTER — Other Ambulatory Visit: Payer: Self-pay

## 2017-12-28 DIAGNOSIS — N342 Other urethritis: Secondary | ICD-10-CM

## 2017-12-28 LAB — CBC WITH DIFFERENTIAL/PLATELET
BASOS ABS: 0 10*3/uL (ref 0.0–0.1)
Basophils Relative: 0 %
EOS ABS: 0.3 10*3/uL (ref 0.0–0.7)
Eosinophils Relative: 2 %
HCT: 32.6 % — ABNORMAL LOW (ref 39.0–52.0)
HEMOGLOBIN: 10.3 g/dL — AB (ref 13.0–17.0)
LYMPHS PCT: 26 %
Lymphs Abs: 3.6 10*3/uL (ref 0.7–4.0)
MCH: 22.5 pg — ABNORMAL LOW (ref 26.0–34.0)
MCHC: 31.6 g/dL (ref 30.0–36.0)
MCV: 71.2 fL — ABNORMAL LOW (ref 78.0–100.0)
Monocytes Absolute: 1.3 10*3/uL — ABNORMAL HIGH (ref 0.1–1.0)
Monocytes Relative: 9 %
NEUTROS ABS: 8.7 10*3/uL — AB (ref 1.7–7.7)
Neutrophils Relative %: 63 %
Platelets: 334 10*3/uL (ref 150–400)
RBC: 4.58 MIL/uL (ref 4.22–5.81)
RDW: 16.5 % — AB (ref 11.5–15.5)
WBC: 13.9 10*3/uL — ABNORMAL HIGH (ref 4.0–10.5)

## 2017-12-28 LAB — URINALYSIS, ROUTINE W REFLEX MICROSCOPIC
GLUCOSE, UA: NEGATIVE mg/dL
KETONES UR: 15 mg/dL — AB
NITRITE: POSITIVE — AB
Specific Gravity, Urine: >1.03 — ABNORMAL HIGH (ref 1.005–1.030)
pH: 6.5 (ref 5.0–8.0)

## 2017-12-28 LAB — BASIC METABOLIC PANEL
Anion gap: 10 (ref 5–15)
BUN: 18 mg/dL (ref 6–20)
CALCIUM: 8.9 mg/dL (ref 8.9–10.3)
CHLORIDE: 102 mmol/L (ref 101–111)
CO2: 22 mmol/L (ref 22–32)
CREATININE: 1.38 mg/dL — AB (ref 0.61–1.24)
GFR, EST NON AFRICAN AMERICAN: 52 mL/min — AB (ref >60)
Glucose, Bld: 150 mg/dL — ABNORMAL HIGH (ref 65–99)
Potassium: 4 mmol/L (ref 3.5–5.1)
SODIUM: 134 mmol/L — AB (ref 135–145)

## 2017-12-28 LAB — URINALYSIS, MICROSCOPIC (REFLEX)

## 2017-12-28 MED ORDER — DEXTROSE 5 % IV SOLN
1.0000 g | Freq: Once | INTRAVENOUS | Status: AC
  Administered 2017-12-29: 1 g via INTRAVENOUS
  Filled 2017-12-28: qty 10

## 2017-12-28 NOTE — ED Provider Notes (Signed)
[facility] EMERGENCY DEPARTMENT Provider Note   CSN: [id number] Arrival date & time: 12/28/17  2037     History   Chief Complaint Chief Complaint  Patient presents with  . Foley Catheter Obstructed    Penile Discharge     HPI [name] is a 66 y.o. male.  Patient presents to the ED with a chief complaint of catheter problem.  He states that tonight about 9 pm his catheter stopped draining.  He reports having some discharge from his urethra.  He denies pain.  Denies fevers, chills, nausea, or vomiting.  There are no other associated symptoms.  He has been on Bactrim for prostatitis.   The history is provided by the patient. No language interpreter was used.    Past Medical History:  Diagnosis Date  . Alcohol dependence ([place]) 12/10/2013  . Angioedema of lips 12/24/2013  . Brain tumor ([place])   . CHF (congestive heart failure) (HCC)    diastolic  . Chronic back pain   . Chronic headache   . CVA (cerebral vascular accident) ([place])   . Diabetes mellitus   . HTN (hypertension), malignant 09/06/2012  . Hypertension   . Narcotic abuse ([place])   . Normal cardiac stress test 2009  . TIA (transient ischemic attack) 09/06/2012  . Tobacco use disorder 12/10/2013  . Vertigo 09/06/2012    Patient Active Problem List   Diagnosis Date Noted  . Syncope 01/23/2017  . Elevated troponin   . Nausea 11/01/2016  . Dysuria 11/01/2016  . Chronic diastolic heart failure ([place]) 03/20/2015  . Chest pain 03/18/2015  . History of TIA (transient ischemic attack) 01/27/2015  . Enterococcus UTI 01/27/2015  . Cough 01/27/2015  . Bronchitis 01/27/2015  . HCAP (healthcare-associated pneumonia)   . CHF (congestive heart failure) ([place]) 01/26/2015  . History of alcohol abuse 01/26/2015  . Pyelonephritis 01/12/2015  . Prostatitis 01/12/2015  . Essential hypertension 11/03/2014  . Hypokalemia 11/03/2014  . Fever 08/15/2014  . Chronic pain 12/24/2013  . Tobacco use disorder  12/10/2013  . Leukocytosis 12/10/2013  . Diabetes mellitus type 2, controlled ([place]) 09/06/2012  . Pain in the side 09/06/2012  . Pituitary adenoma ([place]) 09/06/2012    Past Surgical History:  Procedure Laterality Date  . NO PAST SURGERIES         Home Medications    Prior to Admission medications   Medication Sig Start Date End Date Taking? Authorizing Provider  acetaminophen (TYLENOL) 500 MG tablet Take 1 tablet (500 mg total) by mouth every 6 (six) hours as needed. Patient taking differently: Take 500 mg by mouth every 6 (six) hours as needed for mild pain.  12/03/17   Domenic Moras, PA-C  albuterol (PROVENTIL HFA;VENTOLIN HFA) 108 (90 Base) MCG/ACT inhaler Inhale 1-2 puffs into the lungs every 6 (six) hours as needed for wheezing. 09/04/16   Tanna Furry, MD  amLODipine (NORVASC) 10 MG tablet Take 10 mg by mouth daily.    [provider]  aspirin EC 81 MG tablet Take 81 mg by mouth daily.    [provider]  b complex vitamins tablet Take 1 tablet by mouth daily.    [provider]  cyclobenzaprine (FLEXERIL) 10 MG tablet Take 1 tablet (10 mg total) by mouth 3 (three) times daily as needed for muscle spasms. 05/21/16   Lawyer, Harrell Gave, PA-C  gabapentin (NEURONTIN) 100 MG capsule Take 100 mg by mouth 3 (three) times daily. 06/10/16   [provider]  hydrALAZINE (APRESOLINE)  100 MG tablet Take 1 tablet (100 mg total) by mouth every 8 (eight) hours. Patient not taking: Reported on 12/11/2017 01/30/15   Reyne Dumas, MD  hydrALAZINE (APRESOLINE) 50 MG tablet Take 50 mg by mouth 3 (three) times daily. 12/09/17   [provider]  hydrochlorothiazide (MICROZIDE) 12.5 MG capsule Take 12.5 mg by mouth daily. 12/09/17   [provider]  HYDROcodone-acetaminophen (NORCO) 5-325 MG tablet Take 1-2 tablets by mouth every 6 (six) hours as needed. 12/27/17   Veryl Speak, MD  hydrocortisone (ANUSOL-HC) 25 MG suppository Place 1 suppository (25 mg  total) rectally 2 (two) times daily. Patient taking differently: Place 25 mg rectally 2 (two) times daily as needed for hemorrhoids.  09/04/16   Tanna Furry, MD  ibuprofen (ADVIL,MOTRIN) 800 MG tablet Take 800 mg by mouth every 8 (eight) hours as needed (pain).  06/10/16   [provider]  isosorbide dinitrate (ISORDIL) 10 MG tablet Take 1 tablet (10 mg total) by mouth 3 (three) times daily. 12/27/13   Debbe Odea, MD  metFORMIN (GLUCOPHAGE) 500 MG tablet Take 500 mg by mouth 2 (two) times daily with a meal.    [provider]  naproxen (NAPROSYN) 375 MG tablet Take 1 tablet (375 mg total) by mouth 2 (two) times daily. Patient taking differently: Take 375 mg by mouth 2 (two) times daily as needed for mild pain.  01/03/16   [name], MD  ondansetron (ZOFRAN) 4 MG tablet Take 1 tablet (4 mg total) by mouth every 6 (six) hours. Patient taking differently: Take 4 mg by mouth every 8 (eight) hours as needed for nausea.  03/20/16   Montine Circle, PA-C  oxyCODONE-acetaminophen (PERCOCET/ROXICET) 5-325 MG tablet Take 1 tablet by mouth every 8 (eight) hours as needed for moderate pain.  06/10/16   [provider]  pantoprazole (PROTONIX) 20 MG tablet Take 1 tablet (20 mg total) by mouth daily. 07/02/15   [name], MD  phenazopyridine (PYRIDIUM) 100 MG tablet Take 1 tablet (100 mg total) by mouth 3 (three) times daily. 12/07/17   Larene Pickett, PA-C  pravastatin (PRAVACHOL) 20 MG tablet Take 20 mg by mouth at bedtime.    [provider]  sulfamethoxazole-trimethoprim (BACTRIM DS,SEPTRA DS) 800-160 MG tablet Take 1 tablet by mouth 2 (two) times daily. 12/11/17 01/10/18  [name], MD  tamsulosin (FLOMAX) 0.4 MG CAPS capsule Take 1 capsule (0.4 mg total) by mouth daily. 07/18/16   Barrett, Lahoma Crocker, PA-C    Family History Family History  Problem Relation Age of Onset  . CAD Father        "heart attack in his 75's"  . Hypertension Father   . Diabetes  Brother   . Diabetes Sister   . Hypertension Mother     Social History Social History   Tobacco Use  . Smoking status: Current Every Day Smoker    Packs/day: 0.50    Types: Cigarettes  . Smokeless tobacco: Current User  Substance Use Topics  . Alcohol use: Yes  . Drug use: Yes    Types: Marijuana, Cocaine, IV    Comment: Heroin - one month ago     Allergies   Patient has no known allergies.   Review of Systems Review of Systems  All other systems reviewed and are negative.    Physical Exam Updated Vital Signs BP (!) 187/64 (BP Location: Right Arm)   Pulse (!) 103   Temp 99.1 F (37.3 C) (Oral)   Resp 18  Ht 5\' 9"  (1.753 m)   Wt 72.6 kg (160 lb)   SpO2 97%   BMI 23.63 kg/m   Physical Exam  Constitutional: He is oriented to person, place, and time. He appears well-developed and well-nourished.  HENT:  Head: Normocephalic and atraumatic.  Eyes: Conjunctivae and EOM are normal. Pupils are equal, round, and reactive to light. Right eye exhibits no discharge. Left eye exhibits no discharge. No scleral icterus.  Neck: Normal range of motion. Neck supple. No JVD present.  Cardiovascular: Normal rate, regular rhythm and normal heart sounds. Exam reveals no gallop and no friction rub.  No murmur heard. Pulmonary/Chest: Effort normal and breath sounds normal. No respiratory distress. He has no wheezes. He has no rales. He exhibits no tenderness.  Abdominal: Soft. He exhibits no distension and no mass. There is no tenderness. There is no rebound and no guarding.  Musculoskeletal: Normal range of motion. He exhibits no edema or tenderness.  Neurological: He is alert and oriented to person, place, and time.  Skin: Skin is warm and dry.  Psychiatric: He has a normal mood and affect. His behavior is normal. Judgment and thought content normal.  Nursing note and vitals reviewed.    ED Treatments / Results  Labs (all labs ordered are listed, but only abnormal results  are displayed) Labs Reviewed  URINALYSIS, ROUTINE W REFLEX MICROSCOPIC - Abnormal; Notable for the following components:      Result Value   Color, Urine BROWN (*)    APPearance TURBID (*)    Specific Gravity, Urine >1.030 (*)    Hgb urine dipstick LARGE (*)    Bilirubin Urine MODERATE (*)    Ketones, ur 15 (*)    Protein, ur >300 (*)    Nitrite POSITIVE (*)    Leukocytes, UA MODERATE (*)    All other components within normal limits  CBC WITH DIFFERENTIAL/PLATELET - Abnormal; Notable for the following components:   WBC 13.9 (*)    Hemoglobin 10.3 (*)    HCT 32.6 (*)    MCV 71.2 (*)    MCH 22.5 (*)    RDW 16.5 (*)    Neutro Abs 8.7 (*)    Monocytes Absolute 1.3 (*)    All other components within normal limits  BASIC METABOLIC PANEL - Abnormal; Notable for the following components:   Sodium 134 (*)    Glucose, Bld 150 (*)    Creatinine, Ser 1.38 (*)    GFR calc non Af Amer 52 (*)    All other components within normal limits  URINALYSIS, MICROSCOPIC (REFLEX) - Abnormal; Notable for the following components:   Bacteria, UA MANY (*)    Squamous Epithelial / LPF 0-5 (*)    All other components within normal limits  URINE CULTURE    EKG  EKG Interpretation None       Radiology No results found.  Procedures Procedures (including critical care time)  Medications Ordered in ED Medications  cefTRIAXone (ROCEPHIN) 1 g in dextrose 5 % 50 mL IVPB (1 g Intravenous New Bag/Given 12/29/17 0009)  azithromycin (ZITHROMAX) tablet 1,000 mg (1,000 mg Oral Given 12/29/17 0013)  doxycycline (VIBRA-TABS) tablet 100 mg (100 mg Oral Given 12/29/17 0013)     Initial Impression / Assessment and Plan / ED Course  I have reviewed the triage vital signs and the nursing notes.  Pertinent labs & imaging results that were available during my care of the patient were reviewed by me and considered in my medical decision  making (see chart for details).     Bladder scan reveals less than 100 ml  in bladder.  Bladder irrigated with 500 ml warmed saline and is functioning normally.    Discussed urethral discharge with Dr. Randal Buba, who recommends adding azithro and discharging with keflex and doxy.    Patient has follow-up with urology on Thursday.  He understands and agrees with the plan.  Final Clinical Impressions(s) / ED Diagnoses   Final diagnoses:  Urethritis    ED Discharge Orders    None       Montine Circle, PA-C 12/29/17 [id number]    Palumbo, April, MD 12/29/17 0737

## 2017-12-28 NOTE — ED Triage Notes (Signed)
Patient reports foley catheter is obstructed this evening ( no drainage at leg bag) , pt. added penile discharge today . Foley catheter was inserted yesterday here for urinary retention .

## 2017-12-29 DIAGNOSIS — N342 Other urethritis: Secondary | ICD-10-CM | POA: Diagnosis not present

## 2017-12-29 MED ORDER — CEPHALEXIN 500 MG PO CAPS: 500.0000 mg | ORAL_CAPSULE | Freq: Two times a day (BID) | ORAL | 0 refills | Status: DC

## 2017-12-29 MED ORDER — DOXYCYCLINE HYCLATE 100 MG PO TABS
100.0000 mg | ORAL_TABLET | Freq: Once | ORAL | Status: AC
Start: 2017-12-29 — End: 2017-12-29
  Administered 2017-12-29: 100 mg via ORAL
  Filled 2017-12-29: qty 1

## 2017-12-29 MED ORDER — DOXYCYCLINE HYCLATE 100 MG PO CAPS: 100.0000 mg | ORAL_CAPSULE | Freq: Two times a day (BID) | ORAL | 0 refills | Status: DC

## 2017-12-29 MED ORDER — AZITHROMYCIN 250 MG PO TABS
1000.0000 mg | ORAL_TABLET | Freq: Once | ORAL | Status: AC
  Administered 2017-12-29: 1000 mg via ORAL
  Filled 2017-12-29: qty 4

## 2017-12-29 NOTE — ED Notes (Signed)
Pt stable, ambulatory, states understanding of discharge instructions 

## 2017-12-30 LAB — URINE CULTURE: CULTURE: NO GROWTH

## 2018-01-02 ENCOUNTER — Other Ambulatory Visit: Payer: Self-pay

## 2018-01-02 ENCOUNTER — Encounter (HOSPITAL_COMMUNITY): Payer: Self-pay | Admitting: *Deleted

## 2018-01-02 ENCOUNTER — Emergency Department (HOSPITAL_COMMUNITY)
Admission: EM | Admit: 2018-01-02 | Discharge: 2018-01-02 | Disposition: A | Discharge status: Home / Self Care | Payer: Medicare HMO | Attending: Emergency Medicine | Admitting: Emergency Medicine

## 2018-01-02 DIAGNOSIS — F17210 Nicotine dependence, cigarettes, uncomplicated: Secondary | ICD-10-CM | POA: Insufficient documentation

## 2018-01-02 DIAGNOSIS — Y828 Other medical devices associated with adverse incidents: Secondary | ICD-10-CM | POA: Insufficient documentation

## 2018-01-02 DIAGNOSIS — T83098A Other mechanical complication of other indwelling urethral catheter, initial encounter: Secondary | ICD-10-CM | POA: Diagnosis not present

## 2018-01-02 DIAGNOSIS — Z7984 Long term (current) use of oral hypoglycemic drugs: Secondary | ICD-10-CM | POA: Insufficient documentation

## 2018-01-02 DIAGNOSIS — K5900 Constipation, unspecified: Secondary | ICD-10-CM | POA: Diagnosis not present

## 2018-01-02 DIAGNOSIS — Z8673 Personal history of transient ischemic attack (TIA), and cerebral infarction without residual deficits: Secondary | ICD-10-CM | POA: Diagnosis not present

## 2018-01-02 DIAGNOSIS — Z79899 Other long term (current) drug therapy: Secondary | ICD-10-CM | POA: Insufficient documentation

## 2018-01-02 DIAGNOSIS — I5032 Chronic diastolic (congestive) heart failure: Secondary | ICD-10-CM | POA: Insufficient documentation

## 2018-01-02 DIAGNOSIS — R339 Retention of urine, unspecified: Secondary | ICD-10-CM | POA: Diagnosis present

## 2018-01-02 DIAGNOSIS — T83091A Other mechanical complication of indwelling urethral catheter, initial encounter: Secondary | ICD-10-CM

## 2018-01-02 DIAGNOSIS — I110 Hypertensive heart disease with heart failure: Secondary | ICD-10-CM | POA: Insufficient documentation

## 2018-01-02 DIAGNOSIS — Z7982 Long term (current) use of aspirin: Secondary | ICD-10-CM | POA: Diagnosis not present

## 2018-01-02 DIAGNOSIS — E119 Type 2 diabetes mellitus without complications: Secondary | ICD-10-CM | POA: Insufficient documentation

## 2018-01-02 MED ORDER — POLYETHYLENE GLYCOL 3350 17 G PO PACK
17.0000 g | PACK | Freq: Once | ORAL | Status: AC
  Administered 2018-01-02: 17 g via ORAL
  Filled 2018-01-02: qty 1

## 2018-01-02 MED ORDER — POLYETHYLENE GLYCOL 3350 17 GM/SCOOP PO POWD: 17.0000 g | Freq: Two times a day (BID) | ORAL | 0 refills | Status: DC

## 2018-01-02 MED ORDER — DOCUSATE SODIUM 100 MG PO CAPS: 100.0000 mg | ORAL_CAPSULE | Freq: Two times a day (BID) | ORAL | 0 refills | Status: DC

## 2018-01-02 MED ORDER — DOCUSATE SODIUM 100 MG PO CAPS
200.0000 mg | ORAL_CAPSULE | Freq: Once | ORAL | Status: AC
  Administered 2018-01-02: 200 mg via ORAL
  Filled 2018-01-02: qty 2

## 2018-01-02 NOTE — ED Notes (Signed)
Pt bladder scanned.  Volume reads >990mL

## 2018-01-02 NOTE — ED Notes (Signed)
Pt already has coude foley in place. Dr Rex Kras ordered irrigation through current foley. This RN irrigated foley and foley did not begin working. Bag disconnected and toomey placed and able to withdraw urine. Foley now connected to a standard drainage bag and urine is flowing. Pt immediately felt relief.

## 2018-01-02 NOTE — ED Triage Notes (Signed)
Pt states foley with leg bag placed on 31st for inflamed prostate.  States acute onset decreased urine flow, lower abdominal pain and distention and urine/discharge leaking around the foley insertion site.  Pt appears in great pain.

## 2018-01-02 NOTE — ED Provider Notes (Signed)
[facility] EMERGENCY DEPARTMENT Provider Note   CSN: [id number] Arrival date & time: 01/02/18  1709     History   Chief Complaint Chief Complaint  Patient presents with  . Urinary Retention    HPI [name] is a 67 y.o. male.  67 year old male with past medical history including CHF, CVA, hypertension, polysubstance abuse who presents with Foley malfunction.  On 12/31 the patient had a Foley catheter placed for urinary retention and he has a follow-up appointment with urology in 3 days.  Urine was flowing normally overnight into today and approximately 1 hour prior to arrival to the ED he woke up and had very little urine in his leg bag.  He also noticed severe lower abdominal pain and bladder distention.  He has noticed some thick material in his urine bag.  He has been taking antibiotics as prescribed.  He denies any fevers or vomiting. He reports problems w/ constipation recently, no medications PTA.   The history is provided by the patient.    Past Medical History:  Diagnosis Date  . Alcohol dependence ([place]) 12/10/2013  . Angioedema of lips 12/24/2013  . Brain tumor ([place])   . CHF (congestive heart failure) (HCC)    diastolic  . Chronic back pain   . Chronic headache   . CVA (cerebral vascular accident) ([place])   . Diabetes mellitus   . HTN (hypertension), malignant 09/06/2012  . Hypertension   . Narcotic abuse ([place])   . Normal cardiac stress test 2009  . TIA (transient ischemic attack) 09/06/2012  . Tobacco use disorder 12/10/2013  . Vertigo 09/06/2012    Patient Active Problem List   Diagnosis Date Noted  . Syncope 01/23/2017  . Elevated troponin   . Nausea 11/01/2016  . Dysuria 11/01/2016  . Chronic diastolic heart failure ([place]) 03/20/2015  . Chest pain 03/18/2015  . History of TIA (transient ischemic attack) 01/27/2015  . Enterococcus UTI 01/27/2015  . Cough 01/27/2015  . Bronchitis 01/27/2015  . HCAP (healthcare-associated pneumonia)     . CHF (congestive heart failure) ([place]) 01/26/2015  . History of alcohol abuse 01/26/2015  . Pyelonephritis 01/12/2015  . Prostatitis 01/12/2015  . Essential hypertension 11/03/2014  . Hypokalemia 11/03/2014  . Fever 08/15/2014  . Chronic pain 12/24/2013  . Tobacco use disorder 12/10/2013  . Leukocytosis 12/10/2013  . Diabetes mellitus type 2, controlled ([place]) 09/06/2012  . Pain in the side 09/06/2012  . Pituitary adenoma ([place]) 09/06/2012    Past Surgical History:  Procedure Laterality Date  . NO PAST SURGERIES         Home Medications    Prior to Admission medications   Medication Sig Start Date End Date Taking? Authorizing Provider  acetaminophen (TYLENOL) 500 MG tablet Take 1 tablet (500 mg total) by mouth every 6 (six) hours as needed. Patient taking differently: Take 500 mg by mouth every 6 (six) hours as needed for mild pain.  12/03/17  Yes Domenic Moras, PA-C  amLODipine (NORVASC) 10 MG tablet Take 10 mg by mouth daily.   Yes [provider]  aspirin EC 81 MG tablet Take 81 mg by mouth daily.   Yes [provider]  b complex vitamins tablet Take 1 tablet by mouth daily.   Yes [provider]  cyclobenzaprine (FLEXERIL) 10 MG tablet Take 1 tablet (10 mg total) by mouth 3 (three) times daily as needed for muscle spasms. 05/21/16  Yes Lawyer, Harrell Gave, PA-C  gabapentin (NEURONTIN) 100 MG capsule Take  100 mg by mouth 3 (three) times daily. 06/10/16  Yes [provider]  hydrALAZINE (APRESOLINE) 50 MG tablet Take 50 mg by mouth 3 (three) times daily. 12/09/17  Yes [provider]  hydrochlorothiazide (MICROZIDE) 12.5 MG capsule Take 12.5 mg by mouth daily. 12/09/17  Yes [provider]  ibuprofen (ADVIL,MOTRIN) 800 MG tablet Take 800 mg by mouth every 8 (eight) hours as needed (pain).  06/10/16  Yes [provider]  isosorbide dinitrate (ISORDIL) 10 MG tablet Take 1 tablet (10 mg total) by mouth 3 (three) times daily.  12/27/13  Yes Debbe Odea, MD  metFORMIN (GLUCOPHAGE) 500 MG tablet Take 500 mg by mouth 2 (two) times daily with a meal.   Yes [provider]  naproxen (NAPROSYN) 375 MG tablet Take 1 tablet (375 mg total) by mouth 2 (two) times daily. Patient taking differently: Take 375 mg by mouth 2 (two) times daily as needed for mild pain.  01/03/16  Yes Nanavati, Ankit, MD  ondansetron (ZOFRAN) 4 MG tablet Take 1 tablet (4 mg total) by mouth every 6 (six) hours. Patient taking differently: Take 4 mg by mouth every 8 (eight) hours as needed for nausea.  03/20/16  Yes Montine Circle, PA-C  oxyCODONE-acetaminophen (PERCOCET/ROXICET) 5-325 MG tablet Take 1 tablet by mouth every 8 (eight) hours as needed for moderate pain.  06/10/16  Yes [provider]  pantoprazole (PROTONIX) 20 MG tablet Take 1 tablet (20 mg total) by mouth daily. 07/02/15  Yes [name], MD  phenazopyridine (PYRIDIUM) 100 MG tablet Take 1 tablet (100 mg total) by mouth 3 (three) times daily. 12/07/17  Yes Larene Pickett, PA-C  pravastatin (PRAVACHOL) 20 MG tablet Take 20 mg by mouth at bedtime.   Yes [provider]  sulfamethoxazole-trimethoprim (BACTRIM DS,SEPTRA DS) 800-160 MG tablet Take 1 tablet by mouth 2 (two) times daily. 12/11/17 01/10/18 Yes [name], MD  tamsulosin (FLOMAX) 0.4 MG CAPS capsule Take 1 capsule (0.4 mg total) by mouth daily. 07/18/16  Yes Barrett, Lahoma Crocker, PA-C  albuterol (PROVENTIL HFA;VENTOLIN HFA) 108 (90 Base) MCG/ACT inhaler Inhale 1-2 puffs into the lungs every 6 (six) hours as needed for wheezing. 09/04/16   Tanna Furry, MD  cephALEXin (KEFLEX) 500 MG capsule Take 1 capsule (500 mg total) by mouth 2 (two) times daily. Patient not taking: Reported on 01/02/2018 12/29/17   Montine Circle, PA-C  docusate sodium (COLACE) 100 MG capsule Take 1 capsule (100 mg total) by mouth every 12 (twelve) hours. 01/02/18   Little, Wenda Overland, MD  doxycycline (VIBRAMYCIN) 100 MG capsule Take 1  capsule (100 mg total) by mouth 2 (two) times daily. Patient not taking: Reported on 01/02/2018 12/29/17   Montine Circle, PA-C  hydrALAZINE (APRESOLINE) 100 MG tablet Take 1 tablet (100 mg total) by mouth every 8 (eight) hours. Patient not taking: Reported on 12/11/2017 01/30/15   Reyne Dumas, MD  HYDROcodone-acetaminophen (NORCO) 5-325 MG tablet Take 1-2 tablets by mouth every 6 (six) hours as needed. 12/27/17   Veryl Speak, MD  hydrocortisone (ANUSOL-HC) 25 MG suppository Place 1 suppository (25 mg total) rectally 2 (two) times daily. Patient taking differently: Place 25 mg rectally 2 (two) times daily as needed for hemorrhoids.  09/04/16   Tanna Furry, MD  polyethylene glycol powder (GLYCOLAX/MIRALAX) powder Take 17 g by mouth 2 (two) times daily. Until daily soft stools  OTC 01/02/18   Little, Wenda Overland, MD    Family History Family History  Problem Relation Age of Onset  .  CAD Father        "heart attack in his 17's"  . Hypertension Father   . Diabetes Brother   . Diabetes Sister   . Hypertension Mother     Social History Social History   Tobacco Use  . Smoking status: Current Every Day Smoker    Packs/day: 0.50    Types: Cigarettes  . Smokeless tobacco: Current User  Substance Use Topics  . Alcohol use: Yes  . Drug use: Yes    Types: Marijuana, Cocaine, IV    Comment: Heroin - one month ago     Allergies   Patient has no known allergies.   Review of Systems Review of Systems All other systems reviewed and are negative except that which was mentioned in HPI   Physical Exam Updated Vital Signs BP (!) 168/60 (BP Location: Right Arm)   Pulse 96   Temp 98.7 F (37.1 C) (Oral)   Resp 16   Ht 5\' 9"  (1.753 m)   Wt 72.6 kg (160 lb)   SpO2 98%   BMI 23.63 kg/m   Physical Exam  Constitutional: He is oriented to person, place, and time. He appears well-developed and well-nourished. He appears distressed.  In distress due to pain  HENT:  Head: Normocephalic  and atraumatic.  Eyes: Conjunctivae are normal.  Neck: Neck supple.  Cardiovascular: Normal rate, regular rhythm and normal heart sounds.  No murmur heard. Pulmonary/Chest: Effort normal and breath sounds normal.  Abdominal: Soft. Bowel sounds are normal. He exhibits no distension. There is tenderness (suprapubic).  Genitourinary:  Genitourinary Comments: Foley catheter in place with leg bag, small amount of cloudy yellow urine in bag  Musculoskeletal: He exhibits no edema.  Neurological: He is alert and oriented to person, place, and time.  Fluent speech  Skin: Skin is warm and dry.  Psychiatric: He has a normal mood and affect.  Nursing note and vitals reviewed.    ED Treatments / Results  Labs (all labs ordered are listed, but only abnormal results are displayed) Labs Reviewed - No data to display  EKG  EKG Interpretation None       Radiology No results found.  Procedures Procedures (including critical care time)  Medications Ordered in ED Medications  docusate sodium (COLACE) capsule 200 mg (not administered)  polyethylene glycol (MIRALAX / GLYCOLAX) packet 17 g (not administered)     Initial Impression / Assessment and Plan / ED Course  I have reviewed the triage vital signs and the nursing notes.      Foley blocked, nursing able to flush it with normal flow restored. On repeat exam, pt comfortable w/ relief of sx. 1223ml yellow urine in bag. Discussed supportive care, he has appt w/ urology in 3 days. Gave colace and miralax for treatment of constipation. Return precautions reviewed.  Final Clinical Impressions(s) / ED Diagnoses   Final diagnoses:  Obstruction of Foley catheter, initial encounter ([place])  Constipation, unspecified constipation type    ED Discharge Orders        Ordered    docusate sodium (COLACE) 100 MG capsule  Every 12 hours     01/02/18 2019    polyethylene glycol powder (GLYCOLAX/MIRALAX) powder  2 times daily     01/02/18 2019         Little, Wenda Overland, MD 01/02/18 2025

## 2018-06-16 ENCOUNTER — Emergency Department (HOSPITAL_COMMUNITY)
Admission: EM | Admit: 2018-06-16 | Discharge: 2018-06-16 | Disposition: A | Discharge status: Home / Self Care | Payer: Medicare HMO | Attending: Emergency Medicine | Admitting: Emergency Medicine

## 2018-06-16 ENCOUNTER — Other Ambulatory Visit: Payer: Self-pay

## 2018-06-16 ENCOUNTER — Emergency Department (HOSPITAL_COMMUNITY): Payer: Medicare HMO

## 2018-06-16 DIAGNOSIS — I5032 Chronic diastolic (congestive) heart failure: Secondary | ICD-10-CM | POA: Diagnosis not present

## 2018-06-16 DIAGNOSIS — Z8673 Personal history of transient ischemic attack (TIA), and cerebral infarction without residual deficits: Secondary | ICD-10-CM | POA: Diagnosis not present

## 2018-06-16 DIAGNOSIS — M25551 Pain in right hip: Secondary | ICD-10-CM

## 2018-06-16 DIAGNOSIS — Z7984 Long term (current) use of oral hypoglycemic drugs: Secondary | ICD-10-CM | POA: Insufficient documentation

## 2018-06-16 DIAGNOSIS — Z79899 Other long term (current) drug therapy: Secondary | ICD-10-CM | POA: Insufficient documentation

## 2018-06-16 DIAGNOSIS — Z7982 Long term (current) use of aspirin: Secondary | ICD-10-CM | POA: Diagnosis not present

## 2018-06-16 DIAGNOSIS — R519 Headache, unspecified: Secondary | ICD-10-CM

## 2018-06-16 DIAGNOSIS — E119 Type 2 diabetes mellitus without complications: Secondary | ICD-10-CM | POA: Diagnosis not present

## 2018-06-16 DIAGNOSIS — I110 Hypertensive heart disease with heart failure: Secondary | ICD-10-CM | POA: Insufficient documentation

## 2018-06-16 DIAGNOSIS — R51 Headache: Secondary | ICD-10-CM | POA: Insufficient documentation

## 2018-06-16 DIAGNOSIS — F17210 Nicotine dependence, cigarettes, uncomplicated: Secondary | ICD-10-CM | POA: Insufficient documentation

## 2018-06-16 DIAGNOSIS — I10 Essential (primary) hypertension: Secondary | ICD-10-CM | POA: Insufficient documentation

## 2018-06-16 MED ORDER — IBUPROFEN 800 MG PO TABS: 400.0000 mg | ORAL_TABLET | Freq: Four times a day (QID) | ORAL | 0 refills | Status: DC | PRN

## 2018-06-16 MED ORDER — ACETAMINOPHEN 500 MG PO TABS: 500.0000 mg | ORAL_TABLET | Freq: Four times a day (QID) | ORAL | 0 refills | Status: DC | PRN

## 2018-06-16 NOTE — ED Provider Notes (Signed)
[facility] EMERGENCY DEPARTMENT Provider Note   CSN: [id number] Arrival date & time: 06/16/18  2041     History   Chief Complaint Chief Complaint  Patient presents with  . Hip Pain    HPI DETRAVION TESTER is a 67 y.o. male with history of alcohol dependence, CHF, chronic back pain, CVA, hypertension, narcotic abuse, and pituitary adenoma presents for evaluation of acute onset, constant right hip pain for 3 days as well as gradual onset, constant frontal headache since this morning.  He notes pain in the right hip is constant, sharp, primarily lateral and radiates down the anterior aspect of the right thigh, does not past the knee.  Worsens with ambulation and palpation, no alleviating factors noted.  He has not tried anything for his symptoms.  Denies any known trauma, bending, lifting, or twisting injuries.  No numbness or tingling.  Denies back pain, bowel or bladder incontinence, or saddle anesthesia.  He ambulates with a walker at baseline.  This morning when his home health nurse came to visit him she noted that his diastolic blood pressure was elevated.  He is unsure of what his blood pressure was at that time.  He notes that he has had a mild throbbing frontal headache today which came on gradually.  No aggravating or alleviating factors noted.  States this headache feels similar to headaches he has had in the past/his usual headache.  No trauma or falls.  He denies fever, neck pain, vision changes, nausea, vomiting, photophobia, photophobia, slurred speech, or facial droop.  He has not tried anything for his symptoms.  He notes he is only had 2 doses of his hydralazine today and he typically takes 3, has been compliant with his other medications.  The history is provided by the patient.    Past Medical History:  Diagnosis Date  . Alcohol dependence ([place]) 12/10/2013  . Angioedema of lips 12/24/2013  . Brain tumor ([place])   . CHF (congestive heart failure) (HCC)    diastolic  . Chronic back pain   . Chronic headache   . CVA (cerebral vascular accident) ([place])   . Diabetes mellitus   . HTN (hypertension), malignant 09/06/2012  . Hypertension   . Narcotic abuse ([place])   . Normal cardiac stress test 2009  . TIA (transient ischemic attack) 09/06/2012  . Tobacco use disorder 12/10/2013  . Vertigo 09/06/2012    Patient Active Problem List   Diagnosis Date Noted  . Syncope 01/23/2017  . Elevated troponin   . Nausea 11/01/2016  . Dysuria 11/01/2016  . Chronic diastolic heart failure ([place]) 03/20/2015  . Chest pain 03/18/2015  . History of TIA (transient ischemic attack) 01/27/2015  . Enterococcus UTI 01/27/2015  . Cough 01/27/2015  . Bronchitis 01/27/2015  . HCAP (healthcare-associated pneumonia)   . CHF (congestive heart failure) ([place]) 01/26/2015  . History of alcohol abuse 01/26/2015  . Pyelonephritis 01/12/2015  . Prostatitis 01/12/2015  . Essential hypertension 11/03/2014  . Hypokalemia 11/03/2014  . Fever 08/15/2014  . Chronic pain 12/24/2013  . Tobacco use disorder 12/10/2013  . Leukocytosis 12/10/2013  . Diabetes mellitus type 2, controlled ([place]) 09/06/2012  . Pain in the side 09/06/2012  . Pituitary adenoma ([place]) 09/06/2012    Past Surgical History:  Procedure Laterality Date  . NO PAST SURGERIES          Home Medications    Prior to Admission medications   Medication Sig Start Date End Date Taking? Authorizing Provider  acetaminophen (  TYLENOL) 500 MG tablet Take 1 tablet (500 mg total) by mouth every 6 (six) hours as needed for mild pain. 06/16/18   Shoshanah Dapper A, PA-C  albuterol (PROVENTIL HFA;VENTOLIN HFA) 108 (90 Base) MCG/ACT inhaler Inhale 1-2 puffs into the lungs every 6 (six) hours as needed for wheezing. 09/04/16   Tanna Furry, MD  amLODipine (NORVASC) 10 MG tablet Take 10 mg by mouth daily.    [provider]  aspirin EC 81 MG tablet Take 81 mg by mouth daily.    [provider]  b complex vitamins  tablet Take 1 tablet by mouth daily.    [provider]  cephALEXin (KEFLEX) 500 MG capsule Take 1 capsule (500 mg total) by mouth 2 (two) times daily. Patient not taking: Reported on 01/02/2018 12/29/17   Montine Circle, PA-C  cyclobenzaprine (FLEXERIL) 10 MG tablet Take 1 tablet (10 mg total) by mouth 3 (three) times daily as needed for muscle spasms. 05/21/16   Lawyer, Harrell Gave, PA-C  docusate sodium (COLACE) 100 MG capsule Take 1 capsule (100 mg total) by mouth every 12 (twelve) hours. 01/02/18   Little, Wenda Overland, MD  doxycycline (VIBRAMYCIN) 100 MG capsule Take 1 capsule (100 mg total) by mouth 2 (two) times daily. Patient not taking: Reported on 01/02/2018 12/29/17   Montine Circle, PA-C  gabapentin (NEURONTIN) 100 MG capsule Take 100 mg by mouth 3 (three) times daily. 06/10/16   [provider]  hydrALAZINE (APRESOLINE) 100 MG tablet Take 1 tablet (100 mg total) by mouth every 8 (eight) hours. Patient not taking: Reported on 12/11/2017 01/30/15   Reyne Dumas, MD  hydrALAZINE (APRESOLINE) 50 MG tablet Take 50 mg by mouth 3 (three) times daily. 12/09/17   [provider]  hydrochlorothiazide (MICROZIDE) 12.5 MG capsule Take 12.5 mg by mouth daily. 12/09/17   [provider]  HYDROcodone-acetaminophen (NORCO) 5-325 MG tablet Take 1-2 tablets by mouth every 6 (six) hours as needed. 12/27/17   Veryl Speak, MD  hydrocortisone (ANUSOL-HC) 25 MG suppository Place 1 suppository (25 mg total) rectally 2 (two) times daily. Patient taking differently: Place 25 mg rectally 2 (two) times daily as needed for hemorrhoids.  09/04/16   Tanna Furry, MD  ibuprofen (ADVIL,MOTRIN) 800 MG tablet Take 0.5 tablets (400 mg total) by mouth every 6 (six) hours as needed (pain). 06/16/18   Freddi Forster A, PA-C  isosorbide dinitrate (ISORDIL) 10 MG tablet Take 1 tablet (10 mg total) by mouth 3 (three) times daily. 12/27/13   Debbe Odea, MD  metFORMIN (GLUCOPHAGE) 500 MG tablet Take 500  mg by mouth 2 (two) times daily with a meal.    [provider]  naproxen (NAPROSYN) 375 MG tablet Take 1 tablet (375 mg total) by mouth 2 (two) times daily. Patient taking differently: Take 375 mg by mouth 2 (two) times daily as needed for mild pain.  01/03/16   [name], MD  ondansetron (ZOFRAN) 4 MG tablet Take 1 tablet (4 mg total) by mouth every 6 (six) hours. Patient taking differently: Take 4 mg by mouth every 8 (eight) hours as needed for nausea.  03/20/16   Montine Circle, PA-C  oxyCODONE-acetaminophen (PERCOCET/ROXICET) 5-325 MG tablet Take 1 tablet by mouth every 8 (eight) hours as needed for moderate pain.  06/10/16   [provider]  pantoprazole (PROTONIX) 20 MG tablet Take 1 tablet (20 mg total) by mouth daily. 07/02/15   [name], MD  phenazopyridine (PYRIDIUM) 100 MG tablet Take 1 tablet (100 mg total)  by mouth 3 (three) times daily. 12/07/17   Larene Pickett, PA-C  polyethylene glycol powder (GLYCOLAX/MIRALAX) powder Take 17 g by mouth 2 (two) times daily. Until daily soft stools  OTC 01/02/18   Little, Wenda Overland, MD  pravastatin (PRAVACHOL) 20 MG tablet Take 20 mg by mouth at bedtime.    [provider]  tamsulosin (FLOMAX) 0.4 MG CAPS capsule Take 1 capsule (0.4 mg total) by mouth daily. 07/18/16   Barrett, Lahoma Crocker, PA-C    Family History Family History  Problem Relation Age of Onset  . CAD Father        "heart attack in his 16's"  . Hypertension Father   . Diabetes Brother   . Diabetes Sister   . Hypertension Mother     Social History Social History   Tobacco Use  . Smoking status: Current Every Day Smoker    Packs/day: 0.50    Types: Cigarettes  . Smokeless tobacco: Current User  Substance Use Topics  . Alcohol use: Yes  . Drug use: Yes    Types: Marijuana, Cocaine, IV    Comment: Heroin - one month ago     Allergies   Patient has no known allergies.   Review of Systems Review of Systems  Constitutional:  Negative for chills and fever.  Eyes: Negative for photophobia and visual disturbance.  Respiratory: Negative for shortness of breath.   Cardiovascular: Negative for chest pain.  Gastrointestinal: Negative for nausea and vomiting.  Musculoskeletal: Positive for arthralgias.  Neurological: Positive for headaches. Negative for syncope, weakness and numbness.  All other systems reviewed and are negative.    Physical Exam Updated Vital Signs BP (!) 166/58 (BP Location: Right Arm)   Pulse 83   Temp 98.2 F (36.8 C) (Oral)   Resp 18   Ht 5\' 9"  (1.753 m)   Wt 74.8 kg (165 lb)   SpO2 98%   BMI 24.37 kg/m   Physical Exam  Constitutional: He is oriented to person, place, and time. He appears well-developed and well-nourished. No distress.  HENT:  Head: Normocephalic and atraumatic.  No Battle's signs, no raccoon's eyes, no rhinorrhea.  No tenderness to palpation of the face or skull. No deformity, crepitus, or swelling noted.   Eyes: Pupils are equal, round, and reactive to light. Conjunctivae and EOM are normal. Right eye exhibits no discharge. Left eye exhibits no discharge.  Neck: Normal range of motion. Neck supple. No JVD present. No tracheal deviation present.  Cardiovascular: Normal rate and intact distal pulses.  2+ DP/PT pulses bilaterally, no lower extremity edema.  Pulmonary/Chest: Effort normal.  Abdominal: He exhibits no distension.  Musculoskeletal: Normal range of motion. He exhibits tenderness. He exhibits no edema.  Normal active range of motion of the right hip, pain elicited with flexion, internal rotation, and external rotation actively and passively.  No erythema, ecchymosis, deformity, or crepitus noted.  5/5 strength of BUE and BLE major muscle groups.  There is tenderness to palpation along the lateral aspect of the right hip.  Neurological: He is alert and oriented to person, place, and time. No cranial nerve deficit or sensory deficit. He exhibits normal muscle  tone.  Mental Status:  Alert, thought content appropriate, able to give a coherent history. Speech fluent without evidence of aphasia. Able to follow 2 step commands without difficulty.  Cranial Nerves:  II:  Peripheral visual fields grossly normal, pupils equal, round, reactive to light III,IV, VI: ptosis not present, extra-ocular motions intact bilaterally  V,VII:  smile symmetric, facial light touch sensation equal VIII: hearing grossly normal to voice  X: uvula elevates symmetrically  XI: bilateral shoulder shrug symmetric and strong XII: midline tongue extension without fassiculations Motor:  Normal tone. 5/5 strength of BUE and BLE major muscle groups including strong and equal grip strength and dorsiflexion/plantar flexion Sensory: light touch normal in all extremities. Cerebellar: normal finger-to-nose with bilateral upper extremities Gait: Mildly antalgic gait with a limp favoring the left side, exhibits good balance. Able to walk on toes and heels with ease.  No pronator drift, no nystagmus.   Skin: Skin is warm and dry. No erythema.  Psychiatric: He has a normal mood and affect. His behavior is normal.  Nursing note and vitals reviewed.    ED Treatments / Results  Labs (all labs ordered are listed, but only abnormal results are displayed) Labs Reviewed - No data to display  EKG None  Radiology Dg Hip Unilat  With Pelvis 2-3 Views Right  Result Date: 06/16/2018 CLINICAL DATA:  Right hip pain x3 days. EXAM: DG HIP (WITH OR WITHOUT PELVIS) 2-3V RIGHT COMPARISON:  None. FINDINGS: There is no evidence of hip fracture or dislocation. Gluteal calcific tendinopathy is noted with soft tissue calcification seen adjacent to the right greater trochanter. Aorta bi-iliac and branch vessel atherosclerosis. Mild lower lumbar facet arthropathy. IMPRESSION: Calcification adjacent to the right greater trochanter consistent with calcific gluteal tendinopathy. No acute osseous abnormality of  the pelvis and either hip. Electronically Signed   By: Ashley Royalty M.D.   On: 06/16/2018 21:17    Procedures Procedures (including critical care time)  Medications Ordered in ED Medications - No data to display   Initial Impression / Assessment and Plan / ED Course  I have reviewed the triage vital signs and the nursing notes.  Pertinent labs & imaging results that were available during my care of the patient were reviewed by me and considered in my medical decision making (see chart for details).     Patient presents with complaint of atraumatic hip pain for 3 days and gradual onset of frontal headache today.  States headache is similar to his usual headaches, no fevers.  No focal neurologic deficits.  He is afebrile, initially hypertensive with some improvement on reevaluation.  Has not had his third dose of hydralazine so far today which he states he is due for.  No chest pain, shortness of breath, or decreased urine output.  Doubt CVA, ICH, SAH, meningitis, or other acute intracranial abnormality.  He is neurovascularly intact, ambulatory with a limp but exhibits good balance.  No evidence of septic arthritis.  Compartments are soft.  I doubt DVT.  Radiographs show calcification adjacent to the right greater trochanter consistent with a calcific gluteal tendinopathy with no other acute osseous abnormalities.  He is focally tender overlying this area which corresponds to the radiographs and I suspect this is his source of pain.  Recommend small doses of NSAIDs, Tylenol, heat therapy, and follow-up with his PCP for reevaluation of his hypertension and his hip pain.  Discussed strict ED return precautions. Pt verbalized understanding of and agreement with plan and is safe for discharge home at this time.   Final Clinical Impressions(s) / ED Diagnoses   Final diagnoses:  Right hip pain  Frontal headache  Hypertension, unspecified type    ED Discharge Orders        Ordered     acetaminophen (TYLENOL) 500 MG tablet  Every 6 hours PRN  06/16/18 2218    ibuprofen (ADVIL,MOTRIN) 800 MG tablet  Every 6 hours PRN     06/16/18 2218       Renita Papa, PA-C 06/17/18 1501    Davonna Belling, MD 06/18/18 1511

## 2018-06-16 NOTE — ED Notes (Signed)
Pt provided with graham crackers and ginger ale.

## 2018-06-16 NOTE — ED Notes (Signed)
Patient able to ambulate independently  

## 2018-06-16 NOTE — Discharge Instructions (Signed)
You may alternate 400 mg of ibuprofen and 500 mg of Tylenol every 3 hours as needed for pain.  Take ibuprofen with food.  Do not exceed 4000 mg of Tylenol daily.  These medications will help with both her hip pain and your headache.  Apply heat or ice, 20 minutes on 20 minutes off to be affected area.  Do some gentle stretching to avoid muscle stiffness.  Continue to use your cane or walker to help you walk.  Continue taking your home medicines as prescribed.  Follow-up with your primary care physician or an orthopedist in the next 3 to 5 days for reevaluation of your symptoms.  Return to the emergency department immediately if any concerning signs or symptoms develop such as fever, redness, weakness, slurred speech, or passing out.

## 2018-06-16 NOTE — ED Notes (Signed)
PA at bedside.

## 2018-06-16 NOTE — ED Triage Notes (Signed)
Patient c/o right hip pain that started 2 days ago. Also c/o elevated BP - states that his head and eyes hurt. States he wants to get checked out.

## 2018-06-16 NOTE — ED Notes (Signed)
Patient able to ambulate with no difficulty from wheelchair to recliner.

## 2018-11-21 ENCOUNTER — Emergency Department (HOSPITAL_COMMUNITY)
Admission: EM | Admit: 2018-11-21 | Discharge: 2018-11-21 | Disposition: A | Discharge status: Home / Self Care | Payer: Medicare HMO | Attending: Emergency Medicine | Admitting: Emergency Medicine

## 2018-11-21 ENCOUNTER — Emergency Department (HOSPITAL_COMMUNITY): Payer: Medicare HMO

## 2018-11-21 ENCOUNTER — Encounter (HOSPITAL_COMMUNITY): Payer: Self-pay

## 2018-11-21 ENCOUNTER — Other Ambulatory Visit: Payer: Self-pay

## 2018-11-21 DIAGNOSIS — I5032 Chronic diastolic (congestive) heart failure: Secondary | ICD-10-CM | POA: Diagnosis not present

## 2018-11-21 DIAGNOSIS — Z8673 Personal history of transient ischemic attack (TIA), and cerebral infarction without residual deficits: Secondary | ICD-10-CM | POA: Diagnosis not present

## 2018-11-21 DIAGNOSIS — Z7984 Long term (current) use of oral hypoglycemic drugs: Secondary | ICD-10-CM | POA: Diagnosis not present

## 2018-11-21 DIAGNOSIS — N390 Urinary tract infection, site not specified: Secondary | ICD-10-CM

## 2018-11-21 DIAGNOSIS — R300 Dysuria: Secondary | ICD-10-CM | POA: Diagnosis present

## 2018-11-21 DIAGNOSIS — Z7982 Long term (current) use of aspirin: Secondary | ICD-10-CM | POA: Diagnosis not present

## 2018-11-21 DIAGNOSIS — Z79899 Other long term (current) drug therapy: Secondary | ICD-10-CM | POA: Diagnosis not present

## 2018-11-21 DIAGNOSIS — E119 Type 2 diabetes mellitus without complications: Secondary | ICD-10-CM | POA: Diagnosis not present

## 2018-11-21 DIAGNOSIS — F17210 Nicotine dependence, cigarettes, uncomplicated: Secondary | ICD-10-CM | POA: Diagnosis not present

## 2018-11-21 DIAGNOSIS — I110 Hypertensive heart disease with heart failure: Secondary | ICD-10-CM | POA: Insufficient documentation

## 2018-11-21 LAB — COMPREHENSIVE METABOLIC PANEL
ALT: 45 U/L — ABNORMAL HIGH (ref 0–44)
AST: 50 U/L — AB (ref 15–41)
Albumin: 3.7 g/dL (ref 3.5–5.0)
Alkaline Phosphatase: 59 U/L (ref 38–126)
Anion gap: 8 (ref 5–15)
BUN: 30 mg/dL — AB (ref 8–23)
CHLORIDE: 100 mmol/L (ref 98–111)
CO2: 20 mmol/L — AB (ref 22–32)
CREATININE: 1.08 mg/dL (ref 0.61–1.24)
Calcium: 9.1 mg/dL (ref 8.9–10.3)
Glucose, Bld: 201 mg/dL — ABNORMAL HIGH (ref 70–99)
POTASSIUM: 4.3 mmol/L (ref 3.5–5.1)
SODIUM: 128 mmol/L — AB (ref 135–145)
Total Bilirubin: 0.6 mg/dL (ref 0.3–1.2)
Total Protein: 7.4 g/dL (ref 6.5–8.1)

## 2018-11-21 LAB — LIPASE, BLOOD: Lipase: 32 U/L (ref 11–51)

## 2018-11-21 LAB — URINALYSIS, ROUTINE W REFLEX MICROSCOPIC
BILIRUBIN URINE: NEGATIVE
Glucose, UA: NEGATIVE mg/dL
KETONES UR: NEGATIVE mg/dL
Nitrite: POSITIVE — AB
Protein, ur: 100 mg/dL — AB
Specific Gravity, Urine: 1.014 (ref 1.005–1.030)
pH: 8 (ref 5.0–8.0)

## 2018-11-21 LAB — CBC WITH DIFFERENTIAL/PLATELET
Abs Immature Granulocytes: 0.05 10*3/uL (ref 0.00–0.07)
BASOS PCT: 1 %
Basophils Absolute: 0.1 10*3/uL (ref 0.0–0.1)
EOS ABS: 0.6 10*3/uL — AB (ref 0.0–0.5)
Eosinophils Relative: 5 %
HCT: 44.2 % (ref 39.0–52.0)
Hemoglobin: 12.8 g/dL — ABNORMAL LOW (ref 13.0–17.0)
Immature Granulocytes: 0 %
Lymphocytes Relative: 28 %
Lymphs Abs: 3.5 10*3/uL (ref 0.7–4.0)
MCH: 21.9 pg — ABNORMAL LOW (ref 26.0–34.0)
MCHC: 29 g/dL — ABNORMAL LOW (ref 30.0–36.0)
MCV: 75.7 fL — AB (ref 80.0–100.0)
Monocytes Absolute: 1.2 10*3/uL — ABNORMAL HIGH (ref 0.1–1.0)
Monocytes Relative: 10 %
NEUTROS ABS: 6.8 10*3/uL (ref 1.7–7.7)
NRBC: 0 % (ref 0.0–0.2)
Neutrophils Relative %: 56 %
PLATELETS: 279 10*3/uL (ref 150–400)
RBC: 5.84 MIL/uL — AB (ref 4.22–5.81)
RDW: 14.6 % (ref 11.5–15.5)
WBC: 12.2 10*3/uL — AB (ref 4.0–10.5)

## 2018-11-21 MED ORDER — ONDANSETRON HCL 4 MG/2ML IJ SOLN
4.0000 mg | Freq: Once | INTRAMUSCULAR | Status: AC
  Administered 2018-11-21: 4 mg via INTRAVENOUS
  Filled 2018-11-21: qty 2

## 2018-11-21 MED ORDER — SODIUM CHLORIDE 0.9 % IV BOLUS
500.0000 mL | Freq: Once | INTRAVENOUS | Status: AC
  Administered 2018-11-21: 500 mL via INTRAVENOUS

## 2018-11-21 MED ORDER — SODIUM CHLORIDE 0.9 % IV SOLN
1.0000 g | Freq: Once | INTRAVENOUS | Status: AC
  Administered 2018-11-21: 1 g via INTRAVENOUS
  Filled 2018-11-21: qty 10

## 2018-11-21 MED ORDER — SULFAMETHOXAZOLE-TRIMETHOPRIM 800-160 MG PO TABS: 1.0000 | ORAL_TABLET | Freq: Two times a day (BID) | ORAL | 0 refills | Status: AC

## 2018-11-21 NOTE — ED Notes (Signed)
Gave patient peanut butter and graham crackers with milk.

## 2018-11-21 NOTE — ED Notes (Signed)
Patient Alert and oriented to baseline. Stable and ambulatory to baseline. Patient verbalized understanding of the discharge instructions.  Patient belongings were taken by the patient.   

## 2018-11-21 NOTE — ED Triage Notes (Signed)
Pt here urinary frequency and nausea for the last 3-4 hours.  No active vomiting. Pt denies any bleeding with urination.

## 2018-11-21 NOTE — ED Provider Notes (Signed)
[facility] EMERGENCY DEPARTMENT Provider Note   CSN: [id number] Arrival date & time: 11/21/18  1935     History   Chief Complaint Chief Complaint  Patient presents with  . Recurrent UTI  . Nausea    HPI [name] is a 67 y.o. male.  Patient is a 67 y.o. Male with PMHx of recurrent UTIs, HTN, CHF presenting for dysuria and fatigue. Patient states that he is complaining of dysuria and increased frequency of urination. Patient states that he has been laying around all day, nausea without vomiting. Patient states that he has had no fever or chills, endorses poor PO intake.   The history is provided by the patient. No language interpreter was used.    Past Medical History:  Diagnosis Date  . Alcohol dependence ([place]) 12/10/2013  . Angioedema of lips 12/24/2013  . Brain tumor ([place])   . CHF (congestive heart failure) ([facility])    diastolic  . Chronic back pain   . Chronic headache   . CVA (cerebral vascular accident) ([place])   . Diabetes mellitus   . HTN (hypertension), malignant 09/06/2012  . Hypertension   . Narcotic abuse ([place])   . Normal cardiac stress test 2009  . TIA (transient ischemic attack) 09/06/2012  . Tobacco use disorder 12/10/2013  . Vertigo 09/06/2012    Patient Active Problem List   Diagnosis Date Noted  . Syncope 01/23/2017  . Elevated troponin   . Nausea 11/01/2016  . Dysuria 11/01/2016  . Chronic diastolic heart failure ([place]) 03/20/2015  . Chest pain 03/18/2015  . History of TIA (transient ischemic attack) 01/27/2015  . Enterococcus UTI 01/27/2015  . Cough 01/27/2015  . Bronchitis 01/27/2015  . HCAP (healthcare-associated pneumonia)   . CHF (congestive heart failure) ([place]) 01/26/2015  . History of alcohol abuse 01/26/2015  . Pyelonephritis 01/12/2015  . Prostatitis 01/12/2015  . Essential hypertension 11/03/2014  . Hypokalemia 11/03/2014  . Fever 08/15/2014  . Chronic pain 12/24/2013  . Tobacco use disorder 12/10/2013  .  Leukocytosis 12/10/2013  . Diabetes mellitus type 2, controlled ([place]) 09/06/2012  . Pain in the side 09/06/2012  . Pituitary adenoma ([place]) 09/06/2012    Past Surgical History:  Procedure Laterality Date  . NO PAST SURGERIES          Home Medications    Prior to Admission medications   Medication Sig Start Date End Date Taking? Authorizing Provider  acetaminophen (TYLENOL) 500 MG tablet Take 1 tablet (500 mg total) by mouth every 6 (six) hours as needed for mild pain. 06/16/18  Yes Fawze, Mina A, PA-C  albuterol (PROVENTIL HFA;VENTOLIN HFA) 108 (90 Base) MCG/ACT inhaler Inhale 1-2 puffs into the lungs every 6 (six) hours as needed for wheezing. 09/04/16  Yes Tanna Furry, MD  amLODipine (NORVASC) 10 MG tablet Take 10 mg by mouth daily.   Yes [provider]  aspirin EC 81 MG tablet Take 81 mg by mouth daily.   Yes [provider]  cyclobenzaprine (FLEXERIL) 10 MG tablet Take 1 tablet (10 mg total) by mouth 3 (three) times daily as needed for muscle spasms. 05/21/16  Yes Lawyer, Harrell Gave, PA-C  docusate sodium (COLACE) 100 MG capsule Take 1 capsule (100 mg total) by mouth every 12 (twelve) hours. Patient taking differently: Take 100 mg by mouth daily.  01/02/18  Yes Little, Wenda Overland, MD  gabapentin (NEURONTIN) 100 MG capsule Take 100 mg by mouth 3 (three) times daily. 06/10/16  Yes [provider]  hydrALAZINE (  APRESOLINE) 50 MG tablet Take 50 mg by mouth 3 (three) times daily. 12/09/17  Yes [provider]  hydrochlorothiazide (MICROZIDE) 12.5 MG capsule Take 12.5 mg by mouth daily. 12/09/17  Yes [provider]  HYDROcodone-acetaminophen (NORCO) 5-325 MG tablet Take 1-2 tablets by mouth every 6 (six) hours as needed. Patient taking differently: Take 1-2 tablets by mouth every 6 (six) hours as needed for moderate pain.  12/27/17  Yes Delo, Nathaneil Canary, MD  hydrocortisone (ANUSOL-HC) 25 MG suppository Place 1 suppository (25 mg total) rectally 2  (two) times daily. Patient taking differently: Place 25 mg rectally 2 (two) times daily as needed for hemorrhoids.  09/04/16  Yes Tanna Furry, MD  ibuprofen (ADVIL,MOTRIN) 800 MG tablet Take 0.5 tablets (400 mg total) by mouth every 6 (six) hours as needed (pain). 06/16/18  Yes Fawze, Mina A, PA-C  isosorbide dinitrate (ISORDIL) 10 MG tablet Take 1 tablet (10 mg total) by mouth 3 (three) times daily. 12/27/13  Yes Debbe Odea, MD  metFORMIN (GLUCOPHAGE) 500 MG tablet Take 500 mg by mouth 2 (two) times daily with a meal.   Yes [provider]  naproxen (NAPROSYN) 375 MG tablet Take 1 tablet (375 mg total) by mouth 2 (two) times daily. Patient taking differently: Take 375 mg by mouth 2 (two) times daily as needed for mild pain.  01/03/16  Yes Nanavati, Ankit, MD  ondansetron (ZOFRAN) 4 MG tablet Take 1 tablet (4 mg total) by mouth every 6 (six) hours. Patient taking differently: Take 4 mg by mouth every 8 (eight) hours as needed for nausea.  03/20/16  Yes Montine Circle, PA-C  oxyCODONE-acetaminophen (PERCOCET/ROXICET) 5-325 MG tablet Take 1 tablet by mouth every 8 (eight) hours as needed for moderate pain.  06/10/16  Yes [provider]  pantoprazole (PROTONIX) 20 MG tablet Take 1 tablet (20 mg total) by mouth daily. 07/02/15  Yes [name], MD  phenazopyridine (PYRIDIUM) 100 MG tablet Take 1 tablet (100 mg total) by mouth 3 (three) times daily. 12/07/17  Yes Larene Pickett, PA-C  polyethylene glycol powder (GLYCOLAX/MIRALAX) powder Take 17 g by mouth 2 (two) times daily. Until daily soft stools  OTC 01/02/18  Yes Little, Wenda Overland, MD  pravastatin (PRAVACHOL) 20 MG tablet Take 20 mg by mouth at bedtime.   Yes [provider]  tamsulosin (FLOMAX) 0.4 MG CAPS capsule Take 1 capsule (0.4 mg total) by mouth daily. 07/18/16  Yes Barrett, Lahoma Crocker, PA-C  cephALEXin (KEFLEX) 500 MG capsule Take 1 capsule (500 mg total) by mouth 2 (two) times daily. Patient not taking: Reported  on 01/02/2018 12/29/17   Montine Circle, PA-C  doxycycline (VIBRAMYCIN) 100 MG capsule Take 1 capsule (100 mg total) by mouth 2 (two) times daily. Patient not taking: Reported on 01/02/2018 12/29/17   Montine Circle, PA-C  hydrALAZINE (APRESOLINE) 100 MG tablet Take 1 tablet (100 mg total) by mouth every 8 (eight) hours. Patient not taking: Reported on 12/11/2017 01/30/15   Reyne Dumas, MD  sulfamethoxazole-trimethoprim (BACTRIM DS,SEPTRA DS) 800-160 MG tablet Take 1 tablet by mouth 2 (two) times daily for 7 days. 11/21/18 11/28/18  [name] Squibb, MD    Family History Family History  Problem Relation Age of Onset  . CAD Father        "heart attack in his 40's"  . Hypertension Father   . Diabetes Brother   . Diabetes Sister   . Hypertension Mother     Social History Social History   Tobacco Use  .  Smoking status: Current Every Day Smoker    Packs/day: 0.50    Types: Cigarettes  . Smokeless tobacco: Current User  Substance Use Topics  . Alcohol use: Yes  . Drug use: Yes    Types: Marijuana, Cocaine, IV    Comment: Heroin - one month ago     Allergies   Patient has no known allergies.   Review of Systems Review of Systems  Constitutional: Positive for fatigue. Negative for chills and fever.  HENT: Negative for ear pain and sore throat.   Eyes: Negative for pain and visual disturbance.  Respiratory: Negative for cough and shortness of breath.   Cardiovascular: Negative for chest pain and palpitations.  Gastrointestinal: Positive for nausea. Negative for abdominal distention, abdominal pain, blood in stool, constipation, diarrhea and vomiting.  Genitourinary: Positive for dysuria and frequency. Negative for difficulty urinating and hematuria.  Musculoskeletal: Negative for arthralgias and back pain.  Skin: Negative for color change and rash.  Neurological: Positive for weakness. Negative for seizures and syncope.  All other systems reviewed and are negative.    Physical  Exam Updated Vital Signs BP (!) 168/66   Pulse 78   Temp 97.9 F (36.6 C) (Oral)   Resp 13   Ht 5\' 9"  (1.753 m)   Wt 72.6 kg   SpO2 99%   BMI 23.63 kg/m   Physical Exam  Constitutional: He appears well-developed and well-nourished.  HENT:  Head: Normocephalic and atraumatic.  Eyes: Conjunctivae are normal.  Neck: Neck supple.  Cardiovascular: Normal rate and regular rhythm.  No murmur heard. Pulmonary/Chest: Effort normal and breath sounds normal. No respiratory distress.  Abdominal: Soft. He exhibits no distension. There is tenderness in the suprapubic area and left upper quadrant. There is no rigidity, no rebound, no guarding and no CVA tenderness.  Musculoskeletal: He exhibits no edema.  Neurological: He is alert.  Skin: Skin is warm and dry.  Psychiatric: He has a normal mood and affect.  Nursing note and vitals reviewed.    ED Treatments / Results  Labs (all labs ordered are listed, but only abnormal results are displayed) Labs Reviewed  CBC WITH DIFFERENTIAL/PLATELET - Abnormal; Notable for the following components:      Result Value   WBC 12.2 (*)    RBC 5.84 (*)    Hemoglobin 12.8 (*)    MCV 75.7 (*)    MCH 21.9 (*)    MCHC 29.0 (*)    Monocytes Absolute 1.2 (*)    Eosinophils Absolute 0.6 (*)    All other components within normal limits  COMPREHENSIVE METABOLIC PANEL - Abnormal; Notable for the following components:   Sodium 128 (*)    CO2 20 (*)    Glucose, Bld 201 (*)    BUN 30 (*)    AST 50 (*)    ALT 45 (*)    All other components within normal limits  URINALYSIS, ROUTINE W REFLEX MICROSCOPIC - Abnormal; Notable for the following components:   Color, Urine AMBER (*)    APPearance CLOUDY (*)    Hgb urine dipstick LARGE (*)    Protein, ur 100 (*)    Nitrite POSITIVE (*)    Leukocytes, UA MODERATE (*)    RBC / HPF >50 (*)    WBC, UA >50 (*)    Bacteria, UA FEW (*)    All other components within normal limits  LIPASE, BLOOD     EKG None  Radiology US Renal  Result Date: 11/21/2018 CLINICAL DATA:  Polyuria and elevated BUN. EXAM: RENAL / URINARY TRACT ULTRASOUND COMPLETE COMPARISON:  None. FINDINGS: Right Kidney: Renal measurements: 10.0 x 5.1 x 5.2 cm = volume: 137.3 mL. There is a shadowing, echogenic focus of the upper pole measuring 5 mm. Echogenicity within normal limits. No mass or hydronephrosis visualized. Left Kidney: Renal measurements: 10.8 x 5.8 x 5.0 cm = volume: 160 mL. Echogenicity within normal limits. No mass or hydronephrosis visualized. Bladder: Diffuse bladder wall thickening with large right and smaller left diverticula. Both ureteral jets were visualized. IMPRESSION: 1. Diffuse bladder wall thickening with diverticula, as previously demonstrated by CT. 2. No hydronephrosis. 3. Right upper pole nonobstructive renal calculus measuring 5 mm. Electronically Signed   By: Ulyses Jarred M.D.   On: 11/21/2018 22:44    Procedures Procedures (including critical care time)  Medications Ordered in ED Medications  sodium chloride 0.9 % bolus 500 mL (0 mLs Intravenous Stopped 11/21/18 2114)  ondansetron (ZOFRAN) injection 4 mg (4 mg Intravenous Given 11/21/18 2013)  sodium chloride 0.9 % bolus 500 mL (0 mLs Intravenous Stopped 11/21/18 2146)  cefTRIAXone (ROCEPHIN) 1 g in sodium chloride 0.9 % 100 mL IVPB (0 g Intravenous Stopped 11/21/18 2145)     Initial Impression / Assessment and Plan / ED Course  I have reviewed the triage vital signs and the nursing notes.  Pertinent labs & imaging results that were available during my care of the patient were reviewed by me and considered in my medical decision making (see chart for details).     Patient is a 67 y.o. male with PMHx of recurrent UTIs, acute urinary retention presenting for dysuria and nausea that started today. Patient is tolerating PO but feels weak.  Labs remarkable for hyponatremia, normal creatinine and other electrolytes. Urine  concerning for UTI. Due to patient's recurrent UTI hx, [place] of kidneys were obtained that would evaluate if patient had obstruction or pyelonephritis.  US shows diffuse bladder thickening without hydronephrsis.  Patient given 1L NS bolus with IV rocephin for UTI. Patient will be given PO bactrim to start tomorrow. Patient tolerated PO while in the ED.  Patient safe for discharge, strict return precautions discussed. All questions answered. Patient counseled to follow up with PCP as soon as possible, urology consult is needed.   Final Clinical Impressions(s) / ED Diagnoses   Final diagnoses:  Lower urinary tract infectious disease    ED Discharge Orders         Ordered    sulfamethoxazole-trimethoprim (BACTRIM DS,SEPTRA DS) 800-160 MG tablet  2 times daily     11/21/18 2256           [name] Squibb, MD 11/21/18 2311    Dorie Rank, MD 11/21/18 2318

## 2019-01-09 ENCOUNTER — Emergency Department (HOSPITAL_COMMUNITY)
Admission: EM | Admit: 2019-01-09 | Discharge: 2019-01-09 | Disposition: A | Discharge status: Home / Self Care | Payer: Medicare HMO | Attending: Emergency Medicine | Admitting: Emergency Medicine

## 2019-01-09 ENCOUNTER — Other Ambulatory Visit: Payer: Self-pay

## 2019-01-09 ENCOUNTER — Encounter (HOSPITAL_COMMUNITY): Payer: Self-pay | Admitting: *Deleted

## 2019-01-09 DIAGNOSIS — Z79899 Other long term (current) drug therapy: Secondary | ICD-10-CM | POA: Insufficient documentation

## 2019-01-09 DIAGNOSIS — N390 Urinary tract infection, site not specified: Secondary | ICD-10-CM

## 2019-01-09 DIAGNOSIS — I110 Hypertensive heart disease with heart failure: Secondary | ICD-10-CM | POA: Insufficient documentation

## 2019-01-09 DIAGNOSIS — R350 Frequency of micturition: Secondary | ICD-10-CM | POA: Diagnosis present

## 2019-01-09 DIAGNOSIS — I5032 Chronic diastolic (congestive) heart failure: Secondary | ICD-10-CM | POA: Diagnosis not present

## 2019-01-09 DIAGNOSIS — E119 Type 2 diabetes mellitus without complications: Secondary | ICD-10-CM | POA: Diagnosis not present

## 2019-01-09 DIAGNOSIS — F17210 Nicotine dependence, cigarettes, uncomplicated: Secondary | ICD-10-CM | POA: Diagnosis not present

## 2019-01-09 DIAGNOSIS — Z8673 Personal history of transient ischemic attack (TIA), and cerebral infarction without residual deficits: Secondary | ICD-10-CM | POA: Insufficient documentation

## 2019-01-09 DIAGNOSIS — Z7984 Long term (current) use of oral hypoglycemic drugs: Secondary | ICD-10-CM | POA: Diagnosis not present

## 2019-01-09 LAB — CBC WITH DIFFERENTIAL/PLATELET
ABS IMMATURE GRANULOCYTES: 0.07 10*3/uL (ref 0.00–0.07)
Basophils Absolute: 0.1 10*3/uL (ref 0.0–0.1)
Basophils Relative: 1 %
Eosinophils Absolute: 0.3 10*3/uL (ref 0.0–0.5)
Eosinophils Relative: 2 %
HCT: 45.5 % (ref 39.0–52.0)
HEMOGLOBIN: 13.8 g/dL (ref 13.0–17.0)
IMMATURE GRANULOCYTES: 1 %
LYMPHS ABS: 3 10*3/uL (ref 0.7–4.0)
LYMPHS PCT: 27 %
MCH: 22.8 pg — ABNORMAL LOW (ref 26.0–34.0)
MCHC: 30.3 g/dL (ref 30.0–36.0)
MCV: 75.1 fL — ABNORMAL LOW (ref 80.0–100.0)
Monocytes Absolute: 0.7 10*3/uL (ref 0.1–1.0)
Monocytes Relative: 7 %
NEUTROS PCT: 62 %
NRBC: 0 % (ref 0.0–0.2)
Neutro Abs: 6.9 10*3/uL (ref 1.7–7.7)
Platelets: 305 10*3/uL (ref 150–400)
RBC: 6.06 MIL/uL — ABNORMAL HIGH (ref 4.22–5.81)
RDW: 15.1 % (ref 11.5–15.5)
WBC: 11 10*3/uL — ABNORMAL HIGH (ref 4.0–10.5)

## 2019-01-09 LAB — URINALYSIS, ROUTINE W REFLEX MICROSCOPIC
Bilirubin Urine: NEGATIVE
GLUCOSE, UA: 50 mg/dL — AB
Ketones, ur: NEGATIVE mg/dL
NITRITE: NEGATIVE
PH: 8 (ref 5.0–8.0)
Protein, ur: 100 mg/dL — AB
Specific Gravity, Urine: 1.014 (ref 1.005–1.030)
WBC, UA: >50 WBC/hpf — ABNORMAL HIGH (ref 0–5)

## 2019-01-09 LAB — COMPREHENSIVE METABOLIC PANEL
ALBUMIN: 4 g/dL (ref 3.5–5.0)
ALK PHOS: 68 U/L (ref 38–126)
ALT: 47 U/L — ABNORMAL HIGH (ref 0–44)
AST: 47 U/L — AB (ref 15–41)
Anion gap: 15 (ref 5–15)
BILIRUBIN TOTAL: 0.4 mg/dL (ref 0.3–1.2)
BUN: 29 mg/dL — AB (ref 8–23)
CALCIUM: 9.5 mg/dL (ref 8.9–10.3)
CO2: 18 mmol/L — AB (ref 22–32)
Chloride: 101 mmol/L (ref 98–111)
Creatinine, Ser: 1.5 mg/dL — ABNORMAL HIGH (ref 0.61–1.24)
GFR calc Af Amer: 55 mL/min — ABNORMAL LOW (ref >60)
GFR calc non Af Amer: 48 mL/min — ABNORMAL LOW (ref >60)
GLUCOSE: 217 mg/dL — AB (ref 70–99)
POTASSIUM: 4.1 mmol/L (ref 3.5–5.1)
SODIUM: 134 mmol/L — AB (ref 135–145)
TOTAL PROTEIN: 7.9 g/dL (ref 6.5–8.1)

## 2019-01-09 LAB — LIPASE, BLOOD: Lipase: 31 U/L (ref 11–51)

## 2019-01-09 LAB — I-STAT CG4 LACTIC ACID, ED: Lactic Acid, Venous: 2.31 mmol/L (ref 0.5–1.9)

## 2019-01-09 MED ORDER — ONDANSETRON HCL 4 MG/2ML IJ SOLN
4.0000 mg | Freq: Once | INTRAMUSCULAR | Status: AC
  Administered 2019-01-09: 4 mg via INTRAVENOUS
  Filled 2019-01-09: qty 2

## 2019-01-09 MED ORDER — SODIUM CHLORIDE 0.9 % IV BOLUS
1000.0000 mL | Freq: Once | INTRAVENOUS | Status: AC
  Administered 2019-01-09: 1000 mL via INTRAVENOUS

## 2019-01-09 MED ORDER — SULFAMETHOXAZOLE-TRIMETHOPRIM 800-160 MG PO TABS: 1.0000 | ORAL_TABLET | Freq: Two times a day (BID) | ORAL | 0 refills | Status: AC

## 2019-01-09 MED ORDER — SODIUM CHLORIDE 0.9 % IV SOLN
1.0000 g | Freq: Once | INTRAVENOUS | Status: AC
  Administered 2019-01-09: 1 g via INTRAVENOUS
  Filled 2019-01-09: qty 10

## 2019-01-09 NOTE — ED Provider Notes (Signed)
[facility] EMERGENCY DEPARTMENT Provider Note   CSN: [id number] Arrival date & time: 01/09/19  0254     History   Chief Complaint Chief Complaint  Patient presents with  . Urinary Frequency  . Abdominal Pain    HPI [name] is a 68 y.o. male.  Patient presents with concerns over likely urinary tract infection.  Patient reports that he has been experiencing urinary frequency and pain with urination for 2 days.  He has had nausea and vomiting.  Patient reports pain in the lower abdomen.  All of the symptoms are similar to what he has had in the past with urinary tract infection.     Past Medical History:  Diagnosis Date  . Alcohol dependence ([place]) 12/10/2013  . Angioedema of lips 12/24/2013  . Brain tumor ([place])   . CHF (congestive heart failure) (HCC)    diastolic  . Chronic back pain   . Chronic headache   . CVA (cerebral vascular accident) ([place])   . Diabetes mellitus   . HTN (hypertension), malignant 09/06/2012  . Hypertension   . Narcotic abuse ([place])   . Normal cardiac stress test 2009  . TIA (transient ischemic attack) 09/06/2012  . Tobacco use disorder 12/10/2013  . Vertigo 09/06/2012    Patient Active Problem List   Diagnosis Date Noted  . Syncope 01/23/2017  . Elevated troponin   . Nausea 11/01/2016  . Dysuria 11/01/2016  . Chronic diastolic heart failure ([place]) 03/20/2015  . Chest pain 03/18/2015  . History of TIA (transient ischemic attack) 01/27/2015  . Enterococcus UTI 01/27/2015  . Cough 01/27/2015  . Bronchitis 01/27/2015  . HCAP (healthcare-associated pneumonia)   . CHF (congestive heart failure) ([place]) 01/26/2015  . History of alcohol abuse 01/26/2015  . Pyelonephritis 01/12/2015  . Prostatitis 01/12/2015  . Essential hypertension 11/03/2014  . Hypokalemia 11/03/2014  . Fever 08/15/2014  . Chronic pain 12/24/2013  . Tobacco use disorder 12/10/2013  . Leukocytosis 12/10/2013  . Diabetes mellitus type 2, controlled ([place])  09/06/2012  . Pain in the side 09/06/2012  . Pituitary adenoma ([place]) 09/06/2012    Past Surgical History:  Procedure Laterality Date  . NO PAST SURGERIES          Home Medications    Prior to Admission medications   Medication Sig Start Date End Date Taking? Authorizing Provider  acetaminophen (TYLENOL) 500 MG tablet Take 1 tablet (500 mg total) by mouth every 6 (six) hours as needed for mild pain. 06/16/18   Fawze, Mina A, PA-C  albuterol (PROVENTIL HFA;VENTOLIN HFA) 108 (90 Base) MCG/ACT inhaler Inhale 1-2 puffs into the lungs every 6 (six) hours as needed for wheezing. 09/04/16   Tanna Furry, MD  amLODipine (NORVASC) 10 MG tablet Take 10 mg by mouth daily.    [provider]  aspirin EC 81 MG tablet Take 81 mg by mouth daily.    [provider]  cephALEXin (KEFLEX) 500 MG capsule Take 1 capsule (500 mg total) by mouth 2 (two) times daily. Patient not taking: Reported on 01/02/2018 12/29/17   Montine Circle, PA-C  cyclobenzaprine (FLEXERIL) 10 MG tablet Take 1 tablet (10 mg total) by mouth 3 (three) times daily as needed for muscle spasms. 05/21/16   Lawyer, Harrell Gave, PA-C  docusate sodium (COLACE) 100 MG capsule Take 1 capsule (100 mg total) by mouth every 12 (twelve) hours. Patient taking differently: Take 100 mg by mouth daily.  01/02/18   Little, Wenda Overland, MD  doxycycline (VIBRAMYCIN)  100 MG capsule Take 1 capsule (100 mg total) by mouth 2 (two) times daily. Patient not taking: Reported on 01/02/2018 12/29/17   Montine Circle, PA-C  gabapentin (NEURONTIN) 100 MG capsule Take 100 mg by mouth 3 (three) times daily. 06/10/16   [provider]  hydrALAZINE (APRESOLINE) 100 MG tablet Take 1 tablet (100 mg total) by mouth every 8 (eight) hours. Patient not taking: Reported on 12/11/2017 01/30/15   Reyne Dumas, MD  hydrALAZINE (APRESOLINE) 50 MG tablet Take 50 mg by mouth 3 (three) times daily. 12/09/17   [provider]  hydrochlorothiazide (MICROZIDE)  12.5 MG capsule Take 12.5 mg by mouth daily. 12/09/17   [provider]  HYDROcodone-acetaminophen (NORCO) 5-325 MG tablet Take 1-2 tablets by mouth every 6 (six) hours as needed. Patient taking differently: Take 1-2 tablets by mouth every 6 (six) hours as needed for moderate pain.  12/27/17   Veryl Speak, MD  hydrocortisone (ANUSOL-HC) 25 MG suppository Place 1 suppository (25 mg total) rectally 2 (two) times daily. Patient taking differently: Place 25 mg rectally 2 (two) times daily as needed for hemorrhoids.  09/04/16   Tanna Furry, MD  ibuprofen (ADVIL,MOTRIN) 800 MG tablet Take 0.5 tablets (400 mg total) by mouth every 6 (six) hours as needed (pain). 06/16/18   Fawze, Mina A, PA-C  isosorbide dinitrate (ISORDIL) 10 MG tablet Take 1 tablet (10 mg total) by mouth 3 (three) times daily. 12/27/13   Debbe Odea, MD  metFORMIN (GLUCOPHAGE) 500 MG tablet Take 500 mg by mouth 2 (two) times daily with a meal.    [provider]  naproxen (NAPROSYN) 375 MG tablet Take 1 tablet (375 mg total) by mouth 2 (two) times daily. Patient taking differently: Take 375 mg by mouth 2 (two) times daily as needed for mild pain.  01/03/16   [name], MD  ondansetron (ZOFRAN) 4 MG tablet Take 1 tablet (4 mg total) by mouth every 6 (six) hours. Patient taking differently: Take 4 mg by mouth every 8 (eight) hours as needed for nausea.  03/20/16   Montine Circle, PA-C  oxyCODONE-acetaminophen (PERCOCET/ROXICET) 5-325 MG tablet Take 1 tablet by mouth every 8 (eight) hours as needed for moderate pain.  06/10/16   [provider]  pantoprazole (PROTONIX) 20 MG tablet Take 1 tablet (20 mg total) by mouth daily. 07/02/15   [name], MD  phenazopyridine (PYRIDIUM) 100 MG tablet Take 1 tablet (100 mg total) by mouth 3 (three) times daily. 12/07/17   Larene Pickett, PA-C  polyethylene glycol powder (GLYCOLAX/MIRALAX) powder Take 17 g by mouth 2 (two) times daily. Until daily soft stools  OTC  01/02/18   Little, Wenda Overland, MD  pravastatin (PRAVACHOL) 20 MG tablet Take 20 mg by mouth at bedtime.    [provider]  sulfamethoxazole-trimethoprim (BACTRIM DS,SEPTRA DS) 800-160 MG tablet Take 1 tablet by mouth 2 (two) times daily for 7 days. 01/09/19 01/16/19  Orpah Greek, MD  tamsulosin (FLOMAX) 0.4 MG CAPS capsule Take 1 capsule (0.4 mg total) by mouth daily. 07/18/16   Barrett, Lahoma Crocker, PA-C    Family History Family History  Problem Relation Age of Onset  . CAD Father        "heart attack in his 38's"  . Hypertension Father   . Diabetes Brother   . Diabetes Sister   . Hypertension Mother     Social History Social History   Tobacco Use  . Smoking status: Current Every Day Smoker    Packs/day:  0.50    Types: Cigarettes  . Smokeless tobacco: Current User  Substance Use Topics  . Alcohol use: Yes  . Drug use: Yes    Types: Marijuana, Cocaine, IV    Comment: Heroin - one month ago     Allergies   Patient has no known allergies.   Review of Systems Review of Systems  Gastrointestinal: Positive for nausea and vomiting.  Genitourinary: Positive for dysuria and frequency.  All other systems reviewed and are negative.    Physical Exam Updated Vital Signs BP (!) 179/61   Pulse 79   Temp 98.3 F (36.8 C) (Oral)   Resp 16   SpO2 96%   Physical Exam Vitals signs and nursing note reviewed.  Constitutional:      General: He is not in acute distress.    Appearance: Normal appearance. He is well-developed.  HENT:     Head: Normocephalic and atraumatic.     Right Ear: Hearing normal.     Left Ear: Hearing normal.     Nose: Nose normal.  Eyes:     Conjunctiva/sclera: Conjunctivae normal.     Pupils: Pupils are equal, round, and reactive to light.  Neck:     Musculoskeletal: Normal range of motion and neck supple.  Cardiovascular:     Rate and Rhythm: Regular rhythm.     Heart sounds: S1 normal and S2 normal. No murmur. No friction rub. No  gallop.   Pulmonary:     Effort: Pulmonary effort is normal. No respiratory distress.     Breath sounds: Normal breath sounds.  Chest:     Chest wall: No tenderness.  Abdominal:     General: Bowel sounds are normal.     Palpations: Abdomen is soft.     Tenderness: There is abdominal tenderness in the suprapubic area. There is no guarding or rebound. Negative signs include Murphy's sign and McBurney's sign.     Hernia: No hernia is present.  Musculoskeletal: Normal range of motion.  Skin:    General: Skin is warm and dry.     Findings: No rash.  Neurological:     Mental Status: He is alert and oriented to person, place, and time.     GCS: GCS eye subscore is 4. GCS verbal subscore is 5. GCS motor subscore is 6.     Cranial Nerves: No cranial nerve deficit.     Sensory: No sensory deficit.     Coordination: Coordination normal.  Psychiatric:        Speech: Speech normal.        Behavior: Behavior normal.        Thought Content: Thought content normal.      ED Treatments / Results  Labs (all labs ordered are listed, but only abnormal results are displayed) Labs Reviewed  CBC WITH DIFFERENTIAL/PLATELET - Abnormal; Notable for the following components:      Result Value   WBC 11.0 (*)    RBC 6.06 (*)    MCV 75.1 (*)    MCH 22.8 (*)    All other components within normal limits  URINALYSIS, ROUTINE W REFLEX MICROSCOPIC - Abnormal; Notable for the following components:   APPearance CLOUDY (*)    Glucose, UA 50 (*)    Hgb urine dipstick MODERATE (*)    Protein, ur 100 (*)    Leukocytes, UA SMALL (*)    RBC / HPF >50 (*)    WBC, UA >50 (*)    Bacteria, UA MANY (*)  Non Squamous Epithelial 0-5 (*)    All other components within normal limits  COMPREHENSIVE METABOLIC PANEL - Abnormal; Notable for the following components:   Sodium 134 (*)    CO2 18 (*)    Glucose, Bld 217 (*)    BUN 29 (*)    Creatinine, Ser 1.50 (*)    AST 47 (*)    ALT 47 (*)    GFR calc non Af Amer  48 (*)    GFR calc Af Amer 55 (*)    All other components within normal limits  I-STAT CG4 LACTIC ACID, ED - Abnormal; Notable for the following components:   Lactic Acid, Venous 2.31 (*)    All other components within normal limits  URINE CULTURE  LIPASE, BLOOD    EKG None  Radiology No results found.  Procedures Procedures (including critical care time)  Medications Ordered in ED Medications  cefTRIAXone (ROCEPHIN) 1 g in sodium chloride 0.9 % 100 mL IVPB (has no administration in time range)  sodium chloride 0.9 % bolus 1,000 mL (has no administration in time range)  ondansetron (ZOFRAN) injection 4 mg (has no administration in time range)     Initial Impression / Assessment and Plan / ED Course  I have reviewed the triage vital signs and the nursing notes.  Pertinent labs & imaging results that were available during my care of the patient were reviewed by me and considered in my medical decision making (see chart for details).     Patient presents to the emergency department for evaluation of lower abdominal/suprapubic pain with urinary frequency and dysuria.  Patient reports history of recurrent urinary tract infection with similar symptoms.  Patient is afebrile at arrival.  He has not tachycardic or hypotensive.  He appears well, no suspicion for sepsis at this time.  Patient does endorse nausea and vomiting.  He was administered Zofran and IV fluids.  Urinalysis does look similar to previous urinalysis.  Micro from previous visits reviewed.  His pathogen is generally somewhat resistant E. coli.  He has been sensitive to Rocephin and Bactrim in the past.  Reviewing records, he was seen in November of last year with a similar presentation, treated with Rocephin in the ER and discharged with Bactrim, did well.  He has follow-up scheduled in the next couple of weeks with his urologist.  Will be appropriate for outpatient management.  Given return precautions.  Final Clinical  Impressions(s) / ED Diagnoses   Final diagnoses:  Lower urinary tract infectious disease    ED Discharge Orders         Ordered    sulfamethoxazole-trimethoprim (BACTRIM DS,SEPTRA DS) 800-160 MG tablet  2 times daily     01/09/19 0449           Orpah Greek, MD 01/09/19 [phone]

## 2019-01-09 NOTE — ED Notes (Signed)
ED Provider at bedside. 

## 2019-01-09 NOTE — ED Triage Notes (Signed)
Pt reports pain with urination for about 2 days, associated with n/v, unsure of fevers, lower abdominal pain. Has urologist appointment scheduled in January.

## 2019-01-09 NOTE — ED Notes (Signed)
Patient verbalizes understanding of discharge instructions. Opportunity for questioning and answers were provided. Armband removed by staff, pt discharged from ED. Pt ambulated out into lobby with all belongings

## 2019-01-10 LAB — URINE CULTURE: Culture: 50000 — AB

## 2019-04-22 ENCOUNTER — Encounter (HOSPITAL_COMMUNITY): Payer: Self-pay | Admitting: Emergency Medicine

## 2019-04-22 ENCOUNTER — Other Ambulatory Visit: Payer: Self-pay

## 2019-04-22 ENCOUNTER — Emergency Department (HOSPITAL_COMMUNITY)
Admission: EM | Admit: 2019-04-22 | Discharge: 2019-04-22 | Disposition: A | Discharge status: Home / Self Care | Payer: Medicare HMO | Attending: Emergency Medicine | Admitting: Emergency Medicine

## 2019-04-22 DIAGNOSIS — Z7984 Long term (current) use of oral hypoglycemic drugs: Secondary | ICD-10-CM | POA: Insufficient documentation

## 2019-04-22 DIAGNOSIS — I10 Essential (primary) hypertension: Secondary | ICD-10-CM | POA: Diagnosis not present

## 2019-04-22 DIAGNOSIS — Z79899 Other long term (current) drug therapy: Secondary | ICD-10-CM | POA: Insufficient documentation

## 2019-04-22 DIAGNOSIS — R319 Hematuria, unspecified: Secondary | ICD-10-CM

## 2019-04-22 DIAGNOSIS — E119 Type 2 diabetes mellitus without complications: Secondary | ICD-10-CM | POA: Insufficient documentation

## 2019-04-22 DIAGNOSIS — R300 Dysuria: Secondary | ICD-10-CM | POA: Diagnosis present

## 2019-04-22 DIAGNOSIS — N3001 Acute cystitis with hematuria: Secondary | ICD-10-CM | POA: Diagnosis not present

## 2019-04-22 DIAGNOSIS — Z7982 Long term (current) use of aspirin: Secondary | ICD-10-CM | POA: Insufficient documentation

## 2019-04-22 DIAGNOSIS — I5032 Chronic diastolic (congestive) heart failure: Secondary | ICD-10-CM | POA: Insufficient documentation

## 2019-04-22 DIAGNOSIS — F17210 Nicotine dependence, cigarettes, uncomplicated: Secondary | ICD-10-CM | POA: Diagnosis not present

## 2019-04-22 DIAGNOSIS — N390 Urinary tract infection, site not specified: Secondary | ICD-10-CM

## 2019-04-22 LAB — URINALYSIS, ROUTINE W REFLEX MICROSCOPIC
Bilirubin Urine: NEGATIVE
Glucose, UA: NEGATIVE mg/dL
Ketones, ur: NEGATIVE mg/dL
Nitrite: NEGATIVE
Protein, ur: 100 mg/dL — AB
RBC / HPF: >50 RBC/hpf — ABNORMAL HIGH (ref 0–5)
Specific Gravity, Urine: 1.014 (ref 1.005–1.030)
WBC, UA: >50 WBC/hpf — ABNORMAL HIGH (ref 0–5)
pH: 8 (ref 5.0–8.0)

## 2019-04-22 MED ORDER — CEPHALEXIN 500 MG PO CAPS: 500.0000 mg | ORAL_CAPSULE | Freq: Two times a day (BID) | ORAL | 0 refills | Status: AC

## 2019-04-22 MED ORDER — CEPHALEXIN 250 MG PO CAPS
500.0000 mg | ORAL_CAPSULE | Freq: Once | ORAL | Status: AC
  Administered 2019-04-22: 500 mg via ORAL
  Filled 2019-04-22: qty 2

## 2019-04-22 MED ORDER — IBUPROFEN 800 MG PO TABS
800.0000 mg | ORAL_TABLET | Freq: Once | ORAL | Status: AC
  Administered 2019-04-22: 08:00:00 800 mg via ORAL
  Filled 2019-04-22: qty 1

## 2019-04-22 NOTE — ED Notes (Signed)
Patient provided urinal and made aware of need for urine sample

## 2019-04-22 NOTE — ED Notes (Signed)
  Asked patient to give urine specimen and he stated he didn't have to go.  Told patient that we needed the sample or would have to catheterize him.  Patient is attempting to use the urinal at bedside.

## 2019-04-22 NOTE — Discharge Instructions (Signed)
Please take Keflex, 500 mg by mouth twice daily for 7 days.  I have ordered a urinary culture which will tell us if the medicine that you are taking is the appropriate medicine for this infection.  If you should develop increasing back pain fever or vomiting return to the emergency department immediately.  Please take all of the medication.  You may take Tylenol or ibuprofen for pain or discomfort.

## 2019-04-22 NOTE — ED Triage Notes (Addendum)
Patient reports he thinks he has a uti, has had lower abdominal pain and dysuria x1 week and began having hematuria tonight. Denies urinary retention, penile discharge, fever or chills. Reports sig hx of UTI/BPH-has appt with urologist in a couple of months.

## 2019-04-22 NOTE — ED Provider Notes (Signed)
The patient is a well-appearing 68 year old male presenting with a complaint of dysuria.  Soft abdomen, nontender, no febrile, non-tachycardic, well-appearing otherwise.  Urinalysis reviewed showing signs of urinary tract infection, culture was sent, patient will be started on Keflex prior to discharge.  Medical screening examination/treatment/procedure(s) were conducted as a shared visit with non-physician practitioner(s) and myself.  I personally evaluated the patient during the encounter.  Clinical Impression:   Final diagnoses:  Urinary tract infection with hematuria, site unspecified         Noemi Chapel, MD 04/22/19 [phone]

## 2019-04-22 NOTE — ED Provider Notes (Signed)
[facility] EMERGENCY DEPARTMENT Provider Note   CSN: [id number] Arrival date & time: 04/22/19  0544    History   Chief Complaint Chief Complaint  Patient presents with  . Urinary Tract Infection    HPI [name] is a 68 y.o. male.     Patient presents to the emergency department with a chief complaint of dysuria and hematuria.  He reports having had the symptoms x1 week.  States that he was concerned about being seen due to the coronavirus pandemic, and thus the delay in seeking treatment.  He denies any fevers or chills.  Denies any nausea, vomiting, or diarrhea.  He states that he has a history of UTIs.  He denies any penile discharge.  Denies being sexually active.  Denies any other associated symptoms.  The history is provided by the patient. No language interpreter was used.    Past Medical History:  Diagnosis Date  . Alcohol dependence ([place]) 12/10/2013  . Angioedema of lips 12/24/2013  . Brain tumor ([place])   . CHF (congestive heart failure) (H[facility])    diastolic  . Chronic back pain   . Chronic headache   . CVA (cerebral vascular accident) ([place])   . Diabetes mellitus   . HTN (hypertension), malignant 09/06/2012  . Hypertension   . Narcotic abuse ([place])   . Normal cardiac stress test 2009  . TIA (transient ischemic attack) 09/06/2012  . Tobacco use disorder 12/10/2013  . Vertigo 09/06/2012    Patient Active Problem List   Diagnosis Date Noted  . Syncope 01/23/2017  . Elevated troponin   . Nausea 11/01/2016  . Dysuria 11/01/2016  . Chronic diastolic heart failure ([place]) 03/20/2015  . Chest pain 03/18/2015  . History of TIA (transient ischemic attack) 01/27/2015  . Enterococcus UTI 01/27/2015  . Cough 01/27/2015  . Bronchitis 01/27/2015  . HCAP (healthcare-associated pneumonia)   . CHF (congestive heart failure) ([place]) 01/26/2015  . History of alcohol abuse 01/26/2015  . Pyelonephritis 01/12/2015  . Prostatitis 01/12/2015  . Essential  hypertension 11/03/2014  . Hypokalemia 11/03/2014  . Fever 08/15/2014  . Chronic pain 12/24/2013  . Tobacco use disorder 12/10/2013  . Leukocytosis 12/10/2013  . Diabetes mellitus type 2, controlled ([place]) 09/06/2012  . Pain in the side 09/06/2012  . Pituitary adenoma ([place]) 09/06/2012    Past Surgical History:  Procedure Laterality Date  . NO PAST SURGERIES          Home Medications    Prior to Admission medications   Medication Sig Start Date End Date Taking? Authorizing Provider  acetaminophen (TYLENOL) 500 MG tablet Take 1 tablet (500 mg total) by mouth every 6 (six) hours as needed for mild pain. 06/16/18   Fawze, Mina A, PA-C  albuterol (PROVENTIL HFA;VENTOLIN HFA) 108 (90 Base) MCG/ACT inhaler Inhale 1-2 puffs into the lungs every 6 (six) hours as needed for wheezing. 09/04/16   Tanna Furry, MD  amLODipine (NORVASC) 10 MG tablet Take 10 mg by mouth daily.    [provider]  aspirin EC 81 MG tablet Take 81 mg by mouth daily.    [provider]  cyclobenzaprine (FLEXERIL) 10 MG tablet Take 1 tablet (10 mg total) by mouth 3 (three) times daily as needed for muscle spasms. Patient not taking: Reported on 01/09/2019 05/21/16   Dalia Heading, PA-C  docusate sodium (COLACE) 100 MG capsule Take 1 capsule (100 mg total) by mouth every 12 (twelve) hours. Patient taking differently: Take 100 mg by mouth  daily.  01/02/18   Little, Wenda Overland, MD  gabapentin (NEURONTIN) 100 MG capsule Take 100 mg by mouth 3 (three) times daily. 06/10/16   [provider]  hydrALAZINE (APRESOLINE) 100 MG tablet Take 1 tablet (100 mg total) by mouth every 8 (eight) hours. Patient not taking: Reported on 12/11/2017 01/30/15   Reyne Dumas, MD  hydrALAZINE (APRESOLINE) 50 MG tablet Take 50 mg by mouth 3 (three) times daily. 12/09/17   [provider]  hydrochlorothiazide (MICROZIDE) 12.5 MG capsule Take 12.5 mg by mouth daily. 12/09/17   [provider]   HYDROcodone-acetaminophen (NORCO) 5-325 MG tablet Take 1-2 tablets by mouth every 6 (six) hours as needed. Patient taking differently: Take 1-2 tablets by mouth every 6 (six) hours as needed for moderate pain.  12/27/17   Veryl Speak, MD  hydrocortisone (ANUSOL-HC) 25 MG suppository Place 1 suppository (25 mg total) rectally 2 (two) times daily. Patient not taking: Reported on 01/09/2019 09/04/16   Tanna Furry, MD  ibuprofen (ADVIL,MOTRIN) 800 MG tablet Take 0.5 tablets (400 mg total) by mouth every 6 (six) hours as needed (pain). 06/16/18   Fawze, Mina A, PA-C  isosorbide dinitrate (ISORDIL) 10 MG tablet Take 1 tablet (10 mg total) by mouth 3 (three) times daily. 12/27/13   Debbe Odea, MD  metFORMIN (GLUCOPHAGE) 500 MG tablet Take 500 mg by mouth 2 (two) times daily with a meal.    [provider]  naproxen (NAPROSYN) 375 MG tablet Take 1 tablet (375 mg total) by mouth 2 (two) times daily. Patient not taking: Reported on 01/09/2019 01/03/16   [name], MD  ondansetron (ZOFRAN) 4 MG tablet Take 1 tablet (4 mg total) by mouth every 6 (six) hours. Patient taking differently: Take 4 mg by mouth every 8 (eight) hours as needed for nausea.  03/20/16   Montine Circle, PA-C  oxyCODONE-acetaminophen (PERCOCET/ROXICET) 5-325 MG tablet Take 1 tablet by mouth every 8 (eight) hours as needed for moderate pain.  06/10/16   [provider]  pantoprazole (PROTONIX) 20 MG tablet Take 1 tablet (20 mg total) by mouth daily. 07/02/15   [name], MD  phenazopyridine (PYRIDIUM) 100 MG tablet Take 1 tablet (100 mg total) by mouth 3 (three) times daily. Patient not taking: Reported on 01/09/2019 12/07/17   Larene Pickett, PA-C  polyethylene glycol powder (GLYCOLAX/MIRALAX) powder Take 17 g by mouth 2 (two) times daily. Until daily soft stools  OTC Patient taking differently: Take 17 g by mouth daily as needed for mild constipation.  01/02/18   Little, Wenda Overland, MD  pravastatin  (PRAVACHOL) 20 MG tablet Take 20 mg by mouth at bedtime.    [provider]  tamsulosin (FLOMAX) 0.4 MG CAPS capsule Take 1 capsule (0.4 mg total) by mouth daily. 07/18/16   Barrett, Lahoma Crocker, PA-C    Family History Family History  Problem Relation Age of Onset  . CAD Father        "heart attack in his 45's"  . Hypertension Father   . Diabetes Brother   . Diabetes Sister   . Hypertension Mother     Social History Social History   Tobacco Use  . Smoking status: Current Every Day Smoker    Packs/day: 0.50    Types: Cigarettes  . Smokeless tobacco: Current User  Substance Use Topics  . Alcohol use: Yes  . Drug use: Yes    Types: Marijuana, Cocaine, IV    Comment: Heroin - one month ago  Allergies   Patient has no known allergies.   Review of Systems Review of Systems  All other systems reviewed and are negative.    Physical Exam Updated Vital Signs BP (!) 165/107   Pulse 93   Temp 98.4 F (36.9 C) (Oral)   Resp 15   SpO2 97%   Physical Exam Vitals signs and nursing note reviewed.  Constitutional:      Appearance: He is well-developed.  HENT:     Head: Normocephalic and atraumatic.  Eyes:     Conjunctiva/sclera: Conjunctivae normal.  Neck:     Musculoskeletal: Neck supple.  Cardiovascular:     Rate and Rhythm: Normal rate.     Heart sounds: No murmur.  Abdominal:     Palpations: Abdomen is soft.     Tenderness: There is no abdominal tenderness.  Genitourinary:    Comments: Uncircumcised male, no penile discharge, no masses or lesions Skin:    General: Skin is warm and dry.  Neurological:     Mental Status: He is alert.      ED Treatments / Results  Labs (all labs ordered are listed, but only abnormal results are displayed) Labs Reviewed  URINALYSIS, ROUTINE W REFLEX MICROSCOPIC    EKG None  Radiology No results found.  Procedures Procedures (including critical care time)  Medications Ordered in ED Medications - No data  to display   Initial Impression / Assessment and Plan / ED Course  I have reviewed the triage vital signs and the nursing notes.  Pertinent labs & imaging results that were available during my care of the patient were reviewed by me and considered in my medical decision making (see chart for details).       Patient with dysuria and hematuria x1 week.  No fevers, nausea, or vomiting.  Abdomen is soft and nontender.  Denies any sexual contacts.  Will check urinalysis.  He is nontoxic-appearing.   Patient signed out to Dr. Sabra Heck, who will follow-up on UA and treat and dispo accordingly.  Final Clinical Impressions(s) / ED Diagnoses   Final diagnoses:  None    ED Discharge Orders    None       Montine Circle, PA-C 04/22/19 0923    Mesner, Corene Cornea, MD 04/22/19 [phone]

## 2019-04-23 LAB — URINE CULTURE: Culture: NO GROWTH

## 2019-07-02 ENCOUNTER — Telehealth: Payer: Self-pay | Admitting: Surgery

## 2019-07-02 ENCOUNTER — Other Ambulatory Visit: Payer: Self-pay

## 2019-07-02 ENCOUNTER — Encounter (HOSPITAL_COMMUNITY): Payer: Self-pay | Admitting: Emergency Medicine

## 2019-07-02 ENCOUNTER — Emergency Department (HOSPITAL_COMMUNITY)
Admission: EM | Admit: 2019-07-02 | Discharge: 2019-07-02 | Disposition: A | Discharge status: Home / Self Care | Payer: Medicare HMO | Attending: Emergency Medicine | Admitting: Emergency Medicine

## 2019-07-02 DIAGNOSIS — I110 Hypertensive heart disease with heart failure: Secondary | ICD-10-CM | POA: Diagnosis not present

## 2019-07-02 DIAGNOSIS — Z7984 Long term (current) use of oral hypoglycemic drugs: Secondary | ICD-10-CM | POA: Diagnosis not present

## 2019-07-02 DIAGNOSIS — R300 Dysuria: Secondary | ICD-10-CM | POA: Diagnosis present

## 2019-07-02 DIAGNOSIS — I5032 Chronic diastolic (congestive) heart failure: Secondary | ICD-10-CM | POA: Insufficient documentation

## 2019-07-02 DIAGNOSIS — F17210 Nicotine dependence, cigarettes, uncomplicated: Secondary | ICD-10-CM | POA: Insufficient documentation

## 2019-07-02 DIAGNOSIS — Z79899 Other long term (current) drug therapy: Secondary | ICD-10-CM | POA: Diagnosis not present

## 2019-07-02 DIAGNOSIS — N3001 Acute cystitis with hematuria: Secondary | ICD-10-CM | POA: Insufficient documentation

## 2019-07-02 DIAGNOSIS — E119 Type 2 diabetes mellitus without complications: Secondary | ICD-10-CM | POA: Diagnosis not present

## 2019-07-02 DIAGNOSIS — Z8639 Personal history of other endocrine, nutritional and metabolic disease: Secondary | ICD-10-CM | POA: Diagnosis not present

## 2019-07-02 DIAGNOSIS — Z7982 Long term (current) use of aspirin: Secondary | ICD-10-CM | POA: Diagnosis not present

## 2019-07-02 HISTORY — DX: Urinary tract infection, site not specified: N39.0

## 2019-07-02 LAB — URINALYSIS, ROUTINE W REFLEX MICROSCOPIC
Bilirubin Urine: NEGATIVE
Glucose, UA: NEGATIVE mg/dL
Ketones, ur: NEGATIVE mg/dL
Nitrite: NEGATIVE
Protein, ur: 100 mg/dL — AB
RBC / HPF: >50 RBC/hpf — ABNORMAL HIGH (ref 0–5)
Specific Gravity, Urine: 1.015 (ref 1.005–1.030)
WBC, UA: >50 WBC/hpf — ABNORMAL HIGH (ref 0–5)
pH: 8 (ref 5.0–8.0)

## 2019-07-02 MED ORDER — CEPHALEXIN 500 MG PO CAPS: 500.0000 mg | ORAL_CAPSULE | Freq: Four times a day (QID) | ORAL | 0 refills | Status: AC

## 2019-07-02 MED ORDER — CEPHALEXIN 250 MG PO CAPS
500.0000 mg | ORAL_CAPSULE | Freq: Once | ORAL | Status: AC
  Administered 2019-07-02: 07:00:00 500 mg via ORAL
  Filled 2019-07-02: qty 2

## 2019-07-02 NOTE — ED Provider Notes (Signed)
[facility] EMERGENCY DEPARTMENT Provider Note   CSN: [id number] Arrival date & time: 07/02/19  0947    History   Chief Complaint Chief Complaint  Patient presents with  . Urinary Tract Infection    HPI [name] is a 68 y.o. male possible history of alcohol dependence, CHF, CVA, diabetes who presents for evaluation of dysuria that began today.  Patient states that he also noted that his urine was very concentrated.  Patient states that he has a history of UTIs and he is concerned that he has another 1.  He reports that his last one was in April.  He does not know why he gets them.  He has not had any fever, nausea/vomiting, difficulty eating or drinking.  He reports occasionally, he will have some suprapubic tenderness that he notes is worse with urination.  He has not noted any other abdominal pain.  He denies any testicular pain or swelling, penile pain or swelling.    The history is provided by the patient.    Past Medical History:  Diagnosis Date  . Alcohol dependence ([place]) 12/10/2013  . Angioedema of lips 12/24/2013  . Brain tumor ([place])   . CHF (congestive heart failure) (HCC)    diastolic  . Chronic back pain   . Chronic headache   . CVA (cerebral vascular accident) ([place])   . Diabetes mellitus   . HTN (hypertension), malignant 09/06/2012  . Hypertension   . Narcotic abuse ([place])   . Normal cardiac stress test 2009  . TIA (transient ischemic attack) 09/06/2012  . Tobacco use disorder 12/10/2013  . UTI (urinary tract infection)   . Vertigo 09/06/2012    Patient Active Problem List   Diagnosis Date Noted  . Syncope 01/23/2017  . Elevated troponin   . Nausea 11/01/2016  . Dysuria 11/01/2016  . Chronic diastolic heart failure ([place]) 03/20/2015  . Chest pain 03/18/2015  . History of TIA (transient ischemic attack) 01/27/2015  . Enterococcus UTI 01/27/2015  . Cough 01/27/2015  . Bronchitis 01/27/2015  . HCAP (healthcare-associated pneumonia)   .  CHF (congestive heart failure) ([place]) 01/26/2015  . History of alcohol abuse 01/26/2015  . Pyelonephritis 01/12/2015  . Prostatitis 01/12/2015  . Essential hypertension 11/03/2014  . Hypokalemia 11/03/2014  . Fever 08/15/2014  . Chronic pain 12/24/2013  . Tobacco use disorder 12/10/2013  . Leukocytosis 12/10/2013  . Diabetes mellitus type 2, controlled ([place]) 09/06/2012  . Pain in the side 09/06/2012  . Pituitary adenoma ([place]) 09/06/2012    Past Surgical History:  Procedure Laterality Date  . NO PAST SURGERIES          Home Medications    Prior to Admission medications   Medication Sig Start Date End Date Taking? Authorizing Provider  acetaminophen (TYLENOL) 500 MG tablet Take 1 tablet (500 mg total) by mouth every 6 (six) hours as needed for mild pain. 06/16/18   Fawze, Mina A, PA-C  albuterol (PROVENTIL HFA;VENTOLIN HFA) 108 (90 Base) MCG/ACT inhaler Inhale 1-2 puffs into the lungs every 6 (six) hours as needed for wheezing. 09/04/16   Tanna Furry, MD  amLODipine (NORVASC) 10 MG tablet Take 10 mg by mouth daily.    [provider]  aspirin EC 81 MG tablet Take 81 mg by mouth daily.    [provider]  cephALEXin (KEFLEX) 500 MG capsule Take 1 capsule (500 mg total) by mouth 4 (four) times daily for 7 days. 07/02/19 07/09/19  Volanda Napoleon, PA-C  cyclobenzaprine (FLEXERIL) 10 MG tablet Take 1 tablet (10 mg total) by mouth 3 (three) times daily as needed for muscle spasms. Patient not taking: Reported on 01/09/2019 05/21/16   Dalia Heading, PA-C  docusate sodium (COLACE) 100 MG capsule Take 1 capsule (100 mg total) by mouth every 12 (twelve) hours. Patient taking differently: Take 100 mg by mouth daily.  01/02/18   Little, Wenda Overland, MD  gabapentin (NEURONTIN) 100 MG capsule Take 100 mg by mouth 3 (three) times daily. 06/10/16   [provider]  hydrALAZINE (APRESOLINE) 100 MG tablet Take 1 tablet (100 mg total) by mouth every 8 (eight) hours. Patient  not taking: Reported on 12/11/2017 01/30/15   Reyne Dumas, MD  hydrALAZINE (APRESOLINE) 50 MG tablet Take 50 mg by mouth 3 (three) times daily. 12/09/17   [provider]  hydrochlorothiazide (MICROZIDE) 12.5 MG capsule Take 12.5 mg by mouth daily. 12/09/17   [provider]  HYDROcodone-acetaminophen (NORCO) 5-325 MG tablet Take 1-2 tablets by mouth every 6 (six) hours as needed. Patient taking differently: Take 1-2 tablets by mouth every 6 (six) hours as needed for moderate pain.  12/27/17   Veryl Speak, MD  hydrocortisone (ANUSOL-HC) 25 MG suppository Place 1 suppository (25 mg total) rectally 2 (two) times daily. Patient not taking: Reported on 01/09/2019 09/04/16   Tanna Furry, MD  ibuprofen (ADVIL,MOTRIN) 800 MG tablet Take 0.5 tablets (400 mg total) by mouth every 6 (six) hours as needed (pain). 06/16/18   Fawze, Mina A, PA-C  isosorbide dinitrate (ISORDIL) 10 MG tablet Take 1 tablet (10 mg total) by mouth 3 (three) times daily. 12/27/13   Debbe Odea, MD  metFORMIN (GLUCOPHAGE) 500 MG tablet Take 500 mg by mouth 2 (two) times daily with a meal.    [provider]  naproxen (NAPROSYN) 375 MG tablet Take 1 tablet (375 mg total) by mouth 2 (two) times daily. Patient not taking: Reported on 01/09/2019 01/03/16   [name], MD  ondansetron (ZOFRAN) 4 MG tablet Take 1 tablet (4 mg total) by mouth every 6 (six) hours. Patient taking differently: Take 4 mg by mouth every 8 (eight) hours as needed for nausea.  03/20/16   Montine Circle, PA-C  oxyCODONE-acetaminophen (PERCOCET/ROXICET) 5-325 MG tablet Take 1 tablet by mouth every 8 (eight) hours as needed for moderate pain.  06/10/16   [provider]  pantoprazole (PROTONIX) 20 MG tablet Take 1 tablet (20 mg total) by mouth daily. 07/02/15   [name], MD  phenazopyridine (PYRIDIUM) 100 MG tablet Take 1 tablet (100 mg total) by mouth 3 (three) times daily. Patient not taking: Reported on 01/09/2019 12/07/17    Larene Pickett, PA-C  polyethylene glycol powder (GLYCOLAX/MIRALAX) powder Take 17 g by mouth 2 (two) times daily. Until daily soft stools  OTC Patient taking differently: Take 17 g by mouth daily as needed for mild constipation.  01/02/18   Little, Wenda Overland, MD  pravastatin (PRAVACHOL) 20 MG tablet Take 20 mg by mouth at bedtime.    [provider]  tamsulosin (FLOMAX) 0.4 MG CAPS capsule Take 1 capsule (0.4 mg total) by mouth daily. 07/18/16   Barrett, Lahoma Crocker, PA-C    Family History Family History  Problem Relation Age of Onset  . CAD Father        "heart attack in his 36's"  . Hypertension Father   . Diabetes Brother   . Diabetes Sister   . Hypertension Mother     Social History Social History  Tobacco Use  . Smoking status: Current Every Day Smoker    Packs/day: 0.50    Types: Cigarettes  . Smokeless tobacco: Current User  Substance Use Topics  . Alcohol use: Yes  . Drug use: Yes    Types: Marijuana, Cocaine, IV    Comment: Heroin - one month ago     Allergies   Patient has no known allergies.   Review of Systems Review of Systems  Constitutional: Negative for fever.  Respiratory: Negative for shortness of breath.   Cardiovascular: Negative for chest pain.  Gastrointestinal: Negative for abdominal pain, nausea and vomiting.  Genitourinary: Positive for dysuria. Negative for hematuria, penile swelling, scrotal swelling and testicular pain.  Neurological: Negative for headaches.  All other systems reviewed and are negative.    Physical Exam Updated Vital Signs BP (!) 160/59 (BP Location: Right Arm)   Pulse 67   Temp 98.8 F (37.1 C) (Oral)   Resp 16   SpO2 96%   Physical Exam Vitals signs and nursing note reviewed. Exam conducted with a chaperone present.  Constitutional:      Appearance: Normal appearance. He is well-developed.  HENT:     Head: Normocephalic and atraumatic.  Eyes:     General: Lids are normal.      Conjunctiva/sclera: Conjunctivae normal.     Pupils: Pupils are equal, round, and reactive to light.  Neck:     Musculoskeletal: Full passive range of motion without pain.  Cardiovascular:     Rate and Rhythm: Normal rate and regular rhythm.     Pulses: Normal pulses.     Heart sounds: Normal heart sounds. No murmur. No friction rub. No gallop.   Pulmonary:     Effort: Pulmonary effort is normal.     Breath sounds: Normal breath sounds.  Abdominal:     Palpations: Abdomen is soft. Abdomen is not rigid.     Tenderness: There is abdominal tenderness in the suprapubic area. There is no guarding.     Hernia: There is no hernia in the left inguinal area or right inguinal area.     Comments: Mild suprapubic abdominal tenderness.  Abdomen soft, nondistended.  No CVA tenderness noted bilaterally.  No rigidity, guarding.  Genitourinary:    Penis: Normal and uncircumcised. No phimosis or paraphimosis.      Scrotum/Testes:        Right: Tenderness or swelling not present.        Left: Tenderness or swelling not present.     Comments: The exam was performed with a chaperone present. Normal male genitalia. No evidence of rash, ulcers or lesions.  Musculoskeletal: Normal range of motion.  Skin:    General: Skin is warm and dry.     Capillary Refill: Capillary refill takes less than 2 seconds.  Neurological:     Mental Status: He is alert and oriented to person, place, and time.  Psychiatric:        Speech: Speech normal.      ED Treatments / Results  Labs (all labs ordered are listed, but only abnormal results are displayed) Labs Reviewed  URINALYSIS, ROUTINE W REFLEX MICROSCOPIC - Abnormal; Notable for the following components:      Result Value   APPearance TURBID (*)    Hgb urine dipstick SMALL (*)    Protein, ur 100 (*)    Leukocytes,Ua LARGE (*)    RBC / HPF >50 (*)    WBC, UA >50 (*)    Bacteria, UA RARE (*)  Non Squamous Epithelial 0-5 (*)    All other components within  normal limits  URINE CULTURE    EKG None  Radiology No results found.  Procedures Procedures (including critical care time)  Medications Ordered in ED Medications  cephALEXin (KEFLEX) capsule 500 mg (500 mg Oral Given 07/02/19 8032)     Initial Impression / Assessment and Plan / ED Course  I have reviewed the triage vital signs and the nursing notes.  Pertinent labs & imaging results that were available during my care of the patient were reviewed by me and considered in my medical decision making (see chart for details).        68 year old male who presents for evaluation of dysuria.  He states he has had UTIs before and he is concerned that he has a bladder infection again.  No fevers, nausea/vomiting. Patient is afebrile, non-toxic appearing, sitting comfortably on examination table. Vital signs reviewed and stable.  On exam, he has some mild suprapubic abdominal tenderness.  No rigidity, guarding.  Normal GU exam.  No CVA tenderness.  Consider UTI.  History/physical exam not concerning for kidney stone, pyelonephritis, prostatitis.  Additionally, do not suspect intra-abdominal infection given reassuring history/physical exam.  UA shows large leukocytes, pyuria as well as hemoglobin.  Urine culture sent.  Review of records show that he has tolerated Keflex in the past.  Will plan for Keflex.  Dose given here in the ED.  Discussed results with patient.  Reevaluation.  He is resting comfortably on examination table.  Repeat abdominal exam is benign.  He is eating and drinking without any difficulty.  Vital signs are stable.  At this time, patient is appropriate for outpatient management of UTI.  Urine culture sent. At this time, patient exhibits no emergent life-threatening condition that require further evaluation in ED or admission. Discussed patient with Dr. Roxanne Mins who is agreeable to plan. Patient had ample opportunity for questions and discussion. All patient's questions were  answered with full understanding. Strict return precautions discussed. Patient expresses understanding and agreement to plan.   Portions of this note were generated with [employer]. Dictation errors may occur despite best attempts at proofreading.  Final Clinical Impressions(s) / ED Diagnoses   Final diagnoses:  Acute cystitis with hematuria    ED Discharge Orders         Ordered    cephALEXin (KEFLEX) 500 MG capsule  4 times daily     07/02/19 0656           Volanda Napoleon, PA-C 12/21/81 5003    Delora Fuel, MD 70/48/88 [phone]

## 2019-07-02 NOTE — Telephone Encounter (Signed)
ED CM received call from patient concerning discharge prescription needing clarification for pharmacy, CM reviewed record and contacted [facility] on [address] at patient's request to clarify, no further ED CM needs identified.

## 2019-07-02 NOTE — Discharge Instructions (Signed)
Take antibiotics as directed. Please take all of your antibiotics until finished.  Follow-up with your primary care doctor as directed.  Return to the emergency department for any fever, vomiting, abdominal pain or any other worsening or concerning symptoms.

## 2019-07-02 NOTE — ED Triage Notes (Signed)
Patient suspects " bladder infection" states dysuria / concentrated urine with no hematuria , denies fever or chills .

## 2019-07-02 NOTE — ED Notes (Signed)
Patient verbalizes understanding of discharge instructions. Opportunity for questioning and answers were provided. Armband removed by staff, pt discharged from ED.  

## 2019-07-03 LAB — URINE CULTURE: Special Requests: NORMAL

## 2019-08-17 ENCOUNTER — Emergency Department (HOSPITAL_COMMUNITY)
Admission: EM | Admit: 2019-08-17 | Discharge: 2019-08-17 | Disposition: A | Discharge status: Home / Self Care | Payer: Medicare HMO | Attending: Emergency Medicine | Admitting: Emergency Medicine

## 2019-08-17 ENCOUNTER — Other Ambulatory Visit: Payer: Self-pay

## 2019-08-17 ENCOUNTER — Encounter (HOSPITAL_COMMUNITY): Payer: Self-pay | Admitting: Emergency Medicine

## 2019-08-17 DIAGNOSIS — F17210 Nicotine dependence, cigarettes, uncomplicated: Secondary | ICD-10-CM | POA: Diagnosis not present

## 2019-08-17 DIAGNOSIS — E119 Type 2 diabetes mellitus without complications: Secondary | ICD-10-CM | POA: Insufficient documentation

## 2019-08-17 DIAGNOSIS — Z7982 Long term (current) use of aspirin: Secondary | ICD-10-CM | POA: Diagnosis not present

## 2019-08-17 DIAGNOSIS — N390 Urinary tract infection, site not specified: Secondary | ICD-10-CM | POA: Diagnosis not present

## 2019-08-17 DIAGNOSIS — I110 Hypertensive heart disease with heart failure: Secondary | ICD-10-CM | POA: Diagnosis not present

## 2019-08-17 DIAGNOSIS — Z79899 Other long term (current) drug therapy: Secondary | ICD-10-CM | POA: Diagnosis not present

## 2019-08-17 DIAGNOSIS — I5032 Chronic diastolic (congestive) heart failure: Secondary | ICD-10-CM | POA: Diagnosis not present

## 2019-08-17 DIAGNOSIS — I951 Orthostatic hypotension: Secondary | ICD-10-CM | POA: Insufficient documentation

## 2019-08-17 DIAGNOSIS — Z7984 Long term (current) use of oral hypoglycemic drugs: Secondary | ICD-10-CM | POA: Diagnosis not present

## 2019-08-17 DIAGNOSIS — R42 Dizziness and giddiness: Secondary | ICD-10-CM | POA: Diagnosis present

## 2019-08-17 LAB — CBC WITH DIFFERENTIAL/PLATELET
Abs Immature Granulocytes: 0.05 10*3/uL (ref 0.00–0.07)
Basophils Absolute: 0.1 10*3/uL (ref 0.0–0.1)
Basophils Relative: 1 %
Eosinophils Absolute: 0.5 10*3/uL (ref 0.0–0.5)
Eosinophils Relative: 4 %
HCT: 46.2 % (ref 39.0–52.0)
Hemoglobin: 14.8 g/dL (ref 13.0–17.0)
Immature Granulocytes: 0 %
Lymphocytes Relative: 27 %
Lymphs Abs: 3.3 10*3/uL (ref 0.7–4.0)
MCH: 23.6 pg — ABNORMAL LOW (ref 26.0–34.0)
MCHC: 32 g/dL (ref 30.0–36.0)
MCV: 73.7 fL — ABNORMAL LOW (ref 80.0–100.0)
Monocytes Absolute: 0.8 10*3/uL (ref 0.1–1.0)
Monocytes Relative: 7 %
Neutro Abs: 7.5 10*3/uL (ref 1.7–7.7)
Neutrophils Relative %: 61 %
Platelets: 270 10*3/uL (ref 150–400)
RBC: 6.27 MIL/uL — ABNORMAL HIGH (ref 4.22–5.81)
RDW: 15.9 % — ABNORMAL HIGH (ref 11.5–15.5)
WBC: 12.3 10*3/uL — ABNORMAL HIGH (ref 4.0–10.5)
nRBC: 0 % (ref 0.0–0.2)

## 2019-08-17 LAB — BASIC METABOLIC PANEL
Anion gap: 11 (ref 5–15)
BUN: 29 mg/dL — ABNORMAL HIGH (ref 8–23)
CO2: 20 mmol/L — ABNORMAL LOW (ref 22–32)
Calcium: 9.5 mg/dL (ref 8.9–10.3)
Chloride: 107 mmol/L (ref 98–111)
Creatinine, Ser: 1.52 mg/dL — ABNORMAL HIGH (ref 0.61–1.24)
GFR calc Af Amer: 54 mL/min — ABNORMAL LOW (ref >60)
GFR calc non Af Amer: 47 mL/min — ABNORMAL LOW (ref >60)
Glucose, Bld: 171 mg/dL — ABNORMAL HIGH (ref 70–99)
Potassium: 4.6 mmol/L (ref 3.5–5.1)
Sodium: 138 mmol/L (ref 135–145)

## 2019-08-17 LAB — URINALYSIS, ROUTINE W REFLEX MICROSCOPIC
Bilirubin Urine: NEGATIVE
Glucose, UA: NEGATIVE mg/dL
Ketones, ur: NEGATIVE mg/dL
Nitrite: NEGATIVE
Protein, ur: 30 mg/dL — AB
Specific Gravity, Urine: 1.012 (ref 1.005–1.030)
WBC, UA: >50 WBC/hpf — ABNORMAL HIGH (ref 0–5)
pH: 8 (ref 5.0–8.0)

## 2019-08-17 LAB — ETHANOL: Alcohol, Ethyl (B): <10 mg/dL (ref <10)

## 2019-08-17 MED ORDER — SULFAMETHOXAZOLE-TRIMETHOPRIM 800-160 MG PO TABS: 1.0000 | ORAL_TABLET | Freq: Two times a day (BID) | ORAL | 0 refills | Status: AC

## 2019-08-17 MED ORDER — SULFAMETHOXAZOLE-TRIMETHOPRIM 800-160 MG PO TABS
1.0000 | ORAL_TABLET | Freq: Once | ORAL | Status: AC
  Administered 2019-08-17: 1 via ORAL
  Filled 2019-08-17: qty 1

## 2019-08-17 MED ORDER — FLUCONAZOLE 150 MG PO TABS
150.0000 mg | ORAL_TABLET | Freq: Once | ORAL | Status: AC
  Administered 2019-08-17: 150 mg via ORAL
  Filled 2019-08-17: qty 1

## 2019-08-17 MED ORDER — SODIUM CHLORIDE 0.9 % IV BOLUS
1000.0000 mL | Freq: Once | INTRAVENOUS | Status: AC
  Administered 2019-08-17: 1000 mL via INTRAVENOUS

## 2019-08-17 NOTE — ED Triage Notes (Addendum)
Per [facility], Pt reports feeling dizzy when standing up. EMS reports pt orthostatic. BP sitting 122/40, standing 78/40. Pt reports headache x 1 day. Pt denies CP, N/V.  Pt reports recurrent UTIs. He reports some intermittent discomfort in his lower abdomen.

## 2019-08-17 NOTE — ED Provider Notes (Signed)
[facility] EMERGENCY DEPARTMENT Provider Note   CSN: [id number] Arrival date & time: 08/17/19  1324    History   Chief Complaint Chief Complaint  Patient presents with  . Dizziness    HPI [name] is a 68 y.o. 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  He states it began this morning, he feels very lightheaded and dizzy like he will pass out when he goes from laying/sitting to standing.  He states it is so bad he is to sit down.  He denies any associated palpitations, chest pain, new shortness of breath or headaches, vision changes.  He thinks his oral intake has been normal.  He is compliant with his blood pressure medications and took his normal dose this morning.  He denies any urinary symptoms or infectious symptoms. Though he does report hx of recurrent UTI.     The history is provided by the patient.    Past Medical History:  Diagnosis Date  . Alcohol dependence ([place]) 12/10/2013  . Angioedema of lips 12/24/2013  . Brain tumor ([place])   . CHF (congestive heart failure) (HCC)    diastolic  . Chronic back pain   . Chronic headache   . CVA (cerebral vascular accident) ([place])   . Diabetes mellitus   . HTN (hypertension), malignant 09/06/2012  . Hypertension   . Narcotic abuse ([place])   . Normal cardiac stress test 2009  . TIA (transient ischemic attack) 09/06/2012  . Tobacco use disorder 12/10/2013  . UTI (urinary tract infection)   . Vertigo 09/06/2012    Patient Active Problem List   Diagnosis Date Noted  . Syncope 01/23/2017  . Elevated troponin   . Nausea 11/01/2016  . Dysuria 11/01/2016  . Chronic diastolic heart failure ([place]) 03/20/2015  . Chest pain 03/18/2015  . History of TIA (transient ischemic attack) 01/27/2015  . Enterococcus UTI  01/27/2015  . Cough 01/27/2015  . Bronchitis 01/27/2015  . HCAP (healthcare-associated pneumonia)   . CHF (congestive heart failure) ([place]) 01/26/2015  . History of alcohol abuse 01/26/2015  . Pyelonephritis 01/12/2015  . Prostatitis 01/12/2015  . Essential hypertension 11/03/2014  . Hypokalemia 11/03/2014  . Fever 08/15/2014  . Chronic pain 12/24/2013  . Tobacco use disorder 12/10/2013  . Leukocytosis 12/10/2013  . Diabetes mellitus type 2, controlled ([place]) 09/06/2012  . Pain in the side 09/06/2012  . Pituitary adenoma ([place]) 09/06/2012    Past Surgical History:  Procedure Laterality Date  . NO PAST SURGERIES          Home Medications    Prior to Admission medications   Medication Sig Start Date End Date Taking? Authorizing Provider  acetaminophen (TYLENOL) 500 MG tablet Take 1 tablet (500 mg total) by mouth every 6 (six) hours as needed for mild pain. 06/16/18  Yes Fawze, Mina A, PA-C  albuterol (PROVENTIL HFA;VENTOLIN HFA) 108 (90 Base) MCG/ACT inhaler Inhale 1-2 puffs into the lungs every 6 (six) hours as needed for wheezing. 09/04/16  Yes Tanna Furry, MD  amLODipine (NORVASC) 10 MG tablet Take 10 mg by mouth daily.   Yes [provider]  aspirin EC 81 MG tablet Take 81 mg by mouth daily.   Yes [provider]  docusate sodium (COLACE) 100 MG capsule Take 1 capsule (100 mg total) by mouth every 12 (twelve) hours. Patient  taking differently: Take 100 mg by mouth daily.  01/02/18  Yes Little, Wenda Overland, MD  gabapentin (NEURONTIN) 100 MG capsule Take 100 mg by mouth 3 (three) times daily. 06/10/16  Yes [provider]  hydrochlorothiazide (MICROZIDE) 12.5 MG capsule Take 12.5 mg by mouth daily. 12/09/17  Yes [provider]  HYDROcodone-acetaminophen (NORCO) 5-325 MG tablet Take 1-2 tablets by mouth every 6 (six) hours as needed. Patient taking differently: Take 1-2 tablets by mouth every 6 (six) hours as needed for moderate pain.  12/27/17   Yes Delo, Nathaneil Canary, MD  ibuprofen (ADVIL,MOTRIN) 800 MG tablet Take 0.5 tablets (400 mg total) by mouth every 6 (six) hours as needed (pain). 06/16/18  Yes Fawze, Mina A, PA-C  isosorbide dinitrate (ISORDIL) 10 MG tablet Take 1 tablet (10 mg total) by mouth 3 (three) times daily. 12/27/13  Yes Debbe Odea, MD  metFORMIN (GLUCOPHAGE) 500 MG tablet Take 500 mg by mouth 2 (two) times daily with a meal.   Yes [provider]  ondansetron (ZOFRAN) 4 MG tablet Take 1 tablet (4 mg total) by mouth every 6 (six) hours. Patient taking differently: Take 4 mg by mouth every 8 (eight) hours as needed for nausea.  03/20/16  Yes Montine Circle, PA-C  oxyCODONE-acetaminophen (PERCOCET/ROXICET) 5-325 MG tablet Take 1 tablet by mouth every 8 (eight) hours as needed for moderate pain.  06/10/16  Yes [provider]  pantoprazole (PROTONIX) 20 MG tablet Take 1 tablet (20 mg total) by mouth daily. 07/02/15  Yes [name], MD  pravastatin (PRAVACHOL) 20 MG tablet Take 20 mg by mouth at bedtime.   Yes [provider]  tamsulosin (FLOMAX) 0.4 MG CAPS capsule Take 1 capsule (0.4 mg total) by mouth daily. 07/18/16  Yes Barrett, Lahoma Crocker, PA-C  sulfamethoxazole-trimethoprim (BACTRIM DS) 800-160 MG tablet Take 1 tablet by mouth 2 (two) times daily for 7 days. 08/17/19 08/24/19  Sebrina Kessner, [place] N, PA-C    Family History Family History  Problem Relation Age of Onset  . CAD Father        "heart attack in his 41's"  . Hypertension Father   . Diabetes Brother   . Diabetes Sister   . Hypertension Mother     Social History Social History   Tobacco Use  . Smoking status: Current Every Day Smoker    Packs/day: 0.50    Types: Cigarettes  . Smokeless tobacco: Current User  Substance Use Topics  . Alcohol use: Yes  . Drug use: Yes    Types: Marijuana, Cocaine, IV    Comment: Heroin - one month ago     Allergies   Patient has no known allergies.   Review of Systems Review of Systems   Neurological: Positive for light-headedness.  All other systems reviewed and are negative.    Physical Exam Updated Vital Signs BP (!) 190/53   Pulse (!) 59   Temp 98.1 F (36.7 C) (Oral)   Resp 12   SpO2 99%   Physical Exam Vitals signs and nursing note reviewed.  Constitutional:      General: He is not in acute distress.    Appearance: He is well-developed.  HENT:     Head: Normocephalic and atraumatic.  Eyes:     Extraocular Movements: Extraocular movements intact.     Conjunctiva/sclera: Conjunctivae normal.  Cardiovascular:     Rate and Rhythm: Normal rate and regular rhythm.  Pulmonary:     Effort: Pulmonary effort is normal. No respiratory distress.  Breath sounds: Normal breath sounds.  Abdominal:     General: Bowel sounds are normal.     Palpations: Abdomen is soft.     Tenderness: There is no abdominal tenderness. There is no guarding or rebound.  Skin:    General: Skin is warm.  Neurological:     Mental Status: He is alert.     Comments: Speech is clear.  Oriented x3.  Left upper and lower extremity with 3/5 strength compared to right upper and lower extremities with 5/5 strength.  (This is baseline after stroke) Normal tone.  No obvious cranial nerve deficit.  Psychiatric:        Behavior: Behavior normal.      ED Treatments / Results  Labs (all labs ordered are listed, but only abnormal results are displayed) Labs Reviewed  URINALYSIS, ROUTINE W REFLEX MICROSCOPIC - Abnormal; Notable for the following components:      Result Value   APPearance CLOUDY (*)    Hgb urine dipstick SMALL (*)    Protein, ur 30 (*)    Leukocytes,Ua LARGE (*)    WBC, UA >50 (*)    Bacteria, UA MANY (*)    All other components within normal limits  BASIC METABOLIC PANEL - Abnormal; Notable for the following components:   CO2 20 (*)    Glucose, Bld 171 (*)    BUN 29 (*)    Creatinine, Ser 1.52 (*)    GFR calc non Af Amer 47 (*)    GFR calc Af Amer 54 (*)    All  other components within normal limits  CBC WITH DIFFERENTIAL/PLATELET - Abnormal; Notable for the following components:   WBC 12.3 (*)    RBC 6.27 (*)    MCV 73.7 (*)    MCH 23.6 (*)    RDW 15.9 (*)    All other components within normal limits  URINE CULTURE  ETHANOL    EKG EKG Interpretation  Date/Time:  Wednesday August 17 2019 13:36:42 EDT Ventricular Rate:  52 PR Interval:    QRS Duration: 86 QT Interval:  489 QTC Calculation: 455 R Axis:   10 Text Interpretation:  Sinus rhythm Probable left atrial enlargement Anteroseptal infarct, old Confirmed by Veryl Speak [id number]) on 08/17/2019 1:57:10 PM   Radiology No results found.  Procedures Procedures (including critical care time)  Medications Ordered in ED Medications  sodium chloride 0.9 % bolus 1,000 mL (0 mLs Intravenous Stopped 08/17/19 1535)  sodium chloride 0.9 % bolus 1,000 mL (0 mLs Intravenous Stopped 08/17/19 1819)  sulfamethoxazole-trimethoprim (BACTRIM DS) 800-160 MG per tablet 1 tablet (1 tablet Oral Given 08/17/19 1819)  fluconazole (DIFLUCAN) tablet 150 mg (150 mg Oral Given 08/17/19 1915)     Initial Impression / Assessment and Plan / ED Course  I have reviewed the triage vital signs and the nursing notes.  Pertinent labs & imaging results that were available during my care of the patient were reviewed by me and considered in my medical decision making (see chart for details).        Patient presenting with lightheadedness/dizziness today upon standing.  He had no syncopal episode, palpitations or any other associated symptoms.  On arrival, vital signs are stable he is in no distress.  Labs and urine ordered.  He he is noted to have significant orthostatic hypotension on initial check from lying to sitting, this reproduced his symptoms.  He was given a liter of IV fluids.  Labs with WBC appears to be at baseline.  No anemia.  Creatinine also appears to be at patient's baseline.  His urine is consistent  with infection similar to previous results.  Culture sent.  Orthostatics rechecked after liter of fluids, he tolerated this well and they showed improvement.  He states he feels he is ready to go home.  Patient discussed with and evaluated by Dr. Stark Jock. will give another liter of fluids and treat for UTI with instruction to follow closely with PCP.  Patient is agreeable to this plan.\  Discussed results, findings, treatment and follow up. Patient advised of return precautions. Patient verbalized understanding and agreed with plan.  Final Clinical Impressions(s) / ED Diagnoses   Final diagnoses:  Recurrent UTI  Orthostatic hypotension    ED Discharge Orders         Ordered    sulfamethoxazole-trimethoprim (BACTRIM DS) 800-160 MG tablet  2 times daily     08/17/19 1848           Travaughn Vue, [place] N, PA-C 08/17/19 Lattie Corns    Veryl Speak, MD 08/17/19 2051

## 2019-08-17 NOTE — ED Notes (Signed)
Pt reports no dizziness upon standing despite orthostatic changes. Given graham crackers per RN.

## 2019-08-17 NOTE — Discharge Instructions (Addendum)
It is important that you stay hydrated and drink plenty of water. Starting tomorrow take the antibiotic, Bactrim, every 12 hours until gone. It is important that you follow-up closely with your primary care provider regarding your visit today. If you develop lightheadedness/dizziness again upon changing exertions, please return back to the emergency department for further evaluation.

## 2019-08-18 LAB — URINE CULTURE: Culture: <10000 — AB

## 2019-11-15 ENCOUNTER — Emergency Department (HOSPITAL_COMMUNITY)
Admission: EM | Admit: 2019-11-15 | Discharge: 2019-11-15 | Disposition: A | Discharge status: Home / Self Care | Payer: Medicare HMO | Attending: Emergency Medicine | Admitting: Emergency Medicine

## 2019-11-15 ENCOUNTER — Other Ambulatory Visit: Payer: Self-pay

## 2019-11-15 ENCOUNTER — Emergency Department (HOSPITAL_COMMUNITY): Payer: Medicare HMO

## 2019-11-15 DIAGNOSIS — Z7984 Long term (current) use of oral hypoglycemic drugs: Secondary | ICD-10-CM | POA: Diagnosis not present

## 2019-11-15 DIAGNOSIS — N390 Urinary tract infection, site not specified: Secondary | ICD-10-CM | POA: Diagnosis not present

## 2019-11-15 DIAGNOSIS — F17210 Nicotine dependence, cigarettes, uncomplicated: Secondary | ICD-10-CM | POA: Diagnosis not present

## 2019-11-15 DIAGNOSIS — E119 Type 2 diabetes mellitus without complications: Secondary | ICD-10-CM | POA: Insufficient documentation

## 2019-11-15 DIAGNOSIS — I110 Hypertensive heart disease with heart failure: Secondary | ICD-10-CM | POA: Insufficient documentation

## 2019-11-15 DIAGNOSIS — Z8673 Personal history of transient ischemic attack (TIA), and cerebral infarction without residual deficits: Secondary | ICD-10-CM | POA: Insufficient documentation

## 2019-11-15 DIAGNOSIS — Z7982 Long term (current) use of aspirin: Secondary | ICD-10-CM | POA: Insufficient documentation

## 2019-11-15 DIAGNOSIS — Z79899 Other long term (current) drug therapy: Secondary | ICD-10-CM | POA: Diagnosis not present

## 2019-11-15 DIAGNOSIS — I5032 Chronic diastolic (congestive) heart failure: Secondary | ICD-10-CM | POA: Insufficient documentation

## 2019-11-15 DIAGNOSIS — R1030 Lower abdominal pain, unspecified: Secondary | ICD-10-CM | POA: Diagnosis present

## 2019-11-15 DIAGNOSIS — R319 Hematuria, unspecified: Secondary | ICD-10-CM

## 2019-11-15 LAB — URINALYSIS, ROUTINE W REFLEX MICROSCOPIC
Bilirubin Urine: NEGATIVE
Glucose, UA: 50 mg/dL — AB
Ketones, ur: NEGATIVE mg/dL
Nitrite: NEGATIVE
Protein, ur: 100 mg/dL — AB
RBC / HPF: >50 RBC/hpf — ABNORMAL HIGH (ref 0–5)
Specific Gravity, Urine: 1.023 (ref 1.005–1.030)
WBC, UA: >50 WBC/hpf — ABNORMAL HIGH (ref 0–5)
pH: 8 (ref 5.0–8.0)

## 2019-11-15 LAB — CBC
HCT: 48.6 % (ref 39.0–52.0)
Hemoglobin: 15 g/dL (ref 13.0–17.0)
MCH: 23 pg — ABNORMAL LOW (ref 26.0–34.0)
MCHC: 30.9 g/dL (ref 30.0–36.0)
MCV: 74.7 fL — ABNORMAL LOW (ref 80.0–100.0)
Platelets: 312 10*3/uL (ref 150–400)
RBC: 6.51 MIL/uL — ABNORMAL HIGH (ref 4.22–5.81)
RDW: 16.6 % — ABNORMAL HIGH (ref 11.5–15.5)
WBC: 10.5 10*3/uL (ref 4.0–10.5)
nRBC: 0 % (ref 0.0–0.2)

## 2019-11-15 LAB — COMPREHENSIVE METABOLIC PANEL
ALT: 40 U/L (ref 0–44)
AST: 45 U/L — ABNORMAL HIGH (ref 15–41)
Albumin: 4 g/dL (ref 3.5–5.0)
Alkaline Phosphatase: 65 U/L (ref 38–126)
Anion gap: 13 (ref 5–15)
BUN: 23 mg/dL (ref 8–23)
CO2: 19 mmol/L — ABNORMAL LOW (ref 22–32)
Calcium: 9.5 mg/dL (ref 8.9–10.3)
Chloride: 103 mmol/L (ref 98–111)
Creatinine, Ser: 1.19 mg/dL (ref 0.61–1.24)
GFR calc Af Amer: >60 mL/min (ref >60)
GFR calc non Af Amer: >60 mL/min (ref >60)
Glucose, Bld: 185 mg/dL — ABNORMAL HIGH (ref 70–99)
Potassium: 4 mmol/L (ref 3.5–5.1)
Sodium: 135 mmol/L (ref 135–145)
Total Bilirubin: 0.8 mg/dL (ref 0.3–1.2)
Total Protein: 7.8 g/dL (ref 6.5–8.1)

## 2019-11-15 LAB — LIPASE, BLOOD: Lipase: 24 U/L (ref 11–51)

## 2019-11-15 MED ORDER — SODIUM CHLORIDE 0.9% FLUSH: 3.0000 mL | Freq: Once | INTRAVENOUS | Status: DC

## 2019-11-15 MED ORDER — IOHEXOL 300 MG/ML  SOLN
100.0000 mL | Freq: Once | INTRAMUSCULAR | Status: AC | PRN
  Administered 2019-11-15: 100 mL via INTRAVENOUS

## 2019-11-15 MED ORDER — CEPHALEXIN 500 MG PO CAPS: 500.0000 mg | ORAL_CAPSULE | Freq: Three times a day (TID) | ORAL | 0 refills | Status: AC

## 2019-11-15 MED ORDER — DIPHENHYDRAMINE HCL 50 MG/ML IJ SOLN: 25.0000 mg | Freq: Once | INTRAMUSCULAR | Status: DC

## 2019-11-15 MED ORDER — CEPHALEXIN 250 MG PO CAPS
500.0000 mg | ORAL_CAPSULE | Freq: Once | ORAL | Status: AC
  Administered 2019-11-15: 21:00:00 500 mg via ORAL
  Filled 2019-11-15: qty 2

## 2019-11-15 MED ORDER — ONDANSETRON 4 MG PO TBDP: 4.0000 mg | ORAL_TABLET | Freq: Three times a day (TID) | ORAL | 0 refills | Status: DC | PRN

## 2019-11-15 MED ORDER — MORPHINE SULFATE (PF) 4 MG/ML IV SOLN
4.0000 mg | Freq: Once | INTRAVENOUS | Status: AC
  Administered 2019-11-15: 18:00:00 4 mg via INTRAVENOUS
  Filled 2019-11-15: qty 1

## 2019-11-15 MED ORDER — OXYCODONE-ACETAMINOPHEN 5-325 MG PO TABS
1.0000 | ORAL_TABLET | Freq: Once | ORAL | Status: AC
  Administered 2019-11-15: 21:00:00 1 via ORAL
  Filled 2019-11-15: qty 1

## 2019-11-15 MED ORDER — ONDANSETRON HCL 4 MG/2ML IJ SOLN
4.0000 mg | Freq: Once | INTRAMUSCULAR | Status: AC
  Administered 2019-11-15: 18:00:00 4 mg via INTRAVENOUS
  Filled 2019-11-15: qty 2

## 2019-11-15 NOTE — ED Notes (Signed)
Patient educated about not driving or performing other critical tasks (such as operating heavy machinery, caring for infant/toddler/child) due to sedative nature of narcotic medications received while in the ED.  Pt/caregiver verbalized understanding.   

## 2019-11-15 NOTE — ED Notes (Signed)
Signature pad broken at time of discharge, pt verbalized understanding of discharge paper work.

## 2019-11-15 NOTE — ED Notes (Signed)
Pt reports that he just went to the restroom and urinated and did not get a urine sample. Reminded him that we are waiting on a urine sample at this time.

## 2019-11-15 NOTE — ED Provider Notes (Addendum)
[facility] EMERGENCY DEPARTMENT Provider Note   CSN: [id number] Arrival date & time: 11/15/19  1312     History   Chief Complaint Chief Complaint  Patient presents with   Abdominal Pain    HPI [name] is a 68 y.o. male.  Presents emergency complaint of abdominal pain.  Patient reports abdominal pain for the past week or so.  Reports that abdominal pain is worse in his lower abdomen, right and left side.  Currently 6 out of 10 in severity, dull, achy pain.  No alleviating or aggravating factors.  Has noted some pain with urination.  No urinary retention, incontinence.  No constipation or diarrhea.  Has been having regular bowel movements.  Has had some nausea but no vomiting.     HPI  Past Medical History:  Diagnosis Date   Alcohol dependence ([place]) 12/10/2013   Angioedema of lips 12/24/2013   Brain tumor ([place])    CHF (congestive heart failure) ([facility])    diastolic   Chronic back pain    Chronic headache    CVA (cerebral vascular accident) ([place])    Diabetes mellitus    HTN (hypertension), malignant 09/06/2012   Hypertension    Narcotic abuse ([place])    Normal cardiac stress test 2009   TIA (transient ischemic attack) 09/06/2012   Tobacco use disorder 12/10/2013   UTI (urinary tract infection)    Vertigo 09/06/2012    Patient Active Problem List   Diagnosis Date Noted   Syncope 01/23/2017   Elevated troponin    Nausea 11/01/2016   Dysuria 11/01/2016   Chronic diastolic heart failure ([place]) 03/20/2015   Chest pain 03/18/2015   History of TIA (transient ischemic attack) 01/27/2015   Enterococcus UTI 01/27/2015   Cough 01/27/2015   Bronchitis 01/27/2015   HCAP (healthcare-associated pneumonia)    CHF (congestive heart failure) ([place]) 01/26/2015   History of alcohol abuse 01/26/2015   Pyelonephritis 01/12/2015   Prostatitis 01/12/2015   Essential hypertension 11/03/2014   Hypokalemia 11/03/2014   Fever  08/15/2014   Chronic pain 12/24/2013   Tobacco use disorder 12/10/2013   Leukocytosis 12/10/2013   Diabetes mellitus type 2, controlled ([place]) 09/06/2012   Pain in the side 09/06/2012   Pituitary adenoma ([place]) 09/06/2012    Past Surgical History:  Procedure Laterality Date   NO PAST SURGERIES          Home Medications    Prior to Admission medications   Medication Sig Start Date End Date Taking? Authorizing Provider  acetaminophen (TYLENOL) 500 MG tablet Take 1 tablet (500 mg total) by mouth every 6 (six) hours as needed for mild pain. 06/16/18   Fawze, Mina A, PA-C  albuterol (PROVENTIL HFA;VENTOLIN HFA) 108 (90 Base) MCG/ACT inhaler Inhale 1-2 puffs into the lungs every 6 (six) hours as needed for wheezing. 09/04/16   Tanna Furry, MD  amLODipine (NORVASC) 10 MG tablet Take 10 mg by mouth daily.    [provider]  aspirin EC 81 MG tablet Take 81 mg by mouth daily.    [provider]  cephALEXin (KEFLEX) 500 MG capsule Take 1 capsule (500 mg total) by mouth 3 (three) times daily for 7 days. 11/15/19 11/22/19  Lucrezia Starch, MD  docusate sodium (COLACE) 100 MG capsule Take 1 capsule (100 mg total) by mouth every 12 (twelve) hours. Patient taking differently: Take 100 mg by mouth daily.  01/02/18   Little, Wenda Overland, MD  gabapentin (NEURONTIN) 100 MG capsule Take 100  mg by mouth 3 (three) times daily. 06/10/16   [provider]  hydrochlorothiazide (MICROZIDE) 12.5 MG capsule Take 12.5 mg by mouth daily. 12/09/17   [provider]  HYDROcodone-acetaminophen (NORCO) 5-325 MG tablet Take 1-2 tablets by mouth every 6 (six) hours as needed. Patient taking differently: Take 1-2 tablets by mouth every 6 (six) hours as needed for moderate pain.  12/27/17   Veryl Speak, MD  ibuprofen (ADVIL,MOTRIN) 800 MG tablet Take 0.5 tablets (400 mg total) by mouth every 6 (six) hours as needed (pain). 06/16/18   Fawze, Mina A, PA-C  isosorbide dinitrate  (ISORDIL) 10 MG tablet Take 1 tablet (10 mg total) by mouth 3 (three) times daily. 12/27/13   Debbe Odea, MD  metFORMIN (GLUCOPHAGE) 500 MG tablet Take 500 mg by mouth 2 (two) times daily with a meal.    [provider]  ondansetron (ZOFRAN ODT) 4 MG disintegrating tablet Take 1 tablet (4 mg total) by mouth every 8 (eight) hours as needed for nausea or vomiting. 11/15/19   Lucrezia Starch, MD  ondansetron (ZOFRAN) 4 MG tablet Take 1 tablet (4 mg total) by mouth every 6 (six) hours. Patient taking differently: Take 4 mg by mouth every 8 (eight) hours as needed for nausea.  03/20/16   Montine Circle, PA-C  oxyCODONE-acetaminophen (PERCOCET/ROXICET) 5-325 MG tablet Take 1 tablet by mouth every 8 (eight) hours as needed for moderate pain.  06/10/16   [provider]  pantoprazole (PROTONIX) 20 MG tablet Take 1 tablet (20 mg total) by mouth daily. 07/02/15   [name], MD  pravastatin (PRAVACHOL) 20 MG tablet Take 20 mg by mouth at bedtime.    [provider]  tamsulosin (FLOMAX) 0.4 MG CAPS capsule Take 1 capsule (0.4 mg total) by mouth daily. 07/18/16   Barrett, Lahoma Crocker, PA-C    Family History Family History  Problem Relation Age of Onset   CAD Father        "heart attack in his 35's"   Hypertension Father    Diabetes Brother    Diabetes Sister    Hypertension Mother     Social History Social History   Tobacco Use   Smoking status: Current Every Day Smoker    Packs/day: 0.50    Types: Cigarettes   Smokeless tobacco: Current User  Substance Use Topics   Alcohol use: Yes   Drug use: Yes    Types: Marijuana, Cocaine, IV    Comment: Heroin - one month ago     Allergies   Patient has no known allergies.   Review of Systems Review of Systems  Constitutional: Negative for chills and fever.  HENT: Negative for ear pain and sore throat.   Eyes: Negative for pain and visual disturbance.  Respiratory: Negative for cough and shortness of  breath.   Cardiovascular: Negative for chest pain and palpitations.  Gastrointestinal: Negative for abdominal pain and vomiting.  Genitourinary: Negative for dysuria and hematuria.  Musculoskeletal: Negative for arthralgias and back pain.  Skin: Negative for color change and rash.  Neurological: Negative for seizures and syncope.  All other systems reviewed and are negative.    Physical Exam Updated Vital Signs BP (!) 167/71    Pulse 76    Temp 97.9 F (36.6 C) (Oral)    Resp 16    SpO2 100%   Physical Exam Vitals signs and nursing note reviewed.  Constitutional:      Appearance: He is well-developed.  HENT:     Head:  Normocephalic and atraumatic.  Eyes:     Conjunctiva/sclera: Conjunctivae normal.  Neck:     Musculoskeletal: Neck supple.  Cardiovascular:     Rate and Rhythm: Normal rate and regular rhythm.     Heart sounds: No murmur.  Pulmonary:     Effort: Pulmonary effort is normal. No respiratory distress.     Breath sounds: Normal breath sounds.  Abdominal:     Palpations: Abdomen is soft.     Comments: Mild generalized TTP but overall soft abdomen with normal bs, no rebound or guarding  Skin:    General: Skin is warm and dry.  Neurological:     Mental Status: He is alert.      ED Treatments / Results  Labs (all labs ordered are listed, but only abnormal results are displayed) Labs Reviewed  COMPREHENSIVE METABOLIC PANEL - Abnormal; Notable for the following components:      Result Value   CO2 19 (*)    Glucose, Bld 185 (*)    AST 45 (*)    All other components within normal limits  CBC - Abnormal; Notable for the following components:   RBC 6.51 (*)    MCV 74.7 (*)    MCH 23.0 (*)    RDW 16.6 (*)    All other components within normal limits  URINALYSIS, ROUTINE W REFLEX MICROSCOPIC - Abnormal; Notable for the following components:   Color, Urine AMBER (*)    APPearance CLOUDY (*)    Glucose, UA 50 (*)    Hgb urine dipstick LARGE (*)    Protein,  ur 100 (*)    Leukocytes,Ua MODERATE (*)    RBC / HPF >50 (*)    WBC, UA >50 (*)    Bacteria, UA FEW (*)    All other components within normal limits  URINE CULTURE  LIPASE, BLOOD    EKG None  Radiology Ct Abdomen Pelvis W Contrast  Result Date: 11/15/2019 CLINICAL DATA:  Abdominal pain for 1 week. Clinical history includes alcohol abuse. EXAM: CT ABDOMEN AND PELVIS WITH CONTRAST TECHNIQUE: Multidetector CT imaging of the abdomen and pelvis was performed using the standard protocol following bolus administration of intravenous contrast. CONTRAST:  169mL OMNIPAQUE IOHEXOL 300 MG/ML  SOLN COMPARISON:  Stone study 12/07/2017 FINDINGS: Lower chest: Clear lung bases. Mild cardiomegaly, without pericardial or pleural effusion. Hepatobiliary: Mild hepatic steatosis. Lateral segment and caudate lobe prominence. Tiny lateral segment left liver lobe cysts. Normal gallbladder, without biliary ductal dilatation. Pancreas: Suspect pancreas divisum, with a prominent dorsal duct on 28/3. No acute inflammation or duct dilatation. Spleen: Normal in size, without focal abnormality. Adrenals/Urinary Tract: Normal adrenal glands. Normal kidneys, without hydronephrosis. Redemonstration of moderate bladder wall thickening, bladder distension, and multiple bladder diverticula. Morphology is relatively similar to 12/07/2017, with the wall thickening being slightly decreased, although this difference may be due to better distension today. Stomach/Bowel: Normal stomach, without wall thickening. Scattered colonic diverticula. Normal terminal ileum and appendix. Normal small bowel. Vascular/Lymphatic: Advanced aortic and branch vessel atherosclerosis. Prominent porta hepatis nodes are likely reactive. Example gastrohepatic ligament node of 1.3 cm. No pelvic sidewall adenopathy. Reproductive: Normal prostate. Other: No significant free fluid.  No free intraperitoneal air. Musculoskeletal: Degenerate disc disease at L3-4.  IMPRESSION: 1.  No acute process in the abdomen or pelvis. 2. Constellation of findings which are likely related to bladder outlet obstruction, grossly similar to 12/07/2017. Superimposed cystitis cannot be excluded. 3. Mild hepatic steatosis. Suspicion of mild cirrhosis in this patient with a  clinical history of alcohol abuse. 4. Probable pancreas divisum, without acute pancreatitis. 5.  Aortic Atherosclerosis (ICD10-I70.0). Electronically Signed   By: Abigail Miyamoto M.D.   On: 11/15/2019 18:15    Procedures Procedures (including critical care time)  Medications Ordered in ED Medications  sodium chloride flush (NS) 0.9 % injection 3 mL (3 mLs Intravenous Not Given 11/15/19 1701)  diphenhydrAMINE (BENADRYL) injection 25 mg (25 mg Intravenous Not Given 11/15/19 1831)  ondansetron (ZOFRAN) injection 4 mg (4 mg Intravenous Given 11/15/19 1744)  morphine 4 MG/ML injection 4 mg (4 mg Intravenous Given 11/15/19 1744)  iohexol (OMNIPAQUE) 300 MG/ML solution 100 mL (100 mLs Intravenous Contrast Given 11/15/19 1756)  cephALEXin (KEFLEX) capsule 500 mg (500 mg Oral Given 11/15/19 2117)  oxyCODONE-acetaminophen (PERCOCET/ROXICET) 5-325 MG per tablet 1 tablet (1 tablet Oral Given 11/15/19 2116)     Initial Impression / Assessment and Plan / ED Course  I have reviewed the triage vital signs and the nursing notes.  Pertinent labs & imaging results that were available during my care of the patient were reviewed by me and considered in my medical decision making (see chart for details).        68 year old male presents to ER with lower abdominal pain, dysuria.  On exam patient is noted to be well-appearing in no distress.  He has stable vital signs noted some tenderness on exam but no rebound or guarding.  CT scan was negative for acute abdominal or pelvic pathology, however urinalysis was concerning for urinary tract infection.  Patient has had frequent UTIs, has been evaluated by urology previously.  Will  start on course of cephalexin.  Recommend follow-up both with his primary doctor as well as with his urologist.  Tolerating p.o. and remains well-appearing, will discharge home.    After the discussed management above, the patient was determined to be safe for discharge.  The patient was in agreement with this plan and all questions regarding their care were answered.  ED return precautions were discussed and the patient will return to the ED with any significant worsening of condition.    Final Clinical Impressions(s) / ED Diagnoses   Final diagnoses:  Recurrent urinary tract infection  Hematuria, unspecified type    ED Discharge Orders         Ordered    cephALEXin (KEFLEX) 500 MG capsule  3 times daily     11/15/19 2102    ondansetron (ZOFRAN ODT) 4 MG disintegrating tablet  Every 8 hours PRN     11/15/19 2102           Lucrezia Starch, MD 11/15/19 2139    Lucrezia Starch, MD 11/26/19 1201

## 2019-11-15 NOTE — ED Triage Notes (Signed)
Pt here for evaluation of generalized abdominal pain and back pain x 1 week. Endorses nausea without vomiting and denies diarrhea.

## 2019-11-15 NOTE — ED Notes (Signed)
ED Provider at bedside. 

## 2019-11-15 NOTE — Discharge Instructions (Signed)
Please take antibiotic as prescribed for your urinary tract infection.  I recommend following up with both your primary care doctor as well as urology regarding your recurrent urinary tract infections.  If you develop fever, vomiting, other new concerning symptom recommend return to ER for reassessment.

## 2019-11-17 LAB — URINE CULTURE: Culture: NO GROWTH

## 2019-11-30 ENCOUNTER — Encounter: Payer: Self-pay | Admitting: Internal Medicine

## 2020-02-16 ENCOUNTER — Inpatient Hospital Stay (HOSPITAL_COMMUNITY)
Admission: EM | Admit: 2020-02-16 | Discharge: 2020-02-20 | DRG: 690 | Disposition: A | Discharge status: Home / Self Care | Payer: Medicare Other | Attending: Internal Medicine | Admitting: Internal Medicine

## 2020-02-16 ENCOUNTER — Encounter (HOSPITAL_COMMUNITY): Payer: Self-pay | Admitting: Emergency Medicine

## 2020-02-16 ENCOUNTER — Emergency Department (HOSPITAL_COMMUNITY): Payer: Medicare Other

## 2020-02-16 ENCOUNTER — Other Ambulatory Visit: Payer: Self-pay

## 2020-02-16 DIAGNOSIS — I421 Obstructive hypertrophic cardiomyopathy: Secondary | ICD-10-CM | POA: Diagnosis present

## 2020-02-16 DIAGNOSIS — I5032 Chronic diastolic (congestive) heart failure: Secondary | ICD-10-CM | POA: Diagnosis not present

## 2020-02-16 DIAGNOSIS — B9689 Other specified bacterial agents as the cause of diseases classified elsewhere: Secondary | ICD-10-CM | POA: Diagnosis not present

## 2020-02-16 DIAGNOSIS — D72829 Elevated white blood cell count, unspecified: Secondary | ICD-10-CM | POA: Diagnosis present

## 2020-02-16 DIAGNOSIS — E119 Type 2 diabetes mellitus without complications: Secondary | ICD-10-CM | POA: Diagnosis present

## 2020-02-16 DIAGNOSIS — R011 Cardiac murmur, unspecified: Secondary | ICD-10-CM | POA: Diagnosis not present

## 2020-02-16 DIAGNOSIS — E860 Dehydration: Secondary | ICD-10-CM | POA: Diagnosis present

## 2020-02-16 DIAGNOSIS — N320 Bladder-neck obstruction: Secondary | ICD-10-CM | POA: Diagnosis present

## 2020-02-16 DIAGNOSIS — G8929 Other chronic pain: Secondary | ICD-10-CM | POA: Diagnosis not present

## 2020-02-16 DIAGNOSIS — Z8673 Personal history of transient ischemic attack (TIA), and cerebral infarction without residual deficits: Secondary | ICD-10-CM

## 2020-02-16 DIAGNOSIS — N400 Enlarged prostate without lower urinary tract symptoms: Secondary | ICD-10-CM | POA: Diagnosis not present

## 2020-02-16 DIAGNOSIS — I110 Hypertensive heart disease with heart failure: Secondary | ICD-10-CM | POA: Diagnosis present

## 2020-02-16 DIAGNOSIS — F17210 Nicotine dependence, cigarettes, uncomplicated: Secondary | ICD-10-CM

## 2020-02-16 DIAGNOSIS — R1013 Epigastric pain: Secondary | ICD-10-CM

## 2020-02-16 DIAGNOSIS — I471 Supraventricular tachycardia: Secondary | ICD-10-CM | POA: Diagnosis not present

## 2020-02-16 DIAGNOSIS — Z87440 Personal history of urinary (tract) infections: Secondary | ICD-10-CM | POA: Diagnosis not present

## 2020-02-16 DIAGNOSIS — Z86018 Personal history of other benign neoplasm: Secondary | ICD-10-CM

## 2020-02-16 DIAGNOSIS — I160 Hypertensive urgency: Secondary | ICD-10-CM | POA: Diagnosis not present

## 2020-02-16 DIAGNOSIS — N390 Urinary tract infection, site not specified: Secondary | ICD-10-CM | POA: Diagnosis present

## 2020-02-16 DIAGNOSIS — N10 Acute tubulo-interstitial nephritis: Principal | ICD-10-CM | POA: Diagnosis present

## 2020-02-16 DIAGNOSIS — Z8249 Family history of ischemic heart disease and other diseases of the circulatory system: Secondary | ICD-10-CM

## 2020-02-16 DIAGNOSIS — I5033 Acute on chronic diastolic (congestive) heart failure: Secondary | ICD-10-CM | POA: Diagnosis present

## 2020-02-16 DIAGNOSIS — E876 Hypokalemia: Secondary | ICD-10-CM | POA: Diagnosis not present

## 2020-02-16 DIAGNOSIS — Z9114 Patient's other noncompliance with medication regimen: Secondary | ICD-10-CM

## 2020-02-16 DIAGNOSIS — E785 Hyperlipidemia, unspecified: Secondary | ICD-10-CM | POA: Diagnosis not present

## 2020-02-16 DIAGNOSIS — I10 Essential (primary) hypertension: Secondary | ICD-10-CM | POA: Diagnosis present

## 2020-02-16 DIAGNOSIS — Z79891 Long term (current) use of opiate analgesic: Secondary | ICD-10-CM

## 2020-02-16 DIAGNOSIS — F17200 Nicotine dependence, unspecified, uncomplicated: Secondary | ICD-10-CM | POA: Diagnosis present

## 2020-02-16 DIAGNOSIS — Z7984 Long term (current) use of oral hypoglycemic drugs: Secondary | ICD-10-CM

## 2020-02-16 DIAGNOSIS — N323 Diverticulum of bladder: Secondary | ICD-10-CM | POA: Diagnosis not present

## 2020-02-16 DIAGNOSIS — F1021 Alcohol dependence, in remission: Secondary | ICD-10-CM | POA: Diagnosis not present

## 2020-02-16 DIAGNOSIS — Z20822 Contact with and (suspected) exposure to covid-19: Secondary | ICD-10-CM | POA: Diagnosis not present

## 2020-02-16 DIAGNOSIS — Z7982 Long term (current) use of aspirin: Secondary | ICD-10-CM

## 2020-02-16 DIAGNOSIS — Z833 Family history of diabetes mellitus: Secondary | ICD-10-CM

## 2020-02-16 DIAGNOSIS — F1410 Cocaine abuse, uncomplicated: Secondary | ICD-10-CM | POA: Diagnosis not present

## 2020-02-16 DIAGNOSIS — K0889 Other specified disorders of teeth and supporting structures: Secondary | ICD-10-CM | POA: Diagnosis present

## 2020-02-16 DIAGNOSIS — Z79899 Other long term (current) drug therapy: Secondary | ICD-10-CM

## 2020-02-16 LAB — URINALYSIS, ROUTINE W REFLEX MICROSCOPIC
Bilirubin Urine: NEGATIVE
Glucose, UA: NEGATIVE mg/dL
Ketones, ur: NEGATIVE mg/dL
Nitrite: NEGATIVE
Protein, ur: 100 mg/dL — AB
Specific Gravity, Urine: 1.03 (ref 1.005–1.030)
WBC, UA: >50 WBC/hpf — ABNORMAL HIGH (ref 0–5)
pH: 8 (ref 5.0–8.0)

## 2020-02-16 LAB — COMPREHENSIVE METABOLIC PANEL
ALT: 26 U/L (ref 0–44)
AST: 27 U/L (ref 15–41)
Albumin: 4 g/dL (ref 3.5–5.0)
Alkaline Phosphatase: 62 U/L (ref 38–126)
Anion gap: 11 (ref 5–15)
BUN: 21 mg/dL (ref 8–23)
CO2: 20 mmol/L — ABNORMAL LOW (ref 22–32)
Calcium: 9.6 mg/dL (ref 8.9–10.3)
Chloride: 110 mmol/L (ref 98–111)
Creatinine, Ser: 1.04 mg/dL (ref 0.61–1.24)
GFR calc Af Amer: >60 mL/min (ref >60)
GFR calc non Af Amer: >60 mL/min (ref >60)
Glucose, Bld: 156 mg/dL — ABNORMAL HIGH (ref 70–99)
Potassium: 3.8 mmol/L (ref 3.5–5.1)
Sodium: 141 mmol/L (ref 135–145)
Total Bilirubin: 0.8 mg/dL (ref 0.3–1.2)
Total Protein: 8 g/dL (ref 6.5–8.1)

## 2020-02-16 LAB — RAPID URINE DRUG SCREEN, HOSP PERFORMED
Amphetamines: NOT DETECTED
Barbiturates: NOT DETECTED
Benzodiazepines: NOT DETECTED
Cocaine: POSITIVE — AB
Opiates: NOT DETECTED
Tetrahydrocannabinol: NOT DETECTED

## 2020-02-16 LAB — RESPIRATORY PANEL BY RT PCR (FLU A&B, COVID)
Influenza A by PCR: NEGATIVE
Influenza B by PCR: NEGATIVE
SARS Coronavirus 2 by RT PCR: NEGATIVE

## 2020-02-16 LAB — CBC
HCT: 51 % (ref 39.0–52.0)
Hemoglobin: 15.8 g/dL (ref 13.0–17.0)
MCH: 23 pg — ABNORMAL LOW (ref 26.0–34.0)
MCHC: 31 g/dL (ref 30.0–36.0)
MCV: 74.3 fL — ABNORMAL LOW (ref 80.0–100.0)
Platelets: 324 10*3/uL (ref 150–400)
RBC: 6.86 MIL/uL — ABNORMAL HIGH (ref 4.22–5.81)
RDW: 16.7 % — ABNORMAL HIGH (ref 11.5–15.5)
WBC: 12.2 10*3/uL — ABNORMAL HIGH (ref 4.0–10.5)
nRBC: 0 % (ref 0.0–0.2)

## 2020-02-16 LAB — LIPASE, BLOOD: Lipase: 23 U/L (ref 11–51)

## 2020-02-16 LAB — TROPONIN I (HIGH SENSITIVITY)
Troponin I (High Sensitivity): 15 ng/L (ref <18)
Troponin I (High Sensitivity): 21 ng/L — ABNORMAL HIGH (ref <18)
Troponin I (High Sensitivity): 20 ng/L — ABNORMAL HIGH (ref <18)

## 2020-02-16 LAB — CK: Total CK: 61 U/L (ref 49–397)

## 2020-02-16 LAB — CBG MONITORING, ED: Glucose-Capillary: 128 mg/dL — ABNORMAL HIGH (ref 70–99)

## 2020-02-16 LAB — ETHANOL: Alcohol, Ethyl (B): <10 mg/dL (ref <10)

## 2020-02-16 MED ORDER — ONDANSETRON HCL 4 MG PO TABS
4.0000 mg | ORAL_TABLET | Freq: Four times a day (QID) | ORAL | Status: DC | PRN
  Administered 2020-02-19: 4 mg via ORAL
  Filled 2020-02-16: qty 1

## 2020-02-16 MED ORDER — ALBUTEROL SULFATE HFA 108 (90 BASE) MCG/ACT IN AERS
1.0000 | INHALATION_SPRAY | Freq: Four times a day (QID) | RESPIRATORY_TRACT | Status: DC | PRN
  Filled 2020-02-16: qty 6.7

## 2020-02-16 MED ORDER — ISOSORBIDE DINITRATE 10 MG PO TABS
10.0000 mg | ORAL_TABLET | Freq: Three times a day (TID) | ORAL | Status: DC
  Administered 2020-02-16 – 2020-02-19 (×9): 10 mg via ORAL
  Filled 2020-02-16 (×11): qty 1

## 2020-02-16 MED ORDER — AMLODIPINE BESYLATE 10 MG PO TABS
10.0000 mg | ORAL_TABLET | Freq: Every day | ORAL | Status: DC
  Administered 2020-02-16 – 2020-02-20 (×5): 10 mg via ORAL
  Filled 2020-02-16: qty 2
  Filled 2020-02-16 (×2): qty 1
  Filled 2020-02-16: qty 2
  Filled 2020-02-16 (×2): qty 1

## 2020-02-16 MED ORDER — ONDANSETRON HCL 4 MG/2ML IJ SOLN
4.0000 mg | Freq: Once | INTRAMUSCULAR | Status: AC
  Administered 2020-02-16: 4 mg via INTRAVENOUS
  Filled 2020-02-16: qty 2

## 2020-02-16 MED ORDER — INSULIN ASPART 100 UNIT/ML ~~LOC~~ SOLN
0.0000 [IU] | SUBCUTANEOUS | Status: DC
  Administered 2020-02-16 – 2020-02-17 (×4): 2 [IU] via SUBCUTANEOUS
  Administered 2020-02-17: 13:00:00 3 [IU] via SUBCUTANEOUS
  Administered 2020-02-17 (×2): 2 [IU] via SUBCUTANEOUS
  Administered 2020-02-18: 5 [IU] via SUBCUTANEOUS
  Administered 2020-02-18 (×2): 3 [IU] via SUBCUTANEOUS
  Administered 2020-02-19: 8 [IU] via SUBCUTANEOUS
  Administered 2020-02-19: 21:00:00 2 [IU] via SUBCUTANEOUS
  Administered 2020-02-19: 14:00:00 3 [IU] via SUBCUTANEOUS
  Administered 2020-02-19: 5 [IU] via SUBCUTANEOUS
  Administered 2020-02-20: 09:00:00 8 [IU] via SUBCUTANEOUS

## 2020-02-16 MED ORDER — PANTOPRAZOLE SODIUM 20 MG PO TBEC
20.0000 mg | DELAYED_RELEASE_TABLET | Freq: Every day | ORAL | Status: DC
  Administered 2020-02-17: 20 mg via ORAL
  Filled 2020-02-16: qty 1

## 2020-02-16 MED ORDER — ACETAMINOPHEN 325 MG PO TABS: 650.0000 mg | ORAL_TABLET | Freq: Four times a day (QID) | ORAL | Status: DC | PRN

## 2020-02-16 MED ORDER — SODIUM CHLORIDE 0.9 % IV SOLN
1.0000 g | INTRAVENOUS | Status: DC
  Administered 2020-02-16 – 2020-02-19 (×4): 1 g via INTRAVENOUS
  Filled 2020-02-16 (×4): qty 10

## 2020-02-16 MED ORDER — HYDROMORPHONE HCL 1 MG/ML IJ SOLN
0.5000 mg | Freq: Once | INTRAMUSCULAR | Status: AC
  Administered 2020-02-16: 0.5 mg via INTRAVENOUS
  Filled 2020-02-16: qty 1

## 2020-02-16 MED ORDER — ASPIRIN 81 MG PO CHEW
324.0000 mg | CHEWABLE_TABLET | Freq: Once | ORAL | Status: AC
  Administered 2020-02-16: 324 mg via ORAL
  Filled 2020-02-16: qty 4

## 2020-02-16 MED ORDER — HYDRALAZINE HCL 50 MG PO TABS
50.0000 mg | ORAL_TABLET | Freq: Three times a day (TID) | ORAL | Status: DC
  Administered 2020-02-16 – 2020-02-18 (×6): 50 mg via ORAL
  Filled 2020-02-16 (×5): qty 1
  Filled 2020-02-16: qty 2

## 2020-02-16 MED ORDER — SODIUM CHLORIDE 0.9% FLUSH: 3.0000 mL | Freq: Once | INTRAVENOUS | Status: DC

## 2020-02-16 MED ORDER — ACETAMINOPHEN 650 MG RE SUPP: 650.0000 mg | Freq: Four times a day (QID) | RECTAL | Status: DC | PRN

## 2020-02-16 MED ORDER — HYDROCODONE-ACETAMINOPHEN 5-325 MG PO TABS
1.0000 | ORAL_TABLET | ORAL | Status: DC | PRN
  Administered 2020-02-17 (×3): 2 via ORAL
  Administered 2020-02-17: 18:00:00 1 via ORAL
  Administered 2020-02-18 – 2020-02-20 (×9): 2 via ORAL
  Filled 2020-02-16: qty 1
  Filled 2020-02-16: qty 2
  Filled 2020-02-16: qty 1
  Filled 2020-02-16 (×12): qty 2

## 2020-02-16 MED ORDER — HYDRALAZINE HCL 20 MG/ML IJ SOLN
10.0000 mg | INTRAMUSCULAR | Status: DC | PRN
  Administered 2020-02-16 (×2): 10 mg via INTRAVENOUS
  Filled 2020-02-16 (×2): qty 1

## 2020-02-16 MED ORDER — SUCRALFATE 1 GM/10ML PO SUSP
1.0000 g | Freq: Three times a day (TID) | ORAL | Status: DC
  Administered 2020-02-17 – 2020-02-20 (×15): 1 g via ORAL
  Filled 2020-02-16 (×17): qty 10

## 2020-02-16 MED ORDER — PRAVASTATIN SODIUM 10 MG PO TABS
20.0000 mg | ORAL_TABLET | Freq: Every day | ORAL | Status: DC
  Administered 2020-02-17 – 2020-02-19 (×4): 20 mg via ORAL
  Filled 2020-02-16 (×5): qty 2

## 2020-02-16 MED ORDER — GABAPENTIN 100 MG PO CAPS
100.0000 mg | ORAL_CAPSULE | Freq: Three times a day (TID) | ORAL | Status: DC
  Administered 2020-02-17 – 2020-02-20 (×12): 100 mg via ORAL
  Filled 2020-02-16 (×12): qty 1

## 2020-02-16 MED ORDER — LACTATED RINGERS IV BOLUS
500.0000 mL | Freq: Once | INTRAVENOUS | Status: AC
  Administered 2020-02-16: 500 mL via INTRAVENOUS

## 2020-02-16 MED ORDER — SODIUM CHLORIDE 0.9 % IV SOLN: INTRAVENOUS | Status: AC

## 2020-02-16 MED ORDER — PHENAZOPYRIDINE HCL 200 MG PO TABS
200.0000 mg | ORAL_TABLET | Freq: Three times a day (TID) | ORAL | Status: DC
  Administered 2020-02-17 – 2020-02-20 (×11): 200 mg via ORAL
  Filled 2020-02-16 (×13): qty 1

## 2020-02-16 MED ORDER — HYDROCHLOROTHIAZIDE 12.5 MG PO CAPS
12.5000 mg | ORAL_CAPSULE | Freq: Every day | ORAL | Status: DC
  Administered 2020-02-16 – 2020-02-20 (×5): 12.5 mg via ORAL
  Filled 2020-02-16 (×5): qty 1

## 2020-02-16 MED ORDER — ALUM & MAG HYDROXIDE-SIMETH 200-200-20 MG/5ML PO SUSP
30.0000 mL | Freq: Once | ORAL | Status: AC
  Administered 2020-02-16: 30 mL via ORAL
  Filled 2020-02-16: qty 30

## 2020-02-16 MED ORDER — ASPIRIN EC 81 MG PO TBEC
81.0000 mg | DELAYED_RELEASE_TABLET | Freq: Every day | ORAL | Status: DC
  Administered 2020-02-17 – 2020-02-20 (×4): 81 mg via ORAL
  Filled 2020-02-16 (×4): qty 1

## 2020-02-16 MED ORDER — LABETALOL HCL 5 MG/ML IV SOLN
10.0000 mg | Freq: Once | INTRAVENOUS | Status: AC
  Administered 2020-02-16: 10 mg via INTRAVENOUS
  Filled 2020-02-16: qty 4

## 2020-02-16 MED ORDER — ONDANSETRON HCL 4 MG/2ML IJ SOLN
4.0000 mg | Freq: Four times a day (QID) | INTRAMUSCULAR | Status: DC | PRN
  Administered 2020-02-18: 4 mg via INTRAVENOUS
  Filled 2020-02-16: qty 2

## 2020-02-16 MED ORDER — IOHEXOL 300 MG/ML  SOLN
100.0000 mL | Freq: Once | INTRAMUSCULAR | Status: AC | PRN
  Administered 2020-02-16: 100 mL via INTRAVENOUS

## 2020-02-16 MED ORDER — NICOTINE 21 MG/24HR TD PT24
21.0000 mg | MEDICATED_PATCH | Freq: Every day | TRANSDERMAL | Status: DC
  Administered 2020-02-16 – 2020-02-20 (×5): 21 mg via TRANSDERMAL
  Filled 2020-02-16 (×5): qty 1

## 2020-02-16 MED ORDER — ALBUTEROL SULFATE (2.5 MG/3ML) 0.083% IN NEBU: 3.0000 mL | INHALATION_SOLUTION | Freq: Four times a day (QID) | RESPIRATORY_TRACT | Status: DC | PRN

## 2020-02-16 NOTE — ED Triage Notes (Signed)
Pt complains of abd pain that started 2 days. Pt also has been vomiting. Denies diarrhea/constipation.

## 2020-02-16 NOTE — ED Provider Notes (Signed)
[facility] EMERGENCY DEPARTMENT Provider Note   CSN: [id number] Arrival date & time: 02/16/20  1440     History Chief Complaint  Patient presents with  . Abdominal Pain   [name] is a 69 y.o. male.  The history is provided by the patient and medical records.   The patient is a 69 year old male with a past medical history of heart failure, CVA, diabetes, hypertension, recurrent UTIs, alcohol dependence who presents to the ED for abdominal pain.  Reports the pain has been going on for 2 days, generalized but worse in the epigastric region, squeezing in nature, worse when laying on his belly, reports is better when he is not applying pressure to his abdomen, reports he is then nauseous and unable to tolerate any p.o. intake and has not been taking his medications due to vomiting.  No diarrhea, last bowel movement was earlier this morning and was normal.  He denies any dysuria, frequency or any other urinary symptoms at this time, denies any shortness of breath or chest pain, no fevers or chills.    Past Medical History:  Diagnosis Date  . Alcohol dependence ([place]) 12/10/2013  . Angioedema of lips 12/24/2013  . Brain tumor ([place])   . CHF (congestive heart failure) ([facility])    diastolic  . Chronic back pain   . Chronic headache   . CVA (cerebral vascular accident) ([place])   . Diabetes mellitus   . HTN (hypertension), malignant 09/06/2012  . Hypertension   . Narcotic abuse ([place])   . Normal cardiac stress test 2009  . TIA (transient ischemic attack) 09/06/2012  . Tobacco use disorder 12/10/2013  . UTI (urinary tract infection)   . Vertigo 09/06/2012    Patient Active Problem List   Diagnosis Date Noted  . Syncope 01/23/2017  . Elevated troponin   . Nausea 11/01/2016  . Dysuria 11/01/2016  . Chronic diastolic heart failure ([place]) 03/20/2015  . Chest pain 03/18/2015  . History of TIA (transient ischemic attack) 01/27/2015  . Enterococcus UTI 01/27/2015  . Cough  01/27/2015  . Bronchitis 01/27/2015  . HCAP (healthcare-associated pneumonia)   . CHF (congestive heart failure) ([place]) 01/26/2015  . History of alcohol abuse 01/26/2015  . Pyelonephritis 01/12/2015  . Prostatitis 01/12/2015  . Essential hypertension 11/03/2014  . Hypokalemia 11/03/2014  . Fever 08/15/2014  . Chronic pain 12/24/2013  . Tobacco use disorder 12/10/2013  . Leukocytosis 12/10/2013  . Diabetes mellitus type 2, controlled ([place]) 09/06/2012  . Pain in the side 09/06/2012  . Pituitary adenoma ([place]) 09/06/2012    Past Surgical History:  Procedure Laterality Date  . NO PAST SURGERIES         Family History  Problem Relation Age of Onset  . CAD Father        "heart attack in his 49's"  . Hypertension Father   . Diabetes Brother   . Diabetes Sister   . Hypertension Mother     Social History   Tobacco Use  . Smoking status: Current Every Day Smoker    Packs/day: 0.50    Types: Cigarettes  . Smokeless tobacco: Current User  Substance Use Topics  . Alcohol use: Yes  . Drug use: Yes    Types: Marijuana, Cocaine, IV    Comment: Heroin - one month ago    Home Medications Prior to Admission medications   Medication Sig Start Date End Date Taking? Authorizing Provider  acetaminophen (TYLENOL) 500 MG tablet Take 1 tablet (500 mg  total) by mouth every 6 (six) hours as needed for mild pain. 06/16/18   Fawze, Mina A, PA-C  albuterol (PROVENTIL HFA;VENTOLIN HFA) 108 (90 Base) MCG/ACT inhaler Inhale 1-2 puffs into the lungs every 6 (six) hours as needed for wheezing. 09/04/16   Tanna Furry, MD  amLODipine (NORVASC) 10 MG tablet Take 10 mg by mouth daily.    [provider]  aspirin EC 81 MG tablet Take 81 mg by mouth daily.    [provider]  docusate sodium (COLACE) 100 MG capsule Take 1 capsule (100 mg total) by mouth every 12 (twelve) hours. Patient taking differently: Take 100 mg by mouth daily.  01/02/18   Little, Wenda Overland, MD  gabapentin  (NEURONTIN) 100 MG capsule Take 100 mg by mouth 3 (three) times daily. 06/10/16   [provider]  hydrochlorothiazide (MICROZIDE) 12.5 MG capsule Take 12.5 mg by mouth daily. 12/09/17   [provider]  HYDROcodone-acetaminophen (NORCO) 5-325 MG tablet Take 1-2 tablets by mouth every 6 (six) hours as needed. Patient taking differently: Take 1-2 tablets by mouth every 6 (six) hours as needed for moderate pain.  12/27/17   Veryl Speak, MD  ibuprofen (ADVIL,MOTRIN) 800 MG tablet Take 0.5 tablets (400 mg total) by mouth every 6 (six) hours as needed (pain). 06/16/18   Fawze, Mina A, PA-C  isosorbide dinitrate (ISORDIL) 10 MG tablet Take 1 tablet (10 mg total) by mouth 3 (three) times daily. 12/27/13   Debbe Odea, MD  metFORMIN (GLUCOPHAGE) 500 MG tablet Take 500 mg by mouth 2 (two) times daily with a meal.    [provider]  ondansetron (ZOFRAN ODT) 4 MG disintegrating tablet Take 1 tablet (4 mg total) by mouth every 8 (eight) hours as needed for nausea or vomiting. 11/15/19   Lucrezia Starch, MD  ondansetron (ZOFRAN) 4 MG tablet Take 1 tablet (4 mg total) by mouth every 6 (six) hours. Patient taking differently: Take 4 mg by mouth every 8 (eight) hours as needed for nausea.  03/20/16   Montine Circle, PA-C  oxyCODONE-acetaminophen (PERCOCET/ROXICET) 5-325 MG tablet Take 1 tablet by mouth every 8 (eight) hours as needed for moderate pain.  06/10/16   [provider]  pantoprazole (PROTONIX) 20 MG tablet Take 1 tablet (20 mg total) by mouth daily. 07/02/15   [name], MD  pravastatin (PRAVACHOL) 20 MG tablet Take 20 mg by mouth at bedtime.    [provider]  tamsulosin (FLOMAX) 0.4 MG CAPS capsule Take 1 capsule (0.4 mg total) by mouth daily. 07/18/16   Barrett, Lahoma Crocker, PA-C    Allergies    Patient has no known allergies.  Review of Systems   Review of Systems  Constitutional: Negative for chills and fever.  Respiratory: Negative for cough  and shortness of breath.   Cardiovascular: Negative for chest pain.  Gastrointestinal: Positive for abdominal pain, nausea and vomiting. Negative for constipation and diarrhea.  Genitourinary: Negative for dysuria and frequency.  All other systems reviewed and are negative.   Physical Exam Updated Vital Signs BP (!) 202/68   Pulse 81   Temp 98.6 F (37 C) (Oral)   Resp 16   SpO2 100%   Physical Exam Vitals and nursing note reviewed.  Constitutional:      General: He is in acute distress (secondary to pain).     Appearance: He is well-developed.  HENT:     Head: Normocephalic and atraumatic.  Eyes:     Conjunctiva/sclera: Conjunctivae normal.  Cardiovascular:  Rate and Rhythm: Normal rate and regular rhythm.     Heart sounds: Murmur present.  Pulmonary:     Effort: Pulmonary effort is normal. No respiratory distress.     Breath sounds: Normal breath sounds.  Abdominal:     Palpations: Abdomen is soft.     Tenderness: There is generalized abdominal tenderness. There is no guarding or rebound. Negative signs include Murphy's sign, Rovsing's sign and McBurney's sign.  Genitourinary:    Testes:        Right: Swelling not present.        Left: Swelling not present.  Musculoskeletal:     Cervical back: Neck supple.  Skin:    General: Skin is warm and dry.  Neurological:     General: No focal deficit present.     Mental Status: He is alert.  Psychiatric:        Mood and Affect: Mood normal.     ED Results / Procedures / Treatments   Labs (all labs ordered are listed, but only abnormal results are displayed) Labs Reviewed  COMPREHENSIVE METABOLIC PANEL - Abnormal; Notable for the following components:      Result Value   CO2 20 (*)    Glucose, Bld 156 (*)    All other components within normal limits  CBC - Abnormal; Notable for the following components:   WBC 12.2 (*)    RBC 6.86 (*)    MCV 74.3 (*)    MCH 23.0 (*)    RDW 16.7 (*)    All other components  within normal limits  URINALYSIS, ROUTINE W REFLEX MICROSCOPIC - Abnormal; Notable for the following components:   Color, Urine AMBER (*)    APPearance CLOUDY (*)    Hgb urine dipstick SMALL (*)    Protein, ur 100 (*)    Leukocytes,Ua TRACE (*)    WBC, UA >50 (*)    Bacteria, UA MANY (*)    Non Squamous Epithelial 0-5 (*)    All other components within normal limits  RAPID URINE DRUG SCREEN, HOSP PERFORMED - Abnormal; Notable for the following components:   Cocaine POSITIVE (*)    All other components within normal limits  CBG MONITORING, ED - Abnormal; Notable for the following components:   Glucose-Capillary 128 (*)    All other components within normal limits  TROPONIN I (HIGH SENSITIVITY) - Abnormal; Notable for the following components:   Troponin I (High Sensitivity) 21 (*)    All other components within normal limits  TROPONIN I (HIGH SENSITIVITY) - Abnormal; Notable for the following components:   Troponin I (High Sensitivity) 20 (*)    All other components within normal limits  RESPIRATORY PANEL BY RT PCR (FLU A&B, COVID)  URINE CULTURE  LIPASE, BLOOD  ETHANOL  CK  HEMOGLOBIN A1C  HIV ANTIBODY (ROUTINE TESTING W REFLEX)  MAGNESIUM  PHOSPHORUS  TSH  COMPREHENSIVE METABOLIC PANEL  CBC  TROPONIN I (HIGH SENSITIVITY)  TROPONIN I (HIGH SENSITIVITY)    EKG EKG Interpretation  Date/Time:  Thursday February 16 2020 15:35:07 EST Ventricular Rate:  54 PR Interval:    QRS Duration: 83 QT Interval:  429 QTC Calculation: 407 R Axis:   7 Text Interpretation: Sinus rhythm Biatrial enlargement Probable anteroseptal infarct, recent Baseline wander in lead(s) V3 biphasic T waves V4-6 new since Aug 2020 Confirmed by Sherwood Gambler [phone]) on 02/16/2020 3:59:14 PM   Radiology CT ABDOMEN PELVIS W CONTRAST  Result Date: 02/16/2020 CLINICAL DATA:  69 year old male with abdominal  pain, nausea vomiting. EXAM: CT ABDOMEN AND PELVIS WITH CONTRAST TECHNIQUE: Multidetector CT  imaging of the abdomen and pelvis was performed using the standard protocol following bolus administration of intravenous contrast. CONTRAST:  112mL OMNIPAQUE IOHEXOL 300 MG/ML  SOLN COMPARISON:  CT abdomen pelvis dated 11/15/2019. FINDINGS: Lower chest: The visualized lung bases are clear. There is coronary vascular calcification primarily involving the left circumflex artery. No intra-abdominal free air or free fluid. Hepatobiliary: Small left hepatic hypodense lesions are suboptimally characterized but appears similar to prior CT, likely cysts or hemangioma. There is apparent background of fatty infiltration of the liver. No intrahepatic biliary ductal dilatation. No calcified gallstone or pericholecystic fluid. Pancreas: Unremarkable. No pancreatic ductal dilatation or surrounding inflammatory changes. Spleen: Normal in size without focal abnormality. Adrenals/Urinary Tract: The adrenal glands are unremarkable. There is no hydronephrosis on either side. There is symmetric enhancement and excretion of contrast by both kidneys. The visualized ureters appear unremarkable. There is diffuse thickened bladder wall with large bladder diverticula. Findings likely combination of chronic bladder outlet obstruction and infection. Correlation with urinalysis recommended to evaluate for acute cystitis. Stomach/Bowel: There is extensive sigmoid diverticulosis and scattered colonic diverticula without active inflammatory changes. There is loss of fat plane between the sigmoid colon and bladder wall suggestive of adhesion. This can predispose to a colovesical fistula. There is no bowel obstruction or active inflammation. The appendix is normal. Vascular/Lymphatic: Moderate aortoiliac atherosclerotic disease. The IVC is unremarkable. No portal venous gas. There is no adenopathy. Reproductive: The prostate and seminal vesicles are grossly unremarkable. Other: None Musculoskeletal: No acute or significant osseous findings.  IMPRESSION: 1. No acute intra-abdominal or pelvic pathology. Extensive colonic diverticulosis. No bowel obstruction or active inflammation. Normal appendix. 2. Diffuse bladder wall thickening and several bladder diverticula. Findings likely represent combination of chronic infection and bladder outlet obstruction. Correlation with urinalysis recommended to exclude active cystitis. 3. Loss of fat plane between sigmoid colon and bladder wall suggestive of adhesion. This can predispose to a colovesical fistula. 4. Fatty liver. 5. Moderate Aortic Atherosclerosis (ICD10-I70.0). Electronically Signed   By: Anner Crete M.D.   On: 02/16/2020 18:22    Procedures Procedures (including critical care time)  Medications Ordered in ED P.o. aspirin, 0.5 mg IV Dilaudid, 500 cc LR bolus, 4 mg IV Zofran ED Course  I have reviewed the triage vital signs and the nursing notes.  Pertinent labs & imaging results that were available during my care of the patient were reviewed by me and considered in my medical decision making (see chart for details).    MDM Rules/Calculators/A&P                      69 year old male who presents for abdominal pain nausea and vomiting.  Differential includes pancreatitis, gastritis, cholecystitis, ACS, bowel obstruction.  CT negative for acute changes.  UA pending.   EKG obtained as screening for ACS although the patient denies any chest pain or shortness of breath.  EKG with new biphasic T waves in V4 through V6, reassess the patient who reports that his pain is worse with exertion and that he has worsening nausea and vomiting with exertion.  With these new EKG findings and exertional epigastric pain troponins were ordered and initial troponin negative, second troponin mildly elevated.  History and testing results concerning for possible anginal equivalent, discussed with Dr. Roel Cluck with hospital medicine who will admit for observation.    Final Clinical Impression(s) / ED  Diagnoses Final diagnoses:  Epigastric abdominal pain    Rx / DC Orders ED Discharge Orders    None       Yakir Wenke, [place], MD 02/16/20 [id number]    Sherwood Gambler, MD 02/17/20 1733

## 2020-02-16 NOTE — H&P (Signed)
[name] [id number] DOB: Oct 22, 1951 DOA: 02/16/2020     PCP: Nolene Ebbs, MD   Outpatient Specialists:  NONE    Patient arrived to ER on 02/16/20 at 1440  Patient coming from: home Lives alone,     Chief Complaint:  Chief Complaint  Patient presents with  . Abdominal Pain    HPI: [name] is a 69 y.o. male with medical history significant of hypertension, diabetes Chronic diastolic CHF, CVA, recurrent UTI, EtOH Abuse  Presented with   2-day history of abdominal pain nausea and vomiting, describes pain is epigastric squeezing worse when he applies any pressure to it.  He is unable to tolerate any p.o. intake.  Has not been able to take his medications because it causes him vomiting.  No associated diarrhea.  Last normal BM was today. No associated shortness of breath no fevers or chills  Reports abdominal pain in the lower abdomen as well.  Reports after he took Aleve his abdomen started to hurt worse Reports severe dysuria  He usually takes medications with food have not had BP meds for the past 3 days  Pt has frequent UTi and have seen urologist in the past No CP, SOB,   Continues to smoke interested in quitting and requesting nicotine patch  Infectious risk factors:  Reports chills,  URI symptoms,   N/V abdominal pain,  Body aches, severe fatigue    In  ER   COVID TEST  NEGATIVE   Lab Results  Component Value Date   [place] NEGATIVE 02/16/2020     Regarding pertinent Chronic problems:    Hyperlipidemia -   on statins Pravachol   HTN on Norvasc hydrochlorothiazide Isordil   chronic CHF diastolic/systolic/ combined - last echo 2018 LV EF: 65% -  70%  (grade 1 diastolic dysfunction).     DM 2 -  Lab Results  Component Value Date   HGBA1C 6.2 (H) 11/01/2016    PO meds only       Hx of CVA -  with/out residual deficits on the left side Aspirin 81 mg       BPH - on Flomax,        While in ER:  Ct abd unremarkable     The following Work up has been ordered so far:  Orders Placed This Encounter  Procedures  . Respiratory Panel by RT PCR (Flu A&B, Covid) - Nasopharyngeal Swab  . CT ABDOMEN PELVIS W CONTRAST  . Lipase, blood  . Comprehensive metabolic panel  . CBC  . Urinalysis, Routine w reflex microscopic  . Diet NPO time specified  . Saline Lock IV, Maintain IV access  . Consult to hospitalist  ALL PATIENTS BEING ADMITTED/HAVING PROCEDURES NEED COVID-19 SCREENING  . ED EKG  . EKG 12-Lead     Following Medications were ordered in ER: Medications  sodium chloride flush (NS) 0.9 % injection 3 mL (3 mLs Intravenous Not Given 02/16/20 1639)  amLODipine (NORVASC) tablet 10 mg (10 mg Oral Given 02/16/20 1640)  hydrochlorothiazide (MICROZIDE) capsule 12.5 mg (12.5 mg Oral Given 02/16/20 1640)  isosorbide dinitrate (ISORDIL) tablet 10 mg (10 mg Oral Given 02/16/20 1658)  lactated ringers bolus 500 mL (500 mLs Intravenous New Bag/Given 02/16/20 1637)  ondansetron (ZOFRAN) injection 4 mg (4 mg Intravenous Given 02/16/20 1639)  HYDROmorphone (DILAUDID) injection 0.5 mg (0.5 mg Intravenous Given 02/16/20 1637)  aspirin chewable tablet 324 mg (324 mg Oral Given 02/16/20 1640)  iohexol (OMNIPAQUE) 300 MG/ML solution  100 mL (100 mLs Intravenous Contrast Given 02/16/20 1738)        Consult Orders  (From admission, onward)         Start     Ordered   02/16/20 1843  Consult to hospitalist  ALL PATIENTS BEING ADMITTED/HAVING PROCEDURES NEED COVID-19 SCREENING  Once    Comments: ALL PATIENTS BEING ADMITTED/HAVING PROCEDURES NEED COVID-19 SCREENING  Provider:  (Not yet assigned)  Question Answer Comment  Place call to: [facility]   Reason for Consult Admit      02/16/20 1842          Significant initial  Findings: Abnormal Labs Reviewed  COMPREHENSIVE METABOLIC PANEL - Abnormal; Notable for the following components:      Result Value   CO2 20 (*)    Glucose, Bld 156 (*)    All other components  within normal limits  CBC - Abnormal; Notable for the following components:   WBC 12.2 (*)    RBC 6.86 (*)    MCV 74.3 (*)    MCH 23.0 (*)    RDW 16.7 (*)    All other components within normal limits    Otherwise labs showing:    Recent Labs  Lab 02/16/20 1455  NA 141  K 3.8  CO2 20*  GLUCOSE 156*  BUN 21  CREATININE 1.04  CALCIUM 9.6    Cr  stable,    Lab Results  Component Value Date   CREATININE 1.04 02/16/2020   CREATININE 1.19 11/15/2019   CREATININE 1.52 (H) 08/17/2019    Recent Labs  Lab 02/16/20 1455  AST 27  ALT 26  ALKPHOS 62  BILITOT 0.8  PROT 8.0  ALBUMIN 4.0   Lab Results  Component Value Date   CALCIUM 9.6 02/16/2020      WBC      Component Value Date/Time   WBC 12.2 (H) 02/16/2020 1455   ANC    Component Value Date/Time   NEUTROABS 7.5 08/17/2019 1410   ALC No components found for: LYMPHAB    Plt: Lab Results  Component Value Date   PLT 324 02/16/2020   HG/HCT  stable,      Component Value Date/Time   HGB 15.8 02/16/2020 1455   HCT 51.0 02/16/2020 1455    Recent Labs  Lab 02/16/20 1455  LIPASE 23     Troponin 14 - 20 Cardiac Panel (last 3 results) Recent Labs    02/16/20 1922  CKTOTAL 61       ECG: Ordered Personally reviewed by me showing: HR : 54 Rhythm:   Sinus bradycardia     Non specific QTC 407     UA  not ordered   Ordered    CTabd/pelvis -  nonacute evidence of urinary bladder abnormality.  No evidence of perinephric abscess    ED Triage Vitals  Enc Vitals Group     BP 02/16/20 1443 (!) 202/68     Pulse Rate 02/16/20 1443 81     Resp 02/16/20 1443 16     Temp 02/16/20 1443 98.6 F (37 C)     Temp Source 02/16/20 1443 Oral     SpO2 02/16/20 1443 100 %     Weight --      Height --      Head Circumference --      Peak Flow --      Pain Score 02/16/20 1449 10     Pain Loc --      Pain  Edu? --      Excl. in [place]? --   TMAX(24)@       Latest  Blood pressure (!) 231/53, pulse 81,  temperature 98.6 F (37 C), temperature source Oral, resp. rate (!) 24, SpO2 100 %.     Hospitalist was called for admission for pyelonephritis resulting in nausea vomiting dehydration and accelerated hypertension due to medication noncompliance    Review of Systems:    Pertinent positives include:  abdominal pain, nausea, vomiting,  Constitutional:  No weight loss, night sweats, Fevers, chills, fatigue, weight loss  HEENT:  No headaches, Difficulty swallowing,Tooth/dental problems,Sore throat,  No sneezing, itching, ear ache, nasal congestion, post nasal drip,  Cardio-vascular:  No chest pain, Orthopnea, PND, anasarca, dizziness, palpitations.no Bilateral lower extremity swelling  GI:  No heartburn, indigestion diarrhea, change in bowel habits, loss of appetite, melena, blood in stool, hematemesis Resp:  no shortness of breath at rest. No dyspnea on exertion, No excess mucus, no productive cough, No non-productive cough, No coughing up of blood.No change in color of mucus.No wheezing. Skin:  no rash or lesions. No jaundice GU:  no dysuria, change in color of urine, no urgency or frequency. No straining to urinate.  No flank pain.  Musculoskeletal:  No joint pain or no joint swelling. No decreased range of motion. No back pain.  Psych:  No change in mood or affect. No depression or anxiety. No memory loss.  Neuro: no localizing neurological complaints, no tingling, no weakness, no double vision, no gait abnormality, no slurred speech, no confusion  All systems reviewed and apart from [place] all are negative  Past Medical History:   Past Medical History:  Diagnosis Date  . Alcohol dependence ([place]) 12/10/2013  . Angioedema of lips 12/24/2013  . Brain tumor ([place])   . CHF (congestive heart failure) (HCC)    diastolic  . Chronic back pain   . Chronic headache   . CVA (cerebral vascular accident) ([place])   . Diabetes mellitus   . HTN (hypertension), malignant 09/06/2012  .  Hypertension   . Narcotic abuse ([place])   . Normal cardiac stress test 2009  . TIA (transient ischemic attack) 09/06/2012  . Tobacco use disorder 12/10/2013  . UTI (urinary tract infection)   . Vertigo 09/06/2012     Past Surgical History:  Procedure Laterality Date  . NO PAST SURGERIES      Social History:  Ambulatory   independently       reports that he has been smoking cigarettes. He has been smoking about 0.50 packs per day. He uses smokeless tobacco. He reports current alcohol use. He reports current drug use. Drugs: Marijuana, Cocaine, and IV.   Family History:   Family History  Problem Relation Age of Onset  . CAD Father        "heart attack in his 59's"  . Hypertension Father   . Diabetes Brother   . Diabetes Sister   . Hypertension Mother     Allergies: No Known Allergies   Prior to Admission medications   Medication Sig Start Date End Date Taking? Authorizing Provider  acetaminophen (TYLENOL) 500 MG tablet Take 1 tablet (500 mg total) by mouth every 6 (six) hours as needed for mild pain. 06/16/18   Fawze, Mina A, PA-C  albuterol (PROVENTIL HFA;VENTOLIN HFA) 108 (90 Base) MCG/ACT inhaler Inhale 1-2 puffs into the lungs every 6 (six) hours as needed for wheezing. 09/04/16   Tanna Furry, MD  amLODipine (NORVASC) 10 MG tablet  Take 10 mg by mouth daily.    [provider]  aspirin EC 81 MG tablet Take 81 mg by mouth daily.    [provider]  docusate sodium (COLACE) 100 MG capsule Take 1 capsule (100 mg total) by mouth every 12 (twelve) hours. Patient taking differently: Take 100 mg by mouth daily.  01/02/18   Little, Wenda Overland, MD  gabapentin (NEURONTIN) 100 MG capsule Take 100 mg by mouth 3 (three) times daily. 06/10/16   [provider]  hydrochlorothiazide (MICROZIDE) 12.5 MG capsule Take 12.5 mg by mouth daily. 12/09/17   [provider]  HYDROcodone-acetaminophen (NORCO) 5-325 MG tablet Take 1-2 tablets by mouth every 6 (six)  hours as needed. Patient taking differently: Take 1-2 tablets by mouth every 6 (six) hours as needed for moderate pain.  12/27/17   Veryl Speak, MD  ibuprofen (ADVIL,MOTRIN) 800 MG tablet Take 0.5 tablets (400 mg total) by mouth every 6 (six) hours as needed (pain). 06/16/18   Fawze, Mina A, PA-C  isosorbide dinitrate (ISORDIL) 10 MG tablet Take 1 tablet (10 mg total) by mouth 3 (three) times daily. 12/27/13   Debbe Odea, MD  metFORMIN (GLUCOPHAGE) 500 MG tablet Take 500 mg by mouth 2 (two) times daily with a meal.    [provider]  ondansetron (ZOFRAN ODT) 4 MG disintegrating tablet Take 1 tablet (4 mg total) by mouth every 8 (eight) hours as needed for nausea or vomiting. 11/15/19   Lucrezia Starch, MD  ondansetron (ZOFRAN) 4 MG tablet Take 1 tablet (4 mg total) by mouth every 6 (six) hours. Patient taking differently: Take 4 mg by mouth every 8 (eight) hours as needed for nausea.  03/20/16   Montine Circle, PA-C  oxyCODONE-acetaminophen (PERCOCET/ROXICET) 5-325 MG tablet Take 1 tablet by mouth every 8 (eight) hours as needed for moderate pain.  06/10/16   [provider]  pantoprazole (PROTONIX) 20 MG tablet Take 1 tablet (20 mg total) by mouth daily. 07/02/15   [name], MD  pravastatin (PRAVACHOL) 20 MG tablet Take 20 mg by mouth at bedtime.    [provider]  tamsulosin (FLOMAX) 0.4 MG CAPS capsule Take 1 capsule (0.4 mg total) by mouth daily. 07/18/16   Barrett, Lahoma Crocker, PA-C   Physical Exam: Blood pressure (!) 231/53, pulse 81, temperature 98.6 F (37 C), temperature source Oral, resp. rate (!) 24, SpO2 100 %. 1. General:  in No  Acute distress    Chronically ill -appearing 2. Psychological:  Oriented 3. Head/ENT:    Dry Mucous Membranes                          Head Non traumatic, neck supple                           Poor Dentition 4. SKIN: decreased Skin turgor,  Skin clean Dry and intact no rash 5. Heart: Regular rate and rhythm no Murmur,  no Rub or gallop 6. Lungs:   no wheezes or crackles   7. Abdomen: Soft,  non-tender, Non distended  bowel sounds present 8. Lower extremities: no clubbing, cyanosis, no  edema 9. Neurologically Grossly intact, moving all 4 extremities equally  10. MSK: Normal range of motion   All other LABS:     Recent Labs  Lab 02/16/20 1455  WBC 12.2*  HGB 15.8  HCT 51.0  MCV 74.3*  PLT 324  Recent Labs  Lab 02/16/20 1455  NA 141  K 3.8  CL 110  CO2 20*  GLUCOSE 156*  BUN 21  CREATININE 1.04  CALCIUM 9.6     Recent Labs  Lab 02/16/20 1455  AST 27  ALT 26  ALKPHOS 62  BILITOT 0.8  PROT 8.0  ALBUMIN 4.0    Cultures:    Component Value Date/Time   SDES URINE, RANDOM 11/15/2019 1959   [place] NONE 11/15/2019 1959   CULT  11/15/2019 1959    NO GROWTH Performed at [facility], [place] [address], [place],  [address]    REPTSTATUS 11/17/2019 FINAL 11/15/2019 1959     Radiological Exams on Admission: CT ABDOMEN PELVIS W CONTRAST  Result Date: 02/16/2020 CLINICAL DATA:  69 year old male with abdominal pain, nausea vomiting. EXAM: CT ABDOMEN AND PELVIS WITH CONTRAST TECHNIQUE: Multidetector CT imaging of the abdomen and pelvis was performed using the standard protocol following bolus administration of intravenous contrast. CONTRAST:  152mL OMNIPAQUE IOHEXOL 300 MG/ML  SOLN COMPARISON:  CT abdomen pelvis dated 11/15/2019. FINDINGS: Lower chest: The visualized lung bases are clear. There is coronary vascular calcification primarily involving the left circumflex artery. No intra-abdominal free air or free fluid. Hepatobiliary: Small left hepatic hypodense lesions are suboptimally characterized but appears similar to prior CT, likely cysts or hemangioma. There is apparent background of fatty infiltration of the liver. No intrahepatic biliary ductal dilatation. No calcified gallstone or pericholecystic fluid. Pancreas: Unremarkable. No pancreatic ductal dilatation or  surrounding inflammatory changes. Spleen: Normal in size without focal abnormality. Adrenals/Urinary Tract: The adrenal glands are unremarkable. There is no hydronephrosis on either side. There is symmetric enhancement and excretion of contrast by both kidneys. The visualized ureters appear unremarkable. There is diffuse thickened bladder wall with large bladder diverticula. Findings likely combination of chronic bladder outlet obstruction and infection. Correlation with urinalysis recommended to evaluate for acute cystitis. Stomach/Bowel: There is extensive sigmoid diverticulosis and scattered colonic diverticula without active inflammatory changes. There is loss of fat plane between the sigmoid colon and bladder wall suggestive of adhesion. This can predispose to a colovesical fistula. There is no bowel obstruction or active inflammation. The appendix is normal. Vascular/Lymphatic: Moderate aortoiliac atherosclerotic disease. The IVC is unremarkable. No portal venous gas. There is no adenopathy. Reproductive: The prostate and seminal vesicles are grossly unremarkable. Other: None Musculoskeletal: No acute or significant osseous findings. IMPRESSION: 1. No acute intra-abdominal or pelvic pathology. Extensive colonic diverticulosis. No bowel obstruction or active inflammation. Normal appendix. 2. Diffuse bladder wall thickening and several bladder diverticula. Findings likely represent combination of chronic infection and bladder outlet obstruction. Correlation with urinalysis recommended to exclude active cystitis. 3. Loss of fat plane between sigmoid colon and bladder wall suggestive of adhesion. This can predispose to a colovesical fistula. 4. Fatty liver. 5. Moderate Aortic Atherosclerosis (ICD10-I70.0). Electronically Signed   By: Anner Crete M.D.   On: 02/16/2020 18:22    Chart has been reviewed    Assessment/Plan  69 y.o. male with medical history significant of hypertension, diabetes Chronic  diastolic CHF, CVA, recurrent UTI, EtOH Abuse  Admitted for  for pyelonephritis resulting in nausea vomiting dehydration and accelerated hypertension due to medication noncompliance    Present on Admission: Pyelonephritis -evidence of UTI with nausea vomiting chills leukocytosis CT scan showing no evidence of perinephric abscess, the past urine culture no significant growth patient states he has been followed by urology they give him some medications with his symptoms come back.  Recommend reevaluation given recurrence dysuria.  Obtain urine culture for now cover Rocephin await results Rehydrate  . Hypertensive urgency in the setting medication noncompliance secondary to nausea and vomiting patient states at baseline his blood pressure runs in 170s despite being on 3 blood pressure medications.  Was likely need close follow-up and improvement of blood pressure management as an outpatient  . Chronic pain -chronic stable  . Chronic diastolic heart failure (HCC) appears to be currently at baseline continue to monitor Gently rehydrate and follow fluid status  . Leukocytosis in the setting of likely pyelonephritis  . Essential hypertension -restart home medications  . Epigastric pain -worse after taking Aleve will give Carafate and GI cocktail.  History of alcohol abuse.  Patient states has not been drinking for the past 6 years appears to be slightly tremulous.  Obtain alcohol level monitor for any other signs of withdrawal  Tobacco abuse - - Spoke about importance of quitting spent 5 minutes discussing options for treatment, prior attempts at quitting, and dangers of smoking  -At this point patient is   interested in quitting  - order nicotine patch   - nursing tobacco cessation protocol  Abnormal ECG in ER noted to have ECG changes no chest pain, trop wnl cont to monitor, echo in AM   Cardiac murmur -order echogram  DM2 -  - Order Sensitive  SSI   -  check TSH and HgA1C  - Hold by  mouth medications     Other plan as per orders.  DVT prophylaxis:   Lovenox     Code Status:  FULL CODE  as per patient   I had personally discussed CODE STATUS with patient   Family Communication:   Family not at  Bedside    Disposition Plan:      To home once workup is complete and patient is stable                       Consults called: none  Admission status:  ED Disposition    ED Disposition Condition [place]: [place] [[id number]]  Level of Care: Progressive [102]  Admit to Progressive based on following criteria: CARDIOVASCULAR & THORACIC of moderate stability with acute coronary syndrome symptoms/low risk myocardial infarction/hypertensive urgency/arrhythmias/heart failure potentially compromising stability and stable post cardiovascular intervention patients.  Covid Evaluation: Symptomatic Person Under Investigation (PUI)  Diagnosis: Hypertensive urgency [id number]  Admitting Physician: Toy Baker [[id number]  Attending Physician: Toy Baker [[id number]]       Obs     Level of care    SDU tele indefinitely please discontinue once patient no longer qualifies   Precautions: admitted as  PUI   No active isolations  If Covid PCR is negative  - please DC precautions   PPE: Used by the provider:   P100  eye Goggles,  Gloves     Landi Biscardi 02/16/2020, 9:50 PM    [facility]     after 2 AM please page floor coverage PA If 7AM-7PM, please contact the day team taking care of the patient using [email or web address]   Patient was evaluated in the context of the global COVID-19 pandemic, which necessitated consideration that the patient might be at risk for infection with the SARS-CoV-2 virus that causes COVID-19. Institutional protocols and algorithms that pertain to the evaluation of patients at risk for COVID-19 are in a state of rapid change based on information released by regulatory bodies  including the CDC and  federal and state organizations. These policies and algorithms were followed during the patient's care.

## 2020-02-16 NOTE — ED Notes (Signed)
Per Dr Roel Cluck, pt to be switched from SDU to [facility]

## 2020-02-17 ENCOUNTER — Observation Stay (HOSPITAL_BASED_OUTPATIENT_CLINIC_OR_DEPARTMENT_OTHER): Payer: Medicare Other

## 2020-02-17 DIAGNOSIS — I421 Obstructive hypertrophic cardiomyopathy: Secondary | ICD-10-CM | POA: Diagnosis not present

## 2020-02-17 DIAGNOSIS — I471 Supraventricular tachycardia: Secondary | ICD-10-CM | POA: Diagnosis not present

## 2020-02-17 DIAGNOSIS — G8929 Other chronic pain: Secondary | ICD-10-CM

## 2020-02-17 DIAGNOSIS — R1013 Epigastric pain: Secondary | ICD-10-CM | POA: Diagnosis not present

## 2020-02-17 DIAGNOSIS — I351 Nonrheumatic aortic (valve) insufficiency: Secondary | ICD-10-CM

## 2020-02-17 DIAGNOSIS — I160 Hypertensive urgency: Secondary | ICD-10-CM

## 2020-02-17 DIAGNOSIS — N10 Acute tubulo-interstitial nephritis: Secondary | ICD-10-CM | POA: Diagnosis not present

## 2020-02-17 DIAGNOSIS — E118 Type 2 diabetes mellitus with unspecified complications: Secondary | ICD-10-CM

## 2020-02-17 DIAGNOSIS — R011 Cardiac murmur, unspecified: Secondary | ICD-10-CM | POA: Diagnosis not present

## 2020-02-17 DIAGNOSIS — F17200 Nicotine dependence, unspecified, uncomplicated: Secondary | ICD-10-CM

## 2020-02-17 DIAGNOSIS — I5032 Chronic diastolic (congestive) heart failure: Secondary | ICD-10-CM | POA: Diagnosis not present

## 2020-02-17 DIAGNOSIS — D72829 Elevated white blood cell count, unspecified: Secondary | ICD-10-CM

## 2020-02-17 LAB — COMPREHENSIVE METABOLIC PANEL
ALT: 22 U/L (ref 0–44)
AST: 21 U/L (ref 15–41)
Albumin: 3.7 g/dL (ref 3.5–5.0)
Alkaline Phosphatase: 57 U/L (ref 38–126)
Anion gap: 18 — ABNORMAL HIGH (ref 5–15)
BUN: 21 mg/dL (ref 8–23)
CO2: 20 mmol/L — ABNORMAL LOW (ref 22–32)
Calcium: 9.5 mg/dL (ref 8.9–10.3)
Chloride: 103 mmol/L (ref 98–111)
Creatinine, Ser: 1.08 mg/dL (ref 0.61–1.24)
GFR calc Af Amer: >60 mL/min (ref >60)
GFR calc non Af Amer: >60 mL/min (ref >60)
Glucose, Bld: 143 mg/dL — ABNORMAL HIGH (ref 70–99)
Potassium: 3 mmol/L — ABNORMAL LOW (ref 3.5–5.1)
Sodium: 141 mmol/L (ref 135–145)
Total Bilirubin: 0.9 mg/dL (ref 0.3–1.2)
Total Protein: 7.2 g/dL (ref 6.5–8.1)

## 2020-02-17 LAB — GLUCOSE, CAPILLARY
Glucose-Capillary: 118 mg/dL — ABNORMAL HIGH (ref 70–99)
Glucose-Capillary: 121 mg/dL — ABNORMAL HIGH (ref 70–99)
Glucose-Capillary: 122 mg/dL — ABNORMAL HIGH (ref 70–99)
Glucose-Capillary: 139 mg/dL — ABNORMAL HIGH (ref 70–99)
Glucose-Capillary: 140 mg/dL — ABNORMAL HIGH (ref 70–99)
Glucose-Capillary: 140 mg/dL — ABNORMAL HIGH (ref 70–99)
Glucose-Capillary: 155 mg/dL — ABNORMAL HIGH (ref 70–99)

## 2020-02-17 LAB — HEMOGLOBIN A1C
Hgb A1c MFr Bld: 7.1 % — ABNORMAL HIGH (ref 4.8–5.6)
Mean Plasma Glucose: 157.07 mg/dL

## 2020-02-17 LAB — URINE CULTURE: Culture: 70000 — AB

## 2020-02-17 LAB — CBC
HCT: 45.3 % (ref 39.0–52.0)
Hemoglobin: 13.8 g/dL (ref 13.0–17.0)
MCH: 23 pg — ABNORMAL LOW (ref 26.0–34.0)
MCHC: 30.5 g/dL (ref 30.0–36.0)
MCV: 75.6 fL — ABNORMAL LOW (ref 80.0–100.0)
Platelets: 316 10*3/uL (ref 150–400)
RBC: 5.99 MIL/uL — ABNORMAL HIGH (ref 4.22–5.81)
RDW: 15.3 % (ref 11.5–15.5)
WBC: 14.1 10*3/uL — ABNORMAL HIGH (ref 4.0–10.5)
nRBC: 0 % (ref 0.0–0.2)

## 2020-02-17 LAB — TSH: TSH: 0.364 u[IU]/mL (ref 0.350–4.500)

## 2020-02-17 LAB — TROPONIN I (HIGH SENSITIVITY): Troponin I (High Sensitivity): 28 ng/L — ABNORMAL HIGH (ref <18)

## 2020-02-17 LAB — ECHOCARDIOGRAM COMPLETE

## 2020-02-17 LAB — MAGNESIUM: Magnesium: 1.7 mg/dL (ref 1.7–2.4)

## 2020-02-17 LAB — HIV ANTIBODY (ROUTINE TESTING W REFLEX): HIV Screen 4th Generation wRfx: NONREACTIVE

## 2020-02-17 LAB — PHOSPHORUS: Phosphorus: 2.8 mg/dL (ref 2.5–4.6)

## 2020-02-17 MED ORDER — PANTOPRAZOLE SODIUM 40 MG PO TBEC
40.0000 mg | DELAYED_RELEASE_TABLET | Freq: Every day | ORAL | Status: DC
  Administered 2020-02-18 – 2020-02-20 (×3): 40 mg via ORAL
  Filled 2020-02-17 (×3): qty 1

## 2020-02-17 MED ORDER — MAGNESIUM SULFATE 4 GM/100ML IV SOLN
4.0000 g | Freq: Once | INTRAVENOUS | Status: AC
  Administered 2020-02-17: 09:00:00 4 g via INTRAVENOUS
  Filled 2020-02-17: qty 100

## 2020-02-17 MED ORDER — ADULT MULTIVITAMIN W/MINERALS CH
1.0000 | ORAL_TABLET | Freq: Every day | ORAL | Status: DC
  Administered 2020-02-17 – 2020-02-20 (×4): 1 via ORAL
  Filled 2020-02-17 (×4): qty 1

## 2020-02-17 MED ORDER — ENOXAPARIN SODIUM 30 MG/0.3ML ~~LOC~~ SOLN
30.0000 mg | SUBCUTANEOUS | Status: DC
  Administered 2020-02-17 – 2020-02-19 (×3): 30 mg via SUBCUTANEOUS
  Filled 2020-02-17 (×4): qty 0.3

## 2020-02-17 MED ORDER — POTASSIUM CHLORIDE CRYS ER 20 MEQ PO TBCR
40.0000 meq | EXTENDED_RELEASE_TABLET | ORAL | Status: AC
  Administered 2020-02-17 (×2): 40 meq via ORAL
  Filled 2020-02-17 (×2): qty 2

## 2020-02-17 MED ORDER — FOLIC ACID 1 MG PO TABS
1.0000 mg | ORAL_TABLET | Freq: Every day | ORAL | Status: DC
  Administered 2020-02-17 – 2020-02-20 (×4): 1 mg via ORAL
  Filled 2020-02-17 (×4): qty 1

## 2020-02-17 MED ORDER — THIAMINE HCL 100 MG PO TABS
100.0000 mg | ORAL_TABLET | Freq: Every day | ORAL | Status: DC
  Administered 2020-02-17 – 2020-02-20 (×4): 100 mg via ORAL
  Filled 2020-02-17 (×4): qty 1

## 2020-02-17 NOTE — Plan of Care (Signed)
  Problem: Pain Managment: Goal: General experience of comfort will improve Outcome: Progressing   Problem: Safety: Goal: Ability to remain free from injury will improve Outcome: Progressing   Problem: Skin Integrity: Goal: Risk for impaired skin integrity will decrease Outcome: Progressing   

## 2020-02-17 NOTE — Progress Notes (Signed)
  Echocardiogram 2D Echocardiogram has been performed.  [name] A [name] 02/17/2020, 11:09 AM

## 2020-02-17 NOTE — Plan of Care (Signed)

## 2020-02-17 NOTE — Progress Notes (Addendum)
PROGRESS NOTE    [name]  HCW:[id number] DOB: [birth year] DOA: 02/16/2020 PCP: Nolene Ebbs, MD    Brief Narrative:  Patient 69 year old gentleman history of hypertension, diabetes, chronic diastolic CHF, history of CVA, recurrent UTIs, alcohol abuse, polysubstance abuse presented to the ED with a 2-day history of abdominal pain nausea and vomiting with epigastric pain described as squeezing and worse when he applies pressure to it.  Patient also with complaints of dysuria.  Unable to tolerate oral intake.  Unable to take medications.  Noted to be in hypertensive urgency.  Patient seen in the ED CT abdomen and pelvis done with diffuse bladder wall thickening and several bladder diverticula findings likely representing combination of chronic infection and bladder outlet obstruction.  Loss of fat plane between sigmoid colon and bladder wall suggestive of adhesion can predispose to colovesicular fistula.  Urinalysis done with trace leukocytes, nitrite negative, greater than 50 WBCs, triple phosphate crystals noted.  Patient placed empirically on IV Rocephin and pancultured.   Assessment & Plan:   Active Problems:   Diabetes mellitus type 2, controlled ([place])   Tobacco use disorder   Leukocytosis   Chronic pain   Essential hypertension   Chronic diastolic heart failure (HCC)   Epigastric pain   Hypertensive urgency   Cardiac murmur   Acute pyelonephritis  1 acute pyelonephritis Patient had presented with nausea vomiting noted to have a leukocytosis with abdominal pain and inability to keep oral intake down.  CT which was done was negative for perinephric abscess however did show diffuse bladder wall thickening and several bladder diverticula, loss of fat plane between sigmoid colon and bladder wall suggestive of adhesion can predispose to colovesicular fistula.  Urinalysis done concerning for UTI with triple phosphate crystals noted.  Patient improving clinically.  Urine cultures  pending.  Check blood cultures.  Continue empiric IV Rocephin and Pyridium.  Tolerating clears and will advance to a full liquid diet  2.  Hypertensive urgency In the setting of medication noncompliance secondary to nausea and vomiting and inability to keep oral intake down.  Patient tolerating clears.  Blood pressure of 190/61 this morning.  Continue home regimen of Norvasc, hydralazine, HCTZ, Isordil.  3.  Chronic diastolic heart failure Euvolemic.  Monitor closely with gentle hydration.  Continue cardiac regimen of Norvasc, hydralazine, Isordil, HCTZ.  Follow.  4.  Leukocytosis Likely secondary to problem #1.  Urine cultures pending.  Check blood cultures x2.  Continue empiric IV Rocephin.  5.  Epigastric pain Noted to be worse after taking Aleve.  Patient with some clinical improvement.  Continue Carafate.  Continue PPI.  6.  Cardiac murmur 2D echo pending.  7.  Tobacco abuse Tobacco cessation stressed to patient.  Continue nicotine patch.  8.  Polysubstance abuse UDS noted to be positive for cocaine.  Polysubstance cessation stressed to patient.  Social work consult.  9.  History of alcohol abuse Patient did tell admitting physician he had not been drinking for the past 6 years.  Alcohol level negative.  Monitor closely for signs of withdrawal.  Placed on thiamine, folic acid, multivitamin.  10.  Diabetes mellitus type 2 Hemoglobin A1c 7.1.  CBG of 121 this morning.  Hold Metformin.  Continue sliding scale insulin.  Follow.  11.  Chronic pain Stable.  Continue home pain regimen.  12.  Hypokalemia/hypomagnesemia Likely secondary to GI losses.  Replete.   DVT prophylaxis: Lovenox Code Status: Full Family Communication: Updated patient.  No family at bedside. Disposition Plan:  .  Patient came from: Home.            . Anticipated d/c place: Home. . Barriers to d/c OR conditions which need to be met to effect a safe d/c: Likely home when clinically improved, tolerating oral  intake, finalization of urine cultures and sensitivities.   Consultants:   None  Procedures:   CT abdomen and pelvis 02/16/2020  2D echo pending 02/17/2020  Antimicrobials:   IV Rocephin 02/16/2020     Subjective: Patient sitting up in the chair on the telephone with insurance company.  Denies any chest pain or shortness of breath.  Tolerating clear liquids.  Denies any nausea or emesis.  States lower abdominal pain improving.  Objective: Vitals:   02/16/20 2330 02/16/20 2359 02/17/20 0346 02/17/20 0756  BP: (!) 170/56 (!) 134/59 (!) 166/53 (!) 190/61  Pulse:  97 89 83  Resp: (!) '28 19 17 16  ' Temp:  100.3 F (37.9 C) 99.8 F (37.7 C) 99 F (37.2 C)  TempSrc:  Oral Oral Oral  SpO2:  96% 100% 98%    Intake/Output Summary (Last 24 hours) at 02/17/2020 1214 Last data filed at 02/16/2020 2342 Gross per 24 hour  Intake 800 ml  Output --  Net 800 ml   There were no vitals filed for this visit.  Examination:  General exam: Appears calm and comfortable  Respiratory system: Clear to auscultation. Respiratory effort normal. Cardiovascular system: Regular rate rhythm with 3/6 systolic ejection murmur.  No JVD.  No lower extremity edema.  Gastrointestinal system: Abdomen is nondistended, soft and some tenderness to palpation bilateral lower quadrants.  Positive bowel sounds.  No rebound.  No guarding.  Central nervous system: Alert and oriented. No focal neurological deficits. Extremities: Symmetric 5 x 5 power. Skin: No rashes, lesions or ulcers Psychiatry: Judgement and insight appear normal. Mood & affect appropriate.     Data Reviewed: I have personally reviewed following labs and imaging studies  CBC: Recent Labs  Lab 02/16/20 1455 02/17/20 0055  WBC 12.2* 14.1*  HGB 15.8 13.8  HCT 51.0 45.3  MCV 74.3* 75.6*  PLT 324 694   Basic Metabolic Panel: Recent Labs  Lab 02/16/20 1455 02/17/20 0055  NA 141 141  K 3.8 3.0*  CL 110 103  CO2 20* 20*  GLUCOSE  156* 143*  BUN 21 21  CREATININE 1.04 1.08  CALCIUM 9.6 9.5  MG  --  1.7  PHOS  --  2.8   GFR: CrCl cannot be calculated (Unknown ideal weight.). Liver Function Tests: Recent Labs  Lab 02/16/20 1455 02/17/20 0055  AST 27 21  ALT 26 22  ALKPHOS 62 57  BILITOT 0.8 0.9  PROT 8.0 7.2  ALBUMIN 4.0 3.7   Recent Labs  Lab 02/16/20 1455  LIPASE 23   No results for input(s): AMMONIA in the last 168 hours. Coagulation Profile: No results for input(s): INR, PROTIME in the last 168 hours. Cardiac Enzymes: Recent Labs  Lab 02/16/20 1922  CKTOTAL 61   BNP (last 3 results) No results for input(s): PROBNP in the last 8760 hours. HbA1C: Recent Labs    02/17/20 0051  HGBA1C 7.1*   CBG: Recent Labs  Lab 02/16/20 2038 02/17/20 0004 02/17/20 0435 02/17/20 0800  GLUCAP 128* 118* 140* 121*   Lipid Profile: No results for input(s): CHOL, HDL, LDLCALC, TRIG, CHOLHDL, LDLDIRECT in the last 72 hours. Thyroid Function Tests: Recent Labs    02/17/20 0051  TSH 0.364   Anemia Panel: No results  for input(s): VITAMINB12, FOLATE, FERRITIN, TIBC, IRON, RETICCTPCT in the last 72 hours. Sepsis Labs: No results for input(s): PROCALCITON, LATICACIDVEN in the last 168 hours.  Recent Results (from the past 240 hour(s))  Respiratory Panel by RT PCR (Flu A&B, Covid) - Nasopharyngeal Swab     Status: None   Collection Time: 02/16/20  7:47 PM   Specimen: Nasopharyngeal Swab  Result Value Ref Range Status   SARS Coronavirus 2 by RT PCR NEGATIVE NEGATIVE Final    Comment: (NOTE) SARS-CoV-2 target nucleic acids are NOT DETECTED. The SARS-CoV-2 RNA is generally detectable in upper respiratoy specimens during the acute phase of infection. The lowest concentration of SARS-CoV-2 viral copies this assay can detect is 131 copies/mL. A negative result does not preclude SARS-Cov-2 infection and should not be used as the sole basis for treatment or other patient management decisions. A negative  result may occur with  improper specimen collection/handling, submission of specimen other than nasopharyngeal swab, presence of viral mutation(s) within the areas targeted by this assay, and inadequate number of viral copies (<131 copies/mL). A negative result must be combined with clinical observations, patient history, and epidemiological information. The expected result is Negative. Fact Sheet for Patients:  PinkCheek.be Fact Sheet for Healthcare Providers:  GravelBags.it This test is not yet ap proved or cleared by the [place] FDA and  has been authorized for detection and/or diagnosis of SARS-CoV-2 by FDA under an Emergency Use Authorization (EUA). This EUA will remain  in effect (meaning this test can be used) for the duration of the COVID-19 declaration under Section 564(b)(1) of the Act, 21 U.S.C. section 360bbb-3(b)(1), unless the authorization is terminated or revoked sooner.    Influenza A by PCR NEGATIVE NEGATIVE Final   Influenza B by PCR NEGATIVE NEGATIVE Final    Comment: (NOTE) The Xpert Xpress SARS-CoV-2/FLU/RSV assay is intended as an aid in  the diagnosis of influenza from Nasopharyngeal swab specimens and  should not be used as a sole basis for treatment. Nasal washings and  aspirates are unacceptable for Xpert Xpress SARS-CoV-2/FLU/RSV  testing. Fact Sheet for Patients: PinkCheek.be Fact Sheet for Healthcare Providers: GravelBags.it This test is not yet approved or cleared by the [place] FDA and  has been authorized for detection and/or diagnosis of SARS-CoV-2 by  FDA under an Emergency Use Authorization (EUA). This EUA will remain  in effect (meaning this test can be used) for the duration of the  Covid-19 declaration under Section 564(b)(1) of the Act, 21  U.S.C. section 360bbb-3(b)(1), unless the authorization is  terminated or  revoked. Performed at [facility], [place] [address], [place], [place] [address]          Radiology Studies: CT ABDOMEN PELVIS W CONTRAST  Result Date: 02/16/2020 CLINICAL DATA:  69 year old male with abdominal pain, nausea vomiting. EXAM: CT ABDOMEN AND PELVIS WITH CONTRAST TECHNIQUE: Multidetector CT imaging of the abdomen and pelvis was performed using the standard protocol following bolus administration of intravenous contrast. CONTRAST:  178m OMNIPAQUE IOHEXOL 300 MG/ML  SOLN COMPARISON:  CT abdomen pelvis dated 11/15/2019. FINDINGS: Lower chest: The visualized lung bases are clear. There is coronary vascular calcification primarily involving the left circumflex artery. No intra-abdominal free air or free fluid. Hepatobiliary: Small left hepatic hypodense lesions are suboptimally characterized but appears similar to prior CT, likely cysts or hemangioma. There is apparent background of fatty infiltration of the liver. No intrahepatic biliary ductal dilatation. No calcified gallstone or pericholecystic fluid. Pancreas: Unremarkable. No pancreatic  ductal dilatation or surrounding inflammatory changes. Spleen: Normal in size without focal abnormality. Adrenals/Urinary Tract: The adrenal glands are unremarkable. There is no hydronephrosis on either side. There is symmetric enhancement and excretion of contrast by both kidneys. The visualized ureters appear unremarkable. There is diffuse thickened bladder wall with large bladder diverticula. Findings likely combination of chronic bladder outlet obstruction and infection. Correlation with urinalysis recommended to evaluate for acute cystitis. Stomach/Bowel: There is extensive sigmoid diverticulosis and scattered colonic diverticula without active inflammatory changes. There is loss of fat plane between the sigmoid colon and bladder wall suggestive of adhesion. This can predispose to a colovesical fistula. There is no bowel obstruction or active  inflammation. The appendix is normal. Vascular/Lymphatic: Moderate aortoiliac atherosclerotic disease. The IVC is unremarkable. No portal venous gas. There is no adenopathy. Reproductive: The prostate and seminal vesicles are grossly unremarkable. Other: None Musculoskeletal: No acute or significant osseous findings. IMPRESSION: 1. No acute intra-abdominal or pelvic pathology. Extensive colonic diverticulosis. No bowel obstruction or active inflammation. Normal appendix. 2. Diffuse bladder wall thickening and several bladder diverticula. Findings likely represent combination of chronic infection and bladder outlet obstruction. Correlation with urinalysis recommended to exclude active cystitis. 3. Loss of fat plane between sigmoid colon and bladder wall suggestive of adhesion. This can predispose to a colovesical fistula. 4. Fatty liver. 5. Moderate Aortic Atherosclerosis (ICD10-I70.0). Electronically Signed   By: Anner Crete M.D.   On: 02/16/2020 18:22        Scheduled Meds: . amLODipine  10 mg Oral Daily  . aspirin EC  81 mg Oral Daily  . gabapentin  100 mg Oral TID  . hydrALAZINE  50 mg Oral Q8H  . hydrochlorothiazide  12.5 mg Oral Daily  . insulin aspart  0-15 Units Subcutaneous Q4H  . isosorbide dinitrate  10 mg Oral TID  . nicotine  21 mg Transdermal Daily  . pantoprazole  20 mg Oral Daily  . phenazopyridine  200 mg Oral TID WC  . potassium chloride  40 mEq Oral Q4H  . pravastatin  20 mg Oral QHS  . sodium chloride flush  3 mL Intravenous Once  . sucralfate  1 g Oral TID WC & HS   Continuous Infusions: . cefTRIAXone (ROCEPHIN)  IV Stopped (02/16/20 2342)     LOS: 0 days    Time spent: 40 minutes    Irine Seal, MD [facility]   To contact the attending provider between 7A-7P or the covering provider during after hours 7P-7A, please log into the web site [email or web address] and access using universal [place] password for that web site. If you do not have the  password, please call the hospital operator.  02/17/2020, 12:14 PM

## 2020-02-17 NOTE — Evaluation (Signed)
Physical Therapy Evaluation Patient Details Name: [name] MRN: [id number] DOB: 03-08-1951 Today's Date: 02/17/2020   History of Present Illness  Pt is a 69 y/o male admitted secondary to abdominal pain and found to have pyelonephritis. PMH including but not limited to HTN, DM, CHF, ETOH abuse.     Clinical Impression  Pt presented supine in bed with HOB elevated, awake and willing to participate in therapy session. Prior to admission, pt reported that he ambulated with use of a RW as needed. He lives alone in a single level home with a level entry. At the time of evaluation, pt overall at supervision level with all functional mobility including hallway ambulation. Pt with no LOB or need for physical assistance throughout. Pt reporting that he feels he is at his baseline in regards to mobility. No further acute PT needs identified at this time. PT signing off.     Follow Up Recommendations No PT follow up    Equipment Recommendations  None recommended by PT    Recommendations for Other Services       Precautions / Restrictions Precautions Precautions: None Restrictions Weight Bearing Restrictions: No      Mobility  Bed Mobility Overal bed mobility: Modified Independent                Transfers Overall transfer level: Modified independent                  Ambulation/Gait Ambulation/Gait assistance: Supervision Gait Distance (Feet): 200 Feet Assistive device: IV Pole Gait Pattern/deviations: Step-through pattern Gait velocity: decreased   General Gait Details: pt overall steady with hallway ambulation while pushing his IV pole. No LOB or need for physical assistance, supervision for safety  Stairs            Wheelchair Mobility    Modified Rankin (Stroke Patients Only)       Balance Overall balance assessment: No apparent balance deficits (not formally assessed)                                           Pertinent  Vitals/Pain Pain Assessment: No/denies pain    Home Living Family/patient expects to be discharged to:: Private residence Living Arrangements: Alone Available Help at Discharge: Family;Friend(s);Available PRN/intermittently   Home Access: Level entry     Home Layout: One level Home Equipment: Walker - 2 wheels      Prior Function Level of Independence: Independent with assistive device(s)         Comments: ambulates with a RW PRN     Hand Dominance        Extremity/Trunk Assessment   Upper Extremity Assessment Upper Extremity Assessment: Overall WFL for tasks assessed    Lower Extremity Assessment Lower Extremity Assessment: Overall WFL for tasks assessed    Cervical / Trunk Assessment Cervical / Trunk Assessment: Normal  Communication   Communication: No difficulties  Cognition Arousal/Alertness: Awake/alert Behavior During Therapy: WFL for tasks assessed/performed Overall Cognitive Status: Within Functional Limits for tasks assessed                                        General Comments      Exercises     Assessment/Plan    PT Assessment Patent does not need any further PT  services  PT Problem List         PT Treatment Interventions      PT Goals (Current goals can be found in the Care Plan section)  Acute Rehab PT Goals Patient Stated Goal: "go home" PT Goal Formulation: All assessment and education complete, DC therapy    Frequency     Barriers to discharge        Co-evaluation               AM-PAC PT "6 Clicks" Mobility  Outcome Measure Help needed turning from your back to your side while in a flat bed without using bedrails?: None Help needed moving from lying on your back to sitting on the side of a flat bed without using bedrails?: None Help needed moving to and from a bed to a chair (including a wheelchair)?: None Help needed standing up from a chair using your arms (e.g., wheelchair or bedside chair)?:  None Help needed to walk in hospital room?: None Help needed climbing 3-5 steps with a railing? : None 6 Click Score: 24    End of Session   Activity Tolerance: Patient tolerated treatment well Patient left: in bed;with call bell/phone within reach Nurse Communication: Mobility status PT Visit Diagnosis: Other abnormalities of gait and mobility (R26.89)    Time: [id number] PT Time Calculation (min) (ACUTE ONLY): 11 min   Charges:   PT Evaluation $PT Eval Low Complexity: 1 Low          Eduard Clos, PT, DPT  Acute Rehabilitation Services Pager [phone] Office [place] 02/17/2020, 11:32 AM

## 2020-02-18 ENCOUNTER — Encounter (HOSPITAL_COMMUNITY): Payer: Self-pay | Admitting: Internal Medicine

## 2020-02-18 DIAGNOSIS — R011 Cardiac murmur, unspecified: Secondary | ICD-10-CM | POA: Diagnosis not present

## 2020-02-18 DIAGNOSIS — I10 Essential (primary) hypertension: Secondary | ICD-10-CM

## 2020-02-18 DIAGNOSIS — R1013 Epigastric pain: Secondary | ICD-10-CM | POA: Diagnosis not present

## 2020-02-18 DIAGNOSIS — N390 Urinary tract infection, site not specified: Secondary | ICD-10-CM | POA: Diagnosis not present

## 2020-02-18 DIAGNOSIS — I5032 Chronic diastolic (congestive) heart failure: Secondary | ICD-10-CM | POA: Diagnosis not present

## 2020-02-18 DIAGNOSIS — I421 Obstructive hypertrophic cardiomyopathy: Secondary | ICD-10-CM | POA: Diagnosis present

## 2020-02-18 DIAGNOSIS — N10 Acute tubulo-interstitial nephritis: Secondary | ICD-10-CM | POA: Diagnosis not present

## 2020-02-18 HISTORY — DX: Obstructive hypertrophic cardiomyopathy: I42.1

## 2020-02-18 LAB — GLUCOSE, CAPILLARY
Glucose-Capillary: 112 mg/dL — ABNORMAL HIGH (ref 70–99)
Glucose-Capillary: 132 mg/dL — ABNORMAL HIGH (ref 70–99)
Glucose-Capillary: 169 mg/dL — ABNORMAL HIGH (ref 70–99)
Glucose-Capillary: 170 mg/dL — ABNORMAL HIGH (ref 70–99)
Glucose-Capillary: 171 mg/dL — ABNORMAL HIGH (ref 70–99)
Glucose-Capillary: 212 mg/dL — ABNORMAL HIGH (ref 70–99)

## 2020-02-18 LAB — MAGNESIUM: Magnesium: 2.4 mg/dL (ref 1.7–2.4)

## 2020-02-18 LAB — BASIC METABOLIC PANEL
Anion gap: 13 (ref 5–15)
BUN: 17 mg/dL (ref 8–23)
CO2: 21 mmol/L — ABNORMAL LOW (ref 22–32)
Calcium: 8.9 mg/dL (ref 8.9–10.3)
Chloride: 106 mmol/L (ref 98–111)
Creatinine, Ser: 1.05 mg/dL (ref 0.61–1.24)
GFR calc Af Amer: >60 mL/min (ref >60)
GFR calc non Af Amer: >60 mL/min (ref >60)
Glucose, Bld: 159 mg/dL — ABNORMAL HIGH (ref 70–99)
Potassium: 3.6 mmol/L (ref 3.5–5.1)
Sodium: 140 mmol/L (ref 135–145)

## 2020-02-18 LAB — URINALYSIS, ROUTINE W REFLEX MICROSCOPIC
Bilirubin Urine: NEGATIVE
Glucose, UA: NEGATIVE mg/dL
Ketones, ur: NEGATIVE mg/dL
Nitrite: POSITIVE — AB
Protein, ur: NEGATIVE mg/dL
Specific Gravity, Urine: 1.017 (ref 1.005–1.030)
WBC, UA: >50 WBC/hpf — ABNORMAL HIGH (ref 0–5)
pH: 7 (ref 5.0–8.0)

## 2020-02-18 LAB — CBC WITH DIFFERENTIAL/PLATELET
Abs Immature Granulocytes: 0.03 10*3/uL (ref 0.00–0.07)
Basophils Absolute: 0.1 10*3/uL (ref 0.0–0.1)
Basophils Relative: 0 %
Eosinophils Absolute: 0.2 10*3/uL (ref 0.0–0.5)
Eosinophils Relative: 1 %
HCT: 42 % (ref 39.0–52.0)
Hemoglobin: 12.9 g/dL — ABNORMAL LOW (ref 13.0–17.0)
Immature Granulocytes: 0 %
Lymphocytes Relative: 35 %
Lymphs Abs: 4.1 10*3/uL — ABNORMAL HIGH (ref 0.7–4.0)
MCH: 23.2 pg — ABNORMAL LOW (ref 26.0–34.0)
MCHC: 30.7 g/dL (ref 30.0–36.0)
MCV: 75.4 fL — ABNORMAL LOW (ref 80.0–100.0)
Monocytes Absolute: 0.9 10*3/uL (ref 0.1–1.0)
Monocytes Relative: 8 %
Neutro Abs: 6.4 10*3/uL (ref 1.7–7.7)
Neutrophils Relative %: 56 %
Platelets: 282 10*3/uL (ref 150–400)
RBC: 5.57 MIL/uL (ref 4.22–5.81)
RDW: 14.6 % (ref 11.5–15.5)
WBC: 11.7 10*3/uL — ABNORMAL HIGH (ref 4.0–10.5)
nRBC: 0 % (ref 0.0–0.2)

## 2020-02-18 MED ORDER — DILTIAZEM HCL ER COATED BEADS 120 MG PO CP24
240.0000 mg | ORAL_CAPSULE | Freq: Every day | ORAL | Status: DC
  Administered 2020-02-18 – 2020-02-20 (×3): 240 mg via ORAL
  Filled 2020-02-18 (×3): qty 2

## 2020-02-18 MED ORDER — HYDRALAZINE HCL 50 MG PO TABS
75.0000 mg | ORAL_TABLET | Freq: Three times a day (TID) | ORAL | Status: DC
  Administered 2020-02-18 – 2020-02-20 (×6): 75 mg via ORAL
  Filled 2020-02-18 (×6): qty 1

## 2020-02-18 MED ORDER — POTASSIUM CHLORIDE CRYS ER 20 MEQ PO TBCR
40.0000 meq | EXTENDED_RELEASE_TABLET | Freq: Once | ORAL | Status: AC
  Administered 2020-02-18: 40 meq via ORAL
  Filled 2020-02-18: qty 2

## 2020-02-18 MED ORDER — HYDRALAZINE HCL 25 MG PO TABS
25.0000 mg | ORAL_TABLET | Freq: Once | ORAL | Status: AC
  Administered 2020-02-18: 25 mg via ORAL
  Filled 2020-02-18: qty 1

## 2020-02-18 NOTE — Consult Note (Signed)
Cardiology Consultation:   Patient ID: [name] MRN: [id number]; DOB: 1951-10-26  Admit date: 02/16/2020 Date of Consult: 02/18/2020  Primary Care Provider: Nolene Ebbs, MD Primary Cardiologist: No primary care provider on file.  Previously seen by Dr.Branch and Nahser in 2017, lost to follow-up Primary Electrophysiologist:  None    Patient Profile:   [name] is a 68 y.o. 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 Grandville Silos.  History of Present Illness:   [name] is a 70 year old male previously seen by cardiology in 2017 and 2014, lost to follow-up with recent nausea vomiting epigastric pain, squeezing-like sensation with hypertensive urgency.  Signs of chronic infection of bladder and bladder outlet obstruction were noted.  He was placed on IV Rocephin.  Harsh murmur was heard on exam by Dr. Grandville Silos and an echocardiogram was ordered  An echocardiogram was obtained which demonstrated hypertrophic obstructive cardiomyopathy, hyperdynamic contraction with mitral valve chordal apparatus systolic anterior motion.  See echocardiogram report as below.  Overall he is not having any recent chest discomfort or significant shortness of breath.  No syncope.  He was admitted primarily for acute pyelonephritis.  He is not on a beta-blocker currently with his hypertension.  He is on a home regimen of Norvasc hydralazine hydrochlorothiazide and isosorbide.  Continues to smoke.  His urine drug screen was positive for cocaine.  Diabetes is controlled with Metformin.  Hemoglobin A1c was 7.1.  Overall feeling better.  Abdominal pain is improved.  Good overall urine output.  Denies any chest pain.  He did state that over a year ago while he was standing up in the kitchen cooking, he fell to the  ground but he knew he was on the ground.  Got his way back up.  Unsure what happened.  Heart Pathway Score:     Past Medical History:  Diagnosis Date  . Alcohol dependence ([facility]) 12/10/2013  . Angioedema of lips 12/24/2013  . Brain tumor ([place])   . CHF (congestive heart failure) ([facility])    diastolic  . Chronic back pain   . Chronic headache   . CVA (cerebral vascular accident) ([place])   . Diabetes mellitus   . 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[place]) 02/18/2020  . HTN (hypertension), malignant 09/06/2012  . Hypertension   . Narcotic abuse ([place])   . Normal cardiac stress test 2009  . TIA (transient ischemic attack) 09/06/2012  . Tobacco use disorder 12/10/2013  . UTI (urinary tract infection)   . Vertigo 09/06/2012    Past Surgical History:  Procedure Laterality Date  . NO PAST SURGERIES       Home Medications:  Prior to Admission medications   Medication Sig Start Date End Date Taking? Authorizing Provider  acetaminophen (TYLENOL) 500 MG tablet Take 1 tablet (500 mg total) by mouth every 6 (six) hours as needed for mild pain. 06/16/18  Yes Fawze, Mina A, PA-C  albuterol (PROVENTIL HFA;VENTOLIN HFA) 108 (90 Base) MCG/ACT inhaler Inhale 1-2 puffs into the lungs every 6 (six) hours as needed for wheezing. 09/04/16  Yes Tanna Furry, MD  amLODipine (NORVASC) 10 MG tablet Take 10 mg by mouth daily.   Yes [provider]  aspirin EC 81 MG tablet Take 81 mg by mouth daily.   Yes [provider]  docusate sodium (COLACE) 100 MG capsule Take 1  capsule (100 mg total) by mouth every 12 (twelve) hours. Patient taking differently: Take 100 mg by mouth daily.  01/02/18  Yes Little, Wenda Overland, MD  gabapentin (NEURONTIN) 100 MG capsule Take 100 mg by mouth 3 (three) times daily. 06/10/16  Yes [provider]  hydrALAZINE (APRESOLINE) 50 MG tablet Take 50 mg by mouth every 8 (eight) hours. 02/11/20  Yes [provider]  ibuprofen (ADVIL,MOTRIN) 800 MG tablet  Take 0.5 tablets (400 mg total) by mouth every 6 (six) hours as needed (pain). 06/16/18  Yes Fawze, Mina A, PA-C  losartan-hydrochlorothiazide (HYZAAR) 100-12.5 MG tablet Take 1 tablet by mouth daily. 01/24/20  Yes [provider]  metFORMIN (GLUCOPHAGE) 500 MG tablet Take 500 mg by mouth 2 (two) times daily with a meal.   Yes [provider]  oxyCODONE-acetaminophen (PERCOCET/ROXICET) 5-325 MG tablet Take 1 tablet by mouth every 8 (eight) hours as needed for moderate pain.  06/10/16  Yes [provider]  pantoprazole (PROTONIX) 20 MG tablet Take 1 tablet (20 mg total) by mouth daily. 07/02/15  Yes [name], MD  pravastatin (PRAVACHOL) 20 MG tablet Take 20 mg by mouth at bedtime.   Yes [provider]  Vitamin D, Ergocalciferol, (DRISDOL) 1.25 MG (50000 UNIT) CAPS capsule Take 50,000 Units by mouth once a week. Mondays 02/14/20  Yes [provider]  HYDROcodone-acetaminophen (NORCO) 5-325 MG tablet Take 1-2 tablets by mouth every 6 (six) hours as needed. Patient not taking: Reported on 02/16/2020 12/27/17   Veryl Speak, MD  isosorbide dinitrate (ISORDIL) 10 MG tablet Take 1 tablet (10 mg total) by mouth 3 (three) times daily. Patient not taking: Reported on 02/16/2020 12/27/13   Debbe Odea, MD  ondansetron (ZOFRAN ODT) 4 MG disintegrating tablet Take 1 tablet (4 mg total) by mouth every 8 (eight) hours as needed for nausea or vomiting. Patient not taking: Reported on 02/16/2020 11/15/19   Lucrezia Starch, MD  ondansetron (ZOFRAN) 4 MG tablet Take 1 tablet (4 mg total) by mouth every 6 (six) hours. Patient not taking: Reported on 02/16/2020 03/20/16   Montine Circle, PA-C  tamsulosin (FLOMAX) 0.4 MG CAPS capsule Take 1 capsule (0.4 mg total) by mouth daily. Patient not taking: Reported on 02/16/2020 07/18/16   Josephina Gip, PA-C    Inpatient Medications: Scheduled Meds: . amLODipine  10 mg Oral Daily  . aspirin EC  81 mg Oral Daily  .  enoxaparin (LOVENOX) injection  30 mg Subcutaneous Q24H  . folic acid  1 mg Oral Daily  . gabapentin  100 mg Oral TID  . hydrALAZINE  50 mg Oral Q8H  . hydrochlorothiazide  12.5 mg Oral Daily  . insulin aspart  0-15 Units Subcutaneous Q4H  . isosorbide dinitrate  10 mg Oral TID  . multivitamin with minerals  1 tablet Oral Daily  . nicotine  21 mg Transdermal Daily  . pantoprazole  40 mg Oral Daily  . phenazopyridine  200 mg Oral TID WC  . pravastatin  20 mg Oral QHS  . sodium chloride flush  3 mL Intravenous Once  . sucralfate  1 g Oral TID WC & HS  . thiamine  100 mg Oral Daily   Continuous Infusions: . cefTRIAXone (ROCEPHIN)  IV 1 g (02/17/20 2154)   PRN Meds: acetaminophen **OR** acetaminophen, albuterol, hydrALAZINE, HYDROcodone-acetaminophen, ondansetron **OR** ondansetron (ZOFRAN) IV  Allergies:   No Known Allergies  Social History:   Social History   Socioeconomic History  . Marital status: Legally Separated  Spouse name: Not on file  . Number of children: Not on file  . Years of education: Not on file  . Highest education level: Not on file  Occupational History  . Not on file  Tobacco Use  . Smoking status: Current Every Day Smoker    Packs/day: 0.50    Types: Cigarettes  . Smokeless tobacco: Current User  Substance and Sexual Activity  . Alcohol use: Yes  . Drug use: Yes    Types: Marijuana, Cocaine, IV    Comment: Heroin - one month ago  . Sexual activity: Not on file  Other Topics Concern  . Not on file  Social History Narrative   Single.  Lives alone.     Social Determinants of Health   Financial Resource Strain:   . Difficulty of Paying Living Expenses: Not on file  Food Insecurity:   . Worried About Charity fundraiser in the Last Year: Not on file  . Ran Out of Food in the Last Year: Not on file  Transportation Needs:   . Lack of Transportation (Medical): Not on file  . Lack of Transportation (Non-Medical): Not on file  Physical Activity:    . Days of Exercise per Week: Not on file  . Minutes of Exercise per Session: Not on file  Stress:   . Feeling of Stress : Not on file  Social Connections:   . Frequency of Communication with Friends and Family: Not on file  . Frequency of Social Gatherings with Friends and Family: Not on file  . Attends Religious Services: Not on file  . Active Member of Clubs or Organizations: Not on file  . Attends Archivist Meetings: Not on file  . Marital Status: Not on file  Intimate Partner Violence:   . Fear of Current or Ex-Partner: Not on file  . Emotionally Abused: Not on file  . Physically Abused: Not on file  . Sexually Abused: Not on file    Family History:    Family History  Problem Relation Age of Onset  . CAD Father        "heart attack in his 87's"  . Hypertension Father   . Diabetes Brother   . Diabetes Sister   . Hypertension Mother      ROS:  Please see the history of present illness.  As above All other ROS reviewed and negative.     Physical Exam/Data:   Vitals:   02/18/20 0314 02/18/20 0744 02/18/20 1207 02/18/20 1420  BP: (!) 138/54 (!) 151/58 (!) 148/64 (!) 164/54  Pulse: 75 89  77  Resp: 17 17  17   Temp: 98.8 F (37.1 C) 99.2 F (37.3 C)  98.7 F (37.1 C)  TempSrc: Oral Oral  Oral  SpO2: 97% 99%  100%    Intake/Output Summary (Last 24 hours) at 02/18/2020 1636 Last data filed at 02/17/2020 1841 Gross per 24 hour  Intake 240 ml  Output --  Net 240 ml   Last 3 Weights 11/21/2018 06/16/2018 01/02/2018  Weight (lbs) 160 lb 165 lb 160 lb  Weight (kg) 72.576 kg 74.844 kg 72.576 kg     There is no height or weight on file to calculate BMI.  General:  Well nourished, well developed, in no acute distress HEENT: normal Lymph: no adenopathy Neck: no JVD Endocrine:  No thryomegaly Vascular: No carotid bruits; FA pulses 2+ bilaterally without bruits  Cardiac:  normal S1, S2; RRR; harsh systolic murmur Lungs:  clear to auscultation  bilaterally,  no wheezing, rhonchi or rales  Abd: soft, nontender, no hepatomegaly  Ext: no edema Musculoskeletal:  No deformities, BUE and BLE strength normal and equal Skin: warm and dry  Neuro:  CNs 2-12 intact, no focal abnormalities noted Psych:  Normal affect   EKG:  The EKG was personally reviewed and demonstrates: Sinus rhythm, right atrial enlargement, poor R wave progression noted  Telemetry:  Telemetry was personally reviewed and demonstrates: Normal sinus rhythm  Relevant CV Studies:  Echocardiogram 02/17/2020:  1. The LV is hyperdynamic with LVOT obstruction. Vmax 3.3 m/s and peak  gradient 43 mmHG. There appears to be chordal SAM with minimal anterior  mitral valve SAM. There is no characteristic posteriorly directed MR as  expected with HOCM. There is mild LVH  that is not asymmetric. Overall, this could represent obstruction at the  level of the chordal apparatus due to hyperdynamic LV/small LV vs  hypertrophic obstructive cardiomyopathy. A cardiac MRI would be helpful to  distinguish the pathology. Left  ventricular ejection fraction, by estimation, is >75%. The left ventricle  has hyperdynamic function. The left ventricle has no regional wall motion  abnormalities. There is mild concentric left ventricular hypertrophy. Left  ventricular diastolic  parameters are consistent with Grade I diastolic dysfunction (impaired  relaxation).  2. Right ventricular systolic function is normal. The right ventricular  size is normal. Tricuspid regurgitation signal is inadequate for assessing  PA pressure.  3. Left atrial size was mildly dilated.  4. The mitral valve is grossly normal. Trivial mitral valve  regurgitation.  5. The aortic valve is tricuspid. Aortic valve regurgitation is mild. No  aortic stenosis is present.  6. The inferior vena cava is normal in size with greater than 50%  respiratory variability, suggesting right atrial pressure of 3 mmHg.   Comparison(s): A prior  study was performed on 01/23/2017. Prior images  unable to be directly viewed, comparison made by report only. No  significant change from prior study.   Conclusion(s)/Recomendation(s): Findings concerning for HOCM, would  recommend Cardiac MRI for clarification.   Laboratory Data:  High Sensitivity Troponin:   Recent Labs  Lab 02/16/20 1455 02/16/20 1855 02/16/20 1944 02/17/20 0051  TROPONINIHS 15 21* 20* 28*     Chemistry Recent Labs  Lab 02/16/20 1455 02/17/20 0055 02/18/20 0331  NA 141 141 140  K 3.8 3.0* 3.6  CL 110 103 106  CO2 20* 20* 21*  GLUCOSE 156* 143* 159*  BUN 21 21 17   CREATININE 1.04 1.08 1.05  CALCIUM 9.6 9.5 8.9  GFRNONAA >60 >60 >60  GFRAA >60 >60 >60  ANIONGAP 11 18* 13    Recent Labs  Lab 02/16/20 1455 02/17/20 0055  PROT 8.0 7.2  ALBUMIN 4.0 3.7  AST 27 21  ALT 26 22  ALKPHOS 62 57  BILITOT 0.8 0.9   Hematology Recent Labs  Lab 02/16/20 1455 02/17/20 0055 02/18/20 0331  WBC 12.2* 14.1* 11.7*  RBC 6.86* 5.99* 5.57  HGB 15.8 13.8 12.9*  HCT 51.0 45.3 42.0  MCV 74.3* 75.6* 75.4*  MCH 23.0* 23.0* 23.2*  MCHC 31.0 30.5 30.7  RDW 16.7* 15.3 14.6  PLT 324 316 282   BNPNo results for input(s): BNP, PROBNP in the last 168 hours.  DDimer No results for input(s): DDIMER in the last 168 hours.   Radiology/Studies:  CT ABDOMEN PELVIS W CONTRAST  Result Date: 02/16/2020 CLINICAL DATA:  69 year old male with abdominal pain, nausea vomiting. EXAM: CT ABDOMEN AND PELVIS WITH CONTRAST  TECHNIQUE: Multidetector CT imaging of the abdomen and pelvis was performed using the standard protocol following bolus administration of intravenous contrast. CONTRAST:  167mL OMNIPAQUE IOHEXOL 300 MG/ML  SOLN COMPARISON:  CT abdomen pelvis dated 11/15/2019. FINDINGS: Lower chest: The visualized lung bases are clear. There is coronary vascular calcification primarily involving the left circumflex artery. No intra-abdominal free air or free fluid. Hepatobiliary:  Small left hepatic hypodense lesions are suboptimally characterized but appears similar to prior CT, likely cysts or hemangioma. There is apparent background of fatty infiltration of the liver. No intrahepatic biliary ductal dilatation. No calcified gallstone or pericholecystic fluid. Pancreas: Unremarkable. No pancreatic ductal dilatation or surrounding inflammatory changes. Spleen: Normal in size without focal abnormality. Adrenals/Urinary Tract: The adrenal glands are unremarkable. There is no hydronephrosis on either side. There is symmetric enhancement and excretion of contrast by both kidneys. The visualized ureters appear unremarkable. There is diffuse thickened bladder wall with large bladder diverticula. Findings likely combination of chronic bladder outlet obstruction and infection. Correlation with urinalysis recommended to evaluate for acute cystitis. Stomach/Bowel: There is extensive sigmoid diverticulosis and scattered colonic diverticula without active inflammatory changes. There is loss of fat plane between the sigmoid colon and bladder wall suggestive of adhesion. This can predispose to a colovesical fistula. There is no bowel obstruction or active inflammation. The appendix is normal. Vascular/Lymphatic: Moderate aortoiliac atherosclerotic disease. The IVC is unremarkable. No portal venous gas. There is no adenopathy. Reproductive: The prostate and seminal vesicles are grossly unremarkable. Other: None Musculoskeletal: No acute or significant osseous findings. IMPRESSION: 1. No acute intra-abdominal or pelvic pathology. Extensive colonic diverticulosis. No bowel obstruction or active inflammation. Normal appendix. 2. Diffuse bladder wall thickening and several bladder diverticula. Findings likely represent combination of chronic infection and bladder outlet obstruction. Correlation with urinalysis recommended to exclude active cystitis. 3. Loss of fat plane between sigmoid colon and bladder wall  suggestive of adhesion. This can predispose to a colovesical fistula. 4. Fatty liver. 5. Moderate Aortic Atherosclerosis (ICD10-I70.0). Electronically Signed   By: Anner Crete M.D.   On: 02/16/2020 18:22   ECHOCARDIOGRAM COMPLETE  Result Date: 02/17/2020    ECHOCARDIOGRAM REPORT   Patient Name:   [name] Date of Exam: 02/17/2020 Medical Rec #:  [id number]       Height:       69.0 in Accession #:    [id number]      Weight:       160.0 lb Date of Birth:  November 28, 1951       BSA:          1.88 m Patient Age:    89 years        BP:           190/61 mmHg Patient Gender: M               HR:           83 bpm. Exam Location:  Inpatient Procedure: 2D Echo Indications:    Murmur 785.2 / R01.1  History:        Patient has prior history of Echocardiogram examinations, most                 recent 01/23/2017. CHF, TIA; Risk Factors:Current Smoker,                 Diabetes and Hypertension. Alcohol abuse.  Sonographer:    Vikki Ports Turrentine Referring Phys: [place]  1. The LV is hyperdynamic with LVOT obstruction.  Vmax 3.3 m/s and peak gradient 43 mmHG. There appears to be chordal SAM with minimal anterior mitral valve SAM. There is no characteristic posteriorly directed MR as expected with HOCM. There is mild LVH that is not asymmetric. Overall, this could represent obstruction at the level of the chordal apparatus due to hyperdynamic LV/small LV vs hypertrophic obstructive cardiomyopathy. A cardiac MRI would be helpful to distinguish the pathology. Left ventricular ejection fraction, by estimation, is >75%. The left ventricle has hyperdynamic function. The left ventricle has no regional wall motion abnormalities. There is mild concentric left ventricular hypertrophy. Left ventricular diastolic parameters are consistent with Grade I diastolic dysfunction (impaired relaxation).  2. Right ventricular systolic function is normal. The right ventricular size is normal. Tricuspid regurgitation  signal is inadequate for assessing PA pressure.  3. Left atrial size was mildly dilated.  4. The mitral valve is grossly normal. Trivial mitral valve regurgitation.  5. The aortic valve is tricuspid. Aortic valve regurgitation is mild. No aortic stenosis is present.  6. The inferior vena cava is normal in size with greater than 50% respiratory variability, suggesting right atrial pressure of 3 mmHg. Comparison(s): A prior study was performed on 01/23/2017. Prior images unable to be directly viewed, comparison made by report only. No significant change from prior study. 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 FINDINGS  Left Ventricle: The LV is hyperdynamic with LVOT obstruction. Vmax 3.3 m/s and peak gradient 43 mmHG. There appears to be chordal SAM with minimal anterior mitral valve SAM. There is no characteristic posteriorly directed MR as expected with HOCM. There  is mild LVH that is not asymmetric. Overall, this could represent obstruction at the level of the chordal apparatus due to hyperdynamic LV/small LV vs hypertrophic obstructive cardiomyopathy. A cardiac MRI would be helpful to distinguish the pathology. Left ventricular ejection fraction, by estimation, is >75%. The left ventricle has hyperdynamic function. The left ventricle has no regional wall motion abnormalities. The left ventricular internal cavity size was small. There is mild concentric left ventricular hypertrophy. Left ventricular diastolic parameters are consistent with Grade I diastolic dysfunction (impaired relaxation). Normal left ventricular filling pressure. Right Ventricle: The right ventricular size is normal. No increase in right ventricular wall thickness. Right ventricular systolic function is normal. Tricuspid regurgitation signal is inadequate for assessing PA pressure. Left Atrium: Left atrial size was mildly dilated. Right Atrium: Right atrial size was normal in  size. Pericardium: Trivial pericardial effusion is present. Presence of pericardial fat pad. Mitral Valve: The mitral valve is grossly normal. Trivial mitral valve regurgitation. Tricuspid Valve: The tricuspid valve is grossly normal. Tricuspid valve regurgitation is trivial. Aortic Valve: The aortic valve is tricuspid. Aortic valve regurgitation is mild. No aortic stenosis is present. There is mild calcification of the aortic valve. Pulmonic Valve: The pulmonic valve was grossly normal. Pulmonic valve regurgitation is not visualized. No evidence of pulmonic stenosis. Aorta: The aortic root is normal in size and structure. Venous: The inferior vena cava is normal in size with greater than 50% respiratory variability, suggesting right atrial pressure of 3 mmHg. IAS/Shunts: No atrial level shunt detected by color flow Doppler.  LEFT VENTRICLE PLAX 2D LVIDd:         4.74 cm  Diastology LVIDs:         3.11 cm  LV e' lateral:   5.55 cm/s LV PW:         1.18 cm  LV E/e' lateral: 11.5 LV IVS:  1.36 cm  LV e' medial:    4.65 cm/s LVOT diam:     1.80 cm  LV E/e' medial:  13.7 LV SV Index:   35.13 LVOT Area:     2.54 cm  RIGHT VENTRICLE RV S prime:     19.10 cm/s TAPSE (M-mode): 2.9 cm LEFT ATRIUM             Index       RIGHT ATRIUM           Index LA diam:        4.60 cm 2.45 cm/m  RA Area:     17.40 cm LA Vol (A2C):   82.8 ml 44.06 ml/m RA Volume:   43.70 ml  23.26 ml/m LA Vol (A4C):   65.0 ml 34.59 ml/m LA Biplane Vol: 73.9 ml 39.33 ml/m   AORTA Ao Root diam: 3.10 cm MITRAL VALVE MV Area (PHT): 2.17 cm     SHUNTS MV Decel Time: 349 msec     Systemic Diam: 1.80 cm MV E velocity: 63.90 cm/s MV A velocity: 107.00 cm/s MV E/A ratio:  0.60 Eleonore Chiquito MD Electronically signed by Eleonore Chiquito MD Signature Date/Time: 02/17/2020/1:36:45 PM    Final          Assessment and Plan:   Hypertrophic obstructive cardiomyopathy -Echocardiogram with hyperdynamic contraction, chordal Sam, mild outflow tract  obstruction. -He has not been on a beta-blocker and I do agree that this would be an excellent addition to his antihypertensive regimen to help provide further relaxation and hopeful improvement of his outflow tract obstruction, however he continues to use cocaine.  Therefore, I will give him diltiazem.  I am comfortable with him using both diltiazem and amlodipine. -Try to avoid excessive dehydration which can intensify his outflow tract obstruction and could cause syncope. -It was noted in the echocardiogram that we should obtain a cardiac MRI for further clarification and this would be nice to do.  If we can have this performed during this hospitalization that would be prudent as he has been lost for follow-up previously.  If he is still here on Monday, we can try to obtain this.  However, we can always get this as an outpatient.  I told him this, and he was eager to come back and see me as an outpatient to get this set up if he is ready to go home soon.  Either or is fine with me. -Obviously cocaine use or any hyperadrenergic state can make his outflow tract obstruction worse. -No syncope.  During his hospitalization here, he has not had any ventricular tachycardia.  Consider treadmill as an outpatient.  Cocaine use -Encourage cessation.  Tobacco use -Encourage cessation.  Diabetes with uncontrolled hypertension -Hemoglobin A1c 7.1.  Metformin as outpatient.  Antihypertensives as above.      For questions or updates, please contact [place] Please consult [email or web address] for contact info under     Signed, Candee Furbish, MD  02/18/2020 4:36 PM

## 2020-02-18 NOTE — Progress Notes (Signed)
Patient complained to the night shift nurse that he noted blood in urine during the night, I have messaged the attending doctor , will update day shift nurse and continue to monitor.

## 2020-02-18 NOTE — Plan of Care (Addendum)
  Problem: Education: Goal: Knowledge of General Education information will improve Description: Including pain rating scale, medication(s)/side effects and non-pharmacologic comfort measures Outcome: Progressing   Problem: Clinical Measurements: Goal: Will remain free from infection Outcome: Progressing   Problem: Elimination: Goal: Will not experience complications related to bowel motility Outcome: Progressing Goal: Will not experience complications related to urinary retention Outcome: Progressing   Problem: Pain Managment: Goal: General experience of comfort will improve Outcome: Progressing   Problem: Safety: Goal: Ability to remain free from injury will improve Outcome: Progressing   Problem: Skin Integrity: Goal: Risk for impaired skin integrity will decrease Outcome: Progressing

## 2020-02-18 NOTE — Progress Notes (Signed)
PROGRESS NOTE    [name]  TIR:[id number] DOB: 1951-03-12 DOA: 02/16/2020 PCP: Nolene Ebbs, MD    Brief Narrative:  Patient 69 year old gentleman history of hypertension, diabetes, chronic diastolic CHF, history of CVA, recurrent UTIs, alcohol abuse, polysubstance abuse presented to the ED with a 2-day history of abdominal pain nausea and vomiting with epigastric pain described as squeezing and worse when he applies pressure to it.  Patient also with complaints of dysuria.  Unable to tolerate oral intake.  Unable to take medications.  Noted to be in hypertensive urgency.  Patient seen in the ED CT abdomen and pelvis done with diffuse bladder wall thickening and several bladder diverticula findings likely representing combination of chronic infection and bladder outlet obstruction.  Loss of fat plane between sigmoid colon and bladder wall suggestive of adhesion can predispose to colovesicular fistula.  Urinalysis done with trace leukocytes, nitrite negative, greater than 50 WBCs, triple phosphate crystals noted.  Patient placed empirically on IV Rocephin and pancultured.   Assessment & Plan:   Principal Problem:   Complicated UTI (urinary tract infection) Active Problems:   Diabetes mellitus type 2, controlled (HCC)   Tobacco use disorder   Leukocytosis   Chronic pain   Essential hypertension   Chronic diastolic heart failure (HCC)   Epigastric pain   Hypertensive urgency   Cardiac murmur   Epigastric abdominal pain   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[place])  1 acute complicated UTI Patient had presented with nausea vomiting noted to have a leukocytosis with abdominal pain and inability to keep oral intake down.  CT which was done was negative for perinephric abscess however did show diffuse bladder wall thickening and several bladder diverticula, loss of fat plane between sigmoid colon and bladder wall suggestive of adhesion can predispose to colovesicular fistula.   Urinalysis done concerning for UTI with triple phosphate crystals noted.  Patient improving clinically.  Urine cultures with 70,000 colonies of Corynebacterium diphtheroids.  Blood cultures with no growth to date.  Improving clinically.  Continue IV Rocephin, Pyridium and likely transition to oral antibiotics tomorrow.  Advance to heart healthy diet.  Supportive care.   2.  Hypertensive urgency In the setting of medication noncompliance secondary to nausea and vomiting and inability to keep oral intake down.  Patient tolerating full liquids at this time.  Blood pressure improving.  Continue Norvasc, HCTZ, Isordil.  Increase hydralazine to 75 mg 3 times daily.   3.  Chronic diastolic heart failure Compensated. Continue cardiac regimen of Norvasc, hydralazine, Isordil, HCTZ.  Follow.  4.  Leukocytosis Likely secondary to problem #1.  Urine cultures with 70,000 colonies of Corynebacterium diphtheroids.  Continue IV Rocephin and likely transition to oral antibiotics tomorrow.   5.  Epigastric pain Noted to be worse after taking Aleve.  Patient with some clinical improvement.  Continue Carafate.  Continue PPI.  6.  Cardiac murmur/HOCM--per 2D echo 02/17/2020, 01/23/2017 2D echo with hyperdynamic LV with LVOT obstruction.  Mild LVH that was not asymmetric.  EF of greater than 75%.  No wall motion abnormalities noted.  Mild concentric left ventricular hypertrophy.  Grade 1 diastolic dysfunction.  Mildly dilated left atrial size.  Normal right ventricular systolic function.  HOCM noted on 2D echo from 01/23/2017 however patient left AMA prior to being assessed by cardiology.  Will consult with cardiology for further evaluation and management.  Continue current cardiac regimen of Norvasc, hydralazine, Isordil, HCTZ.  May need to be on a beta-blocker however will defer until patient is  seen by cardiology.  Follow.    7.  Tobacco abuse Tobacco cessation stressed to patient.  Continue nicotine patch.  8.   Polysubstance abuse UDS noted to be positive for cocaine.  Polysubstance cessation stressed to patient.  Social work consult.  9.  History of alcohol abuse Patient did tell admitting physician he had not been drinking for the past 6 years.  Alcohol level negative.  Patient with no signs of withdrawal.  Continue thiamine, folic acid, multivitamin.   10.  Diabetes mellitus type 2 Hemoglobin A1c 7.1.  CBG of 171 this morning.  Continue to hold Metformin.  Continue sliding scale insulin.  Follow.  11.  Chronic pain Stable.  Continue home pain regimen.  12.  Hypokalemia/hypomagnesemia Likely secondary to GI losses.  Potassium at 3.6.  Magnesium at 2.4.  Repeat labs in the morning.   DVT prophylaxis: Lovenox Code Status: Full Family Communication: Updated patient.  No family at bedside. Disposition Plan:  . Patient came from: Home.            . Anticipated d/c place: Home. . Barriers to d/c OR conditions which need to be met to effect a safe d/c: Likely home when clinically improved, tolerating oral intake, finalization of urine cultures and sensitivities, pending evaluation by cardiology for HOCM.Marland Kitchen   Consultants:   None  Procedures:   CT abdomen and pelvis 02/16/2020  2D echo 02/17/2020  Antimicrobials:   IV Rocephin 02/16/2020     Subjective: Patient sitting up in chair eating grits.  States he is tolerating oral intake.  Denies any further nausea or emesis.  States abdominal pain improving.  Dysuria slowly improving.  Did state had some blood in his urine last night however attributes that to his UTI.  States he is having good urine output.   Objective: Vitals:   02/18/20 0314 02/18/20 0744 02/18/20 1207 02/18/20 1420  BP: (!) 138/54 (!) 151/58 (!) 148/64 (!) 164/54  Pulse: 75 89  77  Resp: '17 17  17  ' Temp: 98.8 F (37.1 C) 99.2 F (37.3 C)  98.7 F (37.1 C)  TempSrc: Oral Oral  Oral  SpO2: 97% 99%  100%    Intake/Output Summary (Last 24 hours) at 02/18/2020  1634 Last data filed at 02/17/2020 1841 Gross per 24 hour  Intake 240 ml  Output --  Net 240 ml   There were no vitals filed for this visit.  Examination:  General exam: Appears calm and comfortable  Respiratory system: Lungs clear to auscultation bilaterally.  No wheezes, no crackles, no rhonchi.  Respiratory effort normal. Cardiovascular system: Regular rate rhythm with 3/6 hyperdynamic systolic ejection murmur.  No JVD.  No lower extremity edema.  Gastrointestinal system: Abdomen is soft, nontender, nondistended, positive bowel sounds.  No rebound.  No guarding.  Central nervous system: Alert and oriented. No focal neurological deficits. Extremities: Symmetric 5 x 5 power. Skin: No rashes, lesions or ulcers Psychiatry: Judgement and insight appear normal. Mood & affect appropriate.     Data Reviewed: I have personally reviewed following labs and imaging studies  CBC: Recent Labs  Lab 02/16/20 1455 02/17/20 0055 02/18/20 0331  WBC 12.2* 14.1* 11.7*  NEUTROABS  --   --  6.4  HGB 15.8 13.8 12.9*  HCT 51.0 45.3 42.0  MCV 74.3* 75.6* 75.4*  PLT 324 316 428   Basic Metabolic Panel: Recent Labs  Lab 02/16/20 1455 02/17/20 0055 02/18/20 0331  NA 141 141 140  K 3.8 3.0* 3.6  CL  110 103 106  CO2 20* 20* 21*  GLUCOSE 156* 143* 159*  BUN '21 21 17  ' CREATININE 1.04 1.08 1.05  CALCIUM 9.6 9.5 8.9  MG  --  1.7 2.4  PHOS  --  2.8  --    GFR: CrCl cannot be calculated (Unknown ideal weight.). Liver Function Tests: Recent Labs  Lab 02/16/20 1455 02/17/20 0055  AST 27 21  ALT 26 22  ALKPHOS 62 57  BILITOT 0.8 0.9  PROT 8.0 7.2  ALBUMIN 4.0 3.7   Recent Labs  Lab 02/16/20 1455  LIPASE 23   No results for input(s): AMMONIA in the last 168 hours. Coagulation Profile: No results for input(s): INR, PROTIME in the last 168 hours. Cardiac Enzymes: Recent Labs  Lab 02/16/20 1922  CKTOTAL 61   BNP (last 3 results) No results for input(s): PROBNP in the last  8760 hours. HbA1C: Recent Labs    02/17/20 0051  HGBA1C 7.1*   CBG: Recent Labs  Lab 02/17/20 2332 02/18/20 0454 02/18/20 0810 02/18/20 1158 02/18/20 1553  GLUCAP 140* 169* 171* 112* 212*   Lipid Profile: No results for input(s): CHOL, HDL, LDLCALC, TRIG, CHOLHDL, LDLDIRECT in the last 72 hours. Thyroid Function Tests: Recent Labs    02/17/20 0051  TSH 0.364   Anemia Panel: No results for input(s): VITAMINB12, FOLATE, FERRITIN, TIBC, IRON, RETICCTPCT in the last 72 hours. Sepsis Labs: No results for input(s): PROCALCITON, LATICACIDVEN in the last 168 hours.  Recent Results (from the past 240 hour(s))  Respiratory Panel by RT PCR (Flu A&B, Covid) - Nasopharyngeal Swab     Status: None   Collection Time: 02/16/20  7:47 PM   Specimen: Nasopharyngeal Swab  Result Value Ref Range Status   SARS Coronavirus 2 by RT PCR NEGATIVE NEGATIVE Final    Comment: (NOTE) SARS-CoV-2 target nucleic acids are NOT DETECTED. The SARS-CoV-2 RNA is generally detectable in upper respiratoy specimens during the acute phase of infection. The lowest concentration of SARS-CoV-2 viral copies this assay can detect is 131 copies/mL. A negative result does not preclude SARS-Cov-2 infection and should not be used as the sole basis for treatment or other patient management decisions. A negative result may occur with  improper specimen collection/handling, submission of specimen other than nasopharyngeal swab, presence of viral mutation(s) within the areas targeted by this assay, and inadequate number of viral copies (<131 copies/mL). A negative result must be combined with clinical observations, patient history, and epidemiological information. The expected result is Negative. Fact Sheet for Patients:  PinkCheek.be Fact Sheet for Healthcare Providers:  GravelBags.it This test is not yet ap proved or cleared by the [place] FDA and  has  been authorized for detection and/or diagnosis of SARS-CoV-2 by FDA under an Emergency Use Authorization (EUA). This EUA will remain  in effect (meaning this test can be used) for the duration of the COVID-19 declaration under Section 564(b)(1) of the Act, 21 U.S.C. section 360bbb-3(b)(1), unless the authorization is terminated or revoked sooner.    Influenza A by PCR NEGATIVE NEGATIVE Final   Influenza B by PCR NEGATIVE NEGATIVE Final    Comment: (NOTE) The Xpert Xpress SARS-CoV-2/FLU/RSV assay is intended as an aid in  the diagnosis of influenza from Nasopharyngeal swab specimens and  should not be used as a sole basis for treatment. Nasal washings and  aspirates are unacceptable for Xpert Xpress SARS-CoV-2/FLU/RSV  testing. Fact Sheet for Patients: PinkCheek.be Fact Sheet for Healthcare Providers: GravelBags.it This test is not yet approved  or cleared by the [place] and  has been authorized for detection and/or diagnosis of SARS-CoV-2 by  FDA under an Emergency Use Authorization (EUA). This EUA will remain  in effect (meaning this test can be used) for the duration of the  Covid-19 declaration under Section 564(b)(1) of the Act, 21  U.S.C. section 360bbb-3(b)(1), unless the authorization is  terminated or revoked. Performed at [facility], [place] [address] [place], [place] [address]   Culture, Urine     Status: Abnormal   Collection Time: 02/16/20  9:10 PM   Specimen: Urine, Clean Catch  Result Value Ref Range Status   Specimen Description URINE, CLEAN CATCH  Final   Special Requests NONE  Final   Culture (A)  Final    70,000 COLONIES/mL DIPHTHEROIDS(CORYNEBACTERIUM SPECIES) Standardized susceptibility testing for this organism is not available. Performed at [facility], [place] [address], [place], [place] [address]    Report Status 02/17/2020 FINAL  Final  Culture, blood (Routine X 2) w Reflex to  ID Panel     Status: None (Preliminary result)   Collection Time: 02/17/20 10:30 AM   Specimen: BLOOD RIGHT ARM  Result Value Ref Range Status   Specimen Description BLOOD RIGHT ARM  Final   Special Requests   Final    BOTTLES DRAWN AEROBIC AND ANAEROBIC Blood Culture adequate volume Performed at [facility], [place] [address] [place], [place] [address]    Culture NO GROWTH < 24 HOURS  Final   Report Status PENDING  Incomplete  Culture, blood (Routine X 2) w Reflex to ID Panel     Status: None (Preliminary result)   Collection Time: 02/17/20 10:35 AM   Specimen: BLOOD RIGHT HAND  Result Value Ref Range Status   Specimen Description BLOOD RIGHT HAND  Final   Special Requests   Final    BOTTLES DRAWN AEROBIC AND ANAEROBIC Blood Culture adequate volume Performed at [facility], [place] [address] [place], [place] [address]    Culture NO GROWTH < 24 HOURS  Final   Report Status PENDING  Incomplete         Radiology Studies: CT ABDOMEN PELVIS W CONTRAST  Result Date: 02/16/2020 CLINICAL DATA:  69 year old male with abdominal pain, nausea vomiting. EXAM: CT ABDOMEN AND PELVIS WITH CONTRAST TECHNIQUE: Multidetector CT imaging of the abdomen and pelvis was performed using the standard protocol following bolus administration of intravenous contrast. CONTRAST:  130m OMNIPAQUE IOHEXOL 300 MG/ML  SOLN COMPARISON:  CT abdomen pelvis dated 11/15/2019. FINDINGS: Lower chest: The visualized lung bases are clear. There is coronary vascular calcification primarily involving the left circumflex artery. No intra-abdominal free air or free fluid. Hepatobiliary: Small left hepatic hypodense lesions are suboptimally characterized but appears similar to prior CT, likely cysts or hemangioma. There is apparent background of fatty infiltration of the liver. No intrahepatic biliary ductal dilatation. No calcified gallstone or pericholecystic fluid. Pancreas: Unremarkable. No pancreatic ductal dilatation  or surrounding inflammatory changes. Spleen: Normal in size without focal abnormality. Adrenals/Urinary Tract: The adrenal glands are unremarkable. There is no hydronephrosis on either side. There is symmetric enhancement and excretion of contrast by both kidneys. The visualized ureters appear unremarkable. There is diffuse thickened bladder wall with large bladder diverticula. Findings likely combination of chronic bladder outlet obstruction and infection. Correlation with urinalysis recommended to evaluate for acute cystitis. Stomach/Bowel: There is extensive sigmoid diverticulosis and scattered colonic diverticula without active inflammatory changes. There is loss of  fat plane between the sigmoid colon and bladder wall suggestive of adhesion. This can predispose to a colovesical fistula. There is no bowel obstruction or active inflammation. The appendix is normal. Vascular/Lymphatic: Moderate aortoiliac atherosclerotic disease. The IVC is unremarkable. No portal venous gas. There is no adenopathy. Reproductive: The prostate and seminal vesicles are grossly unremarkable. Other: None Musculoskeletal: No acute or significant osseous findings. IMPRESSION: 1. No acute intra-abdominal or pelvic pathology. Extensive colonic diverticulosis. No bowel obstruction or active inflammation. Normal appendix. 2. Diffuse bladder wall thickening and several bladder diverticula. Findings likely represent combination of chronic infection and bladder outlet obstruction. Correlation with urinalysis recommended to exclude active cystitis. 3. Loss of fat plane between sigmoid colon and bladder wall suggestive of adhesion. This can predispose to a colovesical fistula. 4. Fatty liver. 5. Moderate Aortic Atherosclerosis (ICD10-I70.0). Electronically Signed   By: Anner Crete M.D.   On: 02/16/2020 18:22   ECHOCARDIOGRAM COMPLETE  Result Date: 02/17/2020    ECHOCARDIOGRAM REPORT   Patient Name:   [name] Date of Exam:  02/17/2020 Medical Rec #:  [id number]       Height:       69.0 in Accession #:    [id number]      Weight:       160.0 lb Date of Birth:  01/20/1951       BSA:          1.88 m Patient Age:    17 years        BP:           190/61 mmHg Patient Gender: M               HR:           83 bpm. Exam Location:  Inpatient Procedure: 2D Echo Indications:    Murmur 785.2 / R01.1  History:        Patient has prior history of Echocardiogram examinations, most                 recent 01/23/2017. CHF, TIA; Risk Factors:Current Smoker,                 Diabetes and Hypertension. Alcohol abuse.  Sonographer:    Vikki Ports Turrentine Referring Phys: [place]  1. The LV is hyperdynamic with LVOT obstruction. Vmax 3.3 m/s and peak gradient 43 mmHG. There appears to be chordal SAM with minimal anterior mitral valve SAM. There is no characteristic posteriorly directed MR as expected with HOCM. There is mild LVH that is not asymmetric. Overall, this could represent obstruction at the level of the chordal apparatus due to hyperdynamic LV/small LV vs hypertrophic obstructive cardiomyopathy. A cardiac MRI would be helpful to distinguish the pathology. Left ventricular ejection fraction, by estimation, is >75%. The left ventricle has hyperdynamic function. The left ventricle has no regional wall motion abnormalities. There is mild concentric left ventricular hypertrophy. Left ventricular diastolic parameters are consistent with Grade I diastolic dysfunction (impaired relaxation).  2. Right ventricular systolic function is normal. The right ventricular size is normal. Tricuspid regurgitation signal is inadequate for assessing PA pressure.  3. Left atrial size was mildly dilated.  4. The mitral valve is grossly normal. Trivial mitral valve regurgitation.  5. The aortic valve is tricuspid. Aortic valve regurgitation is mild. No aortic stenosis is present.  6. The inferior vena cava is normal in size with greater than 50%  respiratory variability, suggesting right atrial pressure of 3 mmHg. Comparison(s): A  prior study was performed on 01/23/2017. Prior images unable to be directly viewed, comparison made by report only. No significant change from prior study. 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 FINDINGS  Left Ventricle: The LV is hyperdynamic with LVOT obstruction. Vmax 3.3 m/s and peak gradient 43 mmHG. There appears to be chordal SAM with minimal anterior mitral valve SAM. There is no characteristic posteriorly directed MR as expected with HOCM. There  is mild LVH that is not asymmetric. Overall, this could represent obstruction at the level of the chordal apparatus due to hyperdynamic LV/small LV vs hypertrophic obstructive cardiomyopathy. A cardiac MRI would be helpful to distinguish the pathology. Left ventricular ejection fraction, by estimation, is >75%. The left ventricle has hyperdynamic function. The left ventricle has no regional wall motion abnormalities. The left ventricular internal cavity size was small. There is mild concentric left ventricular hypertrophy. Left ventricular diastolic parameters are consistent with Grade I diastolic dysfunction (impaired relaxation). Normal left ventricular filling pressure. Right Ventricle: The right ventricular size is normal. No increase in right ventricular wall thickness. Right ventricular systolic function is normal. Tricuspid regurgitation signal is inadequate for assessing PA pressure. Left Atrium: Left atrial size was mildly dilated. Right Atrium: Right atrial size was normal in size. Pericardium: Trivial pericardial effusion is present. Presence of pericardial fat pad. Mitral Valve: The mitral valve is grossly normal. Trivial mitral valve regurgitation. Tricuspid Valve: The tricuspid valve is grossly normal. Tricuspid valve regurgitation is trivial. Aortic Valve: The aortic valve is tricuspid. Aortic  valve regurgitation is mild. No aortic stenosis is present. There is mild calcification of the aortic valve. Pulmonic Valve: The pulmonic valve was grossly normal. Pulmonic valve regurgitation is not visualized. No evidence of pulmonic stenosis. Aorta: The aortic root is normal in size and structure. Venous: The inferior vena cava is normal in size with greater than 50% respiratory variability, suggesting right atrial pressure of 3 mmHg. IAS/Shunts: No atrial level shunt detected by color flow Doppler.  LEFT VENTRICLE PLAX 2D LVIDd:         4.74 cm  Diastology LVIDs:         3.11 cm  LV e' lateral:   5.55 cm/s LV PW:         1.18 cm  LV E/e' lateral: 11.5 LV IVS:        1.36 cm  LV e' medial:    4.65 cm/s LVOT diam:     1.80 cm  LV E/e' medial:  13.7 LV SV Index:   35.13 LVOT Area:     2.54 cm  RIGHT VENTRICLE RV S prime:     19.10 cm/s TAPSE (M-mode): 2.9 cm LEFT ATRIUM             Index       RIGHT ATRIUM           Index LA diam:        4.60 cm 2.45 cm/m  RA Area:     17.40 cm LA Vol (A2C):   82.8 ml 44.06 ml/m RA Volume:   43.70 ml  23.26 ml/m LA Vol (A4C):   65.0 ml 34.59 ml/m LA Biplane Vol: 73.9 ml 39.33 ml/m   AORTA Ao Root diam: 3.10 cm MITRAL VALVE MV Area (PHT): 2.17 cm     SHUNTS MV Decel Time: 349 msec     Systemic Diam: 1.80 cm MV E velocity: 63.90 cm/s MV A velocity: 107.00 cm/s MV E/A ratio:  0.60 [place]  O'Neal MD Electronically signed by Eleonore Chiquito MD Signature Date/Time: 02/17/2020/1:36:45 PM    Final         Scheduled Meds: . amLODipine  10 mg Oral Daily  . aspirin EC  81 mg Oral Daily  . enoxaparin (LOVENOX) injection  30 mg Subcutaneous Q24H  . folic acid  1 mg Oral Daily  . gabapentin  100 mg Oral TID  . hydrALAZINE  50 mg Oral Q8H  . hydrochlorothiazide  12.5 mg Oral Daily  . insulin aspart  0-15 Units Subcutaneous Q4H  . isosorbide dinitrate  10 mg Oral TID  . multivitamin with minerals  1 tablet Oral Daily  . nicotine  21 mg Transdermal Daily  . pantoprazole  40  mg Oral Daily  . phenazopyridine  200 mg Oral TID WC  . pravastatin  20 mg Oral QHS  . sodium chloride flush  3 mL Intravenous Once  . sucralfate  1 g Oral TID WC & HS  . thiamine  100 mg Oral Daily   Continuous Infusions: . cefTRIAXone (ROCEPHIN)  IV 1 g (02/17/20 2154)     LOS: 0 days    Time spent: 40 minutes    Irine Seal, MD [facility]   To contact the attending provider between 7A-7P or the covering provider during after hours 7P-7A, please log into the web site [email or web address] and access using universal [place] password for that web site. If you do not have the password, please call the hospital operator.  02/18/2020, 4:34 PM

## 2020-02-18 NOTE — Plan of Care (Signed)

## 2020-02-19 DIAGNOSIS — I421 Obstructive hypertrophic cardiomyopathy: Secondary | ICD-10-CM | POA: Diagnosis present

## 2020-02-19 DIAGNOSIS — E785 Hyperlipidemia, unspecified: Secondary | ICD-10-CM | POA: Diagnosis present

## 2020-02-19 DIAGNOSIS — I110 Hypertensive heart disease with heart failure: Secondary | ICD-10-CM | POA: Diagnosis present

## 2020-02-19 DIAGNOSIS — I5032 Chronic diastolic (congestive) heart failure: Secondary | ICD-10-CM

## 2020-02-19 DIAGNOSIS — N400 Enlarged prostate without lower urinary tract symptoms: Secondary | ICD-10-CM | POA: Diagnosis present

## 2020-02-19 DIAGNOSIS — R1013 Epigastric pain: Secondary | ICD-10-CM | POA: Diagnosis present

## 2020-02-19 DIAGNOSIS — E119 Type 2 diabetes mellitus without complications: Secondary | ICD-10-CM | POA: Diagnosis present

## 2020-02-19 DIAGNOSIS — R011 Cardiac murmur, unspecified: Secondary | ICD-10-CM | POA: Diagnosis not present

## 2020-02-19 DIAGNOSIS — F1410 Cocaine abuse, uncomplicated: Secondary | ICD-10-CM | POA: Diagnosis not present

## 2020-02-19 DIAGNOSIS — E876 Hypokalemia: Secondary | ICD-10-CM | POA: Diagnosis not present

## 2020-02-19 DIAGNOSIS — G8929 Other chronic pain: Secondary | ICD-10-CM | POA: Diagnosis present

## 2020-02-19 DIAGNOSIS — E860 Dehydration: Secondary | ICD-10-CM | POA: Diagnosis present

## 2020-02-19 DIAGNOSIS — K0889 Other specified disorders of teeth and supporting structures: Secondary | ICD-10-CM | POA: Diagnosis present

## 2020-02-19 DIAGNOSIS — Z8673 Personal history of transient ischemic attack (TIA), and cerebral infarction without residual deficits: Secondary | ICD-10-CM | POA: Diagnosis not present

## 2020-02-19 DIAGNOSIS — I160 Hypertensive urgency: Secondary | ICD-10-CM | POA: Diagnosis present

## 2020-02-19 DIAGNOSIS — N390 Urinary tract infection, site not specified: Secondary | ICD-10-CM | POA: Diagnosis not present

## 2020-02-19 DIAGNOSIS — Z86018 Personal history of other benign neoplasm: Secondary | ICD-10-CM | POA: Diagnosis not present

## 2020-02-19 DIAGNOSIS — N323 Diverticulum of bladder: Secondary | ICD-10-CM | POA: Diagnosis present

## 2020-02-19 DIAGNOSIS — Z20822 Contact with and (suspected) exposure to covid-19: Secondary | ICD-10-CM | POA: Diagnosis present

## 2020-02-19 DIAGNOSIS — N10 Acute tubulo-interstitial nephritis: Secondary | ICD-10-CM | POA: Diagnosis present

## 2020-02-19 DIAGNOSIS — B9689 Other specified bacterial agents as the cause of diseases classified elsewhere: Secondary | ICD-10-CM | POA: Diagnosis present

## 2020-02-19 DIAGNOSIS — Z87440 Personal history of urinary (tract) infections: Secondary | ICD-10-CM | POA: Diagnosis not present

## 2020-02-19 DIAGNOSIS — F17210 Nicotine dependence, cigarettes, uncomplicated: Secondary | ICD-10-CM | POA: Diagnosis not present

## 2020-02-19 DIAGNOSIS — N320 Bladder-neck obstruction: Secondary | ICD-10-CM | POA: Diagnosis present

## 2020-02-19 DIAGNOSIS — F1021 Alcohol dependence, in remission: Secondary | ICD-10-CM | POA: Diagnosis not present

## 2020-02-19 DIAGNOSIS — I471 Supraventricular tachycardia: Secondary | ICD-10-CM | POA: Diagnosis not present

## 2020-02-19 LAB — GLUCOSE, CAPILLARY
Glucose-Capillary: 101 mg/dL — ABNORMAL HIGH (ref 70–99)
Glucose-Capillary: 117 mg/dL — ABNORMAL HIGH (ref 70–99)
Glucose-Capillary: 149 mg/dL — ABNORMAL HIGH (ref 70–99)
Glucose-Capillary: 191 mg/dL — ABNORMAL HIGH (ref 70–99)
Glucose-Capillary: 208 mg/dL — ABNORMAL HIGH (ref 70–99)
Glucose-Capillary: 276 mg/dL — ABNORMAL HIGH (ref 70–99)

## 2020-02-19 LAB — CBC
HCT: 39.7 % (ref 39.0–52.0)
Hemoglobin: 12.4 g/dL — ABNORMAL LOW (ref 13.0–17.0)
MCH: 23.3 pg — ABNORMAL LOW (ref 26.0–34.0)
MCHC: 31.2 g/dL (ref 30.0–36.0)
MCV: 74.5 fL — ABNORMAL LOW (ref 80.0–100.0)
Platelets: 270 10*3/uL (ref 150–400)
RBC: 5.33 MIL/uL (ref 4.22–5.81)
RDW: 14.6 % (ref 11.5–15.5)
WBC: 11.2 10*3/uL — ABNORMAL HIGH (ref 4.0–10.5)
nRBC: 0 % (ref 0.0–0.2)

## 2020-02-19 LAB — BASIC METABOLIC PANEL
Anion gap: 12 (ref 5–15)
BUN: 14 mg/dL (ref 8–23)
CO2: 22 mmol/L (ref 22–32)
Calcium: 9.1 mg/dL (ref 8.9–10.3)
Chloride: 104 mmol/L (ref 98–111)
Creatinine, Ser: 1.19 mg/dL (ref 0.61–1.24)
GFR calc Af Amer: >60 mL/min (ref >60)
GFR calc non Af Amer: >60 mL/min (ref >60)
Glucose, Bld: 117 mg/dL — ABNORMAL HIGH (ref 70–99)
Potassium: 3.6 mmol/L (ref 3.5–5.1)
Sodium: 138 mmol/L (ref 135–145)

## 2020-02-19 LAB — URINE CULTURE: Culture: NO GROWTH

## 2020-02-19 LAB — MAGNESIUM: Magnesium: 2 mg/dL (ref 1.7–2.4)

## 2020-02-19 MED ORDER — SODIUM CHLORIDE 0.9 % IV BOLUS
500.0000 mL | Freq: Once | INTRAVENOUS | Status: AC
  Administered 2020-02-19: 500 mL via INTRAVENOUS

## 2020-02-19 NOTE — Progress Notes (Signed)
PROGRESS NOTE    [name]  WER:[id number] DOB: 1950/12/31 DOA: 02/16/2020 PCP: Nolene Ebbs, MD    Brief Narrative:  Patient 69 year old gentleman history of hypertension, diabetes, chronic diastolic CHF, history of CVA, recurrent UTIs, alcohol abuse, polysubstance abuse presented to the ED with a 2-day history of abdominal pain nausea and vomiting with epigastric pain described as squeezing and worse when he applies pressure to it.  Patient also with complaints of dysuria.  Unable to tolerate oral intake.  Unable to take medications.  Noted to be in hypertensive urgency.  Patient seen in the ED CT abdomen and pelvis done with diffuse bladder wall thickening and several bladder diverticula findings likely representing combination of chronic infection and bladder outlet obstruction.  Loss of fat plane between sigmoid colon and bladder wall suggestive of adhesion can predispose to colovesicular fistula.  Urinalysis done with trace leukocytes, nitrite negative, greater than 50 WBCs, triple phosphate crystals noted.  Patient placed empirically on IV Rocephin and pancultured.   Assessment & Plan:   Principal Problem:   Complicated UTI (urinary tract infection) Active Problems:   Diabetes mellitus type 2, controlled (HCC)   Tobacco use disorder   Leukocytosis   Chronic pain   Essential hypertension   Chronic diastolic heart failure (HCC)   Epigastric pain   Hypertensive urgency   Cardiac murmur   Epigastric abdominal pain   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[place])  1 acute complicated UTI Patient had presented with nausea vomiting noted to have a leukocytosis with abdominal pain and inability to keep oral intake down.  CT which was done was negative for perinephric abscess however did show diffuse bladder wall thickening and several bladder diverticula, loss of fat plane between sigmoid colon and bladder wall suggestive of adhesion can predispose to colovesicular fistula.   Urinalysis done concerning for UTI with triple phosphate crystals noted.  Patient improving clinically.  Urine cultures with 70,000 colonies of Corynebacterium diphtheroids.  Blood cultures with no growth to date.  Transition from IV Rocephin to oral amoxicillin to complete a 5 to 7-day course of antibiotic treatment.  Continue Pyridium.  Supportive care.   2.  Hypertensive urgency In the setting of medication noncompliance secondary to nausea and vomiting and inability to keep oral intake down.  Patient tolerating solid diet.  Blood pressure improved on current regimen.  Continue Norvasc, HCTZ, hydralazine.  Cardizem added to regimen per cardiology and Isordil discontinued.  Outpatient follow-up.   3.  Chronic diastolic heart failure Compensated. Continue cardiac regimen of Norvasc, hydralazine, HCTZ, Cardizem.  Isordil discontinued as patient noted to have some lightheadedness palpitations and diastolic blood pressures in the 40s.  Cardiology following.  Follow.  4.  Leukocytosis Likely secondary to problem #1.  Urine cultures with 70,000 colonies of Corynebacterium diphtheroids.  Transition from IV Rocephin to oral amoxicillin.  Follow.   5.  Epigastric pain Noted to be worse after taking Aleve.  Patient with some clinical improvement.  Continue Carafate.  Continue PPI.  6.  Cardiac murmur/HOCM--per 2D echo 02/17/2020, 01/23/2017 2D echo with hyperdynamic LV with LVOT obstruction.  Mild LVH that was not asymmetric.  EF of greater than 75%.  No wall motion abnormalities noted.  Mild concentric left ventricular hypertrophy.  Grade 1 diastolic dysfunction.  Mildly dilated left atrial size.  Normal right ventricular systolic function.  HOCM noted on 2D echo from 01/23/2017 however patient left AMA prior to being assessed by cardiology.  Patient seen in consultation by cardiology and Cardizem added  to patient's regimen.  Patient unable to be placed on beta-blocker due to history of cocaine use.  Patient  noted to have some palpitations and lightheadedness after being given some nitroglycerin this morning and noted to have a burst of SVT.  Cardiology discontinued nitroglycerin and patient given a bolus of normal saline as patient also noted to have diastolic blood pressures in the 40s.  Cardiology recommending cardiac MRI which will likely be obtained tomorrow 02/20/2020.  Continue current regimen of hydralazine, Norvasc, Cardizem, HCTZ.  Cardiology following and appreciate input and recommendations.  7.  Tobacco abuse Tobacco cessation stressed to patient.  Continue nicotine patch.  8.  Polysubstance abuse UDS noted to be positive for cocaine.  Polysubstance cessation stressed to patient.  Patient seems to be motivated for polysubstance cessation in light of his abnormal 2D echo.  Social work consult.  9.  History of alcohol abuse Patient did tell admitting physician he had not been drinking for the past 6 years.  Alcohol level negative.  Patient with no signs of withdrawal.  Continue thiamine, folic acid, multivitamin.   10.  Diabetes mellitus type 2 Hemoglobin A1c 7.1.  CBG of 276 this morning.  Continue to hold Metformin.  Continue sliding scale insulin.  Follow.  11.  Chronic pain Stable.  Continue home pain regimen.  12.  Hypokalemia/hypomagnesemia Likely secondary to GI losses.  Potassium at 3.6.  Magnesium at 2.0.  Follow.    DVT prophylaxis: Lovenox Code Status: Full Family Communication: Updated patient.  No family at bedside. Disposition Plan:   Patient came from: Home.             Anticipated d/c place: Home.  Barriers to d/c OR conditions which need to be met to effect a safe d/c: Likely home when clinically improved, tolerating oral intake, finalization of urine cultures and sensitivities, improvement with palpitations, lightheadedness and clearance by cardiology.  Patient with HOCM and due to symptoms of lightheadedness, palpitations and feeling poorly likely not safe to be  discharged today.    Consultants:   Cardiology: Dr. Marlou Porch 02/18/2020  Procedures:   CT abdomen and pelvis 02/16/2020  2D echo 02/17/2020  Antimicrobials:   IV Rocephin 02/16/2020>>>>> 02/19/2020  Amoxicillin to 21 2021     Subjective: Patient states did not feel too well this morning.  Patient stated had some palpitations, lightheadedness after taking nitroglycerin.  Patient stated he felt poorly.  Patient receiving a bolus of IV fluids.  Patient also noted to have diastolic blood pressures in the 40s.  Patient denies any chest pain.  States dysuria slowly improving.  States abdominal pain has improved.  Denies any further hematuria.  Patient also noted to have a run of SVT this morning.  Objective: Vitals:   02/19/20 0915 02/19/20 1218 02/19/20 1253 02/19/20 1423  BP: (!) 141/45 (!) 152/47 (!) 148/45   Pulse: 69 66 97   Resp: 18 15    Temp: 98.9 F (37.2 C) 98.3 F (36.8 C)    TempSrc: Oral Oral  Oral  SpO2: 99% 97% 98%   Weight:    75.8 kg  Height:    '5\' 9"'$  (1.753 m)    Intake/Output Summary (Last 24 hours) at 02/19/2020 1425 Last data filed at 02/19/2020 1359 Gross per 24 hour  Intake 982.03 ml  Output --  Net 982.03 ml   Filed Weights   02/19/20 1423  Weight: 75.8 kg    Examination:  General exam: Appears calm and comfortable  Respiratory system: Lungs  clear to auscultation bilaterally.  No wheezes, no crackles, no rhonchi.  Respiratory effort normal. Cardiovascular system: Regular rate rhythm with 3/6 hyperdynamic systolic ejection murmur.  No JVD.  No lower extremity edema.  Gastrointestinal system: Abdomen is soft, nontender, nondistended, positive bowel sounds.  No rebound.  No guarding.  Central nervous system: Alert and oriented. No focal neurological deficits. Extremities: Symmetric 5 x 5 power. Skin: No rashes, lesions or ulcers Psychiatry: Judgement and insight appear normal. Mood & affect appropriate.     Data Reviewed: I have personally  reviewed following labs and imaging studies  CBC: Recent Labs  Lab 02/16/20 1455 02/17/20 0055 02/18/20 0331 02/19/20 0341  WBC 12.2* 14.1* 11.7* 11.2*  NEUTROABS  --   --  6.4  --   HGB 15.8 13.8 12.9* 12.4*  HCT 51.0 45.3 42.0 39.7  MCV 74.3* 75.6* 75.4* 74.5*  PLT 324 316 282 874   Basic Metabolic Panel: Recent Labs  Lab 02/16/20 1455 02/17/20 0055 02/18/20 0331 02/19/20 0341  NA 141 141 140 138  K 3.8 3.0* 3.6 3.6  CL 110 103 106 104  CO2 20* 20* 21* 22  GLUCOSE 156* 143* 159* 117*  BUN _0 CREATININE 1.04 1.08 1.05 1.19  CALCIUM 9.6 9.5 8.9 9.1  MG  --  1.7 2.4 2.0  PHOS  --  2.8  --   --    GFR: Estimated Creatinine Clearance: 60.2 mL/min (by C-G formula based on SCr of 1.19 mg/dL). Liver Function Tests: Recent Labs  Lab 02/16/20 1455 02/17/20 0055  AST 27 21  ALT 26 22  ALKPHOS 62 57  BILITOT 0.8 0.9  PROT 8.0 7.2  ALBUMIN 4.0 3.7   Recent Labs  Lab 02/16/20 1455  LIPASE 23   No results for input(s): AMMONIA in the last 168 hours. Coagulation Profile: No results for input(s): INR, PROTIME in the last 168 hours. Cardiac Enzymes: Recent Labs  Lab 02/16/20 1922  CKTOTAL 61   BNP (last 3 results) No results for input(s): PROBNP in the last 8760 hours. HbA1C: Recent Labs    02/17/20 0051  HGBA1C 7.1*   CBG: Recent Labs  Lab 02/18/20 1938 02/18/20 2352 02/19/20 0341 02/19/20 0801 02/19/20 1212  GLUCAP 132* 170* 117* 276* 191*   Lipid Profile: No results for input(s): CHOL, HDL, LDLCALC, TRIG, CHOLHDL, LDLDIRECT in the last 72 hours. Thyroid Function Tests: Recent Labs    02/17/20 0051  TSH 0.364   Anemia Panel: No results for input(s): VITAMINB12, FOLATE, FERRITIN, TIBC, IRON, RETICCTPCT in the last 72 hours. Sepsis Labs: No results for input(s): PROCALCITON, LATICACIDVEN in the last 168 hours.  Recent Results (from the past 240 hour(s))  Respiratory Panel by RT PCR (Flu A&B, Covid) - Nasopharyngeal Swab      Status: None   Collection Time: 02/16/20  7:47 PM   Specimen: Nasopharyngeal Swab  Result Value Ref Range Status   SARS Coronavirus 2 by RT PCR NEGATIVE NEGATIVE Final    Comment: (NOTE) SARS-CoV-2 target nucleic acids are NOT DETECTED. The SARS-CoV-2 RNA is generally detectable in upper respiratoy specimens during the acute phase of infection. The lowest concentration of SARS-CoV-2 viral copies this assay can detect is 131 copies/mL. A negative result does not preclude SARS-Cov-2 infection and should not be used as the sole basis for treatment or other patient management decisions. A negative result may occur with  improper specimen collection/handling, submission of specimen other than nasopharyngeal swab, presence of viral mutation(s) within  the areas targeted by this assay, and inadequate number of viral copies (<131 copies/mL). A negative result must be combined with clinical observations, patient history, and epidemiological information. The expected result is Negative. Fact Sheet for Patients:  PinkCheek.be Fact Sheet for Healthcare Providers:  GravelBags.it This test is not yet ap proved or cleared by the [place] FDA and  has been authorized for detection and/or diagnosis of SARS-CoV-2 by FDA under an Emergency Use Authorization (EUA). This EUA will remain  in effect (meaning this test can be used) for the duration of the COVID-19 declaration under Section 564(b)(1) of the Act, 21 U.S.C. section 360bbb-3(b)(1), unless the authorization is terminated or revoked sooner.    Influenza A by PCR NEGATIVE NEGATIVE Final   Influenza B by PCR NEGATIVE NEGATIVE Final    Comment: (NOTE) The Xpert Xpress SARS-CoV-2/FLU/RSV assay is intended as an aid in  the diagnosis of influenza from Nasopharyngeal swab specimens and  should not be used as a sole basis for treatment. Nasal washings and  aspirates are unacceptable for  Xpert Xpress SARS-CoV-2/FLU/RSV  testing. Fact Sheet for Patients: PinkCheek.be Fact Sheet for Healthcare Providers: GravelBags.it This test is not yet approved or cleared by the [place] FDA and  has been authorized for detection and/or diagnosis of SARS-CoV-2 by  FDA under an Emergency Use Authorization (EUA). This EUA will remain  in effect (meaning this test can be used) for the duration of the  Covid-19 declaration under Section 564(b)(1) of the Act, 21  U.S.C. section 360bbb-3(b)(1), unless the authorization is  terminated or revoked. Performed at [facility], [place] [address] [place], [place] [address]   Culture, Urine     Status: Abnormal   Collection Time: 02/16/20  9:10 PM   Specimen: Urine, Clean Catch  Result Value Ref Range Status   Specimen Description URINE, CLEAN CATCH  Final   Special Requests NONE  Final   Culture (A)  Final    70,000 COLONIES/mL DIPHTHEROIDS(CORYNEBACTERIUM SPECIES) Standardized susceptibility testing for this organism is not available. Performed at [facility], [place] [address] [place], [place] [address]    Report Status 02/17/2020 FINAL  Final  Culture, blood (Routine X 2) w Reflex to ID Panel     Status: None (Preliminary result)   Collection Time: 02/17/20 10:30 AM   Specimen: BLOOD RIGHT ARM  Result Value Ref Range Status   Specimen Description BLOOD RIGHT ARM  Final   Special Requests   Final    BOTTLES DRAWN AEROBIC AND ANAEROBIC Blood Culture adequate volume Performed at [facility], [place] [address], [place], [place] [address]    Culture NO GROWTH < 24 HOURS  Final   Report Status PENDING  Incomplete  Culture, blood (Routine X 2) w Reflex to ID Panel     Status: None (Preliminary result)   Collection Time: 02/17/20 10:35 AM   Specimen: BLOOD RIGHT HAND  Result Value Ref Range Status   Specimen Description BLOOD RIGHT HAND  Final   Special Requests    Final    BOTTLES DRAWN AEROBIC AND ANAEROBIC Blood Culture adequate volume Performed at [facility], [place] [address], [place], [place] [address]    Culture NO GROWTH < 24 HOURS  Final   Report Status PENDING  Incomplete  Culture, Urine     Status: None   Collection Time: 02/18/20  2:24 PM   Specimen: Urine, Random  Result Value Ref Range Status   Specimen  Description URINE, RANDOM  Final   Special Requests NONE  Final   Culture   Final    NO GROWTH Performed at [facility], [place] [address] [place], [place] [address]    Report Status 02/19/2020 FINAL  Final         Radiology Studies: No results found.      Scheduled Meds:  amLODipine  10 mg Oral Daily   aspirin EC  81 mg Oral Daily   diltiazem  240 mg Oral Daily   enoxaparin (LOVENOX) injection  30 mg Subcutaneous I09B   folic acid  1 mg Oral Daily   gabapentin  100 mg Oral TID   hydrALAZINE  75 mg Oral Q8H   hydrochlorothiazide  12.5 mg Oral Daily   insulin aspart  0-15 Units Subcutaneous Q4H   multivitamin with minerals  1 tablet Oral Daily   nicotine  21 mg Transdermal Daily   pantoprazole  40 mg Oral Daily   phenazopyridine  200 mg Oral TID WC   pravastatin  20 mg Oral QHS   sodium chloride flush  3 mL Intravenous Once   sucralfate  1 g Oral TID WC & HS   thiamine  100 mg Oral Daily   Continuous Infusions:  cefTRIAXone (ROCEPHIN)  IV 1 g (02/18/20 2059)     LOS: 0 days    Time spent: 40 minutes    Irine Seal, MD [facility]   To contact the attending provider between 7A-7P or the covering provider during after hours 7P-7A, please log into the web site [email or web address] and access using universal [place] password for that web site. If you do not have the password, please call the hospital operator.  02/19/2020, 2:25 PM

## 2020-02-19 NOTE — Progress Notes (Signed)
Progress Note  Patient Name: [name] Date of Encounter: 02/19/2020  Primary Cardiologist: No primary care provider on file. Previously seen by Dr.Branch and Nahser in 2017, lost to follow-up  Subjective   Feeling palpitations and lightheadedness this AM after taking nitroglycerin.  Feels poorly.  No chest pain or shortness of breath.  Inpatient Medications    Scheduled Meds: . amLODipine  10 mg Oral Daily  . aspirin EC  81 mg Oral Daily  . diltiazem  240 mg Oral Daily  . enoxaparin (LOVENOX) injection  30 mg Subcutaneous Q24H  . folic acid  1 mg Oral Daily  . gabapentin  100 mg Oral TID  . hydrALAZINE  75 mg Oral Q8H  . hydrochlorothiazide  12.5 mg Oral Daily  . insulin aspart  0-15 Units Subcutaneous Q4H  . isosorbide dinitrate  10 mg Oral TID  . multivitamin with minerals  1 tablet Oral Daily  . nicotine  21 mg Transdermal Daily  . pantoprazole  40 mg Oral Daily  . phenazopyridine  200 mg Oral TID WC  . pravastatin  20 mg Oral QHS  . sodium chloride flush  3 mL Intravenous Once  . sucralfate  1 g Oral TID WC & HS  . thiamine  100 mg Oral Daily   Continuous Infusions: . cefTRIAXone (ROCEPHIN)  IV 1 g (02/18/20 2059)   PRN Meds: acetaminophen **OR** acetaminophen, albuterol, hydrALAZINE, HYDROcodone-acetaminophen, ondansetron **OR** ondansetron (ZOFRAN) IV   Vital Signs    Vitals:   02/18/20 1724 02/18/20 1941 02/19/20 0356 02/19/20 0915  BP: (!) 187/63 131/62 131/61 (!) 141/45  Pulse: 80 76 66 69  Resp:  16 16 18   Temp: 99.4 F (37.4 C) 99.5 F (37.5 C) 98.4 F (36.9 C) 98.9 F (37.2 C)  TempSrc: Oral Oral Oral Oral  SpO2: 98% 97% 98% 99%    Intake/Output Summary (Last 24 hours) at 02/19/2020 1107 Last data filed at 02/19/2020 0400 Gross per 24 hour  Intake 480 ml  Output --  Net 480 ml   Last 3 Weights 11/21/2018 06/16/2018 01/02/2018  Weight (lbs) 160 lb 165 lb 160 lb  Weight (kg) 72.576 kg 74.844 kg 72.576 kg      Telemetry    Sinus  rhythm.  Approximately 10 seconds of SVT.  - Personally Reviewed  ECG    Sinus rhythm. Rate 86 bpm.  Cannot rule out prior anteroseptal infarct. QTc 500 ms. - Personally Reviewed  Physical Exam   VS:  BP (!) 141/45 (BP Location: Left Arm)   Pulse 69   Temp 98.9 F (37.2 C) (Oral)   Resp 18   SpO2 99%  , BMI There is no height or weight on file to calculate BMI. GENERAL:  Well appearing HEENT: Pupils equal round and reactive, fundi not visualized, oral mucosa unremarkable NECK:  No jugular venous distention, waveform within normal limits, carotid upstroke brisk and symmetric, no bruits LUNGS:  Clear to auscultation bilaterally HEART:  RRR.  PMI not displaced or sustained,S1 and S2 within normal limits, no S3, no S4, no clicks, no rubs, III/VI systolic murmur at the LUSB ABD:  Flat, positive bowel sounds normal in frequency in pitch, no bruits, no rebound, no guarding, no midline pulsatile mass, no hepatomegaly, no splenomegaly EXT:  2 plus pulses throughout, no edema, no cyanosis no clubbing SKIN:  No rashes no nodules NEURO:  Cranial nerves II through XII grossly intact, motor grossly intact throughout PSYCH:  Cognitively intact, oriented to person place  and time   Labs    High Sensitivity Troponin:   Recent Labs  Lab 02/16/20 1455 02/16/20 1855 02/16/20 1944 02/17/20 0051  TROPONINIHS 15 21* 20* 28*      Chemistry Recent Labs  Lab 02/16/20 1455 02/16/20 1455 02/17/20 0055 02/18/20 0331 02/19/20 0341  NA 141   < > 141 140 138  K 3.8   < > 3.0* 3.6 3.6  CL 110   < > 103 106 104  CO2 20*   < > 20* 21* 22  GLUCOSE 156*   < > 143* 159* 117*  BUN 21   < > 21 17 14   CREATININE 1.04   < > 1.08 1.05 1.19  CALCIUM 9.6   < > 9.5 8.9 9.1  PROT 8.0  --  7.2  --   --   ALBUMIN 4.0  --  3.7  --   --   AST 27  --  21  --   --   ALT 26  --  22  --   --   ALKPHOS 62  --  57  --   --   BILITOT 0.8  --  0.9  --   --   GFRNONAA >60   < > >60 >60 >60  GFRAA >60   < > >60 >60  >60  ANIONGAP 11   < > 18* 13 12   < > = values in this interval not displayed.     Hematology Recent Labs  Lab 02/17/20 0055 02/18/20 0331 02/19/20 0341  WBC 14.1* 11.7* 11.2*  RBC 5.99* 5.57 5.33  HGB 13.8 12.9* 12.4*  HCT 45.3 42.0 39.7  MCV 75.6* 75.4* 74.5*  MCH 23.0* 23.2* 23.3*  MCHC 30.5 30.7 31.2  RDW 15.3 14.6 14.6  PLT 316 282 270    BNPNo results for input(s): BNP, PROBNP in the last 168 hours.   DDimer No results for input(s): DDIMER in the last 168 hours.   Radiology    ECHOCARDIOGRAM COMPLETE  Result Date: 02/17/2020    ECHOCARDIOGRAM REPORT   Patient Name:   JAEDYN DEMELLO Date of Exam: 02/17/2020 Medical Rec #:  [id number]       Height:       69.0 in Accession #:    [id number]      Weight:       160.0 lb Date of Birth:  Sep 16, 1951       BSA:          1.88 m Patient Age:    69 years        BP:           190/61 mmHg Patient Gender: M               HR:           83 bpm. Exam Location:  Inpatient Procedure: 2D Echo Indications:    Murmur 785.2 / R01.1  History:        Patient has prior history of Echocardiogram examinations, most                 recent 01/23/2017. CHF, TIA; Risk Factors:Current Smoker,                 Diabetes and Hypertension. Alcohol abuse.  Sonographer:    Vikki Ports Turrentine Referring Phys: [place]  1. The LV is hyperdynamic with LVOT obstruction. Vmax 3.3 m/s and peak gradient 43 mmHG. 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 There is no characteristic posteriorly directed MR as expected with HOCM. There is mild LVH that is not asymmetric. Overall, this could represent obstruction at the level of the chordal apparatus due to hyperdynamic LV/small LV vs hypertrophic obstructive cardiomyopathy. A cardiac MRI would be helpful to distinguish the pathology. Left ventricular ejection fraction, by estimation, is >75%. The left ventricle has hyperdynamic function. The left ventricle has no regional wall  motion abnormalities. There is mild concentric left ventricular hypertrophy. Left ventricular diastolic parameters are consistent with Grade I diastolic dysfunction (impaired relaxation).  2. Right ventricular systolic function is normal. The right ventricular size is normal. Tricuspid regurgitation signal is inadequate for assessing PA pressure.  3. Left atrial size was mildly dilated.  4. The mitral valve is grossly normal. Trivial mitral valve regurgitation.  5. The aortic valve is tricuspid. Aortic valve regurgitation is mild. No aortic stenosis is present.  6. The inferior vena cava is normal in size with greater than 50% respiratory variability, suggesting right atrial pressure of 3 mmHg. Comparison(s): A prior study was performed on 01/23/2017. Prior images unable to be directly viewed, comparison made by report only. No significant change from prior study. 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 FINDINGS  Left Ventricle: The LV is hyperdynamic with LVOT obstruction. Vmax 3.3 m/s and peak gradient 43 mmHG. There appears to be chordal SAM with minimal anterior mitral valve SAM. There is no characteristic posteriorly directed MR as expected with HOCM. There  is mild LVH that is not asymmetric. Overall, this could represent obstruction at the level of the chordal apparatus due to hyperdynamic LV/small LV vs hypertrophic obstructive cardiomyopathy. A cardiac MRI would be helpful to distinguish the pathology. Left ventricular ejection fraction, by estimation, is >75%. The left ventricle has hyperdynamic function. The left ventricle has no regional wall motion abnormalities. The left ventricular internal cavity size was small. There is mild concentric left ventricular hypertrophy. Left ventricular diastolic parameters are consistent with Grade I diastolic dysfunction (impaired relaxation). Normal left ventricular filling pressure. Right Ventricle:  The right ventricular size is normal. No increase in right ventricular wall thickness. Right ventricular systolic function is normal. Tricuspid regurgitation signal is inadequate for assessing PA pressure. Left Atrium: Left atrial size was mildly dilated. Right Atrium: Right atrial size was normal in size. Pericardium: Trivial pericardial effusion is present. Presence of pericardial fat pad. Mitral Valve: The mitral valve is grossly normal. Trivial mitral valve regurgitation. Tricuspid Valve: The tricuspid valve is grossly normal. Tricuspid valve regurgitation is trivial. Aortic Valve: The aortic valve is tricuspid. Aortic valve regurgitation is mild. No aortic stenosis is present. There is mild calcification of the aortic valve. Pulmonic Valve: The pulmonic valve was grossly normal. Pulmonic valve regurgitation is not visualized. No evidence of pulmonic stenosis. Aorta: The aortic root is normal in size and structure. Venous: The inferior vena cava is normal in size with greater than 50% respiratory variability, suggesting right atrial pressure of 3 mmHg. IAS/Shunts: No atrial level shunt detected by color flow Doppler.  LEFT VENTRICLE PLAX 2D LVIDd:         4.74 cm  Diastology LVIDs:         3.11 cm  LV e' lateral:   5.55 cm/s LV PW:         1.18 cm  LV E/e' lateral: 11.5 LV IVS:        1.36 cm  LV e' medial:  4.65 cm/s LVOT diam:     1.80 cm  LV E/e' medial:  13.7 LV SV Index:   35.13 LVOT Area:     2.54 cm  RIGHT VENTRICLE RV S prime:     19.10 cm/s TAPSE (M-mode): 2.9 cm LEFT ATRIUM             Index       RIGHT ATRIUM           Index LA diam:        4.60 cm 2.45 cm/m  RA Area:     17.40 cm LA Vol (A2C):   82.8 ml 44.06 ml/m RA Volume:   43.70 ml  23.26 ml/m LA Vol (A4C):   65.0 ml 34.59 ml/m LA Biplane Vol: 73.9 ml 39.33 ml/m   AORTA Ao Root diam: 3.10 cm MITRAL VALVE MV Area (PHT): 2.17 cm     SHUNTS MV Decel Time: 349 msec     Systemic Diam: 1.80 cm MV E velocity: 63.90 cm/s MV A velocity: 107.00  cm/s MV E/A ratio:  0.60 Eleonore Chiquito MD Electronically signed by Eleonore Chiquito MD Signature Date/Time: 02/17/2020/1:36:45 PM    Final     Cardiac Studies   Echo 02/17/20:  IMPRESSIONS   1. The LV is hyperdynamic with LVOT obstruction. Vmax 3.3 m/s and peak  gradient 43 mmHG. There appears to be chordal SAM with minimal anterior  mitral valve SAM. There is no characteristic posteriorly directed MR as  expected with HOCM. There is mild LVH  that is not asymmetric. Overall, this could represent obstruction at the  level of the chordal apparatus due to hyperdynamic LV/small LV vs  hypertrophic obstructive cardiomyopathy. A cardiac MRI would be helpful to  distinguish the pathology. Left  ventricular ejection fraction, by estimation, is >75%. The left ventricle  has hyperdynamic function. The left ventricle has no regional wall motion  abnormalities. There is mild concentric left ventricular hypertrophy. Left  ventricular diastolic  parameters are consistent with Grade I diastolic dysfunction (impaired  relaxation).  2. Right ventricular systolic function is normal. The right ventricular  size is normal. Tricuspid regurgitation signal is inadequate for assessing  PA pressure.  3. Left atrial size was mildly dilated.  4. The mitral valve is grossly normal. Trivial mitral valve  regurgitation.  5. The aortic valve is tricuspid. Aortic valve regurgitation is mild. No  aortic stenosis is present.  6. The inferior vena cava is normal in size with greater than 50%  respiratory variability, suggesting right atrial pressure of 3 mmHg.   Patient Profile     69 y.o. male with hypertension, stroke, chronic diastolic heart failure, prior EtOH abuse, and cocaine abuse admitted with hypertensive urgency and abdominal pain/vomiting.  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  Assessment & Plan    # HCM:  Gradient noted on echo and on exam.  He only has mild-moderate LVH  (1.2-1.36 cm), but has SAM and outflow obstruction.  He was started on diltiazem given his +Utox for cocaine, so no beta blocker.  Plan for cardiac MRI.  If he is still here, we can likely get this tomorrow.  Otherwise, OK to do as an outpatient. He is feeling poorly after nitro and DBP is low.  Will give 500 mL NS bolus.  No VT on telemetry.  He had a run of SVT this AM.  Consider outpatient ETT to assess for hypotensive BP response or VT.    #  Hypertensive urgency:  BP now better controlled.  He felt poorly after taking nitro this am and reports that he was not taking this at home.  DBP was in the 40s.  Will stop nitro as decreased afterload will worsen hypertrophic cardiomyopathy obstruction and gradient.  Continue amlodipine, hydralazine, and HCTZ.  # Tobacco abuse: Cessation advised.   # Cocaine abuse:  Patient advised of the effects of cocaine on the heart.  No beta blocker as above.  # Hyperlipidemia; Continue pravastatin.      For questions or updates, please contact [place] Please consult [email or web address] for contact info under        Signed, Skeet Latch, MD  02/19/2020, 11:07 AM

## 2020-02-20 ENCOUNTER — Inpatient Hospital Stay (HOSPITAL_COMMUNITY): Payer: Medicare Other

## 2020-02-20 DIAGNOSIS — N390 Urinary tract infection, site not specified: Secondary | ICD-10-CM | POA: Diagnosis not present

## 2020-02-20 DIAGNOSIS — R1013 Epigastric pain: Secondary | ICD-10-CM | POA: Diagnosis not present

## 2020-02-20 DIAGNOSIS — N10 Acute tubulo-interstitial nephritis: Secondary | ICD-10-CM | POA: Diagnosis not present

## 2020-02-20 DIAGNOSIS — R011 Cardiac murmur, unspecified: Secondary | ICD-10-CM | POA: Diagnosis not present

## 2020-02-20 DIAGNOSIS — I5032 Chronic diastolic (congestive) heart failure: Secondary | ICD-10-CM | POA: Diagnosis not present

## 2020-02-20 DIAGNOSIS — I421 Obstructive hypertrophic cardiomyopathy: Secondary | ICD-10-CM | POA: Diagnosis not present

## 2020-02-20 LAB — BASIC METABOLIC PANEL
Anion gap: 12 (ref 5–15)
BUN: 13 mg/dL (ref 8–23)
CO2: 22 mmol/L (ref 22–32)
Calcium: 9.4 mg/dL (ref 8.9–10.3)
Chloride: 104 mmol/L (ref 98–111)
Creatinine, Ser: 1.07 mg/dL (ref 0.61–1.24)
GFR calc Af Amer: >60 mL/min (ref >60)
GFR calc non Af Amer: >60 mL/min (ref >60)
Glucose, Bld: 219 mg/dL — ABNORMAL HIGH (ref 70–99)
Potassium: 3.9 mmol/L (ref 3.5–5.1)
Sodium: 138 mmol/L (ref 135–145)

## 2020-02-20 LAB — CBC WITH DIFFERENTIAL/PLATELET
Abs Immature Granulocytes: 0.02 10*3/uL (ref 0.00–0.07)
Basophils Absolute: 0.1 10*3/uL (ref 0.0–0.1)
Basophils Relative: 1 %
Eosinophils Absolute: 0.3 10*3/uL (ref 0.0–0.5)
Eosinophils Relative: 3 %
HCT: 43.4 % (ref 39.0–52.0)
Hemoglobin: 13.3 g/dL (ref 13.0–17.0)
Immature Granulocytes: 0 %
Lymphocytes Relative: 30 %
Lymphs Abs: 2.6 10*3/uL (ref 0.7–4.0)
MCH: 23.2 pg — ABNORMAL LOW (ref 26.0–34.0)
MCHC: 30.6 g/dL (ref 30.0–36.0)
MCV: 75.6 fL — ABNORMAL LOW (ref 80.0–100.0)
Monocytes Absolute: 0.8 10*3/uL (ref 0.1–1.0)
Monocytes Relative: 9 %
Neutro Abs: 5 10*3/uL (ref 1.7–7.7)
Neutrophils Relative %: 57 %
Platelets: 286 10*3/uL (ref 150–400)
RBC: 5.74 MIL/uL (ref 4.22–5.81)
RDW: 14.8 % (ref 11.5–15.5)
WBC: 8.6 10*3/uL (ref 4.0–10.5)
nRBC: 0 % (ref 0.0–0.2)

## 2020-02-20 LAB — GLUCOSE, CAPILLARY
Glucose-Capillary: 112 mg/dL — ABNORMAL HIGH (ref 70–99)
Glucose-Capillary: 294 mg/dL — ABNORMAL HIGH (ref 70–99)
Glucose-Capillary: 109 mg/dL — ABNORMAL HIGH (ref 70–99)

## 2020-02-20 LAB — MAGNESIUM: Magnesium: 1.9 mg/dL (ref 1.7–2.4)

## 2020-02-20 MED ORDER — AMOXICILLIN 500 MG PO CAPS: 500.0000 mg | ORAL_CAPSULE | Freq: Three times a day (TID) | ORAL | 0 refills | Status: AC

## 2020-02-20 MED ORDER — ADULT MULTIVITAMIN W/MINERALS CH: 1.0000 | ORAL_TABLET | Freq: Every day | ORAL | Status: DC

## 2020-02-20 MED ORDER — SUCRALFATE 1 GM/10ML PO SUSP: 1.0000 g | Freq: Three times a day (TID) | ORAL | 0 refills | Status: DC

## 2020-02-20 MED ORDER — INSULIN ASPART 100 UNIT/ML ~~LOC~~ SOLN: 0.0000 [IU] | Freq: Three times a day (TID) | SUBCUTANEOUS | Status: DC

## 2020-02-20 MED ORDER — PANTOPRAZOLE SODIUM 40 MG PO TBEC: 40.0000 mg | DELAYED_RELEASE_TABLET | Freq: Every day | ORAL | 1 refills | Status: DC

## 2020-02-20 MED ORDER — HYDRALAZINE HCL 25 MG PO TABS: 75.0000 mg | ORAL_TABLET | Freq: Three times a day (TID) | ORAL | 1 refills | Status: DC

## 2020-02-20 MED ORDER — DILTIAZEM HCL ER COATED BEADS 240 MG PO CP24: 240.0000 mg | ORAL_CAPSULE | Freq: Every day | ORAL | 1 refills | Status: DC

## 2020-02-20 MED ORDER — FOLIC ACID 1 MG PO TABS: 1.0000 mg | ORAL_TABLET | Freq: Every day | ORAL | Status: DC

## 2020-02-20 MED ORDER — THIAMINE HCL 100 MG PO TABS: 100.0000 mg | ORAL_TABLET | Freq: Every day | ORAL | Status: DC

## 2020-02-20 MED ORDER — NICOTINE 21 MG/24HR TD PT24: 21.0000 mg | MEDICATED_PATCH | Freq: Every day | TRANSDERMAL | 0 refills | Status: AC

## 2020-02-20 MED ORDER — AMOXICILLIN 500 MG PO CAPS
500.0000 mg | ORAL_CAPSULE | Freq: Three times a day (TID) | ORAL | Status: DC
  Administered 2020-02-20: 18:00:00 500 mg via ORAL
  Filled 2020-02-20 (×3): qty 1

## 2020-02-20 MED ORDER — HYDROCHLOROTHIAZIDE 12.5 MG PO CAPS: 12.5000 mg | ORAL_CAPSULE | Freq: Every day | ORAL | 1 refills | Status: DC

## 2020-02-20 NOTE — Progress Notes (Signed)
Pt came down to the department for cardiac MRI.  Pt felt his exam had been delayed and the doctor did not explain to him what was going to happen (he was upset particularly about getting IV contrast).  Pt got on the table and told us he did not want to continue since he was never told he was going to have contrast "and everything" .  We asked him again if he would comply and he declined.  Pt's nurse was called and reading cardiologist informed and pt was sent back up to his room with transport.

## 2020-02-20 NOTE — Progress Notes (Signed)
Inpatient Diabetes Program Recommendations  AACE/ADA: New Consensus Statement on Inpatient Glycemic Control (2015)  Target Ranges:  Prepandial:   less than 140 mg/dL      Peak postprandial:   less than 180 mg/dL (1-2 hours)      Critically ill patients:  140 - 180 mg/dL   Lab Results  Component Value Date   GLUCAP 294 (H) 02/20/2020   HGBA1C 7.1 (H) 02/17/2020    Review of Glycemic Control Results for [name], [name] (MRN [id number]) as of 02/20/2020 10:10  Ref. Range 02/19/2020 18:06 02/19/2020 19:34 02/19/2020 23:55 02/20/2020 03:52 02/20/2020 07:57  Glucose-Capillary Latest Ref Range: 70 - 99 mg/dL 208 (H) 149 (H) 101 (H) 112 (H) 294 (H)   Diabetes history: DM 2 Outpatient Diabetes medications:  Metformin 500 mg bid Current orders for Inpatient glycemic control:  Novolog moderate q 4 hours  Inpatient Diabetes Program Recommendations:    Please change frequency of Novolog correction to tid with meals and HS.  Also consider adding Lantus 15 units daily while patient is in the hospital.   Thanks  Adah Perl, RN, BC-ADM Inpatient Diabetes Coordinator Pager [phone] (8a-5p)

## 2020-02-20 NOTE — Discharge Summary (Signed)
Physician Discharge Summary  [name] [id number] DOB: 09-19-1951 DOA: 02/16/2020  PCP: Nolene Ebbs, MD  Admit date: 02/16/2020 Discharge date: 02/20/2020  Time spent: 60 minutes  Recommendations for Outpatient Follow-up:  1. Follow-up with [facility] in 2 to 3 weeks.  Patient will need to follow-up on recurrent UTIs and triple phosphate crystals noted on urinalysis. 2. Follow-up with Dr. Marlou Porch, cardiology in 2 weeks.  On follow-up cardiac MRI will need to be revisited as well as further management of patient's HOCM. 3. Follow-up with Nolene Ebbs, MD in 2 weeks.  On follow-up patient will need a basic metabolic profile done to follow-up on electrolytes and renal function.  Patient also need a magnesium level checked.   Discharge Diagnoses:  Principal Problem:   Complicated UTI (urinary tract infection) Active Problems:   Diabetes mellitus type 2, controlled (HCC)   Tobacco use disorder   Leukocytosis   Chronic pain   Essential hypertension   Chronic diastolic heart failure (HCC)   Epigastric pain   Hypertensive urgency   Cardiac murmur   Epigastric abdominal pain   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[place])   Discharge Condition: Stable and improved  Diet recommendation: Heart healthy  Filed Weights   02/19/20 1423  Weight: 75.8 kg    History of present illness:  HPI per Dr. Rulon Eisenmenger is a 69 y.o. male with medical history significant of hypertension, diabetes Chronic diastolic CHF, CVA, recurrent UTI, EtOH Abuse  Presented with   2-day history of abdominal pain nausea and vomiting, describes pain is epigastric squeezing worse when he applies any pressure to it.  He is unable to tolerate any p.o. intake.  Has not been able to take his medications because it causes him vomiting.  No associated diarrhea.  Last normal BM was today. No associated shortness of breath no fevers or chills  Reports abdominal pain in the lower  abdomen as well.  Reports after he took Aleve his abdomen started to hurt worse Reports severe dysuria  He usually takes medications with food have not had BP meds for the past 3 days  Pt has frequent UTi and have seen urologist in the past No CP, SOB,   Continues to smoke interested in quitting and requesting nicotine patch  Infectious risk factors:  Reports chills,  URI symptoms,   N/V abdominal pain,  Body aches, severe fatigue    In  ER   COVID TEST  NEGATIVE   Hospital Course:  1 acute complicated UTI Patient had presented with nausea vomiting noted to have a leukocytosis with abdominal pain and inability to keep oral intake down.  CT which was done was negative for perinephric abscess however did show diffuse bladder wall thickening and several bladder diverticula, loss of fat plane between sigmoid colon and bladder wall suggestive of adhesion can predispose to colovesicular fistula.  Urinalysis done concerning for UTI with triple phosphate crystals noted.    Patient placed empirically on IV Rocephin.  Patient improved clinically during the hospitalization. Urine cultures with 70,000 colonies of Corynebacterium diphtheroids.  Blood cultures with no growth to date.  Transitioned from IV Rocephin to oral amoxicillin to complete a 5 to 7-day course of antibiotic treatment.  Patient be discharged home on 4 more days of oral amoxicillin to complete a 7-day course of antibiotic treatment.  Outpatient follow-up with PCP.  Patient also need to follow-up with urology in the outpatient setting.   2.  Hypertensive urgency In the setting of medication  noncompliance secondary to nausea and vomiting and inability to keep oral intake down.  Patient improved clinically and was subsequently tolerating oral diet.  Patient was subsequently placed on Norvasc, HCTZ, hydralazine and Cardizem added to regimen due to HOCM.  Patient's Isordil discontinued per cardiology.  Blood pressure improved.   Outpatient follow-up with PCP.   3.  Chronic diastolic heart failure Compensated.  Patient maintained on cardiac regimen of Norvasc, hydralazine, HCTZ, Cardizem.  Isordil discontinued as patient noted to have some lightheadedness palpitations and diastolic blood pressures in the 40s on 02/19/2020.  Outpatient follow-up with cardiology  4.  Leukocytosis Likely secondary to problem #1.  Urine cultures with 70,000 colonies of Corynebacterium diphtheroids.    Patient initially placed on IV Rocephin and subsequently transition to oral amoxicillin.   5.  Epigastric pain Noted to be worse after taking Aleve.  Patient improved clinically on Carafate as well as PPI.  Outpatient follow-up.   6.  Cardiac murmur/HOCM--per 2D echo 02/17/2020, 01/23/2017 2D echo with hyperdynamic LV with LVOT obstruction.  Mild LVH that was not asymmetric.  EF of greater than 75%.  No wall motion abnormalities noted.  Mild concentric left ventricular hypertrophy.  Grade 1 diastolic dysfunction.  Mildly dilated left atrial size.  Normal right ventricular systolic function.  HOCM noted on 2D echo from 01/23/2017 however patient left AMA prior to being assessed by cardiology.  Patient seen in consultation by cardiology and Cardizem added to patient's regimen.  Patient unable to be placed on beta-blocker due to history of cocaine use.  Patient noted to have some palpitations and lightheadedness after being given some nitroglycerin the morning of 02/19/2020, and noted to have a burst of SVT.  Cardiology discontinued nitroglycerin and patient given a bolus of normal saline as patient also noted to have diastolic blood pressures in the 40s.  Cardiology recommended cardiac MRI to be done on 02/20/2020 however when patient was taken down patient was unable to tolerate cardiac MRI and refused study as he was upset he needed IV contrast.  Patient will follow up with cardiology in the outpatient setting.  Patient was discharged on hydralazine,  Norvasc, Cardizem and HCTZ.  Patient's Isordil has been discontinued.    7.  Tobacco abuse Tobacco cessation.    Patient was placed on a nicotine patch.  8.  Polysubstance abuse UDS noted to be positive for cocaine.  Polysubstance cessation stressed to patient.  Patient seemed to be motivated for polysubstance cessation in light of his abnormal 2D echo.  Social work consulted.  9.  History of alcohol abuse Patient did tell admitting physician he had not been drinking for the past 6 years.  Alcohol level negative.  Patient with no signs of withdrawal during the hospitalization.  Patient was maintained on thiamine, folic acid, multivitamin.  10.  Diabetes mellitus type 2 Hemoglobin A1c 7.1.    Patient's oral hypoglycemic agents were held during the hospitalization and patient maintained on sliding scale insulin.  Outpatient follow-up.    11.  Chronic pain Patient maintained on home pain regimen.  Outpatient follow-up.   12.  Hypokalemia/hypomagnesemia Likely secondary to GI losses.    Potassium and magnesium were repleted during the hospitalization.  Outpatient follow-up.     Procedures:  CT abdomen and pelvis 02/16/2020  2D echo 02/17/2020  Cardiac MRI attempted however patient unable to tolerate.  02/20/2020  Consultations:  Cardiology: Dr. Marlou Porch 02/18/2020   Discharge Exam: Vitals:   02/20/20 0945 02/20/20 1353  BP: (!) 149/34 Marland Kitchen)  149/59  Pulse:  64  Resp:  20  Temp:  98.7 F (37.1 C)  SpO2:  99%    General: NAD Cardiovascular: RRR Respiratory: CTAB  Discharge Instructions   Discharge Instructions    Diet - low sodium heart healthy   Complete by: As directed    Increase activity slowly   Complete by: As directed      Allergies as of 02/20/2020   No Known Allergies     Medication List    STOP taking these medications   acetaminophen 500 MG tablet Commonly known as: TYLENOL   HYDROcodone-acetaminophen 5-325 MG tablet Commonly known as: Norco    isosorbide dinitrate 10 MG tablet Commonly known as: ISORDIL   losartan-hydrochlorothiazide 100-12.5 MG tablet Commonly known as: HYZAAR   ondansetron 4 MG disintegrating tablet Commonly known as: Zofran ODT   ondansetron 4 MG tablet Commonly known as: ZOFRAN   tamsulosin 0.4 MG Caps capsule Commonly known as: Flomax     TAKE these medications   albuterol 108 (90 Base) MCG/ACT inhaler Commonly known as: VENTOLIN HFA Inhale 1-2 puffs into the lungs every 6 (six) hours as needed for wheezing.   amLODipine 10 MG tablet Commonly known as: NORVASC Take 10 mg by mouth daily.   amoxicillin 500 MG capsule Commonly known as: AMOXIL Take 1 capsule (500 mg total) by mouth every 8 (eight) hours for 4 days.   aspirin EC 81 MG tablet Take 81 mg by mouth daily.   diltiazem 240 MG 24 hr capsule Commonly known as: CARDIZEM CD Take 1 capsule (240 mg total) by mouth daily. Start taking on: February 21, 2020   docusate sodium 100 MG capsule Commonly known as: COLACE Take 1 capsule (100 mg total) by mouth every 12 (twelve) hours. What changed: when to take this   folic acid 1 MG tablet Commonly known as: FOLVITE Take 1 tablet (1 mg total) by mouth daily. Start taking on: February 21, 2020   gabapentin 100 MG capsule Commonly known as: NEURONTIN Take 100 mg by mouth 3 (three) times daily.   hydrALAZINE 25 MG tablet Commonly known as: APRESOLINE Take 3 tablets (75 mg total) by mouth every 8 (eight) hours. What changed:   medication strength  how much to take   hydrochlorothiazide 12.5 MG capsule Commonly known as: MICROZIDE Take 1 capsule (12.5 mg total) by mouth daily.   ibuprofen 800 MG tablet Commonly known as: ADVIL Take 0.5 tablets (400 mg total) by mouth every 6 (six) hours as needed (pain).   metFORMIN 500 MG tablet Commonly known as: GLUCOPHAGE Take 500 mg by mouth 2 (two) times daily with a meal.   multivitamin with minerals Tabs tablet Take 1 tablet by  mouth daily. Start taking on: February 21, 2020   nicotine 21 mg/24hr patch Commonly known as: NICODERM CQ - dosed in mg/24 hours Place 1 patch (21 mg total) onto the skin daily. Start taking on: February 21, 2020   oxyCODONE-acetaminophen 5-325 MG tablet Commonly known as: PERCOCET/ROXICET Take 1 tablet by mouth every 8 (eight) hours as needed for moderate pain.   pantoprazole 40 MG tablet Commonly known as: PROTONIX Take 1 tablet (40 mg total) by mouth daily. What changed:   medication strength  how much to take   pravastatin 20 MG tablet Commonly known as: PRAVACHOL Take 20 mg by mouth at bedtime.   sucralfate 1 GM/10ML suspension Commonly known as: CARAFATE Take 10 mLs (1 g total) by mouth 4 (four) times daily -  with meals and at bedtime.   thiamine 100 MG tablet Take 1 tablet (100 mg total) by mouth daily. Start taking on: February 21, 2020   Vitamin D (Ergocalciferol) 1.25 MG (50000 UNIT) Caps capsule Commonly known as: DRISDOL Take 50,000 Units by mouth once a week. Mondays      No Known Allergies Follow-up Information    Nolene Ebbs, MD. Schedule an appointment as soon as possible for a visit in 2 week(s).   Specialty: Internal Medicine Contact information: [place] [address] [phone]        Jerline Pain, MD. Schedule an appointment as soon as possible for a visit in 2 week(s).   Specialty: Cardiology Contact information: [id number] [place] [address] [phone]        [place]. Schedule an appointment as soon as possible for a visit in 2 week(s).   Why: f/u in 2-3 weeks. Contact information: [place] [phone]           The results of significant diagnostics from this hospitalization (including imaging, microbiology, ancillary and laboratory) are listed below for reference.    Significant Diagnostic Studies: CT  ABDOMEN PELVIS W CONTRAST  Result Date: 02/16/2020 CLINICAL DATA:  69 year old male with abdominal pain, nausea vomiting. EXAM: CT ABDOMEN AND PELVIS WITH CONTRAST TECHNIQUE: Multidetector CT imaging of the abdomen and pelvis was performed using the standard protocol following bolus administration of intravenous contrast. CONTRAST:  143mL OMNIPAQUE IOHEXOL 300 MG/ML  SOLN COMPARISON:  CT abdomen pelvis dated 11/15/2019. FINDINGS: Lower chest: The visualized lung bases are clear. There is coronary vascular calcification primarily involving the left circumflex artery. No intra-abdominal free air or free fluid. Hepatobiliary: Small left hepatic hypodense lesions are suboptimally characterized but appears similar to prior CT, likely cysts or hemangioma. There is apparent background of fatty infiltration of the liver. No intrahepatic biliary ductal dilatation. No calcified gallstone or pericholecystic fluid. Pancreas: Unremarkable. No pancreatic ductal dilatation or surrounding inflammatory changes. Spleen: Normal in size without focal abnormality. Adrenals/Urinary Tract: The adrenal glands are unremarkable. There is no hydronephrosis on either side. There is symmetric enhancement and excretion of contrast by both kidneys. The visualized ureters appear unremarkable. There is diffuse thickened bladder wall with large bladder diverticula. Findings likely combination of chronic bladder outlet obstruction and infection. Correlation with urinalysis recommended to evaluate for acute cystitis. Stomach/Bowel: There is extensive sigmoid diverticulosis and scattered colonic diverticula without active inflammatory changes. There is loss of fat plane between the sigmoid colon and bladder wall suggestive of adhesion. This can predispose to a colovesical fistula. There is no bowel obstruction or active inflammation. The appendix is normal. Vascular/Lymphatic: Moderate aortoiliac atherosclerotic disease. The IVC is unremarkable. No  portal venous gas. There is no adenopathy. Reproductive: The prostate and seminal vesicles are grossly unremarkable. Other: None Musculoskeletal: No acute or significant osseous findings. IMPRESSION: 1. No acute intra-abdominal or pelvic pathology. Extensive colonic diverticulosis. No bowel obstruction or active inflammation. Normal appendix. 2. Diffuse bladder wall thickening and several bladder diverticula. Findings likely represent combination of chronic infection and bladder outlet obstruction. Correlation with urinalysis recommended to exclude active cystitis. 3. Loss of fat plane between sigmoid colon and bladder wall suggestive of adhesion. This can predispose to a colovesical fistula. 4. Fatty liver. 5. Moderate Aortic Atherosclerosis (ICD10-I70.0). Electronically Signed   By: Anner Crete M.D.   On: 02/16/2020 18:22   ECHOCARDIOGRAM COMPLETE  Result Date:  02/17/2020    ECHOCARDIOGRAM REPORT   Patient Name:   MIHAILO ALBRECHT Date of Exam: 02/17/2020 Medical Rec #:  [id number]       Height:       69.0 in Accession #:    [id number]      Weight:       160.0 lb Date of Birth:  [birth year]       BSA:          1.88 m Patient Age:    89 years        BP:           190/61 mmHg Patient Gender: M               HR:           83 bpm. Exam Location:  Inpatient Procedure: 2D Echo Indications:    Murmur 785.2 / R01.1  History:        Patient has prior history of Echocardiogram examinations, most                 recent 01/23/2017. CHF, TIA; Risk Factors:Current Smoker,                 Diabetes and Hypertension. Alcohol abuse.  Sonographer:    Vikki Ports Turrentine Referring Phys: [place]  1. The LV is hyperdynamic with LVOT obstruction. Vmax 3.3 m/s and peak gradient 43 mmHG. There appears to be chordal SAM with minimal anterior mitral valve SAM. There is no characteristic posteriorly directed MR as expected with HOCM. There is mild LVH that is not asymmetric. Overall, this could represent  obstruction at the level of the chordal apparatus due to hyperdynamic LV/small LV vs hypertrophic obstructive cardiomyopathy. A cardiac MRI would be helpful to distinguish the pathology. Left ventricular ejection fraction, by estimation, is >75%. The left ventricle has hyperdynamic function. The left ventricle has no regional wall motion abnormalities. There is mild concentric left ventricular hypertrophy. Left ventricular diastolic parameters are consistent with Grade I diastolic dysfunction (impaired relaxation).  2. Right ventricular systolic function is normal. The right ventricular size is normal. Tricuspid regurgitation signal is inadequate for assessing PA pressure.  3. Left atrial size was mildly dilated.  4. The mitral valve is grossly normal. Trivial mitral valve regurgitation.  5. The aortic valve is tricuspid. Aortic valve regurgitation is mild. No aortic stenosis is present.  6. The inferior vena cava is normal in size with greater than 50% respiratory variability, suggesting right atrial pressure of 3 mmHg. Comparison(s): A prior study was performed on 01/23/2017. Prior images unable to be directly viewed, comparison made by report only. No significant change from prior study. 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 FINDINGS  Left Ventricle: The LV is hyperdynamic with LVOT obstruction. Vmax 3.3 m/s and peak gradient 43 mmHG. There appears to be chordal SAM with minimal anterior mitral valve SAM. There is no characteristic posteriorly directed MR as expected with HOCM. There  is mild LVH that is not asymmetric. Overall, this could represent obstruction at the level of the chordal apparatus due to hyperdynamic LV/small LV vs hypertrophic obstructive cardiomyopathy. A cardiac MRI would be helpful to distinguish the pathology. Left ventricular ejection fraction, by estimation, is >75%. The left ventricle has hyperdynamic function. The  left ventricle has no regional wall motion abnormalities. The left ventricular internal cavity size was small. There is mild concentric left ventricular hypertrophy. Left ventricular diastolic parameters are consistent with Grade  I diastolic dysfunction (impaired relaxation). Normal left ventricular filling pressure. Right Ventricle: The right ventricular size is normal. No increase in right ventricular wall thickness. Right ventricular systolic function is normal. Tricuspid regurgitation signal is inadequate for assessing PA pressure. Left Atrium: Left atrial size was mildly dilated. Right Atrium: Right atrial size was normal in size. Pericardium: Trivial pericardial effusion is present. Presence of pericardial fat pad. Mitral Valve: The mitral valve is grossly normal. Trivial mitral valve regurgitation. Tricuspid Valve: The tricuspid valve is grossly normal. Tricuspid valve regurgitation is trivial. Aortic Valve: The aortic valve is tricuspid. Aortic valve regurgitation is mild. No aortic stenosis is present. There is mild calcification of the aortic valve. Pulmonic Valve: The pulmonic valve was grossly normal. Pulmonic valve regurgitation is not visualized. No evidence of pulmonic stenosis. Aorta: The aortic root is normal in size and structure. Venous: The inferior vena cava is normal in size with greater than 50% respiratory variability, suggesting right atrial pressure of 3 mmHg. IAS/Shunts: No atrial level shunt detected by color flow Doppler.  LEFT VENTRICLE PLAX 2D LVIDd:         4.74 cm  Diastology LVIDs:         3.11 cm  LV e' lateral:   5.55 cm/s LV PW:         1.18 cm  LV E/e' lateral: 11.5 LV IVS:        1.36 cm  LV e' medial:    4.65 cm/s LVOT diam:     1.80 cm  LV E/e' medial:  13.7 LV SV Index:   35.13 LVOT Area:     2.54 cm  RIGHT VENTRICLE RV S prime:     19.10 cm/s TAPSE (M-mode): 2.9 cm LEFT ATRIUM             Index       RIGHT ATRIUM           Index LA diam:        4.60 cm 2.45 cm/m  RA Area:      17.40 cm LA Vol (A2C):   82.8 ml 44.06 ml/m RA Volume:   43.70 ml  23.26 ml/m LA Vol (A4C):   65.0 ml 34.59 ml/m LA Biplane Vol: 73.9 ml 39.33 ml/m   AORTA Ao Root diam: 3.10 cm MITRAL VALVE MV Area (PHT): 2.17 cm     SHUNTS MV Decel Time: 349 msec     Systemic Diam: 1.80 cm MV E velocity: 63.90 cm/s MV A velocity: 107.00 cm/s MV E/A ratio:  0.60 Eleonore Chiquito MD Electronically signed by Eleonore Chiquito MD Signature Date/Time: 02/17/2020/1:36:45 PM    Final     Microbiology: Recent Results (from the past 240 hour(s))  Respiratory Panel by RT PCR (Flu A&B, Covid) - Nasopharyngeal Swab     Status: None   Collection Time: 02/16/20  7:47 PM   Specimen: Nasopharyngeal Swab  Result Value Ref Range Status   SARS Coronavirus 2 by RT PCR NEGATIVE NEGATIVE Final    Comment: (NOTE) SARS-CoV-2 target nucleic acids are NOT DETECTED. The SARS-CoV-2 RNA is generally detectable in upper respiratoy specimens during the acute phase of infection. The lowest concentration of SARS-CoV-2 viral copies this assay can detect is 131 copies/mL. A negative result does not preclude SARS-Cov-2 infection and should not be used as the sole basis for treatment or other patient management decisions. A negative result may occur with  improper specimen collection/handling, submission of specimen other than nasopharyngeal swab, presence of viral mutation(s)  within the areas targeted by this assay, and inadequate number of viral copies (<131 copies/mL). A negative result must be combined with clinical observations, patient history, and epidemiological information. The expected result is Negative. Fact Sheet for Patients:  PinkCheek.be Fact Sheet for Healthcare Providers:  GravelBags.it This test is not yet ap proved or cleared by the [place] FDA and  has been authorized for detection and/or diagnosis of SARS-CoV-2 by FDA under an Emergency Use  Authorization (EUA). This EUA will remain  in effect (meaning this test can be used) for the duration of the COVID-19 declaration under Section 564(b)(1) of the Act, 21 U.S.C. section 360bbb-3(b)(1), unless the authorization is terminated or revoked sooner.    Influenza A by PCR NEGATIVE NEGATIVE Final   Influenza B by PCR NEGATIVE NEGATIVE Final    Comment: (NOTE) The Xpert Xpress SARS-CoV-2/FLU/RSV assay is intended as an aid in  the diagnosis of influenza from Nasopharyngeal swab specimens and  should not be used as a sole basis for treatment. Nasal washings and  aspirates are unacceptable for Xpert Xpress SARS-CoV-2/FLU/RSV  testing. Fact Sheet for Patients: PinkCheek.be Fact Sheet for Healthcare Providers: GravelBags.it This test is not yet approved or cleared by the [place] FDA and  has been authorized for detection and/or diagnosis of SARS-CoV-2 by  FDA under an Emergency Use Authorization (EUA). This EUA will remain  in effect (meaning this test can be used) for the duration of the  Covid-19 declaration under Section 564(b)(1) of the Act, 21  U.S.C. section 360bbb-3(b)(1), unless the authorization is  terminated or revoked. Performed at [facility], [place] [address] [place], [place] [address]   Culture, Urine     Status: Abnormal   Collection Time: 02/16/20  9:10 PM   Specimen: Urine, Clean Catch  Result Value Ref Range Status   Specimen Description URINE, CLEAN CATCH  Final   Special Requests NONE  Final   Culture (A)  Final    70,000 COLONIES/mL DIPHTHEROIDS(CORYNEBACTERIUM SPECIES) Standardized susceptibility testing for this organism is not available. Performed at [facility], [place] [address], [place], [place] [address]    Report Status 02/17/2020 FINAL  Final  Culture, blood (Routine X 2) w Reflex to ID Panel     Status: None (Preliminary result)   Collection Time: 02/17/20 10:30 AM    Specimen: BLOOD RIGHT ARM  Result Value Ref Range Status   Specimen Description BLOOD RIGHT ARM  Final   Special Requests   Final    BOTTLES DRAWN AEROBIC AND ANAEROBIC Blood Culture adequate volume   Culture   Final    NO GROWTH 3 DAYS Performed at [facility], [place] [address], [place], [place] [address]    Report Status PENDING  Incomplete  Culture, blood (Routine X 2) w Reflex to ID Panel     Status: None (Preliminary result)   Collection Time: 02/17/20 10:35 AM   Specimen: BLOOD RIGHT HAND  Result Value Ref Range Status   Specimen Description BLOOD RIGHT HAND  Final   Special Requests   Final    BOTTLES DRAWN AEROBIC AND ANAEROBIC Blood Culture adequate volume   Culture   Final    NO GROWTH 3 DAYS Performed at [facility], [place] [address] [place], [place] [address]    Report Status PENDING  Incomplete  Culture, Urine     Status: None   Collection Time: 02/18/20  2:24 PM   Specimen: Urine, Random  Result  Value Ref Range Status   Specimen Description URINE, RANDOM  Final   Special Requests NONE  Final   Culture   Final    NO GROWTH Performed at [facility], [address], [place], [place] [address]    Report Status 02/19/2020 FINAL  Final     Labs: Basic Metabolic Panel: Recent Labs  Lab 02/16/20 1455 02/17/20 0055 02/18/20 0331 02/19/20 0341 02/20/20 0906  NA 141 141 140 138 138  K 3.8 3.0* 3.6 3.6 3.9  CL 110 103 106 104 104  CO2 20* 20* 21* 22 22  GLUCOSE 156* 143* 159* 117* 219*  BUN 21 21 17 14 13   CREATININE 1.04 1.08 1.05 1.19 1.07  CALCIUM 9.6 9.5 8.9 9.1 9.4  MG  --  1.7 2.4 2.0 1.9  PHOS  --  2.8  --   --   --    Liver Function Tests: Recent Labs  Lab 02/16/20 1455 02/17/20 0055  AST 27 21  ALT 26 22  ALKPHOS 62 57  BILITOT 0.8 0.9  PROT 8.0 7.2  ALBUMIN 4.0 3.7   Recent Labs  Lab 02/16/20 1455  LIPASE 23   No results for input(s): AMMONIA in the last 168 hours. CBC: Recent Labs  Lab 02/16/20 1455  02/17/20 0055 02/18/20 0331 02/19/20 0341 02/20/20 0906  WBC 12.2* 14.1* 11.7* 11.2* 8.6  NEUTROABS  --   --  6.4  --  5.0  HGB 15.8 13.8 12.9* 12.4* 13.3  HCT 51.0 45.3 42.0 39.7 43.4  MCV 74.3* 75.6* 75.4* 74.5* 75.6*  PLT 324 316 282 270 286   Cardiac Enzymes: Recent Labs  Lab 02/16/20 1922  CKTOTAL 61   BNP: BNP (last 3 results) No results for input(s): BNP in the last 8760 hours.  ProBNP (last 3 results) No results for input(s): PROBNP in the last 8760 hours.  CBG: Recent Labs  Lab 02/19/20 1934 02/19/20 2355 02/20/20 0352 02/20/20 0757 02/20/20 1150  GLUCAP 149* 101* 112* 294* 109*       Signed:  Irine Seal MD.  [facility] 02/20/2020, 4:12 PM

## 2020-02-20 NOTE — Progress Notes (Signed)
Patient to MRI via w/c

## 2020-02-20 NOTE — Progress Notes (Signed)
Progress Note  Patient Name: [name] Date of Encounter: 02/20/2020  Primary Cardiologist: No primary care provider on file. [name] new  Subjective   Laying comfortably in bed.  No chest pain no shortness of breath  Inpatient Medications    Scheduled Meds: . amLODipine  10 mg Oral Daily  . aspirin EC  81 mg Oral Daily  . diltiazem  240 mg Oral Daily  . enoxaparin (LOVENOX) injection  30 mg Subcutaneous Q24H  . folic acid  1 mg Oral Daily  . gabapentin  100 mg Oral TID  . hydrALAZINE  75 mg Oral Q8H  . hydrochlorothiazide  12.5 mg Oral Daily  . insulin aspart  0-15 Units Subcutaneous Q4H  . multivitamin with minerals  1 tablet Oral Daily  . nicotine  21 mg Transdermal Daily  . pantoprazole  40 mg Oral Daily  . phenazopyridine  200 mg Oral TID WC  . pravastatin  20 mg Oral QHS  . sodium chloride flush  3 mL Intravenous Once  . sucralfate  1 g Oral TID WC & HS  . thiamine  100 mg Oral Daily   Continuous Infusions: . cefTRIAXone (ROCEPHIN)  IV Stopped (02/19/20 2121)   PRN Meds: acetaminophen **OR** acetaminophen, albuterol, hydrALAZINE, HYDROcodone-acetaminophen, ondansetron **OR** ondansetron (ZOFRAN) IV   Vital Signs    Vitals:   02/19/20 1423 02/19/20 1928 02/20/20 0355 02/20/20 0800  BP:  (!) 166/50 (!) 155/56 (!) 149/34  Pulse:  66 (!) 57 70  Resp:  16 16 18   Temp:  99 F (37.2 C) 98.5 F (36.9 C) 98.7 F (37.1 C)  TempSrc: Oral Oral Oral Oral  SpO2:  100% 95% 95%  Weight: 75.8 kg     Height: 5\' 9"  (1.753 m)       Intake/Output Summary (Last 24 hours) at 02/20/2020 0907 Last data filed at 02/20/2020 0016 Gross per 24 hour  Intake 742.03 ml  Output 500 ml  Net 242.03 ml   Last 3 Weights 02/19/2020 11/21/2018 06/16/2018  Weight (lbs) 167 lb 160 lb 165 lb  Weight (kg) 75.751 kg 72.576 kg 74.844 kg      Physical Exam   GEN: No acute distress.   Neck: No JVD Cardiac: RRR, 2/6 systolic murmur, no rubs, or gallops.  Respiratory: Clear to  auscultation bilaterally. GI: Soft, nontender, non-distended  MS: No edema; No deformity. Neuro:  Nonfocal  Psych: Normal affect   Labs    High Sensitivity Troponin:   Recent Labs  Lab 02/16/20 1455 02/16/20 1855 02/16/20 1944 02/17/20 0051  TROPONINIHS 15 21* 20* 28*      Chemistry Recent Labs  Lab 02/16/20 1455 02/16/20 1455 02/17/20 0055 02/18/20 0331 02/19/20 0341  NA 141   < > 141 140 138  K 3.8   < > 3.0* 3.6 3.6  CL 110   < > 103 106 104  CO2 20*   < > 20* 21* 22  GLUCOSE 156*   < > 143* 159* 117*  BUN 21   < > 21 17 14   CREATININE 1.04   < > 1.08 1.05 1.19  CALCIUM 9.6   < > 9.5 8.9 9.1  PROT 8.0  --  7.2  --   --   ALBUMIN 4.0  --  3.7  --   --   AST 27  --  21  --   --   ALT 26  --  22  --   --   ALKPHOS 62  --  57  --   --   BILITOT 0.8  --  0.9  --   --   GFRNONAA >60   < > >60 >60 >60  GFRAA >60   < > >60 >60 >60  ANIONGAP 11   < > 18* 13 12   < > = values in this interval not displayed.     Hematology Recent Labs  Lab 02/17/20 0055 02/18/20 0331 02/19/20 0341  WBC 14.1* 11.7* 11.2*  RBC 5.99* 5.57 5.33  HGB 13.8 12.9* 12.4*  HCT 45.3 42.0 39.7  MCV 75.6* 75.4* 74.5*  MCH 23.0* 23.2* 23.3*  MCHC 30.5 30.7 31.2  RDW 15.3 14.6 14.6  PLT 316 282 270    BNPNo results for input(s): BNP, PROBNP in the last 168 hours.   DDimer No results for input(s): DDIMER in the last 168 hours.   Radiology    No results found.  Cardiac Studies   Cardiac MRI pending  Patient Profile     69 y.o. male with hypertrophic obstructive cardiomyopathy  Assessment & Plan    Hypertrophic obstructive cardiomyopathy -Checking cardiac MRI for further clarification. -Yesterday we stopped his isosorbide to reduce his afterload reduction.  His diastolic pressure was quite especially after addition of diltiazem 240. -Telemetry reveals sinus rhythm 68.  History of stroke polysubstance abuse alcohol use hypertension diabetes -Continue to optimize.  Encourage  cessation.      For questions or updates, please contact [place] Please consult [email or web address] for contact info under        Signed, Candee Furbish, MD  02/20/2020, 9:07 AM

## 2020-02-20 NOTE — Progress Notes (Signed)
Discharge instruction packet printed and provided.  Discharge instructions reviewed and the patient verbalizes understanding.  The patient will be discharged to home with follow up

## 2020-02-20 NOTE — Progress Notes (Signed)
PROGRESS NOTE    EL PILE  UVO:[id number] DOB: Dec 06, 1951 DOA: 02/16/2020 PCP: Nolene Ebbs, MD    Brief Narrative:  Patient 69 year old gentleman history of hypertension, diabetes, chronic diastolic CHF, history of CVA, recurrent UTIs, alcohol abuse, polysubstance abuse presented to the ED with a 2-day history of abdominal pain nausea and vomiting with epigastric pain described as squeezing and worse when he applies pressure to it.  Patient also with complaints of dysuria.  Unable to tolerate oral intake.  Unable to take medications.  Noted to be in hypertensive urgency.  Patient seen in the ED CT abdomen and pelvis done with diffuse bladder wall thickening and several bladder diverticula findings likely representing combination of chronic infection and bladder outlet obstruction.  Loss of fat plane between sigmoid colon and bladder wall suggestive of adhesion can predispose to colovesicular fistula.  Urinalysis done with trace leukocytes, nitrite negative, greater than 50 WBCs, triple phosphate crystals noted.  Patient placed empirically on IV Rocephin and pancultured.   Assessment & Plan:   Principal Problem:   Complicated UTI (urinary tract infection) Active Problems:   Diabetes mellitus type 2, controlled (HCC)   Tobacco use disorder   Leukocytosis   Chronic pain   Essential hypertension   Chronic diastolic heart failure (HCC)   Epigastric pain   Hypertensive urgency   Cardiac murmur   Epigastric abdominal pain   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[place])  1 acute complicated UTI Patient had presented with nausea vomiting noted to have a leukocytosis with abdominal pain and inability to keep oral intake down.  CT which was done was negative for perinephric abscess however did show diffuse bladder wall thickening and several bladder diverticula, loss of fat plane between sigmoid colon and bladder wall suggestive of adhesion can predispose to colovesicular fistula.   Urinalysis done concerning for UTI with triple phosphate crystals noted.  Patient improving clinically.  Urine cultures with 70,000 colonies of Corynebacterium diphtheroids.  Blood cultures with no growth to date.  Transitioned from IV Rocephin to oral amoxicillin to complete a 5 to 7-day course of antibiotic treatment.  Continue Pyridium.  Will likely need outpatient follow-up with urology post discharge.  Supportive care.   2.  Hypertensive urgency In the setting of medication noncompliance secondary to nausea and vomiting and inability to keep oral intake down.  Patient tolerating solid diet.  Blood pressure improved on current regimen.  Continue Norvasc, HCTZ, hydralazine, Cardizem.  Isordil discontinued.  Outpatient follow-up.  3.  Chronic diastolic heart failure Compensated. Continue cardiac regimen of Norvasc, hydralazine, HCTZ, Cardizem.  Isordil discontinued as patient noted to have some lightheadedness palpitations and diastolic blood pressures in the 40s on 02/19/2020.  Cardiology following.  Follow.  4.  Leukocytosis Likely secondary to problem #1.  Urine cultures with 70,000 colonies of Corynebacterium diphtheroids.  Transitioned from IV Rocephin to oral amoxicillin.  Follow.   5.  Epigastric pain Noted to be worse after taking Aleve.  Patient with some clinical improvement.  Continue Carafate.  Continue PPI.  6.  Cardiac murmur/HOCM--per 2D echo 02/17/2020, 01/23/2017 2D echo with hyperdynamic LV with LVOT obstruction.  Mild LVH that was not asymmetric.  EF of greater than 75%.  No wall motion abnormalities noted.  Mild concentric left ventricular hypertrophy.  Grade 1 diastolic dysfunction.  Mildly dilated left atrial size.  Normal right ventricular systolic function.  HOCM noted on 2D echo from 01/23/2017 however patient left AMA prior to being assessed by cardiology.  Patient seen in consultation  by cardiology and Cardizem added to patient's regimen.  Patient unable to be placed on  beta-blocker due to history of cocaine use.  Patient noted to have some palpitations and lightheadedness after being given some nitroglycerin the morning of 02/19/2020, and noted to have a burst of SVT.  Cardiology discontinued nitroglycerin and patient given a bolus of normal saline as patient also noted to have diastolic blood pressures in the 40s.  Cardiology recommending cardiac MRI to be done today 02/20/2020. Continue current regimen of hydralazine, Norvasc, Cardizem, HCTZ.  Cardiology following and appreciate input and recommendations.  7.  Tobacco abuse Tobacco cessation.  Nicotine patch.   8.  Polysubstance abuse UDS noted to be positive for cocaine.  Polysubstance cessation stressed to patient.  Patient seems to be motivated for polysubstance cessation in light of his abnormal 2D echo.  Social work consult.  9.  History of alcohol abuse Patient did tell admitting physician he had not been drinking for the past 6 years.  Alcohol level negative.  Patient with no signs of withdrawal.  Continue thiamine, folic acid, multivitamin.   10.  Diabetes mellitus type 2 Hemoglobin A1c 7.1.  CBG of 294.  This morning.  Continue to hold Metformin.  Continue sliding scale insulin.  Follow.  11.  Chronic pain Stable.  Continue home pain regimen.  12.  Hypokalemia/hypomagnesemia Likely secondary to GI losses.  Potassium at 3.9.  Magnesium at 1.9.  Follow.    DVT prophylaxis: Lovenox Code Status: Full Family Communication: Updated patient.  No family at bedside. Disposition Plan:  . Patient came from: Home.            . Anticipated d/c place: Home. . Barriers to d/c OR conditions which need to be met to effect a safe d/c: Likely home when clinically improved, tolerating oral intake, finalization of urine cultures and sensitivities, improvement with palpitations, lightheadedness and clearance by cardiology.  Patient with HOCM and due to symptoms of lightheadedness, palpitations and feeling poorly  likely not safe to be discharged today.    Consultants:   Cardiology: Dr. Marlou Porch 02/18/2020  Procedures:   CT abdomen and pelvis 02/16/2020  2D echo 02/17/2020  Cardiac MRI pending 02/20/2020  Antimicrobials:   IV Rocephin 02/16/2020>>>>> 02/19/2020  Amoxicillin 02/19/2020     Subjective: Patient sitting up in chair.  Feels better this morning.  Denies any lightheadedness or palpitations today.  Denies any further dysuria which she states has improved significantly.  No abdominal pain.  Tolerating solid diet.    Objective: Vitals:   02/20/20 0355 02/20/20 0800 02/20/20 0943 02/20/20 0945  BP: (!) 155/56 (!) 149/34 (!) 149/34 (!) 149/34  Pulse: (!) 57 70    Resp: 16 18    Temp: 98.5 F (36.9 C) 98.7 F (37.1 C)    TempSrc: Oral Oral    SpO2: 95% 95%    Weight:      Height:        Intake/Output Summary (Last 24 hours) at 02/20/2020 1214 Last data filed at 02/20/2020 0016 Gross per 24 hour  Intake 742.03 ml  Output 500 ml  Net 242.03 ml   Filed Weights   02/19/20 1423  Weight: 75.8 kg    Examination:  General exam: NAD Respiratory system:  CTAB. Cardiovascular system: RRR  with 3/6 hyperdynamic systolic ejection murmur.  No JVD.  No lower extremity edema.  Gastrointestinal system: Abdomen is nontender, nondistended, soft, positive bowel sounds.  No rebound.  No guarding. Central nervous system: Alert and  oriented. No focal neurological deficits. Extremities: Symmetric 5 x 5 power. Skin: No rashes, lesions or ulcers Psychiatry: Judgement and insight appear normal. Mood & affect appropriate.     Data Reviewed: I have personally reviewed following labs and imaging studies  CBC: Recent Labs  Lab 02/16/20 1455 02/17/20 0055 02/18/20 0331 02/19/20 0341 02/20/20 0906  WBC 12.2* 14.1* 11.7* 11.2* 8.6  NEUTROABS  --   --  6.4  --  5.0  HGB 15.8 13.8 12.9* 12.4* 13.3  HCT 51.0 45.3 42.0 39.7 43.4  MCV 74.3* 75.6* 75.4* 74.5* 75.6*  PLT 324 316 282 270 286     Basic Metabolic Panel: Recent Labs  Lab 02/16/20 1455 02/17/20 0055 02/18/20 0331 02/19/20 0341 02/20/20 0906  NA 141 141 140 138 138  K 3.8 3.0* 3.6 3.6 3.9  CL 110 103 106 104 104  CO2 20* 20* 21* 22 22  GLUCOSE 156* 143* 159* 117* 219*  BUN '21 21 17 14 13  ' CREATININE 1.04 1.08 1.05 1.19 1.07  CALCIUM 9.6 9.5 8.9 9.1 9.4  MG  --  1.7 2.4 2.0 1.9  PHOS  --  2.8  --   --   --    GFR: Estimated Creatinine Clearance: 67 mL/min (by C-G formula based on SCr of 1.07 mg/dL). Liver Function Tests: Recent Labs  Lab 02/16/20 1455 02/17/20 0055  AST 27 21  ALT 26 22  ALKPHOS 62 57  BILITOT 0.8 0.9  PROT 8.0 7.2  ALBUMIN 4.0 3.7   Recent Labs  Lab 02/16/20 1455  LIPASE 23   No results for input(s): AMMONIA in the last 168 hours. Coagulation Profile: No results for input(s): INR, PROTIME in the last 168 hours. Cardiac Enzymes: Recent Labs  Lab 02/16/20 1922  CKTOTAL 61   BNP (last 3 results) No results for input(s): PROBNP in the last 8760 hours. HbA1C: No results for input(s): HGBA1C in the last 72 hours. CBG: Recent Labs  Lab 02/19/20 1934 02/19/20 2355 02/20/20 0352 02/20/20 0757 02/20/20 1150  GLUCAP 149* 101* 112* 294* 109*   Lipid Profile: No results for input(s): CHOL, HDL, LDLCALC, TRIG, CHOLHDL, LDLDIRECT in the last 72 hours. Thyroid Function Tests: No results for input(s): TSH, T4TOTAL, FREET4, T3FREE, THYROIDAB in the last 72 hours. Anemia Panel: No results for input(s): VITAMINB12, FOLATE, FERRITIN, TIBC, IRON, RETICCTPCT in the last 72 hours. Sepsis Labs: No results for input(s): PROCALCITON, LATICACIDVEN in the last 168 hours.  Recent Results (from the past 240 hour(s))  Respiratory Panel by RT PCR (Flu A&B, Covid) - Nasopharyngeal Swab     Status: None   Collection Time: 02/16/20  7:47 PM   Specimen: Nasopharyngeal Swab  Result Value Ref Range Status   SARS Coronavirus 2 by RT PCR NEGATIVE NEGATIVE Final    Comment:  (NOTE) SARS-CoV-2 target nucleic acids are NOT DETECTED. The SARS-CoV-2 RNA is generally detectable in upper respiratoy specimens during the acute phase of infection. The lowest concentration of SARS-CoV-2 viral copies this assay can detect is 131 copies/mL. A negative result does not preclude SARS-Cov-2 infection and should not be used as the sole basis for treatment or other patient management decisions. A negative result may occur with  improper specimen collection/handling, submission of specimen other than nasopharyngeal swab, presence of viral mutation(s) within the areas targeted by this assay, and inadequate number of viral copies (<131 copies/mL). A negative result must be combined with clinical observations, patient history, and epidemiological information. The expected result is Negative. Fact  Sheet for Patients:  PinkCheek.be Fact Sheet for Healthcare Providers:  GravelBags.it This test is not yet ap proved or cleared by the [place] FDA and  has been authorized for detection and/or diagnosis of SARS-CoV-2 by FDA under an Emergency Use Authorization (EUA). This EUA will remain  in effect (meaning this test can be used) for the duration of the COVID-19 declaration under Section 564(b)(1) of the Act, 21 U.S.C. section 360bbb-3(b)(1), unless the authorization is terminated or revoked sooner.    Influenza A by PCR NEGATIVE NEGATIVE Final   Influenza B by PCR NEGATIVE NEGATIVE Final    Comment: (NOTE) The Xpert Xpress SARS-CoV-2/FLU/RSV assay is intended as an aid in  the diagnosis of influenza from Nasopharyngeal swab specimens and  should not be used as a sole basis for treatment. Nasal washings and  aspirates are unacceptable for Xpert Xpress SARS-CoV-2/FLU/RSV  testing. Fact Sheet for Patients: PinkCheek.be Fact Sheet for Healthcare  Providers: GravelBags.it This test is not yet approved or cleared by the [place] FDA and  has been authorized for detection and/or diagnosis of SARS-CoV-2 by  FDA under an Emergency Use Authorization (EUA). This EUA will remain  in effect (meaning this test can be used) for the duration of the  Covid-19 declaration under Section 564(b)(1) of the Act, 21  U.S.C. section 360bbb-3(b)(1), unless the authorization is  terminated or revoked. Performed at [facility], [place] [address] [place], [place] [address]   Culture, Urine     Status: Abnormal   Collection Time: 02/16/20  9:10 PM   Specimen: Urine, Clean Catch  Result Value Ref Range Status   Specimen Description URINE, CLEAN CATCH  Final   Special Requests NONE  Final   Culture (A)  Final    70,000 COLONIES/mL DIPHTHEROIDS(CORYNEBACTERIUM SPECIES) Standardized susceptibility testing for this organism is not available. Performed at [facility], [place] [address] [place], [place] [address]    Report Status 02/17/2020 FINAL  Final  Culture, blood (Routine X 2) w Reflex to ID Panel     Status: None (Preliminary result)   Collection Time: 02/17/20 10:30 AM   Specimen: BLOOD RIGHT ARM  Result Value Ref Range Status   Specimen Description BLOOD RIGHT ARM  Final   Special Requests   Final    BOTTLES DRAWN AEROBIC AND ANAEROBIC Blood Culture adequate volume   Culture   Final    NO GROWTH 3 DAYS Performed at [facility], [place] [address], [place], [place] [address]    Report Status PENDING  Incomplete  Culture, blood (Routine X 2) w Reflex to ID Panel     Status: None (Preliminary result)   Collection Time: 02/17/20 10:35 AM   Specimen: BLOOD RIGHT HAND  Result Value Ref Range Status   Specimen Description BLOOD RIGHT HAND  Final   Special Requests   Final    BOTTLES DRAWN AEROBIC AND ANAEROBIC Blood Culture adequate volume   Culture   Final    NO GROWTH 3 DAYS Performed at [facility], [place] [address] [place], [place] [address]    Report Status PENDING  Incomplete  Culture, Urine     Status: None   Collection Time: 02/18/20  2:24 PM   Specimen: Urine, Random  Result Value Ref Range Status   Specimen Description URINE, RANDOM  Final   Special Requests NONE  Final   Culture   Final    NO GROWTH Performed at [facility]  Lab, [address] [place], [place] [address]    Report Status 02/19/2020 FINAL  Final         Radiology Studies: No results found.      Scheduled Meds: . amLODipine  10 mg Oral Daily  . aspirin EC  81 mg Oral Daily  . diltiazem  240 mg Oral Daily  . enoxaparin (LOVENOX) injection  30 mg Subcutaneous Q24H  . folic acid  1 mg Oral Daily  . gabapentin  100 mg Oral TID  . hydrALAZINE  75 mg Oral Q8H  . hydrochlorothiazide  12.5 mg Oral Daily  . insulin aspart  0-15 Units Subcutaneous Q4H  . multivitamin with minerals  1 tablet Oral Daily  . nicotine  21 mg Transdermal Daily  . pantoprazole  40 mg Oral Daily  . phenazopyridine  200 mg Oral TID WC  . pravastatin  20 mg Oral QHS  . sodium chloride flush  3 mL Intravenous Once  . sucralfate  1 g Oral TID WC & HS  . thiamine  100 mg Oral Daily   Continuous Infusions: . cefTRIAXone (ROCEPHIN)  IV Stopped (02/19/20 2121)     LOS: 1 day    Time spent: 40 minutes    Irine Seal, MD [facility]   To contact the attending provider between 7A-7P or the covering provider during after hours 7P-7A, please log into the web site [email or web address] and access using universal [place] password for that web site. If you do not have the password, please call the hospital operator.  02/20/2020, 12:14 PM

## 2020-02-22 LAB — CULTURE, BLOOD (ROUTINE X 2)
Culture: NO GROWTH
Culture: NO GROWTH
Special Requests: ADEQUATE
Special Requests: ADEQUATE

## 2020-03-07 ENCOUNTER — Encounter (HOSPITAL_COMMUNITY): Payer: Self-pay | Admitting: Emergency Medicine

## 2020-03-07 ENCOUNTER — Other Ambulatory Visit: Payer: Self-pay

## 2020-03-07 ENCOUNTER — Emergency Department (HOSPITAL_COMMUNITY)
Admission: EM | Admit: 2020-03-07 | Discharge: 2020-03-08 | Disposition: A | Discharge status: Home / Self Care | Payer: Medicare HMO | Attending: Emergency Medicine | Admitting: Emergency Medicine

## 2020-03-07 DIAGNOSIS — Z7984 Long term (current) use of oral hypoglycemic drugs: Secondary | ICD-10-CM | POA: Insufficient documentation

## 2020-03-07 DIAGNOSIS — I5032 Chronic diastolic (congestive) heart failure: Secondary | ICD-10-CM | POA: Diagnosis not present

## 2020-03-07 DIAGNOSIS — Z79899 Other long term (current) drug therapy: Secondary | ICD-10-CM | POA: Diagnosis not present

## 2020-03-07 DIAGNOSIS — M542 Cervicalgia: Secondary | ICD-10-CM | POA: Diagnosis present

## 2020-03-07 DIAGNOSIS — I110 Hypertensive heart disease with heart failure: Secondary | ICD-10-CM | POA: Insufficient documentation

## 2020-03-07 DIAGNOSIS — M62838 Other muscle spasm: Secondary | ICD-10-CM | POA: Diagnosis not present

## 2020-03-07 DIAGNOSIS — E119 Type 2 diabetes mellitus without complications: Secondary | ICD-10-CM | POA: Diagnosis not present

## 2020-03-07 DIAGNOSIS — Z7982 Long term (current) use of aspirin: Secondary | ICD-10-CM | POA: Diagnosis not present

## 2020-03-07 LAB — CBC WITH DIFFERENTIAL/PLATELET
Abs Immature Granulocytes: 0.06 10*3/uL (ref 0.00–0.07)
Basophils Absolute: 0.1 10*3/uL (ref 0.0–0.1)
Basophils Relative: 1 %
Eosinophils Absolute: 0.4 10*3/uL (ref 0.0–0.5)
Eosinophils Relative: 3 %
HCT: 42.9 % (ref 39.0–52.0)
Hemoglobin: 13.1 g/dL (ref 13.0–17.0)
Immature Granulocytes: 0 %
Lymphocytes Relative: 29 %
Lymphs Abs: 4 10*3/uL (ref 0.7–4.0)
MCH: 23 pg — ABNORMAL LOW (ref 26.0–34.0)
MCHC: 30.5 g/dL (ref 30.0–36.0)
MCV: 75.4 fL — ABNORMAL LOW (ref 80.0–100.0)
Monocytes Absolute: 0.9 10*3/uL (ref 0.1–1.0)
Monocytes Relative: 6 %
Neutro Abs: 8.3 10*3/uL — ABNORMAL HIGH (ref 1.7–7.7)
Neutrophils Relative %: 61 %
Platelets: 316 10*3/uL (ref 150–400)
RBC: 5.69 MIL/uL (ref 4.22–5.81)
RDW: 15.3 % (ref 11.5–15.5)
WBC: 13.7 10*3/uL — ABNORMAL HIGH (ref 4.0–10.5)
nRBC: 0 % (ref 0.0–0.2)

## 2020-03-07 LAB — COMPREHENSIVE METABOLIC PANEL
ALT: 29 U/L (ref 0–44)
AST: 34 U/L (ref 15–41)
Albumin: 4 g/dL (ref 3.5–5.0)
Alkaline Phosphatase: 53 U/L (ref 38–126)
Anion gap: 13 (ref 5–15)
BUN: 13 mg/dL (ref 8–23)
CO2: 21 mmol/L — ABNORMAL LOW (ref 22–32)
Calcium: 9.5 mg/dL (ref 8.9–10.3)
Chloride: 103 mmol/L (ref 98–111)
Creatinine, Ser: 1.14 mg/dL (ref 0.61–1.24)
GFR calc Af Amer: >60 mL/min (ref >60)
GFR calc non Af Amer: >60 mL/min (ref >60)
Glucose, Bld: 227 mg/dL — ABNORMAL HIGH (ref 70–99)
Potassium: 4.7 mmol/L (ref 3.5–5.1)
Sodium: 137 mmol/L (ref 135–145)
Total Bilirubin: 0.4 mg/dL (ref 0.3–1.2)
Total Protein: 7.5 g/dL (ref 6.5–8.1)

## 2020-03-07 NOTE — ED Triage Notes (Addendum)
Patient reports right lateral neck pain onset this week , denies injury , pain increases with movement ,palpation and changing positions . Patient stated he has a " tumor" in his neck several years ago . Hypertensive at triage. Consulted EDP ( Dr. Vanita Panda) advised RN to monitor pt.'s vital signs. Patient stated he has not taken his antihypertensive medication today .

## 2020-03-08 ENCOUNTER — Emergency Department (HOSPITAL_COMMUNITY): Payer: Medicare HMO

## 2020-03-08 DIAGNOSIS — M542 Cervicalgia: Secondary | ICD-10-CM | POA: Diagnosis not present

## 2020-03-08 MED ORDER — HYDROMORPHONE HCL 1 MG/ML IJ SOLN
1.0000 mg | Freq: Once | INTRAMUSCULAR | Status: AC
  Administered 2020-03-08: 1 mg via INTRAMUSCULAR
  Filled 2020-03-08: qty 1

## 2020-03-08 MED ORDER — CYCLOBENZAPRINE HCL 10 MG PO TABS: 10.0000 mg | ORAL_TABLET | Freq: Two times a day (BID) | ORAL | 0 refills | Status: DC | PRN

## 2020-03-08 NOTE — ED Notes (Signed)
Patient verbalizes understanding of discharge instructions. Opportunity for questioning and answers were provided. Armband removed by staff, pt discharged from ED.  

## 2020-03-08 NOTE — ED Notes (Signed)
Pt in xray

## 2020-03-08 NOTE — ED Provider Notes (Signed)
[facility] EMERGENCY DEPARTMENT Provider Note   CSN: [id number] Arrival date & time: 03/07/20  2042     History Chief Complaint  Patient presents with  . Neck Pain    Hypertensive    [name] is a 69 y.o. male.  The history is provided by the patient.  Neck Pain Pain location:  R side Quality:  Aching and stiffness Pain radiates to:  R shoulder Pain severity:  Severe Onset quality:  Gradual Duration:  4 days Timing:  Constant Progression:  Worsening Chronicity:  New Context: not fall and not lifting a heavy object   Relieved by:  Nothing Worsened by:  Position Associated symptoms: no bladder incontinence, no bowel incontinence, no chest pain, no fever, no headaches, no paresis, no visual change and no weakness   Patient presents with right neck pain for the past 4 days.  No trauma.  Hurts to move his neck.  No arm weakness.  No incontinence.  No leg weakness.  No chest pain shortness of breath.  No headache or visual changes.  No history of neck surgery.  He told nursing he had a neck tumor, but on further evaluation it is a pituitary adenoma.     Past Medical History:  Diagnosis Date  . Alcohol dependence ([place]) 12/10/2013  . Angioedema of lips 12/24/2013  . Brain tumor ([place])   . CHF (congestive heart failure) (HCC)    diastolic  . Chronic back pain   . Chronic headache   . CVA (cerebral vascular accident) ([place])   . Diabetes mellitus   . 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[place]) 02/18/2020  . HTN (hypertension), malignant 09/06/2012  . Hypertension   . Narcotic abuse ([place])   . Normal cardiac stress test 2009  . TIA (transient ischemic attack) 09/06/2012  . Tobacco use disorder 12/10/2013  . UTI (urinary tract infection)   . Vertigo 09/06/2012    Patient Active Problem List   Diagnosis Date Noted  . Complicated UTI (urinary tract infection) 02/18/2020  . 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[place]) 02/18/2020  .  Epigastric abdominal pain   . Epigastric pain 02/16/2020  . Hypertensive urgency 02/16/2020  . Cardiac murmur 02/16/2020  . Acute pyelonephritis 02/16/2020  . Syncope 01/23/2017  . Elevated troponin   . Nausea 11/01/2016  . Dysuria 11/01/2016  . Chronic diastolic heart failure ([place]) 03/20/2015  . Chest pain 03/18/2015  . History of TIA (transient ischemic attack) 01/27/2015  . Enterococcus UTI 01/27/2015  . Cough 01/27/2015  . Bronchitis 01/27/2015  . HCAP (healthcare-associated pneumonia)   . CHF (congestive heart failure) ([place]) 01/26/2015  . History of alcohol abuse 01/26/2015  . Pyelonephritis 01/12/2015  . Prostatitis 01/12/2015  . Essential hypertension 11/03/2014  . Hypokalemia 11/03/2014  . Fever 08/15/2014  . Chronic pain 12/24/2013  . Tobacco use disorder 12/10/2013  . Leukocytosis 12/10/2013  . Diabetes mellitus type 2, controlled ([place]) 09/06/2012  . Pain in the side 09/06/2012  . Pituitary adenoma ([place]) 09/06/2012    Past Surgical History:  Procedure Laterality Date  . NO PAST SURGERIES         Family History  Problem Relation Age of Onset  . CAD Father        "heart attack in his 4's"  . Hypertension Father   . Diabetes Brother   . Diabetes Sister   . Hypertension Mother     Social History   Tobacco Use  . Smoking status: Current Every Day Smoker  Packs/day: 0.50    Types: Cigarettes  . Smokeless tobacco: Current User  Substance Use Topics  . Alcohol use: Yes  . Drug use: Yes    Types: Marijuana, Cocaine, IV    Comment: Heroin - one month ago    Home Medications Prior to Admission medications   Medication Sig Start Date End Date Taking? Authorizing Provider  albuterol (PROVENTIL HFA;VENTOLIN HFA) 108 (90 Base) MCG/ACT inhaler Inhale 1-2 puffs into the lungs every 6 (six) hours as needed for wheezing. 09/04/16   Tanna Furry, MD  amLODipine (NORVASC) 10 MG tablet Take 10 mg by mouth daily.    [provider]  aspirin EC 81 MG  tablet Take 81 mg by mouth daily.    [provider]  cyclobenzaprine (FLEXERIL) 10 MG tablet Take 1 tablet (10 mg total) by mouth 2 (two) times daily as needed for muscle spasms. 03/08/20   Ripley Fraise, MD  diltiazem (CARDIZEM CD) 240 MG 24 hr capsule Take 1 capsule (240 mg total) by mouth daily. 02/21/20   Eugenie Filler, MD  docusate sodium (COLACE) 100 MG capsule Take 1 capsule (100 mg total) by mouth every 12 (twelve) hours. Patient taking differently: Take 100 mg by mouth daily.  01/02/18   Little, Wenda Overland, MD  folic acid (FOLVITE) 1 MG tablet Take 1 tablet (1 mg total) by mouth daily. 02/21/20   Eugenie Filler, MD  gabapentin (NEURONTIN) 100 MG capsule Take 100 mg by mouth 3 (three) times daily. 06/10/16   [provider]  hydrALAZINE (APRESOLINE) 25 MG tablet Take 3 tablets (75 mg total) by mouth every 8 (eight) hours. 02/20/20   Eugenie Filler, MD  hydrochlorothiazide (MICROZIDE) 12.5 MG capsule Take 1 capsule (12.5 mg total) by mouth daily. 02/20/20   Eugenie Filler, MD  ibuprofen (ADVIL,MOTRIN) 800 MG tablet Take 0.5 tablets (400 mg total) by mouth every 6 (six) hours as needed (pain). 06/16/18   Fawze, Mina A, PA-C  metFORMIN (GLUCOPHAGE) 500 MG tablet Take 500 mg by mouth 2 (two) times daily with a meal.    [provider]  Multiple Vitamin (MULTIVITAMIN WITH MINERALS) TABS tablet Take 1 tablet by mouth daily. 02/21/20   Eugenie Filler, MD  nicotine (NICODERM CQ - DOSED IN MG/24 HOURS) 21 mg/24hr patch Place 1 patch (21 mg total) onto the skin daily. 02/21/20   Eugenie Filler, MD  pantoprazole (PROTONIX) 40 MG tablet Take 1 tablet (40 mg total) by mouth daily. 02/20/20   Eugenie Filler, MD  pravastatin (PRAVACHOL) 20 MG tablet Take 20 mg by mouth at bedtime.    [provider]  sucralfate (CARAFATE) 1 GM/10ML suspension Take 10 mLs (1 g total) by mouth 4 (four) times daily -  with meals and at bedtime. 02/20/20   Eugenie Filler, MD  thiamine 100 MG tablet Take 1 tablet (100 mg total) by mouth daily. 02/21/20   Eugenie Filler, MD  Vitamin D, Ergocalciferol, (DRISDOL) 1.25 MG (50000 UNIT) CAPS capsule Take 50,000 Units by mouth once a week. Mondays 02/14/20   [provider]    Allergies    Patient has no known allergies.  Review of Systems   Review of Systems  Constitutional: Negative for fever.  Eyes: Negative for visual disturbance.  Respiratory: Negative for shortness of breath.   Cardiovascular: Negative for chest pain.  Gastrointestinal: Negative for bowel incontinence.  Genitourinary: Negative for bladder incontinence.  Musculoskeletal: Positive for neck pain.  Neurological:  Negative for weakness and headaches.  All other systems reviewed and are negative.   Physical Exam Updated Vital Signs BP (!) 177/58   Pulse 68   Temp 98.7 F (37.1 C) (Oral)   Resp 18   Ht 1.753 m (5\' 9" )   Wt 80 kg   SpO2 98%   BMI 26.05 kg/m   Physical Exam CONSTITUTIONAL: Well developed/well nourished HEAD: Normocephalic/atraumatic EYES: EOMI/PERRL ENMT: Mucous membranes moist NECK: supple no meningeal signs, no bruits SPINE/BACK:entire spine nontender, right cervical paraspinal tenderness, no erythema, no abscess CV: S1/S2 noted, no murmurs/rubs/gallops noted LUNGS: Lungs are clear to auscultation bilaterally, no apparent distress ABDOMEN: soft, nontender NEURO: Pt is awake/alert/appropriate, moves all extremitiesx4.  No facial droop.   Equal power (5/5) with hand grip, wrist flex/extension, elbow flex/extension, and equal power with shoulder abduction/adduction.  No focal sensory deficit to light touch is noted in either UE.   Equal (2+) biceps/brachioradialis reflex in bilateral UE EXTREMITIES: pulses normal/equal, full ROM SKIN: warm, color normal PSYCH: no abnormalities of mood noted, alert and oriented to situation  ED Results / Procedures / Treatments   Labs (all labs ordered are  listed, but only abnormal results are displayed) Labs Reviewed  CBC WITH DIFFERENTIAL/PLATELET - Abnormal; Notable for the following components:      Result Value   WBC 13.7 (*)    MCV 75.4 (*)    MCH 23.0 (*)    Neutro Abs 8.3 (*)    All other components within normal limits  COMPREHENSIVE METABOLIC PANEL - Abnormal; Notable for the following components:   CO2 21 (*)    Glucose, Bld 227 (*)    All other components within normal limits    EKG EKG Interpretation  Date/Time:  Wednesday March 07 2020 20:50:36 EST Ventricular Rate:  84 PR Interval:  160 QRS Duration: 78 QT Interval:  388 QTC Calculation: 458 R Axis:   -31 Text Interpretation: Normal sinus rhythm Possible Left atrial enlargement Left axis deviation Septal infarct , age undetermined Abnormal ECG Interpretation limited secondary to artifact Otherwise no significant change Confirmed by Ripley Fraise [phone]) on 03/08/2020 12:50:11 AM   Radiology DG Cervical Spine Complete  Result Date: 03/08/2020 CLINICAL DATA:  Pain EXAM: CERVICAL SPINE - COMPLETE 4+ VIEW COMPARISON:  None. FINDINGS: There is no displaced fracture. No dislocation. Degenerative changes are noted at the C4-C5, C5-C6, and C6-C7 levels. Bilateral osseous neural foraminal is narrowing at multiple levels. IMPRESSION: No displaced fracture or dislocation. Degenerative changes of the cervical spine as above. Electronically Signed   By: Constance Holster M.D.   On: 03/08/2020 02:43    Procedures Procedures   Medications Ordered in ED Medications  HYDROmorphone (DILAUDID) injection 1 mg (1 mg Intramuscular Given 03/08/20 0125)    ED Course  I have reviewed the triage vital signs and the nursing notes.  Pertinent labs & imaging results that were available during my care of the patient were reviewed by me and considered in my medical decision making (see chart for details).    MDM Rules/Calculators/A&P                      Patient had neck pain for 4  days, likely due to musculoskeletal strain.  No acute neuro deficits.  Overall well appearing He is improved.  Will place on muscle relaxant, follow with PCP.  Discussed strict return precautions Final Clinical Impression(s) / ED Diagnoses Final diagnoses:  Neck pain  Muscle spasms of neck  Rx / DC Orders ED Discharge Orders         Ordered    cyclobenzaprine (FLEXERIL) 10 MG tablet  2 times daily PRN     03/08/20 0242           Ripley Fraise, MD 03/08/20 0246

## 2020-03-08 NOTE — Discharge Instructions (Addendum)
You have neck pain, possibly from a cervical strain and/or pinched nerve.  ° °SEEK IMMEDIATE MEDICAL ATTENTION IF: °You develop difficulties swallowing or breathing.  °You have new or worse numbness, weakness, tingling, or movement problems in your arms or legs.  °You develop increasing pain which is uncontrolled with medications.  °You have change in bowel or bladder function, or other concerns. ° ° ° °

## 2020-08-01 ENCOUNTER — Emergency Department (HOSPITAL_COMMUNITY): Payer: Medicare HMO

## 2020-08-01 ENCOUNTER — Emergency Department (HOSPITAL_COMMUNITY)
Admission: EM | Admit: 2020-08-01 | Discharge: 2020-08-02 | Disposition: A | Discharge status: Home / Self Care | Payer: Medicare HMO | Attending: Emergency Medicine | Admitting: Emergency Medicine

## 2020-08-01 ENCOUNTER — Other Ambulatory Visit: Payer: Self-pay

## 2020-08-01 DIAGNOSIS — Z79899 Other long term (current) drug therapy: Secondary | ICD-10-CM | POA: Insufficient documentation

## 2020-08-01 DIAGNOSIS — Z7984 Long term (current) use of oral hypoglycemic drugs: Secondary | ICD-10-CM | POA: Insufficient documentation

## 2020-08-01 DIAGNOSIS — J181 Lobar pneumonia, unspecified organism: Secondary | ICD-10-CM | POA: Diagnosis not present

## 2020-08-01 DIAGNOSIS — E119 Type 2 diabetes mellitus without complications: Secondary | ICD-10-CM | POA: Diagnosis not present

## 2020-08-01 DIAGNOSIS — R0789 Other chest pain: Secondary | ICD-10-CM | POA: Diagnosis present

## 2020-08-01 DIAGNOSIS — I110 Hypertensive heart disease with heart failure: Secondary | ICD-10-CM | POA: Diagnosis not present

## 2020-08-01 DIAGNOSIS — Z20822 Contact with and (suspected) exposure to covid-19: Secondary | ICD-10-CM | POA: Insufficient documentation

## 2020-08-01 DIAGNOSIS — R42 Dizziness and giddiness: Secondary | ICD-10-CM | POA: Diagnosis not present

## 2020-08-01 DIAGNOSIS — Z8673 Personal history of transient ischemic attack (TIA), and cerebral infarction without residual deficits: Secondary | ICD-10-CM | POA: Diagnosis not present

## 2020-08-01 DIAGNOSIS — F17210 Nicotine dependence, cigarettes, uncomplicated: Secondary | ICD-10-CM | POA: Insufficient documentation

## 2020-08-01 DIAGNOSIS — I5032 Chronic diastolic (congestive) heart failure: Secondary | ICD-10-CM | POA: Insufficient documentation

## 2020-08-01 DIAGNOSIS — J189 Pneumonia, unspecified organism: Secondary | ICD-10-CM

## 2020-08-01 LAB — BASIC METABOLIC PANEL
Anion gap: 12 (ref 5–15)
BUN: 10 mg/dL (ref 8–23)
CO2: 20 mmol/L — ABNORMAL LOW (ref 22–32)
Calcium: 9.2 mg/dL (ref 8.9–10.3)
Chloride: 103 mmol/L (ref 98–111)
Creatinine, Ser: 1 mg/dL (ref 0.61–1.24)
GFR calc Af Amer: >60 mL/min (ref >60)
GFR calc non Af Amer: >60 mL/min (ref >60)
Glucose, Bld: 234 mg/dL — ABNORMAL HIGH (ref 70–99)
Potassium: 3.9 mmol/L (ref 3.5–5.1)
Sodium: 135 mmol/L (ref 135–145)

## 2020-08-01 LAB — CBC
HCT: 41.7 % (ref 39.0–52.0)
Hemoglobin: 12.6 g/dL — ABNORMAL LOW (ref 13.0–17.0)
MCH: 22.6 pg — ABNORMAL LOW (ref 26.0–34.0)
MCHC: 30.2 g/dL (ref 30.0–36.0)
MCV: 74.7 fL — ABNORMAL LOW (ref 80.0–100.0)
Platelets: 302 10*3/uL (ref 150–400)
RBC: 5.58 MIL/uL (ref 4.22–5.81)
RDW: 14.2 % (ref 11.5–15.5)
WBC: 11.2 10*3/uL — ABNORMAL HIGH (ref 4.0–10.5)
nRBC: 0 % (ref 0.0–0.2)

## 2020-08-01 LAB — TROPONIN I (HIGH SENSITIVITY): Troponin I (High Sensitivity): 10 ng/L (ref <18)

## 2020-08-01 MED ORDER — SODIUM CHLORIDE 0.9% FLUSH: 3.0000 mL | Freq: Once | INTRAVENOUS | Status: DC

## 2020-08-01 NOTE — ED Triage Notes (Signed)
Pt presents to ED POV. Pt c/o generalized CP that radiates to shoulder. Pain is a 7/10. Pt also c/o dizziness, weakness, lightheaded, nauseous.

## 2020-08-02 ENCOUNTER — Emergency Department (HOSPITAL_COMMUNITY): Payer: Medicare HMO

## 2020-08-02 DIAGNOSIS — J181 Lobar pneumonia, unspecified organism: Secondary | ICD-10-CM | POA: Diagnosis not present

## 2020-08-02 LAB — URINALYSIS, ROUTINE W REFLEX MICROSCOPIC
Bilirubin Urine: NEGATIVE
Glucose, UA: NEGATIVE mg/dL
Ketones, ur: NEGATIVE mg/dL
Nitrite: NEGATIVE
Protein, ur: NEGATIVE mg/dL
Specific Gravity, Urine: 1.015 (ref 1.005–1.030)
pH: 6 (ref 5.0–8.0)

## 2020-08-02 LAB — SARS CORONAVIRUS 2 BY RT PCR (HOSPITAL ORDER, PERFORMED IN ~~LOC~~ HOSPITAL LAB): SARS Coronavirus 2: NEGATIVE

## 2020-08-02 LAB — URINALYSIS, MICROSCOPIC (REFLEX): WBC, UA: >50 WBC/hpf (ref 0–5)

## 2020-08-02 LAB — BRAIN NATRIURETIC PEPTIDE: B Natriuretic Peptide: 94.1 pg/mL (ref 0.0–100.0)

## 2020-08-02 LAB — TROPONIN I (HIGH SENSITIVITY): Troponin I (High Sensitivity): 10 ng/L (ref <18)

## 2020-08-02 MED ORDER — OXYCODONE-ACETAMINOPHEN 5-325 MG PO TABS
1.0000 | ORAL_TABLET | Freq: Once | ORAL | Status: AC
  Administered 2020-08-02: 1 via ORAL
  Filled 2020-08-02: qty 1

## 2020-08-02 MED ORDER — IOHEXOL 350 MG/ML SOLN
75.0000 mL | Freq: Once | INTRAVENOUS | Status: AC | PRN
  Administered 2020-08-02: 75 mL via INTRAVENOUS

## 2020-08-02 MED ORDER — DOXYCYCLINE HYCLATE 100 MG PO CAPS: 100.0000 mg | ORAL_CAPSULE | Freq: Two times a day (BID) | ORAL | 0 refills | Status: AC

## 2020-08-02 NOTE — ED Notes (Signed)
Pt refusing repeat VS. AVS reviewed with pt. Pt ambulatory out of dept

## 2020-08-02 NOTE — Discharge Instructions (Signed)
We believe that your symptoms are caused today by pneumonia, an infection in your lung(s).  Fortunately you should start to improve quickly after taking your antibiotics.  Please take the full course of antibiotics as prescribed and drink plenty of fluids.   ° °Follow up with your doctor within 1-2 days.  If you develop any new or worsening symptoms, including but not limited to fever in spite of taking over-the-counter ibuprofen and/or Tylenol, persistent vomiting, worsening shortness of breath, or other symptoms that concern you, please return to the Emergency Department immediately.  ° °

## 2020-08-02 NOTE — ED Provider Notes (Signed)
[facility] EMERGENCY DEPARTMENT Provider Note   CSN: [id number] Arrival date & time: 08/01/20  2045     History Chief Complaint  Patient presents with  . Chest Pain    [name] is a 69 y.o. male presents for chest pain that started yesterday morning. States that 2 days ago he had a nonproductive cough with SOB, generalized weakness, and nausea that is slowly improving since yesterday. Yesterday morning however, he noticed sharp intermittent left chest pain.. About 20 minutes the chest pain improved after getting up and walking, but feels pain when taking a deep breath. He also notes dysuria starting about 2 days ago as well and is concerned he has a UTI. Denies recent unprotected sex. He denies fever, chills, abdominal pain, constipation, and hematuria. States he has had both doses of COVID vaccine.  Past Medical History:  Diagnosis Date  . Alcohol dependence ([place]) 12/10/2013  . Angioedema of lips 12/24/2013  . Brain tumor ([place])   . CHF (congestive heart failure) (HCC)    diastolic  . Chronic back pain   . Chronic headache   . CVA (cerebral vascular accident) ([place])   . Diabetes mellitus   . 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[place]) 02/18/2020  . HTN (hypertension), malignant 09/06/2012  . Hypertension   . Narcotic abuse ([place])   . Normal cardiac stress test 2009  . TIA (transient ischemic attack) 09/06/2012  . Tobacco use disorder 12/10/2013  . UTI (urinary tract infection)   . Vertigo 09/06/2012    Patient Active Problem List   Diagnosis Date Noted  . Complicated UTI (urinary tract infection) 02/18/2020  . 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[place]) 02/18/2020  . Epigastric abdominal pain   . Epigastric pain 02/16/2020  . Hypertensive urgency 02/16/2020  . Cardiac murmur 02/16/2020  . Acute pyelonephritis 02/16/2020  . Syncope 01/23/2017  . Elevated troponin   . Nausea 11/01/2016  . Dysuria 11/01/2016  . Chronic diastolic heart  failure ([place]) 03/20/2015  . Chest pain 03/18/2015  . History of TIA (transient ischemic attack) 01/27/2015  . Enterococcus UTI 01/27/2015  . Cough 01/27/2015  . Bronchitis 01/27/2015  . HCAP (healthcare-associated pneumonia)   . CHF (congestive heart failure) ([place]) 01/26/2015  . History of alcohol abuse 01/26/2015  . Pyelonephritis 01/12/2015  . Prostatitis 01/12/2015  . Essential hypertension 11/03/2014  . Hypokalemia 11/03/2014  . Fever 08/15/2014  . Chronic pain 12/24/2013  . Tobacco use disorder 12/10/2013  . Leukocytosis 12/10/2013  . Diabetes mellitus type 2, controlled ([place]) 09/06/2012  . Pain in the side 09/06/2012  . Pituitary adenoma ([place]) 09/06/2012    Past Surgical History:  Procedure Laterality Date  . NO PAST SURGERIES         Family History  Problem Relation Age of Onset  . CAD Father        "heart attack in his 41's"  . Hypertension Father   . Diabetes Brother   . Diabetes Sister   . Hypertension Mother     Social History   Tobacco Use  . Smoking status: Current Every Day Smoker    Packs/day: 0.50    Types: Cigarettes  . Smokeless tobacco: Current User  Vaping Use  . Vaping Use: Never used  Substance Use Topics  . Alcohol use: Yes  . Drug use: Yes    Types: Marijuana, Cocaine, IV    Comment: Heroin - one month ago    Home Medications Prior to Admission medications   Medication Sig Start Date  End Date Taking? Authorizing Provider  albuterol (PROVENTIL HFA;VENTOLIN HFA) 108 (90 Base) MCG/ACT inhaler Inhale 1-2 puffs into the lungs every 6 (six) hours as needed for wheezing. 09/04/16   Tanna Furry, MD  amLODipine (NORVASC) 10 MG tablet Take 10 mg by mouth daily.    [provider]  aspirin EC 81 MG tablet Take 81 mg by mouth daily.    [provider]  cyclobenzaprine (FLEXERIL) 10 MG tablet Take 1 tablet (10 mg total) by mouth 2 (two) times daily as needed for muscle spasms. 03/08/20   Ripley Fraise, MD  diltiazem (CARDIZEM  CD) 240 MG 24 hr capsule Take 1 capsule (240 mg total) by mouth daily. 02/21/20   Eugenie Filler, MD  docusate sodium (COLACE) 100 MG capsule Take 1 capsule (100 mg total) by mouth every 12 (twelve) hours. Patient taking differently: Take 100 mg by mouth daily.  01/02/18   Little, Wenda Overland, MD  folic acid (FOLVITE) 1 MG tablet Take 1 tablet (1 mg total) by mouth daily. 02/21/20   Eugenie Filler, MD  gabapentin (NEURONTIN) 100 MG capsule Take 100 mg by mouth 3 (three) times daily. 06/10/16   [provider]  hydrALAZINE (APRESOLINE) 25 MG tablet Take 3 tablets (75 mg total) by mouth every 8 (eight) hours. 02/20/20   Eugenie Filler, MD  hydrochlorothiazide (MICROZIDE) 12.5 MG capsule Take 1 capsule (12.5 mg total) by mouth daily. 02/20/20   Eugenie Filler, MD  ibuprofen (ADVIL,MOTRIN) 800 MG tablet Take 0.5 tablets (400 mg total) by mouth every 6 (six) hours as needed (pain). 06/16/18   Fawze, Mina A, PA-C  metFORMIN (GLUCOPHAGE) 500 MG tablet Take 500 mg by mouth 2 (two) times daily with a meal.    [provider]  Multiple Vitamin (MULTIVITAMIN WITH MINERALS) TABS tablet Take 1 tablet by mouth daily. 02/21/20   Eugenie Filler, MD  nicotine (NICODERM CQ - DOSED IN MG/24 HOURS) 21 mg/24hr patch Place 1 patch (21 mg total) onto the skin daily. 02/21/20   Eugenie Filler, MD  pantoprazole (PROTONIX) 40 MG tablet Take 1 tablet (40 mg total) by mouth daily. 02/20/20   Eugenie Filler, MD  pravastatin (PRAVACHOL) 20 MG tablet Take 20 mg by mouth at bedtime.    [provider]  sucralfate (CARAFATE) 1 GM/10ML suspension Take 10 mLs (1 g total) by mouth 4 (four) times daily -  with meals and at bedtime. 02/20/20   Eugenie Filler, MD  thiamine 100 MG tablet Take 1 tablet (100 mg total) by mouth daily. 02/21/20   Eugenie Filler, MD  Vitamin D, Ergocalciferol, (DRISDOL) 1.25 MG (50000 UNIT) CAPS capsule Take 50,000 Units by mouth once a week. Mondays 02/14/20    [provider]    Allergies    Patient has no known allergies.  Review of Systems   Review of Systems  Constitutional: Negative for chills and fever.  HENT: Negative for ear pain and sore throat.   Eyes: Negative for pain and discharge.  Respiratory: Positive for cough and shortness of breath.   Cardiovascular: Positive for chest pain. Negative for palpitations.  Gastrointestinal: Positive for nausea. Negative for abdominal pain and diarrhea.  Endocrine: Negative for polydipsia and polyphagia.  Genitourinary: Positive for dysuria. Negative for hematuria.  Musculoskeletal: Negative for arthralgias and back pain.  Skin: Negative for pallor and rash.  Neurological: Positive for weakness and light-headedness. Negative for dizziness.  Psychiatric/Behavioral: Negative for agitation and confusion.  Physical Exam Updated Vital Signs BP (!) 168/75 (BP Location: Left Arm)   Pulse 65   Temp 99.3 F (37.4 C) (Oral)   Resp 17   SpO2 95%   Physical Exam Constitutional:      Appearance: He is normal weight.  HENT:     Head: Normocephalic and atraumatic.  Eyes:     Extraocular Movements: Extraocular movements intact.     Pupils: Pupils are equal, round, and reactive to light.  Cardiovascular:     Rate and Rhythm: Normal rate and regular rhythm.     Heart sounds: Normal heart sounds.  Pulmonary:     Breath sounds: No stridor.     Comments: Pail with inhalation, splinting Chest:     Chest wall: No mass.     Comments: reproducible pain on palpation of left lower chest wall Abdominal:     General: Bowel sounds are normal.     Palpations: Abdomen is soft.  Musculoskeletal:        General: Normal range of motion.     Cervical back: Normal range of motion and neck supple.  Skin:    General: Skin is warm and dry.     Capillary Refill: Capillary refill takes less than 2 seconds.  Neurological:     General: No focal deficit present.     Mental Status: He is alert and  oriented to person, place, and time.     ED Results / Procedures / Treatments   Labs (all labs ordered are listed, but only abnormal results are displayed) Labs Reviewed  BASIC METABOLIC PANEL - Abnormal; Notable for the following components:      Result Value   CO2 20 (*)    Glucose, Bld 234 (*)    All other components within normal limits  CBC - Abnormal; Notable for the following components:   WBC 11.2 (*)    Hemoglobin 12.6 (*)    MCV 74.7 (*)    MCH 22.6 (*)    All other components within normal limits  SARS CORONAVIRUS 2 BY RT PCR (HOSPITAL ORDER, [place] [facility])  BRAIN NATRIURETIC PEPTIDE  TROPONIN I (HIGH SENSITIVITY)  TROPONIN I (HIGH SENSITIVITY)    EKG EKG Interpretation  Date/Time:  Thursday August 02 2020 00:04:09 EDT Ventricular Rate:  74 PR Interval:  162 QRS Duration: 76 QT Interval:  384 QTC Calculation: 426 R Axis:   14 Text Interpretation: Normal sinus rhythm Septal infarct , age undetermined Abnormal ECG No STEMI Confirmed by Nanda Quinton [phone]) on 08/02/2020 11:58:53 AM   Radiology DG Chest 2 View  Result Date: 08/01/2020 CLINICAL DATA:  Chest pain EXAM: CHEST - 2 VIEW COMPARISON:  01/23/2017 FINDINGS: Right lung is clear. Streaky left lower lobe airspace consolidation. No pleural effusion. No pneumothorax. Normal heart size. IMPRESSION: Streaky left lower lobe airspace consolidation suspicious for pneumonia. Radiographic follow-up to resolution is recommended Electronically Signed   By: Donavan Foil M.D.   On: 08/01/2020 21:21    Procedures Procedures (including critical care time)  Medications Ordered in ED Medications  sodium chloride flush (NS) 0.9 % injection 3 mL (has no administration in time range)    ED Course  I have reviewed the triage vital signs and the nursing notes.  Pertinent labs & imaging results that were available during my care of the patient were reviewed by me and considered in my medical decision  making (see chart for details).  Clinical Course as of Aug 02 1204  Thu Aug 02, 2020  1128 ED EKG [JL]  1203 BUN: 10 [JL]    Clinical Course User Index [JL] Iona Beard, MD   MDM Rules/Calculators/A&P                          69 y/o male with history of CHF and complicated UTI presenting for 2 days aof cough, SOB with associated pleuritic chest pain concerning for PE vs COVID vs pneumonia. Afebrile, HDS, COVID negative. CT angio negative for PE will discharge home on doxycyline for pneumonia with follow up with PCP.   Final Clinical Impression(s) / ED Diagnoses Final diagnoses:  None    Rx / DC Orders ED Discharge Orders    None       Iona Beard, MD 08/02/20 1506    Margette Fast, MD 08/04/20 [phone]

## 2020-08-03 LAB — GC/CHLAMYDIA PROBE AMP (~~LOC~~) NOT AT ARMC
Chlamydia: NEGATIVE
Comment: NEGATIVE
Comment: NORMAL
Neisseria Gonorrhea: NEGATIVE

## 2020-08-03 LAB — URINE CULTURE: Culture: <10000 — AB

## 2020-09-24 ENCOUNTER — Emergency Department (HOSPITAL_COMMUNITY)
Admission: EM | Admit: 2020-09-24 | Discharge: 2020-09-24 | Disposition: A | Discharge status: Home / Self Care | Payer: Medicare HMO | Attending: Emergency Medicine | Admitting: Emergency Medicine

## 2020-09-24 ENCOUNTER — Other Ambulatory Visit: Payer: Self-pay

## 2020-09-24 DIAGNOSIS — Z79899 Other long term (current) drug therapy: Secondary | ICD-10-CM | POA: Insufficient documentation

## 2020-09-24 DIAGNOSIS — F17210 Nicotine dependence, cigarettes, uncomplicated: Secondary | ICD-10-CM | POA: Diagnosis not present

## 2020-09-24 DIAGNOSIS — Z7984 Long term (current) use of oral hypoglycemic drugs: Secondary | ICD-10-CM | POA: Diagnosis not present

## 2020-09-24 DIAGNOSIS — Z7982 Long term (current) use of aspirin: Secondary | ICD-10-CM | POA: Insufficient documentation

## 2020-09-24 DIAGNOSIS — E119 Type 2 diabetes mellitus without complications: Secondary | ICD-10-CM | POA: Diagnosis not present

## 2020-09-24 DIAGNOSIS — F1290 Cannabis use, unspecified, uncomplicated: Secondary | ICD-10-CM | POA: Diagnosis not present

## 2020-09-24 DIAGNOSIS — Z8673 Personal history of transient ischemic attack (TIA), and cerebral infarction without residual deficits: Secondary | ICD-10-CM | POA: Diagnosis not present

## 2020-09-24 DIAGNOSIS — N390 Urinary tract infection, site not specified: Secondary | ICD-10-CM | POA: Diagnosis not present

## 2020-09-24 DIAGNOSIS — Z87440 Personal history of urinary (tract) infections: Secondary | ICD-10-CM | POA: Insufficient documentation

## 2020-09-24 DIAGNOSIS — I110 Hypertensive heart disease with heart failure: Secondary | ICD-10-CM | POA: Diagnosis not present

## 2020-09-24 DIAGNOSIS — I5032 Chronic diastolic (congestive) heart failure: Secondary | ICD-10-CM | POA: Insufficient documentation

## 2020-09-24 DIAGNOSIS — R300 Dysuria: Secondary | ICD-10-CM | POA: Diagnosis present

## 2020-09-24 DIAGNOSIS — N323 Diverticulum of bladder: Secondary | ICD-10-CM | POA: Diagnosis not present

## 2020-09-24 LAB — COMPREHENSIVE METABOLIC PANEL
ALT: 36 U/L (ref 0–44)
AST: 38 U/L (ref 15–41)
Albumin: 3.8 g/dL (ref 3.5–5.0)
Alkaline Phosphatase: 75 U/L (ref 38–126)
Anion gap: 10 (ref 5–15)
BUN: 20 mg/dL (ref 8–23)
CO2: 21 mmol/L — ABNORMAL LOW (ref 22–32)
Calcium: 9.9 mg/dL (ref 8.9–10.3)
Chloride: 105 mmol/L (ref 98–111)
Creatinine, Ser: 1.18 mg/dL (ref 0.61–1.24)
GFR calc Af Amer: >60 mL/min (ref >60)
GFR calc non Af Amer: >60 mL/min (ref >60)
Glucose, Bld: 184 mg/dL — ABNORMAL HIGH (ref 70–99)
Potassium: 4.2 mmol/L (ref 3.5–5.1)
Sodium: 136 mmol/L (ref 135–145)
Total Bilirubin: 0.5 mg/dL (ref 0.3–1.2)
Total Protein: 7.9 g/dL (ref 6.5–8.1)

## 2020-09-24 LAB — URINALYSIS, ROUTINE W REFLEX MICROSCOPIC
Bilirubin Urine: NEGATIVE
Glucose, UA: NEGATIVE mg/dL
Ketones, ur: NEGATIVE mg/dL
Nitrite: NEGATIVE
Protein, ur: 100 mg/dL — AB
RBC / HPF: >50 RBC/hpf — ABNORMAL HIGH (ref 0–5)
Specific Gravity, Urine: 1.013 (ref 1.005–1.030)
WBC, UA: >50 WBC/hpf — ABNORMAL HIGH (ref 0–5)
pH: 8 (ref 5.0–8.0)

## 2020-09-24 LAB — CBC
HCT: 50.6 % (ref 39.0–52.0)
Hemoglobin: 15.1 g/dL (ref 13.0–17.0)
MCH: 22.4 pg — ABNORMAL LOW (ref 26.0–34.0)
MCHC: 29.8 g/dL — ABNORMAL LOW (ref 30.0–36.0)
MCV: 75 fL — ABNORMAL LOW (ref 80.0–100.0)
Platelets: 311 10*3/uL (ref 150–400)
RBC: 6.75 MIL/uL — ABNORMAL HIGH (ref 4.22–5.81)
RDW: 16 % — ABNORMAL HIGH (ref 11.5–15.5)
WBC: 13.4 10*3/uL — ABNORMAL HIGH (ref 4.0–10.5)
nRBC: 0 % (ref 0.0–0.2)

## 2020-09-24 MED ORDER — SODIUM CHLORIDE 0.9 % IV BOLUS
500.0000 mL | Freq: Once | INTRAVENOUS | Status: AC
  Administered 2020-09-24: 500 mL via INTRAVENOUS

## 2020-09-24 MED ORDER — ONDANSETRON HCL 4 MG/2ML IJ SOLN
4.0000 mg | Freq: Once | INTRAMUSCULAR | Status: AC
  Administered 2020-09-24: 4 mg via INTRAVENOUS
  Filled 2020-09-24: qty 2

## 2020-09-24 MED ORDER — CEPHALEXIN 500 MG PO CAPS: 500.0000 mg | ORAL_CAPSULE | Freq: Two times a day (BID) | ORAL | 0 refills | Status: DC

## 2020-09-24 MED ORDER — SODIUM CHLORIDE 0.9 % IV SOLN
1.0000 g | Freq: Once | INTRAVENOUS | Status: AC
  Administered 2020-09-24: 1 g via INTRAVENOUS
  Filled 2020-09-24: qty 10

## 2020-09-24 MED ORDER — ONDANSETRON 4 MG PO TBDP: 4.0000 mg | ORAL_TABLET | Freq: Three times a day (TID) | ORAL | 0 refills | Status: DC | PRN

## 2020-09-24 NOTE — ED Provider Notes (Signed)
[facility] EMERGENCY DEPARTMENT Provider Note   CSN: [id number] Arrival date & time: 09/24/20  0848     History No chief complaint on file.   [name] is a 69 y.o. male.  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  Patient states that for the past 2 days he has had abdominal pain, burning with urination, vomiting with urination.  He states that he has noted a little bit of blood in the urine as well.  Symptoms similar to previous UTI.  Patient denies chest pain, cough, shortness of breath or back pain.  He denies having a fever.  No treatments prior to arrival.  No penile discharge or scrotal swelling.  Onset of symptoms acute.  Course is constant.  States that he has follow-up with urology but not till next month.        Past Medical History:  Diagnosis Date  . Alcohol dependence ([place]) 12/10/2013  . Angioedema of lips 12/24/2013  . Brain tumor ([place])   . CHF (congestive heart failure) ([facility])    diastolic  . Chronic back pain   . Chronic headache   . CVA (cerebral vascular accident) ([place])   . Diabetes mellitus   . 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[place]) 02/18/2020  . HTN (hypertension), malignant 09/06/2012  . Hypertension   . Narcotic abuse ([place])   . Normal cardiac stress test 2009  . TIA (transient ischemic attack) 09/06/2012  . Tobacco use disorder 12/10/2013  . UTI (urinary tract infection)   . Vertigo 09/06/2012    Patient Active Problem List   Diagnosis Date Noted  . Complicated UTI (urinary tract infection) 02/18/2020  . 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[place]) 02/18/2020  . Epigastric abdominal pain   . Epigastric pain 02/16/2020  . Hypertensive urgency 02/16/2020  . Cardiac murmur 02/16/2020  . Acute  pyelonephritis 02/16/2020  . Syncope 01/23/2017  . Elevated troponin   . Nausea 11/01/2016  . Dysuria 11/01/2016  . Chronic diastolic heart failure ([place]) 03/20/2015  . Chest pain 03/18/2015  . History of TIA (transient ischemic attack) 01/27/2015  . Enterococcus UTI 01/27/2015  . Cough 01/27/2015  . Bronchitis 01/27/2015  . HCAP (healthcare-associated pneumonia)   . CHF (congestive heart failure) ([place]) 01/26/2015  . History of alcohol abuse 01/26/2015  . Pyelonephritis 01/12/2015  . Prostatitis 01/12/2015  . Essential hypertension 11/03/2014  . Hypokalemia 11/03/2014  . Fever 08/15/2014  . Chronic pain 12/24/2013  . Tobacco use disorder 12/10/2013  . Leukocytosis 12/10/2013  . Diabetes mellitus type 2, controlled ([place]) 09/06/2012  . Pain in the side 09/06/2012  . Pituitary adenoma ([place]) 09/06/2012    Past Surgical History:  Procedure Laterality Date  . NO PAST SURGERIES         Family History  Problem Relation Age of Onset  . CAD Father        "heart attack in his 20's"  . Hypertension Father   . Diabetes Brother   . Diabetes Sister   . Hypertension Mother     Social History   Tobacco Use  . Smoking status: Current Every Day Smoker    Packs/day: 0.50    Types: Cigarettes  . Smokeless tobacco: Current User  Vaping Use  . Vaping Use: Never used  Substance Use Topics  . Alcohol use: Yes  . Drug use:  Yes    Types: Marijuana, Cocaine, IV    Comment: Heroin - one month ago    Home Medications Prior to Admission medications   Medication Sig Start Date End Date Taking? Authorizing Provider  albuterol (PROVENTIL HFA;VENTOLIN HFA) 108 (90 Base) MCG/ACT inhaler Inhale 1-2 puffs into the lungs every 6 (six) hours as needed for wheezing. 09/04/16   Tanna Furry, MD  amLODipine (NORVASC) 10 MG tablet Take 10 mg by mouth daily.    [provider]  aspirin EC 81 MG tablet Take 81 mg by mouth daily.    [provider]  cyclobenzaprine (FLEXERIL) 10 MG  tablet Take 1 tablet (10 mg total) by mouth 2 (two) times daily as needed for muscle spasms. 03/08/20   Ripley Fraise, MD  diltiazem (CARDIZEM CD) 240 MG 24 hr capsule Take 1 capsule (240 mg total) by mouth daily. 02/21/20   Eugenie Filler, MD  docusate sodium (COLACE) 100 MG capsule Take 1 capsule (100 mg total) by mouth every 12 (twelve) hours. Patient taking differently: Take 100 mg by mouth daily.  01/02/18   Little, Wenda Overland, MD  folic acid (FOLVITE) 1 MG tablet Take 1 tablet (1 mg total) by mouth daily. 02/21/20   Eugenie Filler, MD  gabapentin (NEURONTIN) 100 MG capsule Take 100 mg by mouth 3 (three) times daily. 06/10/16   [provider]  hydrALAZINE (APRESOLINE) 25 MG tablet Take 3 tablets (75 mg total) by mouth every 8 (eight) hours. 02/20/20   Eugenie Filler, MD  hydrochlorothiazide (MICROZIDE) 12.5 MG capsule Take 1 capsule (12.5 mg total) by mouth daily. 02/20/20   Eugenie Filler, MD  ibuprofen (ADVIL,MOTRIN) 800 MG tablet Take 0.5 tablets (400 mg total) by mouth every 6 (six) hours as needed (pain). 06/16/18   Fawze, Mina A, PA-C  metFORMIN (GLUCOPHAGE) 500 MG tablet Take 500 mg by mouth 2 (two) times daily with a meal.    [provider]  Multiple Vitamin (MULTIVITAMIN WITH MINERALS) TABS tablet Take 1 tablet by mouth daily. 02/21/20   Eugenie Filler, MD  nicotine (NICODERM CQ - DOSED IN MG/24 HOURS) 21 mg/24hr patch Place 1 patch (21 mg total) onto the skin daily. 02/21/20   Eugenie Filler, MD  pantoprazole (PROTONIX) 40 MG tablet Take 1 tablet (40 mg total) by mouth daily. 02/20/20   Eugenie Filler, MD  pravastatin (PRAVACHOL) 20 MG tablet Take 20 mg by mouth at bedtime.    [provider]  sucralfate (CARAFATE) 1 GM/10ML suspension Take 10 mLs (1 g total) by mouth 4 (four) times daily -  with meals and at bedtime. 02/20/20   Eugenie Filler, MD  thiamine 100 MG tablet Take 1 tablet (100 mg total) by mouth daily. 02/21/20    Eugenie Filler, MD  Vitamin D, Ergocalciferol, (DRISDOL) 1.25 MG (50000 UNIT) CAPS capsule Take 50,000 Units by mouth once a week. Mondays 02/14/20   [provider]    Allergies    Patient has no known allergies.  Review of Systems   Review of Systems  Constitutional: Negative for fever.  HENT: Negative for rhinorrhea and sore throat.   Eyes: Negative for redness.  Respiratory: Negative for cough.   Cardiovascular: Negative for chest pain.  Gastrointestinal: Positive for abdominal pain, nausea and vomiting. Negative for diarrhea.  Genitourinary: Positive for dysuria. Negative for flank pain, frequency, hematuria, penile pain, penile swelling, scrotal swelling and testicular pain.  Musculoskeletal: Negative for myalgias.  Skin: Negative  for rash.  Neurological: Negative for headaches.    Physical Exam Updated Vital Signs BP 138/82 (BP Location: Right Arm)   Pulse 64   Temp 98.5 F (36.9 C) (Oral)   Resp 18   SpO2 97%   Physical Exam Vitals and nursing note reviewed.  Constitutional:      Appearance: He is well-developed.  HENT:     Head: Normocephalic and atraumatic.  Eyes:     General:        Right eye: No discharge.        Left eye: No discharge.     Conjunctiva/sclera: Conjunctivae normal.  Cardiovascular:     Rate and Rhythm: Normal rate and regular rhythm.     Heart sounds: Normal heart sounds.  Pulmonary:     Effort: Pulmonary effort is normal.     Breath sounds: Normal breath sounds.  Abdominal:     Palpations: Abdomen is soft.     Tenderness: There is no abdominal tenderness. There is no right CVA tenderness, left CVA tenderness, guarding or rebound.  Musculoskeletal:     Cervical back: Normal range of motion and neck supple.  Skin:    General: Skin is warm and dry.  Neurological:     Mental Status: He is alert.     ED Results / Procedures / Treatments   Labs (all labs ordered are listed, but only abnormal results are displayed) Labs  Reviewed  URINALYSIS, ROUTINE W REFLEX MICROSCOPIC - Abnormal; Notable for the following components:      Result Value   APPearance CLOUDY (*)    Hgb urine dipstick LARGE (*)    Protein, ur 100 (*)    Leukocytes,Ua MODERATE (*)    RBC / HPF >50 (*)    WBC, UA >50 (*)    Bacteria, UA MANY (*)    All other components within normal limits  CBC - Abnormal; Notable for the following components:   WBC 13.4 (*)    RBC 6.75 (*)    MCV 75.0 (*)    MCH 22.4 (*)    MCHC 29.8 (*)    RDW 16.0 (*)    All other components within normal limits  COMPREHENSIVE METABOLIC PANEL - Abnormal; Notable for the following components:   CO2 21 (*)    Glucose, Bld 184 (*)    All other components within normal limits  URINE CULTURE    EKG None  Radiology No results found.  Procedures Procedures (including critical care time)  Medications Ordered in ED Medications  cefTRIAXone (ROCEPHIN) 1 g in sodium chloride 0.9 % 100 mL IVPB (0 g Intravenous Stopped 09/24/20 1347)  ondansetron (ZOFRAN) injection 4 mg (4 mg Intravenous Given 09/24/20 1317)  sodium chloride 0.9 % bolus 500 mL (0 mLs Intravenous Stopped 09/24/20 1431)    ED Course  I have reviewed the triage vital signs and the nursing notes.  Pertinent labs & imaging results that were available during my care of the patient were reviewed by me and considered in my medical decision making (see chart for details).  Patient seen and examined. Work-up reviewed.  Unfortunately, previous urine cultures have not borne out organisms most of the time.  During hospitalization earlier this year, patient was on several days of Rocephin and then transition to amoxicillin.  We will send culture, would like to check postvoid residual as well.  Given the patient's history, elevated white blood cell count, UA, and reported vomiting --I would like to give him a dose  of IV Rocephin, Zofran, and a small fluid bolus.  Will ensure that he is not persistently vomiting.  He  looks well, and may likely be discharged home.  Vital signs reviewed and are as follows: BP 138/82 (BP Location: Right Arm)   Pulse 64   Temp 98.5 F (36.9 C) (Oral)   Resp 18   SpO2 97%   3:12 PM patient rechecked.  Bladder scan 61 cc, however patient does have a very unusually shaped bladder demonstrated on previous CTs with a large right-sided diverticulum and a smaller one on the left.  This is likely contributing to the patient's recurrent urinary tract infections.  I strongly encouraged PCP and urology follow-up.  Patient states that he is feeling a lot better.  He is eating crackers at the bedside without nausea or vomiting.  He states that he is comfortable with discharge to home.  I will give prescription for Zofran and Keflex.  Encouraged urology follow-up.    MDM Rules/Calculators/A&P                           Patient with recurrent urinary tract infection today.  Mildly elevated white blood cell count.  No fevers reported.  Patient has had some vomiting but no flank pain.  No vomiting in ED and tolerating crackers without any difficulties.  No significant abdominal pain on exam.  Patient looks well, nontoxic.  Reviewed previous work-up and CT imaging.  Previous CTs have shown chronic bladder thickening with diverticula and likely bladder outlet obstruction.  Postvoid residual here today was normal.  Plan for discharge to home with strict return instructions as above.   Final Clinical Impression(s) / ED Diagnoses Final diagnoses:  Recurrent UTI  Diverticula, bladder    Rx / DC Orders ED Discharge Orders         Ordered    cephALEXin (KEFLEX) 500 MG capsule  2 times daily        09/24/20 1510    ondansetron (ZOFRAN ODT) 4 MG disintegrating tablet  Every 8 hours PRN        09/24/20 1510           Carlisle Cater, PA-C 09/24/20 1514    Pattricia Boss, MD 09/26/20 1701

## 2020-09-24 NOTE — ED Triage Notes (Signed)
Pt here with c/o burning upon urination along with frequency  He thinks maybe some blood also , denis discharge , hx of uti

## 2020-09-24 NOTE — Discharge Instructions (Signed)
Please read and follow all provided instructions.  Your diagnoses today include:  1. Recurrent UTI     Tests performed today include: Blood cell counts - were normal Electrolytes and kidney function - were normal  Urine culture - pending  Urine test - shows infection  Vital signs. See below for your results today.   Medications prescribed:   Keflex (cephalexin) - antibiotic  You have been prescribed an antibiotic medicine: take the entire course of medicine even if you are feeling better. Stopping early can cause the antibiotic not to work.   Zofran (ondansetron) - for nausea and vomiting  Take any prescribed medications only as directed.  Home care instructions:  Follow any educational materials contained in this packet.  BE VERY CAREFUL not to take multiple medicines containing Tylenol (also called acetaminophen). Doing so can lead to an overdose which can damage your liver and cause liver failure and possibly death.   Follow-up instructions: Please follow-up with your primary care provider in the next 3 days for further evaluation of your symptoms.   Return instructions:   Please return to the Emergency Department if you experience worsening symptoms.   Return if you develop persistent vomiting, fever, or have uncontrolled pain.  Please return if you have any other emergent concerns.  Additional Information:  Your vital signs today were: BP (!) 185/64    Pulse 63    Temp 98.5 F (36.9 C) (Oral)    Resp (!) 22    SpO2 96%  If your blood pressure (BP) was elevated above 135/85 this visit, please have this repeated by your doctor within one month. --------------

## 2020-09-26 LAB — URINE CULTURE: Culture: >=100000 — AB

## 2020-09-27 ENCOUNTER — Telehealth: Payer: Self-pay | Admitting: *Deleted

## 2020-09-27 NOTE — Progress Notes (Signed)
ED Antimicrobial Stewardship Positive Culture Follow Up   [name] is an 69 y.o. male who presented to [facility] on 09/24/2020 with a chief complaint of No chief complaint on file.   Recent Results (from the past 720 hour(s))  Urine Culture     Status: Abnormal   Collection Time: 09/24/20  3:00 PM   Specimen: Urine, Random  Result Value Ref Range Status   Specimen Description URINE, RANDOM  Final   Special Requests   Final    NONE Performed at [facility], [address], [place], [place] [address]    Culture >=100,000 COLONIES/mL ENTEROCOCCUS FAECALIS (A)  Final   Report Status 09/26/2020 FINAL  Final   Organism ID, Bacteria ENTEROCOCCUS FAECALIS (A)  Final      Susceptibility   Enterococcus faecalis - MIC*    AMPICILLIN <=2 SENSITIVE Sensitive     NITROFURANTOIN <=16 SENSITIVE Sensitive     VANCOMYCIN 1 SENSITIVE Sensitive     * >=100,000 COLONIES/mL ENTEROCOCCUS FAECALIS    [x]  Treated with cephalexin, organism resistant to prescribed antimicrobial []  Patient discharged originally without antimicrobial agent and treatment is now indicated  New antibiotic prescription: DC cephalexin, start amoxicillin 875mg  PO BID x 7 days  ED Provider: Arlean Hopping, PA-C   Cromwell, Rande Lawman 09/27/2020, 8:33 AM Clinical Pharmacist Monday - Friday phone -  [phone] Saturday - Sunday phone - [phone]

## 2020-09-27 NOTE — Telephone Encounter (Signed)
Post ED Visit - Positive Culture Follow-up: Successful Patient Follow-Up  Culture assessed and recommendations reviewed by:  []  Elenor Quinones, Pharm.D. []  Heide Guile, Pharm.D., BCPS AQ-ID []  Parks Neptune, Pharm.D., BCPS []  Alycia Rossetti, Pharm.D., BCPS []  [place], Pharm.D., BCPS, AAHIVP []  Legrand Como, Pharm.D., BCPS, AAHIVP []  Salome Arnt, PharmD, BCPS []  Johnnette Gourd, PharmD, BCPS []  Hughes Better, PharmD, BCPS []  Leeroy Cha, PharmD  Positive urine culture  []  Patient discharged without antimicrobial prescription and treatment is now indicated [x]  Organism is resistant to prescribed ED discharge antimicrobial []  Patient with positive blood cultures  Changes discussed with ED provider Arlean Hopping, PA-C New antibiotic prescription Amoxicillin 875mg  po bid x 7 days Called to [place]  Contacted patient, date 09/27/2020, time [place], [place] 09/27/2020, 10:14 AM

## 2020-10-04 ENCOUNTER — Other Ambulatory Visit (HOSPITAL_COMMUNITY): Payer: Self-pay | Admitting: Internal Medicine

## 2020-12-13 ENCOUNTER — Emergency Department (HOSPITAL_COMMUNITY)
Admission: EM | Admit: 2020-12-13 | Discharge: 2020-12-13 | Disposition: A | Discharge status: Home / Self Care | Payer: Medicare HMO | Attending: Emergency Medicine | Admitting: Emergency Medicine

## 2020-12-13 ENCOUNTER — Encounter (HOSPITAL_COMMUNITY): Payer: Self-pay

## 2020-12-13 ENCOUNTER — Emergency Department (HOSPITAL_COMMUNITY): Payer: Medicare HMO

## 2020-12-13 ENCOUNTER — Other Ambulatory Visit (HOSPITAL_COMMUNITY): Payer: Self-pay | Admitting: Emergency Medicine

## 2020-12-13 DIAGNOSIS — Z7984 Long term (current) use of oral hypoglycemic drugs: Secondary | ICD-10-CM | POA: Diagnosis not present

## 2020-12-13 DIAGNOSIS — F17210 Nicotine dependence, cigarettes, uncomplicated: Secondary | ICD-10-CM | POA: Diagnosis not present

## 2020-12-13 DIAGNOSIS — N323 Diverticulum of bladder: Secondary | ICD-10-CM | POA: Insufficient documentation

## 2020-12-13 DIAGNOSIS — Z79899 Other long term (current) drug therapy: Secondary | ICD-10-CM | POA: Insufficient documentation

## 2020-12-13 DIAGNOSIS — Z20822 Contact with and (suspected) exposure to covid-19: Secondary | ICD-10-CM | POA: Insufficient documentation

## 2020-12-13 DIAGNOSIS — R350 Frequency of micturition: Secondary | ICD-10-CM | POA: Diagnosis present

## 2020-12-13 DIAGNOSIS — Z7982 Long term (current) use of aspirin: Secondary | ICD-10-CM | POA: Diagnosis not present

## 2020-12-13 DIAGNOSIS — E119 Type 2 diabetes mellitus without complications: Secondary | ICD-10-CM | POA: Diagnosis not present

## 2020-12-13 DIAGNOSIS — I5032 Chronic diastolic (congestive) heart failure: Secondary | ICD-10-CM | POA: Diagnosis not present

## 2020-12-13 DIAGNOSIS — I110 Hypertensive heart disease with heart failure: Secondary | ICD-10-CM | POA: Diagnosis not present

## 2020-12-13 DIAGNOSIS — N3001 Acute cystitis with hematuria: Secondary | ICD-10-CM | POA: Diagnosis not present

## 2020-12-13 LAB — CBC WITH DIFFERENTIAL/PLATELET
Abs Immature Granulocytes: 0.05 10*3/uL (ref 0.00–0.07)
Basophils Absolute: 0 10*3/uL (ref 0.0–0.1)
Basophils Relative: 0 %
Eosinophils Absolute: 0 10*3/uL (ref 0.0–0.5)
Eosinophils Relative: 0 %
HCT: 50 % (ref 39.0–52.0)
Hemoglobin: 15.8 g/dL (ref 13.0–17.0)
Immature Granulocytes: 0 %
Lymphocytes Relative: 15 %
Lymphs Abs: 1.9 10*3/uL (ref 0.7–4.0)
MCH: 23.3 pg — ABNORMAL LOW (ref 26.0–34.0)
MCHC: 31.6 g/dL (ref 30.0–36.0)
MCV: 73.7 fL — ABNORMAL LOW (ref 80.0–100.0)
Monocytes Absolute: 0.4 10*3/uL (ref 0.1–1.0)
Monocytes Relative: 3 %
Neutro Abs: 9.8 10*3/uL — ABNORMAL HIGH (ref 1.7–7.7)
Neutrophils Relative %: 82 %
Platelets: 352 10*3/uL (ref 150–400)
RBC: 6.78 MIL/uL — ABNORMAL HIGH (ref 4.22–5.81)
RDW: 15.1 % (ref 11.5–15.5)
WBC: 12.2 10*3/uL — ABNORMAL HIGH (ref 4.0–10.5)
nRBC: 0 % (ref 0.0–0.2)

## 2020-12-13 LAB — URINALYSIS, ROUTINE W REFLEX MICROSCOPIC
Bilirubin Urine: NEGATIVE
Glucose, UA: NEGATIVE mg/dL
Ketones, ur: 5 mg/dL — AB
Nitrite: POSITIVE — AB
Protein, ur: 100 mg/dL — AB
RBC / HPF: >50 RBC/hpf — ABNORMAL HIGH (ref 0–5)
Specific Gravity, Urine: 1.015 (ref 1.005–1.030)
WBC, UA: >50 WBC/hpf — ABNORMAL HIGH (ref 0–5)
pH: 8 (ref 5.0–8.0)

## 2020-12-13 LAB — COMPREHENSIVE METABOLIC PANEL
ALT: 35 U/L (ref 0–44)
AST: 39 U/L (ref 15–41)
Albumin: 4.4 g/dL (ref 3.5–5.0)
Alkaline Phosphatase: 68 U/L (ref 38–126)
Anion gap: 16 — ABNORMAL HIGH (ref 5–15)
BUN: 33 mg/dL — ABNORMAL HIGH (ref 8–23)
CO2: 22 mmol/L (ref 22–32)
Calcium: 10.3 mg/dL (ref 8.9–10.3)
Chloride: 103 mmol/L (ref 98–111)
Creatinine, Ser: 1.22 mg/dL (ref 0.61–1.24)
GFR, Estimated: >60 mL/min (ref >60)
Glucose, Bld: 147 mg/dL — ABNORMAL HIGH (ref 70–99)
Potassium: 4.4 mmol/L (ref 3.5–5.1)
Sodium: 141 mmol/L (ref 135–145)
Total Bilirubin: 1.6 mg/dL — ABNORMAL HIGH (ref 0.3–1.2)
Total Protein: 9 g/dL — ABNORMAL HIGH (ref 6.5–8.1)

## 2020-12-13 LAB — RESP PANEL BY RT-PCR (FLU A&B, COVID) ARPGX2
Influenza A by PCR: NEGATIVE
Influenza B by PCR: NEGATIVE
SARS Coronavirus 2 by RT PCR: NEGATIVE

## 2020-12-13 LAB — LIPASE, BLOOD: Lipase: 28 U/L (ref 11–51)

## 2020-12-13 MED ORDER — PHENAZOPYRIDINE HCL 200 MG PO TABS: 200.0000 mg | ORAL_TABLET | Freq: Three times a day (TID) | ORAL | 0 refills | Status: DC

## 2020-12-13 MED ORDER — MORPHINE SULFATE (PF) 4 MG/ML IV SOLN
4.0000 mg | Freq: Once | INTRAVENOUS | Status: AC
  Administered 2020-12-13: 11:00:00 4 mg via INTRAVENOUS
  Filled 2020-12-13: qty 1

## 2020-12-13 MED ORDER — NITROFURANTOIN MONOHYD MACRO 100 MG PO CAPS
100.0000 mg | ORAL_CAPSULE | Freq: Once | ORAL | Status: AC
  Administered 2020-12-13: 16:00:00 100 mg via ORAL
  Filled 2020-12-13: qty 1

## 2020-12-13 MED ORDER — ONDANSETRON 4 MG PO TBDP: 4.0000 mg | ORAL_TABLET | Freq: Three times a day (TID) | ORAL | 0 refills | Status: DC | PRN

## 2020-12-13 MED ORDER — MORPHINE SULFATE (PF) 4 MG/ML IV SOLN
4.0000 mg | Freq: Once | INTRAVENOUS | Status: AC
  Administered 2020-12-13: 15:00:00 4 mg via INTRAVENOUS
  Filled 2020-12-13: qty 1

## 2020-12-13 MED ORDER — ONDANSETRON HCL 4 MG/2ML IJ SOLN
4.0000 mg | Freq: Once | INTRAMUSCULAR | Status: AC
  Administered 2020-12-13: 11:00:00 4 mg via INTRAVENOUS
  Filled 2020-12-13: qty 2

## 2020-12-13 MED ORDER — SODIUM CHLORIDE 0.9 % IV BOLUS
1000.0000 mL | Freq: Once | INTRAVENOUS | Status: AC
  Administered 2020-12-13: 11:00:00 1000 mL via INTRAVENOUS

## 2020-12-13 MED ORDER — NITROFURANTOIN MONOHYD MACRO 100 MG PO CAPS: 100.0000 mg | ORAL_CAPSULE | Freq: Two times a day (BID) | ORAL | 0 refills | Status: DC

## 2020-12-13 MED ORDER — HYDROCODONE-ACETAMINOPHEN 5-325 MG PO TABS: 1.0000 | ORAL_TABLET | ORAL | 0 refills | Status: DC | PRN

## 2020-12-13 NOTE — ED Notes (Signed)
Pt is difficult stick. Refusing IV placement everywhere except for right hand. I have stuck twice. Another RN has attempted once. No IV access secured. Charge RN made aware

## 2020-12-13 NOTE — ED Provider Notes (Signed)
[facility] DEPT Provider Note   CSN: [id number] Arrival date & time: 12/13/20  0174     History Chief Complaint  Patient presents with  . Abdominal Pain  . Urinary Frequency    [name] is a 69 y.o. male.  Pt presents to the ED today with a possible UTI.  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  He's had dysuria and urinary frequency for 3 days.  He feels like he is emptying his bladder.  He denies any fevers, but he's had n/v.  The pt has also had a cough.  He's been vaccinated against Covid.        Past Medical History:  Diagnosis Date  . Alcohol dependence ([place]) 12/10/2013  . Angioedema of lips 12/24/2013  . Brain tumor ([place])   . CHF (congestive heart failure) (HCC)    diastolic  . Chronic back pain   . Chronic headache   . CVA (cerebral vascular accident) ([place])   . Diabetes mellitus   . 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[place]) 02/18/2020  . HTN (hypertension), malignant 09/06/2012  . Hypertension   . Narcotic abuse ([place])   . Normal cardiac stress test 2009  . TIA (transient ischemic attack) 09/06/2012  . Tobacco use disorder 12/10/2013  . UTI (urinary tract infection)   . Vertigo 09/06/2012    Patient Active Problem List   Diagnosis Date Noted  . Complicated UTI (urinary tract infection) 02/18/2020  . 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[place]) 02/18/2020  . Epigastric abdominal pain   . Epigastric pain 02/16/2020  . Hypertensive urgency 02/16/2020  . Cardiac murmur 02/16/2020  . Acute pyelonephritis 02/16/2020  . Syncope 01/23/2017  . Elevated troponin   . Nausea 11/01/2016  . Dysuria 11/01/2016  . Chronic diastolic heart failure ([place]) 03/20/2015  . Chest pain 03/18/2015  . History of TIA (transient ischemic attack) 01/27/2015  . Enterococcus UTI 01/27/2015  . Cough 01/27/2015  . Bronchitis 01/27/2015  . HCAP (healthcare-associated pneumonia)   . CHF (congestive heart failure)  ([place]) 01/26/2015  . History of alcohol abuse 01/26/2015  . Pyelonephritis 01/12/2015  . Prostatitis 01/12/2015  . Essential hypertension 11/03/2014  . Hypokalemia 11/03/2014  . Fever 08/15/2014  . Chronic pain 12/24/2013  . Tobacco use disorder 12/10/2013  . Leukocytosis 12/10/2013  . Diabetes mellitus type 2, controlled ([place]) 09/06/2012  . Pain in the side 09/06/2012  . Pituitary adenoma ([place]) 09/06/2012    Past Surgical History:  Procedure Laterality Date  . NO PAST SURGERIES         Family History  Problem Relation Age of Onset  . CAD Father        "heart attack in his 54's"  . Hypertension Father   . Diabetes Brother   . Diabetes Sister   . Hypertension Mother     Social History   Tobacco Use  . Smoking status: Current Every Day Smoker    Packs/day: 0.50    Types: Cigarettes  . Smokeless tobacco: Current User  Vaping Use  . Vaping Use: Never used  Substance Use Topics  . Alcohol use: Yes  . Drug use: Yes    Types: Marijuana, Cocaine, IV    Comment: Heroin - one month ago    Home Medications Prior to Admission medications   Medication Sig Start Date End Date Taking? Authorizing Provider  albuterol (PROVENTIL HFA;VENTOLIN HFA) 108 (90 Base) MCG/ACT inhaler Inhale 1-2 puffs into the lungs every 6 (six) hours as  needed for wheezing. 09/04/16  Yes Tanna Furry, MD  amLODipine (NORVASC) 10 MG tablet Take 10 mg by mouth daily.   Yes [provider]  aspirin EC 81 MG tablet Take 81 mg by mouth daily.   Yes [provider]  diltiazem (CARDIZEM CD) 240 MG 24 hr capsule Take 1 capsule (240 mg total) by mouth daily. 02/21/20  Yes Eugenie Filler, MD  docusate sodium (COLACE) 100 MG capsule Take 1 capsule (100 mg total) by mouth every 12 (twelve) hours. Patient taking differently: Take 100 mg by mouth daily. 01/02/18  Yes Little, Wenda Overland, MD  folic acid (FOLVITE) 1 MG tablet Take 1 tablet (1 mg total) by mouth daily. 02/21/20  Yes Eugenie Filler, MD  gabapentin (NEURONTIN) 100 MG capsule Take 100 mg by mouth 2 (two) times daily. 06/10/16  Yes [provider]  hydrALAZINE (APRESOLINE) 100 MG tablet Take 100 mg by mouth 3 (three) times daily. 07/08/20  Yes [provider]  ibuprofen (ADVIL,MOTRIN) 800 MG tablet Take 0.5 tablets (400 mg total) by mouth every 6 (six) hours as needed (pain). 06/16/18  Yes Fawze, Mina A, PA-C  metFORMIN (GLUCOPHAGE) 500 MG tablet Take 500 mg by mouth 2 (two) times daily with a meal.   Yes [provider]  nicotine (NICODERM CQ - DOSED IN MG/24 HOURS) 21 mg/24hr patch Place 1 patch (21 mg total) onto the skin daily. 02/21/20  Yes Eugenie Filler, MD  pravastatin (PRAVACHOL) 20 MG tablet Take 20 mg by mouth at bedtime.   Yes [provider]  sucralfate (CARAFATE) 1 GM/10ML suspension Take 10 mLs (1 g total) by mouth 4 (four) times daily -  with meals and at bedtime. 02/20/20  Yes Eugenie Filler, MD  thiamine 100 MG tablet Take 1 tablet (100 mg total) by mouth daily. 02/21/20  Yes Eugenie Filler, MD  traMADol (ULTRAM) 50 MG tablet Take 50 mg by mouth 2 (two) times daily as needed for severe pain. 10/05/20  Yes [provider]  Vitamin D, Ergocalciferol, (DRISDOL) 1.25 MG (50000 UNIT) CAPS capsule Take 50,000 Units by mouth once a week. 02/14/20  Yes [provider]  cephALEXin (KEFLEX) 500 MG capsule Take 1 capsule (500 mg total) by mouth 2 (two) times daily. Patient not taking: No sig reported 09/24/20   Carlisle Cater, PA-C  cyclobenzaprine (FLEXERIL) 10 MG tablet Take 1 tablet (10 mg total) by mouth 2 (two) times daily as needed for muscle spasms. Patient not taking: No sig reported 03/08/20   Ripley Fraise, MD  hydrALAZINE (APRESOLINE) 25 MG tablet Take 3 tablets (75 mg total) by mouth every 8 (eight) hours. Patient not taking: Reported on 12/13/2020 02/20/20   Eugenie Filler, MD  hydrochlorothiazide (MICROZIDE) 12.5 MG capsule Take 1 capsule (12.5  mg total) by mouth daily. Patient not taking: No sig reported 02/20/20   Eugenie Filler, MD  HYDROcodone-acetaminophen (NORCO/VICODIN) 5-325 MG tablet Take 1 tablet by mouth every 4 (four) hours as needed. 12/13/20   Isla Pence, MD  Multiple Vitamin (MULTIVITAMIN WITH MINERALS) TABS tablet Take 1 tablet by mouth daily. Patient not taking: No sig reported 02/21/20   Eugenie Filler, MD  nitrofurantoin, macrocrystal-monohydrate, (MACROBID) 100 MG capsule Take 1 capsule (100 mg total) by mouth 2 (two) times daily. 12/13/20   Isla Pence, MD  ondansetron (ZOFRAN ODT) 4 MG disintegrating tablet Take 1 tablet (4 mg total) by mouth every 8 (eight) hours as needed for nausea or  vomiting. 12/13/20   Isla Pence, MD  pantoprazole (PROTONIX) 40 MG tablet Take 1 tablet (40 mg total) by mouth daily. Patient not taking: No sig reported 02/20/20   Eugenie Filler, MD  phenazopyridine (PYRIDIUM) 200 MG tablet Take 1 tablet (200 mg total) by mouth 3 (three) times daily. 12/13/20   Isla Pence, MD    Allergies    Patient has no known allergies.  Review of Systems   Review of Systems  Respiratory: Positive for cough.   Gastrointestinal: Positive for abdominal pain.  Genitourinary: Positive for dysuria and frequency.  All other systems reviewed and are negative.   Physical Exam Updated Vital Signs BP (!) 214/66   Pulse 65   Temp 99 F (37.2 C) (Oral)   Resp 18   SpO2 97%   Physical Exam Vitals and nursing note reviewed.  HENT:     Head: Normocephalic and atraumatic.     Mouth/Throat:     Mouth: Mucous membranes are moist.     Pharynx: Oropharynx is clear.  Eyes:     Extraocular Movements: Extraocular movements intact.     Pupils: Pupils are equal, round, and reactive to light.  Cardiovascular:     Rate and Rhythm: Normal rate and regular rhythm.  Abdominal:     General: Abdomen is flat.     Palpations: Abdomen is soft.     Tenderness: There is abdominal tenderness  in the suprapubic area.  Skin:    General: Skin is warm.     Capillary Refill: Capillary refill takes less than 2 seconds.  Neurological:     General: No focal deficit present.     Mental Status: He is alert and oriented to person, place, and time.  Psychiatric:        Mood and Affect: Mood normal.        Behavior: Behavior normal.     ED Results / Procedures / Treatments   Labs (all labs ordered are listed, but only abnormal results are displayed) Labs Reviewed  CBC WITH DIFFERENTIAL/PLATELET - Abnormal; Notable for the following components:      Result Value   WBC 12.2 (*)    RBC 6.78 (*)    MCV 73.7 (*)    MCH 23.3 (*)    Neutro Abs 9.8 (*)    All other components within normal limits  COMPREHENSIVE METABOLIC PANEL - Abnormal; Notable for the following components:   Glucose, Bld 147 (*)    BUN 33 (*)    Total Protein 9.0 (*)    Total Bilirubin 1.6 (*)    Anion gap 16 (*)    All other components within normal limits  URINALYSIS, ROUTINE W REFLEX MICROSCOPIC - Abnormal; Notable for the following components:   Color, Urine AMBER (*)    APPearance CLOUDY (*)    Hgb urine dipstick SMALL (*)    Ketones, ur 5 (*)    Protein, ur 100 (*)    Nitrite POSITIVE (*)    Leukocytes,Ua MODERATE (*)    RBC / HPF >50 (*)    WBC, UA >50 (*)    Bacteria, UA MANY (*)    Non Squamous Epithelial 6-10 (*)    All other components within normal limits  RESP PANEL BY RT-PCR (FLU A&B, COVID) ARPGX2  URINE CULTURE  LIPASE, BLOOD    EKG None  Radiology DG Chest Portable 1 View  Result Date: 12/13/2020 CLINICAL DATA:  Cough EXAM: PORTABLE CHEST 1 VIEW COMPARISON:  08/01/2020 and prior.  FINDINGS: Minimal left basilar opacities. No pneumothorax or pleural effusion. Cardiomediastinal silhouette is within normal limits. No acute osseous abnormality. IMPRESSION: Minimal left basilar opacities, likely atelectasis. Electronically Signed   By: Primitivo Gauze M.D.   On: 12/13/2020 09:51     Procedures Procedures (including critical care time)  Medications Ordered in ED Medications  nitrofurantoin (macrocrystal-monohydrate) (MACROBID) capsule 100 mg (has no administration in time range)  sodium chloride 0.9 % bolus 1,000 mL (1,000 mLs Intravenous New Bag/Given 12/13/20 1059)  ondansetron (ZOFRAN) injection 4 mg (4 mg Intravenous Given 12/13/20 1059)  morphine 4 MG/ML injection 4 mg (4 mg Intravenous Given 12/13/20 1059)  morphine 4 MG/ML injection 4 mg (4 mg Intravenous Given 12/13/20 1519)    ED Course  I have reviewed the triage vital signs and the nursing notes.  Pertinent labs & imaging results that were available during my care of the patient were reviewed by me and considered in my medical decision making (see chart for details).    MDM Rules/Calculators/A&P                          Pt is feeling much better after fluids and meds.  He does not seem to be septic.  His urine is positive for infection.  His last urine culture in sept grew out enterococcus which was sensitive to macrobid.  I will start that again.  Pt is instructed to f/u with urology.  Return if worse.  Final Clinical Impression(s) / ED Diagnoses Final diagnoses:  Acute cystitis with hematuria  Bladder diverticulum    Rx / DC Orders ED Discharge Orders         Ordered    nitrofurantoin, macrocrystal-monohydrate, (MACROBID) 100 MG capsule  2 times daily        12/13/20 1529    phenazopyridine (PYRIDIUM) 200 MG tablet  3 times daily        12/13/20 1529    ondansetron (ZOFRAN ODT) 4 MG disintegrating tablet  Every 8 hours PRN        12/13/20 1529    HYDROcodone-acetaminophen (NORCO/VICODIN) 5-325 MG tablet  Every 4 hours PRN        12/13/20 1529           Isla Pence, MD 12/13/20 1532

## 2020-12-13 NOTE — ED Triage Notes (Signed)
Pt arrived via walk in, c/o diffuse abd pain, n/v and urinary frequency and dysuria x3 days. Denies any blood in urine, denies any diarrhea.

## 2020-12-14 LAB — URINE CULTURE

## 2021-01-26 ENCOUNTER — Emergency Department (HOSPITAL_COMMUNITY)
Admission: EM | Admit: 2021-01-26 | Discharge: 2021-01-26 | Disposition: A | Discharge status: Home / Self Care | Payer: Medicare HMO | Attending: Emergency Medicine | Admitting: Emergency Medicine

## 2021-01-26 ENCOUNTER — Encounter (HOSPITAL_COMMUNITY): Payer: Self-pay | Admitting: Emergency Medicine

## 2021-01-26 ENCOUNTER — Other Ambulatory Visit: Payer: Self-pay

## 2021-01-26 DIAGNOSIS — Z79899 Other long term (current) drug therapy: Secondary | ICD-10-CM | POA: Insufficient documentation

## 2021-01-26 DIAGNOSIS — Z7982 Long term (current) use of aspirin: Secondary | ICD-10-CM | POA: Insufficient documentation

## 2021-01-26 DIAGNOSIS — Z8673 Personal history of transient ischemic attack (TIA), and cerebral infarction without residual deficits: Secondary | ICD-10-CM | POA: Diagnosis not present

## 2021-01-26 DIAGNOSIS — F17210 Nicotine dependence, cigarettes, uncomplicated: Secondary | ICD-10-CM | POA: Diagnosis not present

## 2021-01-26 DIAGNOSIS — E119 Type 2 diabetes mellitus without complications: Secondary | ICD-10-CM | POA: Diagnosis not present

## 2021-01-26 DIAGNOSIS — I110 Hypertensive heart disease with heart failure: Secondary | ICD-10-CM | POA: Diagnosis not present

## 2021-01-26 DIAGNOSIS — N3001 Acute cystitis with hematuria: Secondary | ICD-10-CM | POA: Diagnosis not present

## 2021-01-26 DIAGNOSIS — R1030 Lower abdominal pain, unspecified: Secondary | ICD-10-CM | POA: Diagnosis present

## 2021-01-26 DIAGNOSIS — I5032 Chronic diastolic (congestive) heart failure: Secondary | ICD-10-CM | POA: Insufficient documentation

## 2021-01-26 DIAGNOSIS — Z7984 Long term (current) use of oral hypoglycemic drugs: Secondary | ICD-10-CM | POA: Diagnosis not present

## 2021-01-26 LAB — COMPREHENSIVE METABOLIC PANEL
ALT: 32 U/L (ref 0–44)
AST: 43 U/L — ABNORMAL HIGH (ref 15–41)
Albumin: 4.1 g/dL (ref 3.5–5.0)
Alkaline Phosphatase: 51 U/L (ref 38–126)
Anion gap: 13 (ref 5–15)
BUN: 33 mg/dL — ABNORMAL HIGH (ref 8–23)
CO2: 20 mmol/L — ABNORMAL LOW (ref 22–32)
Calcium: 9.6 mg/dL (ref 8.9–10.3)
Chloride: 105 mmol/L (ref 98–111)
Creatinine, Ser: 1.6 mg/dL — ABNORMAL HIGH (ref 0.61–1.24)
GFR, Estimated: 47 mL/min — ABNORMAL LOW (ref >60)
Glucose, Bld: 158 mg/dL — ABNORMAL HIGH (ref 70–99)
Potassium: 3.8 mmol/L (ref 3.5–5.1)
Sodium: 138 mmol/L (ref 135–145)
Total Bilirubin: 0.9 mg/dL (ref 0.3–1.2)
Total Protein: 8.3 g/dL — ABNORMAL HIGH (ref 6.5–8.1)

## 2021-01-26 LAB — CBC WITH DIFFERENTIAL/PLATELET
Abs Immature Granulocytes: 0.02 10*3/uL (ref 0.00–0.07)
Basophils Absolute: 0 10*3/uL (ref 0.0–0.1)
Basophils Relative: 0 %
Eosinophils Absolute: 0 10*3/uL (ref 0.0–0.5)
Eosinophils Relative: 0 %
HCT: 48 % (ref 39.0–52.0)
Hemoglobin: 15.2 g/dL (ref 13.0–17.0)
Immature Granulocytes: 0 %
Lymphocytes Relative: 16 %
Lymphs Abs: 0.9 10*3/uL (ref 0.7–4.0)
MCH: 23.1 pg — ABNORMAL LOW (ref 26.0–34.0)
MCHC: 31.7 g/dL (ref 30.0–36.0)
MCV: 72.8 fL — ABNORMAL LOW (ref 80.0–100.0)
Monocytes Absolute: 0.5 10*3/uL (ref 0.1–1.0)
Monocytes Relative: 8 %
Neutro Abs: 4.2 10*3/uL (ref 1.7–7.7)
Neutrophils Relative %: 76 %
Platelets: 250 10*3/uL (ref 150–400)
RBC: 6.59 MIL/uL — ABNORMAL HIGH (ref 4.22–5.81)
RDW: 15.1 % (ref 11.5–15.5)
WBC: 5.6 10*3/uL (ref 4.0–10.5)
nRBC: 0 % (ref 0.0–0.2)

## 2021-01-26 LAB — URINALYSIS, ROUTINE W REFLEX MICROSCOPIC
Bilirubin Urine: NEGATIVE
Glucose, UA: NEGATIVE mg/dL
Ketones, ur: 5 mg/dL — AB
Nitrite: NEGATIVE
Protein, ur: >=300 mg/dL — AB
RBC / HPF: >50 RBC/hpf — ABNORMAL HIGH (ref 0–5)
Specific Gravity, Urine: 1.013 (ref 1.005–1.030)
WBC, UA: >50 WBC/hpf — ABNORMAL HIGH (ref 0–5)
pH: 9 — ABNORMAL HIGH (ref 5.0–8.0)

## 2021-01-26 MED ORDER — AMOXICILLIN 500 MG PO CAPS: 500.0000 mg | ORAL_CAPSULE | Freq: Three times a day (TID) | ORAL | 0 refills | Status: AC

## 2021-01-26 MED ORDER — HYDRALAZINE HCL 25 MG PO TABS
25.0000 mg | ORAL_TABLET | Freq: Once | ORAL | Status: AC
  Administered 2021-01-26: 25 mg via ORAL
  Filled 2021-01-26: qty 1

## 2021-01-26 MED ORDER — ACETAMINOPHEN 325 MG PO TABS
650.0000 mg | ORAL_TABLET | Freq: Once | ORAL | Status: AC
  Administered 2021-01-26: 650 mg via ORAL
  Filled 2021-01-26: qty 2

## 2021-01-26 MED ORDER — SODIUM CHLORIDE 0.9 % IV SOLN
1.0000 g | Freq: Once | INTRAVENOUS | Status: AC
  Administered 2021-01-26: 1 g via INTRAVENOUS
  Filled 2021-01-26: qty 10

## 2021-01-26 NOTE — ED Triage Notes (Signed)
Pt states he has a hx of UTI and feels the same today as previous times. Lower abdominal pain, N/V, dysuria. Alert and oriented

## 2021-01-26 NOTE — Discharge Instructions (Addendum)
You have a repeat urine infection.  Your blood tests were normal.  Call your urologist as discussed in the next 2-3 days.   Return immediately back to the ER if:  Your symptoms worsen within the next 12-24 hours. You develop new symptoms such as new fevers, persistent vomiting, new pain, shortness of breath, or new weakness or numbness, or if you have any other concerns.

## 2021-01-26 NOTE — ED Provider Notes (Signed)
[facility] DEPT Provider Note   CSN: [id number] Arrival date & time: 01/26/21  1509     History Chief Complaint  Patient presents with  . Dysuria    [name] is a 70 y.o. male.  Patient presents with abdominal pain suprapubic region.  He states he gets like this whenever he has a urinary tract infection.  Patient has a history of having multiple urinary tract infections in the past.  He sees a urologist for his frequent infections he states.  He denies fevers or chills.  No vomiting or diarrhea.  Denies chest pain.  Describes the abdominal pain is cramping in nature and lower abdominal region.        Past Medical History:  Diagnosis Date  . Alcohol dependence ([place]) 12/10/2013  . Angioedema of lips 12/24/2013  . Brain tumor ([place])   . CHF (congestive heart failure) (HCC)    diastolic  . Chronic back pain   . Chronic headache   . CVA (cerebral vascular accident) ([place])   . Diabetes mellitus   . 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[place]) 02/18/2020  . HTN (hypertension), malignant 09/06/2012  . Hypertension   . Narcotic abuse ([place])   . Normal cardiac stress test 2009  . TIA (transient ischemic attack) 09/06/2012  . Tobacco use disorder 12/10/2013  . UTI (urinary tract infection)   . Vertigo 09/06/2012    Patient Active Problem List   Diagnosis Date Noted  . Complicated UTI (urinary tract infection) 02/18/2020  . 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[place]) 02/18/2020  . Epigastric abdominal pain   . Epigastric pain 02/16/2020  . Hypertensive urgency 02/16/2020  . Cardiac murmur 02/16/2020  . Acute pyelonephritis 02/16/2020  . Syncope 01/23/2017  . Elevated troponin   . Nausea 11/01/2016  . Dysuria 11/01/2016  . Chronic diastolic heart failure ([place]) 03/20/2015  . Chest pain 03/18/2015  . History of TIA (transient ischemic attack) 01/27/2015  . Enterococcus UTI 01/27/2015  . Cough 01/27/2015  . Bronchitis  01/27/2015  . HCAP (healthcare-associated pneumonia)   . CHF (congestive heart failure) ([place]) 01/26/2015  . History of alcohol abuse 01/26/2015  . Pyelonephritis 01/12/2015  . Prostatitis 01/12/2015  . Essential hypertension 11/03/2014  . Hypokalemia 11/03/2014  . Fever 08/15/2014  . Chronic pain 12/24/2013  . Tobacco use disorder 12/10/2013  . Leukocytosis 12/10/2013  . Diabetes mellitus type 2, controlled ([place]) 09/06/2012  . Pain in the side 09/06/2012  . Pituitary adenoma ([place]) 09/06/2012    Past Surgical History:  Procedure Laterality Date  . NO PAST SURGERIES         Family History  Problem Relation Age of Onset  . CAD Father        "heart attack in his 42's"  . Hypertension Father   . Diabetes Brother   . Diabetes Sister   . Hypertension Mother     Social History   Tobacco Use  . Smoking status: Current Every Day Smoker    Packs/day: 0.50    Types: Cigarettes  . Smokeless tobacco: Current User  Vaping Use  . Vaping Use: Never used  Substance Use Topics  . Alcohol use: Yes  . Drug use: Yes    Types: Marijuana, Cocaine, IV    Comment: Heroin - one month ago    Home Medications Prior to Admission medications   Medication Sig Start Date End Date Taking? Authorizing Provider  amoxicillin (AMOXIL) 500 MG capsule Take 1 capsule (500 mg total) by mouth 3 (three)  times daily for 7 days. 01/26/21 02/02/21 Yes Luna Fuse, MD  albuterol (PROVENTIL HFA;VENTOLIN HFA) 108 (90 Base) MCG/ACT inhaler Inhale 1-2 puffs into the lungs every 6 (six) hours as needed for wheezing. 09/04/16   Tanna Furry, MD  amLODipine (NORVASC) 10 MG tablet Take 10 mg by mouth daily.    [provider]  aspirin EC 81 MG tablet Take 81 mg by mouth daily.    [provider]  cyclobenzaprine (FLEXERIL) 10 MG tablet Take 1 tablet (10 mg total) by mouth 2 (two) times daily as needed for muscle spasms. Patient not taking: No sig reported 03/08/20   Ripley Fraise, MD  diltiazem  (CARDIZEM CD) 240 MG 24 hr capsule Take 1 capsule (240 mg total) by mouth daily. 02/21/20   Eugenie Filler, MD  docusate sodium (COLACE) 100 MG capsule Take 1 capsule (100 mg total) by mouth every 12 (twelve) hours. Patient taking differently: Take 100 mg by mouth daily. 01/02/18   Little, Wenda Overland, MD  folic acid (FOLVITE) 1 MG tablet Take 1 tablet (1 mg total) by mouth daily. 02/21/20   Eugenie Filler, MD  gabapentin (NEURONTIN) 100 MG capsule Take 100 mg by mouth 2 (two) times daily. 06/10/16   [provider]  hydrALAZINE (APRESOLINE) 100 MG tablet Take 100 mg by mouth 3 (three) times daily. 07/08/20   [provider]  hydrALAZINE (APRESOLINE) 25 MG tablet Take 3 tablets (75 mg total) by mouth every 8 (eight) hours. Patient not taking: Reported on 12/13/2020 02/20/20   Eugenie Filler, MD  hydrochlorothiazide (MICROZIDE) 12.5 MG capsule Take 1 capsule (12.5 mg total) by mouth daily. Patient not taking: No sig reported 02/20/20   Eugenie Filler, MD  HYDROcodone-acetaminophen (NORCO/VICODIN) 5-325 MG tablet Take 1 tablet by mouth every 4 (four) hours as needed. 12/13/20   Isla Pence, MD  ibuprofen (ADVIL,MOTRIN) 800 MG tablet Take 0.5 tablets (400 mg total) by mouth every 6 (six) hours as needed (pain). 06/16/18   Fawze, Mina A, PA-C  metFORMIN (GLUCOPHAGE) 500 MG tablet Take 500 mg by mouth 2 (two) times daily with a meal.    [provider]  Multiple Vitamin (MULTIVITAMIN WITH MINERALS) TABS tablet Take 1 tablet by mouth daily. Patient not taking: No sig reported 02/21/20   Eugenie Filler, MD  nicotine (NICODERM CQ - DOSED IN MG/24 HOURS) 21 mg/24hr patch Place 1 patch (21 mg total) onto the skin daily. 02/21/20   Eugenie Filler, MD  ondansetron (ZOFRAN ODT) 4 MG disintegrating tablet Take 1 tablet (4 mg total) by mouth every 8 (eight) hours as needed for nausea or vomiting. 12/13/20   Isla Pence, MD  pantoprazole (PROTONIX) 40 MG tablet Take  1 tablet (40 mg total) by mouth daily. Patient not taking: No sig reported 02/20/20   Eugenie Filler, MD  phenazopyridine (PYRIDIUM) 200 MG tablet Take 1 tablet (200 mg total) by mouth 3 (three) times daily. 12/13/20   Isla Pence, MD  pravastatin (PRAVACHOL) 20 MG tablet Take 20 mg by mouth at bedtime.    [provider]  sucralfate (CARAFATE) 1 GM/10ML suspension Take 10 mLs (1 g total) by mouth 4 (four) times daily -  with meals and at bedtime. 02/20/20   Eugenie Filler, MD  thiamine 100 MG tablet Take 1 tablet (100 mg total) by mouth daily. 02/21/20   Eugenie Filler, MD  traMADol (ULTRAM) 50 MG tablet Take 50 mg by mouth 2 (two) times  daily as needed for severe pain. 10/05/20   [provider]  Vitamin D, Ergocalciferol, (DRISDOL) 1.25 MG (50000 UNIT) CAPS capsule Take 50,000 Units by mouth once a week. 02/14/20   [provider]    Allergies    Patient has no known allergies.  Review of Systems   Review of Systems  Constitutional: Negative for fever.  HENT: Negative for ear pain and sore throat.   Eyes: Negative for pain.  Respiratory: Negative for cough.   Cardiovascular: Negative for chest pain.  Gastrointestinal: Positive for abdominal pain.  Genitourinary: Negative for flank pain.  Musculoskeletal: Negative for back pain.  Skin: Negative for color change and rash.  Neurological: Negative for syncope.  All other systems reviewed and are negative.   Physical Exam Updated Vital Signs BP (!) 179/111   Pulse (!) 53   Temp 98.8 F (37.1 C) (Oral)   Resp 18   Ht 5\' 9"  (1.753 m)   Wt 74.8 kg   SpO2 97%   BMI 24.37 kg/m   Physical Exam Constitutional:      General: He is not in acute distress.    Appearance: He is well-developed.  HENT:     Head: Normocephalic.     Nose: Nose normal.  Eyes:     Extraocular Movements: Extraocular movements intact.  Cardiovascular:     Rate and Rhythm: Normal rate.  Pulmonary:     Effort:  Pulmonary effort is normal.  Abdominal:     Tenderness: There is no right CVA tenderness or left CVA tenderness.  Skin:    Coloration: Skin is not jaundiced.  Neurological:     Mental Status: He is alert. Mental status is at baseline.     ED Results / Procedures / Treatments   Labs (all labs ordered are listed, but only abnormal results are displayed) Labs Reviewed  URINALYSIS, ROUTINE W REFLEX MICROSCOPIC - Abnormal; Notable for the following components:      Result Value   Color, Urine AMBER (*)    APPearance TURBID (*)    pH 9.0 (*)    Hgb urine dipstick SMALL (*)    Ketones, ur 5 (*)    Protein, ur >=300 (*)    Leukocytes,Ua MODERATE (*)    RBC / HPF >50 (*)    WBC, UA >50 (*)    Bacteria, UA MANY (*)    Non Squamous Epithelial 6-10 (*)    All other components within normal limits  CBC WITH DIFFERENTIAL/PLATELET - Abnormal; Notable for the following components:   RBC 6.59 (*)    MCV 72.8 (*)    MCH 23.1 (*)    All other components within normal limits  COMPREHENSIVE METABOLIC PANEL - Abnormal; Notable for the following components:   CO2 20 (*)    Glucose, Bld 158 (*)    BUN 33 (*)    Creatinine, Ser 1.60 (*)    Total Protein 8.3 (*)    AST 43 (*)    GFR, Estimated 47 (*)    All other components within normal limits  URINE CULTURE    EKG None  Radiology No results found.  Procedures Procedures   Medications Ordered in ED Medications  acetaminophen (TYLENOL) tablet 650 mg (has no administration in time range)  hydrALAZINE (APRESOLINE) tablet 25 mg (25 mg Oral Given 01/26/21 1922)    ED Course  I have reviewed the triage vital signs and the nursing notes.  Pertinent labs & imaging results that were available during  my care of the patient were reviewed by me and considered in my medical decision making (see chart for details).    MDM Rules/Calculators/A&P                          Labs unremarkable white count of 5 hemoglobin 15 chemistry within  normal range for the patient.  Urinalysis shows concern for urinary tract infection.  Urine culture sent.  Looking at his past records patient does appear to have been sensitive to amoxicillin as well as nitrofurantoin multiple times.  Given prescription amoxicillin to go home with.  Advised to follow-up with his urologist within 3 to 4 days.  Advised immediate return if he has chest pain difficulty breathing fevers worsening symptoms or any additional concerns.   Final Clinical Impression(s) / ED Diagnoses Final diagnoses:  Acute cystitis with hematuria    Rx / DC Orders ED Discharge Orders         Ordered    amoxicillin (AMOXIL) 500 MG capsule  3 times daily        01/26/21 1936           Luna Fuse, MD 01/26/21 [id number]

## 2021-01-28 LAB — URINE CULTURE

## 2021-02-06 ENCOUNTER — Emergency Department (HOSPITAL_COMMUNITY)
Admission: EM | Admit: 2021-02-06 | Discharge: 2021-02-06 | Disposition: A | Discharge status: Home / Self Care | Payer: Medicare HMO | Attending: Emergency Medicine | Admitting: Emergency Medicine

## 2021-02-06 ENCOUNTER — Other Ambulatory Visit: Payer: Self-pay

## 2021-02-06 ENCOUNTER — Encounter (HOSPITAL_COMMUNITY): Payer: Self-pay

## 2021-02-06 ENCOUNTER — Other Ambulatory Visit (HOSPITAL_COMMUNITY): Payer: Self-pay | Admitting: Emergency Medicine

## 2021-02-06 ENCOUNTER — Emergency Department (HOSPITAL_COMMUNITY): Payer: Medicare HMO

## 2021-02-06 DIAGNOSIS — F17210 Nicotine dependence, cigarettes, uncomplicated: Secondary | ICD-10-CM | POA: Diagnosis not present

## 2021-02-06 DIAGNOSIS — R590 Localized enlarged lymph nodes: Secondary | ICD-10-CM | POA: Diagnosis not present

## 2021-02-06 DIAGNOSIS — E119 Type 2 diabetes mellitus without complications: Secondary | ICD-10-CM | POA: Insufficient documentation

## 2021-02-06 DIAGNOSIS — Z20822 Contact with and (suspected) exposure to covid-19: Secondary | ICD-10-CM | POA: Insufficient documentation

## 2021-02-06 DIAGNOSIS — I5032 Chronic diastolic (congestive) heart failure: Secondary | ICD-10-CM | POA: Diagnosis not present

## 2021-02-06 DIAGNOSIS — Z7982 Long term (current) use of aspirin: Secondary | ICD-10-CM | POA: Insufficient documentation

## 2021-02-06 DIAGNOSIS — R1031 Right lower quadrant pain: Secondary | ICD-10-CM | POA: Diagnosis not present

## 2021-02-06 DIAGNOSIS — Z79899 Other long term (current) drug therapy: Secondary | ICD-10-CM | POA: Insufficient documentation

## 2021-02-06 DIAGNOSIS — R339 Retention of urine, unspecified: Secondary | ICD-10-CM

## 2021-02-06 DIAGNOSIS — Z8673 Personal history of transient ischemic attack (TIA), and cerebral infarction without residual deficits: Secondary | ICD-10-CM | POA: Insufficient documentation

## 2021-02-06 DIAGNOSIS — N390 Urinary tract infection, site not specified: Secondary | ICD-10-CM

## 2021-02-06 DIAGNOSIS — Z7984 Long term (current) use of oral hypoglycemic drugs: Secondary | ICD-10-CM | POA: Insufficient documentation

## 2021-02-06 DIAGNOSIS — I110 Hypertensive heart disease with heart failure: Secondary | ICD-10-CM | POA: Insufficient documentation

## 2021-02-06 LAB — CBC
HCT: 42.8 % (ref 39.0–52.0)
Hemoglobin: 13.1 g/dL (ref 13.0–17.0)
MCH: 22.9 pg — ABNORMAL LOW (ref 26.0–34.0)
MCHC: 30.6 g/dL (ref 30.0–36.0)
MCV: 74.8 fL — ABNORMAL LOW (ref 80.0–100.0)
Platelets: 372 10*3/uL (ref 150–400)
RBC: 5.72 MIL/uL (ref 4.22–5.81)
RDW: 14.2 % (ref 11.5–15.5)
WBC: 10.1 10*3/uL (ref 4.0–10.5)
nRBC: 0 % (ref 0.0–0.2)

## 2021-02-06 LAB — URINALYSIS, ROUTINE W REFLEX MICROSCOPIC
Bilirubin Urine: NEGATIVE
Glucose, UA: NEGATIVE mg/dL
Ketones, ur: NEGATIVE mg/dL
Nitrite: NEGATIVE
Protein, ur: 100 mg/dL — AB
Specific Gravity, Urine: 1.009 (ref 1.005–1.030)
WBC, UA: >50 WBC/hpf — ABNORMAL HIGH (ref 0–5)
pH: 5 (ref 5.0–8.0)

## 2021-02-06 LAB — COMPREHENSIVE METABOLIC PANEL
ALT: 18 U/L (ref 0–44)
AST: 22 U/L (ref 15–41)
Albumin: 3.7 g/dL (ref 3.5–5.0)
Alkaline Phosphatase: 45 U/L (ref 38–126)
Anion gap: 12 (ref 5–15)
BUN: 8 mg/dL (ref 8–23)
CO2: 27 mmol/L (ref 22–32)
Calcium: 9.4 mg/dL (ref 8.9–10.3)
Chloride: 100 mmol/L (ref 98–111)
Creatinine, Ser: 0.85 mg/dL (ref 0.61–1.24)
GFR, Estimated: >60 mL/min (ref >60)
Glucose, Bld: 165 mg/dL — ABNORMAL HIGH (ref 70–99)
Potassium: 3.7 mmol/L (ref 3.5–5.1)
Sodium: 139 mmol/L (ref 135–145)
Total Bilirubin: 1.4 mg/dL — ABNORMAL HIGH (ref 0.3–1.2)
Total Protein: 8.1 g/dL (ref 6.5–8.1)

## 2021-02-06 LAB — RESP PANEL BY RT-PCR (FLU A&B, COVID) ARPGX2
Influenza A by PCR: NEGATIVE
Influenza B by PCR: NEGATIVE
SARS Coronavirus 2 by RT PCR: NEGATIVE

## 2021-02-06 LAB — LIPASE, BLOOD: Lipase: 23 U/L (ref 11–51)

## 2021-02-06 MED ORDER — MORPHINE SULFATE (PF) 4 MG/ML IV SOLN
6.0000 mg | Freq: Once | INTRAVENOUS | Status: AC
  Administered 2021-02-06: 6 mg via INTRAVENOUS
  Filled 2021-02-06: qty 2

## 2021-02-06 MED ORDER — CEPHALEXIN 500 MG PO CAPS: 500.0000 mg | ORAL_CAPSULE | Freq: Four times a day (QID) | ORAL | 0 refills | Status: DC

## 2021-02-06 MED ORDER — SODIUM CHLORIDE 0.9 % IV SOLN: INTRAVENOUS | Status: DC

## 2021-02-06 MED ORDER — IOHEXOL 300 MG/ML  SOLN
100.0000 mL | Freq: Once | INTRAMUSCULAR | Status: AC | PRN
  Administered 2021-02-06: 100 mL via INTRAVENOUS

## 2021-02-06 MED ORDER — SODIUM CHLORIDE 0.9 % IV SOLN
1.0000 g | INTRAVENOUS | Status: DC
  Administered 2021-02-06: 1 g via INTRAVENOUS
  Filled 2021-02-06: qty 10

## 2021-02-06 NOTE — Consult Note (Signed)
Urology Consult  Consulting MD: Lacretia Leigh, MD  CC: Distended bladder, difficult catheter placement  HPI: This is a 70year old male previously seen by Dr. Karsten Ro of our practice in 2019 for urinary retention.  This required Foley catheter placement.  The patient did not return for follow-up.  He has been treated for several urinary tract infections in the emergency room over the past few months.  He presented today with abdominal pain.  Evaluation revealed the patient to have infected appearing urine and a significantly distended bladder with diverticula formation on CT scan.  Urologic consultation is requested after Foley catheter attempt x3 failed.  The patient, over a long period of time, states that he has a fairly good urinary stream.  He does not have significant urinary incontinence during the day or nighttime.  He does have current abdominal/bladder pain.  He has had darker urine recently.  PMH: Past Medical History:  Diagnosis Date  . Alcohol dependence ([place]) 12/10/2013  . Angioedema of lips 12/24/2013  . Brain tumor ([place])   . CHF (congestive heart failure) (HCC)    diastolic  . Chronic back pain   . Chronic headache   . CVA (cerebral vascular accident) ([place])   . Diabetes mellitus   . 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 HTN (hypertension), malignant 09/06/2012  . Hypertension   . Narcotic abuse ([place])   . Normal cardiac stress test 2009  . TIA (transient ischemic attack) 09/06/2012  . Tobacco use disorder 12/10/2013  . UTI (urinary tract infection)   . Vertigo 09/06/2012    PSH: Past Surgical History:  Procedure Laterality Date  . NO PAST SURGERIES      Allergies: No Known Allergies  Medications: (Not in a hospital admission)    Social History: Social History   Socioeconomic History  . Marital status: Legally Separated    Spouse name: Not on file  . Number of children: Not on file  . Years of education: Not on file  . Highest  education level: Not on file  Occupational History  . Not on file  Tobacco Use  . Smoking status: Current Every Day Smoker    Packs/day: 0.50    Types: Cigarettes  . Smokeless tobacco: Current User  Vaping Use  . Vaping Use: Never used  Substance and Sexual Activity  . Alcohol use: Yes  . Drug use: Yes    Types: Marijuana, Cocaine, IV    Comment: Heroin - one month ago  . Sexual activity: Not on file  Other Topics Concern  . Not on file  Social History Narrative   Single.  Lives alone.     Social Determinants of Health   Financial Resource Strain: Not on file  Food Insecurity: Not on file  Transportation Needs: Not on file  Physical Activity: Not on file  Stress: Not on file  Social Connections: Not on file  Intimate Partner Violence: Not on file    Family History: Family History  Problem Relation Age of Onset  . CAD Father        "heart attack in his 26's"  . Hypertension Father   . Diabetes Brother   . Diabetes Sister   . Hypertension Mother     Review of Systems: Positive: Slow stream, dark urine, dysuria, lower abdominal pain Negative:  A further 10 point review of systems was negative except what is listed in the HPI.  Physical Exam: @VITALS2 @ General: No acute distress.  Awake.  Patient quite thin  Head:  Normocephalic.  Atraumatic. ENT:  EOMI.  Mucous membranes moist Neck:  Supple.  No lymphadenopathy. CV:  Regular rate. Pulmonary: Equal effort bilaterally.   Abdomen: Mildly distended abdomen.  Suprapubic tenderness.  Bladder palpable to umbilicus. Skin:  Normal turgor.  No visible rash. Extremity: No gross deformity of extremities.  Neurologic: Alert. Appropriate mood. Penis:  Circumcised.  No lesions. Urethra: Orthotopic meatus. Scrotum: No lesions.  No ecchymosis.  No erythema. Testicles: Descended bilaterally.  No masses bilaterally. Epididymis: Palpable bilaterally.  Non Tender to palpation.  Studies:  Recent Labs    02/06/21 1244  HGB  13.1  WBC 10.1  PLT 372    Recent Labs    02/06/21 1244  NA 139  K 3.7  CL 100  CO2 27  BUN 8  CREATININE 0.85  CALCIUM 9.4  GFRNONAA >60    I have reviewed prior pt notes  I have reviewed notes from referring/previous physicians  I have reviewed urinalysis results  I have independently reviewed prior imaging  Procedure note: With the patient's verbal consent, his genitalia and perineum were prepped and draped.  20 mL of 2% viscous lidocaine was introduced into his urethra.  I first tried catheter placement with an 77 French coud tip catheter.  This hung up in his mid urethra.  I then utilized a 16 [place] coud tip catheter which with some difficulty did pass into his bladder.  Once there, balloon filled with 10 cc of water.  Catheter was hooked to dependent drainage.  Before I left the room over 1000 mL of urine was in the back.    Assessment: 1.  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  He does have significant urinary retention from this with distended bladder and diverticula formation  2.  Probable UTI  Plan: 1.  I think it is fine to let the patient go home today with a catheter  2.  Treat UTI appropriately  3.  I have asked the patient to call our office to set up an appointment within the next 7 to 10 days for follow-up.  He will eventually need cystoscopy.  Consider urodynamics.    [phone]

## 2021-02-06 NOTE — ED Notes (Signed)
Bladder scan 559ml

## 2021-02-06 NOTE — ED Notes (Signed)
Attempted to insert foley/ coude x2 with resistance when reaching prostate, charge nurse is going to attempt

## 2021-02-06 NOTE — Discharge Instructions (Addendum)

## 2021-02-06 NOTE — ED Notes (Signed)
Attempted coude cath #20, unable to pass prostate. Dr Zenia Resides aware and will notify Urology.

## 2021-02-06 NOTE — ED Provider Notes (Signed)
[facility] DEPT Provider Note   CSN: [id number] Arrival date & time: 02/06/21  1155     History Chief Complaint  Patient presents with  . Abdominal Pain    [name] is a 70 y.o. male.  70 year old male presents with several days of suprapubic as well as right lower quadrant pain.  Patient treated for UTI 2 weeks ago and completed the full course of therapy.  Denies any fever or chills.  No vomiting or diarrhea.  Denies any urinary symptoms.  States his pain is constant and characterizes sharp and worse with movement.  Pain is also worse when he tries to move his bowels.  No treatment use prior to arrival.  No history of same.        Past Medical History:  Diagnosis Date  . Alcohol dependence ([place]) 12/10/2013  . Angioedema of lips 12/24/2013  . Brain tumor ([place])   . CHF (congestive heart failure) (HCC)    diastolic  . Chronic back pain   . Chronic headache   . CVA (cerebral vascular accident) ([place])   . Diabetes mellitus   . 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[place]) 02/18/2020  . HTN (hypertension), malignant 09/06/2012  . Hypertension   . Narcotic abuse ([place])   . Normal cardiac stress test 2009  . TIA (transient ischemic attack) 09/06/2012  . Tobacco use disorder 12/10/2013  . UTI (urinary tract infection)   . Vertigo 09/06/2012    Patient Active Problem List   Diagnosis Date Noted  . Complicated UTI (urinary tract infection) 02/18/2020  . 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[place]) 02/18/2020  . Epigastric abdominal pain   . Epigastric pain 02/16/2020  . Hypertensive urgency 02/16/2020  . Cardiac murmur 02/16/2020  . Acute pyelonephritis 02/16/2020  . Syncope 01/23/2017  . Elevated troponin   . Nausea 11/01/2016  . Dysuria 11/01/2016  . Chronic diastolic heart failure ([place]) 03/20/2015  . Chest pain 03/18/2015  . History of TIA (transient ischemic attack) 01/27/2015  . Enterococcus UTI 01/27/2015  . Cough  01/27/2015  . Bronchitis 01/27/2015  . HCAP (healthcare-associated pneumonia)   . CHF (congestive heart failure) ([place]) 01/26/2015  . History of alcohol abuse 01/26/2015  . Pyelonephritis 01/12/2015  . Prostatitis 01/12/2015  . Essential hypertension 11/03/2014  . Hypokalemia 11/03/2014  . Fever 08/15/2014  . Chronic pain 12/24/2013  . Tobacco use disorder 12/10/2013  . Leukocytosis 12/10/2013  . Diabetes mellitus type 2, controlled ([place]) 09/06/2012  . Pain in the side 09/06/2012  . Pituitary adenoma ([place]) 09/06/2012    Past Surgical History:  Procedure Laterality Date  . NO PAST SURGERIES         Family History  Problem Relation Age of Onset  . CAD Father        "heart attack in his 39's"  . Hypertension Father   . Diabetes Brother   . Diabetes Sister   . Hypertension Mother     Social History   Tobacco Use  . Smoking status: Current Every Day Smoker    Packs/day: 0.50    Types: Cigarettes  . Smokeless tobacco: Current User  Vaping Use  . Vaping Use: Never used  Substance Use Topics  . Alcohol use: Yes  . Drug use: Yes    Types: Marijuana, Cocaine, IV    Comment: Heroin - one month ago    Home Medications Prior to Admission medications   Medication Sig Start Date End Date Taking? Authorizing Provider  albuterol (PROVENTIL HFA;VENTOLIN HFA) 108 (  90 Base) MCG/ACT inhaler Inhale 1-2 puffs into the lungs every 6 (six) hours as needed for wheezing. 09/04/16   Tanna Furry, MD  amLODipine (NORVASC) 10 MG tablet Take 10 mg by mouth daily.    [provider]  aspirin EC 81 MG tablet Take 81 mg by mouth daily.    [provider]  cyclobenzaprine (FLEXERIL) 10 MG tablet Take 1 tablet (10 mg total) by mouth 2 (two) times daily as needed for muscle spasms. Patient not taking: No sig reported 03/08/20   Ripley Fraise, MD  diltiazem (CARDIZEM CD) 240 MG 24 hr capsule Take 1 capsule (240 mg total) by mouth daily. 02/21/20   Eugenie Filler, MD  docusate  sodium (COLACE) 100 MG capsule Take 1 capsule (100 mg total) by mouth every 12 (twelve) hours. Patient taking differently: Take 100 mg by mouth daily. 01/02/18   Little, Wenda Overland, MD  folic acid (FOLVITE) 1 MG tablet Take 1 tablet (1 mg total) by mouth daily. 02/21/20   Eugenie Filler, MD  gabapentin (NEURONTIN) 100 MG capsule Take 100 mg by mouth 2 (two) times daily. 06/10/16   [provider]  hydrALAZINE (APRESOLINE) 100 MG tablet Take 100 mg by mouth 3 (three) times daily. 07/08/20   [provider]  hydrALAZINE (APRESOLINE) 25 MG tablet Take 3 tablets (75 mg total) by mouth every 8 (eight) hours. Patient not taking: Reported on 12/13/2020 02/20/20   Eugenie Filler, MD  hydrochlorothiazide (MICROZIDE) 12.5 MG capsule Take 1 capsule (12.5 mg total) by mouth daily. Patient not taking: No sig reported 02/20/20   Eugenie Filler, MD  HYDROcodone-acetaminophen (NORCO/VICODIN) 5-325 MG tablet Take 1 tablet by mouth every 4 (four) hours as needed. 12/13/20   Isla Pence, MD  ibuprofen (ADVIL,MOTRIN) 800 MG tablet Take 0.5 tablets (400 mg total) by mouth every 6 (six) hours as needed (pain). 06/16/18   Fawze, Mina A, PA-C  metFORMIN (GLUCOPHAGE) 500 MG tablet Take 500 mg by mouth 2 (two) times daily with a meal.    [provider]  Multiple Vitamin (MULTIVITAMIN WITH MINERALS) TABS tablet Take 1 tablet by mouth daily. Patient not taking: No sig reported 02/21/20   Eugenie Filler, MD  nicotine (NICODERM CQ - DOSED IN MG/24 HOURS) 21 mg/24hr patch Place 1 patch (21 mg total) onto the skin daily. 02/21/20   Eugenie Filler, MD  ondansetron (ZOFRAN ODT) 4 MG disintegrating tablet Take 1 tablet (4 mg total) by mouth every 8 (eight) hours as needed for nausea or vomiting. 12/13/20   Isla Pence, MD  pantoprazole (PROTONIX) 40 MG tablet Take 1 tablet (40 mg total) by mouth daily. Patient not taking: No sig reported 02/20/20   Eugenie Filler, MD   phenazopyridine (PYRIDIUM) 200 MG tablet Take 1 tablet (200 mg total) by mouth 3 (three) times daily. 12/13/20   Isla Pence, MD  pravastatin (PRAVACHOL) 20 MG tablet Take 20 mg by mouth at bedtime.    [provider]  sucralfate (CARAFATE) 1 GM/10ML suspension Take 10 mLs (1 g total) by mouth 4 (four) times daily -  with meals and at bedtime. 02/20/20   Eugenie Filler, MD  thiamine 100 MG tablet Take 1 tablet (100 mg total) by mouth daily. 02/21/20   Eugenie Filler, MD  traMADol (ULTRAM) 50 MG tablet Take 50 mg by mouth 2 (two) times daily as needed for severe pain. 10/05/20   [provider]  Vitamin D, Ergocalciferol, (DRISDOL) 1.25  MG (50000 UNIT) CAPS capsule Take 50,000 Units by mouth once a week. 02/14/20   [provider]    Allergies    Patient has no known allergies.  Review of Systems   Review of Systems  All other systems reviewed and are negative.   Physical Exam Updated Vital Signs BP (!) 129/47 (BP Location: Left Arm)   Pulse 74   Temp 99.4 F (37.4 C) (Oral)   Resp 17   SpO2 98%   Physical Exam Vitals and nursing note reviewed.  Constitutional:      General: He is not in acute distress.    Appearance: Normal appearance. He is well-developed and well-nourished. He is not toxic-appearing.  HENT:     Head: Normocephalic and atraumatic.  Eyes:     General: Lids are normal.     Extraocular Movements: EOM normal.     Conjunctiva/sclera: Conjunctivae normal.     Pupils: Pupils are equal, round, and reactive to light.  Neck:     Thyroid: No thyroid mass.     Trachea: No tracheal deviation.  Cardiovascular:     Rate and Rhythm: Normal rate and regular rhythm.     Heart sounds: Normal heart sounds. No murmur heard. No gallop.   Pulmonary:     Effort: Pulmonary effort is normal. No respiratory distress.     Breath sounds: Normal breath sounds. No stridor. No decreased breath sounds, wheezing, rhonchi or rales.  Abdominal:      General: Bowel sounds are normal. There is no distension.     Palpations: Abdomen is soft.     Tenderness: There is abdominal tenderness in the right lower quadrant. There is no CVA tenderness or rebound.    Genitourinary:    Penis: Circumcised.   Musculoskeletal:        General: No tenderness or edema. Normal range of motion.     Cervical back: Normal range of motion and neck supple.  Lymphadenopathy:     Lower Body: Left inguinal adenopathy present.  Skin:    General: Skin is warm and dry.     Findings: No abrasion or rash.  Neurological:     Mental Status: He is alert and oriented to person, place, and time.     GCS: GCS eye subscore is 4. GCS verbal subscore is 5. GCS motor subscore is 6.     Cranial Nerves: No cranial nerve deficit.     Sensory: No sensory deficit.     Deep Tendon Reflexes: Strength normal.  Psychiatric:        Mood and Affect: Mood and affect normal.        Speech: Speech normal.        Behavior: Behavior normal.     ED Results / Procedures / Treatments   Labs (all labs ordered are listed, but only abnormal results are displayed) Labs Reviewed  COMPREHENSIVE METABOLIC PANEL - Abnormal; Notable for the following components:      Result Value   Glucose, Bld 165 (*)    Total Bilirubin 1.4 (*)    All other components within normal limits  CBC - Abnormal; Notable for the following components:   MCV 74.8 (*)    MCH 22.9 (*)    All other components within normal limits  LIPASE, BLOOD  URINALYSIS, ROUTINE W REFLEX MICROSCOPIC    EKG None  Radiology No results found.  Procedures Procedures   Medications Ordered in ED Medications  0.9 %  sodium chloride infusion (has no  administration in time range)    ED Course  I have reviewed the triage vital signs and the nursing notes.  Pertinent labs & imaging results that were available during my care of the patient were reviewed by me and considered in my medical decision making (see chart for  details).    MDM Rules/Calculators/A&P                         She medicated for pain here. Patient was seen by urology after Foley catheter cannot be passed.  Dr. Diona Fanti from urology reviewed the patient's scans and will see the patient in follow-up.  Will prescribe patient Keflex and urine cultures Final Clinical Impression(s) / ED Diagnoses Final diagnoses:  None    Rx / DC Orders ED Discharge Orders    None       Lacretia Leigh, MD 02/06/21 1842

## 2021-02-06 NOTE — ED Triage Notes (Signed)
Pt presents with c/o abdominal pain. Pt was diagnosed with a UTI earlier this week and reports that his abdomen started hurting after he took the antibiotics.

## 2021-02-06 NOTE — ED Notes (Signed)
Patient provided leg bag, patients instructed on how to empty bag. Patient assisted with getting dress. Patient would like to keep drainage bag at this time.

## 2021-02-06 NOTE — ED Notes (Signed)
Urology and urology cart from OR at bedside.

## 2021-02-14 ENCOUNTER — Other Ambulatory Visit (HOSPITAL_COMMUNITY): Payer: Self-pay | Admitting: Adult Health

## 2021-04-06 ENCOUNTER — Encounter (HOSPITAL_COMMUNITY): Payer: Self-pay

## 2021-04-06 ENCOUNTER — Other Ambulatory Visit: Payer: Self-pay

## 2021-04-06 ENCOUNTER — Emergency Department (HOSPITAL_COMMUNITY)
Admission: EM | Admit: 2021-04-06 | Discharge: 2021-04-06 | Disposition: A | Discharge status: Home / Self Care | Payer: Medicare HMO | Attending: Emergency Medicine | Admitting: Emergency Medicine

## 2021-04-06 DIAGNOSIS — E119 Type 2 diabetes mellitus without complications: Secondary | ICD-10-CM | POA: Insufficient documentation

## 2021-04-06 DIAGNOSIS — F17210 Nicotine dependence, cigarettes, uncomplicated: Secondary | ICD-10-CM | POA: Insufficient documentation

## 2021-04-06 DIAGNOSIS — N3000 Acute cystitis without hematuria: Secondary | ICD-10-CM | POA: Diagnosis not present

## 2021-04-06 DIAGNOSIS — I5032 Chronic diastolic (congestive) heart failure: Secondary | ICD-10-CM | POA: Diagnosis not present

## 2021-04-06 DIAGNOSIS — T839XXA Unspecified complication of genitourinary prosthetic device, implant and graft, initial encounter: Secondary | ICD-10-CM

## 2021-04-06 DIAGNOSIS — I110 Hypertensive heart disease with heart failure: Secondary | ICD-10-CM | POA: Insufficient documentation

## 2021-04-06 DIAGNOSIS — Z7982 Long term (current) use of aspirin: Secondary | ICD-10-CM | POA: Insufficient documentation

## 2021-04-06 DIAGNOSIS — Z7984 Long term (current) use of oral hypoglycemic drugs: Secondary | ICD-10-CM | POA: Diagnosis not present

## 2021-04-06 DIAGNOSIS — R1030 Lower abdominal pain, unspecified: Secondary | ICD-10-CM | POA: Diagnosis present

## 2021-04-06 DIAGNOSIS — Z79899 Other long term (current) drug therapy: Secondary | ICD-10-CM | POA: Insufficient documentation

## 2021-04-06 DIAGNOSIS — T83031A Leakage of indwelling urethral catheter, initial encounter: Secondary | ICD-10-CM | POA: Diagnosis not present

## 2021-04-06 LAB — CBC WITH DIFFERENTIAL/PLATELET
Abs Immature Granulocytes: 0.07 10*3/uL (ref 0.00–0.07)
Basophils Absolute: 0.1 10*3/uL (ref 0.0–0.1)
Basophils Relative: 1 %
Eosinophils Absolute: 0.3 10*3/uL (ref 0.0–0.5)
Eosinophils Relative: 3 %
HCT: 44.4 % (ref 39.0–52.0)
Hemoglobin: 13.6 g/dL (ref 13.0–17.0)
Immature Granulocytes: 1 %
Lymphocytes Relative: 29 %
Lymphs Abs: 3.3 10*3/uL (ref 0.7–4.0)
MCH: 23.1 pg — ABNORMAL LOW (ref 26.0–34.0)
MCHC: 30.6 g/dL (ref 30.0–36.0)
MCV: 75.4 fL — ABNORMAL LOW (ref 80.0–100.0)
Monocytes Absolute: 0.8 10*3/uL (ref 0.1–1.0)
Monocytes Relative: 7 %
Neutro Abs: 6.7 10*3/uL (ref 1.7–7.7)
Neutrophils Relative %: 59 %
Platelets: 321 10*3/uL (ref 150–400)
RBC: 5.89 MIL/uL — ABNORMAL HIGH (ref 4.22–5.81)
RDW: 16.7 % — ABNORMAL HIGH (ref 11.5–15.5)
WBC: 11.2 10*3/uL — ABNORMAL HIGH (ref 4.0–10.5)
nRBC: 0 % (ref 0.0–0.2)

## 2021-04-06 LAB — BASIC METABOLIC PANEL
Anion gap: 6 (ref 5–15)
BUN: 14 mg/dL (ref 8–23)
CO2: 27 mmol/L (ref 22–32)
Calcium: 9.7 mg/dL (ref 8.9–10.3)
Chloride: 107 mmol/L (ref 98–111)
Creatinine, Ser: 0.74 mg/dL (ref 0.61–1.24)
GFR, Estimated: >60 mL/min (ref >60)
Glucose, Bld: 150 mg/dL — ABNORMAL HIGH (ref 70–99)
Potassium: 3.9 mmol/L (ref 3.5–5.1)
Sodium: 140 mmol/L (ref 135–145)

## 2021-04-06 LAB — URINALYSIS, ROUTINE W REFLEX MICROSCOPIC
Bacteria, UA: NONE SEEN
Bilirubin Urine: NEGATIVE
Glucose, UA: NEGATIVE mg/dL
Ketones, ur: NEGATIVE mg/dL
Nitrite: NEGATIVE
Protein, ur: NEGATIVE mg/dL
Specific Gravity, Urine: 1.023 (ref 1.005–1.030)
pH: 6 (ref 5.0–8.0)

## 2021-04-06 MED ORDER — LIDOCAINE HCL URETHRAL/MUCOSAL 2 % EX GEL
1.0000 "application " | Freq: Once | CUTANEOUS | Status: AC
  Administered 2021-04-06: 1 via TOPICAL
  Filled 2021-04-06: qty 11

## 2021-04-06 MED ORDER — SULFAMETHOXAZOLE-TRIMETHOPRIM 800-160 MG PO TABS: 1.0000 | ORAL_TABLET | Freq: Two times a day (BID) | ORAL | 0 refills | Status: DC

## 2021-04-06 MED ORDER — ONDANSETRON 4 MG PO TBDP: 4.0000 mg | ORAL_TABLET | Freq: Once | ORAL | Status: DC

## 2021-04-06 NOTE — ED Triage Notes (Signed)
Pt states that foley catheter is not draining and he is having urine leak out of his penis. Started 0500 am. Reports abdomen feels full.

## 2021-04-06 NOTE — ED Notes (Signed)
Patient discharge instructions reviewed with the patient. The patient verbalized understanding of instructions. Patient discharged. 

## 2021-04-06 NOTE — ED Provider Notes (Signed)
[facility] EMERGENCY DEPARTMENT Provider Note   CSN: [id number] Arrival date & time: 04/06/21  0554     History Chief Complaint  Patient presents with  . Foley Catheter Issue    [name] is a 70 y.o. male.  Leaking around foley catheter that was placed due to significant BPH symptoms, pain in suprapubic area.  The catheter bag is not been feeling up like normal.  Denies fevers nausea vomiting abdominal pain.  Normal bowel movements.  No recent trauma.  The history is provided by the patient.       Past Medical History:  Diagnosis Date  . Alcohol dependence ([place]) 12/10/2013  . Angioedema of lips 12/24/2013  . Brain tumor ([place])   . CHF (congestive heart failure) ([facility])    diastolic  . Chronic back pain   . Chronic headache   . CVA (cerebral vascular accident) ([place])   . Diabetes mellitus   . 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[place]) 02/18/2020  . HTN (hypertension), malignant 09/06/2012  . Hypertension   . Narcotic abuse ([place])   . Normal cardiac stress test 2009  . TIA (transient ischemic attack) 09/06/2012  . Tobacco use disorder 12/10/2013  . UTI (urinary tract infection)   . Vertigo 09/06/2012    Patient Active Problem List   Diagnosis Date Noted  . Complicated UTI (urinary tract infection) 02/18/2020  . 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[place]) 02/18/2020  . Epigastric abdominal pain   . Epigastric pain 02/16/2020  . Hypertensive urgency 02/16/2020  . Cardiac murmur 02/16/2020  . Acute pyelonephritis 02/16/2020  . Syncope 01/23/2017  . Elevated troponin   . Nausea 11/01/2016  . Dysuria 11/01/2016  . Chronic diastolic heart failure ([place]) 03/20/2015  . Chest pain 03/18/2015  . History of TIA (transient ischemic attack) 01/27/2015  . Enterococcus UTI 01/27/2015  . Cough 01/27/2015  . Bronchitis 01/27/2015  . HCAP (healthcare-associated pneumonia)   . CHF (congestive heart failure) ([place]) 01/26/2015  . History of  alcohol abuse 01/26/2015  . Pyelonephritis 01/12/2015  . Prostatitis 01/12/2015  . Essential hypertension 11/03/2014  . Hypokalemia 11/03/2014  . Fever 08/15/2014  . Chronic pain 12/24/2013  . Tobacco use disorder 12/10/2013  . Leukocytosis 12/10/2013  . Diabetes mellitus type 2, controlled ([place]) 09/06/2012  . Pain in the side 09/06/2012  . Pituitary adenoma ([place]) 09/06/2012    Past Surgical History:  Procedure Laterality Date  . NO PAST SURGERIES         Family History  Problem Relation Age of Onset  . CAD Father        "heart attack in his 34's"  . Hypertension Father   . Diabetes Brother   . Diabetes Sister   . Hypertension Mother     Social History   Tobacco Use  . Smoking status: Current Every Day Smoker    Packs/day: 0.50    Types: Cigarettes  . Smokeless tobacco: Current User  Vaping Use  . Vaping Use: Never used  Substance Use Topics  . Alcohol use: Yes  . Drug use: Yes    Types: Marijuana, Cocaine, IV    Comment: Heroin - one month ago    Home Medications Prior to Admission medications   Medication Sig Start Date End Date Taking? Authorizing Provider  sulfamethoxazole-trimethoprim (BACTRIM DS) 800-160 MG tablet Take 1 tablet by mouth 2 (two) times daily for 7 days. 04/06/21 04/13/21 Yes Breck Coons, MD  albuterol (PROVENTIL HFA;VENTOLIN HFA) [phone] Base) MCG/ACT inhaler Inhale 1-2  puffs into the lungs every 6 (six) hours as needed for wheezing. 09/04/16   Tanna Furry, MD  amLODipine (NORVASC) 10 MG tablet Take 10 mg by mouth daily.    [provider]  aspirin EC 81 MG tablet Take 81 mg by mouth daily.    [provider]  butalbital-acetaminophen-caffeine (FIORICET) 50-325-40 MG tablet TAKE 1 TABLET BY MOUTH EVERY 6 HOURS AS NEEDED FOR HEADACHES 10/04/20 10/04/21  Nolene Ebbs, MD  cephALEXin (KEFLEX) 500 MG capsule TAKE 1 CAPSULE (500 MG TOTAL) BY MOUTH 4 (FOUR) TIMES DAILY. 02/06/21 02/06/22  Lacretia Leigh, MD  cyclobenzaprine (FLEXERIL)  10 MG tablet Take 1 tablet (10 mg total) by mouth 2 (two) times daily as needed for muscle spasms. Patient not taking: No sig reported 03/08/20   Ripley Fraise, MD  diltiazem (CARDIZEM CD) 240 MG 24 hr capsule Take 1 capsule (240 mg total) by mouth daily. 02/21/20   Eugenie Filler, MD  docusate sodium (COLACE) 100 MG capsule Take 1 capsule (100 mg total) by mouth every 12 (twelve) hours. Patient taking differently: Take 100 mg by mouth daily. 01/02/18   Little, Wenda Overland, MD  folic acid (FOLVITE) 1 MG tablet Take 1 tablet (1 mg total) by mouth daily. 02/21/20   Eugenie Filler, MD  gabapentin (NEURONTIN) 100 MG capsule Take 100 mg by mouth 2 (two) times daily. 06/10/16   [provider]  hydrALAZINE (APRESOLINE) 100 MG tablet Take 100 mg by mouth 3 (three) times daily. 07/08/20   [provider]  hydrALAZINE (APRESOLINE) 25 MG tablet Take 3 tablets (75 mg total) by mouth every 8 (eight) hours. Patient not taking: No sig reported 02/20/20   Eugenie Filler, MD  hydrochlorothiazide (MICROZIDE) 12.5 MG capsule Take 1 capsule (12.5 mg total) by mouth daily. Patient not taking: No sig reported 02/20/20   Eugenie Filler, MD  HYDROcodone-acetaminophen (NORCO/VICODIN) 5-325 MG tablet TAKE 1 TABLET BY MOUTH EVERY 4 HOURS AS NEEDED. Patient not taking: No sig reported 12/13/20 06/11/21  Isla Pence, MD  ibuprofen (ADVIL,MOTRIN) 800 MG tablet Take 0.5 tablets (400 mg total) by mouth every 6 (six) hours as needed (pain). Patient taking differently: Take 800 mg by mouth 3 (three) times daily. 06/16/18   Fawze, Mina A, PA-C  losartan-hydrochlorothiazide (HYZAAR) 100-12.5 MG tablet Take 1 tablet by mouth daily. 01/02/21   [provider]  metFORMIN (GLUCOPHAGE) 500 MG tablet Take 500 mg by mouth 2 (two) times daily with a meal.    [provider]  Multiple Vitamin (MULTIVITAMIN WITH MINERALS) TABS tablet Take 1 tablet by mouth daily. Patient not taking: No sig  reported 02/21/20   Eugenie Filler, MD  nicotine (NICODERM CQ - DOSED IN MG/24 HOURS) 21 mg/24hr patch Place 1 patch (21 mg total) onto the skin daily. 02/21/20   Eugenie Filler, MD  ondansetron (ZOFRAN-ODT) 4 MG disintegrating tablet TAKE 1 TABLET (4 MG TOTAL) BY MOUTH EVERY 8 (EIGHT) HOURS AS NEEDED FOR NAUSEA OR VOMITING. 12/13/20 12/13/21  Isla Pence, MD  pantoprazole (PROTONIX) 40 MG tablet Take 1 tablet (40 mg total) by mouth daily. Patient not taking: No sig reported 02/20/20   Eugenie Filler, MD  phenazopyridine (PYRIDIUM) 200 MG tablet Take 1 tablet (200 mg total) by mouth 3 (three) times daily. Patient not taking: No sig reported 12/13/20   Isla Pence, MD  sucralfate (CARAFATE) 1 GM/10ML suspension Take 10 mLs (1 g total) by mouth 4 (four) times daily -  with meals  and at bedtime. 02/20/20   Eugenie Filler, MD  tamsulosin (FLOMAX) 0.4 MG CAPS capsule TAKE 1 CAPSULE BY MOUTH TWICE DAILY 02/14/21 02/14/22  Hollace Hayward, NP  thiamine 100 MG tablet Take 1 tablet (100 mg total) by mouth daily. 02/21/20   Eugenie Filler, MD  traMADol (ULTRAM) 50 MG tablet Take 50 mg by mouth 2 (two) times daily as needed for severe pain. 10/05/20   [provider]  Vitamin D, Ergocalciferol, (DRISDOL) 1.25 MG (50000 UNIT) CAPS capsule Take 50,000 Units by mouth once a week. 02/14/20   [provider]    Allergies    Patient has no known allergies.  Review of Systems   Review of Systems  Constitutional: Negative for chills and fever.  HENT: Negative for congestion and rhinorrhea.   Respiratory: Negative for cough and shortness of breath.   Cardiovascular: Negative for chest pain and palpitations.  Gastrointestinal: Positive for abdominal pain. Negative for diarrhea, nausea and vomiting.  Genitourinary: Positive for difficulty urinating and penile pain. Negative for dysuria.  Musculoskeletal: Negative for arthralgias and back pain.  Skin: Negative for color  change and rash.  Neurological: Negative for light-headedness and headaches.    Physical Exam Updated Vital Signs BP (!) 178/52 (BP Location: Right Arm)   Pulse (!) 55   Temp 98.1 F (36.7 C) (Oral)   Resp 18   Ht 5\' 9"  (1.753 m)   Wt 71.7 kg   SpO2 99%   BMI 23.33 kg/m   Physical Exam Vitals and nursing note reviewed. Exam conducted with a chaperone present.  Constitutional:      General: He is not in acute distress.    Appearance: Normal appearance.  HENT:     Head: Normocephalic and atraumatic.     Nose: No rhinorrhea.  Eyes:     General:        Right eye: No discharge.        Left eye: No discharge.     Conjunctiva/sclera: Conjunctivae normal.  Cardiovascular:     Rate and Rhythm: Normal rate and regular rhythm.  Pulmonary:     Effort: Pulmonary effort is normal.     Breath sounds: No stridor.  Abdominal:     General: Abdomen is flat. There is no distension.     Palpations: Abdomen is soft.     Tenderness: There is abdominal tenderness (suprapubic area). There is no guarding or rebound.  Genitourinary:    Penis: Normal.   Musculoskeletal:        General: No deformity or signs of injury.  Skin:    General: Skin is warm and dry.  Neurological:     General: No focal deficit present.     Mental Status: He is alert. Mental status is at baseline.     Motor: No weakness.  Psychiatric:        Mood and Affect: Mood normal.        Behavior: Behavior normal.        Thought Content: Thought content normal.     ED Results / Procedures / Treatments   Labs (all labs ordered are listed, but only abnormal results are displayed) Labs Reviewed  URINALYSIS, ROUTINE W REFLEX MICROSCOPIC - Abnormal; Notable for the following components:      Result Value   Color, Urine AMBER (*)    APPearance HAZY (*)    Hgb urine dipstick SMALL (*)    Leukocytes,Ua LARGE (*)    All other components within  normal limits  CBC WITH DIFFERENTIAL/PLATELET - Abnormal; Notable for the  following components:   WBC 11.2 (*)    RBC 5.89 (*)    MCV 75.4 (*)    MCH 23.1 (*)    RDW 16.7 (*)    All other components within normal limits  BASIC METABOLIC PANEL - Abnormal; Notable for the following components:   Glucose, Bld 150 (*)    All other components within normal limits  URINE CULTURE    EKG None  Radiology No results found.  Procedures Procedures   Medications Ordered in ED Medications  lidocaine (XYLOCAINE) 2 % jelly 1 application (1 application Topical Given 04/06/21 1015)    ED Course  I have reviewed the triage vital signs and the nursing notes.  Pertinent labs & imaging results that were available during my care of the patient were reviewed by me and considered in my medical decision making (see chart for details).    MDM Rules/Calculators/A&P                          Issues with Foley catheter.  We will use Urojet and try to replace.  Leaking around the catheter through the urethra.  Will check kidney function will check for infection.  Will likely follow-up with outpatient urology.  Foley catheter was placed by nursing, urinalysis shows large leukocyte esterase large white blood cells but no bacteria culture sent and we will start antibiotics and have him follow-up with his urologist.  Strict return precautions discussed.  Blood work shows stable kidney function otherwise unremarkable.  Safe for discharge home patient agrees.  Feels much better now that catheter is in place. Final Clinical Impression(s) / ED Diagnoses Final diagnoses:  Problem with Foley catheter, initial encounter ([place])  Acute cystitis without hematuria    Rx / DC Orders ED Discharge Orders         Ordered    sulfamethoxazole-trimethoprim (BACTRIM DS) 800-160 MG tablet  2 times daily        04/06/21 1054           Breck Coons, MD 04/06/21 1055

## 2021-04-07 ENCOUNTER — Emergency Department (HOSPITAL_COMMUNITY): Payer: Medicare HMO

## 2021-04-07 ENCOUNTER — Encounter (HOSPITAL_COMMUNITY): Payer: Self-pay | Admitting: Emergency Medicine

## 2021-04-07 ENCOUNTER — Other Ambulatory Visit: Payer: Self-pay

## 2021-04-07 ENCOUNTER — Emergency Department (HOSPITAL_COMMUNITY)
Admission: EM | Admit: 2021-04-07 | Discharge: 2021-04-07 | Disposition: A | Discharge status: Home / Self Care | Payer: Medicare HMO | Attending: Emergency Medicine | Admitting: Emergency Medicine

## 2021-04-07 DIAGNOSIS — Z7984 Long term (current) use of oral hypoglycemic drugs: Secondary | ICD-10-CM | POA: Insufficient documentation

## 2021-04-07 DIAGNOSIS — Z79899 Other long term (current) drug therapy: Secondary | ICD-10-CM | POA: Insufficient documentation

## 2021-04-07 DIAGNOSIS — F17210 Nicotine dependence, cigarettes, uncomplicated: Secondary | ICD-10-CM | POA: Insufficient documentation

## 2021-04-07 DIAGNOSIS — N3289 Other specified disorders of bladder: Secondary | ICD-10-CM | POA: Insufficient documentation

## 2021-04-07 DIAGNOSIS — R0602 Shortness of breath: Secondary | ICD-10-CM | POA: Insufficient documentation

## 2021-04-07 DIAGNOSIS — Z7982 Long term (current) use of aspirin: Secondary | ICD-10-CM | POA: Insufficient documentation

## 2021-04-07 DIAGNOSIS — R410 Disorientation, unspecified: Secondary | ICD-10-CM | POA: Diagnosis not present

## 2021-04-07 DIAGNOSIS — I110 Hypertensive heart disease with heart failure: Secondary | ICD-10-CM | POA: Diagnosis not present

## 2021-04-07 DIAGNOSIS — I5032 Chronic diastolic (congestive) heart failure: Secondary | ICD-10-CM | POA: Insufficient documentation

## 2021-04-07 DIAGNOSIS — R1030 Lower abdominal pain, unspecified: Secondary | ICD-10-CM | POA: Diagnosis present

## 2021-04-07 DIAGNOSIS — E119 Type 2 diabetes mellitus without complications: Secondary | ICD-10-CM | POA: Insufficient documentation

## 2021-04-07 LAB — COMPREHENSIVE METABOLIC PANEL
ALT: 51 U/L — ABNORMAL HIGH (ref 0–44)
AST: 46 U/L — ABNORMAL HIGH (ref 15–41)
Albumin: 3.8 g/dL (ref 3.5–5.0)
Alkaline Phosphatase: 57 U/L (ref 38–126)
Anion gap: 11 (ref 5–15)
BUN: 17 mg/dL (ref 8–23)
CO2: 19 mmol/L — ABNORMAL LOW (ref 22–32)
Calcium: 9.6 mg/dL (ref 8.9–10.3)
Chloride: 108 mmol/L (ref 98–111)
Creatinine, Ser: 0.83 mg/dL (ref 0.61–1.24)
GFR, Estimated: >60 mL/min (ref >60)
Glucose, Bld: 246 mg/dL — ABNORMAL HIGH (ref 70–99)
Potassium: 4 mmol/L (ref 3.5–5.1)
Sodium: 138 mmol/L (ref 135–145)
Total Bilirubin: 0.8 mg/dL (ref 0.3–1.2)
Total Protein: 7.5 g/dL (ref 6.5–8.1)

## 2021-04-07 LAB — CBC WITH DIFFERENTIAL/PLATELET
Abs Immature Granulocytes: 0.07 10*3/uL (ref 0.00–0.07)
Basophils Absolute: 0.1 10*3/uL (ref 0.0–0.1)
Basophils Relative: 0 %
Eosinophils Absolute: 0.1 10*3/uL (ref 0.0–0.5)
Eosinophils Relative: 0 %
HCT: 47.1 % (ref 39.0–52.0)
Hemoglobin: 14.1 g/dL (ref 13.0–17.0)
Immature Granulocytes: 1 %
Lymphocytes Relative: 15 %
Lymphs Abs: 2 10*3/uL (ref 0.7–4.0)
MCH: 22.8 pg — ABNORMAL LOW (ref 26.0–34.0)
MCHC: 29.9 g/dL — ABNORMAL LOW (ref 30.0–36.0)
MCV: 76.2 fL — ABNORMAL LOW (ref 80.0–100.0)
Monocytes Absolute: 0.7 10*3/uL (ref 0.1–1.0)
Monocytes Relative: 5 %
Neutro Abs: 10.3 10*3/uL — ABNORMAL HIGH (ref 1.7–7.7)
Neutrophils Relative %: 79 %
Platelets: 346 10*3/uL (ref 150–400)
RBC: 6.18 MIL/uL — ABNORMAL HIGH (ref 4.22–5.81)
RDW: 17.7 % — ABNORMAL HIGH (ref 11.5–15.5)
WBC: 13.1 10*3/uL — ABNORMAL HIGH (ref 4.0–10.5)
nRBC: 0 % (ref 0.0–0.2)

## 2021-04-07 LAB — RAPID URINE DRUG SCREEN, HOSP PERFORMED
Amphetamines: POSITIVE — AB
Barbiturates: NOT DETECTED
Benzodiazepines: NOT DETECTED
Cocaine: NOT DETECTED
Opiates: POSITIVE — AB
Tetrahydrocannabinol: NOT DETECTED

## 2021-04-07 LAB — URINALYSIS, ROUTINE W REFLEX MICROSCOPIC
Bilirubin Urine: NEGATIVE
Glucose, UA: NEGATIVE mg/dL
Hgb urine dipstick: NEGATIVE
Ketones, ur: NEGATIVE mg/dL
Nitrite: NEGATIVE
Protein, ur: NEGATIVE mg/dL
Specific Gravity, Urine: 1.021 (ref 1.005–1.030)
WBC, UA: >50 WBC/hpf — ABNORMAL HIGH (ref 0–5)
pH: 6 (ref 5.0–8.0)

## 2021-04-07 LAB — ETHANOL: Alcohol, Ethyl (B): <10 mg/dL (ref <10)

## 2021-04-07 LAB — URINE CULTURE: Culture: <10000 — AB

## 2021-04-07 MED ORDER — DILTIAZEM HCL ER COATED BEADS 120 MG PO CP24
240.0000 mg | ORAL_CAPSULE | Freq: Every day | ORAL | Status: DC
  Administered 2021-04-07: 240 mg via ORAL
  Filled 2021-04-07: qty 2

## 2021-04-07 MED ORDER — ONDANSETRON 4 MG PO TBDP
8.0000 mg | ORAL_TABLET | Freq: Once | ORAL | Status: AC
  Administered 2021-04-07: 8 mg via ORAL
  Filled 2021-04-07: qty 2

## 2021-04-07 MED ORDER — OXYBUTYNIN CHLORIDE 5 MG PO TABS
5.0000 mg | ORAL_TABLET | Freq: Three times a day (TID) | ORAL | 0 refills | Status: DC | PRN
  Filled 2021-04-07: qty 30, 10d supply, fill #0

## 2021-04-07 MED ORDER — HYDROCODONE-ACETAMINOPHEN 5-325 MG PO TABS: 1.0000 | ORAL_TABLET | ORAL | 0 refills | Status: DC | PRN

## 2021-04-07 MED ORDER — SODIUM CHLORIDE 0.9 % IV BOLUS
500.0000 mL | Freq: Once | INTRAVENOUS | Status: AC
  Administered 2021-04-07: 500 mL via INTRAVENOUS

## 2021-04-07 MED ORDER — OXYCODONE-ACETAMINOPHEN 5-325 MG PO TABS
1.0000 | ORAL_TABLET | Freq: Once | ORAL | Status: AC
  Administered 2021-04-07: 1 via ORAL
  Filled 2021-04-07: qty 1

## 2021-04-07 MED ORDER — BELLADONNA ALKALOIDS-OPIUM 16.2-60 MG RE SUPP
1.0000 | Freq: Once | RECTAL | Status: AC
Start: 2021-04-07 — End: 2021-04-07
  Administered 2021-04-07: 1 via RECTAL
  Filled 2021-04-07 (×2): qty 1

## 2021-04-07 MED ORDER — HYDROCODONE-ACETAMINOPHEN 5-325 MG PO TABS
1.0000 | ORAL_TABLET | ORAL | 0 refills | Status: DC | PRN
  Filled 2021-04-07: qty 15, 3d supply, fill #0

## 2021-04-07 MED ORDER — CEPHALEXIN 250 MG PO CAPS
500.0000 mg | ORAL_CAPSULE | Freq: Once | ORAL | Status: AC
  Administered 2021-04-07: 500 mg via ORAL
  Filled 2021-04-07: qty 2

## 2021-04-07 MED ORDER — AMLODIPINE BESYLATE 5 MG PO TABS
5.0000 mg | ORAL_TABLET | Freq: Once | ORAL | Status: AC
  Administered 2021-04-07: 5 mg via ORAL
  Filled 2021-04-07: qty 1

## 2021-04-07 MED ORDER — ONDANSETRON HCL 4 MG/2ML IJ SOLN
4.0000 mg | Freq: Once | INTRAMUSCULAR | Status: DC
  Filled 2021-04-07: qty 2

## 2021-04-07 NOTE — ED Notes (Signed)
Patient is resting comfortably. 

## 2021-04-07 NOTE — ED Notes (Signed)
Bladder scan 3ml

## 2021-04-07 NOTE — ED Notes (Signed)
Pt was able to ambulate with standby assist from staff

## 2021-04-07 NOTE — ED Provider Notes (Signed)
[facility] EMERGENCY DEPARTMENT Provider Note   CSN: [id number] Arrival date & time: 04/07/21  1052     History Chief Complaint  Patient presents with  . Shortness of Breath  . Abdominal Pain    [name] is a 70 y.o. male.  HPI I saw the patient at 11:26 AM.  At this time he is lying on stretcher, with drool on his face.  He was responsive but seemed confused.  He stated he could not remember why he came here.  He presented by EMS.  As a began to talk to him he became more communicative and stated that he had abdominal pain.  He also told me he had a catheter put in yesterday, and that he has had to have that before.  Required if he had shortness of breath which was an earlier complaint and he stated he did.  He continued to be a somewhat poor historian, want to roll over on his right side and rest.   Level 5 caveat-poor historian    Past Medical History:  Diagnosis Date  . Alcohol dependence ([place]) 12/10/2013  . Angioedema of lips 12/24/2013  . Brain tumor ([place])   . CHF (congestive heart failure) ([facility])    diastolic  . Chronic back pain   . Chronic headache   . CVA (cerebral vascular accident) ([place])   . Diabetes mellitus   . 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[place]) 02/18/2020  . HTN (hypertension), malignant 09/06/2012  . Hypertension   . Narcotic abuse ([place])   . Normal cardiac stress test 2009  . TIA (transient ischemic attack) 09/06/2012  . Tobacco use disorder 12/10/2013  . UTI (urinary tract infection)   . Vertigo 09/06/2012    Patient Active Problem List   Diagnosis Date Noted  . Complicated UTI (urinary tract infection) 02/18/2020  . 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[place]) 02/18/2020  . Epigastric abdominal pain   . Epigastric pain 02/16/2020  . Hypertensive urgency 02/16/2020  . Cardiac murmur 02/16/2020  . Acute pyelonephritis 02/16/2020  . Syncope 01/23/2017  . Elevated troponin   . Nausea 11/01/2016  .  Dysuria 11/01/2016  . Chronic diastolic heart failure ([place]) 03/20/2015  . Chest pain 03/18/2015  . History of TIA (transient ischemic attack) 01/27/2015  . Enterococcus UTI 01/27/2015  . Cough 01/27/2015  . Bronchitis 01/27/2015  . HCAP (healthcare-associated pneumonia)   . CHF (congestive heart failure) ([place]) 01/26/2015  . History of alcohol abuse 01/26/2015  . Pyelonephritis 01/12/2015  . Prostatitis 01/12/2015  . Essential hypertension 11/03/2014  . Hypokalemia 11/03/2014  . Fever 08/15/2014  . Chronic pain 12/24/2013  . Tobacco use disorder 12/10/2013  . Leukocytosis 12/10/2013  . Diabetes mellitus type 2, controlled ([place]) 09/06/2012  . Pain in the side 09/06/2012  . Pituitary adenoma ([place]) 09/06/2012    Past Surgical History:  Procedure Laterality Date  . NO PAST SURGERIES         Family History  Problem Relation Age of Onset  . CAD Father        "heart attack in his 43's"  . Hypertension Father   . Diabetes Brother   . Diabetes Sister   . Hypertension Mother     Social History   Tobacco Use  . Smoking status: Current Every Day Smoker    Packs/day: 0.50    Types: Cigarettes  . Smokeless tobacco: Current User  Vaping Use  . Vaping Use: Never used  Substance Use Topics  . Alcohol use: Yes  .  Drug use: Yes    Types: Marijuana, Cocaine, IV    Comment: Heroin - one month ago    Home Medications Prior to Admission medications   Medication Sig Start Date End Date Taking? Authorizing Provider  albuterol (PROVENTIL HFA;VENTOLIN HFA) 108 (90 Base) MCG/ACT inhaler Inhale 1-2 puffs into the lungs every 6 (six) hours as needed for wheezing. 09/04/16   Tanna Furry, MD  amLODipine (NORVASC) 10 MG tablet Take 10 mg by mouth daily.    [provider]  aspirin EC 81 MG tablet Take 81 mg by mouth daily.    [provider]  butalbital-acetaminophen-caffeine (FIORICET) 50-325-40 MG tablet TAKE 1 TABLET BY MOUTH EVERY 6 HOURS AS NEEDED FOR HEADACHES  10/04/20 10/04/21  Nolene Ebbs, MD  cephALEXin (KEFLEX) 500 MG capsule TAKE 1 CAPSULE (500 MG TOTAL) BY MOUTH 4 (FOUR) TIMES DAILY. 02/06/21 02/06/22  Lacretia Leigh, MD  cyclobenzaprine (FLEXERIL) 10 MG tablet Take 1 tablet (10 mg total) by mouth 2 (two) times daily as needed for muscle spasms. Patient not taking: No sig reported 03/08/20   Ripley Fraise, MD  diltiazem (CARDIZEM CD) 240 MG 24 hr capsule Take 1 capsule (240 mg total) by mouth daily. 02/21/20   Eugenie Filler, MD  docusate sodium (COLACE) 100 MG capsule Take 1 capsule (100 mg total) by mouth every 12 (twelve) hours. Patient taking differently: Take 100 mg by mouth daily. 01/02/18   Little, Wenda Overland, MD  folic acid (FOLVITE) 1 MG tablet Take 1 tablet (1 mg total) by mouth daily. 02/21/20   Eugenie Filler, MD  gabapentin (NEURONTIN) 100 MG capsule Take 100 mg by mouth 2 (two) times daily. 06/10/16   [provider]  hydrALAZINE (APRESOLINE) 100 MG tablet Take 100 mg by mouth 3 (three) times daily. 07/08/20   [provider]  hydrALAZINE (APRESOLINE) 25 MG tablet Take 3 tablets (75 mg total) by mouth every 8 (eight) hours. Patient not taking: No sig reported 02/20/20   Eugenie Filler, MD  hydrochlorothiazide (MICROZIDE) 12.5 MG capsule Take 1 capsule (12.5 mg total) by mouth daily. Patient not taking: No sig reported 02/20/20   Eugenie Filler, MD  HYDROcodone-acetaminophen (NORCO/VICODIN) 5-325 MG tablet TAKE 1 TABLET BY MOUTH EVERY 4 HOURS AS NEEDED. Patient not taking: No sig reported 12/13/20 06/11/21  Isla Pence, MD  ibuprofen (ADVIL,MOTRIN) 800 MG tablet Take 0.5 tablets (400 mg total) by mouth every 6 (six) hours as needed (pain). Patient taking differently: Take 800 mg by mouth 3 (three) times daily. 06/16/18   Fawze, Mina A, PA-C  losartan-hydrochlorothiazide (HYZAAR) 100-12.5 MG tablet Take 1 tablet by mouth daily. 01/02/21   [provider]  metFORMIN (GLUCOPHAGE) 500 MG tablet Take  500 mg by mouth 2 (two) times daily with a meal.    [provider]  Multiple Vitamin (MULTIVITAMIN WITH MINERALS) TABS tablet Take 1 tablet by mouth daily. Patient not taking: No sig reported 02/21/20   Eugenie Filler, MD  nicotine (NICODERM CQ - DOSED IN MG/24 HOURS) 21 mg/24hr patch Place 1 patch (21 mg total) onto the skin daily. 02/21/20   Eugenie Filler, MD  ondansetron (ZOFRAN-ODT) 4 MG disintegrating tablet TAKE 1 TABLET (4 MG TOTAL) BY MOUTH EVERY 8 (EIGHT) HOURS AS NEEDED FOR NAUSEA OR VOMITING. 12/13/20 12/13/21  Isla Pence, MD  pantoprazole (PROTONIX) 40 MG tablet Take 1 tablet (40 mg total) by mouth daily. Patient not taking: No sig reported 02/20/20   Eugenie Filler, MD  phenazopyridine (PYRIDIUM) 200 MG tablet Take 1 tablet (200 mg total) by mouth 3 (three) times daily. Patient not taking: No sig reported 12/13/20   Isla Pence, MD  sucralfate (CARAFATE) 1 GM/10ML suspension Take 10 mLs (1 g total) by mouth 4 (four) times daily -  with meals and at bedtime. 02/20/20   Eugenie Filler, MD  sulfamethoxazole-trimethoprim (BACTRIM DS) 800-160 MG tablet Take 1 tablet by mouth 2 (two) times daily for 7 days. 04/06/21 04/13/21  Breck Coons, MD  tamsulosin (FLOMAX) 0.4 MG CAPS capsule TAKE 1 CAPSULE BY MOUTH TWICE DAILY 02/14/21 02/14/22  Hollace Hayward, NP  thiamine 100 MG tablet Take 1 tablet (100 mg total) by mouth daily. 02/21/20   Eugenie Filler, MD  traMADol (ULTRAM) 50 MG tablet Take 50 mg by mouth 2 (two) times daily as needed for severe pain. 10/05/20   [provider]  Vitamin D, Ergocalciferol, (DRISDOL) 1.25 MG (50000 UNIT) CAPS capsule Take 50,000 Units by mouth once a week. 02/14/20   [provider]    Allergies    Patient has no known allergies.  Review of Systems   Review of Systems  Unable to perform ROS: Other    Physical Exam Updated Vital Signs BP (!) 180/60 (BP Location: Right Arm)   Pulse 76   Temp 98.6 F (37  C) (Oral)   Resp 18   Ht 5\' 9"  (1.753 m)   Wt 71.7 kg   SpO2 94%   BMI 23.33 kg/m   Physical Exam Vitals and nursing note reviewed.  Constitutional:      General: He is not in acute distress.    Appearance: He is well-developed. He is ill-appearing. He is not toxic-appearing or diaphoretic.  HENT:     Head: Normocephalic and atraumatic.     Right Ear: External ear normal.     Left Ear: External ear normal.     Mouth/Throat:     Pharynx: No oropharyngeal exudate or posterior oropharyngeal erythema.  Eyes:     Conjunctiva/sclera: Conjunctivae normal.     Pupils: Pupils are equal, round, and reactive to light.  Neck:     Trachea: Phonation normal.  Cardiovascular:     Rate and Rhythm: Normal rate and regular rhythm.     Heart sounds: Normal heart sounds.  Pulmonary:     Effort: Pulmonary effort is normal.     Breath sounds: Normal breath sounds.  Abdominal:     General: There is no distension.     Palpations: Abdomen is soft. There is no mass.     Tenderness: There is abdominal tenderness (Mid lower, moderate). There is no rebound.     Hernia: No hernia is present.  Musculoskeletal:        General: Normal range of motion.     Cervical back: Normal range of motion and neck supple.  Skin:    General: Skin is warm and dry.  Neurological:     Mental Status: He is alert and oriented to person, place, and time.     Cranial Nerves: No cranial nerve deficit.     Sensory: No sensory deficit.     Motor: No abnormal muscle tone.     Coordination: Coordination normal.  Psychiatric:        Attention and Perception: He is inattentive.        Speech: Speech is delayed.        Behavior: Behavior is slowed.  Thought Content: Thought content is not paranoid. Thought content does not include suicidal ideation.        Cognition and Memory: Cognition is impaired.        Judgment: Judgment is inappropriate.     ED Results / Procedures / Treatments   Labs (all labs ordered are  listed, but only abnormal results are displayed) Labs Reviewed  COMPREHENSIVE METABOLIC PANEL - Abnormal; Notable for the following components:      Result Value   CO2 19 (*)    Glucose, Bld 246 (*)    AST 46 (*)    ALT 51 (*)    All other components within normal limits  CBC WITH DIFFERENTIAL/PLATELET - Abnormal; Notable for the following components:   WBC 13.1 (*)    RBC 6.18 (*)    MCV 76.2 (*)    MCH 22.8 (*)    MCHC 29.9 (*)    RDW 17.7 (*)    Neutro Abs 10.3 (*)    All other components within normal limits  URINALYSIS, ROUTINE W REFLEX MICROSCOPIC - Abnormal; Notable for the following components:   Color, Urine AMBER (*)    APPearance HAZY (*)    Leukocytes,Ua LARGE (*)    WBC, UA >50 (*)    Bacteria, UA RARE (*)    All other components within normal limits  RAPID URINE DRUG SCREEN, HOSP PERFORMED - Abnormal; Notable for the following components:   Opiates POSITIVE (*)    Amphetamines POSITIVE (*)    All other components within normal limits  ETHANOL    EKG EKG Interpretation  Date/Time:  Sunday April 07 2021 11:00:01 EDT Ventricular Rate:  68 PR Interval:  154 QRS Duration: 90 QT Interval:  450 QTC Calculation: 478 R Axis:   -19 Text Interpretation: Normal sinus rhythm Possible Left atrial enlargement Septal infarct , age undetermined Abnormal ECG since last tracing no significant change Confirmed by Daleen Bo [phone]) on 04/07/2021 11:08:01 AM   Radiology DG Chest 2 View  Result Date: 04/07/2021 CLINICAL DATA:  Cough.  Diabetes mellitus. EXAM: CHEST - 2 VIEW COMPARISON:  December 13, 2020 FINDINGS: Lungs are clear. Heart size and pulmonary vascularity are normal. No adenopathy. No pneumothorax. No bone lesions. IMPRESSION: Lungs clear.  Cardiac silhouette normal. Electronically Signed   By: Lowella Grip III M.D.   On: 04/07/2021 14:04    Procedures .Critical Care Performed by: Daleen Bo, MD Authorized by: Daleen Bo, MD   Critical care  provider statement:    Critical care time (minutes):  35   Critical care start time:  04/07/2021 3:30 PM   Critical care end time:  04/07/2021 10:15 PM   Critical care time was exclusive of:  Separately billable procedures and treating other patients   Critical care was time spent personally by me on the following activities:  Blood draw for specimens, development of treatment plan with patient or surrogate, discussions with consultants, evaluation of patient's response to treatment, examination of patient, obtaining history from patient or surrogate, ordering and performing treatments and interventions, ordering and review of laboratory studies, pulse oximetry, re-evaluation of patient's condition, review of old charts and ordering and review of radiographic studies     Medications Ordered in ED Medications  sodium chloride 0.9 % bolus 500 mL (0 mLs Intravenous Hold 04/07/21 1421)  ondansetron (ZOFRAN) injection 4 mg (0 mg Intravenous Hold 04/07/21 1419)  cephALEXin (KEFLEX) capsule 500 mg (has no administration in time range)  oxyCODONE-acetaminophen (PERCOCET/ROXICET) 5-325 MG per  tablet 1 tablet (has no administration in time range)  amLODipine (NORVASC) tablet 5 mg (has no administration in time range)  diltiazem (CARDIZEM CD) 24 hr capsule 240 mg (has no administration in time range)    ED Course  I have reviewed the triage vital signs and the nursing notes.  Pertinent labs & imaging results that were available during my care of the patient were reviewed by me and considered in my medical decision making (see chart for details).  Clinical Course as of 04/08/21 1130  Sun Apr 07, 2021  1515 He continues complain of abdominal pain and feels like he cannot go home at this time.  He states he did not take all his medicines today.  He states he took hydralazine but none of the other antihypertensives. [EW]  1857 I discussed case with Dr. Gloriann Loan, urologist.  He states that 3 days ago the patient  had a cystoscopy, and prostate ultrasound.  There were no complications.  He did not have biopsies.  Patient has a large diverticulum.  Dr. Gloriann Loan reviewed the CT scan and stated that there were no apparent complication from the indwelling Foley catheter.  He suggested a BNO suppository, oxybutynin for bladder spasm, and office follow-up. [EW]    Clinical Course User Index [EW] Daleen Bo, MD   MDM Rules/Calculators/A&P                           Patient Vitals for the past 24 hrs:  BP Temp Temp src Pulse Resp SpO2 Height Weight  04/07/21 1437 (!) 180/60 98.6 F (37 C) Oral 76 18 94 % -- --  04/07/21 1330 (!) 206/50 -- -- 77 18 92 % -- --  04/07/21 1303 -- -- -- -- -- -- 5\' 9"  (1.753 m) 71.7 kg  04/07/21 1228 (!) 180/60 -- -- 76 18 99 % -- --  04/07/21 1215 (!) 187/53 -- -- -- -- -- -- --  04/07/21 1056 (!) 142/49 98.7 F (37.1 C) -- 74 18 96 % -- --    At time of discharge- reevaluation with update and discussion. After initial assessment and treatment, an updated evaluation reveals he is comfortable with relieved pain, and ready to go home.  Findings discussed and questions answered. Daleen Bo   Medical Decision Making:  This patient is presenting for evaluation of varying complaints, which does require a range of treatment options, and is a complaint that involves a high risk of morbidity and mortality. The differential diagnoses include acute illness, metabolic disorder, complications from Foley catheter placement, and appropriate use of medications or intoxicants. I decided to review old records, and in summary elderly male presenting with nonspecific symptoms, requiring evaluation in the ED.  He was seen yesterday and had a Foley catheter exchange after being evaluated for leaking around his Foley catheter.  He was given a prescription for Septra, and referred to urology.  I did not require additional historical information from anyone.  Clinical Laboratory Tests Ordered,  included CBC, Metabolic panel, Urinalysis and Alcohol level.  UDS.  Review indicates CO2 level, glucose high, AST high, ALT high, UDS with opiates and amphetamines, urine with blood present both white and red cells, white count high. Radiologic Tests Ordered, included CT abdomen pelvis.  I independently Visualized: Radiographic images, which show no obstruction, urinary bladder diverticulum  Cardiac Monitor Tracing which shows normal sinus rhythm   Critical Interventions-clinical evaluation, laboratory testing, radiography, medication treatment including pain relief  and blood pressure medications, observation, discussion with urology, reassessment  After These Interventions, the Patient was reevaluated and was found patient improved and stable for discharge.  Abdominal pain likely secondary to bladder spasm, controlled with multiple medications and IV fluids.  Patient comfortable at discharge.  Urine culture from prior ED visit is negative.  Urinalysis today is not specifically evident for infection, therefore will not continue antibiotics at this time.  He has a chronic Foley catheter.  I suspect this is why the urinalysis is abnormal.  Doubt sepsis, metabolic instability or impending vascular collapse.  Patient much more comfortable after treatment and and is ready to go home.  Incidental hypertension is likely related to painful condition and not taking his medications.  Doubt hypertensive urgency.  CRITICAL CARE-yes Performed by: Daleen Bo  Nursing Notes Reviewed/ Care Coordinated Applicable Imaging Reviewed Interpretation of Laboratory Data incorporated into ED treatment  The patient appears reasonably screened and/or stabilized for discharge and I doubt any other medical condition or other [facility] requiring further screening, evaluation, or treatment in the ED at this time prior to discharge.  Plan: Home Medications-continue usual; Home Treatments-rest, fluids, gradual increase activity;  return here if the recommended treatment, does not improve the symptoms; Recommended follow up-urology follow-up 3 to 5 days.    Final Clinical Impression(s) / ED Diagnoses Final diagnoses:  Bladder spasm    Rx / DC Orders ED Discharge Orders         Ordered    oxybutynin (DITROPAN) 5 MG tablet  Every 8 hours PRN        04/07/21 2114    HYDROcodone-acetaminophen (NORCO) 5-325 MG tablet  Every 4 hours PRN,   Status:  Discontinued        04/07/21 2114    HYDROcodone-acetaminophen (NORCO) 5-325 MG tablet  Every 4 hours PRN        04/07/21 2116           Daleen Bo, MD 04/08/21 1138

## 2021-04-07 NOTE — ED Triage Notes (Addendum)
Pt to triage via [facility] from home.  C/o SOB since last night.  Productive cough with green phlegm.  1 episode of vomiting phlegm with EMS.  Foley placed yesterday in ED.  Reports increased abd pain.

## 2021-04-07 NOTE — Discharge Instructions (Signed)
Call your urologist for follow-up appointment next week.  We are adding 2 medications to your treatment to help your discomfort.  1 is a medicine for bladder spasm and the other is a pain reliever.  Make sure you are taking all your other medicines as directed.  You do not need to take the antibiotic that was prescribed yesterday because your urine culture was negative, meaning it did not show an infection.

## 2021-04-08 ENCOUNTER — Other Ambulatory Visit (HOSPITAL_COMMUNITY): Payer: Self-pay

## 2021-05-06 ENCOUNTER — Other Ambulatory Visit (HOSPITAL_COMMUNITY): Payer: Self-pay

## 2021-05-06 MED ORDER — CIPROFLOXACIN HCL 500 MG PO TABS
ORAL_TABLET | ORAL | 0 refills | Status: DC
  Filled 2021-05-06: qty 14, 7d supply, fill #0

## 2021-05-08 ENCOUNTER — Other Ambulatory Visit (HOSPITAL_COMMUNITY): Payer: Self-pay

## 2021-05-08 MED ORDER — TAMSULOSIN HCL 0.4 MG PO CAPS
0.8000 mg | ORAL_CAPSULE | Freq: Every day | ORAL | 11 refills | Status: DC
  Filled 2021-05-08: qty 60, 30d supply, fill #0

## 2021-05-13 ENCOUNTER — Other Ambulatory Visit (HOSPITAL_COMMUNITY): Payer: Self-pay

## 2021-06-04 ENCOUNTER — Other Ambulatory Visit (HOSPITAL_COMMUNITY): Payer: Self-pay

## 2021-06-04 ENCOUNTER — Other Ambulatory Visit: Payer: Self-pay | Admitting: Internal Medicine

## 2021-06-04 MED ORDER — TRAMADOL HCL 50 MG PO TABS
ORAL_TABLET | ORAL | 0 refills | Status: DC
  Filled 2021-06-04: qty 10, 5d supply, fill #0

## 2021-06-04 MED ORDER — METFORMIN HCL 500 MG PO TABS
ORAL_TABLET | ORAL | 2 refills | Status: DC
  Filled 2021-06-04: qty 180, 90d supply, fill #0

## 2021-06-04 MED ORDER — FARXIGA 5 MG PO TABS
ORAL_TABLET | Freq: Every day | ORAL | 2 refills | Status: DC
  Filled 2021-06-04: qty 90, 90d supply, fill #0

## 2021-06-04 MED ORDER — VITAMIN D (ERGOCALCIFEROL) 50000 UNITS PO CAPS
ORAL_CAPSULE | ORAL | 5 refills | Status: DC
  Filled 2021-06-04: qty 4, 28d supply, fill #0
  Filled 2021-06-26: qty 4, 28d supply, fill #1

## 2021-06-04 MED ORDER — TIZANIDINE HCL 4 MG PO TABS
ORAL_TABLET | ORAL | 2 refills | Status: DC
  Filled 2021-06-04: qty 60, 30d supply, fill #0

## 2021-06-05 ENCOUNTER — Other Ambulatory Visit (HOSPITAL_COMMUNITY): Payer: Self-pay

## 2021-06-05 LAB — CBC
HCT: 43.9 % (ref 38.5–50.0)
Hemoglobin: 13.5 g/dL (ref 13.2–17.1)
MCH: 23.2 pg — ABNORMAL LOW (ref 27.0–33.0)
MCHC: 30.8 g/dL — ABNORMAL LOW (ref 32.0–36.0)
MCV: 75.6 fL — ABNORMAL LOW (ref 80.0–100.0)
MPV: 10.5 fL (ref 7.5–12.5)
Platelets: 270 10*3/uL (ref 140–400)
RBC: 5.81 10*6/uL — ABNORMAL HIGH (ref 4.20–5.80)
RDW: 14.5 % (ref 11.0–15.0)
WBC: 10.5 10*3/uL (ref 3.8–10.8)

## 2021-06-05 LAB — COMPLETE METABOLIC PANEL WITH GFR
AG Ratio: 1.1 (calc) (ref 1.0–2.5)
ALT: 42 U/L (ref 9–46)
AST: 48 U/L — ABNORMAL HIGH (ref 10–35)
Albumin: 3.9 g/dL (ref 3.6–5.1)
Alkaline phosphatase (APISO): 74 U/L (ref 35–144)
BUN: 18 mg/dL (ref 7–25)
CO2: 26 mmol/L (ref 20–32)
Calcium: 9.6 mg/dL (ref 8.6–10.3)
Chloride: 101 mmol/L (ref 98–110)
Creat: 0.96 mg/dL (ref 0.70–1.25)
GFR, Est African American: 94 mL/min/{1.73_m2} (ref >=60)
GFR, Est Non African American: 81 mL/min/{1.73_m2} (ref >=60)
Globulin: 3.7 g/dL (calc) (ref 1.9–3.7)
Glucose, Bld: 194 mg/dL — ABNORMAL HIGH (ref 65–99)
Potassium: 4.7 mmol/L (ref 3.5–5.3)
Sodium: 138 mmol/L (ref 135–146)
Total Bilirubin: 0.6 mg/dL (ref 0.2–1.2)
Total Protein: 7.6 g/dL (ref 6.1–8.1)

## 2021-06-05 LAB — VITAMIN D 25 HYDROXY (VIT D DEFICIENCY, FRACTURES): Vit D, 25-Hydroxy: 36 ng/mL (ref 30–100)

## 2021-06-13 ENCOUNTER — Other Ambulatory Visit (HOSPITAL_COMMUNITY): Payer: Self-pay

## 2021-06-14 ENCOUNTER — Other Ambulatory Visit (HOSPITAL_COMMUNITY): Payer: Self-pay

## 2021-06-26 ENCOUNTER — Other Ambulatory Visit (HOSPITAL_COMMUNITY): Payer: Self-pay

## 2021-06-27 ENCOUNTER — Other Ambulatory Visit (HOSPITAL_COMMUNITY): Payer: Self-pay

## 2021-07-02 ENCOUNTER — Other Ambulatory Visit (HOSPITAL_COMMUNITY): Payer: Self-pay

## 2021-07-03 ENCOUNTER — Other Ambulatory Visit (HOSPITAL_COMMUNITY): Payer: Self-pay

## 2021-07-08 ENCOUNTER — Other Ambulatory Visit (HOSPITAL_COMMUNITY): Payer: Self-pay

## 2021-09-25 ENCOUNTER — Other Ambulatory Visit (HOSPITAL_COMMUNITY): Payer: Self-pay

## 2021-10-17 ENCOUNTER — Encounter (HOSPITAL_COMMUNITY): Payer: Self-pay

## 2021-11-26 ENCOUNTER — Other Ambulatory Visit: Payer: Self-pay | Admitting: Internal Medicine

## 2021-11-27 LAB — URINE CULTURE
MICRO NUMBER:: 12690116
Result:: NO GROWTH
SPECIMEN QUALITY:: ADEQUATE

## 2022-03-22 ENCOUNTER — Other Ambulatory Visit: Payer: Self-pay

## 2022-03-22 ENCOUNTER — Inpatient Hospital Stay (HOSPITAL_COMMUNITY)
Admission: EM | Admit: 2022-03-22 | Discharge: 2022-03-25 | DRG: 690 | Disposition: A | Discharge status: Home / Self Care | Payer: Medicare HMO | Attending: Internal Medicine | Admitting: Internal Medicine

## 2022-03-22 ENCOUNTER — Observation Stay (HOSPITAL_COMMUNITY): Payer: Medicare HMO

## 2022-03-22 ENCOUNTER — Encounter (HOSPITAL_COMMUNITY): Payer: Self-pay

## 2022-03-22 DIAGNOSIS — I160 Hypertensive urgency: Secondary | ICD-10-CM

## 2022-03-22 DIAGNOSIS — F17210 Nicotine dependence, cigarettes, uncomplicated: Secondary | ICD-10-CM | POA: Diagnosis present

## 2022-03-22 DIAGNOSIS — I5032 Chronic diastolic (congestive) heart failure: Secondary | ICD-10-CM | POA: Diagnosis present

## 2022-03-22 DIAGNOSIS — R339 Retention of urine, unspecified: Principal | ICD-10-CM

## 2022-03-22 DIAGNOSIS — I639 Cerebral infarction, unspecified: Secondary | ICD-10-CM

## 2022-03-22 DIAGNOSIS — Z7982 Long term (current) use of aspirin: Secondary | ICD-10-CM

## 2022-03-22 DIAGNOSIS — I10 Essential (primary) hypertension: Secondary | ICD-10-CM

## 2022-03-22 DIAGNOSIS — Z79899 Other long term (current) drug therapy: Secondary | ICD-10-CM

## 2022-03-22 DIAGNOSIS — N390 Urinary tract infection, site not specified: Secondary | ICD-10-CM | POA: Diagnosis not present

## 2022-03-22 DIAGNOSIS — E871 Hypo-osmolality and hyponatremia: Secondary | ICD-10-CM

## 2022-03-22 DIAGNOSIS — Z7984 Long term (current) use of oral hypoglycemic drugs: Secondary | ICD-10-CM

## 2022-03-22 DIAGNOSIS — E119 Type 2 diabetes mellitus without complications: Secondary | ICD-10-CM

## 2022-03-22 DIAGNOSIS — N323 Diverticulum of bladder: Secondary | ICD-10-CM | POA: Diagnosis present

## 2022-03-22 DIAGNOSIS — B952 Enterococcus as the cause of diseases classified elsewhere: Secondary | ICD-10-CM | POA: Diagnosis present

## 2022-03-22 DIAGNOSIS — Z8249 Family history of ischemic heart disease and other diseases of the circulatory system: Secondary | ICD-10-CM

## 2022-03-22 DIAGNOSIS — N179 Acute kidney failure, unspecified: Secondary | ICD-10-CM

## 2022-03-22 DIAGNOSIS — I110 Hypertensive heart disease with heart failure: Secondary | ICD-10-CM | POA: Diagnosis present

## 2022-03-22 DIAGNOSIS — E876 Hypokalemia: Secondary | ICD-10-CM | POA: Diagnosis present

## 2022-03-22 DIAGNOSIS — E861 Hypovolemia: Secondary | ICD-10-CM | POA: Diagnosis present

## 2022-03-22 DIAGNOSIS — Z8673 Personal history of transient ischemic attack (TIA), and cerebral infarction without residual deficits: Secondary | ICD-10-CM

## 2022-03-22 DIAGNOSIS — Z833 Family history of diabetes mellitus: Secondary | ICD-10-CM

## 2022-03-22 LAB — HEMOGLOBIN A1C
Hgb A1c MFr Bld: 6.8 % — ABNORMAL HIGH (ref 4.8–5.6)
Mean Plasma Glucose: 148.46 mg/dL

## 2022-03-22 LAB — CBC
HCT: 39.2 % (ref 39.0–52.0)
Hemoglobin: 12.6 g/dL — ABNORMAL LOW (ref 13.0–17.0)
MCH: 23.9 pg — ABNORMAL LOW (ref 26.0–34.0)
MCHC: 32.1 g/dL (ref 30.0–36.0)
MCV: 74.2 fL — ABNORMAL LOW (ref 80.0–100.0)
Platelets: 264 10*3/uL (ref 150–400)
RBC: 5.28 MIL/uL (ref 4.22–5.81)
RDW: 14.6 % (ref 11.5–15.5)
WBC: 15.8 10*3/uL — ABNORMAL HIGH (ref 4.0–10.5)
nRBC: 0 % (ref 0.0–0.2)

## 2022-03-22 LAB — URINALYSIS, ROUTINE W REFLEX MICROSCOPIC
Bilirubin Urine: NEGATIVE
Glucose, UA: NEGATIVE mg/dL
Ketones, ur: NEGATIVE mg/dL
Nitrite: NEGATIVE
Protein, ur: 30 mg/dL — AB
Specific Gravity, Urine: <1.005 — ABNORMAL LOW (ref 1.005–1.030)
pH: 7.5 (ref 5.0–8.0)

## 2022-03-22 LAB — BASIC METABOLIC PANEL
Anion gap: 11 (ref 5–15)
BUN: 32 mg/dL — ABNORMAL HIGH (ref 8–23)
CO2: 17 mmol/L — ABNORMAL LOW (ref 22–32)
Calcium: 9.4 mg/dL (ref 8.9–10.3)
Chloride: 104 mmol/L (ref 98–111)
Creatinine, Ser: 1.51 mg/dL — ABNORMAL HIGH (ref 0.61–1.24)
GFR, Estimated: 50 mL/min — ABNORMAL LOW (ref >60)
Glucose, Bld: 144 mg/dL — ABNORMAL HIGH (ref 70–99)
Potassium: 4.2 mmol/L (ref 3.5–5.1)
Sodium: 132 mmol/L — ABNORMAL LOW (ref 135–145)

## 2022-03-22 LAB — URINALYSIS, MICROSCOPIC (REFLEX)

## 2022-03-22 LAB — LACTIC ACID, PLASMA
Lactic Acid, Venous: 1 mmol/L (ref 0.5–1.9)
Lactic Acid, Venous: 0.9 mmol/L (ref 0.5–1.9)

## 2022-03-22 LAB — RAPID URINE DRUG SCREEN, HOSP PERFORMED
Amphetamines: NOT DETECTED
Barbiturates: NOT DETECTED
Benzodiazepines: NOT DETECTED
Cocaine: NOT DETECTED
Opiates: POSITIVE — AB
Tetrahydrocannabinol: NOT DETECTED

## 2022-03-22 LAB — GLUCOSE, CAPILLARY
Glucose-Capillary: 178 mg/dL — ABNORMAL HIGH (ref 70–99)
Glucose-Capillary: 168 mg/dL — ABNORMAL HIGH (ref 70–99)

## 2022-03-22 MED ORDER — ONDANSETRON HCL 4 MG PO TABS: 4.0000 mg | ORAL_TABLET | Freq: Four times a day (QID) | ORAL | Status: DC | PRN

## 2022-03-22 MED ORDER — HYDROCHLOROTHIAZIDE 12.5 MG PO CAPS
12.5000 mg | ORAL_CAPSULE | Freq: Once | ORAL | Status: DC
Start: 2022-03-22 — End: 2022-03-22
  Filled 2022-03-22: qty 1

## 2022-03-22 MED ORDER — ASPIRIN EC 81 MG PO TBEC
81.0000 mg | DELAYED_RELEASE_TABLET | Freq: Every day | ORAL | Status: DC
  Administered 2022-03-22 – 2022-03-25 (×4): 81 mg via ORAL
  Filled 2022-03-22 (×4): qty 1

## 2022-03-22 MED ORDER — ALBUTEROL SULFATE (2.5 MG/3ML) 0.083% IN NEBU: 2.5000 mg | INHALATION_SOLUTION | Freq: Four times a day (QID) | RESPIRATORY_TRACT | Status: DC | PRN

## 2022-03-22 MED ORDER — HYDRALAZINE HCL 20 MG/ML IJ SOLN
20.0000 mg | Freq: Four times a day (QID) | INTRAMUSCULAR | Status: DC | PRN
  Administered 2022-03-22: 20 mg via INTRAVENOUS
  Filled 2022-03-22: qty 1

## 2022-03-22 MED ORDER — HYDRALAZINE HCL 50 MG PO TABS
100.0000 mg | ORAL_TABLET | Freq: Three times a day (TID) | ORAL | Status: DC
  Administered 2022-03-22 – 2022-03-25 (×8): 100 mg via ORAL
  Filled 2022-03-22 (×10): qty 2

## 2022-03-22 MED ORDER — AMLODIPINE BESYLATE 5 MG PO TABS
10.0000 mg | ORAL_TABLET | Freq: Once | ORAL | Status: AC
  Administered 2022-03-22: 10 mg via ORAL
  Filled 2022-03-22: qty 2

## 2022-03-22 MED ORDER — METOCLOPRAMIDE HCL 5 MG/ML IJ SOLN
5.0000 mg | Freq: Four times a day (QID) | INTRAMUSCULAR | Status: DC | PRN
  Administered 2022-03-22 (×2): 5 mg via INTRAVENOUS
  Filled 2022-03-22 (×2): qty 2

## 2022-03-22 MED ORDER — LACTATED RINGERS IV SOLN: INTRAVENOUS | Status: DC

## 2022-03-22 MED ORDER — INSULIN ASPART 100 UNIT/ML IJ SOLN
0.0000 [IU] | Freq: Three times a day (TID) | INTRAMUSCULAR | Status: DC
  Administered 2022-03-22 – 2022-03-23 (×2): 2 [IU] via SUBCUTANEOUS
  Administered 2022-03-23: 5 [IU] via SUBCUTANEOUS
  Administered 2022-03-23: 2 [IU] via SUBCUTANEOUS
  Administered 2022-03-24 – 2022-03-25 (×2): 1 [IU] via SUBCUTANEOUS

## 2022-03-22 MED ORDER — ACETAMINOPHEN 325 MG PO TABS
650.0000 mg | ORAL_TABLET | Freq: Once | ORAL | Status: AC
Start: 2022-03-22 — End: 2022-03-22
  Administered 2022-03-22: 650 mg via ORAL
  Filled 2022-03-22: qty 2

## 2022-03-22 MED ORDER — PANTOPRAZOLE SODIUM 40 MG PO TBEC: 40.0000 mg | DELAYED_RELEASE_TABLET | Freq: Every day | ORAL | Status: DC

## 2022-03-22 MED ORDER — SODIUM CHLORIDE 0.9 % IV SOLN
1.0000 g | Freq: Once | INTRAVENOUS | Status: AC
  Administered 2022-03-22: 1 g via INTRAVENOUS
  Filled 2022-03-22: qty 10

## 2022-03-22 MED ORDER — LABETALOL HCL 5 MG/ML IV SOLN: 5.0000 mg | INTRAVENOUS | Status: DC | PRN

## 2022-03-22 MED ORDER — HYDRALAZINE HCL 100 MG PO TABS: 100.0000 mg | ORAL_TABLET | Freq: Three times a day (TID) | ORAL | Status: DC

## 2022-03-22 MED ORDER — TAMSULOSIN HCL 0.4 MG PO CAPS: 0.8000 mg | ORAL_CAPSULE | Freq: Every day | ORAL | Status: DC

## 2022-03-22 MED ORDER — PANTOPRAZOLE SODIUM 40 MG IV SOLR
40.0000 mg | Freq: Two times a day (BID) | INTRAVENOUS | Status: DC
  Administered 2022-03-22 – 2022-03-23 (×3): 40 mg via INTRAVENOUS
  Filled 2022-03-22 (×3): qty 10

## 2022-03-22 MED ORDER — NICOTINE 21 MG/24HR TD PT24
21.0000 mg | MEDICATED_PATCH | Freq: Every day | TRANSDERMAL | Status: DC
  Administered 2022-03-22 – 2022-03-24 (×3): 21 mg via TRANSDERMAL
  Filled 2022-03-22 (×4): qty 1

## 2022-03-22 MED ORDER — DOXAZOSIN MESYLATE 4 MG PO TABS
4.0000 mg | ORAL_TABLET | Freq: Every day | ORAL | Status: DC
  Administered 2022-03-22 – 2022-03-25 (×4): 4 mg via ORAL
  Filled 2022-03-22 (×4): qty 1

## 2022-03-22 MED ORDER — OXYBUTYNIN CHLORIDE 5 MG PO TABS: 5.0000 mg | ORAL_TABLET | Freq: Three times a day (TID) | ORAL | Status: DC | PRN

## 2022-03-22 MED ORDER — AMLODIPINE BESYLATE 10 MG PO TABS
10.0000 mg | ORAL_TABLET | Freq: Every day | ORAL | Status: DC
  Administered 2022-03-23 – 2022-03-25 (×3): 10 mg via ORAL
  Filled 2022-03-22 (×3): qty 1

## 2022-03-22 MED ORDER — ACETAMINOPHEN 325 MG PO TABS
650.0000 mg | ORAL_TABLET | Freq: Four times a day (QID) | ORAL | Status: DC | PRN
  Administered 2022-03-22 – 2022-03-23 (×3): 650 mg via ORAL
  Filled 2022-03-22 (×5): qty 2

## 2022-03-22 MED ORDER — HYDROCHLOROTHIAZIDE 12.5 MG PO TABS: 12.5000 mg | ORAL_TABLET | Freq: Once | ORAL | Status: DC

## 2022-03-22 MED ORDER — HYDROCODONE-ACETAMINOPHEN 5-325 MG PO TABS
1.0000 | ORAL_TABLET | ORAL | Status: DC | PRN
  Administered 2022-03-22 – 2022-03-24 (×6): 1 via ORAL
  Filled 2022-03-22 (×6): qty 1

## 2022-03-22 MED ORDER — ACETAMINOPHEN 650 MG RE SUPP: 650.0000 mg | Freq: Four times a day (QID) | RECTAL | Status: DC | PRN

## 2022-03-22 MED ORDER — LOSARTAN POTASSIUM 25 MG PO TABS
100.0000 mg | ORAL_TABLET | Freq: Once | ORAL | Status: AC
  Administered 2022-03-22: 100 mg via ORAL
  Filled 2022-03-22: qty 4

## 2022-03-22 MED ORDER — ALBUTEROL SULFATE HFA 108 (90 BASE) MCG/ACT IN AERS: 1.0000 | INHALATION_SPRAY | Freq: Four times a day (QID) | RESPIRATORY_TRACT | Status: DC | PRN

## 2022-03-22 MED ORDER — ENOXAPARIN SODIUM 40 MG/0.4ML IJ SOSY
40.0000 mg | PREFILLED_SYRINGE | INTRAMUSCULAR | Status: DC
  Administered 2022-03-22: 40 mg via SUBCUTANEOUS
  Filled 2022-03-22: qty 0.4

## 2022-03-22 MED ORDER — ONDANSETRON HCL 4 MG/2ML IJ SOLN
4.0000 mg | Freq: Four times a day (QID) | INTRAMUSCULAR | Status: DC | PRN
  Administered 2022-03-22 – 2022-03-23 (×3): 4 mg via INTRAVENOUS
  Filled 2022-03-22 (×3): qty 2

## 2022-03-22 MED ORDER — SODIUM CHLORIDE 0.9 % IV SOLN
1.0000 g | INTRAVENOUS | Status: DC
  Administered 2022-03-23 – 2022-03-24 (×2): 1 g via INTRAVENOUS
  Filled 2022-03-22 (×2): qty 10

## 2022-03-22 MED ORDER — LABETALOL HCL 5 MG/ML IV SOLN
10.0000 mg | INTRAVENOUS | Status: DC | PRN
  Administered 2022-03-22 (×2): 10 mg via INTRAVENOUS
  Filled 2022-03-22 (×2): qty 4

## 2022-03-22 MED ORDER — HYDRALAZINE HCL 20 MG/ML IJ SOLN
20.0000 mg | INTRAMUSCULAR | Status: DC | PRN
  Administered 2022-03-22 – 2022-03-23 (×2): 20 mg via INTRAVENOUS
  Filled 2022-03-22 (×3): qty 1

## 2022-03-22 MED ORDER — HYDRALAZINE HCL 25 MG PO TABS
100.0000 mg | ORAL_TABLET | Freq: Once | ORAL | Status: AC
  Administered 2022-03-22: 100 mg via ORAL
  Filled 2022-03-22: qty 4

## 2022-03-22 MED ORDER — ENOXAPARIN SODIUM 30 MG/0.3ML IJ SOSY: 30.0000 mg | PREFILLED_SYRINGE | INTRAMUSCULAR | Status: DC

## 2022-03-22 MED ORDER — TAMSULOSIN HCL 0.4 MG PO CAPS
0.8000 mg | ORAL_CAPSULE | Freq: Every day | ORAL | Status: DC
  Administered 2022-03-22 – 2022-03-24 (×3): 0.8 mg via ORAL
  Filled 2022-03-22 (×3): qty 2

## 2022-03-22 MED ORDER — LABETALOL HCL 5 MG/ML IV SOLN
5.0000 mg | INTRAVENOUS | Status: DC | PRN
  Administered 2022-03-22 (×2): 5 mg via INTRAVENOUS
  Filled 2022-03-22 (×2): qty 4

## 2022-03-22 NOTE — Assessment & Plan Note (Addendum)
Patient presents with suprapubic pain, leukocytosis, UA with 11-20 white blood cell, CT abdomen and pelvis with bladder wall thickening and multiple bladder diverticula, nausea vomiting. ?-Urine culture; growing Enterococcus faecali.  ?-Had Foley catheter placed 3/25. Urology recommend to keep foley for 5--7 days. Follow up out patient.  ?-change antibiotics to ampicillin 3/27. ?-Enterococcus sensitive to ampicillin. He will be discharge on amoxicillin for 7 days.  ?Follow up with urology for voiding trial ?

## 2022-03-22 NOTE — ED Triage Notes (Addendum)
Patient said he woke up this morning and his "pee stinks like no other." He thinks he has a UTI which he has had in the past. He says he feels like he cannot empty his bladder. He said he did not take his blood pressure pill this morning.  ?

## 2022-03-22 NOTE — ED Provider Notes (Signed)
?[facility] DEPT ?Provider Note ? ?CSN: [id number] ?Arrival date & time: 03/22/22 0546 ? ?Chief Complaint(s) ?Dysuria ? ?HPI ?[name] is a 71 y.o. male with a past medical history listed below including diastolic heart failure, diabetes, hypertension, prior urinary retention and urinary tract infections. ? ?He is here for lower abdominal spasms.  Pain improves with urination. States that it feels like he might have a urinary tract infection.  Denies any nausea or vomiting.  No flank pain.  No fevers or chills.  No other physical complaints. ? ? ?HPI ? ?Past Medical History ?Past Medical History:  ?Diagnosis Date  ? Alcohol dependence ([place]) 12/10/2013  ? Angioedema of lips 12/24/2013  ? Brain tumor ([place])   ? CHF (congestive heart failure) ([place])   ? diastolic  ? Chronic back pain   ? Chronic headache   ? CVA (cerebral vascular accident) ([place])   ? Diabetes mellitus   ? 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[place]) 02/18/2020  ? HTN (hypertension), malignant 09/06/2012  ? Hypertension   ? Narcotic abuse ([place])   ? Normal cardiac stress test 2009  ? TIA (transient ischemic attack) 09/06/2012  ? Tobacco use disorder 12/10/2013  ? UTI (urinary tract infection)   ? Vertigo 09/06/2012  ? ?Patient Active Problem List  ? Diagnosis Date Noted  ? UTI (urinary tract infection) 03/22/2022  ? Complicated UTI (urinary tract infection) 02/18/2020  ? 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[place]) 02/18/2020  ? Epigastric abdominal pain   ? Epigastric pain 02/16/2020  ? Hypertensive urgency 02/16/2020  ? Cardiac murmur 02/16/2020  ? Acute pyelonephritis 02/16/2020  ? Syncope 01/23/2017  ? Elevated troponin   ? Nausea 11/01/2016  ? Dysuria 11/01/2016  ? Chronic diastolic heart failure ([place]) 03/20/2015  ? Chest pain 03/18/2015  ? History of TIA (transient ischemic attack) 01/27/2015  ? Enterococcus UTI 01/27/2015  ? Cough 01/27/2015  ? Bronchitis 01/27/2015  ? HCAP (healthcare-associated  pneumonia)   ? CHF (congestive heart failure) ([place]) 01/26/2015  ? History of alcohol abuse 01/26/2015  ? Pyelonephritis 01/12/2015  ? Prostatitis 01/12/2015  ? Essential hypertension 11/03/2014  ? Hypokalemia 11/03/2014  ? Fever 08/15/2014  ? Chronic pain 12/24/2013  ? Tobacco use disorder 12/10/2013  ? Leukocytosis 12/10/2013  ? Diabetes mellitus type 2, controlled ([place]) 09/06/2012  ? Pain in the side 09/06/2012  ? Pituitary adenoma ([place]) 09/06/2012  ? ?Home Medication(s) ?Prior to Admission medications   ?Medication Sig Start Date End Date Taking? Authorizing Provider  ?albuterol (PROVENTIL HFA;VENTOLIN HFA) 108 (90 Base) MCG/ACT inhaler Inhale 1-2 puffs into the lungs every 6 (six) hours as needed for wheezing. 09/04/16   Tanna Furry, MD  ?amLODipine (NORVASC) 10 MG tablet Take 10 mg by mouth daily.    [provider]  ?aspirin EC 81 MG tablet Take 81 mg by mouth daily.    [provider]  ?ciprofloxacin (CIPRO) 500 MG tablet Take 1 tablet by mouth 2 times a day. 05/03/21     ?cyclobenzaprine (FLEXERIL) 10 MG tablet Take 1 tablet (10 mg total) by mouth 2 (two) times daily as needed for muscle spasms. ?Patient not taking: No sig reported 03/08/20   Ripley Fraise, MD  ?dapagliflozin propanediol (FARXIGA) 5 MG TABS tablet Take 1 tablet by mouth once daily 06/04/21     ?diltiazem (CARDIZEM CD) 240 MG 24 hr capsule Take 1 capsule (240 mg total) by mouth daily. 02/21/20   Eugenie Filler, MD  ?docusate sodium (COLACE) 100 MG capsule Take 1  capsule (100 mg total) by mouth every 12 (twelve) hours. ?Patient taking differently: Take 100 mg by mouth daily. 01/02/18   Little, Wenda Overland, MD  ?folic acid (FOLVITE) 1 MG tablet Take 1 tablet (1 mg total) by mouth daily. 02/21/20   Eugenie Filler, MD  ?gabapentin (NEURONTIN) 100 MG capsule Take 100 mg by mouth 2 (two) times daily. 06/10/16   [provider]  ?hydrALAZINE (APRESOLINE) 100 MG tablet Take 100 mg by mouth 3 (three) times daily. 07/08/20    [provider]  ?hydrALAZINE (APRESOLINE) 25 MG tablet Take 3 tablets (75 mg total) by mouth every 8 (eight) hours. ?Patient not taking: No sig reported 02/20/20   Eugenie Filler, MD  ?hydrochlorothiazide (MICROZIDE) 12.5 MG capsule Take 1 capsule (12.5 mg total) by mouth daily. ?Patient not taking: No sig reported 02/20/20   Eugenie Filler, MD  ?HYDROcodone-acetaminophen [facility]) 5-325 MG tablet Take 1 tablet by mouth every 4 (four) hours as needed. 04/07/21   Daleen Bo, MD  ?ibuprofen (ADVIL,MOTRIN) 800 MG tablet Take 0.5 tablets (400 mg total) by mouth every 6 (six) hours as needed (pain). ?Patient taking differently: Take 800 mg by mouth 3 (three) times daily. 06/16/18   Fawze, Mina A, PA-C  ?losartan-hydrochlorothiazide (HYZAAR) 100-12.5 MG tablet Take 1 tablet by mouth daily. 01/02/21   [provider]  ?metFORMIN (GLUCOPHAGE) 500 MG tablet Take 500 mg by mouth 2 (two) times daily with a meal.    [provider]  ?metFORMIN (GLUCOPHAGE) 500 MG tablet Take 1 tablet by mouth twice a day 06/04/21     ?Multiple Vitamin (MULTIVITAMIN WITH MINERALS) TABS tablet Take 1 tablet by mouth daily. ?Patient not taking: No sig reported 02/21/20   Eugenie Filler, MD  ?nicotine (NICODERM CQ - DOSED IN MG/24 HOURS) 21 mg/24hr patch Place 1 patch (21 mg total) onto the skin daily. 02/21/20   Eugenie Filler, MD  ?oxybutynin (DITROPAN) 5 MG tablet Take 1 tablet (5 mg total) by mouth every 8 (eight) hours as needed for bladder spasms. 04/07/21   Daleen Bo, MD  ?pantoprazole (PROTONIX) 40 MG tablet Take 1 tablet (40 mg total) by mouth daily. 02/20/20   Eugenie Filler, MD  ?phenazopyridine (PYRIDIUM) 200 MG tablet Take 1 tablet (200 mg total) by mouth 3 (three) times daily. 12/13/20   Isla Pence, MD  ?sucralfate (CARAFATE) 1 GM/10ML suspension Take 10 mLs (1 g total) by mouth 4 (four) times daily -  with meals and at bedtime. 02/20/20   Eugenie Filler, MD  ?tamsulosin (FLOMAX) 0.4  MG CAPS capsule TAKE TWO CAPSULES BY MOUTH AT BEDTIME 05/08/21     ?thiamine 100 MG tablet Take 1 tablet (100 mg total) by mouth daily. 02/21/20   Eugenie Filler, MD  ?tiZANidine (ZANAFLEX) 4 MG tablet Take 1 tablet by mouth twice a day as needed 06/04/21     ?traMADol (ULTRAM) 50 MG tablet Take 50 mg by mouth 2 (two) times daily as needed for severe pain. 10/05/20   [provider]  ?traMADol (ULTRAM) 50 MG tablet Take 1 tablet by mouth twice a day as needed for severe pains 06/04/21     ?Vitamin D, Ergocalciferol, (DRISDOL) 1.25 MG (50000 UNIT) CAPS capsule Take 50,000 Units by mouth once a week. 02/14/20   [provider]  ?Vitamin D, Ergocalciferol, 50000 units CAPS Take 1 capsule by mouth once weekly 06/04/21     ?                                                                                                                                  ?  Allergies ?Patient has no known allergies. ? ?Review of Systems ?Review of Systems ?As noted in HPI ? ?Physical Exam ?Vital Signs  ?I have reviewed the triage vital signs ?BP (!) 216/61   Pulse 63   Temp 98 ?F (36.7 ?C) (Oral)   Resp 16   Ht '5\' 9"'$  (1.753 m)   Wt 74.8 kg   SpO2 100%   BMI 24.37 kg/m?  ? ?Physical Exam ?Vitals reviewed.  ?Constitutional:   ?   General: He is not in acute distress. ?   Appearance: He is well-developed. He is not diaphoretic.  ?HENT:  ?   Head: Normocephalic and atraumatic.  ?   Nose: Nose normal.  ?Eyes:  ?   General: No scleral icterus.    ?   Right eye: No discharge.     ?   Left eye: No discharge.  ?   Conjunctiva/sclera: Conjunctivae normal.  ?   Pupils: Pupils are equal, round, and reactive to light.  ?Cardiovascular:  ?   Rate and Rhythm: Normal rate and regular rhythm.  ?   Heart sounds: No murmur heard. ?  No friction rub. No gallop.  ?Pulmonary:  ?   Effort: Pulmonary effort is normal. No respiratory distress.  ?   Breath sounds: Normal breath sounds. No stridor. No rales.  ?Abdominal:  ?   General: There is no  distension.  ?   Palpations: Abdomen is soft.  ?   Tenderness: There is abdominal tenderness in the suprapubic area.  ?Musculoskeletal:     ?   General: No tenderness.  ?   Cervical back: Normal range of motion and nec

## 2022-03-22 NOTE — Assessment & Plan Note (Addendum)
SSI.  ?A1c 6.8 ?Resume metformin at discharge.  ? ?

## 2022-03-22 NOTE — Progress Notes (Signed)
?   03/22/22 0851  ?Assess: MEWS Score  ?Temp 98.5 ?F (36.9 ?C)  ?BP (!) 209/59  ?Pulse Rate (!) 104  ?Resp 18  ?SpO2 100 %  ?O2 Device Room Air  ?Assess: MEWS Score  ?MEWS Temp 0  ?MEWS Systolic 2  ?MEWS Pulse 1  ?MEWS RR 0  ?MEWS LOC 0  ?MEWS Score 3  ?MEWS Score Color Yellow  ?Assess: if the MEWS score is Yellow or Red  ?Were vital signs taken at a resting state? Yes  ?Focused Assessment No change from prior assessment  ?Does the patient meet 2 or more of the SIRS criteria? No  ?MEWS guidelines implemented *See Row Information* Yes  ?Treat  ?MEWS Interventions Administered scheduled meds/treatments  ?Take Vital Signs  ?Increase Vital Sign Frequency  Yellow: Q 2hr X 2 then Q 4hr X 2, if remains yellow, continue Q 4hrs  ?Escalate  ?MEWS: Escalate Yellow: discuss with charge nurse/RN and consider discussing with provider and RRT  ?Notify: Charge Nurse/RN  ?Name of Charge Nurse/RN Notified Myriam Jacobson, RN  ?Date Charge Nurse/RN Notified 03/22/22  ?Time Charge Nurse/RN Notified [id number]  ?Assess: SIRS CRITERIA  ?SIRS Temperature  0  ?SIRS Pulse 1  ?SIRS Respirations  0  ?SIRS WBC 0  ?SIRS Score Sum  1  ? ?Will administer scheduled blood pressure medications and PRN antihypertensives as needed. Charge nurse notified. Will continue to monitor ?

## 2022-03-22 NOTE — H&P (Signed)
?History and Physical  ? ? ?Patient: [name] XBL:[id number] DOB: 1951/11/27 ?DOA: 03/22/2022 ?DOS: the patient was seen and examined on 03/22/2022 ?PCP: Nolene Ebbs, MD  ?Patient coming from: Home ? ?Chief Complaint:  ?Chief Complaint  ?Patient presents with  ? Dysuria  ? ?HPI: [name] is a 71 y.o. male with medical history significant of hypertension, diastolic heart failure, history of CVA, diabetes, tobacco use disorder, who presents complaining of abdominal pain, suprapubic area, sharp in nature, started the morning  of admission.  He has a prior history of urinary retention.  He reports bladder spasm.  He denies dysuria.  No flank pain.  He was able to urinate 300 but he still had 380 on bladder scan.  Plan to monitor urine ut put.  ? ? ?He was found to have severe elevation of blood pressure systolic blood pressure 300/76, he received IV hydralazine and his home blood pressure medication will resume.  Labs sodium 132, creatinine 1.5, hemoglobin 12, white blood cell 15.8, UA with 11-20 white blood cells, many bacteria. ? ?CT abdomen -Pelvis: Urinary bladder is moderately distended with severe wall thickening and numerous bladder wall diverticula, as above. Given the severe bladder wall thickening, clinical correlation for signs and symptoms of cystitis is suggested. Colonic diverticulosis without evidence of acute diverticulitis at this time. ? ?He had two episodes of vomiting, when he arrives to floor.  ? ? ?Review of Systems: As mentioned in the history of present illness. All other systems reviewed and are negative. ?Past Medical History:  ?Diagnosis Date  ? Alcohol dependence ([place]) 12/10/2013  ? Angioedema of lips 12/24/2013  ? Brain tumor ([place])   ? CHF (congestive heart failure) ([place])   ? diastolic  ? Chronic back pain   ? Chronic headache   ? CVA (cerebral vascular accident) ([place])   ? Diabetes mellitus   ? 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[place]) 02/18/2020  ? HTN  (hypertension), malignant 09/06/2012  ? Hypertension   ? Narcotic abuse ([place])   ? Normal cardiac stress test 2009  ? TIA (transient ischemic attack) 09/06/2012  ? Tobacco use disorder 12/10/2013  ? UTI (urinary tract infection)   ? Vertigo 09/06/2012  ? ?Past Surgical History:  ?Procedure Laterality Date  ? NO PAST SURGERIES    ? ?Social History:  reports that he has been smoking cigarettes. He has been smoking an average of .5 packs per day. He uses smokeless tobacco. He reports current alcohol use. He reports that he does not currently use drugs after having used the following drugs: Marijuana, Cocaine, and IV. ? ?No Known Allergies ? ?Family History  ?Problem Relation Age of Onset  ? CAD Father   ?     "heart attack in his 39's"  ? Hypertension Father   ? Diabetes Brother   ? Diabetes Sister   ? Hypertension Mother   ? ? ?Prior to Admission medications   ?Medication Sig Start Date End Date Taking? Authorizing Provider  ?albuterol (PROVENTIL HFA;VENTOLIN HFA) 108 (90 Base) MCG/ACT inhaler Inhale 1-2 puffs into the lungs every 6 (six) hours as needed for wheezing. 09/04/16   Tanna Furry, MD  ?amLODipine (NORVASC) 10 MG tablet Take 10 mg by mouth daily.    [provider]  ?aspirin EC 81 MG tablet Take 81 mg by mouth daily.    [provider]  ?ciprofloxacin (CIPRO) 500 MG tablet Take 1 tablet by mouth 2 times a day. 05/03/21     ?cyclobenzaprine (FLEXERIL) 10  MG tablet Take 1 tablet (10 mg total) by mouth 2 (two) times daily as needed for muscle spasms. ?Patient not taking: No sig reported 03/08/20   Ripley Fraise, MD  ?dapagliflozin propanediol (FARXIGA) 5 MG TABS tablet Take 1 tablet by mouth once daily 06/04/21     ?diltiazem (CARDIZEM CD) 240 MG 24 hr capsule Take 1 capsule (240 mg total) by mouth daily. 02/21/20   Eugenie Filler, MD  ?docusate sodium (COLACE) 100 MG capsule Take 1 capsule (100 mg total) by mouth every 12 (twelve) hours. ?Patient taking differently: Take 100 mg by mouth daily. 01/02/18    Little, Wenda Overland, MD  ?folic acid (FOLVITE) 1 MG tablet Take 1 tablet (1 mg total) by mouth daily. 02/21/20   Eugenie Filler, MD  ?gabapentin (NEURONTIN) 100 MG capsule Take 100 mg by mouth 2 (two) times daily. 06/10/16   [provider]  ?hydrALAZINE (APRESOLINE) 100 MG tablet Take 100 mg by mouth 3 (three) times daily. 07/08/20   [provider]  ?hydrALAZINE (APRESOLINE) 25 MG tablet Take 3 tablets (75 mg total) by mouth every 8 (eight) hours. ?Patient not taking: No sig reported 02/20/20   Eugenie Filler, MD  ?hydrochlorothiazide (MICROZIDE) 12.5 MG capsule Take 1 capsule (12.5 mg total) by mouth daily. ?Patient not taking: No sig reported 02/20/20   Eugenie Filler, MD  ?HYDROcodone-acetaminophen [facility]) 5-325 MG tablet Take 1 tablet by mouth every 4 (four) hours as needed. 04/07/21   Daleen Bo, MD  ?ibuprofen (ADVIL,MOTRIN) 800 MG tablet Take 0.5 tablets (400 mg total) by mouth every 6 (six) hours as needed (pain). ?Patient taking differently: Take 800 mg by mouth 3 (three) times daily. 06/16/18   Fawze, Mina A, PA-C  ?losartan-hydrochlorothiazide (HYZAAR) 100-12.5 MG tablet Take 1 tablet by mouth daily. 01/02/21   [provider]  ?metFORMIN (GLUCOPHAGE) 500 MG tablet Take 500 mg by mouth 2 (two) times daily with a meal.    [provider]  ?metFORMIN (GLUCOPHAGE) 500 MG tablet Take 1 tablet by mouth twice a day 06/04/21     ?Multiple Vitamin (MULTIVITAMIN WITH MINERALS) TABS tablet Take 1 tablet by mouth daily. ?Patient not taking: No sig reported 02/21/20   Eugenie Filler, MD  ?nicotine (NICODERM CQ - DOSED IN MG/24 HOURS) 21 mg/24hr patch Place 1 patch (21 mg total) onto the skin daily. 02/21/20   Eugenie Filler, MD  ?oxybutynin (DITROPAN) 5 MG tablet Take 1 tablet (5 mg total) by mouth every 8 (eight) hours as needed for bladder spasms. 04/07/21   Daleen Bo, MD  ?pantoprazole (PROTONIX) 40 MG tablet Take 1 tablet (40 mg total) by mouth daily.  02/20/20   Eugenie Filler, MD  ?phenazopyridine (PYRIDIUM) 200 MG tablet Take 1 tablet (200 mg total) by mouth 3 (three) times daily. 12/13/20   Isla Pence, MD  ?sucralfate (CARAFATE) 1 GM/10ML suspension Take 10 mLs (1 g total) by mouth 4 (four) times daily -  with meals and at bedtime. 02/20/20   Eugenie Filler, MD  ?tamsulosin (FLOMAX) 0.4 MG CAPS capsule TAKE TWO CAPSULES BY MOUTH AT BEDTIME 05/08/21     ?thiamine 100 MG tablet Take 1 tablet (100 mg total) by mouth daily. 02/21/20   Eugenie Filler, MD  ?tiZANidine (ZANAFLEX) 4 MG tablet Take 1 tablet by mouth twice a day as needed 06/04/21     ?traMADol (ULTRAM) 50 MG tablet Take 50 mg by mouth 2 (two) times daily as needed for severe  pain. 10/05/20   [provider]  ?traMADol (ULTRAM) 50 MG tablet Take 1 tablet by mouth twice a day as needed for severe pains 06/04/21     ?Vitamin D, Ergocalciferol, (DRISDOL) 1.25 MG (50000 UNIT) CAPS capsule Take 50,000 Units by mouth once a week. 02/14/20   [provider]  ?Vitamin D, Ergocalciferol, 50000 units CAPS Take 1 capsule by mouth once weekly 06/04/21     ? ? ?Physical Exam: ?Vitals:  ? 03/22/22 0957 03/22/22 1027 03/22/22 1132 03/22/22 1224  ?BP: (!) 211/59 (!) 195/53 (!) 193/44 (!) 192/60  ?Pulse: 97 (!) 102 (!) 111 (!) 111  ?Resp:   (!) 26 (!) 22  ?Temp:   98.7 ?F (37.1 ?C) 99.5 ?F (37.5 ?C)  ?TempSrc:   Oral Oral  ?SpO2:   98% 99%  ?Weight:      ?Height:      ? ?General; Alert, in no acute distress.  ?CVS; S 1, S 2 RRR ?Lungs; CTA ?Abdomen; Soft, distended, supra-pubic tenderness, no rigidity.  ?Extremities; no edema ?Neuro; Alert and oriented times 3, follows command, Motor strength 5/5 , speech clear.  ? ?Data Reviewed: ? ?CBC , bmet, UA, CT scan reviewed.  ? ?Assessment and Plan: ?CVA (cerebral vascular accident) ([place]) ?History of CVA ?BP controlled.  ?Resume aspirin.  ? ?Hyponatremia ?Suspect hypovolemia. ?Continue with IV fluids.  ? ?UTI (urinary tract infection) ?Patient presents  with suprapubic pain, leukocytosis, UA with 11-20 white blood cell, CT abdomen and pelvis with bladder wall thickening and multiple bladder diverticula, nausea vomiting. ?-Admit for IV fluids, IV antibiotics.  ?-check ur

## 2022-03-22 NOTE — Assessment & Plan Note (Addendum)
History of CVA ?Continue with aspirin.  ?Adjust BP medications.  ?

## 2022-03-22 NOTE — Assessment & Plan Note (Addendum)
Related to hypovolemia. ?Continue with IV fluids.  ?Resolved.  ?

## 2022-03-22 NOTE — Assessment & Plan Note (Addendum)
Presents with Severe HTN. ?He denies chest pain, SOB, Neuro exam non focal.  ?Continue with Norvasc, Hydralazine, Cozaar.  ?Started  Cardizem. He will be discharge on 120 mg Cardizem daily.  ?Hold HCTZ due to AKI.  ?PRN IV hydralazine and labetalol ordered.  ?Started on Cardura.  ?BP better controlled  ?

## 2022-03-22 NOTE — Significant Event (Incomplete)
Rapid Response Event Note  ? ?Reason for Call :  ?Hypertension, lethargic and increase heart rate ? ?Initial Focused Assessment:  ?Patient laying in bed. Heart rate 90-100s. ? ? ?Vomited 2x prior to arrival. Bedside RN previous administered Zofran.  ? ?Interventions:  ?Dr Tyrell Antonio placed orders for Reglan and Labetalol  ? ?Plan of Care:  ? ? ? ?Event Summary:  ? ?MD Notified:  ?Call Time: ?Arrival Time: ?End Time: ? ?Josph Macho, RN ?

## 2022-03-22 NOTE — Progress Notes (Signed)
?   03/22/22 1132  ?Vitals  ?Temp 98.7 ?F (37.1 ?C)  ?Temp Source Oral  ?BP (!) 193/44  ?MAP (mmHg) 83  ?BP Location Left Arm  ?BP Method Automatic  ?Patient Position (if appropriate) Lying  ?Pulse Rate (!) 111  ?Pulse Rate Source Monitor  ?Resp (!) 26  ?MEWS COLOR  ?MEWS Score Color Red  ?Oxygen Therapy  ?SpO2 98 %  ?O2 Device Room Air  ?MEWS Score  ?MEWS Temp 0  ?MEWS Systolic 0  ?MEWS Pulse 2  ?MEWS RR 2  ?MEWS LOC 0  ?MEWS Score 4  ? ?Charge nurse, MD and rapid response notified. PRN and scheduled medications given. Will continue to monitor ?

## 2022-03-22 NOTE — Consult Note (Signed)
?H&P ?Physician requesting consult: Belkys Regalado ? ?Chief Complaint: Urinary retention, difficult Foley catheter placement ? ?History of Present Illness: 71 year old male with a history of bladder diverticuli, urethral stricture, presents with UTI.  Found on bladder scan to have 532 cc.  He was maintaining high residuals with associated UTI.  Foley catheter attempted be placed and met resistance.  Therefore, I was consulted. ? ?Past Medical History:  ?Diagnosis Date  ? Alcohol dependence ([place]) 12/10/2013  ? Angioedema of lips 12/24/2013  ? Brain tumor ([place])   ? CHF (congestive heart failure) ([place])   ? diastolic  ? Chronic back pain   ? Chronic headache   ? CVA (cerebral vascular accident) ([place])   ? Diabetes mellitus   ? 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[place]) 02/18/2020  ? HTN (hypertension), malignant 09/06/2012  ? Hypertension   ? Narcotic abuse ([place])   ? Normal cardiac stress test 2009  ? TIA (transient ischemic attack) 09/06/2012  ? Tobacco use disorder 12/10/2013  ? UTI (urinary tract infection)   ? Vertigo 09/06/2012  ? ?Past Surgical History:  ?Procedure Laterality Date  ? NO PAST SURGERIES    ? ? ?Home Medications:  ?Medications Prior to Admission  ?Medication Sig Dispense Refill Last Dose  ? albuterol (PROVENTIL HFA;VENTOLIN HFA) 108 (90 Base) MCG/ACT inhaler Inhale 1-2 puffs into the lungs every 6 (six) hours as needed for wheezing. 1 Inhaler 0   ? amLODipine (NORVASC) 10 MG tablet Take 10 mg by mouth daily.     ? aspirin EC 81 MG tablet Take 81 mg by mouth daily.     ? ciprofloxacin (CIPRO) 500 MG tablet Take 1 tablet by mouth 2 times a day. (Patient taking differently: Take 500 mg by mouth 2 (two) times daily.) 14 tablet 0   ? cyclobenzaprine (FLEXERIL) 10 MG tablet Take 1 tablet (10 mg total) by mouth 2 (two) times daily as needed for muscle spasms. (Patient not taking: No sig reported) 20 tablet 0   ? dapagliflozin propanediol (FARXIGA) 5 MG TABS tablet Take 1 tablet by mouth once daily  (Patient taking differently: Take 5 mg by mouth daily.) 90 tablet 2   ? diltiazem (CARDIZEM CD) 240 MG 24 hr capsule Take 1 capsule (240 mg total) by mouth daily. 30 capsule 1   ? docusate sodium (COLACE) 100 MG capsule Take 1 capsule (100 mg total) by mouth every 12 (twelve) hours. (Patient taking differently: Take 100 mg by mouth daily.) 30 capsule 0   ? folic acid (FOLVITE) 1 MG tablet Take 1 tablet (1 mg total) by mouth daily.     ? gabapentin (NEURONTIN) 100 MG capsule Take 100 mg by mouth 2 (two) times daily.  0   ? hydrALAZINE (APRESOLINE) 100 MG tablet Take 100 mg by mouth 3 (three) times daily.     ? hydrALAZINE (APRESOLINE) 25 MG tablet Take 3 tablets (75 mg total) by mouth every 8 (eight) hours. (Patient not taking: No sig reported) 270 tablet 1   ? hydrochlorothiazide (MICROZIDE) 12.5 MG capsule Take 1 capsule (12.5 mg total) by mouth daily. 30 capsule 1   ? HYDROcodone-acetaminophen (NORCO) 5-325 MG tablet Take 1 tablet by mouth every 4 (four) hours as needed. (Patient taking differently: Take 1 tablet by mouth every 4 (four) hours as needed for moderate pain.) 15 tablet 0   ? ibuprofen (ADVIL,MOTRIN) 800 MG tablet Take 0.5 tablets (400 mg total) by mouth every 6 (six) hours as needed (pain). (Patient taking differently: Take 800 mg  by mouth 3 (three) times daily.) 30 tablet 0   ? losartan-hydrochlorothiazide (HYZAAR) 100-12.5 MG tablet Take 1 tablet by mouth daily.     ? metFORMIN (GLUCOPHAGE) 500 MG tablet Take 500 mg by mouth 2 (two) times daily with a meal.     ? metFORMIN (GLUCOPHAGE) 500 MG tablet Take 1 tablet by mouth twice a day (Patient taking differently: Take 500 mg by mouth 2 (two) times daily with a meal.) 180 tablet 2   ? Multiple Vitamin (MULTIVITAMIN WITH MINERALS) TABS tablet Take 1 tablet by mouth daily.     ? nicotine (NICODERM CQ - DOSED IN MG/24 HOURS) 21 mg/24hr patch Place 1 patch (21 mg total) onto the skin daily. 28 patch 0   ? oxybutynin (DITROPAN) 5 MG tablet Take 1 tablet (5  mg total) by mouth every 8 (eight) hours as needed for bladder spasms. 30 tablet 0   ? pantoprazole (PROTONIX) 40 MG tablet Take 1 tablet (40 mg total) by mouth daily. 30 tablet 1   ? phenazopyridine (PYRIDIUM) 200 MG tablet Take 1 tablet (200 mg total) by mouth 3 (three) times daily. 6 tablet 0   ? sucralfate (CARAFATE) 1 GM/10ML suspension Take 10 mLs (1 g total) by mouth 4 (four) times daily -  with meals and at bedtime. 420 mL 0   ? tamsulosin (FLOMAX) 0.4 MG CAPS capsule TAKE TWO CAPSULES BY MOUTH AT BEDTIME 60 capsule 11   ? thiamine 100 MG tablet Take 1 tablet (100 mg total) by mouth daily.     ? tiZANidine (ZANAFLEX) 4 MG tablet Take 1 tablet by mouth twice a day as needed (Patient taking differently: Take 4 mg by mouth 2 (two) times daily as needed for muscle spasms.) 60 tablet 2   ? traMADol (ULTRAM) 50 MG tablet Take 1 tablet by mouth twice a day as needed for severe pains (Patient taking differently: Take 50 mg by mouth every 12 (twelve) hours as needed for severe pain.) 10 tablet 0   ? Vitamin D, Ergocalciferol, 50000 units CAPS Take 1 capsule by mouth once weekly (Patient taking differently: Take 50,000 Units by mouth once a week.) 4 capsule 5   ? ?Allergies: No Known Allergies ? ?Family History  ?Problem Relation Age of Onset  ? CAD Father   ?     "heart attack in his 75's"  ? Hypertension Father   ? Diabetes Brother   ? Diabetes Sister   ? Hypertension Mother   ? ?Social History:  reports that he has been smoking cigarettes. He has been smoking an average of .5 packs per day. He uses smokeless tobacco. He reports current alcohol use. He reports that he does not currently use drugs after having used the following drugs: Marijuana, Cocaine, and IV. ? ?ROS: ?A complete review of systems was performed.  All systems are negative except for pertinent findings as noted. ?ROS ? ? ?Physical Exam:  ?Vital signs in last 24 hours: ?Temp:  [98 ?F (36.7 ?C)-100.7 ?F (38.2 ?C)] 98.3 ?F (36.8 ?C) (03/25 1636) ?Pulse  Rate:  [63-111] 86 (03/25 1636) ?Resp:  [16-28] 28 (03/25 1636) ?BP: (101-227)/(44-145) 217/62 (03/25 1636) ?SpO2:  [96 %-100 %] 96 % (03/25 1636) ?Weight:  [74.8 kg] 74.8 kg (03/25 0557) ?General:  Alert and oriented, No acute distress ?HEENT: Normocephalic, atraumatic ?Neck: No JVD or lymphadenopathy ?Cardiovascular: Regular rate and rhythm ?Lungs: Regular rate and effort ?Abdomen: Soft, nontender, nondistended, no abdominal masses ?Back: No CVA tenderness ?Extremities: No edema ?  Neurologic: Grossly intact ? ?Laboratory Data:  ?Results for orders placed or performed during the hospital encounter of 03/22/22 (from the past 24 hour(s))  ?Urinalysis, Routine w reflex microscopic Urine, Clean Catch     Status: Abnormal  ? Collection Time: 03/22/22  5:54 AM  ?Result Value Ref Range  ? Color, Urine YELLOW YELLOW  ? APPearance CLEAR CLEAR  ? Specific Gravity, Urine <1.005 (L) 1.005 - 1.030  ? pH 7.5 5.0 - 8.0  ? Glucose, UA NEGATIVE NEGATIVE mg/dL  ? Hgb urine dipstick MODERATE (A) NEGATIVE  ? Bilirubin Urine NEGATIVE NEGATIVE  ? Ketones, ur NEGATIVE NEGATIVE mg/dL  ? Protein, ur 30 (A) NEGATIVE mg/dL  ? Nitrite NEGATIVE NEGATIVE  ? Leukocytes,Ua LARGE (A) NEGATIVE  ?Urinalysis, Microscopic (reflex)     Status: Abnormal  ? Collection Time: 03/22/22  5:54 AM  ?Result Value Ref Range  ? RBC / HPF 6-10 0 - 5 RBC/hpf  ? WBC, UA 11-20 0 - 5 WBC/hpf  ? Bacteria, UA MANY (A) NONE SEEN  ? Squamous Epithelial / LPF 0-5 0 - 5  ?Basic metabolic panel     Status: Abnormal  ? Collection Time: 03/22/22  6:48 AM  ?Result Value Ref Range  ? Sodium 132 (L) 135 - 145 mmol/L  ? Potassium 4.2 3.5 - 5.1 mmol/L  ? Chloride 104 98 - 111 mmol/L  ? CO2 17 (L) 22 - 32 mmol/L  ? Glucose, Bld 144 (H) 70 - 99 mg/dL  ? BUN 32 (H) 8 - 23 mg/dL  ? Creatinine, Ser 1.51 (H) 0.61 - 1.24 mg/dL  ? Calcium 9.4 8.9 - 10.3 mg/dL  ? GFR, Estimated 50 (L) >60 mL/min  ? Anion gap 11 5 - 15  ?CBC     Status: Abnormal  ? Collection Time: 03/22/22  6:48 AM  ?Result  Value Ref Range  ? WBC 15.8 (H) 4.0 - 10.5 K/uL  ? RBC 5.28 4.22 - 5.81 MIL/uL  ? Hemoglobin 12.6 (L) 13.0 - 17.0 g/dL  ? HCT 39.2 39.0 - 52.0 %  ? MCV 74.2 (L) 80.0 - 100.0 fL  ? MCH 23.9 (L) 26.0 - 34.

## 2022-03-23 DIAGNOSIS — N3000 Acute cystitis without hematuria: Secondary | ICD-10-CM | POA: Diagnosis not present

## 2022-03-23 DIAGNOSIS — E876 Hypokalemia: Secondary | ICD-10-CM | POA: Diagnosis present

## 2022-03-23 DIAGNOSIS — Z8249 Family history of ischemic heart disease and other diseases of the circulatory system: Secondary | ICD-10-CM | POA: Diagnosis not present

## 2022-03-23 DIAGNOSIS — Z79899 Other long term (current) drug therapy: Secondary | ICD-10-CM | POA: Diagnosis not present

## 2022-03-23 DIAGNOSIS — Z7982 Long term (current) use of aspirin: Secondary | ICD-10-CM | POA: Diagnosis not present

## 2022-03-23 DIAGNOSIS — F17210 Nicotine dependence, cigarettes, uncomplicated: Secondary | ICD-10-CM | POA: Diagnosis present

## 2022-03-23 DIAGNOSIS — I5032 Chronic diastolic (congestive) heart failure: Secondary | ICD-10-CM | POA: Diagnosis present

## 2022-03-23 DIAGNOSIS — E861 Hypovolemia: Secondary | ICD-10-CM | POA: Diagnosis present

## 2022-03-23 DIAGNOSIS — Z7984 Long term (current) use of oral hypoglycemic drugs: Secondary | ICD-10-CM | POA: Diagnosis not present

## 2022-03-23 DIAGNOSIS — I110 Hypertensive heart disease with heart failure: Secondary | ICD-10-CM | POA: Diagnosis present

## 2022-03-23 DIAGNOSIS — N390 Urinary tract infection, site not specified: Secondary | ICD-10-CM | POA: Diagnosis present

## 2022-03-23 DIAGNOSIS — I160 Hypertensive urgency: Secondary | ICD-10-CM | POA: Diagnosis present

## 2022-03-23 DIAGNOSIS — R339 Retention of urine, unspecified: Secondary | ICD-10-CM

## 2022-03-23 DIAGNOSIS — N179 Acute kidney failure, unspecified: Secondary | ICD-10-CM | POA: Diagnosis present

## 2022-03-23 DIAGNOSIS — Z8673 Personal history of transient ischemic attack (TIA), and cerebral infarction without residual deficits: Secondary | ICD-10-CM | POA: Diagnosis not present

## 2022-03-23 DIAGNOSIS — N323 Diverticulum of bladder: Secondary | ICD-10-CM | POA: Diagnosis present

## 2022-03-23 DIAGNOSIS — E871 Hypo-osmolality and hyponatremia: Secondary | ICD-10-CM | POA: Diagnosis present

## 2022-03-23 DIAGNOSIS — Z833 Family history of diabetes mellitus: Secondary | ICD-10-CM | POA: Diagnosis not present

## 2022-03-23 DIAGNOSIS — B952 Enterococcus as the cause of diseases classified elsewhere: Secondary | ICD-10-CM | POA: Diagnosis present

## 2022-03-23 DIAGNOSIS — E119 Type 2 diabetes mellitus without complications: Secondary | ICD-10-CM | POA: Diagnosis present

## 2022-03-23 LAB — CBC
HCT: 41.8 % (ref 39.0–52.0)
Hemoglobin: 13.2 g/dL (ref 13.0–17.0)
MCH: 23.1 pg — ABNORMAL LOW (ref 26.0–34.0)
MCHC: 31.6 g/dL (ref 30.0–36.0)
MCV: 73.1 fL — ABNORMAL LOW (ref 80.0–100.0)
Platelets: 273 10*3/uL (ref 150–400)
RBC: 5.72 MIL/uL (ref 4.22–5.81)
RDW: 14.5 % (ref 11.5–15.5)
WBC: 12.8 10*3/uL — ABNORMAL HIGH (ref 4.0–10.5)
nRBC: 0 % (ref 0.0–0.2)

## 2022-03-23 LAB — COMPREHENSIVE METABOLIC PANEL
ALT: 21 U/L (ref 0–44)
AST: 25 U/L (ref 15–41)
Albumin: 3.7 g/dL (ref 3.5–5.0)
Alkaline Phosphatase: 50 U/L (ref 38–126)
Anion gap: 11 (ref 5–15)
BUN: 23 mg/dL (ref 8–23)
CO2: 23 mmol/L (ref 22–32)
Calcium: 9.5 mg/dL (ref 8.9–10.3)
Chloride: 104 mmol/L (ref 98–111)
Creatinine, Ser: 1.09 mg/dL (ref 0.61–1.24)
GFR, Estimated: >60 mL/min (ref >60)
Glucose, Bld: 169 mg/dL — ABNORMAL HIGH (ref 70–99)
Potassium: 3.3 mmol/L — ABNORMAL LOW (ref 3.5–5.1)
Sodium: 138 mmol/L (ref 135–145)
Total Bilirubin: 0.8 mg/dL (ref 0.3–1.2)
Total Protein: 7.6 g/dL (ref 6.5–8.1)

## 2022-03-23 LAB — GLUCOSE, CAPILLARY
Glucose-Capillary: 153 mg/dL — ABNORMAL HIGH (ref 70–99)
Glucose-Capillary: 156 mg/dL — ABNORMAL HIGH (ref 70–99)
Glucose-Capillary: 265 mg/dL — ABNORMAL HIGH (ref 70–99)
Glucose-Capillary: 98 mg/dL (ref 70–99)

## 2022-03-23 LAB — HIV ANTIBODY (ROUTINE TESTING W REFLEX): HIV Screen 4th Generation wRfx: NONREACTIVE

## 2022-03-23 MED ORDER — POTASSIUM CHLORIDE CRYS ER 20 MEQ PO TBCR
40.0000 meq | EXTENDED_RELEASE_TABLET | Freq: Once | ORAL | Status: AC
Start: 2022-03-23 — End: 2022-03-23
  Administered 2022-03-23: 40 meq via ORAL
  Filled 2022-03-23: qty 2

## 2022-03-23 MED ORDER — PANTOPRAZOLE SODIUM 40 MG PO TBEC
40.0000 mg | DELAYED_RELEASE_TABLET | Freq: Two times a day (BID) | ORAL | Status: DC
  Administered 2022-03-23 – 2022-03-25 (×4): 40 mg via ORAL
  Filled 2022-03-23 (×4): qty 1

## 2022-03-23 MED ORDER — CHLORHEXIDINE GLUCONATE CLOTH 2 % EX PADS
6.0000 | MEDICATED_PAD | Freq: Every day | CUTANEOUS | Status: DC
  Administered 2022-03-23 – 2022-03-24 (×2): 6 via TOPICAL

## 2022-03-23 MED ORDER — DILTIAZEM HCL 60 MG PO TABS
60.0000 mg | ORAL_TABLET | Freq: Three times a day (TID) | ORAL | Status: DC
  Administered 2022-03-23 – 2022-03-25 (×6): 60 mg via ORAL
  Filled 2022-03-23 (×7): qty 1

## 2022-03-23 MED ORDER — LOSARTAN POTASSIUM 50 MG PO TABS
100.0000 mg | ORAL_TABLET | Freq: Every day | ORAL | Status: DC
  Administered 2022-03-23 – 2022-03-25 (×3): 100 mg via ORAL
  Filled 2022-03-23 (×3): qty 2

## 2022-03-23 NOTE — Progress Notes (Signed)
?  Progress Note ? ? ?Patient: [name]:[id number] DOB: 04-28-1951 DOA: 03/22/2022     0 ?DOS: the patient was seen and examined on 03/23/2022 ?  ?Brief hospital course: ?71 year old with past medical history significant for hypertension, diastolic heart failure, history of CVA, diabetes, tobacco use disorder who presents complaining of abdominal pain suprapubic area, sharp in nature.  He has a prior history of urinary retention.  He presented with a severe elevation in systolic blood pressure 035/59.  Presented AKI creatinine 1.5.  CT abdomen and pelvis: Showed urinary bladder is moderately distended with severe wall thickening and numerous bladder wall diverticula.  ? ?Patient admitted for uncontrolled hypertension, AKI, UTI, and possible urinary retention ? ?Assessment and Plan: ?CVA (cerebral vascular accident) ([place]) ?History of CVA ?Continue with aspirin.  ?Adjust BP medications.  ? ?Hyponatremia ?Related to hypovolemia. ?Continue with IV fluids.  ?Resolved.  ? ?UTI (urinary tract infection) ?Patient presents with suprapubic pain, leukocytosis, UA with 11-20 white blood cell, CT abdomen and pelvis with bladder wall thickening and multiple bladder diverticula, nausea vomiting. ?-Follow  urine culture.  ?-Had Foley catheter placed 3/25. Urology recommend to keep foley for 5--7 days. Follow up out patient.  ?-Continue with IV ceftriaxone.  ?-WBC trending down.  ? ?Hypertension ?Presents with Severe HTN. ?He denies chest pain, SOB, Neuro exam non focal.  ?Continue with Norvasc, Hydralazine, Cozaar.  ?Start Cardizem.  ?Hold HCTZ due to AKI.  ?PRN IV hydralazine and labetalol ordered.  ?Started on Cardura.  ? ?Controlled type 2 diabetes mellitus without complication, without long-term current use of insulin ([place]) ?SSI.  ?A1c 6.8 ? ? ?Hypokalemia; replete orally.  ? ? ?  ? ?Subjective: he is feeling better. Less abdominal pain.  ?Denies dyspnea, or chest pain.  ?Not eating much. No further vomiting.  ? ?Physical  Exam: ?Vitals:  ? 03/23/22 0402 03/23/22 0512 03/23/22 0741 03/23/22 1102  ?BP: (!) 173/47 (!) 182/54 (!) 192/51 (!) 142/40  ?Pulse: 93 (!) 102 87 91  ?Resp: 20  (!) 24 (!) 22  ?Temp: 100.3 ?F (37.9 ?C) 98.9 ?F (37.2 ?C) 99.2 ?F (37.3 ?C) 98.7 ?F (37.1 ?C)  ?TempSrc: Oral Oral Oral Oral  ?SpO2: 98%  94% 96%  ?Weight:      ?Height:      ? ?General; NAD ?Lung; CTA ?Abdomen; soft nt ? ?Data Reviewed: ? ?Cbc and Bmet reviewed.  ? ?Family Communication: care discussed with patient.  ? ?Disposition: ?Status is: Observation ?The patient remains OBS appropriate and will d/c before 2 midnights. ? Planned Discharge Destination: Home ? ? ? ?Time spent: 45 minutes ? ?Author: ?Elmarie Shiley, MD ?03/23/2022 12:48 PM ? ?For on call review [email or web address].  ?

## 2022-03-23 NOTE — TOC Initial Note (Signed)
Transition of Care (TOC) - Initial/Assessment Note  ? ? ?Patient Details  ?Name: [name] ?MRN: [id number] ?Date of Birth: 1951/08/22 ? ?Transition of Care (TOC) CM/SW Contact:    ?Tawanna Cooler, RN ?Phone Number: ?03/23/2022, 3:37 PM ? ?Clinical Narrative:                 ? ?Transition of Care Department [facility]) has reviewed patient and no TOC needs have been identified at this time. We will continue to monitor patient advancement through interdisciplinary progression rounds. If new patient transition needs arise, please place a TOC consult. ? ? ?Expected Discharge Plan: Home/Self Care ?Barriers to Discharge: Continued Medical Work up ? ? ?Expected Discharge Plan and Services ?Expected Discharge Plan: Home/Self Care ?  ?  ?  ?Living arrangements for the past 2 months: [place] ?                ?  ?Prior Living Arrangements/Services ?Living arrangements for the past 2 months: [place] ?Lives with:: Self ?Patient language and need for interpreter reviewed:: Yes ?Do you feel safe going back to the place where you live?: Yes      ?Need for Family Participation in Patient Care: Yes (Comment) ?Care giver support system in place?: Yes (comment) ?  ?Criminal Activity/Legal Involvement Pertinent to Current Situation/Hospitalization: No - Comment as needed ? ?Activities of Daily Living ?Home Assistive Devices/Equipment: Gilford Rile (specify type) ?ADL Screening (condition at time of admission) ?Patient's cognitive ability adequate to safely complete daily activities?: Yes ?Is the patient deaf or have difficulty hearing?: No ?Does the patient have difficulty seeing, even when wearing glasses/contacts?: No ?Does the patient have difficulty concentrating, remembering, or making decisions?: No ?Patient able to express need for assistance with ADLs?: Yes ?Does the patient have difficulty dressing or bathing?: No ?Independently performs ADLs?: Yes (appropriate for developmental age) ?Does the patient have  difficulty walking or climbing stairs?: No ?Weakness of Legs: None ?Weakness of Arms/Hands: None ? ?  ?Orientation: : Oriented to Self, Oriented to Place, Oriented to  Time, Oriented to Situation ?Alcohol / Substance Use: Alcohol Use, Tobacco Use ?Psych Involvement: No (comment) ? ?Admission diagnosis:  Urinary retention [R33.9] ?UTI (urinary tract infection) [N39.0] ?Acute lower UTI [N39.0] ?Hypertensive urgency [I16.0] ?AKI (acute kidney injury) ([place]) [N17.9] ?Patient Active Problem List  ? Diagnosis Date Noted  ? UTI (urinary tract infection) 03/22/2022  ? Hyponatremia   ? CVA (cerebral vascular accident) [facility])   ? Complicated UTI (urinary tract infection) 02/18/2020  ? 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[place]) 02/18/2020  ? Epigastric abdominal pain   ? Epigastric pain 02/16/2020  ? Hypertensive urgency 02/16/2020  ? Cardiac murmur 02/16/2020  ? Acute pyelonephritis 02/16/2020  ? Syncope 01/23/2017  ? Elevated troponin   ? Nausea 11/01/2016  ? Dysuria 11/01/2016  ? Chronic diastolic heart failure ([place]) 03/20/2015  ? Chest pain 03/18/2015  ? History of TIA (transient ischemic attack) 01/27/2015  ? Enterococcus UTI 01/27/2015  ? Cough 01/27/2015  ? Bronchitis 01/27/2015  ? HCAP (healthcare-associated pneumonia)   ? CHF (congestive heart failure) ([place]) 01/26/2015  ? History of alcohol abuse 01/26/2015  ? Pyelonephritis 01/12/2015  ? Prostatitis 01/12/2015  ? Hypertension 11/03/2014  ? Hypokalemia 11/03/2014  ? Fever 08/15/2014  ? Chronic pain 12/24/2013  ? Tobacco use disorder 12/10/2013  ? Leukocytosis 12/10/2013  ? Controlled type 2 diabetes mellitus without complication, without long-term current use of insulin ([place]) 09/06/2012  ? Pain in the side 09/06/2012  ? Pituitary  adenoma ([place]) 09/06/2012  ? ?PCP:  Nolene Ebbs, MD ?Pharmacy:   ?[facility] [place], [address] ?[address] ?[place] [address] ?Phone:  [phone] Fax: [phone] ? ?[place], [address] ?[address] ?[place] [address] ?Phone: [phone] Fax: [phone] ? ? ?

## 2022-03-23 NOTE — Hospital Course (Addendum)
71 year old with past medical history significant for hypertension, diastolic heart failure, history of CVA, diabetes, tobacco use disorder who presents complaining of abdominal pain suprapubic area, sharp in nature.  He has a prior history of urinary retention.  He presented with a severe elevation in systolic blood pressure 924/46.  Presented AKI creatinine 1.5.  CT abdomen and pelvis: Showed urinary bladder is moderately distended with severe wall thickening and numerous bladder wall diverticula.  ? ?Patient admitted for uncontrolled hypertension, AKI, UTI, and possible urinary retention. Had foley catheter placed. Treated with IV fluids and antibiotics. He will be discharge on amoxicillin to cover enterococcus UTI.  ?

## 2022-03-24 DIAGNOSIS — N179 Acute kidney failure, unspecified: Secondary | ICD-10-CM

## 2022-03-24 LAB — GLUCOSE, CAPILLARY
Glucose-Capillary: 150 mg/dL — ABNORMAL HIGH (ref 70–99)
Glucose-Capillary: 84 mg/dL (ref 70–99)
Glucose-Capillary: 95 mg/dL (ref 70–99)
Glucose-Capillary: 165 mg/dL — ABNORMAL HIGH (ref 70–99)

## 2022-03-24 LAB — BASIC METABOLIC PANEL
Anion gap: 11 (ref 5–15)
BUN: 19 mg/dL (ref 8–23)
CO2: 21 mmol/L — ABNORMAL LOW (ref 22–32)
Calcium: 9.1 mg/dL (ref 8.9–10.3)
Chloride: 107 mmol/L (ref 98–111)
Creatinine, Ser: 1.13 mg/dL (ref 0.61–1.24)
GFR, Estimated: >60 mL/min (ref >60)
Glucose, Bld: 121 mg/dL — ABNORMAL HIGH (ref 70–99)
Potassium: 4.3 mmol/L (ref 3.5–5.1)
Sodium: 139 mmol/L (ref 135–145)

## 2022-03-24 LAB — CBC
HCT: 39.8 % (ref 39.0–52.0)
Hemoglobin: 12.3 g/dL — ABNORMAL LOW (ref 13.0–17.0)
MCH: 23.1 pg — ABNORMAL LOW (ref 26.0–34.0)
MCHC: 30.9 g/dL (ref 30.0–36.0)
MCV: 74.7 fL — ABNORMAL LOW (ref 80.0–100.0)
Platelets: 231 10*3/uL (ref 150–400)
RBC: 5.33 MIL/uL (ref 4.22–5.81)
RDW: 14.6 % (ref 11.5–15.5)
WBC: 11.3 10*3/uL — ABNORMAL HIGH (ref 4.0–10.5)
nRBC: 0 % (ref 0.0–0.2)

## 2022-03-24 MED ORDER — SODIUM CHLORIDE 0.9 % IV SOLN
1.5000 g | Freq: Four times a day (QID) | INTRAVENOUS | Status: DC
  Administered 2022-03-24 – 2022-03-25 (×4): 1.5 g via INTRAVENOUS
  Filled 2022-03-24 (×5): qty 4

## 2022-03-24 NOTE — Progress Notes (Signed)
Dr. Tyrell Antonio notified of patient's BP and BP med due, instructed to hold the afternoon BP med. Patient updated.  ?

## 2022-03-24 NOTE — Evaluation (Signed)
Physical Therapy Evaluation ?Patient Details ?Name: [name] ?MRN: [id number] ?DOB: Feb 18, 1951 ?Today's Date: 03/24/2022 ? ?History of Present Illness ? 71 yo male admitted with UTI, uncontrolled HTN. Hx of HF, CVA, DM, TIA, vertigo, ETOH dep, brain tumor, ETOH abuse  ?Clinical Impression ? On eval, pt was Supv-Min guard assist for mobility. He walked ~250 feet around the unit while pushing/holding on to IV pole. Pt tolerated distance well. He reports he walked in the hallway with nursing on yesterday. Recommend continued ambulation in hallway with nursing supervision to increase activity. Do not anticipate any f/u PT needs at discharge.    ?   ? ?Recommendations for follow up therapy are one component of a multi-disciplinary discharge planning process, led by the attending physician.  Recommendations may be updated based on patient status, additional functional criteria and insurance authorization. ? ?Follow Up Recommendations No PT follow up ? ?  ?Assistance Recommended at Discharge PRN  ?Patient can return home with the following ? A little help with walking and/or transfers;A little help with bathing/dressing/bathroom;Assistance with cooking/housework;Assist for transportation;Help with stairs or ramp for entrance ? ?  ?Equipment Recommendations None recommended by PT (possibly a cane should he decide he wants one/or doesn't have one)  ?Recommendations for Other Services ?    ?  ?Functional Status Assessment Patient has had a recent decline in their functional status and demonstrates the ability to make significant improvements in function in a reasonable and predictable amount of time.  ? ?  ?Precautions / Restrictions Restrictions ?Weight Bearing Restrictions: No  ? ?  ? ?Mobility ? Bed Mobility ?Overal bed mobility: Modified Independent ?  ?  ?  ?  ?  ?  ?  ?  ? ?Transfers ?Overall transfer level: Needs assistance ?  ?Transfers: Sit to/from Stand ?Sit to Stand: Supervision ?  ?  ?  ?  ?  ?General transfer  comment: Supv for safety. Increased time. ?  ? ?Ambulation/Gait ?Ambulation/Gait assistance: Min guard ?Gait Distance (Feet): 250 Feet ?Assistive device: IV Pole ?Gait Pattern/deviations: Decreased stride length, Step-through pattern ?  ?  ?  ?General Gait Details: Min guard for safety. Intermittent unsteadiness but no overt LOB requiring assist from therapist ? ?Stairs ?  ?  ?  ?  ?  ? ?Wheelchair Mobility ?  ? ?Modified Rankin (Stroke Patients Only) ?  ? ?  ? ?Balance Overall balance assessment: Needs assistance ?  ?  ?  ?  ?Standing balance support: During functional activity, Single extremity supported ?Standing balance-Leahy Scale: Fair ?  ?  ?  ?  ?  ?  ?  ?  ?  ?  ?  ?  ?   ? ? ? ?Pertinent Vitals/Pain Pain Assessment ?Pain Assessment: No/denies pain  ? ? ?Home Living Family/patient expects to be discharged to:: Private residence ?Living Arrangements: Alone ?  ?  ?Home Access: Level entry ?  ?  ?  ?Home Layout: One level ?Home Equipment: None ?   ?  ?Prior Function Prior Level of Function : Independent/Modified Independent ?  ?  ?  ?  ?  ?  ?  ?  ?  ? ? ?Hand Dominance  ?   ? ?  ?Extremity/Trunk Assessment  ? Upper Extremity Assessment ?Upper Extremity Assessment: Overall WFL for tasks assessed ?  ? ?Lower Extremity Assessment ?Lower Extremity Assessment: Generalized weakness ?  ? ?Cervical / Trunk Assessment ?Cervical / Trunk Assessment: Normal  ?Communication  ? Communication: No difficulties  ?Cognition  Arousal/Alertness: Awake/alert ?Behavior During Therapy: [facility] for tasks assessed/performed ?Overall Cognitive Status: Within Functional Limits for tasks assessed ?  ?  ?  ?  ?  ?  ?  ?  ?  ?  ?  ?  ?  ?  ?  ?  ?  ?  ?  ? ?  ?General Comments   ? ?  ?Exercises    ? ?Assessment/Plan  ?  ?PT Assessment Patient needs continued PT services  ?PT Problem List Decreased strength;Decreased mobility;Decreased activity tolerance;Decreased balance;Decreased knowledge of use of DME ? ?   ?  ?PT Treatment Interventions  DME instruction;Therapeutic exercise;Gait training;Balance training;Functional mobility training;Therapeutic activities;Patient/family education   ? ?PT Goals (Current goals can be found in the Care Plan section)  ?Acute Rehab PT Goals ?Patient Stated Goal: home soon ?PT Goal Formulation: With patient ?Time For Goal Achievement: 04/07/22 ?Potential to Achieve Goals: Good ? ?  ?Frequency Min 3X/week ?  ? ? ?Co-evaluation   ?  ?  ?  ?  ? ? ?  ?AM-PAC PT "6 Clicks" Mobility  ?Outcome Measure Help needed turning from your back to your side while in a flat bed without using bedrails?: None ?Help needed moving from lying on your back to sitting on the side of a flat bed without using bedrails?: None ?Help needed moving to and from a bed to a chair (including a wheelchair)?: A Little ?Help needed standing up from a chair using your arms (e.g., wheelchair or bedside chair)?: A Little ?Help needed to walk in hospital room?: A Little ?Help needed climbing 3-5 steps with a railing? : A Little ?6 Click Score: 20 ? ?  ?End of Session   ?Activity Tolerance: Patient tolerated treatment well ?Patient left: in chair;with call bell/phone within reach ?  ?PT Visit Diagnosis: Unsteadiness on feet (R26.81) ?  ? ?Time: 0086-7619 ?PT Time Calculation (min) (ACUTE ONLY): 10 min ? ? ?Charges:   PT Evaluation ?$PT Eval Low Complexity: 1 Low ?  ?  ?   ? ? ? ? ? ?Alice Burnside P, PT ?Acute Rehabilitation  ?Office: [phone] ?Pager: [phone] ? ?  ? ?

## 2022-03-24 NOTE — Progress Notes (Signed)
?  Progress Note ? ? ?Patient: [name] ZOX:[id number] DOB: 08-03-1951 DOA: 03/22/2022     1 ?DOS: the patient was seen and examined on 03/24/2022 ?  ?Brief hospital course: ?71 year old with past medical history significant for hypertension, diastolic heart failure, history of CVA, diabetes, tobacco use disorder who presents complaining of abdominal pain suprapubic area, sharp in nature.  He has a prior history of urinary retention.  He presented with a severe elevation in systolic blood pressure 811/91.  Presented AKI creatinine 1.5.  CT abdomen and pelvis: Showed urinary bladder is moderately distended with severe wall thickening and numerous bladder wall diverticula.  ? ?Patient admitted for uncontrolled hypertension, AKI, UTI, and possible urinary retention ? ?Assessment and Plan: ?* UTI (urinary tract infection) ?Patient presents with suprapubic pain, leukocytosis, UA with 11-20 white blood cell, CT abdomen and pelvis with bladder wall thickening and multiple bladder diverticula, nausea vomiting. ?-Urine culture; growing Enterococcus faecali.  ?-Had Foley catheter placed 3/25. Urology recommend to keep foley for 5--7 days. Follow up out patient.  ?change antibiotics to ampicillin.  ?Awaiting culture results to be able to discharge home ?-WBC trending down.  ? ?CVA (cerebral vascular accident) ([place]) ?History of CVA ?Continue with aspirin.  ?Adjust BP medications.  ? ?Hyponatremia ?Related to hypovolemia. ?Continue with IV fluids.  ?Resolved.  ? ?Hypertension ?Presents with Severe HTN. ?He denies chest pain, SOB, Neuro exam non focal.  ?Continue with Norvasc, Hydralazine, Cozaar.  ?Started  Cardizem.  ?Hold HCTZ due to AKI.  ?PRN IV hydralazine and labetalol ordered.  ?Started on Cardura.  ?BP better controlled  ? ?Controlled type 2 diabetes mellitus without complication, without long-term current use of insulin ([place]) ?SSI.  ?A1c 6.8 ? ? ? ? ? ?  ? ?Subjective: he is feeling better, tolerating diet.  ?BP  better controlled.  ? ?Physical Exam: ?Vitals:  ? 03/23/22 2033 03/24/22 0447 03/24/22 0921 03/24/22 1139  ?BP: (!) 141/49 (!) 174/53 131/80 (!) 150/53  ?Pulse: 70 70    ?Resp: 18 14    ?Temp: 98.4 ?F (36.9 ?C) 99.2 ?F (37.3 ?C)    ?TempSrc: Oral Oral    ?SpO2: 95% 96%    ?Weight:      ?Height:      ? ?General; NAD ?CVS; S 1, S 2 RRR ?Lungs; CTA ?Neuro; alert, follows command ? ?Data Reviewed: ? ?Cbc--bmet  ? ?Family Communication: Care discussed with patient.  ? ?Disposition: ?Status is: Inpatient ?Remains inpatient appropriate because: awaiting urine culture results.  ? Planned Discharge Destination: Home ? ? ? ?Time spent: 45 minutes ? ?Author: ?Elmarie Shiley, MD ?03/24/2022 1:58 PM ? ?For on call review [email or web address].  ?

## 2022-03-25 DIAGNOSIS — N3000 Acute cystitis without hematuria: Secondary | ICD-10-CM

## 2022-03-25 LAB — URINE CULTURE: Culture: 40000 — AB

## 2022-03-25 LAB — GLUCOSE, CAPILLARY: Glucose-Capillary: 143 mg/dL — ABNORMAL HIGH (ref 70–99)

## 2022-03-25 MED ORDER — AMOXICILLIN 500 MG PO CAPS: 500.0000 mg | ORAL_CAPSULE | Freq: Three times a day (TID) | ORAL | 0 refills | Status: AC

## 2022-03-25 MED ORDER — TAMSULOSIN HCL 0.4 MG PO CAPS: 0.8000 mg | ORAL_CAPSULE | Freq: Every day | ORAL | 1 refills | Status: DC

## 2022-03-25 MED ORDER — LOSARTAN POTASSIUM 100 MG PO TABS: 100.0000 mg | ORAL_TABLET | Freq: Every day | ORAL | 0 refills | Status: DC

## 2022-03-25 MED ORDER — AMOXICILLIN 250 MG PO CAPS: 500.0000 mg | ORAL_CAPSULE | Freq: Three times a day (TID) | ORAL | Status: DC

## 2022-03-25 MED ORDER — AMLODIPINE BESYLATE 10 MG PO TABS: 10.0000 mg | ORAL_TABLET | Freq: Every day | ORAL | 3 refills | Status: DC

## 2022-03-25 MED ORDER — DILTIAZEM HCL ER COATED BEADS 120 MG PO CP24: 120.0000 mg | ORAL_CAPSULE | Freq: Every day | ORAL | 1 refills | Status: DC

## 2022-03-25 MED ORDER — SODIUM CHLORIDE 0.9 % IV SOLN: INTRAVENOUS | Status: DC | PRN

## 2022-03-25 MED ORDER — HYDRALAZINE HCL 100 MG PO TABS: 100.0000 mg | ORAL_TABLET | Freq: Three times a day (TID) | ORAL | 1 refills | Status: DC

## 2022-03-25 MED ORDER — DOXAZOSIN MESYLATE 4 MG PO TABS: 4.0000 mg | ORAL_TABLET | Freq: Every day | ORAL | 0 refills | Status: DC

## 2022-03-25 NOTE — Plan of Care (Signed)

## 2022-03-25 NOTE — Plan of Care (Signed)

## 2022-03-25 NOTE — Discharge Summary (Signed)
?Physician Discharge Summary ?  ?Patient: [name] MRN: [id number] DOB: 1951-12-23  ?Admit date:     03/22/2022  ?Discharge date: 03/25/22  ?Discharge Physician: Jerald Kief A Geraldyn Shain  ? ?PCP: Nolene Ebbs, MD  ? ?Recommendations at discharge:  ? ? Needs follow up with urology for voiding trial.  ?Needs further adjustment of BP medication depending on BP reading.  ? ?Discharge Diagnoses: ?Principal Problem: ?  UTI (urinary tract infection) ?Active Problems: ?  Controlled type 2 diabetes mellitus without complication, without long-term current use of insulin ([name]) ?  Hypertension ?  Hyponatremia ?  CVA (cerebral vascular accident) [name]) ? ?Resolved Problems: ?  * No resolved hospital problems. * ? ?Hospital Course: ?71 year old with past medical history significant for hypertension, diastolic heart failure, history of CVA, diabetes, tobacco use disorder who presents complaining of abdominal pain suprapubic area, sharp in nature.  He has a prior history of urinary retention.  He presented with a severe elevation in systolic blood pressure 324/40.  Presented AKI creatinine 1.5.  CT abdomen and pelvis: Showed urinary bladder is moderately distended with severe wall thickening and numerous bladder wall diverticula.  ? ?Patient admitted for uncontrolled hypertension, AKI, UTI, and possible urinary retention. Had foley catheter placed. Treated with IV fluids and antibiotics. He will be discharge on amoxicillin to cover enterococcus UTI.  ? ?Assessment and Plan: ?* UTI (urinary tract infection) ?Patient presents with suprapubic pain, leukocytosis, UA with 11-20 white blood cell, CT abdomen and pelvis with bladder wall thickening and multiple bladder diverticula, nausea vomiting. ?-Urine culture; growing Enterococcus faecali.  ?-Had Foley catheter placed 3/25. Urology recommend to keep foley for 5--7 days. Follow up out patient.  ?-change antibiotics to ampicillin 3/27. ?-Enterococcus sensitive to ampicillin. He will be  discharge on amoxicillin for 7 days.  ?Follow up with urology for voiding trial ? ?CVA (cerebral vascular accident) [name]) ?History of CVA ?Continue with aspirin.  ?Adjust BP medications.  ? ?Hyponatremia ?Related to hypovolemia. ?Continue with IV fluids.  ?Resolved.  ? ?Hypertension ?Presents with Severe HTN. ?He denies chest pain, SOB, Neuro exam non focal.  ?Continue with Norvasc, Hydralazine, Cozaar.  ?Started  Cardizem. He will be discharge on 120 mg Cardizem daily.  ?Hold HCTZ due to AKI.  ?PRN IV hydralazine and labetalol ordered.  ?Started on Cardura.  ?BP better controlled  ? ?Controlled type 2 diabetes mellitus without complication, without long-term current use of insulin ([place]) ?SSI.  ?A1c 6.8 ?Resume metformin at discharge.  ? ? ? ? ? ?  ? ? ?Consultants: Urology, Dr Gloriann Loan.  ?Procedures performed: Foley catheter placement.  ?Disposition: Home ?Diet recommendation:  ?Discharge Diet Orders (From admission, onward)  ? ?  Start     Ordered  ? 03/25/22 0000  Diet - low sodium heart healthy       ? 03/25/22 0910  ? ?  ?  ? ?  ? ?Cardiac diet ?DISCHARGE MEDICATION: ?Allergies as of 03/25/2022   ?No Known Allergies ?  ? ?  ?Medication List  ?  ? ?STOP taking these medications   ? ?HYDROcodone-acetaminophen 5-325 MG tablet ?Commonly known as: Norco ?  ?ibuprofen 800 MG tablet ?Commonly known as: ADVIL ?  ?losartan-hydrochlorothiazide 100-12.5 MG tablet ?Commonly known as: HYZAAR ?  ?oxybutynin 5 MG tablet ?Commonly known as: DITROPAN ?  ? ?  ? ?TAKE these medications   ? ?albuterol 108 (90 Base) MCG/ACT inhaler ?Commonly known as: VENTOLIN HFA ?Inhale 1-2 puffs into the lungs every 6 (six) hours as needed  for wheezing. ?  ?amLODipine 10 MG tablet ?Commonly known as: NORVASC ?Take 1 tablet (10 mg total) by mouth daily. ?  ?amoxicillin 500 MG capsule ?Commonly known as: AMOXIL ?Take 1 capsule (500 mg total) by mouth every 8 (eight) hours for 7 days. ?  ?aspirin EC 81 MG tablet ?Take 81 mg by mouth every morning. ?   ?diclofenac Sodium 1 % Gel ?Commonly known as: VOLTAREN ?Apply 1 application. topically 4 (four) times daily as needed (pain). ?  ?diltiazem 120 MG 24 hr capsule ?Commonly known as: Cardizem CD ?Take 1 capsule (120 mg total) by mouth daily. ?  ?doxazosin 4 MG tablet ?Commonly known as: CARDURA ?Take 1 tablet (4 mg total) by mouth daily. ?  ?hydrALAZINE 100 MG tablet ?Commonly known as: APRESOLINE ?Take 1 tablet (100 mg total) by mouth every 8 (eight) hours. ?  ?losartan 100 MG tablet ?Commonly known as: COZAAR ?Take 1 tablet (100 mg total) by mouth daily. ?  ?metFORMIN 500 MG tablet ?Commonly known as: GLUCOPHAGE ?Take 1 tablet by mouth twice a day ?What changed:  ?how much to take ?how to take this ?when to take this ?  ?nicotine 21 mg/24hr patch ?Commonly known as: NICODERM CQ - dosed in mg/24 hours ?Place 1 patch (21 mg total) onto the skin daily. ?What changed:  ?when to take this ?reasons to take this ?  ?pantoprazole 40 MG tablet ?Commonly known as: PROTONIX ?Take 1 tablet (40 mg total) by mouth daily. ?  ?pravastatin 20 MG tablet ?Commonly known as: PRAVACHOL ?Take 20 mg by mouth in the morning. ?  ?tamsulosin 0.4 MG Caps capsule ?Commonly known as: FLOMAX ?TAKE TWO CAPSULES BY MOUTH AT BEDTIME ?  ?traMADol 50 MG tablet ?Commonly known as: ULTRAM ?Take 1 tablet by mouth twice a day as needed for severe pains ?What changed:  ?how much to take ?how to take this ?when to take this ?reasons to take this ?  ? ?  ? ? Follow-up Information   ? ? Nolene Ebbs, MD Follow up in 1 week(s).   ?Specialty: Internal Medicine ?Contact information: ?[address] ?[place] [address] ?[phone] ? ? ?  ?  ? ? Lucas Mallow, MD Follow up.   ?Specialty: Urology ?Why: office will call you with appointment. ?Contact information: ?[place] ?[place] [address] ?[phone] ? ? ?  ?  ? ?  ?  ? ?  ? ?Discharge Exam: ?Danley Danker Weights  ? 03/22/22 0557  ?Weight: 74.8 kg  ? ?General; NAD ?Lung; CTA ?Abdomen;  soft, nt ? ?Condition at discharge: stable ? ?The results of significant diagnostics from this hospitalization (including imaging, microbiology, ancillary and laboratory) are listed below for reference.  ? ?Imaging Studies: ?CT ABDOMEN PELVIS WO CONTRAST ? ?Result Date: 03/22/2022 ?CLINICAL DATA:  71 year old male with history of abdominal pain. Possible urinary tract infection. EXAM: CT ABDOMEN AND PELVIS WITHOUT CONTRAST TECHNIQUE: Multidetector CT imaging of the abdomen and pelvis was performed following the standard protocol without IV contrast. RADIATION DOSE REDUCTION: This exam was performed according to the departmental dose-optimization program which includes automated exposure control, adjustment of the mA and/or kV according to patient size and/or use of iterative reconstruction technique. COMPARISON:  CT the abdomen and pelvis 04/07/2021. FINDINGS: Lower chest: Atherosclerotic calcifications in the descending thoracic aorta as well as the left main, left anterior descending and left circumflex coronary arteries. Mild cardiomegaly. Small hiatal hernia. Hepatobiliary: No definite suspicious cystic or solid hepatic lesions are confidently identified  on today's noncontrast CT examination. Liver has a nodular contour, indicative of underlying cirrhosis. Unenhanced appearance of the gallbladder is normal. Pancreas: No definite pancreatic mass or peripancreatic fluid collections or inflammatory changes are noted on today's noncontrast CT examination. Spleen: Unremarkable. Adrenals/Urinary Tract: No calcifications are identified within the collecting system of either kidney, along the course of either ureter, or within the lumen of the urinary bladder. Unenhanced appearance of the kidneys and bilateral adrenal glands is normal. No hydroureteronephrosis. Urinary bladder is moderately distended and markedly thickened. 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 Smaller posterolateral  diverticulae are also noted bilaterally (left-greater-than-right) measuring up to 4.3 cm on the left and 1.3 cm on the right. Stomach/Bowel: Unenhanced appearance of the stomach is normal. There is no patholo

## 2022-04-01 ENCOUNTER — Emergency Department (HOSPITAL_COMMUNITY)
Admission: EM | Admit: 2022-04-01 | Discharge: 2022-04-01 | Disposition: A | Discharge status: Home / Self Care | Payer: Medicare HMO | Attending: Emergency Medicine | Admitting: Emergency Medicine

## 2022-04-01 ENCOUNTER — Encounter (HOSPITAL_COMMUNITY): Payer: Self-pay | Admitting: Emergency Medicine

## 2022-04-01 ENCOUNTER — Other Ambulatory Visit: Payer: Self-pay

## 2022-04-01 ENCOUNTER — Emergency Department (HOSPITAL_COMMUNITY): Payer: Medicare HMO

## 2022-04-01 DIAGNOSIS — Z8673 Personal history of transient ischemic attack (TIA), and cerebral infarction without residual deficits: Secondary | ICD-10-CM | POA: Insufficient documentation

## 2022-04-01 DIAGNOSIS — M545 Low back pain, unspecified: Secondary | ICD-10-CM | POA: Diagnosis not present

## 2022-04-01 DIAGNOSIS — I110 Hypertensive heart disease with heart failure: Secondary | ICD-10-CM | POA: Diagnosis not present

## 2022-04-01 DIAGNOSIS — Y92410 Unspecified street and highway as the place of occurrence of the external cause: Secondary | ICD-10-CM | POA: Insufficient documentation

## 2022-04-01 DIAGNOSIS — D649 Anemia, unspecified: Secondary | ICD-10-CM | POA: Diagnosis not present

## 2022-04-01 DIAGNOSIS — Z7984 Long term (current) use of oral hypoglycemic drugs: Secondary | ICD-10-CM | POA: Insufficient documentation

## 2022-04-01 DIAGNOSIS — E119 Type 2 diabetes mellitus without complications: Secondary | ICD-10-CM | POA: Insufficient documentation

## 2022-04-01 DIAGNOSIS — Z20822 Contact with and (suspected) exposure to covid-19: Secondary | ICD-10-CM | POA: Insufficient documentation

## 2022-04-01 DIAGNOSIS — I509 Heart failure, unspecified: Secondary | ICD-10-CM | POA: Diagnosis not present

## 2022-04-01 DIAGNOSIS — Z7982 Long term (current) use of aspirin: Secondary | ICD-10-CM | POA: Insufficient documentation

## 2022-04-01 DIAGNOSIS — Z79899 Other long term (current) drug therapy: Secondary | ICD-10-CM | POA: Diagnosis not present

## 2022-04-01 DIAGNOSIS — M546 Pain in thoracic spine: Secondary | ICD-10-CM | POA: Diagnosis not present

## 2022-04-01 DIAGNOSIS — M542 Cervicalgia: Secondary | ICD-10-CM | POA: Insufficient documentation

## 2022-04-01 DIAGNOSIS — D72829 Elevated white blood cell count, unspecified: Secondary | ICD-10-CM | POA: Diagnosis not present

## 2022-04-01 DIAGNOSIS — R109 Unspecified abdominal pain: Secondary | ICD-10-CM | POA: Insufficient documentation

## 2022-04-01 DIAGNOSIS — M25551 Pain in right hip: Secondary | ICD-10-CM | POA: Diagnosis not present

## 2022-04-01 DIAGNOSIS — G8929 Other chronic pain: Secondary | ICD-10-CM | POA: Insufficient documentation

## 2022-04-01 LAB — COMPREHENSIVE METABOLIC PANEL
ALT: 24 U/L (ref 0–44)
AST: 23 U/L (ref 15–41)
Albumin: 4.5 g/dL (ref 3.5–5.0)
Alkaline Phosphatase: 49 U/L (ref 38–126)
Anion gap: 7 (ref 5–15)
BUN: 21 mg/dL (ref 8–23)
CO2: 25 mmol/L (ref 22–32)
Calcium: 9.6 mg/dL (ref 8.9–10.3)
Chloride: 104 mmol/L (ref 98–111)
Creatinine, Ser: 1.09 mg/dL (ref 0.61–1.24)
GFR, Estimated: >60 mL/min (ref >60)
Glucose, Bld: 125 mg/dL — ABNORMAL HIGH (ref 70–99)
Potassium: 4.2 mmol/L (ref 3.5–5.1)
Sodium: 136 mmol/L (ref 135–145)
Total Bilirubin: 0.2 mg/dL — ABNORMAL LOW (ref 0.3–1.2)
Total Protein: 9 g/dL — ABNORMAL HIGH (ref 6.5–8.1)

## 2022-04-01 LAB — URINALYSIS, ROUTINE W REFLEX MICROSCOPIC
Bacteria, UA: NONE SEEN
Bilirubin Urine: NEGATIVE
Glucose, UA: NEGATIVE mg/dL
Hgb urine dipstick: NEGATIVE
Ketones, ur: NEGATIVE mg/dL
Nitrite: NEGATIVE
Protein, ur: NEGATIVE mg/dL
Specific Gravity, Urine: 1.006 (ref 1.005–1.030)
pH: 5 (ref 5.0–8.0)

## 2022-04-01 LAB — CBC
HCT: 41.8 % (ref 39.0–52.0)
Hemoglobin: 12.9 g/dL — ABNORMAL LOW (ref 13.0–17.0)
MCH: 23.5 pg — ABNORMAL LOW (ref 26.0–34.0)
MCHC: 30.9 g/dL (ref 30.0–36.0)
MCV: 76 fL — ABNORMAL LOW (ref 80.0–100.0)
Platelets: 362 10*3/uL (ref 150–400)
RBC: 5.5 MIL/uL (ref 4.22–5.81)
RDW: 14.6 % (ref 11.5–15.5)
WBC: 12.5 10*3/uL — ABNORMAL HIGH (ref 4.0–10.5)
nRBC: 0 % (ref 0.0–0.2)

## 2022-04-01 LAB — I-STAT CHEM 8, ED
BUN: 23 mg/dL (ref 8–23)
Calcium, Ion: 1.21 mmol/L (ref 1.15–1.40)
Chloride: 102 mmol/L (ref 98–111)
Creatinine, Ser: 0.9 mg/dL (ref 0.61–1.24)
Glucose, Bld: 106 mg/dL — ABNORMAL HIGH (ref 70–99)
HCT: 39 % (ref 39.0–52.0)
Hemoglobin: 13.3 g/dL (ref 13.0–17.0)
Potassium: 4.6 mmol/L (ref 3.5–5.1)
Sodium: 137 mmol/L (ref 135–145)
TCO2: 27 mmol/L (ref 22–32)

## 2022-04-01 LAB — LACTIC ACID, PLASMA: Lactic Acid, Venous: 0.9 mmol/L (ref 0.5–1.9)

## 2022-04-01 LAB — ETHANOL: Alcohol, Ethyl (B): <10 mg/dL (ref <10)

## 2022-04-01 LAB — PROTIME-INR
INR: 0.9 (ref 0.8–1.2)
Prothrombin Time: 12.3 seconds (ref 11.4–15.2)

## 2022-04-01 LAB — RESP PANEL BY RT-PCR (FLU A&B, COVID) ARPGX2
Influenza A by PCR: NEGATIVE
Influenza B by PCR: NEGATIVE
SARS Coronavirus 2 by RT PCR: NEGATIVE

## 2022-04-01 MED ORDER — LOSARTAN POTASSIUM 25 MG PO TABS
100.0000 mg | ORAL_TABLET | Freq: Every day | ORAL | Status: DC
  Administered 2022-04-01: 100 mg via ORAL
  Filled 2022-04-01: qty 4

## 2022-04-01 MED ORDER — METHOCARBAMOL 500 MG PO TABS
1000.0000 mg | ORAL_TABLET | Freq: Once | ORAL | Status: AC
Start: 2022-04-01 — End: 2022-04-01
  Administered 2022-04-01: 1000 mg via ORAL
  Filled 2022-04-01: qty 2

## 2022-04-01 MED ORDER — AMLODIPINE BESYLATE 5 MG PO TABS
10.0000 mg | ORAL_TABLET | Freq: Every day | ORAL | Status: DC
  Administered 2022-04-01: 10 mg via ORAL
  Filled 2022-04-01: qty 2

## 2022-04-01 MED ORDER — LACTATED RINGERS IV BOLUS
500.0000 mL | Freq: Once | INTRAVENOUS | Status: AC
  Administered 2022-04-01: 500 mL via INTRAVENOUS

## 2022-04-01 MED ORDER — FENTANYL CITRATE PF 50 MCG/ML IJ SOSY
100.0000 ug | PREFILLED_SYRINGE | Freq: Once | INTRAMUSCULAR | Status: AC
  Administered 2022-04-01: 100 ug via INTRAVENOUS
  Filled 2022-04-01: qty 2

## 2022-04-01 MED ORDER — KETOROLAC TROMETHAMINE 15 MG/ML IJ SOLN
15.0000 mg | Freq: Once | INTRAMUSCULAR | Status: AC
  Administered 2022-04-01: 15 mg via INTRAVENOUS
  Filled 2022-04-01: qty 1

## 2022-04-01 MED ORDER — IOHEXOL 300 MG/ML  SOLN
100.0000 mL | Freq: Once | INTRAMUSCULAR | Status: AC | PRN
  Administered 2022-04-01: 100 mL via INTRAVENOUS
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
## 2022-05-06 ENCOUNTER — Other Ambulatory Visit: Payer: Self-pay | Admitting: Internal Medicine

## 2022-05-07 LAB — URINE CULTURE
MICRO NUMBER:: 13371367
SPECIMEN QUALITY:: ADEQUATE

## 2022-06-05 DIAGNOSIS — G894 Chronic pain syndrome: Secondary | ICD-10-CM | POA: Diagnosis not present

## 2022-06-05 DIAGNOSIS — E1142 Type 2 diabetes mellitus with diabetic polyneuropathy: Secondary | ICD-10-CM | POA: Diagnosis not present

## 2022-06-05 DIAGNOSIS — M5136 Other intervertebral disc degeneration, lumbar region: Secondary | ICD-10-CM | POA: Diagnosis not present

## 2022-06-05 DIAGNOSIS — B351 Tinea unguium: Secondary | ICD-10-CM | POA: Diagnosis not present

## 2022-06-05 DIAGNOSIS — E7849 Other hyperlipidemia: Secondary | ICD-10-CM | POA: Diagnosis not present

## 2022-06-05 DIAGNOSIS — I10 Essential (primary) hypertension: Secondary | ICD-10-CM | POA: Diagnosis not present

## 2022-07-08 ENCOUNTER — Other Ambulatory Visit: Payer: Self-pay | Admitting: Internal Medicine

## 2022-07-08 DIAGNOSIS — E1142 Type 2 diabetes mellitus with diabetic polyneuropathy: Secondary | ICD-10-CM | POA: Diagnosis not present

## 2022-07-08 DIAGNOSIS — N139 Obstructive and reflux uropathy, unspecified: Secondary | ICD-10-CM | POA: Diagnosis not present

## 2022-07-08 DIAGNOSIS — I10 Essential (primary) hypertension: Secondary | ICD-10-CM | POA: Diagnosis not present

## 2022-07-12 LAB — URINE CULTURE
MICRO NUMBER:: 13631309
SPECIMEN QUALITY:: ADEQUATE

## 2022-08-19 ENCOUNTER — Emergency Department (HOSPITAL_COMMUNITY)
Admission: EM | Admit: 2022-08-19 | Discharge: 2022-08-19 | Disposition: A | Discharge status: Home / Self Care | Payer: Medicare HMO | Attending: Emergency Medicine | Admitting: Emergency Medicine

## 2022-08-19 ENCOUNTER — Encounter (HOSPITAL_COMMUNITY): Payer: Self-pay

## 2022-08-19 ENCOUNTER — Emergency Department (HOSPITAL_COMMUNITY): Payer: Medicare HMO

## 2022-08-19 DIAGNOSIS — Z7984 Long term (current) use of oral hypoglycemic drugs: Secondary | ICD-10-CM | POA: Insufficient documentation

## 2022-08-19 DIAGNOSIS — R109 Unspecified abdominal pain: Secondary | ICD-10-CM | POA: Diagnosis present

## 2022-08-19 DIAGNOSIS — I10 Essential (primary) hypertension: Secondary | ICD-10-CM | POA: Insufficient documentation

## 2022-08-19 DIAGNOSIS — Z7982 Long term (current) use of aspirin: Secondary | ICD-10-CM | POA: Insufficient documentation

## 2022-08-19 DIAGNOSIS — E1165 Type 2 diabetes mellitus with hyperglycemia: Secondary | ICD-10-CM | POA: Diagnosis not present

## 2022-08-19 DIAGNOSIS — R112 Nausea with vomiting, unspecified: Secondary | ICD-10-CM | POA: Diagnosis not present

## 2022-08-19 DIAGNOSIS — N3001 Acute cystitis with hematuria: Secondary | ICD-10-CM | POA: Insufficient documentation

## 2022-08-19 DIAGNOSIS — Z79899 Other long term (current) drug therapy: Secondary | ICD-10-CM | POA: Insufficient documentation

## 2022-08-19 DIAGNOSIS — Z20822 Contact with and (suspected) exposure to covid-19: Secondary | ICD-10-CM | POA: Insufficient documentation

## 2022-08-19 DIAGNOSIS — R1031 Right lower quadrant pain: Secondary | ICD-10-CM | POA: Diagnosis not present

## 2022-08-19 DIAGNOSIS — R1110 Vomiting, unspecified: Secondary | ICD-10-CM | POA: Diagnosis not present

## 2022-08-19 DIAGNOSIS — R059 Cough, unspecified: Secondary | ICD-10-CM | POA: Diagnosis not present

## 2022-08-19 LAB — COMPREHENSIVE METABOLIC PANEL
ALT: 34 U/L (ref 0–44)
AST: 41 U/L (ref 15–41)
Albumin: 4.5 g/dL (ref 3.5–5.0)
Alkaline Phosphatase: 73 U/L (ref 38–126)
Anion gap: 9 (ref 5–15)
BUN: 21 mg/dL (ref 8–23)
CO2: 25 mmol/L (ref 22–32)
Calcium: 9.9 mg/dL (ref 8.9–10.3)
Chloride: 105 mmol/L (ref 98–111)
Creatinine, Ser: 1.16 mg/dL (ref 0.61–1.24)
GFR, Estimated: >60 mL/min (ref >60)
Glucose, Bld: 157 mg/dL — ABNORMAL HIGH (ref 70–99)
Potassium: 4.3 mmol/L (ref 3.5–5.1)
Sodium: 139 mmol/L (ref 135–145)
Total Bilirubin: 0.9 mg/dL (ref 0.3–1.2)
Total Protein: 9.4 g/dL — ABNORMAL HIGH (ref 6.5–8.1)

## 2022-08-19 LAB — URINALYSIS, ROUTINE W REFLEX MICROSCOPIC
Bilirubin Urine: NEGATIVE
Glucose, UA: NEGATIVE mg/dL
Hgb urine dipstick: NEGATIVE
Ketones, ur: NEGATIVE mg/dL
Nitrite: NEGATIVE
Protein, ur: NEGATIVE mg/dL
Specific Gravity, Urine: 1.01 (ref 1.005–1.030)
WBC, UA: >50 WBC/hpf — ABNORMAL HIGH (ref 0–5)
pH: 6 (ref 5.0–8.0)

## 2022-08-19 LAB — CBC
HCT: 48.3 % (ref 39.0–52.0)
Hemoglobin: 14.7 g/dL (ref 13.0–17.0)
MCH: 23 pg — ABNORMAL LOW (ref 26.0–34.0)
MCHC: 30.4 g/dL (ref 30.0–36.0)
MCV: 75.7 fL — ABNORMAL LOW (ref 80.0–100.0)
Platelets: 292 10*3/uL (ref 150–400)
RBC: 6.38 MIL/uL — ABNORMAL HIGH (ref 4.22–5.81)
RDW: 16 % — ABNORMAL HIGH (ref 11.5–15.5)
WBC: 10.2 10*3/uL (ref 4.0–10.5)
nRBC: 0 % (ref 0.0–0.2)

## 2022-08-19 LAB — LIPASE, BLOOD: Lipase: 31 U/L (ref 11–51)

## 2022-08-19 LAB — SARS CORONAVIRUS 2 BY RT PCR: SARS Coronavirus 2 by RT PCR: NEGATIVE

## 2022-08-19 MED ORDER — SODIUM CHLORIDE 0.9 % IV SOLN
2.0000 g | Freq: Once | INTRAVENOUS | Status: AC
  Administered 2022-08-19: 2 g via INTRAVENOUS
  Filled 2022-08-19: qty 20

## 2022-08-19 MED ORDER — FENTANYL CITRATE PF 50 MCG/ML IJ SOSY
25.0000 ug | PREFILLED_SYRINGE | Freq: Once | INTRAMUSCULAR | Status: AC
  Administered 2022-08-19: 25 ug via INTRAVENOUS
  Filled 2022-08-19: qty 1

## 2022-08-19 MED ORDER — LACTATED RINGERS IV BOLUS
1000.0000 mL | Freq: Once | INTRAVENOUS | Status: AC
Start: 2022-08-19 — End: 2022-08-19
  Administered 2022-08-19: 1000 mL via INTRAVENOUS

## 2022-08-19 MED ORDER — SODIUM CHLORIDE (PF) 0.9 % IJ SOLN
INTRAMUSCULAR | Status: AC
  Filled 2022-08-19: qty 50

## 2022-08-19 MED ORDER — IOHEXOL 300 MG/ML  SOLN
100.0000 mL | Freq: Once | INTRAMUSCULAR | Status: AC | PRN
  Administered 2022-08-19: 100 mL via INTRAVENOUS

## 2022-08-19 MED ORDER — CEPHALEXIN 500 MG PO CAPS: 500.0000 mg | ORAL_CAPSULE | Freq: Four times a day (QID) | ORAL | 0 refills | Status: DC

## 2022-08-19 MED ORDER — ONDANSETRON 8 MG PO TBDP: 8.0000 mg | ORAL_TABLET | Freq: Three times a day (TID) | ORAL | 0 refills | Status: AC | PRN

## 2022-08-19 NOTE — Discharge Instructions (Addendum)
On your work-up today, you seem to have a urinary tract infection. You were given a dose of IV antibiotics here in the emergency department. Antibiotics and nausea medication has been sent to your pharmacy. Please pick these up today and take all antibiotics as prescribed. Please use the nausea medicine as you needed. Please drink plenty of fluids Return if you are having worsening symptoms, high fever, or inability to tolerate liquids. Call your doctor for recheck in the next 2 to 4 days Please call urology to schedule outpatient follow-up

## 2022-08-19 NOTE — ED Triage Notes (Signed)
Pt c/o abdominal pain, nausea, and vomiting since this morning. Reports cough for over a week. Denies other associated symptoms.
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
## 2022-08-19 NOTE — ED Notes (Signed)
Patient has a urine culture in the main lab 

## 2022-08-19 NOTE — ED Provider Notes (Signed)
[facility] DEPT Provider Note   CSN: [id number] Arrival date & time: 08/19/22  1059     History  Chief Complaint  Patient presents with   Abdominal Pain   Emesis   Nausea    [name] is a 71 y.o. male.  HPI 71  yo male ho stroke, t2dm, hypertension , uti presents today complaiing of right sided abdominal pain.  Patient states abdominal painbegan 2 days with emesis x 1 today.  Appetite ok, no fever or chills.  Cough began one week ago, npc.      Home Medications Prior to Admission medications   Medication Sig Start Date End Date Taking? Authorizing Provider  cephALEXin (KEFLEX) 500 MG capsule Take 1 capsule (500 mg total) by mouth 4 (four) times daily. 08/19/22  Yes Pattricia Boss, MD  ondansetron (ZOFRAN-ODT) 8 MG disintegrating tablet Take 1 tablet (8 mg total) by mouth every 8 (eight) hours as needed for nausea or vomiting. 08/19/22  Yes Pattricia Boss, MD  albuterol (PROVENTIL HFA;VENTOLIN HFA) 108 (90 Base) MCG/ACT inhaler Inhale 1-2 puffs into the lungs every 6 (six) hours as needed for wheezing. 09/04/16   Tanna Furry, MD  amLODipine (NORVASC) 10 MG tablet Take 1 tablet (10 mg total) by mouth daily. 03/25/22   Regalado, Jerald Kief A, MD  aspirin EC 81 MG tablet Take 81 mg by mouth every morning.    [provider]  diclofenac Sodium (VOLTAREN) 1 % GEL Apply 1 application. topically 4 (four) times daily as needed (pain). 12/27/21   [provider]  diltiazem (CARDIZEM CD) 120 MG 24 hr capsule Take 1 capsule (120 mg total) by mouth daily. 03/25/22 03/25/23  Regalado, Belkys A, MD  doxazosin (CARDURA) 4 MG tablet Take 1 tablet (4 mg total) by mouth daily. 03/25/22   Regalado, Belkys A, MD  hydrALAZINE (APRESOLINE) 100 MG tablet Take 1 tablet (100 mg total) by mouth every 8 (eight) hours. 03/25/22   Regalado, Belkys A, MD  losartan (COZAAR) 100 MG tablet Take 1 tablet (100 mg total) by mouth daily. 03/25/22   Regalado, Belkys A, MD   metFORMIN (GLUCOPHAGE) 500 MG tablet Take 1 tablet by mouth twice a day Patient taking differently: Take 500 mg by mouth 2 (two) times daily. 06/04/21     nicotine (NICODERM CQ - DOSED IN MG/24 HOURS) 21 mg/24hr patch Place 1 patch (21 mg total) onto the skin daily. Patient taking differently: Place 21 mg onto the skin daily as needed (smoking cessation). 02/21/20   Eugenie Filler, MD  pantoprazole (PROTONIX) 40 MG tablet Take 1 tablet (40 mg total) by mouth daily. Patient not taking: Reported on 03/23/2022 02/20/20   Eugenie Filler, MD  pravastatin (PRAVACHOL) 20 MG tablet Take 20 mg by mouth in the morning. 02/18/22   [provider]  tamsulosin (FLOMAX) 0.4 MG CAPS capsule TAKE TWO CAPSULES BY MOUTH AT BEDTIME 03/25/22   Regalado, Belkys A, MD  traMADol (ULTRAM) 50 MG tablet Take 1 tablet by mouth twice a day as needed for severe pains Patient taking differently: Take 50 mg by mouth 2 (two) times daily as needed for severe pain. 06/04/21         Allergies    Patient has no known allergies.    Review of Systems   Review of Systems  Physical Exam Updated Vital Signs BP (!) 183/47   Pulse (!) 55   Temp 98.5 F (36.9 C) (Oral)   Resp 16   Ht  1.753 m ('5\' 9"'$ )   Wt 74.8 kg   SpO2 99%   BMI 24.37 kg/m  Physical Exam Vitals and nursing note reviewed.  Constitutional:      Appearance: He is well-developed.  HENT:     Head: Normocephalic.     Mouth/Throat:     Mouth: Mucous membranes are moist.  Eyes:     Extraocular Movements: Extraocular movements intact.  Cardiovascular:     Rate and Rhythm: Normal rate and regular rhythm.  Abdominal:     General: Abdomen is protuberant. Bowel sounds are normal.     Palpations: Abdomen is soft.     Tenderness: There is abdominal tenderness in the right upper quadrant and right lower quadrant.     Hernia: A hernia is present.  Neurological:     Mental Status: He is alert.     ED Results / Procedures / Treatments   Labs (all  labs ordered are listed, but only abnormal results are displayed) Labs Reviewed  COMPREHENSIVE METABOLIC PANEL - Abnormal; Notable for the following components:      Result Value   Glucose, Bld 157 (*)    Total Protein 9.4 (*)    All other components within normal limits  CBC - Abnormal; Notable for the following components:   RBC 6.38 (*)    MCV 75.7 (*)    MCH 23.0 (*)    RDW 16.0 (*)    All other components within normal limits  URINALYSIS, ROUTINE W REFLEX MICROSCOPIC - Abnormal; Notable for the following components:   APPearance HAZY (*)    Leukocytes,Ua LARGE (*)    WBC, UA >50 (*)    Bacteria, UA RARE (*)    All other components within normal limits  SARS CORONAVIRUS 2 BY RT PCR  URINE CULTURE  LIPASE, BLOOD    EKG EKG Interpretation  Date/Time:  Tuesday August 19 2022 12:20:11 EDT Ventricular Rate:  58 PR Interval:  157 QRS Duration: 94 QT Interval:  498 QTC Calculation: 490 R Axis:   1 Text Interpretation: Sinus rhythm Probable left atrial enlargement Anteroseptal infarct, old Non-specific ST-t changes Confirmed by Pattricia Boss [phone] on 08/19/2022 12:22:58 PM  Radiology CT ABDOMEN PELVIS W CONTRAST  Result Date: 08/19/2022 CLINICAL DATA:  RIGHT lower quadrant pain.  Nausea and vomiting EXAM: CT ABDOMEN AND PELVIS WITH CONTRAST TECHNIQUE: Multidetector CT imaging of the abdomen and pelvis was performed using the standard protocol following bolus administration of intravenous contrast. RADIATION DOSE REDUCTION: This exam was performed according to the departmental dose-optimization program which includes automated exposure control, adjustment of the mA and/or kV according to patient size and/or use of iterative reconstruction technique. CONTRAST:  138m OMNIPAQUE IOHEXOL 300 MG/ML  SOLN COMPARISON:  CT 04/01/2022 FINDINGS: Lower chest: Lung bases are clear. Hepatobiliary: No focal hepatic lesion. No biliary duct dilatation. Common bile duct is normal. Pancreas:  Pancreas is normal. No ductal dilatation. No pancreatic inflammation. Spleen: Normal spleen Adrenals/urinary tract: Adrenal glands normal. Kidneys ureters are normal. Bladder is thick-walled. 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 Smaller diverticulum posterior LEFT measuring 3.2 cm (image 70/2). Findings similar to comparison CT with some improvement in wall thickening. Stomach/Bowel: Small hiatal hernia. Stomach, small bowel, appendix, and cecum are normal. Multiple diverticula of the descending colon and sigmoid colon without acute inflammation. Vascular/Lymphatic: Abdominal aorta is normal caliber with atherosclerotic calcification. There is no retroperitoneal or periportal lymphadenopathy. No pelvic lymphadenopathy. Reproductive:  Prostate unremarkable Other: No free fluid. Musculoskeletal: No aggressive osseous lesion. IMPRESSION: 1. No explanation for nausea and vomiting. 2. Normal appendix and gallbladder. 3. Thick-walled bladder with a large RIGHT anterior bladder diverticulum. Smaller diverticulum posterior LEFT. No bladder calculi or obstructing lesion identified. The bladder wall thickening is improved from comparison CT. Electronically Signed   By: Suzy Bouchard M.D.   On: 08/19/2022 15:11   DG Chest Port 1 View  Result Date: 08/19/2022 CLINICAL DATA:  Cough EXAM: PORTABLE CHEST 1 VIEW COMPARISON:  04/07/2021 FINDINGS: The cardiomediastinal silhouette is unremarkable. Mild peribronchial thickening and elevation of the RIGHT hemidiaphragm again noted. There is no evidence of focal airspace disease, pulmonary edema, suspicious pulmonary nodule/mass, pleural effusion, or pneumothorax. No acute bony abnormalities are identified. IMPRESSION: No evidence of acute cardiopulmonary disease. Electronically Signed   By: Margarette Canada M.D.   On: 08/19/2022 12:08    Procedures Procedures    Medications Ordered in ED Medications  sodium  chloride (PF) 0.9 % injection (has no administration in time range)  lactated ringers bolus 1,000 mL (1,000 mLs Intravenous New Bag/Given 08/19/22 1312)  fentaNYL (SUBLIMAZE) injection 25 mcg (25 mcg Intravenous Given 08/19/22 1313)  iohexol (OMNIPAQUE) 300 MG/ML solution 100 mL (100 mLs Intravenous Contrast Given 08/19/22 1453)  cefTRIAXone (ROCEPHIN) 2 g in sodium chloride 0.9 % 100 mL IVPB (2 g Intravenous New Bag/Given 08/19/22 1515)    ED Course/ Medical Decision Making/ A&P Clinical Course as of 08/19/22 1551  Tue Aug 19, 2022  1334 CBC reviewed and interpreted normal white count, normal hemoglobin, some decreased MCV [DR]  1334 Chest x-Roth Ress reviewed interpreted no evidence of acute normality radiologist interpretation concurs [DR]  2035 Complete metabolic panel reviewed interpreted sever hyperglycemia 157 [DR]  1424 CBC(!) CBC reviewed interpreted no acute abnormalities COVID test reviewed interpreted normal Lipase reviewed interpreted normal [DR]  1425 Chest x-Dariella Gillihan reviewed interpreted no evidence of acute abnormality noted [DR]  5974 CBC reviewed interpreted and significant for rare bacteria with greater than 50 white blood cells and large leukocytes [DR]  1638 CT reviewed interpreted and normal appendix and gallbladder noted with thickened wall bladder within the large right anterior bladder diverticulum.  Bladder wall thickening is improved from comparison CT. [DR]    Clinical Course User Index [DR] Pattricia Boss, MD                           Medical Decision Making 71 year old male with history of UTI presents today with some abdominal pain.  He has had some subjective fever.  He has been able to take p.o. well with the exception of 1 episode of emesis today. Patient has some right-sided abdominal tenderness to palpation which was mild to moderate. Patient evaluated here with labs and does not appear to have acute kidney infection, leukocytosis, or other acute metabolic  abnormalities with some mild hyperglycemia with a sugar of 157 Urinalysis reviewed and significant for greater than 50 white blood cells  Amount and/or Complexity of Data Reviewed Labs: ordered. Decision-making details documented in ED Course. Radiology: ordered.  Risk Prescription drug management.           Final Clinical Impression(s) / ED Diagnoses Final diagnoses:  Acute cystitis with hematuria    Rx / DC Orders ED Discharge Orders          Ordered    ondansetron (ZOFRAN-ODT) 8 MG disintegrating tablet  Every 8 hours PRN  08/19/22 1515    cephALEXin (KEFLEX) 500 MG capsule  4 times daily        08/19/22 1515              Pattricia Boss, MD 08/19/22 1551

## 2022-08-21 LAB — URINE CULTURE: Culture: >=100000 — AB

## 2022-08-22 ENCOUNTER — Telehealth: Payer: Self-pay

## 2022-08-22 NOTE — Telephone Encounter (Signed)
Post ED Visit - Positive Culture Follow-up  Culture report reviewed by antimicrobial stewardship pharmacist: [facility] Team '[]'$  Elenor Quinones, Pharm.D. '[]'$  Heide Guile, Pharm.D., BCPS AQ-ID '[]'$  Parks Neptune, Pharm.D., BCPS '[]'$  Alycia Rossetti, Pharm.D., BCPS '[]'$  [place], Pharm.D., BCPS, AAHIVP '[]'$  Legrand Como, Pharm.D., BCPS, AAHIVP '[]'$  Salome Arnt, PharmD, BCPS '[]'$  Johnnette Gourd, PharmD, BCPS '[]'$  Hughes Better, PharmD, BCPS '[]'$  Leeroy Cha, PharmD '[]'$  Laqueta Linden, PharmD, BCPS '[]'$  Albertina Parr, PharmD  [facility] Team '[x]'$  Jimmy Footman, PharmD '[]'$  Lindell Spar, PharmD '[]'$  Royetta Asal, PharmD '[]'$  Graylin Shiver, Rph '[]'$  Rema Fendt) Glennon Mac, PharmD '[]'$  Arlyn Dunning, PharmD '[]'$  Netta Cedars, PharmD '[]'$  Dia Sitter, PharmD '[]'$  Leone Haven, PharmD '[]'$  Gretta Arab, PharmD '[]'$  Theodis Shove, PharmD '[]'$  Peggyann Juba, PharmD '[]'$  Reuel Boom, PharmD   Positive urine culture Treated with Cephalexin, organism sensitive to the same and no further patient follow-up is required at this time.  Glennon Hamilton 08/22/2022, 12:44 PM

## 2022-10-16 DIAGNOSIS — E7849 Other hyperlipidemia: Secondary | ICD-10-CM | POA: Diagnosis not present

## 2022-10-16 DIAGNOSIS — Z0000 Encounter for general adult medical examination without abnormal findings: Secondary | ICD-10-CM | POA: Diagnosis not present

## 2022-10-16 DIAGNOSIS — I10 Essential (primary) hypertension: Secondary | ICD-10-CM | POA: Diagnosis not present

## 2022-10-16 DIAGNOSIS — Z23 Encounter for immunization: Secondary | ICD-10-CM | POA: Diagnosis not present

## 2022-10-16 DIAGNOSIS — G894 Chronic pain syndrome: Secondary | ICD-10-CM | POA: Diagnosis not present

## 2022-10-16 DIAGNOSIS — E1142 Type 2 diabetes mellitus with diabetic polyneuropathy: Secondary | ICD-10-CM | POA: Diagnosis not present

## 2022-11-10 ENCOUNTER — Emergency Department (HOSPITAL_COMMUNITY)
Admission: EM | Admit: 2022-11-10 | Discharge: 2022-11-10 | Discharge status: Against Medical Advice | Payer: Medicare HMO | Source: Home / Self Care

## 2022-11-14 ENCOUNTER — Telehealth: Payer: Self-pay

## 2022-11-14 NOTE — Telephone Encounter (Signed)
     Patient  visit on 11/13  at [facility]   Have you been able to follow up with your primary care physician? Yes   The patient was or was not able to obtain any needed medicine or equipment. Na   Are there diet recommendations that you are having difficulty following? Na   Patient expresses understanding of discharge instructions and education provided has no other needs at this time.  Yes    [place], [facility], Care Management  [phone] [address], [place], [place] [address] Phone: [phone] Email: [email or web address]

## 2022-12-14 ENCOUNTER — Encounter (HOSPITAL_COMMUNITY): Payer: Self-pay

## 2022-12-14 ENCOUNTER — Emergency Department (HOSPITAL_COMMUNITY): Payer: Medicare HMO

## 2022-12-14 ENCOUNTER — Other Ambulatory Visit: Payer: Self-pay

## 2022-12-14 ENCOUNTER — Emergency Department (HOSPITAL_COMMUNITY)
Admission: EM | Admit: 2022-12-14 | Discharge: 2022-12-14 | Disposition: A | Discharge status: Home / Self Care | Payer: Medicare HMO | Attending: Emergency Medicine | Admitting: Emergency Medicine

## 2022-12-14 DIAGNOSIS — N323 Diverticulum of bladder: Secondary | ICD-10-CM | POA: Diagnosis not present

## 2022-12-14 DIAGNOSIS — Z7982 Long term (current) use of aspirin: Secondary | ICD-10-CM | POA: Diagnosis not present

## 2022-12-14 DIAGNOSIS — M47816 Spondylosis without myelopathy or radiculopathy, lumbar region: Secondary | ICD-10-CM | POA: Diagnosis not present

## 2022-12-14 DIAGNOSIS — R1013 Epigastric pain: Secondary | ICD-10-CM | POA: Diagnosis present

## 2022-12-14 DIAGNOSIS — I10 Essential (primary) hypertension: Secondary | ICD-10-CM | POA: Insufficient documentation

## 2022-12-14 DIAGNOSIS — Z7984 Long term (current) use of oral hypoglycemic drugs: Secondary | ICD-10-CM | POA: Diagnosis not present

## 2022-12-14 DIAGNOSIS — R0789 Other chest pain: Secondary | ICD-10-CM | POA: Diagnosis not present

## 2022-12-14 DIAGNOSIS — N3289 Other specified disorders of bladder: Secondary | ICD-10-CM | POA: Diagnosis not present

## 2022-12-14 DIAGNOSIS — K449 Diaphragmatic hernia without obstruction or gangrene: Secondary | ICD-10-CM

## 2022-12-14 DIAGNOSIS — R319 Hematuria, unspecified: Secondary | ICD-10-CM | POA: Diagnosis not present

## 2022-12-14 DIAGNOSIS — Z79899 Other long term (current) drug therapy: Secondary | ICD-10-CM | POA: Diagnosis not present

## 2022-12-14 DIAGNOSIS — R778 Other specified abnormalities of plasma proteins: Secondary | ICD-10-CM | POA: Diagnosis not present

## 2022-12-14 DIAGNOSIS — J439 Emphysema, unspecified: Secondary | ICD-10-CM | POA: Diagnosis not present

## 2022-12-14 DIAGNOSIS — N3001 Acute cystitis with hematuria: Secondary | ICD-10-CM | POA: Diagnosis not present

## 2022-12-14 DIAGNOSIS — R079 Chest pain, unspecified: Secondary | ICD-10-CM | POA: Diagnosis not present

## 2022-12-14 DIAGNOSIS — K5730 Diverticulosis of large intestine without perforation or abscess without bleeding: Secondary | ICD-10-CM | POA: Diagnosis not present

## 2022-12-14 DIAGNOSIS — I2510 Atherosclerotic heart disease of native coronary artery without angina pectoris: Secondary | ICD-10-CM | POA: Diagnosis not present

## 2022-12-14 LAB — URINALYSIS, ROUTINE W REFLEX MICROSCOPIC
Bilirubin Urine: NEGATIVE
Glucose, UA: NEGATIVE mg/dL
Ketones, ur: NEGATIVE mg/dL
Leukocytes,Ua: NEGATIVE
Nitrite: NEGATIVE
Protein, ur: 100 mg/dL — AB
RBC / HPF: >50 RBC/hpf — ABNORMAL HIGH (ref 0–5)
Specific Gravity, Urine: 1.014 (ref 1.005–1.030)
pH: 8 (ref 5.0–8.0)

## 2022-12-14 LAB — TROPONIN I (HIGH SENSITIVITY)
Troponin I (High Sensitivity): 19 ng/L — ABNORMAL HIGH (ref <18)
Troponin I (High Sensitivity): 32 ng/L — ABNORMAL HIGH (ref <18)

## 2022-12-14 LAB — COMPREHENSIVE METABOLIC PANEL
ALT: 28 U/L (ref 0–44)
AST: 41 U/L (ref 15–41)
Albumin: 3.9 g/dL (ref 3.5–5.0)
Alkaline Phosphatase: 52 U/L (ref 38–126)
Anion gap: 12 (ref 5–15)
BUN: 38 mg/dL — ABNORMAL HIGH (ref 8–23)
CO2: 19 mmol/L — ABNORMAL LOW (ref 22–32)
Calcium: 9.4 mg/dL (ref 8.9–10.3)
Chloride: 105 mmol/L (ref 98–111)
Creatinine, Ser: 1.03 mg/dL (ref 0.61–1.24)
GFR, Estimated: >60 mL/min (ref >60)
Glucose, Bld: 168 mg/dL — ABNORMAL HIGH (ref 70–99)
Potassium: 3.4 mmol/L — ABNORMAL LOW (ref 3.5–5.1)
Sodium: 136 mmol/L (ref 135–145)
Total Bilirubin: 0.9 mg/dL (ref 0.3–1.2)
Total Protein: 7.8 g/dL (ref 6.5–8.1)

## 2022-12-14 LAB — ETHANOL: Alcohol, Ethyl (B): <10 mg/dL (ref <10)

## 2022-12-14 LAB — CBC
HCT: 43 % (ref 39.0–52.0)
Hemoglobin: 13.5 g/dL (ref 13.0–17.0)
MCH: 23.2 pg — ABNORMAL LOW (ref 26.0–34.0)
MCHC: 31.4 g/dL (ref 30.0–36.0)
MCV: 73.8 fL — ABNORMAL LOW (ref 80.0–100.0)
Platelets: 304 10*3/uL (ref 150–400)
RBC: 5.83 MIL/uL — ABNORMAL HIGH (ref 4.22–5.81)
RDW: 14.9 % (ref 11.5–15.5)
WBC: 11.6 10*3/uL — ABNORMAL HIGH (ref 4.0–10.5)
nRBC: 0 % (ref 0.0–0.2)

## 2022-12-14 LAB — PROTIME-INR
INR: 1 (ref 0.8–1.2)
Prothrombin Time: 13.1 seconds (ref 11.4–15.2)

## 2022-12-14 LAB — LIPASE, BLOOD: Lipase: 44 U/L (ref 11–51)

## 2022-12-14 LAB — LACTIC ACID, PLASMA: Lactic Acid, Venous: 0.8 mmol/L (ref 0.5–1.9)

## 2022-12-14 MED ORDER — PANTOPRAZOLE SODIUM 40 MG IV SOLR
40.0000 mg | Freq: Once | INTRAVENOUS | Status: AC
Start: 2022-12-14 — End: 2022-12-14
  Administered 2022-12-14: 40 mg via INTRAVENOUS
  Filled 2022-12-14: qty 10

## 2022-12-14 MED ORDER — HYDROMORPHONE HCL 1 MG/ML IJ SOLN
1.0000 mg | Freq: Once | INTRAMUSCULAR | Status: AC
  Administered 2022-12-14: 1 mg via INTRAVENOUS
  Filled 2022-12-14: qty 1

## 2022-12-14 MED ORDER — ONDANSETRON HCL 4 MG/2ML IJ SOLN
4.0000 mg | Freq: Once | INTRAMUSCULAR | Status: AC
  Administered 2022-12-14: 4 mg via INTRAVENOUS
  Filled 2022-12-14: qty 2

## 2022-12-14 MED ORDER — OMEPRAZOLE 20 MG PO CPDR: 20.0000 mg | DELAYED_RELEASE_CAPSULE | Freq: Every day | ORAL | 0 refills | Status: AC

## 2022-12-14 MED ORDER — ALUM & MAG HYDROXIDE-SIMETH 200-200-20 MG/5ML PO SUSP
30.0000 mL | Freq: Once | ORAL | Status: AC
  Administered 2022-12-14: 30 mL via ORAL
  Filled 2022-12-14: qty 30

## 2022-12-14 MED ORDER — SUCRALFATE 1 GM/10ML PO SUSP: 1.0000 g | Freq: Three times a day (TID) | ORAL | 0 refills | Status: DC

## 2022-12-14 MED ORDER — CEPHALEXIN 500 MG PO CAPS: 1000.0000 mg | ORAL_CAPSULE | Freq: Two times a day (BID) | ORAL | 0 refills | Status: DC

## 2022-12-14 MED ORDER — SODIUM CHLORIDE 0.9 % IV BOLUS
500.0000 mL | Freq: Once | INTRAVENOUS | Status: AC
  Administered 2022-12-14: 500 mL via INTRAVENOUS

## 2022-12-14 MED ORDER — IOHEXOL 350 MG/ML SOLN
100.0000 mL | Freq: Once | INTRAVENOUS | Status: AC | PRN
  Administered 2022-12-14: 100 mL via INTRAVENOUS

## 2022-12-14 NOTE — ED Provider Notes (Signed)
[facility] DEPT Provider Note   CSN: [id number] Arrival date & time: 12/14/22  0507     History  Chief Complaint  Patient presents with   Hematuria   Chest Pain    [name] is a 71 y.o. male.  HPI Patient reports that he vomited quite forcefully twice yesterday.  He reports that he started getting severe chest and abdominal pain yesterday evening.  Pain goes from his central abdomen and epigastrium up through the center of the chest.  He reports is both sharp and burning in quality.  He denies radiation into his back.  He does report that the pain started after the episode of vomiting.  Patient reports that he has been taking some Goody powders.  Reports he is also now noted blood in the urine.  Denies any diarrhea or constipation.  He reports he had 2 normal bowel movements yesterday.  Patient denies alcohol use.  At this time he denies known prior history of GI bleed or ulcer.    Home Medications Prior to Admission medications   Medication Sig Start Date End Date Taking? Authorizing Provider  cephALEXin (KEFLEX) 500 MG capsule Take 2 capsules (1,000 mg total) by mouth 2 (two) times daily. 12/14/22  Yes Charlesetta Shanks, MD  omeprazole (PRILOSEC) 20 MG capsule Take 1 capsule (20 mg total) by mouth daily. 12/14/22  Yes Charlesetta Shanks, MD  sucralfate (CARAFATE) 1 GM/10ML suspension Take 10 mLs (1 g total) by mouth 4 (four) times daily -  with meals and at bedtime. 12/14/22  Yes Charlesetta Shanks, MD  albuterol (PROVENTIL HFA;VENTOLIN HFA) 108 (90 Base) MCG/ACT inhaler Inhale 1-2 puffs into the lungs every 6 (six) hours as needed for wheezing. 09/04/16   Tanna Furry, MD  amLODipine (NORVASC) 10 MG tablet Take 1 tablet (10 mg total) by mouth daily. 03/25/22   Regalado, Jerald Kief A, MD  aspirin EC 81 MG tablet Take 81 mg by mouth every morning.    [provider]  cephALEXin (KEFLEX) 500 MG capsule Take 1 capsule (500 mg total) by mouth 4 (four)  times daily. 08/19/22   Pattricia Boss, MD  diclofenac Sodium (VOLTAREN) 1 % GEL Apply 1 application. topically 4 (four) times daily as needed (pain). 12/27/21   [provider]  diltiazem (CARDIZEM CD) 120 MG 24 hr capsule Take 1 capsule (120 mg total) by mouth daily. 03/25/22 03/25/23  Regalado, Belkys A, MD  doxazosin (CARDURA) 4 MG tablet Take 1 tablet (4 mg total) by mouth daily. 03/25/22   Regalado, Belkys A, MD  hydrALAZINE (APRESOLINE) 100 MG tablet Take 1 tablet (100 mg total) by mouth every 8 (eight) hours. 03/25/22   Regalado, Belkys A, MD  losartan (COZAAR) 100 MG tablet Take 1 tablet (100 mg total) by mouth daily. 03/25/22   Regalado, Belkys A, MD  metFORMIN (GLUCOPHAGE) 500 MG tablet Take 1 tablet by mouth twice a day Patient taking differently: Take 500 mg by mouth 2 (two) times daily. 06/04/21     nicotine (NICODERM CQ - DOSED IN MG/24 HOURS) 21 mg/24hr patch Place 1 patch (21 mg total) onto the skin daily. Patient taking differently: Place 21 mg onto the skin daily as needed (smoking cessation). 02/21/20   Eugenie Filler, MD  ondansetron (ZOFRAN-ODT) 8 MG disintegrating tablet Take 1 tablet (8 mg total) by mouth every 8 (eight) hours as needed for nausea or vomiting. 08/19/22   Pattricia Boss, MD  pantoprazole (PROTONIX) 40 MG tablet Take 1 tablet (40  mg total) by mouth daily. Patient not taking: Reported on 03/23/2022 02/20/20   Eugenie Filler, MD  pravastatin (PRAVACHOL) 20 MG tablet Take 20 mg by mouth in the morning. 02/18/22   [provider]  tamsulosin (FLOMAX) 0.4 MG CAPS capsule TAKE TWO CAPSULES BY MOUTH AT BEDTIME 03/25/22   Regalado, Belkys A, MD  traMADol (ULTRAM) 50 MG tablet Take 1 tablet by mouth twice a day as needed for severe pains Patient taking differently: Take 50 mg by mouth 2 (two) times daily as needed for severe pain. 06/04/21         Allergies    Patient has no known allergies.    Review of Systems   Review of Systems  Physical  Exam Updated Vital Signs BP (!) 154/77   Pulse 71   Temp 99.5 F (37.5 C) (Oral)   Resp 12   Ht '5\' 9"'$  (1.753 m)   Wt 74.8 kg   SpO2 97%   BMI 24.37 kg/m  Physical Exam Constitutional:      Comments: Patient is very uncomfortable in appearance.  Situationally alert, no respiratory distress.  HENT:     Head: Normocephalic and atraumatic.     Mouth/Throat:     Pharynx: Oropharynx is clear.     Comments: Poor dentition. Cardiovascular:     Rate and Rhythm: Normal rate and regular rhythm.  Pulmonary:     Effort: Pulmonary effort is normal.     Breath sounds: Normal breath sounds.  Abdominal:     Comments: Abdomen mildly distended.  Patient endorses significant pain to palpation in the upper central abdomen.  No palpable mass.  No guarding.  Musculoskeletal:        General: No swelling or tenderness. Normal range of motion.     Right lower leg: No edema.     Left lower leg: No edema.  Skin:    General: Skin is warm and dry.  Neurological:     General: No focal deficit present.     Mental Status: He is oriented to person, place, and time.     ED Results / Procedures / Treatments   Labs (all labs ordered are listed, but only abnormal results are displayed) Labs Reviewed  CBC - Abnormal; Notable for the following components:      Result Value   WBC 11.6 (*)    RBC 5.83 (*)    MCV 73.8 (*)    MCH 23.2 (*)    All other components within normal limits  URINALYSIS, ROUTINE W REFLEX MICROSCOPIC - Abnormal; Notable for the following components:   Color, Urine RED (*)    APPearance CLOUDY (*)    Hgb urine dipstick MODERATE (*)    Protein, ur 100 (*)    RBC / HPF >50 (*)    Bacteria, UA MANY (*)    All other components within normal limits  COMPREHENSIVE METABOLIC PANEL - Abnormal; Notable for the following components:   Potassium 3.4 (*)    CO2 19 (*)    Glucose, Bld 168 (*)    BUN 38 (*)    All other components within normal limits  TROPONIN I (HIGH SENSITIVITY) -  Abnormal; Notable for the following components:   Troponin I (High Sensitivity) 19 (*)    All other components within normal limits  TROPONIN I (HIGH SENSITIVITY) - Abnormal; Notable for the following components:   Troponin I (High Sensitivity) 32 (*)    All other components within normal limits  URINE  CULTURE  LIPASE, BLOOD  ETHANOL  PROTIME-INR  LACTIC ACID, PLASMA    EKG EKG Interpretation  Date/Time:  Sunday December 14 2022 05:29:49 EST Ventricular Rate:  88 PR Interval:  172 QRS Duration: 86 QT Interval:  383 QTC Calculation: 464 R Axis:   -40 Text Interpretation: Age not entered, assumed to be  71 years old for purpose of ECG interpretation Sinus rhythm Left atrial enlargement Left axis deviation Probable anteroseptal infarct, old Minimal ST depression, lateral leads agree, no acute ischemic appearance. normalization of biphasic t wave compared to previous Confirmed by Charlesetta Shanks [id number]) on 12/14/2022 7:55:22 AM  Radiology CT Angio Chest/Abd/Pel for Dissection W and/or W/WO  Result Date: 12/14/2022 CLINICAL DATA:  Chest pain, clinical suspicion of acute aortic syndrome EXAM: CT ANGIOGRAPHY CHEST, ABDOMEN AND PELVIS TECHNIQUE: Non-contrast CT of the chest was initially obtained. Multidetector CT imaging through the chest, abdomen and pelvis was performed using the standard protocol during bolus administration of intravenous contrast. Multiplanar reconstructed images and MIPs were obtained and reviewed to evaluate the vascular anatomy. RADIATION DOSE REDUCTION: This exam was performed according to the departmental dose-optimization program which includes automated exposure control, adjustment of the mA and/or kV according to patient size and/or use of iterative reconstruction technique. CONTRAST:  180m OMNIPAQUE IOHEXOL 350 MG/ML SOLN COMPARISON:  Previous studies including the CT abdomen and pelvis done on 08/19/2022 and CT chest, abdomen and pelvis done on 04/01/2022  FINDINGS: CTA CHEST FINDINGS Cardiovascular: There is no demonstrable mural hematoma in the noncontrast images. There are scattered coarse calcifications in the thoracic and abdominal aorta. Coronary artery calcifications are seen. There is homogeneous enhancement in thoracic aorta. There are no intraluminal filling defects in central pulmonary artery branches. Mediastinum/Nodes: No significant lymphadenopathy is seen. Lungs/Pleura: There is no focal pulmonary consolidation. Blebs and bullae are seen in upper lung fields, more so on the right side. There is no pleural effusion or pneumothorax. Musculoskeletal: No acute findings are seen in bony structures in the thorax. Review of the MIP images confirms the above findings. CTA ABDOMEN AND PELVIS FINDINGS VASCULAR Aorta: There is no dissection or focal aneurysmal dilation. Atherosclerotic plaques and calcifications are seen. Celiac: There are atherosclerotic plaques and calcifications in the proximal course without significant stenosis SMA: Atherosclerotic plaques and calcifications are seen in proximal course without high-grade stenosis Renals: There are coarse calcifications in the proximal course of both renal arteries with no significant stenosis. IMA: Patent. Iliacs: Atherosclerotic plaques and calcifications are seen with mild to moderate narrowing in iliac arteries on both sides. Veins: Unremarkable. Review of the MIP images confirms the above findings. NON-VASCULAR Hepatobiliary: There is subcentimeter low-density in the left lobe with no significant change, possibly cyst or hemangioma. There is decreased density in liver in comparison to the spleen suggesting possible fatty infiltration. There is no dilation of bile ducts. Gallbladder is unremarkable. Pancreas: No focal abnormalities are seen. Spleen: Unremarkable. Adrenals/Urinary Tract: Adrenals are unremarkable. There is no hydronephrosis. There are no renal or ureteral stones. There is a large  diverticulum in the right side of the urinary bladder dome. The smaller diverticulum in the lateral margin of the urinary bladder. There is diffuse wall thickening in the urinary bladder which has not changed significantly. Stomach/Bowel: Small hiatal hernia is seen. Stomach is not distended. Small bowel loops are not dilated. Appendix is not dilated. Scattered diverticula are seen in colon. There are no signs of focal acute diverticulitis. Lymphatic: No new significant lymphadenopathy is seen. Reproductive: Unremarkable. Other: There  is no ascites or pneumoperitoneum. Small left inguinal hernia containing fat is seen. Musculoskeletal: No acute findings are seen. Degenerative changes are noted in lumbar spine, more so at L3-L4 level. Few small scattered sclerotic densities in the vertebral bodies appears stable, possibly benign bone islands. Review of the MIP images confirms the above findings. IMPRESSION: There is no evidence of dissection or focal aneurysmal dilation in thoracic and abdominal aorta. There are scattered atherosclerotic plaques and calcifications in thoracic and abdominal aorta. There is no evidence of central pulmonary embolism. There is no focal pulmonary consolidation. There is no evidence of intestinal obstruction or pneumoperitoneum. There is no hydronephrosis. Appendix is not dilated. Diverticulosis of colon. Small hiatal hernia. Diffuse wall thickening in the urinary bladder may be due to chronic outlet obstruction or chronic cystitis. Diverticula are seen in both sides of urinary bladder, larger one in the right side of fundus. Other findings as described in the body of the report. Electronically Signed   By: Elmer Picker M.D.   On: 12/14/2022 11:39   DG Chest 2 View  Result Date: 12/14/2022 CLINICAL DATA:  71 year old male with chest pain acute onset this morning. Gross hematuria. EXAM: CHEST - 2 VIEW COMPARISON:  Portable chest 08/19/2022 and earlier. FINDINGS: Semi upright AP  and lateral views at 0654 hours. Lung volumes and mediastinal contours are stable since 2021. Borderline to mild cardiomegaly. Visualized tracheal air column is within normal limits. Mild chronic increased interstitial markings appear stable. No pneumothorax, pulmonary edema, pleural effusion or acute pulmonary opacity. No acute osseous abnormality identified. Abdominal Calcified aortic atherosclerosis. Negative visible bowel gas. IMPRESSION: 1. No acute cardiopulmonary abnormality. 2. Mild chronic pulmonary interstitial changes and cardiomegaly. Aortic Atherosclerosis (ICD10-I70.0). Electronically Signed   By: Genevie Ann M.D.   On: 12/14/2022 07:05    Procedures Procedures    Medications Ordered in ED Medications  sodium chloride 0.9 % bolus 500 mL (0 mLs Intravenous Stopped 12/14/22 1155)  pantoprazole (PROTONIX) injection 40 mg (40 mg Intravenous Given 12/14/22 1054)  ondansetron (ZOFRAN) injection 4 mg (4 mg Intravenous Given 12/14/22 1053)  HYDROmorphone (DILAUDID) injection 1 mg (1 mg Intravenous Given 12/14/22 1053)  iohexol (OMNIPAQUE) 350 MG/ML injection 100 mL (100 mLs Intravenous Contrast Given 12/14/22 1101)  HYDROmorphone (DILAUDID) injection 1 mg (1 mg Intravenous Given 12/14/22 1536)  alum & mag hydroxide-simeth (MAALOX/MYLANTA) 200-200-20 MG/5ML suspension 30 mL (30 mLs Oral Given 12/14/22 1536)    ED Course/ Medical Decision Making/ A&P                           Medical Decision Making Amount and/or Complexity of Data Reviewed Labs: ordered. Radiology: ordered.  Risk OTC drugs. Prescription drug management.   Patient presents as outlined with severe epigastric and abdominal pain with associated chest pain.  He also reports hematuria.  He describes the pain as starting after having vomited twice yesterday.  At this time differential diagnosis includes aortic dissection\perforated ulcer\pancreatitis\Boerhaave's esophageal tear.  Hematuria differential diagnosis bleeding  disorder\UTI\kidney stone\focal bladder wall anomaly.  At this time we will proceed with diagnostic imaging to include dissection study to rule out aortic dissection and evaluate for other intrathoracic and intra-abdominal etiologies.  Chest x-ray reviewed and independently evaluated by myself.  No free air under the diaphragm, no mediastinal widening.  EKG reviewed by myself no acute MI or acute ischemic appearance.  CBC returned with H&H within normal limits.  No immediate appearance of anemia.  Will plan  for treatment initiation with Protonix, Dilaudid, Zofran and fluid resuscitation.  CT dissection study has ruled out dissection or aneurysm.  Note is made of hiatal hernia present.  Also notation made regarding bladder wall thickening and diverticulum.  No emergent findings identified.  Patient arrived with hypertension.  With pain control blood pressures have spontaneously been trending downward.  At time of discharge last pressures at 154/77.  Patient mild troponin elevation that went from 19-32.  EKG does not show ischemic changes.  At this time low patient for ACS as etiology of patient's presentation.  Presentation was significantly for abdominal pain that was lower as well as epigastric and very reproducible.  Symptoms have now completely resolved with Protonix, Zofran and Dilaudid.   At this time with diagnostic evaluation complete I do feel patient stable for discharge.  Regarding hematuria, patient reports that he has been under surveillance by [facility] for his bladder thickening.  We reviewed the necessity to schedule a follow-up and recheck given that he has now had some bleeding.  We will empirically treat with Keflex and get a urine culture.  We also extensively discussed hiatal hernia, reflux and management.  We discussed the need to avoid all and any aspirin and NSAIDs.  We discussed the need for close follow-up with PCP which the patient agrees he will be  doing.          Final Clinical Impression(s) / ED Diagnoses Final diagnoses:  Hiatal hernia  Acute cystitis with hematuria    Rx / DC Orders ED Discharge Orders          Ordered    omeprazole (PRILOSEC) 20 MG capsule  Daily        12/14/22 1534    sucralfate (CARAFATE) 1 GM/10ML suspension  3 times daily with meals & bedtime        12/14/22 1534    cephALEXin (KEFLEX) 500 MG capsule  2 times daily        12/14/22 1535              Charlesetta Shanks, MD 12/14/22 1546

## 2022-12-14 NOTE — ED Triage Notes (Signed)
States that early this morning he started having chest pain, and blood in his urine

## 2022-12-14 NOTE — ED Notes (Addendum)
EDP finished at [facility]. Pt stood up and urinated on floor when EDP walked out. Crawled back in bed. Call bell in reach not used. Urinal at [facility] not used. Pt apologetic, said he couldn't reach either, [facility]S called.

## 2022-12-14 NOTE — ED Notes (Signed)
CT here at [facility] for pt. IVF infusing. VSS. BP high.

## 2022-12-14 NOTE — ED Notes (Signed)
Pt alert, NAD, calm, interactive, resps e/u speaking clearly, c/o pain only, denies other sx.

## 2022-12-14 NOTE — Discharge Instructions (Addendum)
1.  Start taking omeprazole every morning.  Also take Carafate as prescribed 3 times a day for the next week.  These medications are to help with your hiatal hernia and reflux symptoms (review instructions about diet and management of gastroesophageal reflux). 2.  You have bladder wall thickening with bleeding.  You must follow-up with the urologist for recheck again.  Call [facility] Monday to schedule your recheck.  Been placed on an antibiotic called Keflex.  Take this antibiotic as prescribed for suspected urinary tract infection. 3.  Follow-up with your family doctor for recheck regarding your abdominal pain and chest pain.  This time I suspect this is due to your hiatal hernia and reflux.  You may need to be scheduled for an upper endoscopy which is a lighted scope to look at the lining of your esophagus and your stomach. 4.  Return to the emergency department immediately if you have new worsening or concerning symptoms.

## 2022-12-16 LAB — URINE CULTURE: Culture: >=100000 — AB

## 2022-12-17 ENCOUNTER — Telehealth (HOSPITAL_BASED_OUTPATIENT_CLINIC_OR_DEPARTMENT_OTHER): Payer: Self-pay | Admitting: Emergency Medicine

## 2022-12-17 NOTE — Telephone Encounter (Signed)
Post ED Visit - Positive Culture Follow-up  Culture report reviewed by antimicrobial stewardship pharmacist: [place] Team '[]'$  Elenor Quinones, Pharm.D. '[]'$  Heide Guile, Pharm.D., BCPS AQ-ID '[]'$  Parks Neptune, Pharm.D., BCPS '[]'$  Alycia Rossetti, Pharm.D., BCPS '[]'$  [place], Pharm.D., BCPS, AAHIVP '[]'$  Legrand Como, Pharm.D., BCPS, AAHIVP '[]'$  Salome Arnt, PharmD, BCPS '[]'$  Johnnette Gourd, PharmD, BCPS '[]'$  Hughes Better, PharmD, BCPS '[]'$  Leeroy Cha, PharmD '[]'$  Laqueta Linden, PharmD, BCPS '[]'$  Albertina Parr, PharmD  [place] Team '[x]'$  Leodis Sias, PharmD '[]'$  Lindell Spar, PharmD '[]'$  Royetta Asal, PharmD '[]'$  Graylin Shiver, Rph '[]'$  Rema Fendt) Glennon Mac, PharmD '[]'$  Arlyn Dunning, PharmD '[]'$  Netta Cedars, PharmD '[]'$  Dia Sitter, PharmD '[]'$  Leone Haven, PharmD '[]'$  Gretta Arab, PharmD '[]'$  Theodis Shove, PharmD '[]'$  Peggyann Juba, PharmD '[]'$  Reuel Boom, PharmD   Positive urine culture Treated with cephalexin, organism sensitive to the same and no further patient follow-up is required at this time.  Hazle Nordmann 12/17/2022, 12:36 PM

## 2022-12-23 ENCOUNTER — Telehealth: Payer: Self-pay | Admitting: *Deleted

## 2022-12-23 NOTE — Telephone Encounter (Signed)
        Patient  visited [facility] on 12/14/2022  for hernia    Telephone encounter attempt :  1st  A HIPAA compliant voice message was left requesting a return call.  Instructed patient to call back at [phone].  [place] [phone] [address] , [place] [address] Email : [email or web address]

## 2022-12-25 ENCOUNTER — Telehealth: Payer: Self-pay | Admitting: *Deleted

## 2022-12-25 NOTE — Telephone Encounter (Signed)
     Patient  visit on 12/14/2022  at [facility]  was for Pain  Have you been able to follow up with your primary care physician? Patient has gotten all medicine and has an appt scheduled with PCP  The patient was able to obtain any needed medicine or equipment.  Are there diet recommendations that you are having difficulty following? NA  Patient expresses understanding of discharge instructions and education provided has no other needs at this time. Yes   [place] [phone] [address] , [place] [address] Email : [email or web address]

## 2023-03-01 ENCOUNTER — Encounter (HOSPITAL_COMMUNITY): Payer: Self-pay

## 2023-03-01 ENCOUNTER — Emergency Department (HOSPITAL_COMMUNITY): Payer: Medicare HMO

## 2023-03-01 ENCOUNTER — Other Ambulatory Visit: Payer: Self-pay

## 2023-03-01 ENCOUNTER — Inpatient Hospital Stay (HOSPITAL_COMMUNITY): Payer: Medicare HMO

## 2023-03-01 ENCOUNTER — Inpatient Hospital Stay (HOSPITAL_COMMUNITY)
Admission: EM | Admit: 2023-03-01 | Discharge: 2023-03-05 | DRG: 291 | Disposition: A | Discharge status: Home / Self Care | Payer: Medicare HMO | Attending: Internal Medicine | Admitting: Internal Medicine

## 2023-03-01 DIAGNOSIS — E785 Hyperlipidemia, unspecified: Secondary | ICD-10-CM

## 2023-03-01 DIAGNOSIS — B370 Candidal stomatitis: Secondary | ICD-10-CM | POA: Diagnosis not present

## 2023-03-01 DIAGNOSIS — Z716 Tobacco abuse counseling: Secondary | ICD-10-CM

## 2023-03-01 DIAGNOSIS — I351 Nonrheumatic aortic (valve) insufficiency: Secondary | ICD-10-CM | POA: Diagnosis not present

## 2023-03-01 DIAGNOSIS — E119 Type 2 diabetes mellitus without complications: Secondary | ICD-10-CM | POA: Diagnosis present

## 2023-03-01 DIAGNOSIS — R338 Other retention of urine: Secondary | ICD-10-CM | POA: Diagnosis not present

## 2023-03-01 DIAGNOSIS — I160 Hypertensive urgency: Secondary | ICD-10-CM

## 2023-03-01 DIAGNOSIS — G8929 Other chronic pain: Secondary | ICD-10-CM | POA: Diagnosis present

## 2023-03-01 DIAGNOSIS — B9620 Unspecified Escherichia coli [E. coli] as the cause of diseases classified elsewhere: Secondary | ICD-10-CM | POA: Diagnosis not present

## 2023-03-01 DIAGNOSIS — Z87440 Personal history of urinary (tract) infections: Secondary | ICD-10-CM

## 2023-03-01 DIAGNOSIS — Z833 Family history of diabetes mellitus: Secondary | ICD-10-CM

## 2023-03-01 DIAGNOSIS — E782 Mixed hyperlipidemia: Secondary | ICD-10-CM | POA: Diagnosis present

## 2023-03-01 DIAGNOSIS — I110 Hypertensive heart disease with heart failure: Principal | ICD-10-CM | POA: Diagnosis present

## 2023-03-01 DIAGNOSIS — Z1623 Resistance to quinolones and fluoroquinolones: Secondary | ICD-10-CM | POA: Diagnosis present

## 2023-03-01 DIAGNOSIS — F17200 Nicotine dependence, unspecified, uncomplicated: Secondary | ICD-10-CM | POA: Diagnosis not present

## 2023-03-01 DIAGNOSIS — Z8673 Personal history of transient ischemic attack (TIA), and cerebral infarction without residual deficits: Secondary | ICD-10-CM | POA: Diagnosis not present

## 2023-03-01 DIAGNOSIS — I2489 Other forms of acute ischemic heart disease: Secondary | ICD-10-CM | POA: Diagnosis not present

## 2023-03-01 DIAGNOSIS — J811 Chronic pulmonary edema: Secondary | ICD-10-CM | POA: Diagnosis not present

## 2023-03-01 DIAGNOSIS — R109 Unspecified abdominal pain: Secondary | ICD-10-CM | POA: Diagnosis not present

## 2023-03-01 DIAGNOSIS — I639 Cerebral infarction, unspecified: Secondary | ICD-10-CM | POA: Diagnosis present

## 2023-03-01 DIAGNOSIS — R0902 Hypoxemia: Secondary | ICD-10-CM | POA: Diagnosis present

## 2023-03-01 DIAGNOSIS — N390 Urinary tract infection, site not specified: Secondary | ICD-10-CM | POA: Diagnosis not present

## 2023-03-01 DIAGNOSIS — B3789 Other sites of candidiasis: Secondary | ICD-10-CM | POA: Diagnosis not present

## 2023-03-01 DIAGNOSIS — Z7982 Long term (current) use of aspirin: Secondary | ICD-10-CM

## 2023-03-01 DIAGNOSIS — R0981 Nasal congestion: Secondary | ICD-10-CM | POA: Diagnosis present

## 2023-03-01 DIAGNOSIS — N400 Enlarged prostate without lower urinary tract symptoms: Secondary | ICD-10-CM | POA: Diagnosis present

## 2023-03-01 DIAGNOSIS — Z7984 Long term (current) use of oral hypoglycemic drugs: Secondary | ICD-10-CM

## 2023-03-01 DIAGNOSIS — F17210 Nicotine dependence, cigarettes, uncomplicated: Secondary | ICD-10-CM | POA: Diagnosis present

## 2023-03-01 DIAGNOSIS — I5032 Chronic diastolic (congestive) heart failure: Secondary | ICD-10-CM | POA: Diagnosis present

## 2023-03-01 DIAGNOSIS — Z8249 Family history of ischemic heart disease and other diseases of the circulatory system: Secondary | ICD-10-CM

## 2023-03-01 DIAGNOSIS — Z79899 Other long term (current) drug therapy: Secondary | ICD-10-CM

## 2023-03-01 DIAGNOSIS — I5031 Acute diastolic (congestive) heart failure: Secondary | ICD-10-CM | POA: Diagnosis not present

## 2023-03-01 DIAGNOSIS — I161 Hypertensive emergency: Secondary | ICD-10-CM | POA: Diagnosis present

## 2023-03-01 DIAGNOSIS — Z23 Encounter for immunization: Secondary | ICD-10-CM | POA: Diagnosis not present

## 2023-03-01 DIAGNOSIS — T502X5A Adverse effect of carbonic-anhydrase inhibitors, benzothiadiazides and other diuretics, initial encounter: Secondary | ICD-10-CM | POA: Diagnosis not present

## 2023-03-01 DIAGNOSIS — I509 Heart failure, unspecified: Secondary | ICD-10-CM | POA: Diagnosis not present

## 2023-03-01 DIAGNOSIS — M549 Dorsalgia, unspecified: Secondary | ICD-10-CM | POA: Diagnosis present

## 2023-03-01 DIAGNOSIS — E876 Hypokalemia: Secondary | ICD-10-CM | POA: Diagnosis not present

## 2023-03-01 DIAGNOSIS — N3000 Acute cystitis without hematuria: Secondary | ICD-10-CM | POA: Diagnosis not present

## 2023-03-01 DIAGNOSIS — Z7151 Drug abuse counseling and surveillance of drug abuser: Secondary | ICD-10-CM

## 2023-03-01 DIAGNOSIS — I5033 Acute on chronic diastolic (congestive) heart failure: Secondary | ICD-10-CM | POA: Diagnosis present

## 2023-03-01 DIAGNOSIS — E1169 Type 2 diabetes mellitus with other specified complication: Secondary | ICD-10-CM | POA: Diagnosis present

## 2023-03-01 DIAGNOSIS — R059 Cough, unspecified: Secondary | ICD-10-CM | POA: Diagnosis not present

## 2023-03-01 DIAGNOSIS — R7989 Other specified abnormal findings of blood chemistry: Secondary | ICD-10-CM | POA: Diagnosis present

## 2023-03-01 DIAGNOSIS — I421 Obstructive hypertrophic cardiomyopathy: Secondary | ICD-10-CM | POA: Diagnosis present

## 2023-03-01 DIAGNOSIS — N401 Enlarged prostate with lower urinary tract symptoms: Secondary | ICD-10-CM | POA: Diagnosis not present

## 2023-03-01 DIAGNOSIS — Z1152 Encounter for screening for COVID-19: Secondary | ICD-10-CM | POA: Diagnosis not present

## 2023-03-01 DIAGNOSIS — I10 Essential (primary) hypertension: Secondary | ICD-10-CM | POA: Diagnosis not present

## 2023-03-01 LAB — COMPREHENSIVE METABOLIC PANEL
ALT: 13 U/L (ref 0–44)
AST: 20 U/L (ref 15–41)
Albumin: 3.7 g/dL (ref 3.5–5.0)
Alkaline Phosphatase: 60 U/L (ref 38–126)
Anion gap: 7 (ref 5–15)
BUN: 19 mg/dL (ref 8–23)
CO2: 23 mmol/L (ref 22–32)
Calcium: 8.9 mg/dL (ref 8.9–10.3)
Chloride: 110 mmol/L (ref 98–111)
Creatinine, Ser: 0.8 mg/dL (ref 0.61–1.24)
GFR, Estimated: >60 mL/min (ref >60)
Glucose, Bld: 108 mg/dL — ABNORMAL HIGH (ref 70–99)
Potassium: 3.2 mmol/L — ABNORMAL LOW (ref 3.5–5.1)
Sodium: 140 mmol/L (ref 135–145)
Total Bilirubin: 0.9 mg/dL (ref 0.3–1.2)
Total Protein: 7.9 g/dL (ref 6.5–8.1)

## 2023-03-01 LAB — CBC WITH DIFFERENTIAL/PLATELET
Abs Immature Granulocytes: 0.03 10*3/uL (ref 0.00–0.07)
Basophils Absolute: 0.1 10*3/uL (ref 0.0–0.1)
Basophils Relative: 1 %
Eosinophils Absolute: 0.3 10*3/uL (ref 0.0–0.5)
Eosinophils Relative: 3 %
HCT: 42 % (ref 39.0–52.0)
Hemoglobin: 12.9 g/dL — ABNORMAL LOW (ref 13.0–17.0)
Immature Granulocytes: 0 %
Lymphocytes Relative: 27 %
Lymphs Abs: 2.6 10*3/uL (ref 0.7–4.0)
MCH: 22.2 pg — ABNORMAL LOW (ref 26.0–34.0)
MCHC: 30.7 g/dL (ref 30.0–36.0)
MCV: 72.2 fL — ABNORMAL LOW (ref 80.0–100.0)
Monocytes Absolute: 0.8 10*3/uL (ref 0.1–1.0)
Monocytes Relative: 8 %
Neutro Abs: 5.7 10*3/uL (ref 1.7–7.7)
Neutrophils Relative %: 61 %
Platelets: 263 10*3/uL (ref 150–400)
RBC: 5.82 MIL/uL — ABNORMAL HIGH (ref 4.22–5.81)
RDW: 15.6 % — ABNORMAL HIGH (ref 11.5–15.5)
WBC: 9.5 10*3/uL (ref 4.0–10.5)
nRBC: 0 % (ref 0.0–0.2)

## 2023-03-01 LAB — RESP PANEL BY RT-PCR (RSV, FLU A&B, COVID)  RVPGX2
Influenza A by PCR: NEGATIVE
Influenza B by PCR: NEGATIVE
Resp Syncytial Virus by PCR: NEGATIVE
SARS Coronavirus 2 by RT PCR: NEGATIVE

## 2023-03-01 LAB — URINALYSIS, ROUTINE W REFLEX MICROSCOPIC
Bilirubin Urine: NEGATIVE
Glucose, UA: NEGATIVE mg/dL
Ketones, ur: NEGATIVE mg/dL
Leukocytes,Ua: NEGATIVE
Nitrite: NEGATIVE
Protein, ur: 100 mg/dL — AB
Specific Gravity, Urine: 1.009 (ref 1.005–1.030)
pH: 8 (ref 5.0–8.0)

## 2023-03-01 LAB — GLUCOSE, CAPILLARY
Glucose-Capillary: 142 mg/dL — ABNORMAL HIGH (ref 70–99)
Glucose-Capillary: 170 mg/dL — ABNORMAL HIGH (ref 70–99)
Glucose-Capillary: 162 mg/dL — ABNORMAL HIGH (ref 70–99)
Glucose-Capillary: 168 mg/dL — ABNORMAL HIGH (ref 70–99)

## 2023-03-01 LAB — RAPID URINE DRUG SCREEN, HOSP PERFORMED
Amphetamines: NOT DETECTED
Barbiturates: NOT DETECTED
Benzodiazepines: NOT DETECTED
Cocaine: NOT DETECTED
Opiates: NOT DETECTED
Tetrahydrocannabinol: NOT DETECTED

## 2023-03-01 LAB — TROPONIN I (HIGH SENSITIVITY)
Troponin I (High Sensitivity): 39 ng/L — ABNORMAL HIGH (ref <18)
Troponin I (High Sensitivity): 326 ng/L (ref <18)
Troponin I (High Sensitivity): 292 ng/L (ref <18)

## 2023-03-01 LAB — BRAIN NATRIURETIC PEPTIDE: B Natriuretic Peptide: 894.6 pg/mL — ABNORMAL HIGH (ref 0.0–100.0)

## 2023-03-01 LAB — MAGNESIUM: Magnesium: 1.5 mg/dL — ABNORMAL LOW (ref 1.7–2.4)

## 2023-03-01 MED ORDER — SODIUM CHLORIDE 0.9 % IV SOLN
1.0000 g | INTRAVENOUS | Status: AC
  Administered 2023-03-01: 1 g via INTRAVENOUS
  Filled 2023-03-01: qty 10

## 2023-03-01 MED ORDER — ORAL CARE MOUTH RINSE
15.0000 mL | OROMUCOSAL | Status: DC | PRN
  Administered 2023-03-02: 15 mL via OROMUCOSAL

## 2023-03-01 MED ORDER — ONDANSETRON HCL 4 MG/2ML IJ SOLN
4.0000 mg | Freq: Four times a day (QID) | INTRAMUSCULAR | Status: DC | PRN
  Administered 2023-03-01: 4 mg via INTRAVENOUS
  Filled 2023-03-01: qty 2

## 2023-03-01 MED ORDER — FUROSEMIDE 10 MG/ML IJ SOLN
40.0000 mg | Freq: Two times a day (BID) | INTRAMUSCULAR | Status: DC
  Administered 2023-03-01 – 2023-03-02 (×3): 40 mg via INTRAVENOUS
  Filled 2023-03-01 (×3): qty 4

## 2023-03-01 MED ORDER — ASPIRIN 81 MG PO TBEC
81.0000 mg | DELAYED_RELEASE_TABLET | Freq: Every morning | ORAL | Status: DC
  Administered 2023-03-02 – 2023-03-05 (×4): 81 mg via ORAL
  Filled 2023-03-01 (×4): qty 1

## 2023-03-01 MED ORDER — DICLOFENAC SODIUM 1 % EX GEL: 2.0000 g | Freq: Four times a day (QID) | CUTANEOUS | Status: DC | PRN

## 2023-03-01 MED ORDER — SODIUM CHLORIDE 0.9 % IV SOLN
2.0000 g | INTRAVENOUS | Status: DC
  Administered 2023-03-02: 2 g via INTRAVENOUS
  Filled 2023-03-01: qty 20

## 2023-03-01 MED ORDER — BISACODYL 10 MG RE SUPP
10.0000 mg | Freq: Once | RECTAL | Status: AC
  Administered 2023-03-01: 10 mg via RECTAL
  Filled 2023-03-01: qty 1

## 2023-03-01 MED ORDER — ENOXAPARIN SODIUM 40 MG/0.4ML IJ SOSY
40.0000 mg | PREFILLED_SYRINGE | Freq: Every day | INTRAMUSCULAR | Status: DC
  Administered 2023-03-01: 40 mg via SUBCUTANEOUS
  Filled 2023-03-01: qty 0.4

## 2023-03-01 MED ORDER — HYDRALAZINE HCL 20 MG/ML IJ SOLN
10.0000 mg | INTRAMUSCULAR | Status: AC | PRN
  Administered 2023-03-01 – 2023-03-02 (×3): 10 mg via INTRAVENOUS
  Filled 2023-03-01 (×3): qty 1

## 2023-03-01 MED ORDER — PANTOPRAZOLE SODIUM 40 MG PO TBEC: 40.0000 mg | DELAYED_RELEASE_TABLET | Freq: Every day | ORAL | Status: DC

## 2023-03-01 MED ORDER — HYDRALAZINE HCL 50 MG PO TABS
100.0000 mg | ORAL_TABLET | Freq: Three times a day (TID) | ORAL | Status: DC
  Administered 2023-03-02 – 2023-03-05 (×10): 100 mg via ORAL
  Filled 2023-03-01 (×11): qty 2

## 2023-03-01 MED ORDER — FUROSEMIDE 10 MG/ML IJ SOLN
40.0000 mg | Freq: Once | INTRAMUSCULAR | Status: AC
  Administered 2023-03-01: 40 mg via INTRAVENOUS
  Filled 2023-03-01: qty 4

## 2023-03-01 MED ORDER — DOXAZOSIN MESYLATE 4 MG PO TABS: 4.0000 mg | ORAL_TABLET | Freq: Every day | ORAL | Status: DC

## 2023-03-01 MED ORDER — SODIUM CHLORIDE 0.9% FLUSH: 3.0000 mL | INTRAVENOUS | Status: DC | PRN

## 2023-03-01 MED ORDER — HYDRALAZINE HCL 20 MG/ML IJ SOLN
20.0000 mg | Freq: Once | INTRAMUSCULAR | Status: AC
  Administered 2023-03-01: 20 mg via INTRAVENOUS
  Filled 2023-03-01: qty 1

## 2023-03-01 MED ORDER — DILTIAZEM HCL ER COATED BEADS 120 MG PO CP24: 120.0000 mg | ORAL_CAPSULE | Freq: Every day | ORAL | Status: DC

## 2023-03-01 MED ORDER — LORATADINE 10 MG PO TABS
10.0000 mg | ORAL_TABLET | Freq: Every day | ORAL | Status: DC
  Administered 2023-03-02 – 2023-03-05 (×4): 10 mg via ORAL
  Filled 2023-03-01 (×4): qty 1

## 2023-03-01 MED ORDER — CHLORHEXIDINE GLUCONATE CLOTH 2 % EX PADS
6.0000 | MEDICATED_PAD | Freq: Every day | CUTANEOUS | Status: DC
  Administered 2023-03-01 – 2023-03-05 (×5): 6 via TOPICAL

## 2023-03-01 MED ORDER — PANTOPRAZOLE SODIUM 40 MG IV SOLR
40.0000 mg | Freq: Every day | INTRAVENOUS | Status: DC
  Administered 2023-03-01 – 2023-03-02 (×2): 40 mg via INTRAVENOUS
  Filled 2023-03-01 (×3): qty 10

## 2023-03-01 MED ORDER — LOSARTAN POTASSIUM 25 MG PO TABS
100.0000 mg | ORAL_TABLET | Freq: Every day | ORAL | Status: DC
  Administered 2023-03-02: 100 mg via ORAL
  Filled 2023-03-01 (×2): qty 4

## 2023-03-01 MED ORDER — POTASSIUM CHLORIDE 10 MEQ/100ML IV SOLN
10.0000 meq | INTRAVENOUS | Status: AC
  Administered 2023-03-01 (×4): 10 meq via INTRAVENOUS
  Filled 2023-03-01 (×4): qty 100

## 2023-03-01 MED ORDER — GUAIFENESIN ER 600 MG PO TB12
1200.0000 mg | ORAL_TABLET | Freq: Two times a day (BID) | ORAL | Status: DC
  Administered 2023-03-03 – 2023-03-05 (×4): 1200 mg via ORAL
  Filled 2023-03-01 (×7): qty 2

## 2023-03-01 MED ORDER — SODIUM CHLORIDE 0.9 % IV SOLN
250.0000 mL | INTRAVENOUS | Status: DC | PRN
  Administered 2023-03-01: 250 mL via INTRAVENOUS

## 2023-03-01 MED ORDER — SODIUM CHLORIDE 0.9% FLUSH
3.0000 mL | Freq: Two times a day (BID) | INTRAVENOUS | Status: DC
  Administered 2023-03-01 – 2023-03-05 (×8): 3 mL via INTRAVENOUS

## 2023-03-01 MED ORDER — NICOTINE 21 MG/24HR TD PT24
21.0000 mg | MEDICATED_PATCH | Freq: Every day | TRANSDERMAL | Status: DC | PRN
  Administered 2023-03-01 – 2023-03-04 (×3): 21 mg via TRANSDERMAL
  Filled 2023-03-01 (×3): qty 1

## 2023-03-01 MED ORDER — PRAVASTATIN SODIUM 20 MG PO TABS: 20.0000 mg | ORAL_TABLET | Freq: Every day | ORAL | Status: DC

## 2023-03-01 MED ORDER — HYDRALAZINE HCL 20 MG/ML IJ SOLN
10.0000 mg | Freq: Once | INTRAMUSCULAR | Status: AC
  Administered 2023-03-01: 10 mg via INTRAVENOUS
  Filled 2023-03-01: qty 1

## 2023-03-01 MED ORDER — NITROGLYCERIN IN D5W 200-5 MCG/ML-% IV SOLN
0.0000 ug/min | INTRAVENOUS | Status: DC
  Administered 2023-03-01: 5 ug/min via INTRAVENOUS
  Administered 2023-03-01: 140 ug/min via INTRAVENOUS
  Administered 2023-03-02: 95 ug/min via INTRAVENOUS
  Administered 2023-03-02: 145 ug/min via INTRAVENOUS
  Administered 2023-03-02: 65 ug/min via INTRAVENOUS
  Filled 2023-03-01 (×5): qty 250

## 2023-03-01 MED ORDER — LORAZEPAM 2 MG/ML IJ SOLN
0.5000 mg | INTRAMUSCULAR | Status: DC | PRN
  Administered 2023-03-01 – 2023-03-02 (×4): 0.5 mg via INTRAVENOUS
  Filled 2023-03-01 (×4): qty 1

## 2023-03-01 MED ORDER — MAGNESIUM SULFATE 4 GM/100ML IV SOLN
4.0000 g | Freq: Once | INTRAVENOUS | Status: AC
  Administered 2023-03-01: 4 g via INTRAVENOUS
  Filled 2023-03-01: qty 100

## 2023-03-01 MED ORDER — ACETAMINOPHEN 500 MG PO TABS
1000.0000 mg | ORAL_TABLET | Freq: Once | ORAL | Status: AC
  Administered 2023-03-01: 1000 mg via ORAL
  Filled 2023-03-01: qty 2

## 2023-03-01 MED ORDER — INSULIN ASPART 100 UNIT/ML IJ SOLN
0.0000 [IU] | Freq: Three times a day (TID) | INTRAMUSCULAR | Status: DC
  Administered 2023-03-01: 1 [IU] via SUBCUTANEOUS
  Administered 2023-03-01: 2 [IU] via SUBCUTANEOUS
  Administered 2023-03-02: 1 [IU] via SUBCUTANEOUS
  Administered 2023-03-02: 2 [IU] via SUBCUTANEOUS
  Administered 2023-03-02: 1 [IU] via SUBCUTANEOUS
  Administered 2023-03-03: 2 [IU] via SUBCUTANEOUS
  Administered 2023-03-03: 1 [IU] via SUBCUTANEOUS
  Administered 2023-03-04: 3 [IU] via SUBCUTANEOUS
  Administered 2023-03-04: 1 [IU] via SUBCUTANEOUS
  Administered 2023-03-04: 2 [IU] via SUBCUTANEOUS
  Administered 2023-03-05 (×2): 1 [IU] via SUBCUTANEOUS

## 2023-03-01 MED ORDER — POTASSIUM CHLORIDE CRYS ER 20 MEQ PO TBCR
40.0000 meq | EXTENDED_RELEASE_TABLET | ORAL | Status: DC
  Filled 2023-03-01: qty 2

## 2023-03-01 MED ORDER — FLUTICASONE PROPIONATE 50 MCG/ACT NA SUSP
2.0000 | Freq: Every day | NASAL | Status: DC
  Administered 2023-03-02 – 2023-03-05 (×4): 2 via NASAL
  Filled 2023-03-01: qty 16

## 2023-03-01 MED ORDER — ACETAMINOPHEN 325 MG PO TABS: 650.0000 mg | ORAL_TABLET | ORAL | Status: DC | PRN

## 2023-03-01 MED ORDER — TAMSULOSIN HCL 0.4 MG PO CAPS
0.8000 mg | ORAL_CAPSULE | Freq: Every day | ORAL | Status: DC
  Administered 2023-03-02 – 2023-03-04 (×3): 0.8 mg via ORAL
  Filled 2023-03-01 (×3): qty 2

## 2023-03-01 MED ORDER — SODIUM CHLORIDE 0.9 % IV SOLN
1.0000 g | Freq: Once | INTRAVENOUS | Status: AC
  Administered 2023-03-01: 1 g via INTRAVENOUS
  Filled 2023-03-01: qty 10

## 2023-03-01 MED ORDER — MORPHINE SULFATE (PF) 2 MG/ML IV SOLN
2.0000 mg | INTRAVENOUS | Status: DC | PRN
  Administered 2023-03-01 – 2023-03-05 (×12): 2 mg via INTRAVENOUS
  Filled 2023-03-01 (×12): qty 1

## 2023-03-01 MED ORDER — ENOXAPARIN SODIUM 40 MG/0.4ML IJ SOSY
40.0000 mg | PREFILLED_SYRINGE | INTRAMUSCULAR | Status: DC
  Administered 2023-03-02 – 2023-03-04 (×3): 40 mg via SUBCUTANEOUS
  Filled 2023-03-01 (×3): qty 0.4

## 2023-03-01 NOTE — H&P (Signed)
History and Physical    [name] [id number] DOB: January 01, 1951 DOA: 03/01/2023  PCP: Nolene Ebbs, MD  Patient coming from: Home  I have personally briefly reviewed patient's old medical records in [place]  Chief Complaint: Shortness of breath, respiratory symptoms  HPI: [name] is a 72 y.o. male with medical history significant of HOCM, prior history of alcohol dependence, history of brain tumor, history of chronic diastolic CHF, chronic back pain, chronic headaches, history of CVA, diabetes mellitus type 2, hypertension, prior history of narcotic abuse, ongoing tobacco use presenting to the ED with a 2-day history of rhinorrhea, nasal congestion, productive cough, shortness of breath.  Patient does endorse some right-sided chest pain currently no chest pain in the ED.  Patient with some diffuse abdominal pain, some dysuria.  Patient denies any fever, no chills, no melena, hematemesis, no hematochezia, no diarrhea, no constipation.  No syncope, no lightheadedness.  ED Course: Patient seen in the ED, SARS coronavirus 2 PCR done negative, influenza A and B PCR done negative, RSV PCR negative.  CBC done with a hemoglobin of 12.9, platelet count of 263 otherwise within normal limits.  Glucose noted at 108.  BNP noted at 894.6.  Comprehensive metabolic profile done with a potassium of 3.2, glucose of 108 otherwise within normal limits.  Magnesium level of 1.5.  Initial troponin elevated at 39.  Urinalysis done nitrite negative, leukocytes negative, 21-50 WBCs, many bacteria.  Chest x-ray done with progressive enlargement of the cardiopericardial silhouette concerning for increasing cardiomegaly.  Pulmonary vascular congestion with diffuse fine interstitial prominence.  Differential considerations include early interstitial edema versus atypical/viral respiratory infection.  Patient noted on presentation to be in hypertensive emergency with systolic blood pressure as high as 248/58.   Patient on a nitroglycerin drip in the ED.  Review of Systems: As per HPI otherwise all other systems reviewed and are negative.  Past Medical History:  Diagnosis Date   Alcohol dependence ([place]) 12/10/2013   Angioedema of lips 12/24/2013   Brain tumor ([place])    CHF (congestive heart failure) (HCC)    diastolic   Chronic back pain    Chronic headache    CVA (cerebral vascular accident) ([place])    Diabetes mellitus    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[place]) 02/18/2020   HTN (hypertension), malignant 09/06/2012   Hypertension    Narcotic abuse ([place])    Normal cardiac stress test 2009   TIA (transient ischemic attack) 09/06/2012   Tobacco use disorder 12/10/2013   UTI (urinary tract infection)    Vertigo 09/06/2012    Past Surgical History:  Procedure Laterality Date   NO PAST SURGERIES      Social History  reports that he has been smoking cigarettes. He has been smoking an average of .5 packs per day. He uses smokeless tobacco. He reports current alcohol use. He reports that he does not currently use drugs after having used the following drugs: Marijuana, Cocaine, and IV.  No Known Allergies  Family History  Problem Relation Age of Onset   CAD Father        "heart attack in his 8's"   Hypertension Father    Diabetes Brother    Diabetes Sister    Hypertension Mother    Mother deceased in her 7s unknown per patient.  Father deceased in his 42s from an acute MI.  Prior to Admission medications   Medication Sig Start Date End Date Taking? Authorizing Provider  albuterol (PROVENTIL HFA;VENTOLIN HFA)  108 (90 Base) MCG/ACT inhaler Inhale 1-2 puffs into the lungs every 6 (six) hours as needed for wheezing. 09/04/16  Yes Tanna Furry, MD  amLODipine (NORVASC) 10 MG tablet Take 1 tablet (10 mg total) by mouth daily. 03/25/22   Regalado, Jerald Kief A, MD  aspirin EC 81 MG tablet Take 81 mg by mouth every morning.    [provider]  cephALEXin (KEFLEX) 500 MG capsule Take  1 capsule (500 mg total) by mouth 4 (four) times daily. Patient not taking: Reported on 03/01/2023 08/19/22   Pattricia Boss, MD  diclofenac Sodium (VOLTAREN) 1 % GEL Apply 1 application. topically 4 (four) times daily as needed (pain). 12/27/21   [provider]  diltiazem (CARDIZEM CD) 120 MG 24 hr capsule Take 1 capsule (120 mg total) by mouth daily. 03/25/22 03/25/23  Regalado, Belkys A, MD  doxazosin (CARDURA) 4 MG tablet Take 1 tablet (4 mg total) by mouth daily. 03/25/22   Regalado, Belkys A, MD  hydrALAZINE (APRESOLINE) 100 MG tablet Take 1 tablet (100 mg total) by mouth every 8 (eight) hours. 03/25/22   Regalado, Belkys A, MD  losartan (COZAAR) 100 MG tablet Take 1 tablet (100 mg total) by mouth daily. 03/25/22   Regalado, Belkys A, MD  losartan-hydrochlorothiazide (HYZAAR) 100-12.5 MG tablet Take 1 tablet by mouth daily.    [provider]  metFORMIN (GLUCOPHAGE) 500 MG tablet Take 1 tablet by mouth twice a day Patient taking differently: Take 500 mg by mouth 2 (two) times daily. 06/04/21     nicotine (NICODERM CQ - DOSED IN MG/24 HOURS) 21 mg/24hr patch Place 1 patch (21 mg total) onto the skin daily. Patient taking differently: Place 21 mg onto the skin daily as needed (smoking cessation). 02/21/20   Eugenie Filler, MD  omeprazole (PRILOSEC) 20 MG capsule Take 1 capsule (20 mg total) by mouth daily. 12/14/22   Charlesetta Shanks, MD  ondansetron (ZOFRAN-ODT) 8 MG disintegrating tablet Take 1 tablet (8 mg total) by mouth every 8 (eight) hours as needed for nausea or vomiting. Patient not taking: Reported on 03/01/2023 08/19/22   Pattricia Boss, MD  pantoprazole (PROTONIX) 40 MG tablet Take 1 tablet (40 mg total) by mouth daily. Patient not taking: Reported on 03/23/2022 02/20/20   Eugenie Filler, MD  pravastatin (PRAVACHOL) 20 MG tablet Take 20 mg by mouth in the morning. 02/18/22   [provider]  sucralfate (CARAFATE) 1 GM/10ML suspension Take 10 mLs (1 g total) by mouth  4 (four) times daily -  with meals and at bedtime. Patient not taking: Reported on 03/01/2023 12/14/22   Charlesetta Shanks, MD  tamsulosin (FLOMAX) 0.4 MG CAPS capsule TAKE TWO CAPSULES BY MOUTH AT BEDTIME 03/25/22   Regalado, Belkys A, MD  traMADol (ULTRAM) 50 MG tablet Take 1 tablet by mouth twice a day as needed for severe pains Patient not taking: Reported on 03/01/2023 06/04/21       Physical Exam: Vitals:   03/01/23 1620 03/01/23 1625 03/01/23 1630 03/01/23 1635  BP: (!) 145/51 (!) 186/65 (!) 209/50 (!) 174/64  Pulse: (!) 103 (!) 106 99 91  Resp: (!) 27 (!) 23 (!) 25 (!) 24  Temp:      TempSrc:      SpO2: 95% 95% 97% 96%    Constitutional: NAD, calm, comfortable Vitals:   03/01/23 1620 03/01/23 1625 03/01/23 1630 03/01/23 1635  BP: (!) 145/51 (!) 186/65 (!) 209/50 (!) 174/64  Pulse: (!) 103 (!) 106 99 91  Resp: Marland Kitchen)  27 (!) 23 (!) 25 (!) 24  Temp:      TempSrc:      SpO2: 95% 95% 97% 96%   Eyes: PERRL, lids and conjunctivae normal ENMT: Mucous membranes are moist. Posterior pharynx clear of any exudate or lesions.Normal dentition.  Neck: normal, supple, no masses, no thyromegaly Respiratory: Some decreased breath sounds in the bases.  Some scattered crackles.  No wheezing.  Fair air movement.  Speaking in full sentences.  No use of accessory muscles of respiration.   Cardiovascular: Tachycardia., no murmurs / rubs / gallops. No extremity edema. 2+ pedal pulses. No carotid bruits.  Abdomen: no tenderness, no masses palpated. No hepatosplenomegaly. Bowel sounds positive.  Musculoskeletal: no clubbing / cyanosis. No joint deformity upper and lower extremities. Good ROM, no contractures. Normal muscle tone.  Skin: no rashes, lesions, ulcers. No induration Neurologic: CN 2-12 grossly intact. Sensation intact, DTR normal. Strength 5/5 in all 4.  Psychiatric: Normal judgment and insight. Alert and oriented x 3. Normal mood.    Labs on Admission: I have personally reviewed following labs  and imaging studies  CBC: Recent Labs  Lab 03/01/23 0657  WBC 9.5  NEUTROABS 5.7  HGB 12.9*  HCT 42.0  MCV 72.2*  PLT 99991111    Basic Metabolic Panel: Recent Labs  Lab 03/01/23 0657 03/01/23 1233  NA 140  --   K 3.2*  --   CL 110  --   CO2 23  --   GLUCOSE 108*  --   BUN 19  --   CREATININE 0.80  --   CALCIUM 8.9  --   MG  --  1.5*    GFR: CrCl cannot be calculated (Unknown ideal weight.).  Liver Function Tests: Recent Labs  Lab 03/01/23 0657  AST 20  ALT 13  ALKPHOS 60  BILITOT 0.9  PROT 7.9  ALBUMIN 3.7    Urine analysis:    Component Value Date/Time   COLORURINE AMBER (A) 03/01/2023 1032   APPEARANCEUR CLOUDY (A) 03/01/2023 1032   LABSPEC 1.009 03/01/2023 1032   PHURINE 8.0 03/01/2023 1032   GLUCOSEU NEGATIVE 03/01/2023 1032   HGBUR SMALL (A) 03/01/2023 1032   BILIRUBINUR NEGATIVE 03/01/2023 1032   KETONESUR NEGATIVE 03/01/2023 1032   PROTEINUR 100 (A) 03/01/2023 1032   UROBILINOGEN 2.0 (H) 08/06/2015 1209   NITRITE NEGATIVE 03/01/2023 1032   LEUKOCYTESUR NEGATIVE 03/01/2023 1032    Radiological Exams on Admission: DG Abd 2 Views  Result Date: 03/01/2023 CLINICAL DATA:  Vomiting and abdominal pain EXAM: ABDOMEN - 2 VIEW COMPARISON:  X-ray 11/03/2014.  CT 08/19/2022 FINDINGS: Gas is seen in nondilated loops of small and large bowel. Scattered stool. Few air-filled mildly distended loops of small bowel right upper quadrant. Gas and stool in the rectum. No obstruction. No obvious free air beneath the diaphragm on the upright view. Degenerative changes of the spine. There are some presumed vascular calcifications along the pelvis. Overlapping cardiac leads. IMPRESSION: Nonspecific bowel gas pattern. Electronically Signed   By: Jill Side M.D.   On: 03/01/2023 14:47   DG Chest 2 View  Result Date: 03/01/2023 CLINICAL DATA:  Cough, flu like symptoms EXAM: CHEST - 2 VIEW COMPARISON:  Prior chest x-ray 12/14/2022 FINDINGS: Significant enlargement of the  cardiopericardial silhouette appears greater than previously seen. Additionally, fine interstitial prominence is present throughout the lungs with mild pulmonary vascular congestion. No overt pulmonary edema. No pleural effusion or pneumothorax. No focal airspace infiltrate. IMPRESSION: 1. Progressive enlargement of the cardiopericardial silhouette  concerning for increasing cardiomegaly. 2. Pulmonary vascular congestion with diffuse fine interstitial prominence. Differential considerations include early interstitial edema versus atypical/viral respiratory infection. Electronically Signed   By: Jacqulynn Cadet M.D.   On: 03/01/2023 06:27    EKG: Independently reviewed.  Sinus tachycardia.  Left anterior fascicular block.  Prolonged QT.  Assessment/Plan Principal Problem:   Acute exacerbation of CHF (congestive heart failure) (HCC) Active Problems:   Hypertensive emergency   Controlled type 2 diabetes mellitus without complication, without long-term current use of insulin (HCC)   Tobacco use disorder   Hypokalemia   History of TIA (transient ischemic attack)   Chronic diastolic heart failure (HCC)   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HCC)   UTI (urinary tract infection)   CVA (cerebral vascular accident) ([place])   Hypomagnesemia   #1 acute on chronic diastolic CHF exacerbation/Hx HOCM -Likely secondary to hypertensive emergency -Patient presented with a 2-day history of upper respiratory symptoms including rhinorrhea, nasal congestion, cough, shortness of breath. -Respiratory viral panel with SARS coronavirus 2 negative, influenza AMB negative, RSV negative. -Patient noted to have elevated BNP of 894.6. -Chest x-ray with increasing cardiopericardial silhouette concern for volume overload. -Patient noted to be in hypertensive emergency on presentation knows he only takes 1 blood pressure medication however per med rec looks like he is supposed to be on more than 1 antihypertensive  medication. -Check a respiratory viral panel. -Cycle cardiac enzymes. -Check a 2D echo. -Patient currently on a nitroglycerin drip which we will continue. -Placed on Claritin, Flonase, Mucinex, PPI. -Place on Lasix 40 mg IV every 12 hours. -Resume home regimen ARB. -Hold beta-blocker at this time due to acute decompensation. -Strict I's and O's, daily weights. -Supportive care.  2.  Hypertensive emergency -??  Etiology. -Patient not sure of which antihypertensive medications he is on however states he takes an antihypertensive medication 3 times a day. -Med rec looks like he is supposed to be on more than 1 antihypertensive medication. -Patient presented in CHF exacerbation. -Check a 2D echo, cycle cardiac enzymes, check a UDS. -Continue nitroglycerin drip with goal systolic blood pressure 123456 to 180s. -Resume home regimen ARB and oral hydralazine and Cardizem..  3.??  UTI -Check urine cultures. -Placed empirically on IV Rocephin.  4.  Hypokalemia/hypomagnesemia -Replete.  5.  History of CVA -Continue aspirin for secondary stroke prophylaxis. -Continue statin. -Check a fasting lipid panel.  6.  Diabetes mellitus type 2 -Check a hemoglobin A1c. -Hold oral hypoglycemic agents. -SSI.  7.  Tobacco abuse -Tobacco cessation. -Nicotine patch.       DVT prophylaxis: Lovenox Code Status:   Full Family Communication: Updated patient.  No family at bedside Disposition Plan:   Patient is from:  Home  Anticipated DC to:  TBD  Anticipated DC date:  TBD  Anticipated DC barriers: Clinical improvement  Consults called:  None Admission status:  Admitted to ICU  Severity of Illness: The appropriate patient status for this patient is INPATIENT. Inpatient status is judged to be reasonable and necessary in order to provide the required intensity of service to ensure the patient's safety. The patient's presenting symptoms, physical exam findings, and initial radiographic and  laboratory data in the context of their chronic comorbidities is felt to place them at high risk for further clinical deterioration. Furthermore, it is not anticipated that the patient will be medically stable for discharge from the hospital within 2 midnights of admission.   * I certify that at the point of admission it is my clinical  judgment that the patient will require inpatient hospital care spanning beyond 2 midnights from the point of admission due to high intensity of service, high risk for further deterioration and high frequency of surveillance required.*    Irine Seal MD [facility]  How to contact the [facility] Attending or Consulting provider [place] or covering provider during after hours [place], for this patient?   Check the care team in [facility] and look for a) attending/consulting [facility] provider listed and b) the [facility] team listed Log into [email or web address] and use [place]'s universal password to access. If you do not have the password, please contact the hospital operator. Locate the [facility] provider you are looking for under [facility] and page to a number that you can be directly reached. If you still have difficulty reaching the provider, please page the [facility] (Director on Call) for the Hospitalists listed on amion for assistance.  03/01/2023, 4:50 PM

## 2023-03-01 NOTE — ED Notes (Signed)
Labeled urine specimen and culture sent to lab. ENMiles 

## 2023-03-01 NOTE — Progress Notes (Signed)
Notified MD Grandville Silos in regards to troponin at 326 at 1738, patient not currently having chest pain or shortness of breath and nausea/vomiting has resolved. Also notified, that patient's BP is refractory even with titrating up nitroglycerin infusion. MD Grandville Silos ordered hydralazine '10mg'$  once. Will continue to monitor BP every 3 mins due to nitro gtt infusion.

## 2023-03-01 NOTE — Progress Notes (Signed)
  Echocardiogram 2D Echocardiogram attempted at 1443. Pt is refusing exam at this time. Will attempt again later.  Eartha Inch 03/01/2023, 2:43 PM

## 2023-03-01 NOTE — ED Triage Notes (Signed)
Pt. BIB [facility] for flu like symptoms. Pt.has not taken any medication for relief. Pt. Has not been febrile.

## 2023-03-01 NOTE — ED Notes (Signed)
ED TO INPATIENT HANDOFF REPORT  ED Nurse Name and Phone #: Waynette Buttery Name/Age/Gender [name] 72 y.o. male Room/Bed: WA19/WA19  Code Status   Code Status: Prior  Home/SNF/Other Home Patient oriented to: self, place, time, and situation Is this baseline? No   Triage Complete: Triage complete  Chief Complaint Acute exacerbation of CHF (congestive heart failure) ([place]) [I50.9]  Triage Note Pt. BIB [facility] for flu like symptoms. Pt.has not taken any medication for relief. Pt. Has not been febrile.    Allergies No Known Allergies  Level of Care/Admitting Diagnosis ED Disposition     ED Disposition  Admit   Condition  --   Comment  Hospital Area: [place] [[id number]]  Level of Care: ICU [6]  May admit patient to [facility] or [facility] if equivalent level of care is available:: No  Covid Evaluation: Confirmed COVID Negative  Diagnosis: Acute exacerbation of CHF (congestive heart failure) [facility]) [id number]  Admitting Physician: Eugenie Filler [[id number]]  Attending Physician: Eugenie Filler [id number]  Certification:: I certify this patient will need inpatient services for at least 2 midnights  Estimated Length of Stay: 4          B Medical/Surgery History Past Medical History:  Diagnosis Date   Alcohol dependence ([place]) 12/10/2013   Angioedema of lips 12/24/2013   Brain tumor ([place])    CHF (congestive heart failure) ([facility])    diastolic   Chronic back pain    Chronic headache    CVA (cerebral vascular accident) ([place])    Diabetes mellitus    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   HTN (hypertension), malignant 09/06/2012   Hypertension    Narcotic abuse ([place])    Normal cardiac stress test 2009   TIA (transient ischemic attack) 09/06/2012   Tobacco use disorder 12/10/2013   UTI (urinary tract infection)    Vertigo 09/06/2012   Past Surgical History:  Procedure Laterality Date   NO PAST SURGERIES        A IV Location/Drains/Wounds Patient Lines/Drains/Airways Status     Active Line/Drains/Airways     Name Placement date Placement time Site Days   Peripheral IV 03/01/23 18 G Anterior;Distal;Right;Upper Arm 03/01/23  0745  Arm  less than 1            Intake/Output Last 24 hours No intake or output data in the 24 hours ending 03/01/23 1216  Labs/Imaging Results for orders placed or performed during the hospital encounter of 03/01/23 (from the past 48 hour(s))  Resp panel by RT-PCR (RSV, Flu A&B, Covid) Anterior Nasal Swab     Status: None   Collection Time: 03/01/23  5:38 AM   Specimen: Anterior Nasal Swab  Result Value Ref Range   SARS Coronavirus 2 by RT PCR NEGATIVE NEGATIVE    Comment: (NOTE) SARS-CoV-2 target nucleic acids are NOT DETECTED.  The SARS-CoV-2 RNA is generally detectable in upper respiratory specimens during the acute phase of infection. The lowest concentration of SARS-CoV-2 viral copies this assay can detect is 138 copies/mL. A negative result does not preclude SARS-Cov-2 infection and should not be used as the sole basis for treatment or other patient management decisions. A negative result may occur with  improper specimen collection/handling, submission of specimen other than nasopharyngeal swab, presence of viral mutation(s) within the areas targeted by this assay, and inadequate number of viral copies(<138 copies/mL). A negative result must be combined with clinical observations, patient history, and epidemiological information. The  expected result is Negative.  Fact Sheet for Patients:  EntrepreneurPulse.com.au  Fact Sheet for Healthcare Providers:  IncredibleEmployment.be  This test is no t yet approved or cleared by the [place] FDA and  has been authorized for detection and/or diagnosis of SARS-CoV-2 by FDA under an Emergency Use Authorization (EUA). This EUA will remain  in effect (meaning this  test can be used) for the duration of the COVID-19 declaration under Section 564(b)(1) of the Act, 21 U.S.C.section 360bbb-3(b)(1), unless the authorization is terminated  or revoked sooner.       Influenza A by PCR NEGATIVE NEGATIVE   Influenza B by PCR NEGATIVE NEGATIVE    Comment: (NOTE) The Xpert Xpress SARS-CoV-2/FLU/RSV plus assay is intended as an aid in the diagnosis of influenza from Nasopharyngeal swab specimens and should not be used as a sole basis for treatment. Nasal washings and aspirates are unacceptable for Xpert Xpress SARS-CoV-2/FLU/RSV testing.  Fact Sheet for Patients: EntrepreneurPulse.com.au  Fact Sheet for Healthcare Providers: IncredibleEmployment.be  This test is not yet approved or cleared by the [place] FDA and has been authorized for detection and/or diagnosis of SARS-CoV-2 by FDA under an Emergency Use Authorization (EUA). This EUA will remain in effect (meaning this test can be used) for the duration of the COVID-19 declaration under Section 564(b)(1) of the Act, 21 U.S.C. section 360bbb-3(b)(1), unless the authorization is terminated or revoked.     Resp Syncytial Virus by PCR NEGATIVE NEGATIVE    Comment: (NOTE) Fact Sheet for Patients: EntrepreneurPulse.com.au  Fact Sheet for Healthcare Providers: IncredibleEmployment.be  This test is not yet approved or cleared by the [place] FDA and has been authorized for detection and/or diagnosis of SARS-CoV-2 by FDA under an Emergency Use Authorization (EUA). This EUA will remain in effect (meaning this test can be used) for the duration of the COVID-19 declaration under Section 564(b)(1) of the Act, 21 U.S.C. section 360bbb-3(b)(1), unless the authorization is terminated or revoked.  Performed at [facility], [place] [address] [place], [place] [address]   CBC with Differential     Status:  Abnormal   Collection Time: 03/01/23  6:57 AM  Result Value Ref Range   WBC 9.5 4.0 - 10.5 K/uL   RBC 5.82 (H) 4.22 - 5.81 MIL/uL   Hemoglobin 12.9 (L) 13.0 - 17.0 g/dL   HCT 42.0 39.0 - 52.0 %   MCV 72.2 (L) 80.0 - 100.0 fL   MCH 22.2 (L) 26.0 - 34.0 pg   MCHC 30.7 30.0 - 36.0 g/dL   RDW 15.6 (H) 11.5 - 15.5 %   Platelets 263 150 - 400 K/uL   nRBC 0.0 0.0 - 0.2 %   Neutrophils Relative % 61 %   Neutro Abs 5.7 1.7 - 7.7 K/uL   Lymphocytes Relative 27 %   Lymphs Abs 2.6 0.7 - 4.0 K/uL   Monocytes Relative 8 %   Monocytes Absolute 0.8 0.1 - 1.0 K/uL   Eosinophils Relative 3 %   Eosinophils Absolute 0.3 0.0 - 0.5 K/uL   Basophils Relative 1 %   Basophils Absolute 0.1 0.0 - 0.1 K/uL   Immature Granulocytes 0 %   Abs Immature Granulocytes 0.03 0.00 - 0.07 K/uL    Comment: Performed at [facility], [place] [address], [place], [place] [address]  Comprehensive metabolic panel     Status: Abnormal   Collection Time: 03/01/23  6:57 AM  Result Value Ref Range   Sodium 140 135 - 145 mmol/L  Potassium 3.2 (L) 3.5 - 5.1 mmol/L   Chloride 110 98 - 111 mmol/L   CO2 23 22 - 32 mmol/L   Glucose, Bld 108 (H) 70 - 99 mg/dL    Comment: Glucose reference range applies only to samples taken after fasting for at least 8 hours.   BUN 19 8 - 23 mg/dL   Creatinine, Ser 0.80 0.61 - 1.24 mg/dL   Calcium 8.9 8.9 - 10.3 mg/dL   Total Protein 7.9 6.5 - 8.1 g/dL   Albumin 3.7 3.5 - 5.0 g/dL   AST 20 15 - 41 U/L   ALT 13 0 - 44 U/L   Alkaline Phosphatase 60 38 - 126 U/L   Total Bilirubin 0.9 0.3 - 1.2 mg/dL   GFR, Estimated >60 >60 mL/min    Comment: (NOTE) Calculated using the CKD-EPI Creatinine Equation (2021)    Anion gap 7 5 - 15    Comment: Performed at [facility], [place] [address], [place], [place] [address]  Brain natriuretic peptide     Status: Abnormal   Collection Time: 03/01/23  6:57 AM  Result Value Ref Range   B Natriuretic Peptide 894.6 (H) 0.0 -  100.0 pg/mL    Comment: Performed at [facility], [place] [address], [place], [place] [address]  Urinalysis, Routine w reflex microscopic -Urine, Clean Catch     Status: Abnormal   Collection Time: 03/01/23 10:32 AM  Result Value Ref Range   Color, Urine AMBER (A) YELLOW    Comment: BIOCHEMICALS MAY BE AFFECTED BY COLOR   APPearance CLOUDY (A) CLEAR   Specific Gravity, Urine 1.009 1.005 - 1.030   pH 8.0 5.0 - 8.0   Glucose, UA NEGATIVE NEGATIVE mg/dL   Hgb urine dipstick SMALL (A) NEGATIVE   Bilirubin Urine NEGATIVE NEGATIVE   Ketones, ur NEGATIVE NEGATIVE mg/dL   Protein, ur 100 (A) NEGATIVE mg/dL   Nitrite NEGATIVE NEGATIVE   Leukocytes,Ua NEGATIVE NEGATIVE   RBC / HPF 0-5 0 - 5 RBC/hpf   WBC, UA 21-50 0 - 5 WBC/hpf   Bacteria, UA MANY (A) NONE SEEN   Squamous Epithelial / HPF 0-5 0 - 5 /HPF   Mucus PRESENT     Comment: Performed at [facility], [place] [address] [place], [place] [address]   DG Chest 2 View  Result Date: 03/01/2023 CLINICAL DATA:  Cough, flu like symptoms EXAM: CHEST - 2 VIEW COMPARISON:  Prior chest x-ray 12/14/2022 FINDINGS: Significant enlargement of the cardiopericardial silhouette appears greater than previously seen. Additionally, fine interstitial prominence is present throughout the lungs with mild pulmonary vascular congestion. No overt pulmonary edema. No pleural effusion or pneumothorax. No focal airspace infiltrate. IMPRESSION: 1. Progressive enlargement of the cardiopericardial silhouette concerning for increasing cardiomegaly. 2. Pulmonary vascular congestion with diffuse fine interstitial prominence. Differential considerations include early interstitial edema versus atypical/viral respiratory infection. Electronically Signed   By: Jacqulynn Cadet M.D.   On: 03/01/2023 06:27    Pending Labs Unresulted Labs (From admission, onward)     Start     Ordered   03/01/23 1154  Urine Culture  Once,   R        03/01/23 1154    03/01/23 1148  Magnesium  Add-on,   AD        03/01/23 1147            Vitals/Pain Today's Vitals   03/01/23 0700 03/01/23 0751 03/01/23 0834 03/01/23 1107  BP: (!) 177/46  Marland Kitchen)  201/53 (!) 193/55  Pulse: 60  (!) 57 85  Resp:   12 14  Temp:   99 F (37.2 C) 98.9 F (37.2 C)  TempSrc:   Oral Oral  SpO2: 94%  95% 97%  PainSc:  7       Isolation Precautions Airborne and Contact precautions  Medications Medications  nitroGLYCERIN 50 mg in dextrose 5 % 250 mL (0.2 mg/mL) infusion (40 mcg/min Intravenous Rate/Dose Change 03/01/23 1212)  potassium chloride SA (KLOR-CON M) CR tablet 40 mEq (has no administration in time range)  cefTRIAXone (ROCEPHIN) 1 g in sodium chloride 0.9 % 100 mL IVPB (has no administration in time range)  acetaminophen (TYLENOL) tablet 1,000 mg (1,000 mg Oral Given 03/01/23 0547)  furosemide (LASIX) injection 40 mg (40 mg Intravenous Given 03/01/23 0954)  hydrALAZINE (APRESOLINE) injection 20 mg (20 mg Intravenous Given 03/01/23 0954)  furosemide (LASIX) injection 40 mg (40 mg Intravenous Given 03/01/23 1143)    Mobility walks     Focused Assessments Cardiac Assessment Handoff:    Lab Results  Component Value Date   CKTOTAL 61 02/16/2020   CKMB 2.4 11/10/2010   TROPONINI 0.07 (HH) 11/02/2016   Lab Results  Component Value Date   DDIMER 0.44 03/18/2015   Does the Patient currently have chest pain? No    R Recommendations: See Admitting Provider Note  Report given to:   Additional Notes:

## 2023-03-01 NOTE — ED Provider Notes (Incomplete)
  Physical Exam  BP (!) 167/44   Pulse 67   Temp 98.5 F (36.9 C) (Oral)   Resp 18   SpO2 95%   Physical Exam  Procedures  Procedures  ED Course / MDM    Medical Decision Making Amount and/or Complexity of Data Reviewed Labs: ordered. Radiology: ordered.  Risk OTC drugs. Prescription drug management.   72 yo male presents with cough and congestion CXR edema vs atypical infection HO diastolic hf  CHF exacerbation labs ordered BNP elevated Lasix and

## 2023-03-01 NOTE — ED Provider Notes (Signed)
  Physical Exam  BP (!) 193/55 (BP Location: Right Arm)   Pulse 85   Temp 98.9 F (37.2 C) (Oral)   Resp 14   SpO2 97%   Physical Exam  Procedures  .Critical Care  Performed by: Pattricia Boss, MD Authorized by: Pattricia Boss, MD   Critical care provider statement:    Critical care time (minutes):  60   Critical care was necessary to treat or prevent imminent or life-threatening deterioration of the following conditions:  Respiratory failure   Critical care was time spent personally by me on the following activities:  Development of treatment plan with patient or surrogate, discussions with consultants, evaluation of patient's response to treatment, examination of patient, ordering and review of laboratory studies, ordering and review of radiographic studies, ordering and performing treatments and interventions, pulse oximetry, re-evaluation of patient's condition and review of old charts   ED Course / MDM   Clinical Course as of 03/01/23 1157  Sun Mar 01, 2023  1108 BNP elevated at 894 [DR]    Clinical Course User Index [DR] Pattricia Boss, MD   Medical Decision Making Amount and/or Complexity of Data Reviewed Labs: ordered. Radiology: ordered.  Risk OTC drugs. Prescription drug management.   72 yo male presents with cough and congestion CXR edema vs atypical infection HO diastolic hf  CHF exacerbation labs ordered   BNP elevated, blood pressure elevated Patient has received Lasix 40 mg/h approximately 600 cc of urine he has received hydralazine 20 mg IV.  Blood pressure has improved from systolic [id number] to low 1 and 193.  Given the blood pressure It is unclear if this is accurate with last blood pressure being 193/55 Patient is given 1 dose of Lasix and IV nitro was added for his dyspnea. Oxygen saturations have been in the 93 to 95% and he is placed on 2 L of oxygen  Chest x[facility] without acute infiltrates, white blood cell count normal,  Viral panel  negative Suspect worsening CHF in this patient although viral syndrome could be contributing Patient with uncontrolled hypertension unclear if he has been taking his medications or which ones he has been taking Patient treated here for 1- respiratory distress/hypoxia-now requiring nasal cannula 2 consider viral etiology although entered COVID, flu, RSV negative and no leukocytosis patient has had some chills but no fever 3 probable CHF 4 hypertensive urgency  Patient co abdominal pain after voiding. Urine with few bacteria and 21-50 white blood cells with 0-5 squamous epithelial cells Will culture will [place] with Ceftin here Discussed with Dr. [place] Silos who will see for admission      Pattricia Boss, MD 03/01/23 1157

## 2023-03-01 NOTE — ED Provider Notes (Signed)
[facility] Provider Note  CSN: [id number] Arrival date & time: 03/01/23 [id number]  Chief Complaint(s) No chief complaint on file.  HPI [name] is a 72 y.o. male with a past medical history listed below including type 2 diabetes on metformin, diastolic heart failure, HOCM, and tobacco use disorder who presents to the emergency department with several days of rhinorrhea, nasal congestion and cough.  He denies any fevers or chills.  No nausea or vomiting.  No chest pain or shortness of breath.  No peripheral edema. Not currently on diuretics.  The history is provided by the patient.    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/2020   HTN (hypertension), malignant 09/06/2012   Hypertension    Narcotic abuse ([place])    Normal cardiac stress test 2009   TIA (transient ischemic attack) 09/06/2012   Tobacco use disorder 12/10/2013   UTI (urinary tract infection)    Vertigo 09/06/2012   Patient Active Problem List   Diagnosis Date Noted   UTI (urinary tract infection) 03/22/2022   Hyponatremia    CVA (cerebral vascular accident) ([place])    Complicated UTI (urinary tract infection) 02/18/2020   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[place]) 02/18/2020   Epigastric abdominal pain    Epigastric pain 02/16/2020   Hypertensive urgency 02/16/2020   Cardiac murmur 02/16/2020   Acute pyelonephritis 02/16/2020   Syncope 01/23/2017   Elevated troponin    Nausea 11/01/2016   Dysuria 11/01/2016   Chronic diastolic heart failure ([place]) 03/20/2015   Chest pain 03/18/2015   History of TIA (transient ischemic attack)  01/27/2015   Enterococcus UTI 01/27/2015   Cough 01/27/2015   Bronchitis 01/27/2015   HCAP (healthcare-associated pneumonia)    CHF (congestive heart failure) ([place]) 01/26/2015   History of alcohol abuse 01/26/2015   Pyelonephritis 01/12/2015   Prostatitis 01/12/2015   Hypertension 11/03/2014   Hypokalemia 11/03/2014   Fever 08/15/2014   Chronic pain 12/24/2013   Tobacco use disorder 12/10/2013   Leukocytosis 12/10/2013   Controlled type 2 diabetes mellitus without complication, without long-term current use of insulin ([place]) 09/06/2012   Pain in the side 09/06/2012   Pituitary adenoma ([place]) 09/06/2012   Home Medication(s) Prior to Admission medications   Medication Sig Start Date End Date Taking? Authorizing Provider  albuterol (PROVENTIL HFA;VENTOLIN HFA) 108 (90 Base) MCG/ACT inhaler Inhale 1-2 puffs into the lungs every 6 (six) hours as needed for wheezing. 09/04/16   Tanna Furry, MD  amLODipine (NORVASC) 10 MG tablet Take 1 tablet (10 mg total) by mouth daily. 03/25/22   Regalado, Jerald Kief A, MD  aspirin EC 81 MG tablet Take 81 mg by mouth every morning.    [provider]  cephALEXin (KEFLEX) 500 MG capsule Take 1 capsule (500 mg total) by mouth 4 (four) times daily. 08/19/22   Pattricia Boss, MD  cephALEXin (KEFLEX) 500 MG capsule Take 2 capsules (1,000 mg total) by mouth 2 (two) times daily. 12/14/22   Charlesetta Shanks, MD  diclofenac Sodium (VOLTAREN) 1 % GEL Apply 1 application. topically 4 (four) times daily as needed (pain). 12/27/21   [provider]  diltiazem (CARDIZEM CD) 120 MG 24 hr capsule Take 1 capsule (120 mg total) by mouth daily. 03/25/22 03/25/23  Regalado, Belkys A, MD  doxazosin (CARDURA) 4 MG tablet Take 1 tablet (4 mg total) by mouth daily. 03/25/22   Regalado, Belkys A, MD  hydrALAZINE (APRESOLINE) 100 MG tablet Take 1 tablet (100 mg total) by mouth every 8 (eight) hours. 03/25/22   Regalado, Belkys A, MD  losartan (COZAAR) 100 MG tablet Take 1 tablet  (100 mg total) by mouth daily. 03/25/22   Regalado, Belkys A, MD  metFORMIN (GLUCOPHAGE) 500 MG tablet Take 1 tablet by mouth twice a day Patient taking differently: Take 500 mg by mouth 2 (two) times daily. 06/04/21     nicotine (NICODERM CQ - DOSED IN MG/24 HOURS) 21 mg/24hr patch Place 1 patch (21 mg total) onto the skin daily. Patient taking differently: Place 21 mg onto the skin daily as needed (smoking cessation). 02/21/20   Eugenie Filler, MD  omeprazole (PRILOSEC) 20 MG capsule Take 1 capsule (20 mg total) by mouth daily. 12/14/22   Charlesetta Shanks, MD  ondansetron (ZOFRAN-ODT) 8 MG disintegrating tablet Take 1 tablet (8 mg total) by mouth every 8 (eight) hours as needed for nausea or vomiting. 08/19/22   Pattricia Boss, MD  pantoprazole (PROTONIX) 40 MG tablet Take 1 tablet (40 mg total) by mouth daily. Patient not taking: Reported on 03/23/2022 02/20/20   Eugenie Filler, MD  pravastatin (PRAVACHOL) 20 MG tablet Take 20 mg by mouth in the morning. 02/18/22   [provider]  sucralfate (CARAFATE) 1 GM/10ML suspension Take 10 mLs (1 g total) by mouth 4 (four) times daily -  with meals and at bedtime. 12/14/22   Charlesetta Shanks, MD  tamsulosin (FLOMAX) 0.4 MG CAPS capsule TAKE TWO CAPSULES BY MOUTH AT BEDTIME 03/25/22   Regalado, Belkys A, MD  traMADol (ULTRAM) 50 MG tablet Take 1 tablet by mouth twice a day as needed for severe pains Patient taking differently: Take 50 mg by mouth 2 (two) times daily as needed for severe pain. 06/04/21                                                                                                                                       Allergies Patient has no known allergies.  Review of Systems Review of Systems As noted in HPI  Physical Exam Vital Signs  I have reviewed the triage vital signs BP (!) 177/46   Pulse 60   Temp 98.5 F (36.9 C) (Oral)   Resp 18   SpO2 94%   Physical Exam Vitals reviewed.  Constitutional:      General: He  is not in acute distress.    Appearance: He is well-developed. He is not diaphoretic.  HENT:     Head: Normocephalic and atraumatic.     Right Ear: Tympanic membrane normal.  Left Ear: Tympanic membrane normal.     Nose: Rhinorrhea present.  Eyes:     General: No scleral icterus.       Right eye: No discharge.        Left eye: No discharge.     Conjunctiva/sclera: Conjunctivae normal.     Pupils: Pupils are equal, round, and reactive to light.  Cardiovascular:     Rate and Rhythm: Normal rate and regular rhythm.     Heart sounds: No murmur heard.    No friction rub. No gallop.  Pulmonary:     Effort: Pulmonary effort is normal. No respiratory distress.     Breath sounds: Normal breath sounds. No stridor. No rales.  Abdominal:     General: There is no distension.     Palpations: Abdomen is soft.     Tenderness: There is no abdominal tenderness.  Musculoskeletal:        General: No tenderness.     Cervical back: Normal range of motion and neck supple.  Skin:    General: Skin is warm and dry.     Findings: No erythema or rash.  Neurological:     Mental Status: He is alert and oriented to person, place, and time.     ED Results and Treatments Labs (all labs ordered are listed, but only abnormal results are displayed) Labs Reviewed  RESP PANEL BY RT-PCR (RSV, FLU A&B, COVID)  [id number]  CBC WITH DIFFERENTIAL/PLATELET  COMPREHENSIVE METABOLIC PANEL  BRAIN NATRIURETIC PEPTIDE                                                                                                                         EKG  EKG Interpretation  Date/Time:    Ventricular Rate:    PR Interval:    QRS Duration:   QT Interval:    QTC Calculation:   R Axis:     Text Interpretation:         Radiology DG Chest 2 View  Result Date: 03/01/2023 CLINICAL DATA:  Cough, flu like symptoms EXAM: CHEST - 2 VIEW COMPARISON:  Prior chest x-ray 12/14/2022 FINDINGS: Significant enlargement of the  cardiopericardial silhouette appears greater than previously seen. Additionally, fine interstitial prominence is present throughout the lungs with mild pulmonary vascular congestion. No overt pulmonary edema. No pleural effusion or pneumothorax. No focal airspace infiltrate. IMPRESSION: 1. Progressive enlargement of the cardiopericardial silhouette concerning for increasing cardiomegaly. 2. Pulmonary vascular congestion with diffuse fine interstitial prominence. Differential considerations include early interstitial edema versus atypical/viral respiratory infection. Electronically Signed   By: Jacqulynn Cadet M.D.   On: 03/01/2023 06:27    Medications Ordered in ED Medications  acetaminophen (TYLENOL) tablet 1,000 mg (1,000 mg Oral Given 03/01/23 0547)  Procedures Procedures  (including critical care time)  Medical Decision Making / ED Course  Click here for ABCD2, HEART and other calculators  Medical Decision Making Amount and/or Complexity of Data Reviewed Labs: ordered. Decision-making details documented in ED Course. Radiology: ordered and independent interpretation performed. Decision-making details documented in ED Course. ECG/medicine tests: ordered.  Risk OTC drugs.   Patient presents with viral symptoms for 2-3 days. Adequate oral hydration. Rest of history as above.  Patient appears well. No signs of toxicity, patient is interactive. No hypoxia, tachypnea or other signs of respiratory distress. No sign of clinical dehydration. Lung exam clear. Rest of exam as above.  Most consistent with viral illness   Viral panel was negative for COVID/influenza/RSV. Chest x-ray without evidence of pneumonia.  It did reveal more prominent cardiomegaly with evidence of either mild interstitial edema or atypical/viral pneumonia.  Based on patient's symptoms is  most likely viral pneumonia but given his history, will will expand workup to rule out CHF exacerbation.  Patient care turned over to oncoming provider. Patient case and results discussed in detail; please see their note for further ED managment.        Final Clinical Impression(s) / ED Diagnoses Final diagnoses:  None           This chart was dictated using voice recognition software.  Despite best efforts to proofread,  errors can occur which can change the documentation meaning.    Fatima Blank, MD 03/01/23 [phone]

## 2023-03-02 ENCOUNTER — Inpatient Hospital Stay (HOSPITAL_COMMUNITY): Payer: Medicare HMO

## 2023-03-02 DIAGNOSIS — I5031 Acute diastolic (congestive) heart failure: Secondary | ICD-10-CM

## 2023-03-02 DIAGNOSIS — E119 Type 2 diabetes mellitus without complications: Secondary | ICD-10-CM | POA: Diagnosis not present

## 2023-03-02 DIAGNOSIS — I5033 Acute on chronic diastolic (congestive) heart failure: Secondary | ICD-10-CM | POA: Diagnosis not present

## 2023-03-02 DIAGNOSIS — E785 Hyperlipidemia, unspecified: Secondary | ICD-10-CM

## 2023-03-02 DIAGNOSIS — I5032 Chronic diastolic (congestive) heart failure: Secondary | ICD-10-CM | POA: Diagnosis not present

## 2023-03-02 DIAGNOSIS — I161 Hypertensive emergency: Secondary | ICD-10-CM

## 2023-03-02 DIAGNOSIS — N3000 Acute cystitis without hematuria: Secondary | ICD-10-CM

## 2023-03-02 DIAGNOSIS — N400 Enlarged prostate without lower urinary tract symptoms: Secondary | ICD-10-CM | POA: Diagnosis present

## 2023-03-02 DIAGNOSIS — N401 Enlarged prostate with lower urinary tract symptoms: Secondary | ICD-10-CM | POA: Diagnosis present

## 2023-03-02 LAB — RESPIRATORY PANEL BY PCR

## 2023-03-02 LAB — BASIC METABOLIC PANEL
Anion gap: 12 (ref 5–15)
BUN: 22 mg/dL (ref 8–23)
CO2: 22 mmol/L (ref 22–32)
Calcium: 9 mg/dL (ref 8.9–10.3)
Chloride: 107 mmol/L (ref 98–111)
Creatinine, Ser: 0.99 mg/dL (ref 0.61–1.24)
GFR, Estimated: >60 mL/min (ref >60)
Glucose, Bld: 139 mg/dL — ABNORMAL HIGH (ref 70–99)
Potassium: 2.8 mmol/L — ABNORMAL LOW (ref 3.5–5.1)
Sodium: 141 mmol/L (ref 135–145)

## 2023-03-02 LAB — ECHOCARDIOGRAM COMPLETE
Area-P 1/2: 4.12 cm2
Calc EF: 69.2 %
Height: 69 in
P 1/2 time: 587 msec
S' Lateral: 2.2 cm
Single Plane A2C EF: 67 %
Single Plane A4C EF: 71.8 %
Weight: 2402.13 oz

## 2023-03-02 LAB — CBC WITH DIFFERENTIAL/PLATELET
Abs Immature Granulocytes: 0.06 10*3/uL (ref 0.00–0.07)
Basophils Absolute: 0 10*3/uL (ref 0.0–0.1)
Basophils Relative: 0 %
Eosinophils Absolute: 0 10*3/uL (ref 0.0–0.5)
Eosinophils Relative: 0 %
HCT: 37.7 % — ABNORMAL LOW (ref 39.0–52.0)
Hemoglobin: 11.8 g/dL — ABNORMAL LOW (ref 13.0–17.0)
Immature Granulocytes: 0 %
Lymphocytes Relative: 15 %
Lymphs Abs: 2.1 10*3/uL (ref 0.7–4.0)
MCH: 22.3 pg — ABNORMAL LOW (ref 26.0–34.0)
MCHC: 31.3 g/dL (ref 30.0–36.0)
MCV: 71.3 fL — ABNORMAL LOW (ref 80.0–100.0)
Monocytes Absolute: 1 10*3/uL (ref 0.1–1.0)
Monocytes Relative: 7 %
Neutro Abs: 10.5 10*3/uL — ABNORMAL HIGH (ref 1.7–7.7)
Neutrophils Relative %: 78 %
Platelets: 293 10*3/uL (ref 150–400)
RBC: 5.29 MIL/uL (ref 4.22–5.81)
RDW: 15.1 % (ref 11.5–15.5)
WBC: 13.6 10*3/uL — ABNORMAL HIGH (ref 4.0–10.5)
nRBC: 0 % (ref 0.0–0.2)

## 2023-03-02 LAB — LIPID PANEL
Cholesterol: 207 mg/dL — ABNORMAL HIGH (ref 0–200)
HDL: 35 mg/dL — ABNORMAL LOW (ref >40)
LDL Cholesterol: 152 mg/dL — ABNORMAL HIGH (ref 0–99)
Total CHOL/HDL Ratio: 5.9 RATIO
Triglycerides: 98 mg/dL (ref <150)
VLDL: 20 mg/dL (ref 0–40)

## 2023-03-02 LAB — TROPONIN I (HIGH SENSITIVITY): Troponin I (High Sensitivity): 191 ng/L (ref <18)

## 2023-03-02 LAB — MRSA NEXT GEN BY PCR, NASAL: MRSA by PCR Next Gen: NOT DETECTED

## 2023-03-02 LAB — GLUCOSE, CAPILLARY
Glucose-Capillary: 147 mg/dL — ABNORMAL HIGH (ref 70–99)
Glucose-Capillary: 169 mg/dL — ABNORMAL HIGH (ref 70–99)

## 2023-03-02 LAB — HEMOGLOBIN A1C
Hgb A1c MFr Bld: 6.2 % — ABNORMAL HIGH (ref 4.8–5.6)
Mean Plasma Glucose: 131 mg/dL

## 2023-03-02 LAB — MAGNESIUM: Magnesium: 2.2 mg/dL (ref 1.7–2.4)

## 2023-03-02 LAB — BRAIN NATRIURETIC PEPTIDE: B Natriuretic Peptide: 764.5 pg/mL — ABNORMAL HIGH (ref 0.0–100.0)

## 2023-03-02 MED ORDER — ONDANSETRON 4 MG PO TBDP: 4.0000 mg | ORAL_TABLET | Freq: Four times a day (QID) | ORAL | Status: DC | PRN

## 2023-03-02 MED ORDER — NAPROXEN 250 MG PO TABS: 500.0000 mg | ORAL_TABLET | Freq: Two times a day (BID) | ORAL | Status: DC | PRN

## 2023-03-02 MED ORDER — DICYCLOMINE HCL 20 MG PO TABS
20.0000 mg | ORAL_TABLET | Freq: Four times a day (QID) | ORAL | Status: DC | PRN
  Administered 2023-03-04 – 2023-03-05 (×2): 20 mg via ORAL
  Filled 2023-03-02 (×3): qty 1

## 2023-03-02 MED ORDER — METHOCARBAMOL 500 MG PO TABS
500.0000 mg | ORAL_TABLET | Freq: Three times a day (TID) | ORAL | Status: DC | PRN
  Administered 2023-03-02 – 2023-03-04 (×3): 500 mg via ORAL
  Filled 2023-03-02 (×4): qty 1

## 2023-03-02 MED ORDER — CARVEDILOL 6.25 MG PO TABS: 6.2500 mg | ORAL_TABLET | Freq: Two times a day (BID) | ORAL | Status: DC

## 2023-03-02 MED ORDER — LOPERAMIDE HCL 2 MG PO CAPS: 2.0000 mg | ORAL_CAPSULE | ORAL | Status: DC | PRN

## 2023-03-02 MED ORDER — ATORVASTATIN CALCIUM 40 MG PO TABS
40.0000 mg | ORAL_TABLET | Freq: Every day | ORAL | Status: DC
  Administered 2023-03-02 – 2023-03-05 (×4): 40 mg via ORAL
  Filled 2023-03-02 (×4): qty 1

## 2023-03-02 MED ORDER — AMLODIPINE BESYLATE 5 MG PO TABS
5.0000 mg | ORAL_TABLET | Freq: Every day | ORAL | Status: DC
  Administered 2023-03-02: 5 mg via ORAL
  Filled 2023-03-02: qty 1

## 2023-03-02 MED ORDER — POTASSIUM CHLORIDE CRYS ER 20 MEQ PO TBCR
40.0000 meq | EXTENDED_RELEASE_TABLET | ORAL | Status: AC
  Administered 2023-03-02 (×2): 40 meq via ORAL
  Filled 2023-03-02 (×2): qty 2

## 2023-03-02 MED ORDER — POTASSIUM CHLORIDE 10 MEQ/100ML IV SOLN
10.0000 meq | INTRAVENOUS | Status: DC
  Administered 2023-03-02: 10 meq via INTRAVENOUS
  Filled 2023-03-02: qty 100

## 2023-03-02 MED ORDER — ONDANSETRON HCL 4 MG/2ML IJ SOLN
4.0000 mg | Freq: Four times a day (QID) | INTRAMUSCULAR | Status: DC | PRN
  Administered 2023-03-02 – 2023-03-05 (×3): 4 mg via INTRAVENOUS
  Filled 2023-03-02 (×3): qty 2

## 2023-03-02 MED ORDER — CARVEDILOL 6.25 MG PO TABS
6.2500 mg | ORAL_TABLET | Freq: Two times a day (BID) | ORAL | Status: DC
  Administered 2023-03-02 (×2): 6.25 mg via ORAL
  Filled 2023-03-02 (×2): qty 1

## 2023-03-02 MED ORDER — POTASSIUM CHLORIDE CRYS ER 20 MEQ PO TBCR: 40.0000 meq | EXTENDED_RELEASE_TABLET | ORAL | Status: DC

## 2023-03-02 MED ORDER — HYDROXYZINE HCL 25 MG PO TABS
25.0000 mg | ORAL_TABLET | Freq: Four times a day (QID) | ORAL | Status: DC | PRN
  Administered 2023-03-02 – 2023-03-03 (×3): 25 mg via ORAL
  Filled 2023-03-02 (×3): qty 1

## 2023-03-02 NOTE — Progress Notes (Signed)
Standard foley catheter insertion unsuccessful d/t resistance/pain. Pt has known history of BPH. Dr Grandville Silos notified by this RN and orders received for Coude catheter insertion. Urology floor notified by charge RN, awaiting order completion at this time.

## 2023-03-02 NOTE — Progress Notes (Signed)
PROGRESS NOTE    [name]  [id number] DOB: [birth year] DOA: 03/01/2023 PCP: Nolene Ebbs, MD    No chief complaint on file.   Brief Narrative: Patient 72 year old gentleman history of HOCM, prior history of alcohol dependence, history of brain tumor, history of chronic diastolic CHF, chronic back pain, chronic headaches, prior history of CVA, type 2 diabetes, hypertension, prior history of narcotic abuse, ongoing tobacco use,?  BPH who presented to the ED with 2-day history of rhinorrhea, nasal congestion, shortness of breath.  Workup concerning for hypertensive emergency in the setting of CHF exacerbation, probable UTI.  Patient placed on the nitro drip, IV antibiotics, 2D echo ordered.  Cardiology consulted.   Assessment & Plan:   Principal Problem:   Acute exacerbation of CHF (congestive heart failure) (HCC) Active Problems:   Hypertensive emergency   Controlled type 2 diabetes mellitus without complication, without long-term current use of insulin (HCC)   Tobacco use disorder   Hypokalemia   History of TIA (transient ischemic attack)   Chronic diastolic heart failure (HCC)   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HCC)   UTI (urinary tract infection)   CVA (cerebral vascular accident) ([place])   Hypomagnesemia   Benign prostatic hyperplasia   Hyperlipidemia  #1 acute on chronic diastolic CHF exacerbation/Hx HOCM -Likely secondary to hypertensive emergency -Patient presented with a 2-day history of upper respiratory symptoms including rhinorrhea, nasal congestion, cough, shortness of breath. -Respiratory viral panel with SARS coronavirus 2 negative, influenza A AND B negative, RSV negative. -Patient noted to have elevated BNP of 894.6. -Chest x-ray with increasing cardiopericardial silhouette concern for volume overload. -Patient noted to be in hypertensive emergency on presentation knows he only takes 1 blood pressure medication however per med rec looks like  he is supposed to be on more than 1 antihypertensive medication. -Urine output of 725 cc over the past 24 hours. -Respiratory viral panel pending. -Cardiac enzymes elevated and trending down. -2D echo pending. -Continue nitroglycerin drip, IV Lasix 40 mg every 12 hours.   -Continue Claritin, Flonase, Mucinex, PPI, ARB.   -Diltiazem discontinued by cardiology this morning the patient placed on Coreg and Norvasc.   -Strict I's and O's, daily weights. -Supportive care.   2.  Hypertensive emergency -??  Etiology. -Patient not sure of which antihypertensive medications he is on however states he takes an antihypertensive medication 3 times a day. -Med rec looks like he is supposed to be on more than 1 antihypertensive medication. -Patient presented in CHF exacerbation. -Cardiac enzymes noted to be elevated but trending down.   -UDS negative.   -2D echo pending.   -Per RN family states patient uses heroin.  -Continue nitroglycerin drip with goal systolic blood pressure 123456 to 180s. -Seen by cardiology, Cardizem discontinued started on Coreg and Norvasc. -Continue IV Lasix, continue ARB. -Patient noted with nausea yesterday and unable to take oral pills. -Appreciate cardiology input and recommendations.  3.  Elevated troponins -Likely secondary to demand ischemia from hypertensive emergency. -Troponins trending down. -2D echo ordered and pending. -Cardiology consulteD   4.??  UTI -Urine cultures pending. -Continue IV Rocephin.   5.  Hypokalemia/hypomagnesemia -Potassium at 2.8 this morning.   -Magnesium at 2.2.   -Replete potassium.     6.  History of CVA -Continue aspirin for secondary stroke prophylaxis. -Change statin to Lipitor 40 mg daily.   7.  Diabetes mellitus type 2 -Hemoglobin A1c 6.6 (03/22/2022 ) -Repeat hemoglobin A1c pending.   -Continue to hold oral hypoglycemic agents.   -  SSI.   8.  Tobacco abuse -Tobacco cessation. -Nicotine patch.  9.   Hyperlipidemia -Fasting lipid panel with an LDL of 152. -Unsure of medication compliance. -Patient started on Pravachol. -Change Pravachol to Lipitor 40 mg daily.  10.?  BPH -Check a bladder scan. -Continue Flomax 0.8 mg nightly.  11.  History of polysubstance abuse -Place on the clonidine detox protocol. -Supportive care.        DVT prophylaxis: Lovenox Code Status: Full Family Communication: Updated patient and niece at bedside. Disposition: Remain in ICU  Status is: Inpatient Remains inpatient appropriate because: Severity of illness   Consultants:  Cardiology  Procedures:  Chest x-ray 03/01/2023 2D echo pending  Antimicrobials:  IV Rocephin 03/01/2023>>>>>   Subjective: Patient drowsy but arousable.  Noted to have received Ativan in the early hours of the morning for nausea.  Denies any chest pain.  Feels better than he did on admission.  Denies any abdominal pain.  Still on a nitroglycerin drip with elevated blood pressures requiring IV hydralazine this morning per RN.  Patient noted to have been unable to tolerate oral medications yesterday per RN due to nausea.  Objective: Vitals:   03/02/23 0815 03/02/23 0830 03/02/23 0845 03/02/23 0900  BP: (!) 177/58 (!) 184/56 (!) 171/56 (!) 176/56  Pulse: (!) 108 (!) 105 (!) 112 (!) 110  Resp: (!) 28 (!) 25 (!) 25 (!) 27  Temp:      TempSrc:      SpO2: 96% 95% 95% 97%  Weight:      Height:        Intake/Output Summary (Last 24 hours) at 03/02/2023 1009 Last data filed at 03/02/2023 0819 Gross per 24 hour  Intake 1210.61 ml  Output 725 ml  Net 485.61 ml   Filed Weights   03/01/23 1515 03/02/23 0144  Weight: 67.4 kg 68.1 kg    Examination:  General exam: Drowsy but arousable. Respiratory system: Some decreased breath sounds in the bases otherwise clear.  No wheezing, no rhonchi.  Fair air movement.  Speaking in full sentences.  Cardiovascular system: Tachycardia.  No JVD, no murmurs rubs or gallops.  No lower  extremity edema.  Gastrointestinal system: Abdomen is nondistended, soft and nontender. No organomegaly or masses felt. Normal bowel sounds heard. Central nervous system: Drowsy.  Moving extremities spontaneously.  No focal neurological deficits.   Extremities: Symmetric 5 x 5 power. Skin: No rashes, lesions or ulcers Psychiatry: Judgement and insight appear fair. Mood & affect appropriate.     Data Reviewed: I have personally reviewed following labs and imaging studies  CBC: Recent Labs  Lab 03/01/23 0657 03/02/23 0801  WBC 9.5 13.6*  NEUTROABS 5.7 10.5*  HGB 12.9* 11.8*  HCT 42.0 37.7*  MCV 72.2* 71.3*  PLT 263 0000000    Basic Metabolic Panel: Recent Labs  Lab 03/01/23 0657 03/01/23 1233 03/02/23 0801  NA 140  --  141  K 3.2*  --  2.8*  CL 110  --  107  CO2 23  --  22  GLUCOSE 108*  --  139*  BUN 19  --  22  CREATININE 0.80  --  0.99  CALCIUM 8.9  --  9.0  MG  --  1.5* 2.2    GFR: Estimated Creatinine Clearance: 66.9 mL/min (by C-G formula based on SCr of 0.99 mg/dL).  Liver Function Tests: Recent Labs  Lab 03/01/23 0657  AST 20  ALT 13  ALKPHOS 60  BILITOT 0.9  PROT 7.9  ALBUMIN  3.7    CBG: Recent Labs  Lab 03/01/23 1324 03/01/23 1515 03/01/23 1751 03/01/23 2110 03/02/23 0802  GLUCAP 142* 170* 162* 168* 147*     Recent Results (from the past 240 hour(s))  Resp panel by RT-PCR (RSV, Flu A&B, Covid) Anterior Nasal Swab     Status: None   Collection Time: 03/01/23  5:38 AM   Specimen: Anterior Nasal Swab  Result Value Ref Range Status   SARS Coronavirus 2 by RT PCR NEGATIVE NEGATIVE Final    Comment: (NOTE) SARS-CoV-2 target nucleic acids are NOT DETECTED.  The SARS-CoV-2 RNA is generally detectable in upper respiratory specimens during the acute phase of infection. The lowest concentration of SARS-CoV-2 viral copies this assay can detect is 138 copies/mL. A negative result does not preclude SARS-Cov-2 infection and should not be used as  the sole basis for treatment or other patient management decisions. A negative result may occur with  improper specimen collection/handling, submission of specimen other than nasopharyngeal swab, presence of viral mutation(s) within the areas targeted by this assay, and inadequate number of viral copies(<138 copies/mL). A negative result must be combined with clinical observations, patient history, and epidemiological information. The expected result is Negative.  Fact Sheet for Patients:  EntrepreneurPulse.com.au  Fact Sheet for Healthcare Providers:  IncredibleEmployment.be  This test is no t yet approved or cleared by the [place] FDA and  has been authorized for detection and/or diagnosis of SARS-CoV-2 by FDA under an Emergency Use Authorization (EUA). This EUA will remain  in effect (meaning this test can be used) for the duration of the COVID-19 declaration under Section 564(b)(1) of the Act, 21 U.S.C.section 360bbb-3(b)(1), unless the authorization is terminated  or revoked sooner.       Influenza A by PCR NEGATIVE NEGATIVE Final   Influenza B by PCR NEGATIVE NEGATIVE Final    Comment: (NOTE) The Xpert Xpress SARS-CoV-2/FLU/RSV plus assay is intended as an aid in the diagnosis of influenza from Nasopharyngeal swab specimens and should not be used as a sole basis for treatment. Nasal washings and aspirates are unacceptable for Xpert Xpress SARS-CoV-2/FLU/RSV testing.  Fact Sheet for Patients: EntrepreneurPulse.com.au  Fact Sheet for Healthcare Providers: IncredibleEmployment.be  This test is not yet approved or cleared by the [place] FDA and has been authorized for detection and/or diagnosis of SARS-CoV-2 by FDA under an Emergency Use Authorization (EUA). This EUA will remain in effect (meaning this test can be used) for the duration of the COVID-19 declaration under Section 564(b)(1) of  the Act, 21 U.S.C. section 360bbb-3(b)(1), unless the authorization is terminated or revoked.     Resp Syncytial Virus by PCR NEGATIVE NEGATIVE Final    Comment: (NOTE) Fact Sheet for Patients: EntrepreneurPulse.com.au  Fact Sheet for Healthcare Providers: IncredibleEmployment.be  This test is not yet approved or cleared by the [place] FDA and has been authorized for detection and/or diagnosis of SARS-CoV-2 by FDA under an Emergency Use Authorization (EUA). This EUA will remain in effect (meaning this test can be used) for the duration of the COVID-19 declaration under Section 564(b)(1) of the Act, 21 U.S.C. section 360bbb-3(b)(1), unless the authorization is terminated or revoked.  Performed at [facility], [place] [address] [place], [place] [address]   MRSA Next Gen by PCR, Nasal     Status: None   Collection Time: 03/01/23  1:01 PM   Specimen: Nasal Mucosa; Nasal Swab  Result Value Ref Range Status   MRSA by PCR Next Gen NOT  DETECTED NOT DETECTED Final    Comment: (NOTE) The GeneXpert MRSA Assay (FDA approved for NASAL specimens only), is one component of a comprehensive MRSA colonization surveillance program. It is not intended to diagnose MRSA infection nor to guide or monitor treatment for MRSA infections. Test performance is not FDA approved in patients less than 97 years old. Performed at [facility], [place] [address] [place], [place] [address]          Radiology Studies: DG Abd 2 Views  Result Date: 03/01/2023 CLINICAL DATA:  Vomiting and abdominal pain EXAM: ABDOMEN - 2 VIEW COMPARISON:  X-ray 11/03/2014.  CT 08/19/2022 FINDINGS: Gas is seen in nondilated loops of small and large bowel. Scattered stool. Few air-filled mildly distended loops of small bowel right upper quadrant. Gas and stool in the rectum. No obstruction. No obvious free air beneath the diaphragm on the upright view.  Degenerative changes of the spine. There are some presumed vascular calcifications along the pelvis. Overlapping cardiac leads. IMPRESSION: Nonspecific bowel gas pattern. Electronically Signed   By: Jill Side M.D.   On: 03/01/2023 14:47   DG Chest 2 View  Result Date: 03/01/2023 CLINICAL DATA:  Cough, flu like symptoms EXAM: CHEST - 2 VIEW COMPARISON:  Prior chest x-ray 12/14/2022 FINDINGS: Significant enlargement of the cardiopericardial silhouette appears greater than previously seen. Additionally, fine interstitial prominence is present throughout the lungs with mild pulmonary vascular congestion. No overt pulmonary edema. No pleural effusion or pneumothorax. No focal airspace infiltrate. IMPRESSION: 1. Progressive enlargement of the cardiopericardial silhouette concerning for increasing cardiomegaly. 2. Pulmonary vascular congestion with diffuse fine interstitial prominence. Differential considerations include early interstitial edema versus atypical/viral respiratory infection. Electronically Signed   By: Jacqulynn Cadet M.D.   On: 03/01/2023 06:27        Scheduled Meds:  amLODipine  5 mg Oral Daily   aspirin EC  81 mg Oral q morning   carvedilol  6.25 mg Oral BID WC   Chlorhexidine Gluconate Cloth  6 each Topical Daily   enoxaparin (LOVENOX) injection  40 mg Subcutaneous Q24H   fluticasone  2 spray Each Nare Daily   furosemide  40 mg Intravenous Q12H   guaiFENesin  1,200 mg Oral BID   hydrALAZINE  100 mg Oral Q8H   insulin aspart  0-9 Units Subcutaneous TID WC   loratadine  10 mg Oral Daily   losartan  100 mg Oral Daily   pantoprazole (PROTONIX) IV  40 mg Intravenous Daily   pravastatin  20 mg Oral Daily   sodium chloride flush  3 mL Intravenous Q12H   tamsulosin  0.8 mg Oral QHS   Continuous Infusions:  sodium chloride Stopped (03/01/23 1613)   cefTRIAXone (ROCEPHIN)  IV     nitroGLYCERIN 105 mcg/min (03/02/23 0819)   potassium chloride 10 mEq (03/02/23 0952)     LOS: 1  day    Time spent: 40 minutes    Irine Seal, MD [facility]   To contact the attending provider between 7A-7P or the covering provider during after hours 7P-7A, please log into the web site [email or web address] and access using universal [place] password for that web site. If you do not have the password, please call the hospital operator.  03/02/2023, 10:09 AM

## 2023-03-02 NOTE — Progress Notes (Signed)
IV potassium stopped by this RN d/t patient intolerance. IV K continued to cause excruciating burning sensation even after IVF run alongside K, and site rotated. Dr Grandville Silos notified by this RN, awaiting PO replacement.

## 2023-03-02 NOTE — Progress Notes (Signed)
OT Cancellation Note  Patient Details Name: [name] MRN: [id number] DOB: 12-26-1951   Cancelled Treatment:    Reason Eval/Treat Not Completed: Medical issues which prohibited therapy Nurse asking for therapy to hold off at this time with patient increasingly anxious and decreased following commands. OT to continue to follow and check back on 03/03/23.  [name], MS Acute Rehabilitation Department Office# [phone]  03/02/2023, 10:46 AM

## 2023-03-02 NOTE — Consult Note (Signed)
Urology Consult   Physician requesting consult: Irine Seal, MD   Reason for consult: Urinary retention  History of Present Illness: [name] is a 72 y.o. male currently being treated for acute on chronic heart failure, HTN emergency and possible UTI.  Urology was consulted after the patient was found to have a PVR >300 mL with increasing difficulty urinating and multiple unsuccessful attempts by the ICU staff to place a Foley catheter.  Currently followed by Dr. Emeline Gins and cysto in 2022 were unremarkable.  He was been on tamsulosin 0.8 mg while in the hospital.    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    Scheduled Meds:  amLODipine  5 mg Oral Daily   aspirin EC  81 mg Oral q morning   atorvastatin  40 mg Oral Daily   carvedilol  6.25 mg Oral BID WC   Chlorhexidine Gluconate Cloth  6 each Topical Daily   enoxaparin (LOVENOX) injection  40 mg Subcutaneous Q24H   fluticasone  2 spray Each Nare Daily   furosemide  40 mg Intravenous Q12H   guaiFENesin  1,200 mg Oral BID   hydrALAZINE  100 mg Oral Q8H   insulin aspart   0-9 Units Subcutaneous TID WC   loratadine  10 mg Oral Daily   losartan  100 mg Oral Daily   pantoprazole (PROTONIX) IV  40 mg Intravenous Daily   potassium chloride  40 mEq Oral Q4H   sodium chloride flush  3 mL Intravenous Q12H   tamsulosin  0.8 mg Oral QHS   Continuous Infusions:  sodium chloride Stopped (03/01/23 1613)   cefTRIAXone (ROCEPHIN)  IV Stopped (03/02/23 1220)   nitroGLYCERIN 45 mcg/min (03/02/23 1611)   PRN Meds:.sodium chloride, acetaminophen, diclofenac Sodium, dicyclomine, hydrOXYzine, loperamide, methocarbamol, morphine injection, naproxen, nicotine, ondansetron (ZOFRAN) IV, ondansetron, mouth rinse, sodium chloride flush  Allergies: No Known Allergies  Family History  Problem Relation Age of Onset   CAD Father        "heart attack in his 48's"   Hypertension Father    Diabetes Brother    Diabetes Sister    Hypertension Mother     Social History:  reports that he has been smoking cigarettes. He has been smoking an average of .5 packs per day. He uses smokeless tobacco. He reports current alcohol use. He reports that he does not currently use drugs after having used the following drugs: Marijuana, Cocaine, and IV.  ROS: A complete review of systems was performed.  All systems are negative except for pertinent  findings as noted.  Physical Exam:  Vital signs in last 24 hours: Temp:  [98.1 F (36.7 C)-99.9 F (37.7 C)] 98.1 F (36.7 C) (03/04 1209) Pulse Rate:  [86-141] 87 (03/04 1600) Resp:  [9-43] 25 (03/04 1600) BP: (102-244)/(29-158) 169/52 (03/04 1600) SpO2:  [90 %-99 %] 95 % (03/04 1600) Weight:  [68.1 kg] 68.1 kg (03/04 0144) Constitutional:  Alert and oriented, No acute distress Cardiovascular: Regular rate and rhythm, No JVD Respiratory: Normal respiratory effort, Lungs clear bilaterally GI: Abdomen is soft, nontender, nondistended, no abdominal masses GU: Penis is uncircumcised.  No CVA tenderness Lymphatic: No lymphadenopathy Neurologic:  Grossly intact, no focal deficits Psychiatric: Normal mood and affect  Laboratory Data:  Recent Labs    03/01/23 0657 03/02/23 0801  WBC 9.5 13.6*  HGB 12.9* 11.8*  HCT 42.0 37.7*  PLT 263 293    Recent Labs    03/01/23 0657 03/02/23 0801  NA 140 141  K 3.2* 2.8*  CL 110 107  GLUCOSE 108* 139*  BUN 19 22  CALCIUM 8.9 9.0  CREATININE 0.80 0.99     Results for orders placed or performed during the hospital encounter of 03/01/23 (from the past 24 hour(s))  Glucose, capillary     Status: Abnormal   Collection Time: 03/01/23  5:51 PM  Result Value Ref Range   Glucose-Capillary 162 (H) 70 - 99 mg/dL   Comment 1 Notify RN   Troponin I (High Sensitivity)     Status: Abnormal   Collection Time: 03/01/23  6:27 PM  Result Value Ref Range   Troponin I (High Sensitivity) 292 (HH) <18 ng/L  Rapid urine drug screen (hospital performed)     Status: None   Collection Time: 03/01/23  6:54 PM  Result Value Ref Range   Opiates NONE DETECTED NONE DETECTED   Cocaine NONE DETECTED NONE DETECTED   Benzodiazepines NONE DETECTED NONE DETECTED   Amphetamines NONE DETECTED NONE DETECTED   Tetrahydrocannabinol NONE DETECTED NONE DETECTED   Barbiturates NONE DETECTED NONE DETECTED  Glucose, capillary     Status: Abnormal   Collection Time: 03/01/23  9:10 PM  Result Value Ref Range   Glucose-Capillary 168 (H) 70 - 99 mg/dL  Basic metabolic panel     Status: Abnormal   Collection Time: 03/02/23  8:01 AM  Result Value Ref Range   Sodium 141 135 - 145 mmol/L   Potassium 2.8 (L) 3.5 - 5.1 mmol/L   Chloride 107 98 - 111 mmol/L   CO2 22 22 - 32 mmol/L   Glucose, Bld 139 (H) 70 - 99 mg/dL   BUN 22 8 - 23 mg/dL   Creatinine, Ser 0.99 0.61 - 1.24 mg/dL   Calcium 9.0 8.9 - 10.3 mg/dL   GFR, Estimated >60 >60 mL/min   Anion gap 12 5 - 15  Brain natriuretic peptide     Status: Abnormal   Collection Time: 03/02/23  8:01 AM  Result Value Ref Range   B Natriuretic Peptide 764.5 (H) 0.0 - 100.0  pg/mL  Magnesium     Status: None   Collection Time: 03/02/23  8:01 AM  Result Value Ref Range   Magnesium 2.2 1.7 - 2.4 mg/dL  Lipid panel     Status: Abnormal   Collection Time: 03/02/23  8:01 AM  Result Value Ref Range   Cholesterol 207 (H) 0 - 200 mg/dL   Triglycerides 98 <150 mg/dL   HDL 35 (L) >40 mg/dL   Total CHOL/HDL Ratio 5.9 RATIO  VLDL 20 0 - 40 mg/dL   LDL Cholesterol 152 (H) 0 - 99 mg/dL  Troponin I (High Sensitivity)     Status: Abnormal   Collection Time: 03/02/23  8:01 AM  Result Value Ref Range   Troponin I (High Sensitivity) 191 (HH) <18 ng/L  CBC with Differential/Platelet     Status: Abnormal   Collection Time: 03/02/23  8:01 AM  Result Value Ref Range   WBC 13.6 (H) 4.0 - 10.5 K/uL   RBC 5.29 4.22 - 5.81 MIL/uL   Hemoglobin 11.8 (L) 13.0 - 17.0 g/dL   HCT 37.7 (L) 39.0 - 52.0 %   MCV 71.3 (L) 80.0 - 100.0 fL   MCH 22.3 (L) 26.0 - 34.0 pg   MCHC 31.3 30.0 - 36.0 g/dL   RDW 15.1 11.5 - 15.5 %   Platelets 293 150 - 400 K/uL   nRBC 0.0 0.0 - 0.2 %   Neutrophils Relative % 78 %   Neutro Abs 10.5 (H) 1.7 - 7.7 K/uL   Lymphocytes Relative 15 %   Lymphs Abs 2.1 0.7 - 4.0 K/uL   Monocytes Relative 7 %   Monocytes Absolute 1.0 0.1 - 1.0 K/uL   Eosinophils Relative 0 %   Eosinophils Absolute 0.0 0.0 - 0.5 K/uL   Basophils Relative 0 %   Basophils Absolute 0.0 0.0 - 0.1 K/uL   Immature Granulocytes 0 %   Abs Immature Granulocytes 0.06 0.00 - 0.07 K/uL  Glucose, capillary     Status: Abnormal   Collection Time: 03/02/23  8:02 AM  Result Value Ref Range   Glucose-Capillary 147 (H) 70 - 99 mg/dL   Comment 1 Notify RN    Comment 2 Document in Chart   Glucose, capillary     Status: Abnormal   Collection Time: 03/02/23 11:39 AM  Result Value Ref Range   Glucose-Capillary 169 (H) 70 - 99 mg/dL   Comment 1 Notify RN    Comment 2 Document in Chart    Recent Results (from the past 240 hour(s))  Resp panel by RT-PCR (RSV, Flu A&B, Covid) Anterior Nasal Swab      Status: None   Collection Time: 03/01/23  5:38 AM   Specimen: Anterior Nasal Swab  Result Value Ref Range Status   SARS Coronavirus 2 by RT PCR NEGATIVE NEGATIVE Final    Comment: (NOTE) SARS-CoV-2 target nucleic acids are NOT DETECTED.  The SARS-CoV-2 RNA is generally detectable in upper respiratory specimens during the acute phase of infection. The lowest concentration of SARS-CoV-2 viral copies this assay can detect is 138 copies/mL. A negative result does not preclude SARS-Cov-2 infection and should not be used as the sole basis for treatment or other patient management decisions. A negative result may occur with  improper specimen collection/handling, submission of specimen other than nasopharyngeal swab, presence of viral mutation(s) within the areas targeted by this assay, and inadequate number of viral copies(<138 copies/mL). A negative result must be combined with clinical observations, patient history, and epidemiological information. The expected result is Negative.  Fact Sheet for Patients:  EntrepreneurPulse.com.au  Fact Sheet for Healthcare Providers:  IncredibleEmployment.be  This test is no t yet approved or cleared by the [place] FDA and  has been authorized for detection and/or diagnosis of SARS-CoV-2 by FDA under an Emergency Use Authorization (EUA). This EUA will remain  in effect (meaning this test can be used) for the duration of the COVID-19 declaration under Section 564(b)(1) of the Act, 21 U.S.C.section 360bbb-3(b)(1),  unless the authorization is terminated  or revoked sooner.       Influenza A by PCR NEGATIVE NEGATIVE Final   Influenza B by PCR NEGATIVE NEGATIVE Final    Comment: (NOTE) The Xpert Xpress SARS-CoV-2/FLU/RSV plus assay is intended as an aid in the diagnosis of influenza from Nasopharyngeal swab specimens and should not be used as a sole basis for treatment. Nasal washings and aspirates are  unacceptable for Xpert Xpress SARS-CoV-2/FLU/RSV testing.  Fact Sheet for Patients: EntrepreneurPulse.com.au  Fact Sheet for Healthcare Providers: IncredibleEmployment.be  This test is not yet approved or cleared by the [place] FDA and has been authorized for detection and/or diagnosis of SARS-CoV-2 by FDA under an Emergency Use Authorization (EUA). This EUA will remain in effect (meaning this test can be used) for the duration of the COVID-19 declaration under Section 564(b)(1) of the Act, 21 U.S.C. section 360bbb-3(b)(1), unless the authorization is terminated or revoked.     Resp Syncytial Virus by PCR NEGATIVE NEGATIVE Final    Comment: (NOTE) Fact Sheet for Patients: EntrepreneurPulse.com.au  Fact Sheet for Healthcare Providers: IncredibleEmployment.be  This test is not yet approved or cleared by the [place] FDA and has been authorized for detection and/or diagnosis of SARS-CoV-2 by FDA under an Emergency Use Authorization (EUA). This EUA will remain in effect (meaning this test can be used) for the duration of the COVID-19 declaration under Section 564(b)(1) of the Act, 21 U.S.C. section 360bbb-3(b)(1), unless the authorization is terminated or revoked.  Performed at [facility], [place] [address], [place], [place] [address]   Urine Culture     Status: Abnormal (Preliminary result)   Collection Time: 03/01/23 11:54 AM   Specimen: Urine, Random  Result Value Ref Range Status   Specimen Description   Final    URINE, RANDOM Performed at [facility], [place] [address] [place], [place] [address]    Special Requests   Final    NONE Performed at [facility], [place] [address], [place], [place] [address]    Culture (A)  Final    >=100,000 COLONIES/mL ESCHERICHIA COLI SUSCEPTIBILITIES TO FOLLOW Performed at [facility], [place] [address] [place], [place] [address]    Report Status PENDING  Incomplete  Respiratory (~20 pathogens) panel by PCR     Status: None   Collection Time: 03/01/23  1:01 PM   Specimen: Nasopharyngeal Swab; Respiratory  Result Value Ref Range Status   Adenovirus NOT DETECTED NOT DETECTED Final   Coronavirus 229E NOT DETECTED NOT DETECTED Final    Comment: (NOTE) The Coronavirus on the Respiratory Panel, DOES NOT test for the novel  Coronavirus (2019 nCoV)    Coronavirus HKU1 NOT DETECTED NOT DETECTED Final   Coronavirus NL63 NOT DETECTED NOT DETECTED Final   Coronavirus OC43 NOT DETECTED NOT DETECTED Final   Metapneumovirus NOT DETECTED NOT DETECTED Final   Rhinovirus / Enterovirus NOT DETECTED NOT DETECTED Final   Influenza A NOT DETECTED NOT DETECTED Final   Influenza B NOT DETECTED NOT DETECTED Final   Parainfluenza Virus 1 NOT DETECTED NOT DETECTED Final   Parainfluenza Virus 2 NOT DETECTED NOT DETECTED Final   Parainfluenza Virus 3 NOT DETECTED NOT DETECTED Final   Parainfluenza Virus 4 NOT DETECTED NOT DETECTED Final   Respiratory Syncytial Virus NOT DETECTED NOT DETECTED Final   Bordetella pertussis NOT DETECTED NOT DETECTED Final   Bordetella Parapertussis NOT DETECTED NOT DETECTED Final   Chlamydophila pneumoniae NOT DETECTED NOT  DETECTED Final   Mycoplasma pneumoniae NOT DETECTED NOT DETECTED Final    Comment: Performed at [facility], [place] [address], [place], [place] [address]  MRSA Next Gen by PCR, Nasal     Status: None   Collection Time: 03/01/23  1:01 PM   Specimen: Nasal Mucosa; Nasal Swab  Result Value Ref Range Status   MRSA by PCR Next Gen NOT DETECTED NOT DETECTED Final    Comment: (NOTE) The GeneXpert MRSA Assay (FDA approved for NASAL specimens only), is one component of a comprehensive MRSA colonization surveillance program. It is not intended to diagnose MRSA infection nor to guide or monitor treatment for MRSA infections. Test performance is not FDA  approved in patients less than 71 years old. Performed at [facility], [place] [address], [place], [place] [address]     Renal Function: Recent Labs    03/01/23 [id number] 03/02/23 0801  CREATININE 0.80 0.99   Estimated Creatinine Clearance: 66.9 mL/min (by C-G formula based on SCr of 0.99 mg/dL).  Radiologic Imaging: DG Abd 2 Views  Result Date: 03/01/2023 CLINICAL DATA:  Vomiting and abdominal pain EXAM: ABDOMEN - 2 VIEW COMPARISON:  X-ray 11/03/2014.  CT 08/19/2022 FINDINGS: Gas is seen in nondilated loops of small and large bowel. Scattered stool. Few air-filled mildly distended loops of small bowel right upper quadrant. Gas and stool in the rectum. No obstruction. No obvious free air beneath the diaphragm on the upright view. Degenerative changes of the spine. There are some presumed vascular calcifications along the pelvis. Overlapping cardiac leads. IMPRESSION: Nonspecific bowel gas pattern. Electronically Signed   By: Jill Side M.D.   On: 03/01/2023 14:47   DG Chest 2 View  Result Date: 03/01/2023 CLINICAL DATA:  Cough, flu like symptoms EXAM: CHEST - 2 VIEW COMPARISON:  Prior chest x-ray 12/14/2022 FINDINGS: Significant enlargement of the cardiopericardial silhouette appears greater than previously seen. Additionally, fine interstitial prominence is present throughout the lungs with mild pulmonary vascular congestion. No overt pulmonary edema. No pleural effusion or pneumothorax. No focal airspace infiltrate. IMPRESSION: 1. Progressive enlargement of the cardiopericardial silhouette concerning for increasing cardiomegaly. 2. Pulmonary vascular congestion with diffuse fine interstitial prominence. Differential considerations include early interstitial edema versus atypical/viral respiratory infection. Electronically Signed   By: Jacqulynn Cadet M.D.   On: 03/01/2023 06:27    I independently reviewed the above imaging studies.  Procedure:  Simple Foley  catheterization  The patient's genitals were prepped and draped in the usual fashion.  A 14 French Foley was easily placed with minimal resistance and with return of clear-yellow urine.  The catheter was then placed to gravity drainage.  The patient tolerated the procedure well.   Impression/Recommendation 72 year old male with a history of BPH with LUTS.  Currently being treated for acute on chronic CHF and HTN emergency.  Primary team concerned about urinary retention  -14 French Foley catheter was easily placed with return of clear-yellow urine.  Continue tamsulosin 0.8 mg.  Catheter removal per primary team.  Call back PRN.   Ellison Hughs, MD [facility] 03/02/2023, 4:19 PM

## 2023-03-02 NOTE — Consult Note (Addendum)
Cardiology Consultation   Patient ID: [name] MRN: [id number]; DOB: 1951-07-31  Admit date: 03/01/2023 Date of Consult: 03/02/2023  PCP:  Samuel Ebbs, MD   [facility] Providers Cardiologist:  None     Patient Profile:   [name] is a 72 y.o. male with a hx of HOCM, chronic diastolic heart failure, history of alcohol dependence, history of brain tumor, chronic back pain, chronic headaches, history of CVA, type 2 DM, HTN, history of narcotic abuse, ongoing tobacco use who is being seen 03/02/2023 for the evaluation of acute on chronic diastolic heart failure, HTN emergency at the request of Dr. Grandville Silos.  History of Present Illness:   [name] is a 72 year old male with above medical history. Per chart review, patient has been seen by [facility] during multiple admissions in the past, but does not follow with cardiology as an outpatient. Patient was first seen by cardiology in 11/2013 for evaluation of chest pain. Chest pain was atypical, and it was recommended patient undergo outpatient stress test, which never occurred. He was later seen in 10/2016 for evaluation of chest pain in the setting of urosepsis. Echocardiogram on 11/03/16 showed EF [id number], grade 1 diastolic dysfunction, moderate aortic regurgitation.  Troponins were mildly elevated, there was plan for nuclear stress test once urosepsis had resolved.  However, nuclear stress test was never completed.  Patient was most recently seen by cardiology in 01/2020 for evaluation of possible hypertrophic obstructive cardiomyopathy.  Patient had presented to the hospital complaining of nausea, vomiting, gastric pain and squeezing-like sensation.  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  Urine drug screen was positive for cocaine.  Echocardiogram on 02/17/2020 showed hyperdynamic LV with LVOT obstruction.  There was chordal SAM with minimal anterior  mitral valve SAM.  There was no characteristic posteriorly directed MR as expected with hokum.  Recommended cardiac MRI to help distinguish pathology.  Given his cocaine use, he was not started on beta-blocker at that time.  Instead was started on diltiazem.  It does not appear that cardiac MRI was ever obtained.  Patient presented to the ED on 3/3 complaining of flulike symptoms.  In the ED, patient complained of diffuse abdominal pain, some dysuria, rhinorrhea, nasal congestion, productive cough, shortness of breath.  BP elevated in the ED, got as high as 248/58.  Labs in the ED showed BNP elevated to 894.6.  High sensitive troponin 39>326>292.  Na 140, K3.2, creatinine 0.80, WBC 9.5, hemoglobin 12.9, platelets 263.  COVID, flu, RSV negative.  Urine drug screen negative.  Urinalysis concerning for possible UTI.  Chest x-ray showed progressive enlargement of the cardio pericardial silhouette concerning for increasing cardiomegaly, pulmonary vascular congestion with diffuse fine interstitial prominence that could represent early interstitial edema versus atypical/viral respiratory infection.   Patient is uncooperative with interview.  He is asleep, opens his eyes to verbal stimulation.  Only answers yes/no questions.  Discussed with RN, patient has been uncooperative with interview since yesterday evening.  Yesterday evening, patient would not answer our questions until his family asked him to speak.  He also received Ativan at approximately 4 AM today and is fairly tired.  Family at bedside reports that patient came to the hospital for nasal congestion, shortness of breath.  Reported that his breathing had been bad on and off for a while now.  Patient has not been complaining of chest pain, abdominal distention, or ankle edema.  This  morning, patient denies having chest pain.  He is laying flat in the bed, denies shortness of breath.  Of note, patient does have a history of bladder obstruction.  Past  Medical History:  Diagnosis Date   Alcohol dependence ([place]) 12/10/2013   Angioedema of lips 12/24/2013   Brain tumor ([place])    CHF (congestive heart failure) ([facility])    diastolic   Chronic back pain    Chronic headache    CVA (cerebral vascular accident) ([place])    Diabetes mellitus    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[place]) 02/18/2020   HTN (hypertension), malignant 09/06/2012   Hypertension    Narcotic abuse ([place])    Normal cardiac stress test 2009   TIA (transient ischemic attack) 09/06/2012   Tobacco use disorder 12/10/2013   UTI (urinary tract infection)    Vertigo 09/06/2012    Past Surgical History:  Procedure Laterality Date   NO PAST SURGERIES       Home Medications:  Prior to Admission medications   Medication Sig Start Date End Date Taking? Authorizing Provider  albuterol (PROVENTIL HFA;VENTOLIN HFA) 108 (90 Base) MCG/ACT inhaler Inhale 1-2 puffs into the lungs every 6 (six) hours as needed for wheezing. 09/04/16  Yes Tanna Furry, MD  amLODipine (NORVASC) 10 MG tablet Take 1 tablet (10 mg total) by mouth daily. 03/25/22   Regalado, Jerald Kief A, MD  aspirin EC 81 MG tablet Take 81 mg by mouth every morning.    [provider]  cephALEXin (KEFLEX) 500 MG capsule Take 1 capsule (500 mg total) by mouth 4 (four) times daily. Patient not taking: Reported on 03/01/2023 08/19/22   Pattricia Boss, MD  diclofenac Sodium (VOLTAREN) 1 % GEL Apply 1 application. topically 4 (four) times daily as needed (pain). 12/27/21   [provider]  diltiazem (CARDIZEM CD) 120 MG 24 hr capsule Take 1 capsule (120 mg total) by mouth daily. 03/25/22 03/25/23  Regalado, Belkys A, MD  doxazosin (CARDURA) 4 MG tablet Take 1 tablet (4 mg total) by mouth daily. 03/25/22   Regalado, Belkys A, MD  hydrALAZINE (APRESOLINE) 100 MG tablet Take 1 tablet (100 mg total) by mouth every 8 (eight) hours. 03/25/22   Regalado, Belkys A, MD  losartan (COZAAR) 100 MG tablet Take 1 tablet (100 mg total) by  mouth daily. 03/25/22   Regalado, Belkys A, MD  losartan-hydrochlorothiazide (HYZAAR) 100-12.5 MG tablet Take 1 tablet by mouth daily.    [provider]  metFORMIN (GLUCOPHAGE) 500 MG tablet Take 1 tablet by mouth twice a day Patient taking differently: Take 500 mg by mouth 2 (two) times daily. 06/04/21     nicotine (NICODERM CQ - DOSED IN MG/24 HOURS) 21 mg/24hr patch Place 1 patch (21 mg total) onto the skin daily. Patient taking differently: Place 21 mg onto the skin daily as needed (smoking cessation). 02/21/20   Eugenie Filler, MD  omeprazole (PRILOSEC) 20 MG capsule Take 1 capsule (20 mg total) by mouth daily. 12/14/22   Charlesetta Shanks, MD  ondansetron (ZOFRAN-ODT) 8 MG disintegrating tablet Take 1 tablet (8 mg total) by mouth every 8 (eight) hours as needed for nausea or vomiting. Patient not taking: Reported on 03/01/2023 08/19/22   Pattricia Boss, MD  pantoprazole (PROTONIX) 40 MG tablet Take 1 tablet (40 mg total) by mouth daily. Patient not taking: Reported on 03/23/2022 02/20/20   Eugenie Filler, MD  pravastatin (PRAVACHOL) 20 MG tablet Take 20 mg by mouth in the morning. 02/18/22   [provider]  sucralfate (CARAFATE) 1 GM/10ML suspension Take 10 mLs (1 g total) by mouth 4 (four) times daily -  with meals and at bedtime. Patient not taking: Reported on 03/01/2023 12/14/22   Charlesetta Shanks, MD  tamsulosin (FLOMAX) 0.4 MG CAPS capsule TAKE TWO CAPSULES BY MOUTH AT BEDTIME 03/25/22   Regalado, Belkys A, MD  traMADol (ULTRAM) 50 MG tablet Take 1 tablet by mouth twice a day as needed for severe pains Patient not taking: Reported on 03/01/2023 06/04/21       Inpatient Medications: Scheduled Meds:  aspirin EC  81 mg Oral q morning   Chlorhexidine Gluconate Cloth  6 each Topical Daily   diltiazem  120 mg Oral Daily   enoxaparin (LOVENOX) injection  40 mg Subcutaneous Q24H   fluticasone  2 spray Each Nare Daily   furosemide  40 mg Intravenous Q12H   guaiFENesin  1,200 mg  Oral BID   hydrALAZINE  100 mg Oral Q8H   insulin aspart  0-9 Units Subcutaneous TID WC   loratadine  10 mg Oral Daily   losartan  100 mg Oral Daily   pantoprazole (PROTONIX) IV  40 mg Intravenous Daily   pravastatin  20 mg Oral Daily   sodium chloride flush  3 mL Intravenous Q12H   tamsulosin  0.8 mg Oral QHS   Continuous Infusions:  sodium chloride Stopped (03/01/23 1613)   cefTRIAXone (ROCEPHIN)  IV     nitroGLYCERIN 100 mcg/min (03/02/23 0702)   PRN Meds: sodium chloride, acetaminophen, diclofenac Sodium, hydrALAZINE, LORazepam, morphine injection, nicotine, mouth rinse, sodium chloride flush  Allergies:   No Known Allergies  Social History:   Social History   Socioeconomic History   Marital status: Legally Separated    Spouse name: Not on file   Number of children: Not on file   Years of education: Not on file   Highest education level: Not on file  Occupational History   Not on file  Tobacco Use   Smoking status: Every Day    Packs/day: 0.50    Types: Cigarettes   Smokeless tobacco: Current  Vaping Use   Vaping Use: Never used  Substance and Sexual Activity   Alcohol use: Yes   Drug use: Not Currently    Types: Marijuana, Cocaine, IV    Comment: Heroin - one month ago   Sexual activity: Not on file  Other Topics Concern   Not on file  Social History Narrative   Single.  Lives alone.     Social Determinants of Health   Financial Resource Strain: Not on file  Food Insecurity: Not on file  Transportation Needs: Not on file  Physical Activity: Not on file  Stress: Not on file  Social Connections: Not on file  Intimate Partner Violence: Not on file    Family History:    Family History  Problem Relation Age of Onset   CAD Father        "heart attack in his 28's"   Hypertension Father    Diabetes Brother    Diabetes Sister    Hypertension Mother      ROS:  Please see the history of present illness.   All other ROS reviewed and negative.      Physical Exam/Data:   Vitals:   03/02/23 0724 03/02/23 0727 03/02/23 0730 03/02/23 0733  BP: (!) 179/55 (!) 169/55 (!) 187/59 (!) 197/59  Pulse: (!) 108 (!) 106 (!) 106 (!) 105  Resp: (!) 28 (!) 27 (!) 26 (!)  29  Temp:      TempSrc:      SpO2: 95% 96% 95% 95%  Weight:      Height:        Intake/Output Summary (Last 24 hours) at 03/02/2023 0802 Last data filed at 03/02/2023 [id number] Gross per 24 hour  Intake 1171.77 ml  Output 725 ml  Net 446.77 ml      03/02/2023    1:44 AM 03/01/2023    3:15 PM 12/14/2022    5:41 AM  Last 3 Weights  Weight (lbs) 150 lb 2.1 oz 148 lb 9.4 oz 165 lb  Weight (kg) 68.1 kg 67.4 kg 74.844 kg     Body mass index is 22.17 kg/m.  General: Elderly male, laying flat in the bed.  No acute distress, wearing [place] HEENT: normal Neck: no JVD Vascular: No carotid bruits.  Systolic murmur radiates to carotids bilaterally.  Radial pulses 2+ bilaterally Cardiac: Regular rhythm, tachycardic.  Grade 4/6 systolic murmur throughout with radiation to carotids Lungs: Crackles in bilateral lung bases, otherwise clear to auscultation.  Normal work of breathing on 2 L supplemental oxygen via nasal cannula Abd: soft, mildly tender to palpation lower quadrants. Ext: no edema in bilateral lower extremities Musculoskeletal:  No deformities, BUE and BLE strength normal and equal Skin: warm and dry  Neuro:  CNs 2-12 intact, no focal abnormalities noted Psych:  Normal affect   EKG:  The EKG was personally reviewed and demonstrates:  Sinus tachycardia, heart rate 121 bpm, LVH Telemetry:  Telemetry was personally reviewed and demonstrates: Sinus tachycardia, heart rate 100-110  Relevant CV Studies:  Echocardiogram 02/17/20  1. The LV is hyperdynamic with LVOT obstruction. Vmax 3.3 m/s and peak  gradient 43 mmHG. There appears to be chordal SAM with minimal anterior  mitral valve SAM. There is no characteristic posteriorly directed MR as  expected with HOCM. There is mild LVH   that is not asymmetric. Overall, this could represent obstruction at the  level of the chordal apparatus due to hyperdynamic LV/small LV vs  hypertrophic obstructive cardiomyopathy. A cardiac MRI would be helpful to  distinguish the pathology. Left  ventricular ejection fraction, by estimation, is >75%. The left ventricle  has hyperdynamic function. The left ventricle has no regional wall motion  abnormalities. There is mild concentric left ventricular hypertrophy. Left  ventricular diastolic  parameters are consistent with Grade I diastolic dysfunction (impaired  relaxation).   2. Right ventricular systolic function is normal. The right ventricular  size is normal. Tricuspid regurgitation signal is inadequate for assessing  PA pressure.   3. Left atrial size was mildly dilated.   4. The mitral valve is grossly normal. Trivial mitral valve  regurgitation.   5. The aortic valve is tricuspid. Aortic valve regurgitation is mild. No  aortic stenosis is present.   6. The inferior vena cava is normal in size with greater than 50%  respiratory variability, suggesting right atrial pressure of 3 mmHg   Laboratory Data:  High Sensitivity Troponin:   Recent Labs  Lab 03/01/23 1233 03/01/23 1345 03/01/23 1827  TROPONINIHS 39* 326* 292*     Chemistry Recent Labs  Lab 03/01/23 0657 03/01/23 1233  NA 140  --   K 3.2*  --   CL 110  --   CO2 23  --   GLUCOSE 108*  --   BUN 19  --   CREATININE 0.80  --   CALCIUM 8.9  --   MG  --  1.5*  GFRNONAA >60  --   ANIONGAP 7  --     Recent Labs  Lab 03/01/23 0657  PROT 7.9  ALBUMIN 3.7  AST 20  ALT 13  ALKPHOS 60  BILITOT 0.9   Lipids No results for input(s): "CHOL", "TRIG", "HDL", "LABVLDL", "LDLCALC", "CHOLHDL" in the last 168 hours.  Hematology Recent Labs  Lab 03/01/23 0657  WBC 9.5  RBC 5.82*  HGB 12.9*  HCT 42.0  MCV 72.2*  MCH 22.2*  MCHC 30.7  RDW 15.6*  PLT 263   Thyroid No results for input(s): "TSH", "FREET4"  in the last 168 hours.  BNP Recent Labs  Lab 03/01/23 0657  BNP 894.6*    DDimer No results for input(s): "DDIMER" in the last 168 hours.   Radiology/Studies:  DG Abd 2 Views  Result Date: 03/01/2023 CLINICAL DATA:  Vomiting and abdominal pain EXAM: ABDOMEN - 2 VIEW COMPARISON:  X-ray 11/03/2014.  CT 08/19/2022 FINDINGS: Gas is seen in nondilated loops of small and large bowel. Scattered stool. Few air-filled mildly distended loops of small bowel right upper quadrant. Gas and stool in the rectum. No obstruction. No obvious free air beneath the diaphragm on the upright view. Degenerative changes of the spine. There are some presumed vascular calcifications along the pelvis. Overlapping cardiac leads. IMPRESSION: Nonspecific bowel gas pattern. Electronically Signed   By: Jill Side M.D.   On: 03/01/2023 14:47   DG Chest 2 View  Result Date: 03/01/2023 CLINICAL DATA:  Cough, flu like symptoms EXAM: CHEST - 2 VIEW COMPARISON:  Prior chest x-ray 12/14/2022 FINDINGS: Significant enlargement of the cardiopericardial silhouette appears greater than previously seen. Additionally, fine interstitial prominence is present throughout the lungs with mild pulmonary vascular congestion. No overt pulmonary edema. No pleural effusion or pneumothorax. No focal airspace infiltrate. IMPRESSION: 1. Progressive enlargement of the cardiopericardial silhouette concerning for increasing cardiomegaly. 2. Pulmonary vascular congestion with diffuse fine interstitial prominence. Differential considerations include early interstitial edema versus atypical/viral respiratory infection. Electronically Signed   By: Jacqulynn Cadet M.D.   On: 03/01/2023 06:27     Assessment and Plan:   Acute on chronic diastolic heart failure Possible history of HOCM  - Most recent echocardiogram from 02/17/2020 showed hyperdynamic LV with LVOT obstruction, chordal SAM with minimal anterior mitral valve SAM, mild LVH that is not asymmetric.   Findings were concerning for hokum, recommended cardiac MRI for clarification.  However, cardiac MRI was not completed. Pending echo, may need cardiac MRI this admission as patient has failed to follow up with cardiology multiple times in the past  - In the past, patient has not been on beta-blocker due to history of cocaine use.  Now, urine drug screen is clean. - Patient presents complaining of 2 days of upper respiratory-like symptoms including nasal congestion, cough, shortness of breath.  COVID, flu, RSV negative.  BNP elevated to 894.6.  Chest x-ray with increased cardiopericardial silhouette concerning for increasing cardiomegaly, pulmonary vascular congestion concerning for early interstitial edema versus atypical/viral respiratory infection  - Patient has been started on IV Lasix 40 mg twice daily. Patient does not appear significantly volume overloaded on exam this AM but continues to require supplemental oxygen. Suspect we will be able to transition to PO lasix tomorrow  - Strict I/os, daily weights, daily BMPs to assess renal function and electrolytes on diuretics.  - K supplementation has been ordered per primary  -  Low threshold for bladder scan given history of bladder obstruction if patient does not have  good urine output/complains of abdominal pain or distention  - Echocardiogram pending this admission- per RN, patient refused echocardiogram yesterday  - Urine drug screens have been negative for cocaine use since 03/2021.  Stop diltiazem and start carvedilol 6.25 mg BID. No signs of shock   - no SGLT2i given history of frequent UTIs    Hypertensive emergency - Patient was significantly hypertensive in the ED with blood pressure getting as high as 248/58 - Now, patient is on IV nitroglycerin, losartan 100 mg daily, hydralazine 100 mg 3 times daily, diltiazem 120 mg daily.  BP remains elevated, most recently 177/58 - As above, stop diltiazem 120 mg daily and start carvedilol 6.25 mg BID  and amlodipine 5 mg daily. Titrate as needed  - Wean IV nitro as tolerated.   Elevated troponins -High sensitive troponin [phone] - Suspect this is demand ischemia/type 2 MI in the setting of hypertensive emergency. Patient denies chest pain  - Echocardiogram pending. No need for heparin   Otherwise per primary  - Possible UTI - Hypokalemia/hypomagnesemia -- supplemented  - History of CVA  - Type 2 DM   Risk Assessment/Risk Scores:  New York Heart Association (NYHA) Functional Class NYHA Class II For questions or updates, please contact [place] Please consult [email or web address] for contact info under  Signed, Margie Billet, PA-C  03/02/2023 8:02 AM  Patient seen and examined, note reviewed with the signed Advanced Practice Provider. I personally reviewed laboratory data, imaging studies and relevant notes. I independently examined the patient and formulated the important aspects of the plan. I have personally discussed the plan with the patient and/or family. Comments or changes to the note/plan are indicated below.   Berniece Salines DO, MS [facility] Attending [place]  [address] [place], [place] [address] [phone] Website: [email or web address]  Acute on chronic diastolic heart failure  History of suspected Hypertrophy cardiomyopathy Hypertensive emergency Elevated troponin History of CVA Type 2 diabetes  Patient seen examined his bedside.  Unfortunately he would not speak to me-Per his bedside nurse he only wants mostly to his name when prompted by his family.  Unable to assess his baseline communication.  Per chart review his echo in 2021 was highly suspicious of hypertrophic cardiomyopathy based on his LVOT gradient as well as chordal SAM.  He did not follow-up for his cardiac MRI.  His initial presentation was due to hypertensive emergency with systolic being significantly high up to 248 mmHg on presentation.   He is currently multiple antihypertensives hydralazine 100 mg every 8, losartan 100 mg daily, Coreg was started this morning with Cardizem being stopped and IV diuretics Lasix 40 mg every 12.  Continue the amlodipine 5 mg for now.  He has had no significant improvement to this current regimen.  I would suggest increasing the carvedilol to 12.5 mg twice daily.  If he truly does have hypertrophic cardiomyopathy we would have to take him ARB and cut back on the diuretics.  And verapamil could be considered but we will wait for his repeat echocardiogram.  He ideally needs cardiac MRI.  Cut back on the Lasix to once daily for now.  Replete electrolyte keep mag above 2 and potassium above 4.  Troponin elevated in the setting of hypertensive heart disease with hypertensive emergency will continue to monitor no angina symptoms reported.  Diabetes type 2 per the primary team.  Thank you for consultation we will continue to follow with you.

## 2023-03-02 NOTE — Progress Notes (Signed)
SLP Cancellation Note  Patient Details Name: [name] MRN: [id number] DOB: 1951/03/12   Cancelled treatment:       Reason Eval/Treat Not Completed: Patient's level of consciousness. Per RN, patient has been refusing to answer questions, refusing interventions and currently some sedation from  Haldol. SLP will return later this date.   Sonia Baller, MA, CCC-SLP Speech Therapy

## 2023-03-02 NOTE — TOC Initial Note (Signed)
Transition of Care [facility]) - Initial/Assessment Note    Patient Details  Name: [name] MRN: [id number] Date of Birth: November 06, 1951  Transition of [facility]) CM/SW Contact:    Roseanne Kaufman, RN Phone Number: 03/02/2023, 6:31 PM  Clinical Narrative:     This RNCM received TOC consult for CHF screening. Per chart review patient qualifies for CHF protocol.  This RNCM attempted to speak with patient at bedside however patient sleeping and did not respond while this RNCM was in the room. Patient's sister reports patient had home health service previously with [facility] Patient's sister reports " He's funny, I'm not sure if he wants the services. He needs the services."   TOC will continue to follow for discharge needs.               Expected Discharge Plan: [place] Barriers to Discharge: Continued Medical Work up   Patient Goals and CMS Choice Patient states their goals for this hospitalization and ongoing recovery are:: flu like symptoms   Choice offered to / list presented to : Patient [place] ownership interest in [facility]provided to:: Patient    Expected Discharge Plan and Services In-house Referral: NA Discharge Planning Services: CM Consult Post Acute Care Choice: [place] arrangements for the past 2 months: Single Family Home                 DME Arranged: N/A DME Agency: NA       HH Arranged: RN, PT, OT          Prior Living Arrangements/Services Living arrangements for the past 2 months: Single Family Home Lives with:: Self Patient language and need for interpreter reviewed:: Yes Do you feel safe going back to the place where you live?: Yes      Need for Family Participation in Patient Care: No (Comment) Care giver support system in place?: Yes (comment) Current home services: Home PT, Home RN, Home OT Criminal Activity/Legal Involvement Pertinent to Current Situation/Hospitalization: No - Comment as  needed  Activities of Daily Living      Permission Sought/Granted Permission sought to share information with : Case Manager Permission granted to share information with : Yes, Verbal Permission Granted  Share Information with NAME: Case manager           Emotional Assessment Appearance:: Appears stated age Attitude/Demeanor/Rapport: Other (comment), Unable to Assess (patient sleeping) Affect (typically observed): Unable to Assess   Alcohol / Substance Use: Illicit Drugs (history of marijuana, cocaine) Psych Involvement: No (comment)  Admission diagnosis:  Acute exacerbation of CHF (congestive heart failure) (HCC) [I50.9] Hypertensive urgency [I16.0] Acute congestive heart failure, unspecified heart failure type [facility]) [I50.9] Patient Active Problem List   Diagnosis Date Noted   Benign prostatic hyperplasia 03/02/2023   Hyperlipidemia 03/02/2023   Acute exacerbation of CHF (congestive heart failure) ([place]) 03/01/2023   Hypertensive emergency 03/01/2023   Hypomagnesemia 03/01/2023   UTI (urinary tract infection) 03/22/2022   Hyponatremia    CVA (cerebral vascular accident) ([place])    Complicated UTI (urinary tract infection) 02/18/2020   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[place]) 02/18/2020   Epigastric abdominal pain    Epigastric pain 02/16/2020   Hypertensive urgency 02/16/2020   Cardiac murmur 02/16/2020   Acute pyelonephritis 02/16/2020   Syncope 01/23/2017   Elevated troponin    Nausea 11/01/2016   Dysuria 11/01/2016   Chronic diastolic heart failure ([place]) 03/20/2015   Chest pain 03/18/2015   History of TIA (  transient ischemic attack) 01/27/2015   Enterococcus UTI 01/27/2015   Cough 01/27/2015   Bronchitis 01/27/2015   HCAP (healthcare-associated pneumonia)    CHF (congestive heart failure) ([place]) 01/26/2015   History of alcohol abuse 01/26/2015   Pyelonephritis 01/12/2015   Prostatitis 01/12/2015   Hypertension 11/03/2014   Hypokalemia 11/03/2014    Fever 08/15/2014   Chronic pain 12/24/2013   Tobacco use disorder 12/10/2013   Leukocytosis 12/10/2013   Controlled type 2 diabetes mellitus without complication, without long-term current use of insulin ([place]) 09/06/2012   Pain in the side 09/06/2012   Pituitary adenoma ([place]) 09/06/2012   PCP:  Nolene Ebbs, MD Pharmacy:   [facility] [place], [address] - [address] [place] [address] Phone: [phone] Fax: [phone]  [place] [address] [place] [address] Phone: [phone] Fax: [phone]     Social Determinants of Health (SDOH) Social History: SDOH Screenings   Tobacco Use: High Risk (03/01/2023)   SDOH Interventions:     Readmission Risk Interventions     No data to display

## 2023-03-02 NOTE — Progress Notes (Signed)
SLP Cancellation Note  Patient Details Name: [name] MRN: [id number] DOB: 1951-08-04   Cancelled treatment:       Reason Eval/Treat Not Completed: Patient's level of consciousness;Other (comment) (SLP checked in with patient's RN in PM and she reported he is still not adequately alert but was able to take some of his meds). SLP will f/u next date.   Sonia Baller, MA, CCC-SLP Speech Therapy

## 2023-03-02 NOTE — Progress Notes (Signed)
Per niece Tomisha Harling, pt information is to only be shared with family contacts that are in the chart. Family member established a password which has been added to the chart.

## 2023-03-02 NOTE — Progress Notes (Signed)
Echocardiogram 2D Echocardiogram has been performed.  [name] 03/02/2023, 3:02 PM

## 2023-03-02 NOTE — Progress Notes (Signed)
PT Cancellation Note  Patient Details Name: [name] MRN: [id number] DOB: 11-Nov-1951   Cancelled Treatment:    Reason Eval/Treat Not Completed: Medical issues which prohibited therapy--elevated BP-on drip. Will hold PT and check back another day.    [facility]  Office: [phone]

## 2023-03-03 ENCOUNTER — Inpatient Hospital Stay (HOSPITAL_COMMUNITY): Payer: Medicare HMO

## 2023-03-03 DIAGNOSIS — B370 Candidal stomatitis: Secondary | ICD-10-CM

## 2023-03-03 DIAGNOSIS — I5032 Chronic diastolic (congestive) heart failure: Secondary | ICD-10-CM | POA: Diagnosis not present

## 2023-03-03 DIAGNOSIS — R338 Other retention of urine: Secondary | ICD-10-CM

## 2023-03-03 DIAGNOSIS — I161 Hypertensive emergency: Secondary | ICD-10-CM | POA: Diagnosis not present

## 2023-03-03 DIAGNOSIS — E119 Type 2 diabetes mellitus without complications: Secondary | ICD-10-CM | POA: Diagnosis not present

## 2023-03-03 DIAGNOSIS — I5033 Acute on chronic diastolic (congestive) heart failure: Secondary | ICD-10-CM | POA: Diagnosis not present

## 2023-03-03 LAB — CBC WITH DIFFERENTIAL/PLATELET
Abs Immature Granulocytes: 0.06 10*3/uL (ref 0.00–0.07)
Basophils Absolute: 0 10*3/uL (ref 0.0–0.1)
Basophils Relative: 0 %
Eosinophils Absolute: 0 10*3/uL (ref 0.0–0.5)
Eosinophils Relative: 0 %
HCT: 37.6 % — ABNORMAL LOW (ref 39.0–52.0)
Hemoglobin: 11.6 g/dL — ABNORMAL LOW (ref 13.0–17.0)
Immature Granulocytes: 0 %
Lymphocytes Relative: 17 %
Lymphs Abs: 2.3 10*3/uL (ref 0.7–4.0)
MCH: 22.6 pg — ABNORMAL LOW (ref 26.0–34.0)
MCHC: 30.9 g/dL (ref 30.0–36.0)
MCV: 73.2 fL — ABNORMAL LOW (ref 80.0–100.0)
Monocytes Absolute: 0.9 10*3/uL (ref 0.1–1.0)
Monocytes Relative: 7 %
Neutro Abs: 10.4 10*3/uL — ABNORMAL HIGH (ref 1.7–7.7)
Neutrophils Relative %: 76 %
Platelets: 310 10*3/uL (ref 150–400)
RBC: 5.14 MIL/uL (ref 4.22–5.81)
RDW: 15.2 % (ref 11.5–15.5)
WBC: 13.8 10*3/uL — ABNORMAL HIGH (ref 4.0–10.5)
nRBC: 0 % (ref 0.0–0.2)

## 2023-03-03 LAB — BASIC METABOLIC PANEL
Anion gap: 10 (ref 5–15)
BUN: 25 mg/dL — ABNORMAL HIGH (ref 8–23)
CO2: 23 mmol/L (ref 22–32)
Calcium: 8.9 mg/dL (ref 8.9–10.3)
Chloride: 107 mmol/L (ref 98–111)
Creatinine, Ser: 1.18 mg/dL (ref 0.61–1.24)
GFR, Estimated: >60 mL/min (ref >60)
Glucose, Bld: 136 mg/dL — ABNORMAL HIGH (ref 70–99)
Potassium: 3.2 mmol/L — ABNORMAL LOW (ref 3.5–5.1)
Sodium: 140 mmol/L (ref 135–145)

## 2023-03-03 LAB — URINE CULTURE: Culture: >=100000 — AB

## 2023-03-03 LAB — GLUCOSE, CAPILLARY
Glucose-Capillary: 127 mg/dL — ABNORMAL HIGH (ref 70–99)
Glucose-Capillary: 154 mg/dL — ABNORMAL HIGH (ref 70–99)
Glucose-Capillary: 146 mg/dL — ABNORMAL HIGH (ref 70–99)
Glucose-Capillary: 193 mg/dL — ABNORMAL HIGH (ref 70–99)
Glucose-Capillary: 114 mg/dL — ABNORMAL HIGH (ref 70–99)
Glucose-Capillary: 141 mg/dL — ABNORMAL HIGH (ref 70–99)

## 2023-03-03 LAB — PHOSPHORUS: Phosphorus: 3.3 mg/dL (ref 2.5–4.6)

## 2023-03-03 LAB — MAGNESIUM: Magnesium: 2.1 mg/dL (ref 1.7–2.4)

## 2023-03-03 MED ORDER — PANTOPRAZOLE SODIUM 40 MG PO TBEC
40.0000 mg | DELAYED_RELEASE_TABLET | Freq: Every day | ORAL | Status: DC
  Administered 2023-03-03 – 2023-03-05 (×3): 40 mg via ORAL
  Filled 2023-03-03 (×3): qty 1

## 2023-03-03 MED ORDER — CEFADROXIL 500 MG PO CAPS
1000.0000 mg | ORAL_CAPSULE | Freq: Two times a day (BID) | ORAL | Status: DC
  Administered 2023-03-03 – 2023-03-05 (×5): 1000 mg via ORAL
  Filled 2023-03-03 (×5): qty 2

## 2023-03-03 MED ORDER — INFLUENZA VAC A&B SA ADJ QUAD 0.5 ML IM PRSY
0.5000 mL | PREFILLED_SYRINGE | INTRAMUSCULAR | Status: AC | PRN
  Administered 2023-03-05: 0.5 mL via INTRAMUSCULAR
  Filled 2023-03-03: qty 0.5

## 2023-03-03 MED ORDER — NYSTATIN 100000 UNIT/ML MT SUSP
5.0000 mL | Freq: Four times a day (QID) | OROMUCOSAL | Status: DC
  Administered 2023-03-03 – 2023-03-05 (×9): 500000 [IU] via ORAL
  Filled 2023-03-03 (×8): qty 5

## 2023-03-03 MED ORDER — VERAPAMIL HCL ER 180 MG PO TBCR
180.0000 mg | EXTENDED_RELEASE_TABLET | Freq: Every day | ORAL | Status: DC
  Administered 2023-03-03 – 2023-03-04 (×2): 180 mg via ORAL
  Filled 2023-03-03 (×2): qty 1

## 2023-03-03 MED ORDER — CARVEDILOL 12.5 MG PO TABS
25.0000 mg | ORAL_TABLET | Freq: Two times a day (BID) | ORAL | Status: DC
  Administered 2023-03-03 – 2023-03-05 (×5): 25 mg via ORAL
  Filled 2023-03-03 (×5): qty 2

## 2023-03-03 MED ORDER — POTASSIUM CHLORIDE CRYS ER 10 MEQ PO TBCR
40.0000 meq | EXTENDED_RELEASE_TABLET | ORAL | Status: AC
  Administered 2023-03-03 (×2): 40 meq via ORAL
  Filled 2023-03-03 (×2): qty 4

## 2023-03-03 MED ORDER — LOSARTAN POTASSIUM 50 MG PO TABS: 100.0000 mg | ORAL_TABLET | Freq: Every day | ORAL | Status: DC

## 2023-03-03 MED ORDER — PNEUMOCOCCAL 20-VAL CONJ VACC 0.5 ML IM SUSY: 0.5000 mL | PREFILLED_SYRINGE | INTRAMUSCULAR | Status: DC | PRN

## 2023-03-03 NOTE — Evaluation (Signed)
Clinical/Bedside Swallow Evaluation Patient Details  Name: [name] MRN: [id number] Date of Birth: January 22, 1951  Today's Date: 03/03/2023 Time: SLP Start Time (ACUTE ONLY): [id number] SLP Stop Time (ACUTE ONLY): [id number] SLP Time Calculation (min) (ACUTE ONLY): 23 min  Past Medical History:  Past Medical History:  Diagnosis Date   Alcohol dependence ([place]) 12/10/2013   Angioedema of lips 12/24/2013   Brain tumor (HCC)    CHF (congestive heart failure) (HCC)    diastolic   Chronic back pain    Chronic headache    CVA (cerebral vascular accident) ([place])    Diabetes mellitus    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[place]) 02/18/2020   HTN (hypertension), malignant 09/06/2012   Hypertension    Narcotic abuse ([place])    Normal cardiac stress test 2009   TIA (transient ischemic attack) 09/06/2012   Tobacco use disorder 12/10/2013   UTI (urinary tract infection)    Vertigo 09/06/2012   Past Surgical History:  Past Surgical History:  Procedure Laterality Date   NO PAST SURGERIES     HPI:  72 yo male adm with 2 day h/o rhinovirus, nasal congestiona nd dyspnea.  Prior history + for alcohol dependence, history of pituitary brain tumor, history of chronic diastolic CHF, chronic back pain, chronic headaches, prior history of CVA, type 2 diabetes, hypertension, prior history of narcotic abuse, ongoing tobacco use, and potential BPH.  Pt with Workup concerning for hypertensive emergency in the setting of CHF exacerbation, probable UTI.  Pt CXR showing Progressive enlargement of the cardiopericardial silhouette  concerning for increasing cardiomegaly.  2. Pulmonary vascular congestion with diffuse fine interstitial  prominence. Differential considerations include early interstitial  edema versus atypical/viral respiratory infection.    Swallow eval ordered as RN reports pt coughing with liquids.    Assessment / Plan / Recommendation  Clinical Impression  Patient presents with overall functional  oropharyngeal swallow ability. No indication of aspiration or significant dysphagia across all po trials including medications with water, jello and graham cracker.  His dentition is inadequate but he compensates well by masticating well.  Pt reports he has partials at home but does not use them.  He does have xerostomia, thus advised to start po with liquids.  Also observed pt to extend chin upward to swallow pills - advised him to try to refrain from this behavior due to increased aspiration risk.  Discussed placing pill in mouth with water already in mouth to "float" the pill.   Eructation noted x1- pt denies refluxing - Admits to having expectorated copious amounts of secretions prior to admit - "liquid coming from mouth".  No follow up needed. Thanks for this order. SLP Visit Diagnosis: Dysphagia, unspecified (R13.10)    Aspiration Risk  Mild aspiration risk    Diet Recommendation Regular;Thin liquid   Liquid Administration via: Straw Medication Administration: Whole meds with liquid Supervision: Patient able to self feed Compensations: Slow rate;Small sips/bites Postural Changes: Seated upright at 90 degrees;Remain upright for at least 30 minutes after po intake    Other  Recommendations Oral Care Recommendations: Oral care BID    Recommendations for follow up therapy are one component of a multi-disciplinary discharge planning process, led by the attending physician.  Recommendations may be updated based on patient status, additional functional criteria and insurance authorization.  Follow up Recommendations No SLP follow up      Assistance Recommended at Discharge  N/a  Functional Status Assessment Patient has not had a recent decline in their functional status  Frequency and Duration         N/a   Prognosis    N/a    Swallow Study   General HPI: 72 yo male adm with 2 day h/o rhinovirus, nasal congestiona nd dyspnea.  Prior history + for alcohol dependence, history of  pituitary brain tumor, history of chronic diastolic CHF, chronic back pain, chronic headaches, prior history of CVA, type 2 diabetes, hypertension, prior history of narcotic abuse, ongoing tobacco use, and potential BPH.  Pt with Workup concerning for hypertensive emergency in the setting of CHF exacerbation, probable UTI.  Pt CXR showing Progressive enlargement of the cardiopericardial silhouette  concerning for increasing cardiomegaly.  2. Pulmonary vascular congestion with diffuse fine interstitial  prominence. Differential considerations include early interstitial  edema versus atypical/viral respiratory infection.    Swallow eval ordered as RN reports pt coughing with liquids. Type of Study: Bedside Swallow Evaluation Diet Prior to this Study: Regular;Thin liquids (Level 0) Temperature Spikes Noted: No Respiratory Status: Nasal cannula History of Recent Intubation: No Behavior/Cognition: Alert;Cooperative Oral Cavity Assessment: Other (comment) (white coating on tongue) Oral Cavity - Dentition: Missing dentition Vision: Functional for self-feeding Self-Feeding Abilities: Able to feed self Patient Positioning: Upright in bed Baseline Vocal Quality: Normal Volitional Cough: Strong Volitional Swallow: Able to elicit    Oral/Motor/Sensory Function Overall Oral Motor/Sensory Function: Within functional limits   Ice Chips Ice chips: Not tested   Thin Liquid Thin Liquid: Within functional limits Presentation: Straw;Self Fed    Nectar Thick Nectar Thick Liquid: Not tested   Honey Thick Honey Thick Liquid: Not tested   Puree Puree: Within functional limits Presentation: Self Fed;Spoon   Solid     Solid: Impaired Presentation: Self Fed Oral Phase Functional Implications: Prolonged oral transit;Other (comment)      [name] 03/03/2023,11:24 AM  Samuel Lime, MS [place] Office [phone]

## 2023-03-03 NOTE — Evaluation (Signed)
Occupational Therapy Evaluation Patient Details Name: [name] MRN: [id number] DOB: 07/23/1951 Today's Date: 03/03/2023   History of Present Illness Patient is a 72 year old male who presented with 2 day history of shortness of breath, nasal congestion and rhinorrhea. Patient was found to have hypertensive emergency with CHF exacerbation, H[facility], and probable UTI. Patient was placed on nitro drip and transitioned to SDU. PMH: tobacco use, h/o TIA, CVA, acute urinary urgency, hypomagnesemia,benign prostatic hyperplasia.   Clinical Impression   Patient is a 72 year old male who was admitted for above. Patient reported living at home alone with PRN support from family. Patient currently needed physical A to maintain standing balance with LOB while turning in hallway with no AD. Patient was noted to have decreased functional activity tolerance, decreased endurance, decreased standing balance, decreased safety awareness, and decreased knowledge of AD/AE impacting participation in ADLs. Patient would continue to benefit from skilled OT services at this time while admitted and after d/c to address noted deficits in order to improve overall safety and independence in ADLs.       Recommendations for follow up therapy are one component of a multi-disciplinary discharge planning process, led by the attending physician.  Recommendations may be updated based on patient status, additional functional criteria and insurance authorization.   Follow Up Recommendations  No OT follow up     Assistance Recommended at Discharge Frequent or constant Supervision/Assistance  Patient can return home with the following A little help with walking and/or transfers;A little help with bathing/dressing/bathroom;Direct supervision/assist for financial management;Help with stairs or ramp for entrance;Assist for transportation;Direct supervision/assist for medications management;Assistance with cooking/housework     Functional Status Assessment  Patient has had a recent decline in their functional status and demonstrates the ability to make significant improvements in function in a reasonable and predictable amount of time.  Equipment Recommendations  None recommended by OT       Precautions / Restrictions Precautions Precautions: Fall Precaution Comments: monitor O2 Restrictions Weight Bearing Restrictions: No      Mobility Bed Mobility Overal bed mobility: Needs Assistance Bed Mobility: Supine to Sit     Supine to sit: Supervision     General bed mobility comments: safety and line management       Balance Overall balance assessment: Mild deficits observed, not formally tested                 ADL either performed or assessed with clinical judgement   ADL Overall ADL's : Needs assistance/impaired Eating/Feeding: Set up;Sitting   Grooming: Set up;Sitting   Upper Body Bathing: Set up;Sitting   Lower Body Bathing: Minimal assistance;Sitting/lateral leans   Upper Body Dressing : Set up;Sitting   Lower Body Dressing: Minimal assistance;Sitting/lateral leans   Toilet Transfer: Ambulation;Minimal assistance Toilet Transfer Details (indicate cue type and reason): to wlak around unit with one LOB noted with turning with patient reporting "my bad". patient was motivated to walk reporting he did not have any deficits with unseadiness noted and need for physical A to maintain balance. Toileting- Clothing Manipulation and Hygiene: Minimal assistance;Sit to/from stand       Functional mobility during ADLs: Minimal assistance       Vision   Vision Assessment?: No apparent visual deficits            Pertinent Vitals/Pain Pain Assessment Pain Assessment: No/denies pain     Hand Dominance Right   Extremity/Trunk Assessment Upper Extremity Assessment Upper Extremity Assessment: Overall WFL for tasks  assessed   Lower Extremity Assessment Lower Extremity Assessment:  Generalized weakness   Cervical / Trunk Assessment Cervical / Trunk Assessment: Normal   Communication Communication Communication: No difficulties   Cognition Arousal/Alertness: Awake/alert Behavior During Therapy: Flat affect Overall Cognitive Status: Difficult to assess       General Comments: patient was not forthcoming with information during session. Patient knew the day and rpeorted 4th for date but able to change to 5th. ex-fiance was present in room.     General Comments  patient was able to maintain O2 on RA 92% or above during mobility with RR around [place] expects to be discharged to:: Private residence Living Arrangements: Alone Available Help at Discharge: Family;Friend(s);Available PRN/intermittently Type of Home: House Home Access: Stairs to enter [employer] of Steps: 2-3   Home Layout: One level     Bathroom Shower/Tub: Walk-in shower         Home Equipment: Conservation officer, nature (2 wheels)          Prior Functioning/Environment Prior Level of Function : Independent/Modified Independent            OT Problem List: Decreased activity tolerance;Impaired balance (sitting and/or standing);Decreased coordination;Decreased safety awareness;Decreased knowledge of precautions;Decreased knowledge of use of DME or AE;Cardiopulmonary status limiting activity      OT Treatment/Interventions: Self-care/ADL training;Energy conservation;Therapeutic exercise;DME and/or AE instruction;Balance training;Patient/family education;Therapeutic activities    OT Goals(Current goals can be found in the care plan section) Acute Rehab OT Goals Patient Stated Goal: to get home OT Goal Formulation: With patient Time For Goal Achievement: 03/17/23 Potential to Achieve Goals: Fair ADL Goals Pt Will Perform Lower Body Dressing: with modified independence;sit to/from stand Pt Will Transfer to Toilet: with modified  independence;ambulating;regular height toilet Pt Will Perform Toileting - Clothing Manipulation and hygiene: with modified independence;sit to/from stand Additional ADL Goal #1: Patient will stand at sink to perform grooming task as evidence of improving activity tolerance  OT Frequency: Min 2X/week    Co-evaluation PT/OT/SLP Co-Evaluation/Treatment: Yes Reason for Co-Treatment: Necessary to address cognition/behavior during functional activity PT goals addressed during session: Mobility/safety with mobility OT goals addressed during session: ADL's and self-care      AM-PAC OT "6 Clicks" Daily Activity     Outcome Measure Help from another person eating meals?: None Help from another person taking care of personal grooming?: A Little Help from another person toileting, which includes using toliet, bedpan, or urinal?: A Little Help from another person bathing (including washing, rinsing, drying)?: A Little Help from another person to put on and taking off regular upper body clothing?: A Little Help from another person to put on and taking off regular lower body clothing?: A Little 6 Click Score: 19   End of Session Equipment Utilized During Treatment: Gait belt Nurse Communication: Other (comment) (ok to participate)  Activity Tolerance: Patient tolerated treatment well Patient left: in chair;with call bell/phone within reach;with chair alarm set;with family/visitor present  OT Visit Diagnosis: Unsteadiness on feet (R26.81);Other abnormalities of gait and mobility (R26.89);Muscle weakness (generalized) (M62.81)                Time: [id number] OT Time Calculation (min): 17 min Charges:  OT General Charges $OT Visit: 1 Visit OT Evaluation $OT Eval Moderate Complexity: 1 Mod  Guerino Caporale OTR/L, MS Acute Rehabilitation Department Office# [phone]   Willa Rough 03/03/2023, 2:59 PM

## 2023-03-03 NOTE — Progress Notes (Addendum)
Rounding Note    Patient Name: [name] Date of Encounter: 03/03/2023  [place] Cardiologist: None   Subjective   Patient denies chest pain, shortness of breath, palpitations, dizziness, syncope/near syncope, headache this AM. Reports he is feeling well  Echocardiogram yesterday was consistent with HOCM physiology. [place]ieces are at bedside. They deny family history of sudden cardiac death. Discussed genetic testing for first degree relatives.   Inpatient Medications    Scheduled Meds:  amLODipine  5 mg Oral Daily   aspirin EC  81 mg Oral q morning   atorvastatin  40 mg Oral Daily   carvedilol  6.25 mg Oral BID WC   Chlorhexidine Gluconate Cloth  6 each Topical Daily   enoxaparin (LOVENOX) injection  40 mg Subcutaneous Q24H   fluticasone  2 spray Each Nare Daily   furosemide  40 mg Intravenous Q12H   guaiFENesin  1,200 mg Oral BID   hydrALAZINE  100 mg Oral Q8H   insulin aspart  0-9 Units Subcutaneous TID WC   loratadine  10 mg Oral Daily   losartan  100 mg Oral Daily   pantoprazole (PROTONIX) IV  40 mg Intravenous Daily   sodium chloride flush  3 mL Intravenous Q12H   tamsulosin  0.8 mg Oral QHS   Continuous Infusions:  sodium chloride Stopped (03/01/23 1613)   cefTRIAXone (ROCEPHIN)  IV Stopped (03/02/23 1220)   nitroGLYCERIN 5 mcg/min (03/03/23 0652)   PRN Meds: sodium chloride, acetaminophen, diclofenac Sodium, dicyclomine, hydrOXYzine, loperamide, methocarbamol, morphine injection, naproxen, nicotine, ondansetron (ZOFRAN) IV, ondansetron, mouth rinse, sodium chloride flush   Vital Signs    Vitals:   03/03/23 0700 03/03/23 0715 03/03/23 0730 03/03/23 0745  BP: (!) 146/107 (!) 160/50 (!) 171/41 (!) 182/65  Pulse: (!) 106 96 96 96  Resp: (!) 27 20 (!) 28 (!) 22  Temp:      TempSrc:      SpO2: 98% 95% 95% 94%  Weight:      Height:        Intake/Output Summary (Last 24 hours) at 03/03/2023 0756 Last data filed at 03/03/2023 [id number] Gross per 24  hour  Intake 584.88 ml  Output 1855 ml  Net -1270.12 ml      03/03/2023    4:14 AM 03/02/2023    1:44 AM 03/01/2023    3:15 PM  Last 3 Weights  Weight (lbs) 145 lb 8.1 oz 150 lb 2.1 oz 148 lb 9.4 oz  Weight (kg) 66 kg 68.1 kg 67.4 kg      Telemetry    Sinus rhythm, HR 90s-100s - Personally Reviewed  ECG     No new tracings today - Personally Reviewed  Physical Exam   GEN: Thin, elderly male. Laying in bed with  head elevated. No acute distress  Neck: No JVD Cardiac: RRR, harsh 4/6 systolic murmur throughout  Respiratory: Crackles in lung bases, otherwise clear to auscultation. Normal WOB  GI: Soft, nontender, non-distended  MS: No edema in BLE; No deformity. Neuro:  Nonfocal  Psych: Normal affect   Labs    High Sensitivity Troponin:   Recent Labs  Lab 03/01/23 1233 03/01/23 1345 03/01/23 1827 03/02/23 0801  TROPONINIHS 39* 326* 292* 191*     Chemistry Recent Labs  Lab 03/01/23 0657 03/01/23 1233 03/02/23 0801  NA 140  --  141  K 3.2*  --  2.8*  CL 110  --  107  CO2 23  --  22  GLUCOSE 108*  --  139*  BUN 19  --  22  CREATININE 0.80  --  0.99  CALCIUM 8.9  --  9.0  MG  --  1.5* 2.2  PROT 7.9  --   --   ALBUMIN 3.7  --   --   AST 20  --   --   ALT 13  --   --   ALKPHOS 60  --   --   BILITOT 0.9  --   --   GFRNONAA >60  --  >60  ANIONGAP 7  --  12    Lipids  Recent Labs  Lab 03/02/23 0801  CHOL 207*  TRIG 98  HDL 35*  LDLCALC 152*  CHOLHDL 5.9    Hematology Recent Labs  Lab 03/01/23 0657 03/02/23 0801  WBC 9.5 13.6*  RBC 5.82* 5.29  HGB 12.9* 11.8*  HCT 42.0 37.7*  MCV 72.2* 71.3*  MCH 22.2* 22.3*  MCHC 30.7 31.3  RDW 15.6* 15.1  PLT 263 293   Thyroid No results for input(s): "TSH", "FREET4" in the last 168 hours.  BNP Recent Labs  Lab 03/01/23 0657 03/02/23 0801  BNP 894.6* 764.5*    DDimer No results for input(s): "DDIMER" in the last 168 hours.   Radiology    ECHOCARDIOGRAM COMPLETE  Result Date: 03/02/2023     ECHOCARDIOGRAM REPORT   Patient Name:   [name] Date of Exam: 03/02/2023 Medical Rec #:  [id number]       Height:       69.0 in Accession #:    [id number]      Weight:       150.1 lb Date of Birth:  04-Nov-1951       BSA:          1.829 m Patient Age:    72 years        BP:           139/38 mmHg Patient Gender: M               HR:           95 bpm. Exam Location:  Inpatient Procedure: 2D Echo, Cardiac Doppler and Color Doppler Indications:    [id number] Acute diastolic (congestive) heart failure  History:        Patient has prior history of Echocardiogram examinations. CHF,                 Stroke; Risk Factors:Diabetes and Hypertension.  Sonographer:    Phineas Douglas Referring Phys: [phone] DANIEL V THOMPSON IMPRESSIONS  1. LV systolic function is vigorous with near cavity obliteration during systole. Turbulent flow through LVOT consistent with HOCM physiology Peak gradient through LVOT approximately 100 mm Hg. Marland Kitchen Left ventricular ejection fraction, by estimation, is 70 to 75%. The left ventricle has hyperdynamic function. There is mild left ventricular hypertrophy. Left ventricular diastolic parameters were normal.  2. Right ventricular systolic function is normal. The right ventricular size is normal.  3. A small pericardial effusion is present.  4. Systolic anterior motion of the anterior mitral leaflet. . Mild mitral valve regurgitation.  5. The aortic valve is tricuspid. Aortic valve regurgitation is mild to moderate. Aortic valve sclerosis/calcification is present, without any evidence of aortic stenosis. FINDINGS  Left Ventricle: LV systolic function is vigorous with near cavity obliteration during systole. Turbulent flow through LVOT consistent with HOCM physiology Peak gradient through LVOT approximately 100 mm Hg. Left ventricular ejection fraction, by estimation, is 70 to 75%. The  left ventricle has hyperdynamic function. The left ventricular internal cavity size was normal in size. There is mild left  ventricular hypertrophy. Left ventricular diastolic parameters were normal. Right Ventricle: The right ventricular size is normal. Right vetricular wall thickness was not assessed. Right ventricular systolic function is normal. Left Atrium: Left atrial size was normal in size. Right Atrium: Right atrial size was normal in size. Pericardium: A small pericardial effusion is present. Mitral Valve: Systolic anterior motion of the anterior mitral leaflet. There is mild thickening of the mitral valve leaflet(s). Mild mitral valve regurgitation. Tricuspid Valve: The tricuspid valve is normal in structure. Tricuspid valve regurgitation is trivial. Aortic Valve: The aortic valve is tricuspid. Aortic valve regurgitation is mild to moderate. Aortic regurgitation PHT measures 587 msec. Aortic valve sclerosis/calcification is present, without any evidence of aortic stenosis. Pulmonic Valve: The pulmonic valve was not well visualized. Pulmonic valve regurgitation is not visualized. Aorta: The aortic root and ascending aorta are structurally normal, with no evidence of dilitation. IAS/Shunts: No atrial level shunt detected by color flow Doppler.  LEFT VENTRICLE PLAX 2D LVIDd:         4.10 cm      Diastology LVIDs:         2.20 cm      LV e' medial:    6.09 cm/s LV PW:         1.10 cm      LV E/e' medial:  8.4 LV IVS:        1.10 cm      LV e' lateral:   6.85 cm/s LVOT diam:     2.00 cm      LV E/e' lateral: 7.5 LVOT Area:     3.14 cm  LV Volumes (MOD) LV vol d, MOD A2C: 87.0 ml LV vol d, MOD A4C: 102.0 ml LV vol s, MOD A2C: 28.7 ml LV vol s, MOD A4C: 28.8 ml LV SV MOD A2C:     58.3 ml LV SV MOD A4C:     102.0 ml LV SV MOD BP:      66.7 ml RIGHT VENTRICLE             IVC RV Basal diam:  3.40 cm     IVC diam: 1.50 cm RV S prime:     16.10 cm/s TAPSE (M-mode): 1.9 cm LEFT ATRIUM             Index        RIGHT ATRIUM           Index LA diam:        3.70 cm 2.02 cm/m   RA Area:     14.80 cm LA Vol (A2C):   47.6 ml 26.03 ml/m  RA  Volume:   30.70 ml  16.79 ml/m LA Vol (A4C):   46.3 ml 25.32 ml/m LA Biplane Vol: 49.0 ml 26.79 ml/m  AORTIC VALVE AI PHT:      587 msec  AORTA Ao Root diam: 3.20 cm Ao Asc diam:  3.00 cm MITRAL VALVE MV Area (PHT): 4.12 cm    SHUNTS MV Decel Time: 184 msec    Systemic Diam: 2.00 cm MV E velocity: 51.40 cm/s MV A velocity: 93.40 cm/s MV E/A ratio:  0.55 Dorris Carnes MD Electronically signed by Dorris Carnes MD Signature Date/Time: 03/02/2023/6:15:00 PM    Final    DG Abd 2 Views  Result Date: 03/01/2023 CLINICAL DATA:  Vomiting and abdominal pain EXAM: ABDOMEN - 2 VIEW  COMPARISON:  X-ray 11/03/2014.  CT 08/19/2022 FINDINGS: Gas is seen in nondilated loops of small and large bowel. Scattered stool. Few air-filled mildly distended loops of small bowel right upper quadrant. Gas and stool in the rectum. No obstruction. No obvious free air beneath the diaphragm on the upright view. Degenerative changes of the spine. There are some presumed vascular calcifications along the pelvis. Overlapping cardiac leads. IMPRESSION: Nonspecific bowel gas pattern. Electronically Signed   By: Jill Side M.D.   On: 03/01/2023 14:47    Cardiac Studies   Echocardiogram 03/02/23 1. LV systolic function is vigorous with near cavity obliteration during  systole. Turbulent flow through LVOT consistent with HOCM physiology Peak  gradient through LVOT approximately 100 mm Hg. Marland Kitchen Left ventricular ejection  fraction, by estimation, is 70  to 75%. The left ventricle has hyperdynamic function. There is mild left  ventricular hypertrophy. Left ventricular diastolic parameters were  normal.   2. Right ventricular systolic function is normal. The right ventricular  size is normal.   3. A small pericardial effusion is present.   4. Systolic anterior motion of the anterior mitral leaflet. . Mild mitral  valve regurgitation.   5. The aortic valve is tricuspid. Aortic valve regurgitation is mild to  moderate. Aortic valve  sclerosis/calcification is present, without any  evidence of aortic stenosis.   Patient Profile     72 y.o. male with a hx of HOCM, chronic diastolic heart failure, history of alcohol dependence, history of brain tumor, chronic back pain, chronic headaches, history of CVA, type 2 DM, HTN, history of narcotic abuse, ongoing tobacco use who is being seen 03/02/2023 for the evaluation of acute on chronic diastolic heart failure, HTN emergency   Assessment & Plan    Acute on chronic diastolic heart failure HOCM  - Most recent echocardiogram from 02/17/2020 showed hyperdynamic LV with LVOT obstruction, chordal SAM with minimal anterior mitral valve SAM, mild LVH that is not asymmetric.  Findings were concerning for HOCM, recommended cardiac MRI for clarification.  However, cardiac MRI was not completed. - Patient presented complaining of 2 days of upper respiratory-like symptoms including nasal congestion, cough, shortness of breath.  COVID, flu, RSV negative.  BNP elevated to 894.6.  Chest x-ray with increased cardiopericardial silhouette concerning for increasing cardiomegaly, pulmonary vascular congestion  - Echocardiogram yesterday showed vigorous LV systolic function with near cavity obliteration during systole, turbulent flow through LVOT consistent with HOCM physiology. Peak gradient through LVOT 178mHg  - Patient will need cardiac MRI- patient has been noncompliant with cardiology follow up in the past, may be best to do as an inpatient  - Patient has been diuresing on IV lasix 40 mg BID- output 1.855 L urine yesterday - Stop IV lasix - hold off on PO lasix to prevent intravascular volume depletion with HOCM  - Increase carvedilol to 25 mg BID - patient continues to be hypertensive and HR is in the 90s-100s  - Stop amlodipine in case we need to add verapamil in the future  - Continue losartan 100 mg daily for now-- will discuss with MD if he needs to be taken off ARB for HOCM  - no SGLT2i given  history of frequent UTIs, risk of intravascular volume depletion with HOCM  - Discussed genetic testing for first degree relatives. Also discussed symptoms of HOCM, risk of sudden cardiac death. Encouraged hydration  - If patient is agreeable/consistent with cardiology follow up, consider arranging an appointment with Dr. [facility]  Hypertensive emergency - Patient was significantly hypertensive in the ED with blood pressure getting as high as 248/58 - Continue losartan 100 mg daily, hydralazine 100 mg 3 times daily - Stop amlodipine  - Increase carvedilol to 25 mg BID as above  - Wean IV nitro as tolerated. Down to 5 mcg/min this AM    Elevated troponins -High sensitive troponin [phone] - Suspect this is demand ischemia/type 2 MI in the setting of hypertensive emergency. Patient denies chest pain  - Echocardiogram with evidence of HOCM   Otherwise per primary  - Possible UTI - Hypokalemia/hypomagnesemia -- supplemented  - History of CVA  - Type 2 DM   For questions or updates, please contact [place] Please consult [email or web address] for contact info under  Signed, Margie Billet, PA-C  03/03/2023, 7:56 AM    Patient seen and examined, note reviewed with the signed Advanced Practice Provider. I personally reviewed laboratory data, imaging studies and relevant notes. I independently examined the patient and formulated the important aspects of the plan. I have personally discussed the plan with the patient and/or family. Comments or changes to the note/plan are indicated below.  Patient seen and examined at his bedside. He was awake and able to speak with me today.  He reports feeling better.  I did spend time speaking to the patient again about the diagnosis of HCM again. I discuss the aspect of follow up with goals to understand any social barrier to this as well. He really did not share much but I believe he will benefit from a social work  consult.   Stop losartan and diuretics.  Will increase his coreg Stop Amlodipine and start verapamil.    Berniece Salines DO, MS [facility] Attending Cardiologist [place]  [address] [place], [place] [address] [phone] Website: [email or web address]

## 2023-03-03 NOTE — Evaluation (Signed)
Physical Therapy Evaluation Patient Details Name: [name] MRN: [id number] DOB: May 19, 1951 Today's Date: 03/03/2023  History of Present Illness  Patient is a 72 year old male who presented with 2 day history of shortness of breath, nasal congestion and rhinorrhea. Patient was found to have hypertensive emergency with CHF exacerbation, [facility], and probable UTI. Patient was placed on nitro drip and transitioned to SDU. PMH: tobacco use, h/o TIA, CVA, acute urinary urgency, hypomagnesemia,benign prostatic hyperplasia.  Clinical Impression  Pt admitted with above diagnosis.  Pt currently with functional limitations due to the deficits listed below (see PT Problem List). Pt will benefit from skilled PT to increase their independence and safety with mobility to allow discharge to the venue listed below.     The patient received resting in bed,  out of nares, SPO2 98%. Lowest noted SPO2 90%.  The patient demonstrated generalized weakness and mild balance  disturbance when ambulating, not using A device.  Patient  should progress to return to independent ambulation with continued mobility.     Recommendations for follow up therapy are one component of a multi-disciplinary discharge planning process, led by the attending physician.  Recommendations may be updated based on patient status, additional functional criteria and insurance authorization.  Follow Up Recommendations No PT follow up      Assistance Recommended at Discharge PRN  Patient can return home with the following  Assistance with cooking/housework;Assist for transportation;Help with stairs or ramp for entrance    Equipment Recommendations None recommended by PT  Recommendations for Other Services       Functional Status Assessment Patient has had a recent decline in their functional status and demonstrates the ability to make significant improvements in function in a reasonable and predictable amount of time.     Precautions  / Restrictions Precautions Precautions: Fall Precaution Comments: monitor O2 Restrictions Weight Bearing Restrictions: No      Mobility  Bed Mobility   Bed Mobility: Supine to Sit     Supine to sit: Supervision     General bed mobility comments: safety and line management    Transfers   Equipment used: None               General transfer comment: min guard to stand from bed , mildly unsteady    Ambulation/Gait Ambulation/Gait assistance: Min assist, +2 safety/equipment, Min guard Gait Distance (Feet): 210 Feet Assistive device: None Gait Pattern/deviations: Step-through pattern, Decreased stride length, Drifts right/left Gait velocity: decr     General Gait Details: intermittently noted to have decreased balance, with needed support to regain balance  Stairs            Wheelchair Mobility    Modified Rankin (Stroke Patients Only)       Balance Overall balance assessment: Needs assistance Sitting-balance support: No upper extremity supported, Feet supported Sitting balance-Leahy Scale: Good     Standing balance support: No upper extremity supported Standing balance-Leahy Scale: Fair Standing balance comment: static standing , fair, high dynamic, poor                             Pertinent Vitals/Pain Pain Assessment Pain Assessment: No/denies pain    Home Living Family/patient expects to be discharged to:: Private residence Living Arrangements: Alone Available Help at Discharge: Family;Friend(s);Available PRN/intermittently Type of Home: House Home Access: Stairs to enter   Entrance Stairs-Number of Steps: 2-3   Home Layout: One level Home Equipment: Rolling  Walker (2 wheels)      Prior Function Prior Level of Function : Independent/Modified Independent                     Hand Dominance   Dominant Hand: Right    Extremity/Trunk Assessment   Upper Extremity Assessment Upper Extremity Assessment: Overall  WFL for tasks assessed    Lower Extremity Assessment Lower Extremity Assessment: Generalized weakness    Cervical / Trunk Assessment Cervical / Trunk Assessment: Normal  Communication   Communication: No difficulties  Cognition Arousal/Alertness: Awake/alert Behavior During Therapy: Flat affect Overall Cognitive Status: Difficult to assess                                 General Comments: patient was not forthcoming with information during session. Patient knew the day and rpeorted 4th for date but able to change to 5th. ex-fiance was present in room.        General Comments General comments (skin integrity, edema, etc.): patient was able to maintain O2 on RA 92% or above during mobility with RR around 23    Exercises     Assessment/Plan    PT Assessment Patient needs continued PT services  PT Problem List Decreased strength;Decreased activity tolerance;Decreased mobility;Decreased balance;Decreased safety awareness       PT Treatment Interventions Therapeutic activities;Balance training;Gait training;Functional mobility training;Therapeutic exercise;Patient/family education    PT Goals (Current goals can be found in the Care Plan section)  Acute Rehab PT Goals Patient Stated Goal: go home PT Goal Formulation: With patient Time For Goal Achievement: 03/17/23 Potential to Achieve Goals: Good    Frequency Min 3X/week     Co-evaluation PT/OT/SLP Co-Evaluation/Treatment: Yes Reason for Co-Treatment: Necessary to address cognition/behavior during functional activity PT goals addressed during session: Mobility/safety with mobility OT goals addressed during session: ADL's and self-care       AM-PAC PT "6 Clicks" Mobility  Outcome Measure Help needed turning from your back to your side while in a flat bed without using bedrails?: None Help needed moving from lying on your back to sitting on the side of a flat bed without using bedrails?: None Help  needed moving to and from a bed to a chair (including a wheelchair)?: A Little Help needed standing up from a chair using your arms (e.g., wheelchair or bedside chair)?: A Little Help needed to walk in hospital room?: A Little Help needed climbing 3-5 steps with a railing? : A Lot 6 Click Score: 19    End of Session Equipment Utilized During Treatment: Gait belt Activity Tolerance: Patient tolerated treatment well Patient left: in chair;with chair alarm set;with family/visitor present Nurse Communication: Mobility status PT Visit Diagnosis: Unsteadiness on feet (R26.81);Muscle weakness (generalized) (M62.81)    Time: [id number] PT Time Calculation (min) (ACUTE ONLY): 18 min   Charges:   PT Evaluation $PT Eval Low Complexity: 1 Low          [place] Office [phone] Weekend [id number]   Claretha Cooper 03/03/2023, 2:09 PM

## 2023-03-03 NOTE — Progress Notes (Signed)
PROGRESS NOTE    [name]  [id number] DOB: [birth year] DOA: 03/01/2023 PCP: Nolene Ebbs, MD    No chief complaint on file.   Brief Narrative: Patient 72 year old gentleman history of HOCM, prior history of alcohol dependence, history of brain tumor, history of chronic diastolic CHF, chronic back pain, chronic headaches, prior history of CVA, type 2 diabetes, hypertension, prior history of narcotic abuse, ongoing tobacco use,?  BPH who presented to the ED with 2-day history of rhinorrhea, nasal congestion, shortness of breath.  Workup concerning for hypertensive emergency in the setting of CHF exacerbation, probable UTI.  Patient placed on the nitro drip, IV antibiotics, 2D echo ordered.  Cardiology consulted.   Assessment & Plan:   Principal Problem:   Acute exacerbation of CHF (congestive heart failure) (HCC) Active Problems:   Hypertensive emergency   Controlled type 2 diabetes mellitus without complication, without long-term current use of insulin (HCC)   Tobacco use disorder   Hypokalemia   History of TIA (transient ischemic attack)   Chronic diastolic heart failure (HCC)   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HCC)   UTI (urinary tract infection)   CVA (cerebral vascular accident) ([place])   Hypomagnesemia   Benign prostatic hyperplasia   Hyperlipidemia   Acute urinary retention   Oral thrush  #1 acute on chronic diastolic CHF exacerbation/Hx HOCM -Likely secondary to hypertensive emergency -Patient presented with a 2-day history of upper respiratory symptoms including rhinorrhea, nasal congestion, cough, shortness of breath. -Respiratory viral panel with SARS coronavirus 2 negative, influenza A AND B negative, RSV negative. -Patient noted to have elevated BNP of 894.6. -Chest x-ray with increasing cardiopericardial silhouette concern for volume overload. -Patient noted to be in hypertensive emergency on presentation knows he only takes 1 blood pressure  medication however per med rec looks like he is supposed to be on more than 1 antihypertensive medication. -Urine output of 1855 cc over the past 24 hours. -Respiratory viral panel negative. -Cardiac enzymes elevated and trending down. -2D echo with a EF of 75%, hyperdynamic left ventricle, mild LVH, troponin fluids through LVOT consistent with HOCM physiology peak gradient through LVOT approximately 100 mmHg.  .   -Clinical improvement. -Nitro drip being weaned down. -IV Lasix discontinued per cardiology. -Potassium, ARB, Norvasc discontinued per cardiology. -Patient started on Coreg and dose uptitrated per cardiology, patient started on verapamil.  Continue hydralazine 100 mg every 8 hours. -Continue Claritin, Flonase, Mucinex, PPI, ARB.   -Cardiology recommended cardiac MRI during this hospitalization. -Strict I's and O's, daily weights. -Supportive care.   2.  Hypertensive emergency -??  Etiology. -Patient not sure of which antihypertensive medications he is on however states he takes an antihypertensive medication 3 times a day. -Med rec looks like he is supposed to be on more than 1 antihypertensive medication. -Patient presented in CHF exacerbation. -Cardiac enzymes noted to be elevated but trending down.   -UDS negative.   -2D echo with a EF of 75%, hyperdynamic left ventricle, mild LVH, troponin fluids through LVOT consistent with HOCM physiology peak gradient through LVOT approximately 100 mmHg.  .   -Per RN family states patient uses heroin.  -Blood pressure improving, nitro drip being weaned down.   -Patient being followed by cardiology and antihypertensive medications being adjusted.   -IV Lasix discontinued, Norvasc discontinued this morning per cardiology.  Coreg dose increased to 25 mg p.o. twice daily.  -Continue hydralazine 100 mg p.o. every 8 hours.  -Patient started on verapamil 180 mg daily per cardiology.  -  ARB discontinued per cardiology.  -Appreciate cardiology  input and recommendations.  3.  Elevated troponins -Likely secondary to demand ischemia from hypertensive emergency. -Troponins trending down. -2D echo with a EF of 75%, hyperdynamic left ventricle, mild LVH, troponin fluids through LVOT consistent with HOCM physiology peak gradient through LVOT approximately 100 mmHg.   -Patient seen in consultation by cardiology medication is being adjusted.   -Appreciate cardiology input and recommendations.     4.  E. coli UTI -Urine cultures with > 100,000 colonies of E. coli.   -Urine culture resistant to ciprofloxacin otherwise pansensitive.   -Transition from IV Rocephin to oral Duricef to complete a 5-day course of treatment.    5.  Hypokalemia/hypomagnesemia -Secondary to diuresis.   -Potassium at 3.2 this morning, magnesium at 2.1.   -K-Dur 40 mEq p.o. every 4 hours x 2 doses.   -Repeat labs in the morning.    6.  History of CVA -Continue aspirin for secondary stroke prophylaxis. -Pravachol changed to Lipitor 40 mg daily.     7.  Well-controlled diabetes mellitus type 2 -Hemoglobin A1c 6.6 (03/22/2022 ) -Repeat hemoglobin A1c 6.2 (03/01/2023 ).   -Continue to hold oral hypoglycemic agents.   -SSI.   8.  Tobacco abuse -Tobacco cessation. -Nicotine patch.  9.  Hyperlipidemia -Fasting lipid panel with an LDL of 152. -Unsure of medication compliance. -Pravachol changed to Lipitor.   10. BPH/urinary retention -Foley catheter ordered yesterday however nursing unable to place due to significant resistance.   -Urology consulted and placed Foley catheter on 03/02/2023.  -Continue Flomax 0.8 mg nightly.  11.  History of polysubstance abuse -Continue clonidine detox protocol.   -Polysubstance cessation stressed to patient. -Supportive care.  12.?  Oral thrush -Start nystatin swish and swallow.        DVT prophylaxis: Lovenox Code Status: Full Family Communication: Updated patient and nieces at bedside. Disposition: Remain in ICU,  she is on nitro drip.  Status is: Inpatient Remains inpatient appropriate because: Severity of illness   Consultants:  Cardiology: Dr. Harriet Masson 03/02/2023 Urology: Dr. Lovena Neighbours 03/02/2023  Procedures:  Chest x-ray 03/01/2023 2D echo 03/02/2023 Foley catheter placement per urology 03/02/2023: Dr. Lovena Neighbours  Antimicrobials:  IV Rocephin 03/01/2023>>>>> 03/03/2023 Duricef 03/03/2023 >>> 03/06/2023 Oral nystatin swish and swallow 03/03/2023>>>>   Subjective: Patient laying in bed, more alert today.  Niece is at bedside.  Denies any chest pain.  Denies any significant shortness of breath.  Denies any abdominal pain.  Overall feeling better than he did.  Stated had a headache yesterday which has since resolved.  Nitro being weaned down.   Objective: Vitals:   03/03/23 0845 03/03/23 0900 03/03/23 0915 03/03/23 0930  BP: (!) 177/58 (!) 139/93 (!) 122/37 (!) 150/51  Pulse: 92 76 69 72  Resp: (!) 24 (!) 21 (!) 30 20  Temp:      TempSrc:      SpO2: 94% 95% 96% 97%  Weight:      Height:        Intake/Output Summary (Last 24 hours) at 03/03/2023 1037 Last data filed at 03/03/2023 0929 Gross per 24 hour  Intake 549.98 ml  Output 1855 ml  Net -1305.02 ml   Filed Weights   03/01/23 1515 03/02/23 0144 03/03/23 0414  Weight: 67.4 kg 68.1 kg 66 kg    Examination:  General exam: Alert.  Following commands.  NAD. Respiratory system: Bibasilar crackles otherwise clear.  No wheezing.  Fair air movement.  Speaking in full sentences. Cardiovascular system: RRR  no murmurs rubs or gallops.  No JVD.  No lower extremity edema.  Gastrointestinal system: Abdomen is soft, nontender, nondistended, positive bowel sounds.  No rebound.  No guarding.  Central nervous system: Alert.  Moving extremities spontaneously.  No focal neurological deficits.     Extremities: Symmetric 5 x 5 power. Skin: No rashes, lesions or ulcers Psychiatry: Judgement and insight appear fair. Mood & affect appropriate.     Data Reviewed: I have  personally reviewed following labs and imaging studies  CBC: Recent Labs  Lab 03/01/23 0657 03/02/23 0801 03/03/23 0749  WBC 9.5 13.6* 13.8*  NEUTROABS 5.7 10.5* 10.4*  HGB 12.9* 11.8* 11.6*  HCT 42.0 37.7* 37.6*  MCV 72.2* 71.3* 73.2*  PLT 263 293 [id number]    Basic Metabolic Panel: Recent Labs  Lab 03/01/23 0657 03/01/23 1233 03/02/23 0801 03/03/23 0749  NA 140  --  141 140  K 3.2*  --  2.8* 3.2*  CL 110  --  107 107  CO2 23  --  22 23  GLUCOSE 108*  --  139* 136*  BUN 19  --  22 25*  CREATININE 0.80  --  0.99 1.18  CALCIUM 8.9  --  9.0 8.9  MG  --  1.5* 2.2 2.1  PHOS  --   --   --  3.3    GFR: Estimated Creatinine Clearance: 54.4 mL/min (by C-G formula based on SCr of 1.18 mg/dL).  Liver Function Tests: Recent Labs  Lab 03/01/23 0657  AST 20  ALT 13  ALKPHOS 60  BILITOT 0.9  PROT 7.9  ALBUMIN 3.7    CBG: Recent Labs  Lab 03/01/23 2110 03/02/23 0802 03/02/23 1139 03/02/23 1603 03/02/23 2251  GLUCAP 168* 147* 169* 127* 154*     Recent Results (from the past 240 hour(s))  Resp panel by RT-PCR (RSV, Flu A&B, Covid) Anterior Nasal Swab     Status: None   Collection Time: 03/01/23  5:38 AM   Specimen: Anterior Nasal Swab  Result Value Ref Range Status   SARS Coronavirus 2 by RT PCR NEGATIVE NEGATIVE Final    Comment: (NOTE) SARS-CoV-2 target nucleic acids are NOT DETECTED.  The SARS-CoV-2 RNA is generally detectable in upper respiratory specimens during the acute phase of infection. The lowest concentration of SARS-CoV-2 viral copies this assay can detect is 138 copies/mL. A negative result does not preclude SARS-Cov-2 infection and should not be used as the sole basis for treatment or other patient management decisions. A negative result may occur with  improper specimen collection/handling, submission of specimen other than nasopharyngeal swab, presence of viral mutation(s) within the areas targeted by this assay, and inadequate number of  viral copies(<138 copies/mL). A negative result must be combined with clinical observations, patient history, and epidemiological information. The expected result is Negative.  Fact Sheet for Patients:  EntrepreneurPulse.com.au  Fact Sheet for Healthcare Providers:  IncredibleEmployment.be  This test is no t yet approved or cleared by the [place] FDA and  has been authorized for detection and/or diagnosis of SARS-CoV-2 by FDA under an Emergency Use Authorization (EUA). This EUA will remain  in effect (meaning this test can be used) for the duration of the COVID-19 declaration under Section 564(b)(1) of the Act, 21 U.S.C.section 360bbb-3(b)(1), unless the authorization is terminated  or revoked sooner.       Influenza A by PCR NEGATIVE NEGATIVE Final   Influenza B by PCR NEGATIVE NEGATIVE Final    Comment: (NOTE) The Xpert Xpress  SARS-CoV-2/FLU/RSV plus assay is intended as an aid in the diagnosis of influenza from Nasopharyngeal swab specimens and should not be used as a sole basis for treatment. Nasal washings and aspirates are unacceptable for Xpert Xpress SARS-CoV-2/FLU/RSV testing.  Fact Sheet for Patients: EntrepreneurPulse.com.au  Fact Sheet for Healthcare Providers: IncredibleEmployment.be  This test is not yet approved or cleared by the [place] FDA and has been authorized for detection and/or diagnosis of SARS-CoV-2 by FDA under an Emergency Use Authorization (EUA). This EUA will remain in effect (meaning this test can be used) for the duration of the COVID-19 declaration under Section 564(b)(1) of the Act, 21 U.S.C. section 360bbb-3(b)(1), unless the authorization is terminated or revoked.     Resp Syncytial Virus by PCR NEGATIVE NEGATIVE Final    Comment: (NOTE) Fact Sheet for Patients: EntrepreneurPulse.com.au  Fact Sheet for Healthcare  Providers: IncredibleEmployment.be  This test is not yet approved or cleared by the [place] FDA and has been authorized for detection and/or diagnosis of SARS-CoV-2 by FDA under an Emergency Use Authorization (EUA). This EUA will remain in effect (meaning this test can be used) for the duration of the COVID-19 declaration under Section 564(b)(1) of the Act, 21 U.S.C. section 360bbb-3(b)(1), unless the authorization is terminated or revoked.  Performed at [facility], [place] [address], [place], [place] [address]   Urine Culture     Status: Abnormal   Collection Time: 03/01/23 11:54 AM   Specimen: Urine, Random  Result Value Ref Range Status   Specimen Description   Final    URINE, RANDOM Performed at [facility] [address] [place], [place] [address]    Special Requests   Final    NONE Performed at [facility], [place] [address] [place], [place] [address]    Culture >=100,000 COLONIES/mL ESCHERICHIA COLI (A)  Final   Report Status 03/03/2023 FINAL  Final   Organism ID, Bacteria ESCHERICHIA COLI (A)  Final      Susceptibility   Escherichia coli - MIC*    AMPICILLIN 8 SENSITIVE Sensitive     CEFAZOLIN <=4 SENSITIVE Sensitive     CEFEPIME <=0.12 SENSITIVE Sensitive     CEFTRIAXONE <=0.25 SENSITIVE Sensitive     CIPROFLOXACIN >=4 RESISTANT Resistant     GENTAMICIN <=1 SENSITIVE Sensitive     IMIPENEM <=0.25 SENSITIVE Sensitive     NITROFURANTOIN <=16 SENSITIVE Sensitive     TRIMETH/SULFA <=20 SENSITIVE Sensitive     AMPICILLIN/SULBACTAM 4 SENSITIVE Sensitive     PIP/TAZO <=4 SENSITIVE Sensitive     * >=100,000 COLONIES/mL ESCHERICHIA COLI  Respiratory (~20 pathogens) panel by PCR     Status: None   Collection Time: 03/01/23  1:01 PM   Specimen: Nasopharyngeal Swab; Respiratory  Result Value Ref Range Status   Adenovirus NOT DETECTED NOT DETECTED Final   Coronavirus 229E NOT DETECTED NOT  DETECTED Final    Comment: (NOTE) The Coronavirus on the Respiratory Panel, DOES NOT test for the novel  Coronavirus (2019 nCoV)    Coronavirus HKU1 NOT DETECTED NOT DETECTED Final   Coronavirus NL63 NOT DETECTED NOT DETECTED Final   Coronavirus OC43 NOT DETECTED NOT DETECTED Final   Metapneumovirus NOT DETECTED NOT DETECTED Final   Rhinovirus / Enterovirus NOT DETECTED NOT DETECTED Final   Influenza A NOT DETECTED NOT DETECTED Final   Influenza B NOT DETECTED NOT DETECTED Final   Parainfluenza Virus 1 NOT DETECTED NOT DETECTED Final   Parainfluenza Virus 2 NOT DETECTED  NOT DETECTED Final   Parainfluenza Virus 3 NOT DETECTED NOT DETECTED Final   Parainfluenza Virus 4 NOT DETECTED NOT DETECTED Final   Respiratory Syncytial Virus NOT DETECTED NOT DETECTED Final   Bordetella pertussis NOT DETECTED NOT DETECTED Final   Bordetella Parapertussis NOT DETECTED NOT DETECTED Final   Chlamydophila pneumoniae NOT DETECTED NOT DETECTED Final   Mycoplasma pneumoniae NOT DETECTED NOT DETECTED Final    Comment: Performed at [facility], [place] [address] [place], [place] [address]  MRSA Next Gen by PCR, Nasal     Status: None   Collection Time: 03/01/23  1:01 PM   Specimen: Nasal Mucosa; Nasal Swab  Result Value Ref Range Status   MRSA by PCR Next Gen NOT DETECTED NOT DETECTED Final    Comment: (NOTE) The GeneXpert MRSA Assay (FDA approved for NASAL specimens only), is one component of a comprehensive MRSA colonization surveillance program. It is not intended to diagnose MRSA infection nor to guide or monitor treatment for MRSA infections. Test performance is not FDA approved in patients less than 86 years old. Performed at [facility], [place] [address] [place], [place] [address]          Radiology Studies: ECHOCARDIOGRAM COMPLETE  Result Date: 03/02/2023    ECHOCARDIOGRAM REPORT   Patient Name:   NICKOLAS DEBSKI Date of Exam: 03/02/2023 Medical Rec #:  [id number]        Height:       69.0 in Accession #:    [id number]      Weight:       150.1 lb Date of Birth:  [birth year]       BSA:          1.829 m Patient Age:    34 years        BP:           139/38 mmHg Patient Gender: M               HR:           95 bpm. Exam Location:  Inpatient Procedure: 2D Echo, Cardiac Doppler and Color Doppler Indications:    [id number] Acute diastolic (congestive) heart failure  History:        Patient has prior history of Echocardiogram examinations. CHF,                 Stroke; Risk Factors:Diabetes and Hypertension.  Sonographer:    Phineas Douglas Referring Phys: [phone] Esmee Fallaw V Gladyes Kudo IMPRESSIONS  1. LV systolic function is vigorous with near cavity obliteration during systole. Turbulent flow through LVOT consistent with HOCM physiology Peak gradient through LVOT approximately 100 mm Hg. Marland Kitchen Left ventricular ejection fraction, by estimation, is 70 to 75%. The left ventricle has hyperdynamic function. There is mild left ventricular hypertrophy. Left ventricular diastolic parameters were normal.  2. Right ventricular systolic function is normal. The right ventricular size is normal.  3. A small pericardial effusion is present.  4. Systolic anterior motion of the anterior mitral leaflet. . Mild mitral valve regurgitation.  5. The aortic valve is tricuspid. Aortic valve regurgitation is mild to moderate. Aortic valve sclerosis/calcification is present, without any evidence of aortic stenosis. FINDINGS  Left Ventricle: LV systolic function is vigorous with near cavity obliteration during systole. Turbulent flow through LVOT consistent with HOCM physiology Peak gradient through LVOT approximately 100 mm Hg. Left ventricular ejection fraction, by estimation, is 70 to 75%. The left ventricle has hyperdynamic function. The left ventricular  internal cavity size was normal in size. There is mild left ventricular hypertrophy. Left ventricular diastolic parameters were normal. Right Ventricle: The right ventricular  size is normal. Right vetricular wall thickness was not assessed. Right ventricular systolic function is normal. Left Atrium: Left atrial size was normal in size. Right Atrium: Right atrial size was normal in size. Pericardium: A small pericardial effusion is present. Mitral Valve: Systolic anterior motion of the anterior mitral leaflet. There is mild thickening of the mitral valve leaflet(s). Mild mitral valve regurgitation. Tricuspid Valve: The tricuspid valve is normal in structure. Tricuspid valve regurgitation is trivial. Aortic Valve: The aortic valve is tricuspid. Aortic valve regurgitation is mild to moderate. Aortic regurgitation PHT measures 587 msec. Aortic valve sclerosis/calcification is present, without any evidence of aortic stenosis. Pulmonic Valve: The pulmonic valve was not well visualized. Pulmonic valve regurgitation is not visualized. Aorta: The aortic root and ascending aorta are structurally normal, with no evidence of dilitation. IAS/Shunts: No atrial level shunt detected by color flow Doppler.  LEFT VENTRICLE PLAX 2D LVIDd:         4.10 cm      Diastology LVIDs:         2.20 cm      LV e' medial:    6.09 cm/s LV PW:         1.10 cm      LV E/e' medial:  8.4 LV IVS:        1.10 cm      LV e' lateral:   6.85 cm/s LVOT diam:     2.00 cm      LV E/e' lateral: 7.5 LVOT Area:     3.14 cm  LV Volumes (MOD) LV vol d, MOD A2C: 87.0 ml LV vol d, MOD A4C: 102.0 ml LV vol s, MOD A2C: 28.7 ml LV vol s, MOD A4C: 28.8 ml LV SV MOD A2C:     58.3 ml LV SV MOD A4C:     102.0 ml LV SV MOD BP:      66.7 ml RIGHT VENTRICLE             IVC RV Basal diam:  3.40 cm     IVC diam: 1.50 cm RV S prime:     16.10 cm/s TAPSE (M-mode): 1.9 cm LEFT ATRIUM             Index        RIGHT ATRIUM           Index LA diam:        3.70 cm 2.02 cm/m   RA Area:     14.80 cm LA Vol (A2C):   47.6 ml 26.03 ml/m  RA Volume:   30.70 ml  16.79 ml/m LA Vol (A4C):   46.3 ml 25.32 ml/m LA Biplane Vol: 49.0 ml 26.79 ml/m  AORTIC VALVE  AI PHT:      587 msec  AORTA Ao Root diam: 3.20 cm Ao Asc diam:  3.00 cm MITRAL VALVE MV Area (PHT): 4.12 cm    SHUNTS MV Decel Time: 184 msec    Systemic Diam: 2.00 cm MV E velocity: 51.40 cm/s MV A velocity: 93.40 cm/s MV E/A ratio:  0.55 Dorris Carnes MD Electronically signed by Dorris Carnes MD Signature Date/Time: 03/02/2023/6:15:00 PM    Final    DG Abd 2 Views  Result Date: 03/01/2023 CLINICAL DATA:  Vomiting and abdominal pain EXAM: ABDOMEN - 2 VIEW COMPARISON:  X-ray 11/03/2014.  CT 08/19/2022 FINDINGS:  Gas is seen in nondilated loops of small and large bowel. Scattered stool. Few air-filled mildly distended loops of small bowel right upper quadrant. Gas and stool in the rectum. No obstruction. No obvious free air beneath the diaphragm on the upright view. Degenerative changes of the spine. There are some presumed vascular calcifications along the pelvis. Overlapping cardiac leads. IMPRESSION: Nonspecific bowel gas pattern. Electronically Signed   By: Jill Side M.D.   On: 03/01/2023 14:47        Scheduled Meds:  aspirin EC  81 mg Oral q morning   atorvastatin  40 mg Oral Daily   carvedilol  25 mg Oral BID WC   cefadroxil  1,000 mg Oral BID   Chlorhexidine Gluconate Cloth  6 each Topical Daily   enoxaparin (LOVENOX) injection  40 mg Subcutaneous Q24H   fluticasone  2 spray Each Nare Daily   guaiFENesin  1,200 mg Oral BID   hydrALAZINE  100 mg Oral Q8H   insulin aspart  0-9 Units Subcutaneous TID WC   loratadine  10 mg Oral Daily   nystatin  5 mL Oral QID   pantoprazole  40 mg Oral Daily   potassium chloride  40 mEq Oral Q4H   sodium chloride flush  3 mL Intravenous Q12H   tamsulosin  0.8 mg Oral QHS   verapamil  180 mg Oral Daily   Continuous Infusions:  sodium chloride Stopped (03/01/23 1613)   nitroGLYCERIN 5 mcg/min (03/03/23 0929)     LOS: 2 days    Time spent: 40 minutes    Irine Seal, MD [facility]   To contact the attending provider between 7A-7P or  the covering provider during after hours 7P-7A, please log into the web site [email or web address] and access using universal [place] password for that web site. If you do not have the password, please call the hospital operator.  03/03/2023, 10:37 AM

## 2023-03-04 ENCOUNTER — Other Ambulatory Visit (HOSPITAL_COMMUNITY): Payer: Medicare HMO

## 2023-03-04 ENCOUNTER — Ambulatory Visit (HOSPITAL_COMMUNITY)
Admission: EM | Admit: 2023-03-04 | Discharge: 2023-03-04 | Disposition: A | Discharge status: Home / Self Care | Payer: Medicare HMO | Source: Home / Self Care | Attending: Cardiology | Admitting: Cardiology

## 2023-03-04 DIAGNOSIS — I5032 Chronic diastolic (congestive) heart failure: Secondary | ICD-10-CM | POA: Diagnosis not present

## 2023-03-04 DIAGNOSIS — I161 Hypertensive emergency: Secondary | ICD-10-CM | POA: Diagnosis not present

## 2023-03-04 DIAGNOSIS — E1169 Type 2 diabetes mellitus with other specified complication: Secondary | ICD-10-CM | POA: Diagnosis present

## 2023-03-04 DIAGNOSIS — F17210 Nicotine dependence, cigarettes, uncomplicated: Secondary | ICD-10-CM

## 2023-03-04 DIAGNOSIS — I351 Nonrheumatic aortic (valve) insufficiency: Secondary | ICD-10-CM

## 2023-03-04 DIAGNOSIS — E782 Mixed hyperlipidemia: Secondary | ICD-10-CM

## 2023-03-04 DIAGNOSIS — E119 Type 2 diabetes mellitus without complications: Secondary | ICD-10-CM | POA: Diagnosis not present

## 2023-03-04 DIAGNOSIS — N390 Urinary tract infection, site not specified: Secondary | ICD-10-CM

## 2023-03-04 DIAGNOSIS — R7989 Other specified abnormal findings of blood chemistry: Secondary | ICD-10-CM | POA: Diagnosis present

## 2023-03-04 DIAGNOSIS — I5033 Acute on chronic diastolic (congestive) heart failure: Secondary | ICD-10-CM | POA: Diagnosis not present

## 2023-03-04 DIAGNOSIS — B9620 Unspecified Escherichia coli [E. coli] as the cause of diseases classified elsewhere: Secondary | ICD-10-CM

## 2023-03-04 DIAGNOSIS — N401 Enlarged prostate with lower urinary tract symptoms: Secondary | ICD-10-CM

## 2023-03-04 LAB — CBC WITH DIFFERENTIAL/PLATELET
Abs Immature Granulocytes: 0.05 10*3/uL (ref 0.00–0.07)
Basophils Absolute: 0.1 10*3/uL (ref 0.0–0.1)
Basophils Relative: 0 %
Eosinophils Absolute: 0.1 10*3/uL (ref 0.0–0.5)
Eosinophils Relative: 1 %
HCT: 42.1 % (ref 39.0–52.0)
Hemoglobin: 12.6 g/dL — ABNORMAL LOW (ref 13.0–17.0)
Immature Granulocytes: 0 %
Lymphocytes Relative: 23 %
Lymphs Abs: 3.5 10*3/uL (ref 0.7–4.0)
MCH: 22.2 pg — ABNORMAL LOW (ref 26.0–34.0)
MCHC: 29.9 g/dL — ABNORMAL LOW (ref 30.0–36.0)
MCV: 74.3 fL — ABNORMAL LOW (ref 80.0–100.0)
Monocytes Absolute: 1 10*3/uL (ref 0.1–1.0)
Monocytes Relative: 7 %
Neutro Abs: 10.1 10*3/uL — ABNORMAL HIGH (ref 1.7–7.7)
Neutrophils Relative %: 69 %
Platelets: 286 10*3/uL (ref 150–400)
RBC: 5.67 MIL/uL (ref 4.22–5.81)
RDW: 15.4 % (ref 11.5–15.5)
WBC: 14.8 10*3/uL — ABNORMAL HIGH (ref 4.0–10.5)
nRBC: 0 % (ref 0.0–0.2)

## 2023-03-04 LAB — BASIC METABOLIC PANEL
Anion gap: 9 (ref 5–15)
BUN: 29 mg/dL — ABNORMAL HIGH (ref 8–23)
CO2: 22 mmol/L (ref 22–32)
Calcium: 9.3 mg/dL (ref 8.9–10.3)
Chloride: 110 mmol/L (ref 98–111)
Creatinine, Ser: 1.13 mg/dL (ref 0.61–1.24)
GFR, Estimated: >60 mL/min (ref >60)
Glucose, Bld: 147 mg/dL — ABNORMAL HIGH (ref 70–99)
Potassium: 4 mmol/L (ref 3.5–5.1)
Sodium: 141 mmol/L (ref 135–145)

## 2023-03-04 LAB — GLUCOSE, CAPILLARY
Glucose-Capillary: 140 mg/dL — ABNORMAL HIGH (ref 70–99)
Glucose-Capillary: 161 mg/dL — ABNORMAL HIGH (ref 70–99)
Glucose-Capillary: 226 mg/dL — ABNORMAL HIGH (ref 70–99)
Glucose-Capillary: 102 mg/dL — ABNORMAL HIGH (ref 70–99)

## 2023-03-04 LAB — MAGNESIUM: Magnesium: 2 mg/dL (ref 1.7–2.4)

## 2023-03-04 MED ORDER — DIAZEPAM 5 MG PO TABS
5.0000 mg | ORAL_TABLET | Freq: Once | ORAL | Status: AC
  Administered 2023-03-04: 5 mg via ORAL
  Filled 2023-03-04: qty 1

## 2023-03-04 MED ORDER — GADOBUTROL 1 MMOL/ML IV SOLN
7.0000 mL | Freq: Once | INTRAVENOUS | Status: AC | PRN
  Administered 2023-03-04: 7 mL via INTRAVENOUS

## 2023-03-04 MED ORDER — VERAPAMIL HCL ER 240 MG PO TBCR
240.0000 mg | EXTENDED_RELEASE_TABLET | Freq: Every day | ORAL | Status: DC
  Administered 2023-03-05: 240 mg via ORAL
  Filled 2023-03-04: qty 1

## 2023-03-04 NOTE — Progress Notes (Addendum)
Rounding Note    Patient Name: [name] Date of Encounter: 03/04/2023  [place] Cardiologist: None   Subjective   Patient is feeling well this AM, but has a little bit of nausea. No chest pain, palpitations, shortness of breath, dizziness, syncope, near syncope.   Inpatient Medications    Scheduled Meds:  aspirin EC  81 mg Oral q morning   atorvastatin  40 mg Oral Daily   carvedilol  25 mg Oral BID WC   cefadroxil  1,000 mg Oral BID   Chlorhexidine Gluconate Cloth  6 each Topical Daily   enoxaparin (LOVENOX) injection  40 mg Subcutaneous Q24H   fluticasone  2 spray Each Nare Daily   guaiFENesin  1,200 mg Oral BID   hydrALAZINE  100 mg Oral Q8H   insulin aspart  0-9 Units Subcutaneous TID WC   loratadine  10 mg Oral Daily   nystatin  5 mL Oral QID   pantoprazole  40 mg Oral Daily   sodium chloride flush  3 mL Intravenous Q12H   tamsulosin  0.8 mg Oral QHS   verapamil  180 mg Oral Daily   Continuous Infusions:  sodium chloride Stopped (03/01/23 1613)   nitroGLYCERIN Stopped (03/03/23 1050)   PRN Meds: sodium chloride, acetaminophen, diclofenac Sodium, dicyclomine, hydrOXYzine, influenza vaccine adjuvanted, loperamide, methocarbamol, morphine injection, naproxen, nicotine, ondansetron (ZOFRAN) IV, ondansetron, mouth rinse, pneumococcal 20-valent conjugate vaccine, sodium chloride flush   Vital Signs    Vitals:   03/04/23 0400 03/04/23 0623 03/04/23 0712 03/04/23 0731  BP:  (!) 194/59  (!) 180/46  Pulse:   71 67  Resp:   (!) 23 15  Temp: 98.9 F (37.2 C)     TempSrc: Oral     SpO2:   95% 97%  Weight:      Height:        Intake/Output Summary (Last 24 hours) at 03/04/2023 0748 Last data filed at 03/04/2023 0551 Gross per 24 hour  Intake 486.12 ml  Output 595 ml  Net -108.88 ml      03/03/2023    4:14 AM 03/02/2023    1:44 AM 03/01/2023    3:15 PM  Last 3 Weights  Weight (lbs) 145 lb 8.1 oz 150 lb 2.1 oz 148 lb 9.4 oz  Weight (kg) 66 kg 68.1  kg 67.4 kg      Telemetry    Normal sinus rhythm, HR in the 60s-70s - Personally Reviewed  ECG    No new tracings since 3/4 - Personally Reviewed  Physical Exam   GEN: No acute distress. Sitting upright in the recliner eating breakfast    Neck: No JVD Cardiac: RRR, grade 2/6 systolic murmur throughout  Respiratory: Clear to auscultation bilaterally. Normal WOB on room air  GI: Soft, nontender, non-distended  MS: No edema in BLE; No deformity. Neuro:  Nonfocal  Psych: Normal affect   Labs    High Sensitivity Troponin:   Recent Labs  Lab 03/01/23 1233 03/01/23 1345 03/01/23 1827 03/02/23 0801  TROPONINIHS 39* 326* 292* 191*     Chemistry Recent Labs  Lab 03/01/23 0657 03/01/23 1233 03/02/23 0801 03/03/23 0749 03/04/23 0250  NA 140  --  141 140 141  K 3.2*  --  2.8* 3.2* 4.0  CL 110  --  107 107 110  CO2 23  --  '22 23 22  '$ GLUCOSE 108*  --  139* 136* 147*  BUN 19  --  22 25* 29*  CREATININE  0.80  --  0.99 1.18 1.13  CALCIUM 8.9  --  9.0 8.9 9.3  MG  --    < > 2.2 2.1 2.0  PROT 7.9  --   --   --   --   ALBUMIN 3.7  --   --   --   --   AST 20  --   --   --   --   ALT 13  --   --   --   --   ALKPHOS 60  --   --   --   --   BILITOT 0.9  --   --   --   --   GFRNONAA >60  --  >60 >60 >60  ANIONGAP 7  --  '12 10 9   '$ < > = values in this interval not displayed.    Lipids  Recent Labs  Lab 03/02/23 0801  CHOL 207*  TRIG 98  HDL 35*  LDLCALC 152*  CHOLHDL 5.9    Hematology Recent Labs  Lab 03/02/23 0801 03/03/23 0749 03/04/23 0250  WBC 13.6* 13.8* 14.8*  RBC 5.29 5.14 5.67  HGB 11.8* 11.6* 12.6*  HCT 37.7* 37.6* 42.1  MCV 71.3* 73.2* 74.3*  MCH 22.3* 22.6* 22.2*  MCHC 31.3 30.9 29.9*  RDW 15.1 15.2 15.4  PLT 293 310 286   Thyroid No results for input(s): "TSH", "FREET4" in the last 168 hours.  BNP Recent Labs  Lab 03/01/23 0657 03/02/23 0801  BNP 894.6* 764.5*    DDimer No results for input(s): "DDIMER" in the last 168 hours.    Radiology    ECHOCARDIOGRAM COMPLETE  Result Date: 03/02/2023    ECHOCARDIOGRAM REPORT   Patient Name:   [name] Date of Exam: 03/02/2023 Medical Rec #:  [id number]       Height:       69.0 in Accession #:    [id number]      Weight:       150.1 lb Date of Birth:  October 28, 1951       BSA:          1.829 m Patient Age:    72 years        BP:           139/38 mmHg Patient Gender: M               HR:           95 bpm. Exam Location:  Inpatient Procedure: 2D Echo, Cardiac Doppler and Color Doppler Indications:    [id number] Acute diastolic (congestive) heart failure  History:        Patient has prior history of Echocardiogram examinations. CHF,                 Stroke; Risk Factors:Diabetes and Hypertension.  Sonographer:    Phineas Douglas Referring Phys: [phone] DANIEL V THOMPSON IMPRESSIONS  1. LV systolic function is vigorous with near cavity obliteration during systole. Turbulent flow through LVOT consistent with HOCM physiology Peak gradient through LVOT approximately 100 mm Hg. Marland Kitchen Left ventricular ejection fraction, by estimation, is 70 to 75%. The left ventricle has hyperdynamic function. There is mild left ventricular hypertrophy. Left ventricular diastolic parameters were normal.  2. Right ventricular systolic function is normal. The right ventricular size is normal.  3. A small pericardial effusion is present.  4. Systolic anterior motion of the anterior mitral leaflet. . Mild mitral valve regurgitation.  5. The aortic valve is  tricuspid. Aortic valve regurgitation is mild to moderate. Aortic valve sclerosis/calcification is present, without any evidence of aortic stenosis. FINDINGS  Left Ventricle: LV systolic function is vigorous with near cavity obliteration during systole. Turbulent flow through LVOT consistent with HOCM physiology Peak gradient through LVOT approximately 100 mm Hg. Left ventricular ejection fraction, by estimation, is 70 to 75%. The left ventricle has hyperdynamic function. The left  ventricular internal cavity size was normal in size. There is mild left ventricular hypertrophy. Left ventricular diastolic parameters were normal. Right Ventricle: The right ventricular size is normal. Right vetricular wall thickness was not assessed. Right ventricular systolic function is normal. Left Atrium: Left atrial size was normal in size. Right Atrium: Right atrial size was normal in size. Pericardium: A small pericardial effusion is present. Mitral Valve: Systolic anterior motion of the anterior mitral leaflet. There is mild thickening of the mitral valve leaflet(s). Mild mitral valve regurgitation. Tricuspid Valve: The tricuspid valve is normal in structure. Tricuspid valve regurgitation is trivial. Aortic Valve: The aortic valve is tricuspid. Aortic valve regurgitation is mild to moderate. Aortic regurgitation PHT measures 587 msec. Aortic valve sclerosis/calcification is present, without any evidence of aortic stenosis. Pulmonic Valve: The pulmonic valve was not well visualized. Pulmonic valve regurgitation is not visualized. Aorta: The aortic root and ascending aorta are structurally normal, with no evidence of dilitation. IAS/Shunts: No atrial level shunt detected by color flow Doppler.  LEFT VENTRICLE PLAX 2D LVIDd:         4.10 cm      Diastology LVIDs:         2.20 cm      LV e' medial:    6.09 cm/s LV PW:         1.10 cm      LV E/e' medial:  8.4 LV IVS:        1.10 cm      LV e' lateral:   6.85 cm/s LVOT diam:     2.00 cm      LV E/e' lateral: 7.5 LVOT Area:     3.14 cm  LV Volumes (MOD) LV vol d, MOD A2C: 87.0 ml LV vol d, MOD A4C: 102.0 ml LV vol s, MOD A2C: 28.7 ml LV vol s, MOD A4C: 28.8 ml LV SV MOD A2C:     58.3 ml LV SV MOD A4C:     102.0 ml LV SV MOD BP:      66.7 ml RIGHT VENTRICLE             IVC RV Basal diam:  3.40 cm     IVC diam: 1.50 cm RV S prime:     16.10 cm/s TAPSE (M-mode): 1.9 cm LEFT ATRIUM             Index        RIGHT ATRIUM           Index LA diam:        3.70 cm 2.02  cm/m   RA Area:     14.80 cm LA Vol (A2C):   47.6 ml 26.03 ml/m  RA Volume:   30.70 ml  16.79 ml/m LA Vol (A4C):   46.3 ml 25.32 ml/m LA Biplane Vol: 49.0 ml 26.79 ml/m  AORTIC VALVE AI PHT:      587 msec  AORTA Ao Root diam: 3.20 cm Ao Asc diam:  3.00 cm MITRAL VALVE MV Area (PHT): 4.12 cm    SHUNTS MV Decel Time: 184 msec  Systemic Diam: 2.00 cm MV E velocity: 51.40 cm/s MV A velocity: 93.40 cm/s MV E/A ratio:  0.55 Dorris Carnes MD Electronically signed by Dorris Carnes MD Signature Date/Time: 03/02/2023/6:15:00 PM    Final     Cardiac Studies   Echocardiogram 03/02/23 1. LV systolic function is vigorous with near cavity obliteration during  systole. Turbulent flow through LVOT consistent with HOCM physiology Peak  gradient through LVOT approximately 100 mm Hg. Marland Kitchen Left ventricular ejection  fraction, by estimation, is 70  to 75%. The left ventricle has hyperdynamic function. There is mild left  ventricular hypertrophy. Left ventricular diastolic parameters were  normal.   2. Right ventricular systolic function is normal. The right ventricular  size is normal.   3. A small pericardial effusion is present.   4. Systolic anterior motion of the anterior mitral leaflet. . Mild mitral  valve regurgitation.   5. The aortic valve is tricuspid. Aortic valve regurgitation is mild to  moderate. Aortic valve sclerosis/calcification is present, without any  evidence of aortic stenosis.   Patient Profile     72 y.o. male with a hx of HOCM, chronic diastolic heart failure, history of alcohol dependence, history of brain tumor, chronic back pain, chronic headaches, history of CVA, type 2 DM, HTN, history of narcotic abuse, ongoing tobacco use who is being seen 03/02/2023 for the evaluation of acute on chronic diastolic heart failure, HTN emergency   Assessment & Plan    Acute on chronic diastolic heart failure HOCM  - Patient presented complaining of 2 days of upper respiratory-like symptoms including  nasal congestion, cough, shortness of breath.  COVID, flu, RSV negative.  BNP elevated to 894.6.  Chest x-ray with increased cardiopericardial silhouette concerning for increasing cardiomegaly, pulmonary vascular congestion  - Echocardiogram this admission showed vigorous LV systolic function with near cavity obliteration during systole, turbulent flow through LVOT consistent with HOCM physiology. Peak gradient through LVOT 133mHg  - Patient scheduled for cardiac MRI today at noon- has medications ordered for anxiety/claustrophobia  - Hold lasix to prevent intravascular volume depletion with HOCM. He is euvolemic on exam  - Continue carvedilol 25 mg BID and verapamil 180 mg daily -- per telemetry, HR is in the 60s-70s. No signs of PR interval lengthening or heart block on telemetry  - no SGLT2i given history of frequent UTIs, risk of intravascular volume depletion with HOCM  - Discussed genetic testing for first degree relatives. Also discussed symptoms of HOCM, risk of sudden cardiac death. Encouraged hydration  - If patient is agreeable/consistent with cardiology follow up, consider [facility]    Hypertensive emergency - Patient was significantly hypertensive in the ED with blood pressure getting as high as 248/58 - Now on carvedilol 25 mg BID, verapamil 180 mg daily, hydralazine 100 mg TID. Off IV nitro. BP this am 151/45  - Follow BP after AM medications today    Elevated troponins -High sensitive troponin [phone]- Suspect this is demand ischemia/type 2 MI in the setting of hypertensive emergency. Patient denies chest pain  - Echocardiogram with evidence of HOCM   Otherwise per primary  - Possible UTI - Hypokalemia/hypomagnesemia -- supplemented  - History of CVA  - Type 2 DM   For questions or updates, please contact CDevinePlease consult [email or web address] for contact info under    Signed, KMargie Billet  PA-C  03/04/2023, 7:48 AM    Patient seen and examined, note reviewed with  the signed Energy manager. I personally reviewed laboratory data, imaging studies and relevant notes. I independently examined the patient and formulated the important aspects of the plan. I have personally discussed the plan with the patient and/or family. Comments or changes to the note/plan are indicated below.  He appears to be in good spirits today - sister and niece by the bedside. Plan for cardiac MR today  Tolerating new medication regimen. Please keep off lasix as he is now euovlemic Cont coreg at 25 mg BID, increase verapamil to 240 mg daily. Continue hydralazine at 100 mg TID.  He will need follow up with our HOCM clinic DM is being managed by the primary team   Berniece Salines DO, [place] Attending Cardiologist [facility]  [address] [place], [place] [address] [phone] Website: [email or web address]

## 2023-03-04 NOTE — Assessment & Plan Note (Addendum)
Blood pressure is improving with slow titration of multiple antihypertensives  This includes Coreg 25 mg twice daily, hydralazine 100 mg 3 times daily and verapamil which is being increased to 240 mg daily today. Acute congestive heart failure and markedly elevated troponins all seem to be improving with improving blood pressures. Intravenous nitroglycerin has been weaned off since 3/5 Cardiology following, their assistance is appreciated.

## 2023-03-04 NOTE — Hospital Course (Addendum)
72 y.o. male with medical history significant of diastolic congestive heart failure and HOCM (Echo 03/02/2023 EF 70-75%), prior history of alcohol dependence, history of brain tumor, history of chronic diastolic CHF, chronic back pain, chronic headaches, history of CVA, diabetes mellitus type 2, hypertension, prior history of narcotic abuse, ongoing tobacco use presenting to the ED with a 2-day history of shortness of breath.    Upon evaluation in the emergency department, patient noted on presentation to be in hypertensive emergency with systolic blood pressure as high as 248/58.  Patient was placed on a nitroglycerin drip in the ED. the hospitalist group was called to assess the patient for admission to the hospital and the patient was admitted to the stepdown unit.  Clinically, patient was felt to be suffering from multiple sequela of hypertensive emergency including evidence of acute congestive heart failure and a markedly elevated troponin.  Dr. Harriet Masson with cardiology was consulted and with their assistance patient was slowly transitioned off of nitroglycerin infusion while multiple oral antihypertensives were initiated.  Echocardiogram was performed revealing evidence of HOCM.  Patient underwent cardiac MRI on 3/6.  Early in the hospitalization the patient also exhibited evidence of urinary retention.  After multiple unsuccessful attempts by care staff to place Foley catheter urology was consulted and a Foley catheter was successfully placed.  This was thought to be secondary to patient's history of BPH.  Patient was placed on 0.8 mg of Flomax daily.  Symptomatically patient improved with resolution of his shortness of breath.  Patient was placed on a regimen of Coreg 25 mg twice daily, hydralazine 100 mg 3 times daily and verapamil 360 mg daily prior to discharge which resulted in much improved blood pressures.  Arranges were made for the patient be discharged home in improved and stable condition on  03/05/2023 with close outpatient follow-up with Cardiology.

## 2023-03-04 NOTE — Assessment & Plan Note (Signed)
Replaced 

## 2023-03-04 NOTE — Assessment & Plan Note (Signed)
E. coli based on urine cultures performed 3/3 Patient received 2 days of ceftriaxone and is currently on cefadroxil to complete a 5-day course of therapy

## 2023-03-04 NOTE — Assessment & Plan Note (Signed)
Continue home regimen of Lipitor

## 2023-03-04 NOTE — Assessment & Plan Note (Signed)
Clinically patient is substantially improved Patient undergoing cardiac MRI today Patient now appearing euvolemic, intravenous diuretics have since been discontinued Titrating antihypertensives as above Patient suffers from HOCM, limiting our ability to aggressively diurese Patient to follow-up with HOCM clinic which Dr. Glenford Bayley after discharge Cardiology following their assistance is appreciated

## 2023-03-04 NOTE — Assessment & Plan Note (Signed)
Thought to be secondary to demand ischemia in the setting of hypertensive emergency Troponin is trending with downtrending blood pressures Chest pain-free Monitoring on telemetry

## 2023-03-04 NOTE — Assessment & Plan Note (Signed)
Patient been placed on Accu-Cheks before every meal and nightly with sliding scale insulin Holding home regimen of hypoglycemics Diabetic Diet

## 2023-03-04 NOTE — Progress Notes (Signed)
Patient picked up by [facility], on room air, vital signs stable. Report given to Elmyra Ricks, Therapist, sports.

## 2023-03-04 NOTE — Progress Notes (Signed)
PROGRESS NOTE   [name]  [id number] DOB: February 06, 1951 DOA: 03/01/2023 PCP: Nolene Ebbs, MD   Date of Service: the patient was seen and examined on 03/04/2023  Brief Narrative:  72 y.o. male with medical history significant of diastolic congestive heart failure and HOCM (Echo 03/02/2023 EF 70-75%), prior history of alcohol dependence, history of brain tumor, history of chronic diastolic CHF, chronic back pain, chronic headaches, history of CVA, diabetes mellitus type 2, hypertension, prior history of narcotic abuse, ongoing tobacco use presenting to the ED with a 2-day history of shortness of breath.    Upon evaluation in the emergency department, patient noted on presentation to be in hypertensive emergency with systolic blood pressure as high as 248/58.  Patient was placed on a nitroglycerin drip in the ED. the hospitalist group was called to assess the patient for admission to the hospital and the patient was admitted to the stepdown unit.  Clinically, patient was felt to be suffering from multiple sequela of hypertensive emergency including evidence of acute congestive heart failure and a markedly elevated troponin.  Dr. Harriet Masson with cardiology was consulted and with their assistance patient was slowly transitioned off of nitroglycerin infusion while multiple oral antihypertensives were initiated.  Echocardiogram was performed revealing evidence of HOCM.  Patient underwent cardiac MRI on 3/6.  Early in the hospitalization the patient also exhibited evidence of urinary retention.  After multiple unsuccessful attempts by care staff to place Foley catheter urology was consulted and a Foley catheter was successfully placed.  This was thought to be secondary to patient's history of BPH.  Patient was placed on 0.8 mg of Flomax daily.     Assessment and Plan: * Hypertensive emergency Blood pressure is improving with slow titration of multiple antihypertensives  This includes Coreg 25 mg  twice daily, hydralazine 100 mg 3 times daily and verapamil which is being increased to 240 mg daily today. Acute congestive heart failure and markedly elevated troponins all seem to be improving with improving blood pressures. Intravenous nitroglycerin has been weaned off since 3/5 Cardiology following, their assistance is appreciated.  Acute on chronic diastolic CHF (congestive heart failure) (HCC) Clinically patient is substantially improved Patient undergoing cardiac MRI today Patient now appearing euvolemic, intravenous diuretics have since been discontinued Titrating antihypertensives as above Patient suffers from HOCM, limiting our ability to aggressively diurese Patient to follow-up with HOCM clinic which Dr. Glenford Bayley after discharge Cardiology following their assistance is appreciated  Elevated troponin level not due myocardial infarction Thought to be secondary to demand ischemia in the setting of hypertensive emergency Troponin is trending with downtrending blood pressures Chest pain-free Monitoring on telemetry  E-coli UTI E. coli based on urine cultures performed 3/3 Patient received 2 days of ceftriaxone and is currently on cefadroxil to complete a 5-day course of therapy   Benign prostatic hyperplasia with urinary retention Known history of BPH Early in the hospitalization patient developed urinary retention.  Care team was unable to place Foley catheter and therefore urology was consulted with Foley catheter placement Patient is now receiving 0.8 mg of tamsulosin daily Patient may require outpatient urology follow-up  Type 2 diabetes mellitus without complication, without long-term current use of insulin ([place]) Patient been placed on Accu-Cheks before every meal and nightly with sliding scale insulin Holding home regimen of hypoglycemics Diabetic Diet   Mixed hyperlipidemia due to type 2 diabetes mellitus ([place]) Continue home regimen of  Lipitor  Hypokalemia Replaced  Hypomagnesemia Replaced  Nicotine dependence, cigarettes, uncomplicated Counseled on  cessation daily Providing with nicotine patches  Oropharyngeal candidiasis Currently on nystatin        Subjective:  Patient denies chest pain or shortness of breath which she states are improved.  Physical Exam:  Vitals:   03/04/23 1600 03/04/23 1700 03/04/23 1810 03/04/23 1900  BP: (!) 168/27 (!) 179/45 (!) 180/40 (!) 174/43  Pulse: 68 71 67 66  Resp: (!) 27 (!) 22 15 (!) 24  Temp: 98.1 F (36.7 C)     TempSrc: Oral     SpO2: 96% 97% 95% 95%  Weight:      Height:        Constitutional: Awake alert and oriented x3, no associated distress.   Skin: no rashes, no lesions, good skin turgor noted. Eyes: Pupils are equally reactive to light.  No evidence of scleral icterus or conjunctival pallor.  ENMT: Moist mucous membranes noted.  Posterior pharynx clear of any exudate or lesions.   Respiratory: clear to auscultation bilaterally, no wheezing, no crackles. Normal respiratory effort. No accessory muscle use.  Cardiovascular: Regular rate and rhythm, no murmurs / rubs / gallops. No extremity edema. 2+ pedal pulses. No carotid bruits.  Abdomen: Abdomen is soft and nontender.  No evidence of intra-abdominal masses.  Positive bowel sounds noted in all quadrants.   Musculoskeletal: No joint deformity upper and lower extremities. Good ROM, no contractures. Normal muscle tone.  GU Foley catheter in place   Data Reviewed:  I have personally reviewed and interpreted labs, imaging.  Significant findings are   CBC: Recent Labs  Lab 03/01/23 0657 03/02/23 0801 03/03/23 0749 03/04/23 0250  WBC 9.5 13.6* 13.8* 14.8*  NEUTROABS 5.7 10.5* 10.4* 10.1*  HGB 12.9* 11.8* 11.6* 12.6*  HCT 42.0 37.7* 37.6* 42.1  MCV 72.2* 71.3* 73.2* 74.3*  PLT 263 293 310 [id number]   Basic Metabolic Panel: Recent Labs  Lab 03/01/23 0657 03/01/23 1233 03/02/23 0801  03/03/23 0749 03/04/23 0250  NA 140  --  141 140 141  K 3.2*  --  2.8* 3.2* 4.0  CL 110  --  107 107 110  CO2 23  --  '22 23 22  '$ GLUCOSE 108*  --  139* 136* 147*  BUN 19  --  22 25* 29*  CREATININE 0.80  --  0.99 1.18 1.13  CALCIUM 8.9  --  9.0 8.9 9.3  MG  --  1.5* 2.2 2.1 2.0  PHOS  --   --   --  3.3  --    GFR: Estimated Creatinine Clearance: 56.8 mL/min (by C-G formula based on SCr of 1.13 mg/dL). Liver Function Tests: Recent Labs  Lab 03/01/23 0657  AST 20  ALT 13  ALKPHOS 60  BILITOT 0.9  PROT 7.9  ALBUMIN 3.7   Code Status:  Full code.  Code status   Severity of Illness:  The appropriate patient status for this patient is INPATIENT. Inpatient status is judged to be reasonable and necessary in order to provide the required intensity of service to ensure the patient's safety. The patient's presenting symptoms, physical exam findings, and initial radiographic and laboratory data in the context of their chronic comorbidities is felt to place them at high risk for further clinical deterioration. Furthermore, it is not anticipated that the patient will be medically stable for discharge from the hospital within 2 midnights of admission.   * I certify that at the point of admission it is my clinical judgment that the patient will require inpatient hospital care spanning beyond  2 midnights from the point of admission due to high intensity of service, high risk for further deterioration and high frequency of surveillance required.*  Time spent:  54 minutes  Author:  Vernelle Emerald MD  03/04/2023 8:05 PM

## 2023-03-04 NOTE — Assessment & Plan Note (Signed)
Counseled on cessation daily Providing with nicotine patches

## 2023-03-04 NOTE — Assessment & Plan Note (Signed)
Known history of BPH Early in the hospitalization patient developed urinary retention.  Care team was unable to place Foley catheter and therefore urology was consulted with Foley catheter placement Patient is now receiving 0.8 mg of tamsulosin daily Patient may require outpatient urology follow-up

## 2023-03-04 NOTE — Assessment & Plan Note (Signed)
Currently on nystatin

## 2023-03-05 DIAGNOSIS — E119 Type 2 diabetes mellitus without complications: Secondary | ICD-10-CM | POA: Diagnosis not present

## 2023-03-05 DIAGNOSIS — I5032 Chronic diastolic (congestive) heart failure: Secondary | ICD-10-CM | POA: Diagnosis not present

## 2023-03-05 DIAGNOSIS — I161 Hypertensive emergency: Secondary | ICD-10-CM | POA: Diagnosis not present

## 2023-03-05 DIAGNOSIS — I5033 Acute on chronic diastolic (congestive) heart failure: Secondary | ICD-10-CM | POA: Diagnosis not present

## 2023-03-05 LAB — COMPREHENSIVE METABOLIC PANEL
ALT: 23 U/L (ref 0–44)
AST: 37 U/L (ref 15–41)
Albumin: 3.6 g/dL (ref 3.5–5.0)
Alkaline Phosphatase: 45 U/L (ref 38–126)
Anion gap: 8 (ref 5–15)
BUN: 27 mg/dL — ABNORMAL HIGH (ref 8–23)
CO2: 23 mmol/L (ref 22–32)
Calcium: 8.9 mg/dL (ref 8.9–10.3)
Chloride: 107 mmol/L (ref 98–111)
Creatinine, Ser: 1.1 mg/dL (ref 0.61–1.24)
GFR, Estimated: >60 mL/min (ref >60)
Glucose, Bld: 131 mg/dL — ABNORMAL HIGH (ref 70–99)
Potassium: 3.6 mmol/L (ref 3.5–5.1)
Sodium: 138 mmol/L (ref 135–145)
Total Bilirubin: 0.8 mg/dL (ref 0.3–1.2)
Total Protein: 7 g/dL (ref 6.5–8.1)

## 2023-03-05 LAB — MAGNESIUM: Magnesium: 2.2 mg/dL (ref 1.7–2.4)

## 2023-03-05 LAB — CBC WITH DIFFERENTIAL/PLATELET
Abs Immature Granulocytes: 0.04 10*3/uL (ref 0.00–0.07)
Basophils Absolute: 0.1 10*3/uL (ref 0.0–0.1)
Basophils Relative: 1 %
Eosinophils Absolute: 0.4 10*3/uL (ref 0.0–0.5)
Eosinophils Relative: 3 %
HCT: 39.8 % (ref 39.0–52.0)
Hemoglobin: 12.2 g/dL — ABNORMAL LOW (ref 13.0–17.0)
Immature Granulocytes: 0 %
Lymphocytes Relative: 30 %
Lymphs Abs: 3.6 10*3/uL (ref 0.7–4.0)
MCH: 22.5 pg — ABNORMAL LOW (ref 26.0–34.0)
MCHC: 30.7 g/dL (ref 30.0–36.0)
MCV: 73.3 fL — ABNORMAL LOW (ref 80.0–100.0)
Monocytes Absolute: 0.9 10*3/uL (ref 0.1–1.0)
Monocytes Relative: 8 %
Neutro Abs: 7 10*3/uL (ref 1.7–7.7)
Neutrophils Relative %: 58 %
Platelets: 311 10*3/uL (ref 150–400)
RBC: 5.43 MIL/uL (ref 4.22–5.81)
RDW: 15.4 % (ref 11.5–15.5)
WBC: 12.1 10*3/uL — ABNORMAL HIGH (ref 4.0–10.5)
nRBC: 0 % (ref 0.0–0.2)

## 2023-03-05 LAB — GLUCOSE, CAPILLARY
Glucose-Capillary: 133 mg/dL — ABNORMAL HIGH (ref 70–99)
Glucose-Capillary: 149 mg/dL — ABNORMAL HIGH (ref 70–99)

## 2023-03-05 LAB — BRAIN NATRIURETIC PEPTIDE: B Natriuretic Peptide: 124.3 pg/mL — ABNORMAL HIGH (ref 0.0–100.0)

## 2023-03-05 MED ORDER — CEFADROXIL 500 MG PO CAPS: 1000.0000 mg | ORAL_CAPSULE | Freq: Once | ORAL | 0 refills | Status: AC

## 2023-03-05 MED ORDER — HYDRALAZINE HCL 100 MG PO TABS: 100.0000 mg | ORAL_TABLET | Freq: Three times a day (TID) | ORAL | 2 refills | Status: DC

## 2023-03-05 MED ORDER — POLYETHYLENE GLYCOL 3350 17 G PO PACK
17.0000 g | PACK | Freq: Every day | ORAL | Status: DC | PRN
  Administered 2023-03-05: 17 g via ORAL
  Filled 2023-03-05: qty 1

## 2023-03-05 MED ORDER — INFLUENZA VAC A&B SA ADJ QUAD 0.5 ML IM PRSY: 0.5000 mL | PREFILLED_SYRINGE | INTRAMUSCULAR | Status: DC

## 2023-03-05 MED ORDER — VERAPAMIL HCL ER 180 MG PO TBCR: 360.0000 mg | EXTENDED_RELEASE_TABLET | Freq: Every day | ORAL | Status: DC

## 2023-03-05 MED ORDER — ENALAPRILAT 1.25 MG/ML IV SOLN: 1.2500 mg | Freq: Four times a day (QID) | INTRAVENOUS | Status: DC | PRN

## 2023-03-05 MED ORDER — POTASSIUM CHLORIDE CRYS ER 20 MEQ PO TBCR
40.0000 meq | EXTENDED_RELEASE_TABLET | Freq: Once | ORAL | Status: AC
  Administered 2023-03-05: 40 meq via ORAL
  Filled 2023-03-05: qty 2

## 2023-03-05 MED ORDER — NYSTATIN 100000 UNIT/ML MT SUSP: 5.0000 mL | Freq: Four times a day (QID) | OROMUCOSAL | 0 refills | Status: AC

## 2023-03-05 MED ORDER — TAMSULOSIN HCL 0.4 MG PO CAPS: 0.8000 mg | ORAL_CAPSULE | Freq: Every day | ORAL | 2 refills | Status: DC

## 2023-03-05 MED ORDER — VERAPAMIL HCL ER 180 MG PO TBCR: 360.0000 mg | EXTENDED_RELEASE_TABLET | Freq: Every day | ORAL | 2 refills | Status: DC

## 2023-03-05 MED ORDER — CARVEDILOL 25 MG PO TABS: 25.0000 mg | ORAL_TABLET | Freq: Two times a day (BID) | ORAL | 2 refills | Status: DC

## 2023-03-05 MED ORDER — ATORVASTATIN CALCIUM 40 MG PO TABS: 40.0000 mg | ORAL_TABLET | Freq: Every day | ORAL | 2 refills | Status: DC

## 2023-03-05 NOTE — Discharge Summary (Signed)
Physician Discharge Summary   Patient: [name] MRN: [id number] DOB: 16-Mar-1951  Admit date:     03/01/2023  Discharge date: 03/05/23  Discharge Physician: Vernelle Emerald   PCP: Nolene Ebbs, MD   Recommendations at discharge:   Please take all medications exactly as instructed Please maintain a low sodium and low carbohydrate diet Return to the emergency department if you develop any worsening shortness of breath, chest pain, weakness, lightheadedness or loss of consciousness Please follow-up with your primary care provider in 1 to 2 weeks as well as following up with Dr. Harriet Masson with cardiology.  If you have not been contacted with a follow-up appointment with cardiology in the next several days please feel free to contact their clinic using the phone number attached to request the follow-up appointment time and date.  Discharge Diagnoses: Principal Problem:   Hypertensive emergency Active Problems:   Acute on chronic diastolic CHF (congestive heart failure) (HCC)   Elevated troponin level not due myocardial infarction   E-coli UTI   Benign prostatic hyperplasia with urinary retention   Type 2 diabetes mellitus without complication, without long-term current use of insulin (HCC)   Mixed hyperlipidemia due to type 2 diabetes mellitus (HCC)   Hypokalemia   Hypomagnesemia   Nicotine dependence, cigarettes, uncomplicated   Oropharyngeal candidiasis   Tobacco use disorder   History of TIA (transient ischemic attack)   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HCC)   CVA (cerebral vascular accident) ([place])   Acute exacerbation of CHF (congestive heart failure) ([place])   Hyperlipidemia   Acute urinary retention  Resolved Problems:   * No resolved hospital problems. *   Hospital Course: 72 y.o. male with medical history significant of diastolic congestive heart failure and HOCM (Echo 03/02/2023 EF 70-75%), prior history of alcohol dependence, history of brain tumor,  history of chronic diastolic CHF, chronic back pain, chronic headaches, history of CVA, diabetes mellitus type 2, hypertension, prior history of narcotic abuse, ongoing tobacco use presenting to the ED with a 2-day history of shortness of breath.    Upon evaluation in the emergency department, patient noted on presentation to be in hypertensive emergency with systolic blood pressure as high as 248/58.  Patient was placed on a nitroglycerin drip in the ED. the hospitalist group was called to assess the patient for admission to the hospital and the patient was admitted to the stepdown unit.  Clinically, patient was felt to be suffering from multiple sequela of hypertensive emergency including evidence of acute congestive heart failure and a markedly elevated troponin.  Dr. Harriet Masson with cardiology was consulted and with their assistance patient was slowly transitioned off of nitroglycerin infusion while multiple oral antihypertensives were initiated.  Echocardiogram was performed revealing evidence of HOCM.  Patient underwent cardiac MRI on 3/6.  Early in the hospitalization the patient also exhibited evidence of urinary retention.  After multiple unsuccessful attempts by care staff to place Foley catheter urology was consulted and a Foley catheter was successfully placed.  This was thought to be secondary to patient's history of BPH.  Patient was placed on 0.8 mg of Flomax daily.  Symptomatically patient improved with resolution of his shortness of breath.  Patient was placed on a regimen of Coreg 25 mg twice daily, hydralazine 100 mg 3 times daily and verapamil 360 mg daily prior to discharge which resulted in much improved blood pressures.  Arranges were made for the patient be discharged home in improved and stable condition on 03/05/2023 with close outpatient  follow-up with Cardiology.       Consultants: Dr. Harriet Masson with Cardiology Procedures performed: None  Disposition: Home Diet recommendation:   Discharge Diet Orders (From admission, onward)     Start     Ordered   03/05/23 0000  Diet - low sodium heart healthy        03/05/23 1313   03/05/23 0000  Diet Carb Modified        03/05/23 1313           Cardiac diet  DISCHARGE MEDICATION: Allergies as of 03/05/2023   No Known Allergies      Medication List     STOP taking these medications    amLODipine 10 MG tablet Commonly known as: NORVASC   cephALEXin 500 MG capsule Commonly known as: KEFLEX   diltiazem 120 MG 24 hr capsule Commonly known as: Cardizem CD   doxazosin 4 MG tablet Commonly known as: CARDURA   losartan 100 MG tablet Commonly known as: COZAAR   losartan-hydrochlorothiazide 100-12.5 MG tablet Commonly known as: HYZAAR   pantoprazole 40 MG tablet Commonly known as: PROTONIX   pravastatin 20 MG tablet Commonly known as: PRAVACHOL       TAKE these medications    albuterol 108 (90 Base) MCG/ACT inhaler Commonly known as: VENTOLIN HFA Inhale 1-2 puffs into the lungs every 6 (six) hours as needed for wheezing.   aspirin EC 81 MG tablet Take 81 mg by mouth every morning.   atorvastatin 40 MG tablet Commonly known as: LIPITOR Take 1 tablet (40 mg total) by mouth daily. Start taking on: March 06, 2023   carvedilol 25 MG tablet Commonly known as: COREG Take 1 tablet (25 mg total) by mouth 2 (two) times daily with a meal.   cefadroxil 500 MG capsule Commonly known as: DURICEF Take 2 capsules (1,000 mg total) by mouth once for 1 dose. One dose the evening of 3/7.   diclofenac Sodium 1 % Gel Commonly known as: VOLTAREN Apply 1 application. topically 4 (four) times daily as needed (pain).   hydrALAZINE 100 MG tablet Commonly known as: APRESOLINE Take 1 tablet (100 mg total) by mouth every 8 (eight) hours.   metFORMIN 500 MG tablet Commonly known as: GLUCOPHAGE Take 1 tablet by mouth twice a day What changed:  how much to take how to take this when to take this   nicotine 21  mg/24hr patch Commonly known as: NICODERM CQ - dosed in mg/24 hours Place 1 patch (21 mg total) onto the skin daily. What changed:  when to take this reasons to take this   nystatin 100000 UNIT/ML suspension Commonly known as: MYCOSTATIN Take 5 mLs (500,000 Units total) by mouth 4 (four) times daily for 6 days.   omeprazole 20 MG capsule Commonly known as: PRILOSEC Take 1 capsule (20 mg total) by mouth daily.   ondansetron 8 MG disintegrating tablet Commonly known as: ZOFRAN-ODT Take 1 tablet (8 mg total) by mouth every 8 (eight) hours as needed for nausea or vomiting.   sucralfate 1 GM/10ML suspension Commonly known as: Carafate Take 10 mLs (1 g total) by mouth 4 (four) times daily -  with meals and at bedtime.   tamsulosin 0.4 MG Caps capsule Commonly known as: FLOMAX TAKE TWO CAPSULES BY MOUTH AT BEDTIME   traMADol 50 MG tablet Commonly known as: ULTRAM Take 1 tablet by mouth twice a day as needed for severe pains   verapamil 180 MG CR tablet Commonly known as: CALAN-SR Take 2  tablets (360 mg total) by mouth daily. Start taking on: March 06, 2023        Follow-up Information     Nolene Ebbs, MD. Schedule an appointment as soon as possible for a visit in 1 week(s).   Specialty: Internal Medicine Contact information: [place] [address] [phone]         Berniece Salines, DO. Schedule an appointment as soon as possible for a visit in 2 week(s).   Specialty: Cardiology Contact information: [address] [place] [place] [address] [phone]                 Discharge Exam: Danley Danker Weights   03/02/23 0144 03/03/23 0414 03/05/23 0900  Weight: 68.1 kg 66 kg 66.4 kg   Constitutional: Awake alert and oriented x3, no associated distress.   Respiratory: clear to auscultation bilaterally, no wheezing, no crackles. Normal respiratory effort. No accessory muscle use.  Cardiovascular: Regular rate and rhythm, no murmurs /  rubs / gallops. No extremity edema. 2+ pedal pulses. No carotid bruits.  Abdomen: Abdomen is soft and nontender.  No evidence of intra-abdominal masses.  Positive bowel sounds noted in all quadrants.   Musculoskeletal: No joint deformity upper and lower extremities. Good ROM, no contractures. Normal muscle tone.     Condition at discharge: fair  The results of significant diagnostics from this hospitalization (including imaging, microbiology, ancillary and laboratory) are listed below for reference.   Imaging Studies: MR CARDIAC VELOCITY FLOW MAP  Result Date: 03/04/2023 CLINICAL DATA:  Suspected hypertrophic cardiomyopathy. EXAM: CARDIAC MRI TECHNIQUE: The patient was scanned on a 1.5 Tesla GE magnet. A dedicated cardiac coil was used. Functional imaging was done using Fiesta sequences. 2,3, and 4 chamber views were done to assess for RWMA's. Modified Simpson's rule using a short axis stack was used to calculate an ejection fraction on a dedicated work Conservation officer, nature. The patient received 10 cc of Gadavist. After 10 minutes inversion recovery sequences were used to assess for infiltration and scar tissue. FINDINGS: Limited images of the lung fields showed no gross abnormalities. Normal left ventricular size with moderate asymmetric focal basal septal hypertrophy (1.7 cm basal septum, 0.8 cm basal inferolateral wall). Mild global hypokinesis, EF 49%. Normal right ventricular size and systolic function, [id number]. Mild left atrial enlargement. Normal right atrium. Trileaflet aortic valve, no stenosis but moderate regurgitation (regurgitant fraction 28%). I do not see significant systolic anterior motion of the mitral valve on this study, moderate mitral regurgitation with regurgitant fraction 21%. On delayed enhancement imaging, there was a small area of thinning and full thickness late gadolinium enhancement near the true apex. MEASUREMENTS: MEASUREMENTS LVEDV 186 mL LVEDVi 104 mL/m2 LVSV 92 mL  LVEF 49% RVEDV 117 mL RVEDVi 65 mL/m2 RVSV 67 mL RVEF 57% Aortic forward volume 73 mL Aortic regurgitant fraction 28% T1 1087, ECV 29% IMPRESSION: 1. Normal left ventricular size with moderate asymmetric focal basal septal hypertrophy. Mild global hypokinesis with EF 49%. 2.  Normal RV size and systolic function, [id number]. 3. There is moderate mitral regurgitation (regurgitant fraction 21%) but I do not see systolic anterior motion of the mitral valve. 4.  Moderate aortic insufficiency with regurgitant fraction 28%. 5. Small area of thinning and transmural LGE near the true apex. This is < 15% of the LV myocardium. This is possibly a variant of hypertrophic cardiomyopathy. Dalton Mclean Electronically Signed   By: Loralie Champagne M.D.   On: 03/04/2023 18:26   MR  CARDIAC VELOCITY FLOW MAP  Result Date: 03/04/2023 CLINICAL DATA:  Suspected hypertrophic cardiomyopathy. EXAM: CARDIAC MRI TECHNIQUE: The patient was scanned on a 1.5 Tesla GE magnet. A dedicated cardiac coil was used. Functional imaging was done using Fiesta sequences. 2,3, and 4 chamber views were done to assess for RWMA's. Modified Simpson's rule using a short axis stack was used to calculate an ejection fraction on a dedicated work Conservation officer, nature. The patient received 10 cc of Gadavist. After 10 minutes inversion recovery sequences were used to assess for infiltration and scar tissue. FINDINGS: Limited images of the lung fields showed no gross abnormalities. Normal left ventricular size with moderate asymmetric focal basal septal hypertrophy (1.7 cm basal septum, 0.8 cm basal inferolateral wall). Mild global hypokinesis, EF 49%. Normal right ventricular size and systolic function, [id number]. Mild left atrial enlargement. Normal right atrium. Trileaflet aortic valve, no stenosis but moderate regurgitation (regurgitant fraction 28%). I do not see significant systolic anterior motion of the mitral valve on this study, moderate mitral  regurgitation with regurgitant fraction 21%. On delayed enhancement imaging, there was a small area of thinning and full thickness late gadolinium enhancement near the true apex. MEASUREMENTS: MEASUREMENTS LVEDV 186 mL LVEDVi 104 mL/m2 LVSV 92 mL LVEF 49% RVEDV 117 mL RVEDVi 65 mL/m2 RVSV 67 mL RVEF 57% Aortic forward volume 73 mL Aortic regurgitant fraction 28% T1 1087, ECV 29% IMPRESSION: 1. Normal left ventricular size with moderate asymmetric focal basal septal hypertrophy. Mild global hypokinesis with EF 49%. 2.  Normal RV size and systolic function, [id number]. 3. There is moderate mitral regurgitation (regurgitant fraction 21%) but I do not see systolic anterior motion of the mitral valve. 4.  Moderate aortic insufficiency with regurgitant fraction 28%. 5. Small area of thinning and transmural LGE near the true apex. This is < 15% of the LV myocardium. This is possibly a variant of hypertrophic cardiomyopathy. Dalton Mclean Electronically Signed   By: Loralie Champagne M.D.   On: 03/04/2023 18:26   MR CARDIAC MORPHOLOGY W WO CONTRAST  Result Date: 03/04/2023 CLINICAL DATA:  Suspected hypertrophic cardiomyopathy. EXAM: CARDIAC MRI TECHNIQUE: The patient was scanned on a 1.5 Tesla GE magnet. A dedicated cardiac coil was used. Functional imaging was done using Fiesta sequences. 2,3, and 4 chamber views were done to assess for RWMA's. Modified Simpson's rule using a short axis stack was used to calculate an ejection fraction on a dedicated work Conservation officer, nature. The patient received 10 cc of Gadavist. After 10 minutes inversion recovery sequences were used to assess for infiltration and scar tissue. FINDINGS: Limited images of the lung fields showed no gross abnormalities. Normal left ventricular size with moderate asymmetric focal basal septal hypertrophy (1.7 cm basal septum, 0.8 cm basal inferolateral wall). Mild global hypokinesis, EF 49%. Normal right ventricular size and systolic function, [id number].  Mild left atrial enlargement. Normal right atrium. Trileaflet aortic valve, no stenosis but moderate regurgitation (regurgitant fraction 28%). I do not see significant systolic anterior motion of the mitral valve on this study, moderate mitral regurgitation with regurgitant fraction 21%. On delayed enhancement imaging, there was a small area of thinning and full thickness late gadolinium enhancement near the true apex. MEASUREMENTS: MEASUREMENTS LVEDV 186 mL LVEDVi 104 mL/m2 LVSV 92 mL LVEF 49% RVEDV 117 mL RVEDVi 65 mL/m2 RVSV 67 mL RVEF 57% Aortic forward volume 73 mL Aortic regurgitant fraction 28% T1 1087, ECV 29% IMPRESSION: 1. Normal left ventricular size with moderate asymmetric focal basal  septal hypertrophy. Mild global hypokinesis with EF 49%. 2.  Normal RV size and systolic function, [id number]. 3. There is moderate mitral regurgitation (regurgitant fraction 21%) but I do not see systolic anterior motion of the mitral valve. 4.  Moderate aortic insufficiency with regurgitant fraction 28%. 5. Small area of thinning and transmural LGE near the true apex. This is < 15% of the LV myocardium. This is possibly a variant of hypertrophic cardiomyopathy. Dalton Mclean Electronically Signed   By: Loralie Champagne M.D.   On: 03/04/2023 18:26   ECHOCARDIOGRAM COMPLETE  Result Date: 03/02/2023    ECHOCARDIOGRAM REPORT   Patient Name:   MATTEO LOOKER Date of Exam: 03/02/2023 Medical Rec #:  [id number]       Height:       69.0 in Accession #:    [id number]      Weight:       150.1 lb Date of Birth:  06-Jun-1951       BSA:          1.829 m Patient Age:    57 years        BP:           139/38 mmHg Patient Gender: M               HR:           95 bpm. Exam Location:  Inpatient Procedure: 2D Echo, Cardiac Doppler and Color Doppler Indications:    [id number] Acute diastolic (congestive) heart failure  History:        Patient has prior history of Echocardiogram examinations. CHF,                 Stroke; Risk Factors:Diabetes and  Hypertension.  Sonographer:    Phineas Douglas Referring Phys: [phone] DANIEL V THOMPSON IMPRESSIONS  1. LV systolic function is vigorous with near cavity obliteration during systole. Turbulent flow through LVOT consistent with HOCM physiology Peak gradient through LVOT approximately 100 mm Hg. Marland Kitchen Left ventricular ejection fraction, by estimation, is 70 to 75%. The left ventricle has hyperdynamic function. There is mild left ventricular hypertrophy. Left ventricular diastolic parameters were normal.  2. Right ventricular systolic function is normal. The right ventricular size is normal.  3. A small pericardial effusion is present.  4. Systolic anterior motion of the anterior mitral leaflet. . Mild mitral valve regurgitation.  5. The aortic valve is tricuspid. Aortic valve regurgitation is mild to moderate. Aortic valve sclerosis/calcification is present, without any evidence of aortic stenosis. FINDINGS  Left Ventricle: LV systolic function is vigorous with near cavity obliteration during systole. Turbulent flow through LVOT consistent with HOCM physiology Peak gradient through LVOT approximately 100 mm Hg. Left ventricular ejection fraction, by estimation, is 70 to 75%. The left ventricle has hyperdynamic function. The left ventricular internal cavity size was normal in size. There is mild left ventricular hypertrophy. Left ventricular diastolic parameters were normal. Right Ventricle: The right ventricular size is normal. Right vetricular wall thickness was not assessed. Right ventricular systolic function is normal. Left Atrium: Left atrial size was normal in size. Right Atrium: Right atrial size was normal in size. Pericardium: A small pericardial effusion is present. Mitral Valve: Systolic anterior motion of the anterior mitral leaflet. There is mild thickening of the mitral valve leaflet(s). Mild mitral valve regurgitation. Tricuspid Valve: The tricuspid valve is normal in structure. Tricuspid valve regurgitation is  trivial. Aortic Valve: The aortic valve is tricuspid. Aortic valve regurgitation is mild to moderate.  Aortic regurgitation PHT measures 587 msec. Aortic valve sclerosis/calcification is present, without any evidence of aortic stenosis. Pulmonic Valve: The pulmonic valve was not well visualized. Pulmonic valve regurgitation is not visualized. Aorta: The aortic root and ascending aorta are structurally normal, with no evidence of dilitation. IAS/Shunts: No atrial level shunt detected by color flow Doppler.  LEFT VENTRICLE PLAX 2D LVIDd:         4.10 cm      Diastology LVIDs:         2.20 cm      LV e' medial:    6.09 cm/s LV PW:         1.10 cm      LV E/e' medial:  8.4 LV IVS:        1.10 cm      LV e' lateral:   6.85 cm/s LVOT diam:     2.00 cm      LV E/e' lateral: 7.5 LVOT Area:     3.14 cm  LV Volumes (MOD) LV vol d, MOD A2C: 87.0 ml LV vol d, MOD A4C: 102.0 ml LV vol s, MOD A2C: 28.7 ml LV vol s, MOD A4C: 28.8 ml LV SV MOD A2C:     58.3 ml LV SV MOD A4C:     102.0 ml LV SV MOD BP:      66.7 ml RIGHT VENTRICLE             IVC RV Basal diam:  3.40 cm     IVC diam: 1.50 cm RV S prime:     16.10 cm/s TAPSE (M-mode): 1.9 cm LEFT ATRIUM             Index        RIGHT ATRIUM           Index LA diam:        3.70 cm 2.02 cm/m   RA Area:     14.80 cm LA Vol (A2C):   47.6 ml 26.03 ml/m  RA Volume:   30.70 ml  16.79 ml/m LA Vol (A4C):   46.3 ml 25.32 ml/m LA Biplane Vol: 49.0 ml 26.79 ml/m  AORTIC VALVE AI PHT:      587 msec  AORTA Ao Root diam: 3.20 cm Ao Asc diam:  3.00 cm MITRAL VALVE MV Area (PHT): 4.12 cm    SHUNTS MV Decel Time: 184 msec    Systemic Diam: 2.00 cm MV E velocity: 51.40 cm/s MV A velocity: 93.40 cm/s MV E/A ratio:  0.55 Dorris Carnes MD Electronically signed by Dorris Carnes MD Signature Date/Time: 03/02/2023/6:15:00 PM    Final    DG Abd 2 Views  Result Date: 03/01/2023 CLINICAL DATA:  Vomiting and abdominal pain EXAM: ABDOMEN - 2 VIEW COMPARISON:  X-ray 11/03/2014.  CT 08/19/2022 FINDINGS: Gas is  seen in nondilated loops of small and large bowel. Scattered stool. Few air-filled mildly distended loops of small bowel right upper quadrant. Gas and stool in the rectum. No obstruction. No obvious free air beneath the diaphragm on the upright view. Degenerative changes of the spine. There are some presumed vascular calcifications along the pelvis. Overlapping cardiac leads. IMPRESSION: Nonspecific bowel gas pattern. Electronically Signed   By: Jill Side M.D.   On: 03/01/2023 14:47   DG Chest 2 View  Result Date: 03/01/2023 CLINICAL DATA:  Cough, flu like symptoms EXAM: CHEST - 2 VIEW COMPARISON:  Prior chest x-ray 12/14/2022 FINDINGS: Significant enlargement of the cardiopericardial silhouette appears greater than  previously seen. Additionally, fine interstitial prominence is present throughout the lungs with mild pulmonary vascular congestion. No overt pulmonary edema. No pleural effusion or pneumothorax. No focal airspace infiltrate. IMPRESSION: 1. Progressive enlargement of the cardiopericardial silhouette concerning for increasing cardiomegaly. 2. Pulmonary vascular congestion with diffuse fine interstitial prominence. Differential considerations include early interstitial edema versus atypical/viral respiratory infection. Electronically Signed   By: Jacqulynn Cadet M.D.   On: 03/01/2023 06:27    Microbiology: Results for orders placed or performed during the hospital encounter of 03/01/23  Resp panel by RT-PCR (RSV, Flu A&B, Covid) Anterior Nasal Swab     Status: None   Collection Time: 03/01/23  5:38 AM   Specimen: Anterior Nasal Swab  Result Value Ref Range Status   SARS Coronavirus 2 by RT PCR NEGATIVE NEGATIVE Final    Comment: (NOTE) SARS-CoV-2 target nucleic acids are NOT DETECTED.  The SARS-CoV-2 RNA is generally detectable in upper respiratory specimens during the acute phase of infection. The lowest concentration of SARS-CoV-2 viral copies this assay can detect is 138  copies/mL. A negative result does not preclude SARS-Cov-2 infection and should not be used as the sole basis for treatment or other patient management decisions. A negative result may occur with  improper specimen collection/handling, submission of specimen other than nasopharyngeal swab, presence of viral mutation(s) within the areas targeted by this assay, and inadequate number of viral copies(<138 copies/mL). A negative result must be combined with clinical observations, patient history, and epidemiological information. The expected result is Negative.  Fact Sheet for Patients:  EntrepreneurPulse.com.au  Fact Sheet for Healthcare Providers:  IncredibleEmployment.be  This test is no t yet approved or cleared by the [place] FDA and  has been authorized for detection and/or diagnosis of SARS-CoV-2 by FDA under an Emergency Use Authorization (EUA). This EUA will remain  in effect (meaning this test can be used) for the duration of the COVID-19 declaration under Section 564(b)(1) of the Act, 21 U.S.C.section 360bbb-3(b)(1), unless the authorization is terminated  or revoked sooner.       Influenza A by PCR NEGATIVE NEGATIVE Final   Influenza B by PCR NEGATIVE NEGATIVE Final    Comment: (NOTE) The Xpert Xpress SARS-CoV-2/FLU/RSV plus assay is intended as an aid in the diagnosis of influenza from Nasopharyngeal swab specimens and should not be used as a sole basis for treatment. Nasal washings and aspirates are unacceptable for Xpert Xpress SARS-CoV-2/FLU/RSV testing.  Fact Sheet for Patients: EntrepreneurPulse.com.au  Fact Sheet for Healthcare Providers: IncredibleEmployment.be  This test is not yet approved or cleared by the [place] FDA and has been authorized for detection and/or diagnosis of SARS-CoV-2 by FDA under an Emergency Use Authorization (EUA). This EUA will remain in effect (meaning  this test can be used) for the duration of the COVID-19 declaration under Section 564(b)(1) of the Act, 21 U.S.C. section 360bbb-3(b)(1), unless the authorization is terminated or revoked.     Resp Syncytial Virus by PCR NEGATIVE NEGATIVE Final    Comment: (NOTE) Fact Sheet for Patients: EntrepreneurPulse.com.au  Fact Sheet for Healthcare Providers: IncredibleEmployment.be  This test is not yet approved or cleared by the [place] FDA and has been authorized for detection and/or diagnosis of SARS-CoV-2 by FDA under an Emergency Use Authorization (EUA). This EUA will remain in effect (meaning this test can be used) for the duration of the COVID-19 declaration under Section 564(b)(1) of the Act, 21 U.S.C. section 360bbb-3(b)(1), unless the authorization is terminated or revoked.  Performed at [facility] [facility]  [facility] [address], [place], [place] [address]   Urine Culture     Status: Abnormal   Collection Time: 03/01/23 11:54 AM   Specimen: Urine, Random  Result Value Ref Range Status   Specimen Description   Final    URINE, RANDOM Performed at [facility] [address] [place], [place] [address]    Special Requests   Final    NONE Performed at [facility], [place] [address], [place], [place] [address]    Culture >=100,000 COLONIES/mL ESCHERICHIA COLI (A)  Final   Report Status 03/03/2023 FINAL  Final   Organism ID, Bacteria ESCHERICHIA COLI (A)  Final      Susceptibility   Escherichia coli - MIC*    AMPICILLIN 8 SENSITIVE Sensitive     CEFAZOLIN <=4 SENSITIVE Sensitive     CEFEPIME <=0.12 SENSITIVE Sensitive     CEFTRIAXONE <=0.25 SENSITIVE Sensitive     CIPROFLOXACIN >=4 RESISTANT Resistant     GENTAMICIN <=1 SENSITIVE Sensitive     IMIPENEM <=0.25 SENSITIVE Sensitive     NITROFURANTOIN <=16 SENSITIVE Sensitive     TRIMETH/SULFA <=20 SENSITIVE Sensitive     AMPICILLIN/SULBACTAM  4 SENSITIVE Sensitive     PIP/TAZO <=4 SENSITIVE Sensitive     * >=100,000 COLONIES/mL ESCHERICHIA COLI  Respiratory (~20 pathogens) panel by PCR     Status: None   Collection Time: 03/01/23  1:01 PM   Specimen: Nasopharyngeal Swab; Respiratory  Result Value Ref Range Status   Adenovirus NOT DETECTED NOT DETECTED Final   Coronavirus 229E NOT DETECTED NOT DETECTED Final    Comment: (NOTE) The Coronavirus on the Respiratory Panel, DOES NOT test for the novel  Coronavirus (2019 nCoV)    Coronavirus HKU1 NOT DETECTED NOT DETECTED Final   Coronavirus NL63 NOT DETECTED NOT DETECTED Final   Coronavirus OC43 NOT DETECTED NOT DETECTED Final   Metapneumovirus NOT DETECTED NOT DETECTED Final   Rhinovirus / Enterovirus NOT DETECTED NOT DETECTED Final   Influenza A NOT DETECTED NOT DETECTED Final   Influenza B NOT DETECTED NOT DETECTED Final   Parainfluenza Virus 1 NOT DETECTED NOT DETECTED Final   Parainfluenza Virus 2 NOT DETECTED NOT DETECTED Final   Parainfluenza Virus 3 NOT DETECTED NOT DETECTED Final   Parainfluenza Virus 4 NOT DETECTED NOT DETECTED Final   Respiratory Syncytial Virus NOT DETECTED NOT DETECTED Final   Bordetella pertussis NOT DETECTED NOT DETECTED Final   Bordetella Parapertussis NOT DETECTED NOT DETECTED Final   Chlamydophila pneumoniae NOT DETECTED NOT DETECTED Final   Mycoplasma pneumoniae NOT DETECTED NOT DETECTED Final    Comment: Performed at [facility], [place]. [address] [place], [place] [address]  MRSA Next Gen by PCR, Nasal     Status: None   Collection Time: 03/01/23  1:01 PM   Specimen: Nasal Mucosa; Nasal Swab  Result Value Ref Range Status   MRSA by PCR Next Gen NOT DETECTED NOT DETECTED Final    Comment: (NOTE) The GeneXpert MRSA Assay (FDA approved for NASAL specimens only), is one component of a comprehensive MRSA colonization surveillance program. It is not intended to diagnose MRSA infection nor to guide or monitor treatment for MRSA  infections. Test performance is not FDA approved in patients less than 18 years old. Performed at [facility], [place] [address] [place],  [address]     Labs: CBC: Recent Labs  Lab 03/01/23 [phone] 03/02/23 0801 03/03/23 [id number] 03/04/23 0250 03/05/23 0303  WBC 9.5 13.6* 13.8* 14.8* 12.1*  NEUTROABS 5.7 10.5* 10.4* 10.1* 7.0  HGB 12.9* 11.8* 11.6* 12.6* 12.2*  HCT 42.0 37.7* 37.6* 42.1 39.8  MCV 72.2* 71.3* 73.2* 74.3* 73.3*  PLT 263 293 310 286 [id number]   Basic Metabolic Panel: Recent Labs  Lab 03/01/23 0657 03/01/23 1233 03/02/23 0801 03/03/23 0749 03/04/23 0250 03/05/23 0303  NA 140  --  141 140 141 138  K 3.2*  --  2.8* 3.2* 4.0 3.6  CL 110  --  107 107 110 107  CO2 23  --  '22 23 22 23  '$ GLUCOSE 108*  --  139* 136* 147* 131*  BUN 19  --  22 25* 29* 27*  CREATININE 0.80  --  0.99 1.18 1.13 1.10  CALCIUM 8.9  --  9.0 8.9 9.3 8.9  MG  --  1.5* 2.2 2.1 2.0 2.2  PHOS  --   --   --  3.3  --   --    Liver Function Tests: Recent Labs  Lab 03/01/23 0657 03/05/23 0303  AST 20 37  ALT 13 23  ALKPHOS 60 45  BILITOT 0.9 0.8  PROT 7.9 7.0  ALBUMIN 3.7 3.6   CBG: Recent Labs  Lab 03/04/23 1459 03/04/23 1816 03/04/23 2122 03/05/23 0722 03/05/23 1154  GLUCAP 161* 226* 102* 133* 149*    Discharge time spent: greater than 30 minutes.  Signed: Vernelle Emerald, MD [facility] 03/05/2023

## 2023-03-05 NOTE — Progress Notes (Signed)
Patient discharge instructions reviewed; patient does not have any questions/concerns at this time. All IV's removed, education on foley care and follow up discussed. All belongings accounted for by patient. Patient escorted via wheelchair to family members vehicle without complication.

## 2023-03-05 NOTE — TOC Transition Note (Signed)
Transition of Care [facility]) - CM/SW Discharge Note   Patient Details  Name: [name] MRN: [id number] Date of Birth: August 25, 1951  [facility]) CM/SW Contact:  Roseanne Kaufman, RN Phone Number: 03/05/2023, 2:18 PM   Clinical Narrative:   PT did not recommend any HH services.   No additional TOC needs at this time.    Final next level of care: Home/Self Care Barriers to Discharge: No Barriers Identified   Patient Goals and CMS Choice   Choice offered to / list presented to : Patient  Discharge Placement                         Discharge Plan and Services Additional resources added to the After Visit Summary for   In-house Referral: NA Discharge Planning Services: CM Consult Post Acute Care Choice: Home Health          DME Arranged: N/A DME Agency: NA       HH Arranged: NA HH Agency: NA        Social Determinants of Health (SDOH) Interventions SDOH Screenings   Food Insecurity: No Food Insecurity (03/03/2023)  Housing: Low Risk  (03/03/2023)  Transportation Needs: No Transportation Needs (03/03/2023)  Utilities: Not At Risk (03/03/2023)  Tobacco Use: High Risk (03/01/2023)     Readmission Risk Interventions     No data to display

## 2023-03-05 NOTE — Progress Notes (Signed)
Progress Note  Patient Name: [name] Date of Encounter: 03/05/2023  Primary Cardiologist: None   Subjective   Patient seen and examined at his bedside.  Inpatient Medications    Scheduled Meds:  aspirin EC  81 mg Oral q morning   atorvastatin  40 mg Oral Daily   carvedilol  25 mg Oral BID WC   cefadroxil  1,000 mg Oral BID   Chlorhexidine Gluconate Cloth  6 each Topical Daily   enoxaparin (LOVENOX) injection  40 mg Subcutaneous Q24H   fluticasone  2 spray Each Nare Daily   guaiFENesin  1,200 mg Oral BID   hydrALAZINE  100 mg Oral Q8H   insulin aspart  0-9 Units Subcutaneous TID WC   loratadine  10 mg Oral Daily   nystatin  5 mL Oral QID   pantoprazole  40 mg Oral Daily   sodium chloride flush  3 mL Intravenous Q12H   tamsulosin  0.8 mg Oral QHS   verapamil  240 mg Oral Daily   Continuous Infusions:  sodium chloride Stopped (03/01/23 1613)   PRN Meds: sodium chloride, acetaminophen, diclofenac Sodium, dicyclomine, enalaprilat, hydrOXYzine, influenza vaccine adjuvanted, loperamide, methocarbamol, morphine injection, naproxen, nicotine, ondansetron (ZOFRAN) IV, ondansetron, mouth rinse, pneumococcal 20-valent conjugate vaccine, polyethylene glycol, sodium chloride flush   Vital Signs    Vitals:   03/05/23 0750 03/05/23 0800 03/05/23 0805 03/05/23 0900  BP: 139/71 (!) 152/44  (!) 158/36  Pulse: 69 68  64  Resp: (!) '23 19  12  '$ Temp:   98.3 F (36.8 C)   TempSrc:   Oral   SpO2: 94% 93%  95%  Weight:    66.4 kg  Height:        Intake/Output Summary (Last 24 hours) at 03/05/2023 1107 Last data filed at 03/05/2023 0800 Gross per 24 hour  Intake 960 ml  Output 475 ml  Net 485 ml   Filed Weights   03/02/23 0144 03/03/23 0414 03/05/23 0900  Weight: 68.1 kg 66 kg 66.4 kg    Telemetry    Sinus rhythm- Personally Reviewed  ECG    none - Personally Reviewed  Physical Exam     General: Comfortable, sitting up in a chair Head: Atraumatic, normal size   Eyes: PEERLA, EOMI  Neck: Supple, normal JVD Cardiac: Normal S1, S2; RRR; no murmurs, rubs, or gallops Lungs: Clear to auscultation bilaterally Abd: Soft, nontender, no hepatomegaly  Ext: warm, no edema Musculoskeletal: No deformities, BUE and BLE strength normal and equal Skin: Warm and dry, no rashes   Neuro: Alert and oriented to person, place, time, and situation, CNII-XII grossly intact, no focal deficits  Psych: Normal mood and affect   Labs    Chemistry Recent Labs  Lab 03/01/23 0657 03/02/23 0801 03/03/23 0749 03/04/23 0250 03/05/23 0303  NA 140   < > 140 141 138  K 3.2*   < > 3.2* 4.0 3.6  CL 110   < > 107 110 107  CO2 23   < > '23 22 23  '$ GLUCOSE 108*   < > 136* 147* 131*  BUN 19   < > 25* 29* 27*  CREATININE 0.80   < > 1.18 1.13 1.10  CALCIUM 8.9   < > 8.9 9.3 8.9  PROT 7.9  --   --   --  7.0  ALBUMIN 3.7  --   --   --  3.6  AST 20  --   --   --  37  ALT 13  --   --   --  23  ALKPHOS 60  --   --   --  45  BILITOT 0.9  --   --   --  0.8  GFRNONAA >60   < > >60 >60 >60  ANIONGAP 7   < > '10 9 8   '$ < > = values in this interval not displayed.     Hematology Recent Labs  Lab 03/03/23 0749 03/04/23 0250 03/05/23 0303  WBC 13.8* 14.8* 12.1*  RBC 5.14 5.67 5.43  HGB 11.6* 12.6* 12.2*  HCT 37.6* 42.1 39.8  MCV 73.2* 74.3* 73.3*  MCH 22.6* 22.2* 22.5*  MCHC 30.9 29.9* 30.7  RDW 15.2 15.4 15.4  PLT 310 286 311    Cardiac EnzymesNo results for input(s): "TROPONINI" in the last 168 hours. No results for input(s): "TROPIPOC" in the last 168 hours.   BNP Recent Labs  Lab 03/01/23 0657 03/02/23 0801 03/05/23 0303  BNP 894.6* 764.5* 124.3*     DDimer No results for input(s): "DDIMER" in the last 168 hours.   Radiology    MR CARDIAC VELOCITY FLOW MAP  Result Date: 03/04/2023 CLINICAL DATA:  Suspected hypertrophic cardiomyopathy. EXAM: CARDIAC MRI TECHNIQUE: The patient was scanned on a 1.5 Tesla GE magnet. A dedicated cardiac coil was used. Functional  imaging was done using Fiesta sequences. 2,3, and 4 chamber views were done to assess for RWMA's. Modified Simpson's rule using a short axis stack was used to calculate an ejection fraction on a dedicated work Conservation officer, nature. The patient received 10 cc of Gadavist. After 10 minutes inversion recovery sequences were used to assess for infiltration and scar tissue. FINDINGS: Limited images of the lung fields showed no gross abnormalities. Normal left ventricular size with moderate asymmetric focal basal septal hypertrophy (1.7 cm basal septum, 0.8 cm basal inferolateral wall). Mild global hypokinesis, EF 49%. Normal right ventricular size and systolic function, [id number]. Mild left atrial enlargement. Normal right atrium. Trileaflet aortic valve, no stenosis but moderate regurgitation (regurgitant fraction 28%). I do not see significant systolic anterior motion of the mitral valve on this study, moderate mitral regurgitation with regurgitant fraction 21%. On delayed enhancement imaging, there was a small area of thinning and full thickness late gadolinium enhancement near the true apex. MEASUREMENTS: MEASUREMENTS LVEDV 186 mL LVEDVi 104 mL/m2 LVSV 92 mL LVEF 49% RVEDV 117 mL RVEDVi 65 mL/m2 RVSV 67 mL RVEF 57% Aortic forward volume 73 mL Aortic regurgitant fraction 28% T1 1087, ECV 29% IMPRESSION: 1. Normal left ventricular size with moderate asymmetric focal basal septal hypertrophy. Mild global hypokinesis with EF 49%. 2.  Normal RV size and systolic function, [id number]. 3. There is moderate mitral regurgitation (regurgitant fraction 21%) but I do not see systolic anterior motion of the mitral valve. 4.  Moderate aortic insufficiency with regurgitant fraction 28%. 5. Small area of thinning and transmural LGE near the true apex. This is < 15% of the LV myocardium. This is possibly a variant of hypertrophic cardiomyopathy. Dalton Mclean Electronically Signed   By: Loralie Champagne M.D.   On: 03/04/2023 18:26    MR CARDIAC VELOCITY FLOW MAP  Result Date: 03/04/2023 CLINICAL DATA:  Suspected hypertrophic cardiomyopathy. EXAM: CARDIAC MRI TECHNIQUE: The patient was scanned on a 1.5 Tesla GE magnet. A dedicated cardiac coil was used. Functional imaging was done using Fiesta sequences. 2,3, and 4 chamber views were done to assess for RWMA's. Modified Simpson's rule using a  short axis stack was used to calculate an ejection fraction on a dedicated work Conservation officer, nature. The patient received 10 cc of Gadavist. After 10 minutes inversion recovery sequences were used to assess for infiltration and scar tissue. FINDINGS: Limited images of the lung fields showed no gross abnormalities. Normal left ventricular size with moderate asymmetric focal basal septal hypertrophy (1.7 cm basal septum, 0.8 cm basal inferolateral wall). Mild global hypokinesis, EF 49%. Normal right ventricular size and systolic function, [id number]. Mild left atrial enlargement. Normal right atrium. Trileaflet aortic valve, no stenosis but moderate regurgitation (regurgitant fraction 28%). I do not see significant systolic anterior motion of the mitral valve on this study, moderate mitral regurgitation with regurgitant fraction 21%. On delayed enhancement imaging, there was a small area of thinning and full thickness late gadolinium enhancement near the true apex. MEASUREMENTS: MEASUREMENTS LVEDV 186 mL LVEDVi 104 mL/m2 LVSV 92 mL LVEF 49% RVEDV 117 mL RVEDVi 65 mL/m2 RVSV 67 mL RVEF 57% Aortic forward volume 73 mL Aortic regurgitant fraction 28% T1 1087, ECV 29% IMPRESSION: 1. Normal left ventricular size with moderate asymmetric focal basal septal hypertrophy. Mild global hypokinesis with EF 49%. 2.  Normal RV size and systolic function, [id number]. 3. There is moderate mitral regurgitation (regurgitant fraction 21%) but I do not see systolic anterior motion of the mitral valve. 4.  Moderate aortic insufficiency with regurgitant fraction 28%. 5. Small  area of thinning and transmural LGE near the true apex. This is < 15% of the LV myocardium. This is possibly a variant of hypertrophic cardiomyopathy. Dalton Mclean Electronically Signed   By: Loralie Champagne M.D.   On: 03/04/2023 18:26   MR CARDIAC MORPHOLOGY W WO CONTRAST  Result Date: 03/04/2023 CLINICAL DATA:  Suspected hypertrophic cardiomyopathy. EXAM: CARDIAC MRI TECHNIQUE: The patient was scanned on a 1.5 Tesla GE magnet. A dedicated cardiac coil was used. Functional imaging was done using Fiesta sequences. 2,3, and 4 chamber views were done to assess for RWMA's. Modified Simpson's rule using a short axis stack was used to calculate an ejection fraction on a dedicated work Conservation officer, nature. The patient received 10 cc of Gadavist. After 10 minutes inversion recovery sequences were used to assess for infiltration and scar tissue. FINDINGS: Limited images of the lung fields showed no gross abnormalities. Normal left ventricular size with moderate asymmetric focal basal septal hypertrophy (1.7 cm basal septum, 0.8 cm basal inferolateral wall). Mild global hypokinesis, EF 49%. Normal right ventricular size and systolic function, [id number]. Mild left atrial enlargement. Normal right atrium. Trileaflet aortic valve, no stenosis but moderate regurgitation (regurgitant fraction 28%). I do not see significant systolic anterior motion of the mitral valve on this study, moderate mitral regurgitation with regurgitant fraction 21%. On delayed enhancement imaging, there was a small area of thinning and full thickness late gadolinium enhancement near the true apex. MEASUREMENTS: MEASUREMENTS LVEDV 186 mL LVEDVi 104 mL/m2 LVSV 92 mL LVEF 49% RVEDV 117 mL RVEDVi 65 mL/m2 RVSV 67 mL RVEF 57% Aortic forward volume 73 mL Aortic regurgitant fraction 28% T1 1087, ECV 29% IMPRESSION: 1. Normal left ventricular size with moderate asymmetric focal basal septal hypertrophy. Mild global hypokinesis with EF 49%. 2.  Normal  RV size and systolic function, [id number]. 3. There is moderate mitral regurgitation (regurgitant fraction 21%) but I do not see systolic anterior motion of the mitral valve. 4.  Moderate aortic insufficiency with regurgitant fraction 28%. 5. Small area of thinning and transmural LGE near  the true apex. This is < 15% of the LV myocardium. This is possibly a variant of hypertrophic cardiomyopathy. Dalton Mclean Electronically Signed   By: Loralie Champagne M.D.   On: 03/04/2023 18:26    Cardiac Studies   Cardiac MR 03/04/2023 Narrative & Impression  CLINICAL DATA:  Suspected hypertrophic cardiomyopathy.   EXAM: CARDIAC MRI   TECHNIQUE: The patient was scanned on a 1.5 Tesla GE magnet. A dedicated cardiac coil was used. Functional imaging was done using Fiesta sequences. 2,3, and 4 chamber views were done to assess for RWMA's. Modified Simpson's rule using a short axis stack was used to calculate an ejection fraction on a dedicated work Conservation officer, nature. The patient received 10 cc of Gadavist. After 10 minutes inversion recovery sequences were used to assess for infiltration and scar tissue.   FINDINGS: Limited images of the lung fields showed no gross abnormalities.   Normal left ventricular size with moderate asymmetric focal basal septal hypertrophy (1.7 cm basal septum, 0.8 cm basal inferolateral wall). Mild global hypokinesis, EF 49%. Normal right ventricular size and systolic function, [id number]. Mild left atrial enlargement. Normal right atrium. Trileaflet aortic valve, no stenosis but moderate regurgitation (regurgitant fraction 28%). I do not see significant systolic anterior motion of the mitral valve on this study, moderate mitral regurgitation with regurgitant fraction 21%.   On delayed enhancement imaging, there was a small area of thinning and full thickness late gadolinium enhancement near the true apex.   MEASUREMENTS: MEASUREMENTS LVEDV 186 mL   LVEDVi 104  mL/m2 LVSV 92 mL   LVEF 49%   RVEDV 117 mL RVEDVi 65 mL/m2 RVSV 67 mL   RVEF 57%   Aortic forward volume 73 mL   Aortic regurgitant fraction 28%   T1 1087, ECV 29%   IMPRESSION: 1. Normal left ventricular size with moderate asymmetric focal basal septal hypertrophy. Mild global hypokinesis with EF 49%.   2.  Normal RV size and systolic function, [id number].   3. There is moderate mitral regurgitation (regurgitant fraction 21%) but I do not see systolic anterior motion of the mitral valve.   4.  Moderate aortic insufficiency with regurgitant fraction 28%.   5. Small area of thinning and transmural LGE near the true apex. This is < 15% of the LV myocardium.   This is possibly a variant of hypertrophic cardiomyopathy.    TTE 03/02/2023 IMPRESSIONS     1. LV systolic function is vigorous with near cavity obliteration during  systole. Turbulent flow through LVOT consistent with HOCM physiology Peak  gradient through LVOT approximately 100 mm Hg. Marland Kitchen Left ventricular ejection  fraction, by estimation, is 70  to 75%. The left ventricle has hyperdynamic function. There is mild left  ventricular hypertrophy. Left ventricular diastolic parameters were  normal.   2. Right ventricular systolic function is normal. The right ventricular  size is normal.   3. A small pericardial effusion is present.   4. Systolic anterior motion of the anterior mitral leaflet. . Mild mitral  valve regurgitation.   5. The aortic valve is tricuspid. Aortic valve regurgitation is mild to  moderate. Aortic valve sclerosis/calcification is present, without any  evidence of aortic stenosis.   FINDINGS   Left Ventricle: LV systolic function is vigorous with near cavity  obliteration during systole. Turbulent flow through LVOT consistent with  HOCM physiology Peak gradient through LVOT approximately 100 mm Hg. Left  ventricular ejection fraction, by  estimation, is 70 to 75%. The left  ventricle has  hyperdynamic function.  The left ventricular internal cavity size was normal in size. There is  mild left ventricular hypertrophy. Left ventricular diastolic parameters  were normal.   Right Ventricle: The right ventricular size is normal. Right vetricular  wall thickness was not assessed. Right ventricular systolic function is  normal.   Left Atrium: Left atrial size was normal in size.   Right Atrium: Right atrial size was normal in size.   Pericardium: A small pericardial effusion is present.   Mitral Valve: Systolic anterior motion of the anterior mitral leaflet.  There is mild thickening of the mitral valve leaflet(s). Mild mitral valve  regurgitation.   Tricuspid Valve: The tricuspid valve is normal in structure. Tricuspid  valve regurgitation is trivial.   Aortic Valve: The aortic valve is tricuspid. Aortic valve regurgitation is  mild to moderate. Aortic regurgitation PHT measures 587 msec. Aortic valve  sclerosis/calcification is present, without any evidence of aortic  stenosis.   Pulmonic Valve: The pulmonic valve was not well visualized. Pulmonic valve  regurgitation is not visualized.   Aorta: The aortic root and ascending aorta are structurally normal, with  no evidence of dilitation.   IAS/Shunts: No atrial level shunt detected by color flow Doppler.       Patient Profile     72 y.o. male HOCM, chronic diastolic heart failure, history of alcohol dependence, history of brain tumor, chronic back pain, chronic headaches, history of CVA, type 2 DM, HTN, history of narcotic abuse, ongoing tobacco use ,  Assessment & Plan    Acute on chronic diastolic heart failure-this has resolved. Hypertrophic cardiomyopathy Hypertensive emergency -heart resolved  His blood pressure has improved significantly.  But still slightly elevated will increase the verapamil to 360 mg daily Continue Coreg 25 mg twice daily.  Hydralazine 100 mg 3 times daily.  Please keep the patient  off any ACE or ARB's were since his creatinine in hypertrophic cardiomyopathy. Discussed his cardiac MRI with him.  He was by the bedside family no questions at this time.  From a cardiovascular standpoint he can be discharged to home on current medication regimen.  He will follow-up with me in 2 weeks.      For questions or updates, please contact [place] Please consult [email or web address] for contact info under Cardiology/STEMI.      Signed, Larue Drawdy, DO  03/05/2023, 11:07 AM

## 2023-03-05 NOTE — Discharge Instructions (Signed)
Please take all medications exactly as instructed Please maintain a low sodium and low carbohydrate diet Return to the emergency department if you develop any worsening shortness of breath, chest pain, weakness, lightheadedness or loss of consciousness Please follow-up with your primary care provider in 1 to 2 weeks as well as following up with Dr. Harriet Masson with cardiology.  If you have not been contacted with a follow-up appointment with cardiology in the next several days please feel free to contact their clinic using the phone number attached to request the follow-up appointment time and date.

## 2023-03-09 ENCOUNTER — Telehealth: Payer: Self-pay

## 2023-03-09 NOTE — Telephone Encounter (Signed)
Called pt to set up an appointment. No answer. Left message for him to return the call.

## 2023-03-10 ENCOUNTER — Telehealth: Payer: Self-pay

## 2023-03-10 DIAGNOSIS — E1142 Type 2 diabetes mellitus with diabetic polyneuropathy: Secondary | ICD-10-CM | POA: Diagnosis not present

## 2023-03-10 DIAGNOSIS — I10 Essential (primary) hypertension: Secondary | ICD-10-CM | POA: Diagnosis not present

## 2023-03-10 DIAGNOSIS — I5032 Chronic diastolic (congestive) heart failure: Secondary | ICD-10-CM | POA: Diagnosis not present

## 2023-03-10 DIAGNOSIS — N139 Obstructive and reflux uropathy, unspecified: Secondary | ICD-10-CM | POA: Diagnosis not present

## 2023-03-10 DIAGNOSIS — K5909 Other constipation: Secondary | ICD-10-CM | POA: Diagnosis not present

## 2023-03-10 NOTE — Telephone Encounter (Signed)
Called pt 4 times. The phone rang, no answer. Voicemail full, unable to leave message.

## 2023-03-10 NOTE — Telephone Encounter (Signed)
Patient's sister is returning call. 

## 2023-03-10 NOTE — Telephone Encounter (Signed)
Called pt's sister. No answer, left message for her to have her brother call the office to make an appointment.

## 2023-03-10 NOTE — Telephone Encounter (Signed)
Patient is returning call. Transferred to Jasmine, RN.  

## 2023-03-10 NOTE — Telephone Encounter (Signed)
Left message for patient's sister to return the call.

## 2023-03-10 NOTE — Telephone Encounter (Signed)
Aappt made for 03/16/23

## 2023-03-12 DIAGNOSIS — N401 Enlarged prostate with lower urinary tract symptoms: Secondary | ICD-10-CM | POA: Diagnosis not present

## 2023-03-12 DIAGNOSIS — R338 Other retention of urine: Secondary | ICD-10-CM | POA: Diagnosis not present

## 2023-03-16 ENCOUNTER — Encounter: Payer: Self-pay | Admitting: Cardiology

## 2023-03-16 ENCOUNTER — Ambulatory Visit: Payer: Medicare HMO | Attending: Cardiology | Admitting: Cardiology

## 2023-03-16 VITALS — BP 190/60 | HR 78 | Ht 69.0 in | Wt 154.0 lb

## 2023-03-16 DIAGNOSIS — I10 Essential (primary) hypertension: Secondary | ICD-10-CM

## 2023-03-16 DIAGNOSIS — E1169 Type 2 diabetes mellitus with other specified complication: Secondary | ICD-10-CM | POA: Diagnosis not present

## 2023-03-16 DIAGNOSIS — I421 Obstructive hypertrophic cardiomyopathy: Secondary | ICD-10-CM | POA: Diagnosis not present

## 2023-03-16 DIAGNOSIS — E782 Mixed hyperlipidemia: Secondary | ICD-10-CM

## 2023-03-16 DIAGNOSIS — E119 Type 2 diabetes mellitus without complications: Secondary | ICD-10-CM | POA: Diagnosis not present

## 2023-03-16 DIAGNOSIS — I5033 Acute on chronic diastolic (congestive) heart failure: Secondary | ICD-10-CM

## 2023-03-16 NOTE — Patient Instructions (Signed)
Medication Instructions:  Your physician recommends that you continue on your current medications as directed. Please refer to the Current Medication list given to you today.  *If you need a refill on your cardiac medications before your next appointment, please call your pharmacy*   Lab Work: None   Testing/Procedures: None   Follow-Up: At [facility], you and your health needs are our priority.  As part of our continuing mission to provide you with exceptional heart care, we have created designated Provider Care Teams.  These Care Teams include your primary Cardiologist (physician) and Advanced Practice Providers (APPs -  Physician Assistants and Nurse Practitioners) who all work together to provide you with the care you need, when you need it.  We recommend signing up for the patient portal called "MyChart".  Sign up information is provided on this After Visit Summary.  MyChart is used to connect with patients for Virtual Visits (Telemedicine).  Patients are able to view lab/test results, encounter notes, upcoming appointments, etc.  Non-urgent messages can be sent to your provider as well.   To learn more about what you can do with MyChart, go to [email or web address].    Your next appointment:   3 month(s)  Provider:   Berniece Salines, DO

## 2023-03-18 NOTE — Progress Notes (Signed)
Cardiology Office Note:    Date:  03/18/2023   ID:  [name], DOB 11-01-1951, MRN [id number]  PCP:  Samuel Ebbs, MD  Cardiologist:  Berniece Salines, DO  Electrophysiologist:  None   Referring MD: Samuel Ebbs, MD   " I am ok"   History of Present Illness:    [name] is a 72 y.o. male with a hx of diastolic congestive heart failure and HOCM (Echo 03/02/2023 EF 70-75%), prior history of alcohol dependence, history of brain tumor, history of chronic diastolic CHF, chronic back pain, chronic headaches, history of CVA, diabetes mellitus type 2, hypertension, prior history of narcotic abuse, ongoing tobacco use  here today for a follow up visit.   Recently he was discharged from [facility] where he was treated for accelerated hypertension, respiratory failure with hypoxia and exacerbation of heart failure.   Since his discharge he tells me that he has been doing well from a CV standpoint He is her today with his niece.   No complaints at this time.  Past Medical History:  Diagnosis Date   Alcohol dependence ([place]) 12/10/2013   Angioedema of lips 12/24/2013   Brain tumor ([place])    CHF (congestive heart failure) ([facility])    diastolic   Chronic back pain    Chronic headache    CVA (cerebral vascular accident) ([place])    Diabetes mellitus    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[place]) 02/18/2020   HTN (hypertension), malignant 09/06/2012   Hypertension    Narcotic abuse ([place])    Normal cardiac stress test 2009   TIA (transient ischemic attack) 09/06/2012   Tobacco use disorder 12/10/2013   UTI (urinary tract infection)    Vertigo 09/06/2012    Past Surgical History:  Procedure Laterality Date   NO PAST SURGERIES      Current Medications: Current Meds  Medication Sig   albuterol (PROVENTIL HFA;VENTOLIN HFA) 108 (90 Base) MCG/ACT inhaler Inhale 1-2 puffs into the lungs every 6 (six) hours as needed for wheezing.   aspirin EC 81 MG tablet Take 81 mg by  mouth every morning.   atorvastatin (LIPITOR) 40 MG tablet Take 1 tablet (40 mg total) by mouth daily.   carvedilol (COREG) 25 MG tablet Take 1 tablet (25 mg total) by mouth 2 (two) times daily with a meal.   diclofenac Sodium (VOLTAREN) 1 % GEL Apply 1 application. topically 4 (four) times daily as needed (pain).   hydrALAZINE (APRESOLINE) 100 MG tablet Take 1 tablet (100 mg total) by mouth every 8 (eight) hours.   metFORMIN (GLUCOPHAGE) 500 MG tablet Take 1 tablet by mouth twice a day (Patient taking differently: Take 500 mg by mouth 2 (two) times daily.)   nicotine (NICODERM CQ - DOSED IN MG/24 HOURS) 21 mg/24hr patch Place 1 patch (21 mg total) onto the skin daily. (Patient taking differently: Place 21 mg onto the skin daily as needed (smoking cessation).)   omeprazole (PRILOSEC) 20 MG capsule Take 1 capsule (20 mg total) by mouth daily.   ondansetron (ZOFRAN-ODT) 8 MG disintegrating tablet Take 1 tablet (8 mg total) by mouth every 8 (eight) hours as needed for nausea or vomiting.   sucralfate (CARAFATE) 1 GM/10ML suspension Take 10 mLs (1 g total) by mouth 4 (four) times daily -  with meals and at bedtime.   tamsulosin (FLOMAX) 0.4 MG CAPS capsule TAKE TWO CAPSULES BY MOUTH AT BEDTIME   traMADol (ULTRAM) 50 MG tablet Take 1 tablet by mouth twice a  day as needed for severe pains   verapamil (CALAN-SR) 180 MG CR tablet Take 2 tablets (360 mg total) by mouth daily.     Allergies:   Patient has no known allergies.   Social History   Socioeconomic History   Marital status: Legally Separated    Spouse name: Not on file   Number of children: Not on file   Years of education: Not on file   Highest education level: Not on file  Occupational History   Not on file  Tobacco Use   Smoking status: Every Day    Packs/day: .5    Types: Cigarettes   Smokeless tobacco: Current  Vaping Use   Vaping Use: Never used  Substance and Sexual Activity   Alcohol use: Yes   Drug use: Not Currently     Types: Marijuana, Cocaine, IV    Comment: Heroin - one month ago   Sexual activity: Not on file  Other Topics Concern   Not on file  Social History Narrative   Single.  Lives alone.     Social Determinants of Health   Financial Resource Strain: Not on file  Food Insecurity: No Food Insecurity (03/03/2023)   Hunger Vital Sign    Worried About Running Out of Food in the Last Year: Never true    Ran Out of Food in the Last Year: Never true  Transportation Needs: No Transportation Needs (03/03/2023)   PRAPARE - Hydrologist (Medical): No    Lack of Transportation (Non-Medical): No  Physical Activity: Not on file  Stress: Not on file  Social Connections: Not on file     Family History: The patient's family history includes CAD in his father; Diabetes in his brother and sister; Hypertension in his father and mother.  ROS:   Review of Systems  Constitution: Negative for decreased appetite, fever and weight gain.  HENT: Negative for congestion, ear discharge, hoarse voice and sore throat.   Eyes: Negative for discharge, redness, vision loss in right eye and visual halos.  Cardiovascular: Negative for chest pain, dyspnea on exertion, leg swelling, orthopnea and palpitations.  Respiratory: Negative for cough, hemoptysis, shortness of breath and snoring.   Endocrine: Negative for heat intolerance and polyphagia.  Hematologic/Lymphatic: Negative for bleeding problem. Does not bruise/bleed easily.  Skin: Negative for flushing, nail changes, rash and suspicious lesions.  Musculoskeletal: Negative for arthritis, joint pain, muscle cramps, myalgias, neck pain and stiffness.  Gastrointestinal: Negative for abdominal pain, bowel incontinence, diarrhea and excessive appetite.  Genitourinary: Negative for decreased libido, genital sores and incomplete emptying.  Neurological: Negative for brief paralysis, focal weakness, headaches and loss of balance.   Psychiatric/Behavioral: Negative for altered mental status, depression and suicidal ideas.  Allergic/Immunologic: Negative for HIV exposure and persistent infections.    EKGs/Labs/Other Studies Reviewed:    The following studies were reviewed today:   EKG:  None today  TTE 03/02/2023 IMPRESSIONS     1. LV systolic function is vigorous with near cavity obliteration during  systole. Turbulent flow through LVOT consistent with HOCM physiology Peak  gradient through LVOT approximately 100 mm Hg. Marland Kitchen Left ventricular ejection  fraction, by estimation, is 70  to 75%. The left ventricle has hyperdynamic function. There is mild left  ventricular hypertrophy. Left ventricular diastolic parameters were  normal.   2. Right ventricular systolic function is normal. The right ventricular  size is normal.   3. A small pericardial effusion is present.   4. Systolic  anterior motion of the anterior mitral leaflet. . Mild mitral  valve regurgitation.   5. The aortic valve is tricuspid. Aortic valve regurgitation is mild to  moderate. Aortic valve sclerosis/calcification is present, without any  evidence of aortic stenosis.   FINDINGS   Left Ventricle: LV systolic function is vigorous with near cavity  obliteration during systole. Turbulent flow through LVOT consistent with  HOCM physiology Peak gradient through LVOT approximately 100 mm Hg. Left  ventricular ejection fraction, by  estimation, is 70 to 75%. The left ventricle has hyperdynamic function.  The left ventricular internal cavity size was normal in size. There is  mild left ventricular hypertrophy. Left ventricular diastolic parameters  were normal.   Right Ventricle: The right ventricular size is normal. Right vetricular  wall thickness was not assessed. Right ventricular systolic function is  normal.   Left Atrium: Left atrial size was normal in size.   Right Atrium: Right atrial size was normal in size.   Pericardium: A small  pericardial effusion is present.   Mitral Valve: Systolic anterior motion of the anterior mitral leaflet.  There is mild thickening of the mitral valve leaflet(s). Mild mitral valve  regurgitation.   Tricuspid Valve: The tricuspid valve is normal in structure. Tricuspid  valve regurgitation is trivial.   Aortic Valve: The aortic valve is tricuspid. Aortic valve regurgitation is  mild to moderate. Aortic regurgitation PHT measures 587 msec. Aortic valve  sclerosis/calcification is present, without any evidence of aortic  stenosis.   Pulmonic Valve: The pulmonic valve was not well visualized. Pulmonic valve  regurgitation is not visualized.   Aorta: The aortic root and ascending aorta are structurally normal, with  no evidence of dilitation.   IAS/Shunts: No atrial level shunt detected by color flow Doppler.    Cardiac MR 03/04/2023 FINDINGS: Limited images of the lung fields showed no gross abnormalities.   Normal left ventricular size with moderate asymmetric focal basal septal hypertrophy (1.7 cm basal septum, 0.8 cm basal inferolateral wall). Mild global hypokinesis, EF 49%. Normal right ventricular size and systolic function, [id number]. Mild left atrial enlargement. Normal right atrium. Trileaflet aortic valve, no stenosis but moderate regurgitation (regurgitant fraction 28%). I do not see significant systolic anterior motion of the mitral valve on this study, moderate mitral regurgitation with regurgitant fraction 21%.   On delayed enhancement imaging, there was a small area of thinning and full thickness late gadolinium enhancement near the true apex.   MEASUREMENTS: MEASUREMENTS LVEDV 186 mL   LVEDVi 104 mL/m2 LVSV 92 mL   LVEF 49%   RVEDV 117 mL RVEDVi 65 mL/m2 RVSV 67 mL   RVEF 57%   Aortic forward volume 73 mL   Aortic regurgitant fraction 28%   T1 1087, ECV 29%   IMPRESSION: 1. Normal left ventricular size with moderate asymmetric focal basal septal  hypertrophy. Mild global hypokinesis with EF 49%.   2.  Normal RV size and systolic function, [id number].   3. There is moderate mitral regurgitation (regurgitant fraction 21%) but I do not see systolic anterior motion of the mitral valve.   4.  Moderate aortic insufficiency with regurgitant fraction 28%.   5. Small area of thinning and transmural LGE near the true apex. This is < 15% of the LV myocardium.   This is possibly a variant of hypertrophic cardiomyopathy.   [name]     Electronically Signed   By: Loralie Champagne M.D.   On: 03/04/2023 18:27  Recent Labs: 03/05/2023:  ALT 23; B Natriuretic Peptide 124.3; BUN 27; Creatinine, Ser 1.10; Hemoglobin 12.2; Magnesium 2.2; Platelets 311; Potassium 3.6; Sodium 138  Recent Lipid Panel    Component Value Date/Time   CHOL 207 (H) 03/02/2023 0801   TRIG 98 03/02/2023 0801   HDL 35 (L) 03/02/2023 0801   CHOLHDL 5.9 03/02/2023 0801   VLDL 20 03/02/2023 0801   LDLCALC 152 (H) 03/02/2023 0801    Physical Exam:    VS:  BP (!) 190/60   Pulse 78   Ht 5\' 9"  (1.753 m)   Wt 69.9 kg   SpO2 96%   BMI 22.74 kg/m     Wt Readings from Last 3 Encounters:  03/16/23 69.9 kg  03/05/23 66.4 kg  12/14/22 74.8 kg     GEN: Well nourished, well developed in no acute distress HEENT: Normal NECK: No JVD; No carotid bruits LYMPHATICS: No lymphadenopathy CARDIAC: S1S2 noted,RRR, no murmurs, rubs, gallops RESPIRATORY:  Clear to auscultation without rales, wheezing or rhonchi  ABDOMEN: Soft, non-tender, non-distended, +bowel sounds, no guarding. EXTREMITIES: No edema, No cyanosis, no clubbing MUSCULOSKELETAL:  No deformity  SKIN: Warm and dry NEUROLOGIC:  Alert and oriented x 3, non-focal PSYCHIATRIC:  Normal affect, good insight  ASSESSMENT:    1. Primary hypertension   2. Acute on chronic diastolic CHF (congestive heart failure) ([place])   3. 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[place])   4. Type 2 diabetes mellitus without  complication, without long-term current use of insulin ([place])   5. Mixed hyperlipidemia due to type 2 diabetes mellitus ([place])    PLAN:    He is hypertensive in the office today. He had not taken his medications prior to the visit. It is hard to tell if his home bp is within target. He does not have a bp cuff he says he can get one. We were able to give that patient me pill organizer.   I am going to follow him closely because I need to understand if her is symptomatic interm of his HCM on this medication regimen. Because if he does it will be a great idea to get a treadmill stress test to understand LVOT gradients as well as refer him the the HCM clinic.   He is clinically euvolemic.   The patient is in agreement with the above plan. The patient left the office in stable condition.  The patient will follow up in 3 months.    Medication Adjustments/Labs and Tests Ordered: Current medicines are reviewed at length with the patient today.  Concerns regarding medicines are outlined above.  No orders of the defined types were placed in this encounter.  No orders of the defined types were placed in this encounter.   Patient Instructions  Medication Instructions:  Your physician recommends that you continue on your current medications as directed. Please refer to the Current Medication list given to you today.  *If you need a refill on your cardiac medications before your next appointment, please call your pharmacy*   Lab Work: None   Testing/Procedures: None   Follow-Up: At [facility], you and your health needs are our priority.  As part of our continuing mission to provide you with exceptional heart care, we have created designated Provider Care Teams.  These Care Teams include your primary Cardiologist (physician) and Advanced Practice Providers (APPs -  Physician Assistants and Nurse Practitioners) who all work together to provide you with the care you need, when you need  it.  We recommend signing  up for the patient portal called "MyChart".  Sign up information is provided on this After Visit Summary.  MyChart is used to connect with patients for Virtual Visits (Telemedicine).  Patients are able to view lab/test results, encounter notes, upcoming appointments, etc.  Non-urgent messages can be sent to your provider as well.   To learn more about what you can do with MyChart, go to [email or web address].    Your next appointment:   3 month(s)  Provider:   Berniece Salines, DO    Adopting a Healthy Lifestyle.  Know what a healthy weight is for you (roughly BMI <25) and aim to maintain this   Aim for 7+ servings of fruits and vegetables daily   65-80+ fluid ounces of water or unsweet tea for healthy kidneys   Limit to max 1 drink of alcohol per day; avoid smoking/tobacco   Limit animal fats in diet for cholesterol and heart health - choose grass fed whenever available   Avoid highly processed foods, and foods high in saturated/trans fats   Aim for low stress - take time to unwind and care for your mental health   Aim for 150 min of moderate intensity exercise weekly for heart health, and weights twice weekly for bone health   Aim for 7-9 hours of sleep daily   When it comes to diets, agreement about the perfect plan isnt easy to find, even among the experts. Experts at the [employer] developed an idea known as the Healthy Eating Plate. Just imagine a plate divided into logical, healthy portions.   The emphasis is on diet quality:   Load up on vegetables and fruits - one-half of your plate: Aim for color and variety, and remember that potatoes dont count.   Go for whole grains - one-quarter of your plate: Whole wheat, barley, wheat berries, quinoa, oats, brown rice, and foods made with them. If you want pasta, go with whole wheat pasta.   Protein power - one-quarter of your plate: Fish, chicken, beans, and nuts are all healthy,  versatile protein sources. Limit red meat.   The diet, however, does go beyond the plate, offering a few other suggestions.   Use healthy plant oils, such as olive, canola, soy, corn, sunflower and peanut. Check the labels, and avoid partially hydrogenated oil, which have unhealthy trans fats.   If youre thirsty, drink water. Coffee and tea are good in moderation, but skip sugary drinks and limit milk and dairy products to one or two daily servings.   The type of carbohydrate in the diet is more important than the amount. Some sources of carbohydrates, such as vegetables, fruits, whole grains, and beans-are healthier than others.   Finally, stay active  Signed, Berniece Salines, DO  03/18/2023 5:29 PM    [facility]

## 2023-04-09 ENCOUNTER — Other Ambulatory Visit: Payer: Self-pay | Admitting: Internal Medicine

## 2023-04-09 DIAGNOSIS — N139 Obstructive and reflux uropathy, unspecified: Secondary | ICD-10-CM | POA: Diagnosis not present

## 2023-04-09 DIAGNOSIS — I10 Essential (primary) hypertension: Secondary | ICD-10-CM | POA: Diagnosis not present

## 2023-04-09 DIAGNOSIS — E1142 Type 2 diabetes mellitus with diabetic polyneuropathy: Secondary | ICD-10-CM | POA: Diagnosis not present

## 2023-04-11 LAB — URINE CULTURE
MICRO NUMBER:: 14812603
SPECIMEN QUALITY:: ADEQUATE

## 2023-04-23 DIAGNOSIS — N139 Obstructive and reflux uropathy, unspecified: Secondary | ICD-10-CM | POA: Diagnosis not present

## 2023-06-04 DIAGNOSIS — I10 Essential (primary) hypertension: Secondary | ICD-10-CM | POA: Diagnosis not present

## 2023-06-04 DIAGNOSIS — I5032 Chronic diastolic (congestive) heart failure: Secondary | ICD-10-CM | POA: Diagnosis not present

## 2023-06-04 DIAGNOSIS — N139 Obstructive and reflux uropathy, unspecified: Secondary | ICD-10-CM | POA: Diagnosis not present

## 2023-06-04 DIAGNOSIS — E119 Type 2 diabetes mellitus without complications: Secondary | ICD-10-CM | POA: Diagnosis not present

## 2023-06-04 DIAGNOSIS — M5136 Other intervertebral disc degeneration, lumbar region: Secondary | ICD-10-CM | POA: Diagnosis not present

## 2023-06-16 ENCOUNTER — Other Ambulatory Visit: Payer: Self-pay | Admitting: Internal Medicine

## 2023-06-16 DIAGNOSIS — M5136 Other intervertebral disc degeneration, lumbar region: Secondary | ICD-10-CM | POA: Diagnosis not present

## 2023-06-16 DIAGNOSIS — I10 Essential (primary) hypertension: Secondary | ICD-10-CM | POA: Diagnosis not present

## 2023-06-16 DIAGNOSIS — N139 Obstructive and reflux uropathy, unspecified: Secondary | ICD-10-CM | POA: Diagnosis not present

## 2023-06-16 DIAGNOSIS — E1142 Type 2 diabetes mellitus with diabetic polyneuropathy: Secondary | ICD-10-CM | POA: Diagnosis not present

## 2023-06-18 ENCOUNTER — Encounter (HOSPITAL_COMMUNITY): Payer: Self-pay | Admitting: Internal Medicine

## 2023-06-18 ENCOUNTER — Ambulatory Visit (HOSPITAL_COMMUNITY)
Admission: RE | Admit: 2023-06-18 | Discharge: 2023-06-18 | Disposition: A | Discharge status: Home / Self Care | Payer: Medicare HMO | Source: Ambulatory Visit | Attending: Vascular Surgery | Admitting: Vascular Surgery

## 2023-06-18 ENCOUNTER — Other Ambulatory Visit (HOSPITAL_COMMUNITY): Payer: Self-pay | Admitting: Internal Medicine

## 2023-06-18 DIAGNOSIS — R52 Pain, unspecified: Secondary | ICD-10-CM | POA: Insufficient documentation

## 2023-06-18 LAB — URINE CULTURE
MICRO NUMBER:: 15096786
SPECIMEN QUALITY:: ADEQUATE

## 2023-06-19 LAB — VAS US ABI WITH/WO TBI
Left ABI: 0.46
Right ABI: 0.51

## 2023-06-22 ENCOUNTER — Ambulatory Visit: Payer: Medicare HMO | Attending: Cardiology | Admitting: Cardiology

## 2023-06-22 ENCOUNTER — Telehealth: Payer: Self-pay

## 2023-06-22 NOTE — Telephone Encounter (Signed)
Called pt. Pt will come in at 1 today.

## 2023-07-16 ENCOUNTER — Other Ambulatory Visit: Payer: Self-pay | Admitting: Internal Medicine

## 2023-07-18 LAB — URINE CULTURE
MICRO NUMBER:: 15217723
SPECIMEN QUALITY:: ADEQUATE

## 2023-07-19 ENCOUNTER — Encounter (HOSPITAL_COMMUNITY): Payer: Self-pay

## 2023-07-19 ENCOUNTER — Emergency Department (HOSPITAL_COMMUNITY): Payer: Medicare HMO

## 2023-07-19 ENCOUNTER — Emergency Department (HOSPITAL_COMMUNITY)
Admission: EM | Admit: 2023-07-19 | Discharge: 2023-07-19 | Disposition: A | Discharge status: Home / Self Care | Payer: Medicare HMO

## 2023-07-19 DIAGNOSIS — Z79899 Other long term (current) drug therapy: Secondary | ICD-10-CM | POA: Insufficient documentation

## 2023-07-19 DIAGNOSIS — R300 Dysuria: Secondary | ICD-10-CM | POA: Diagnosis not present

## 2023-07-19 DIAGNOSIS — D72829 Elevated white blood cell count, unspecified: Secondary | ICD-10-CM | POA: Insufficient documentation

## 2023-07-19 DIAGNOSIS — Z7982 Long term (current) use of aspirin: Secondary | ICD-10-CM | POA: Diagnosis not present

## 2023-07-19 DIAGNOSIS — R1084 Generalized abdominal pain: Secondary | ICD-10-CM | POA: Insufficient documentation

## 2023-07-19 DIAGNOSIS — I159 Secondary hypertension, unspecified: Secondary | ICD-10-CM | POA: Diagnosis not present

## 2023-07-19 DIAGNOSIS — R109 Unspecified abdominal pain: Secondary | ICD-10-CM

## 2023-07-19 DIAGNOSIS — Z7984 Long term (current) use of oral hypoglycemic drugs: Secondary | ICD-10-CM | POA: Insufficient documentation

## 2023-07-19 LAB — URINALYSIS, ROUTINE W REFLEX MICROSCOPIC
Bilirubin Urine: NEGATIVE
Glucose, UA: NEGATIVE mg/dL
Ketones, ur: 5 mg/dL — AB
Nitrite: POSITIVE — AB
Protein, ur: 100 mg/dL — AB
Specific Gravity, Urine: 1.013 (ref 1.005–1.030)
WBC, UA: >50 WBC/hpf (ref 0–5)
pH: 5 (ref 5.0–8.0)

## 2023-07-19 LAB — CBC WITH DIFFERENTIAL/PLATELET
Abs Immature Granulocytes: 0.07 10*3/uL (ref 0.00–0.07)
Basophils Absolute: 0.1 10*3/uL (ref 0.0–0.1)
Basophils Relative: 0 %
Eosinophils Absolute: 0.5 10*3/uL (ref 0.0–0.5)
Eosinophils Relative: 4 %
HCT: 45 % (ref 39.0–52.0)
Hemoglobin: 14.1 g/dL (ref 13.0–17.0)
Immature Granulocytes: 1 %
Lymphocytes Relative: 17 %
Lymphs Abs: 2.4 10*3/uL (ref 0.7–4.0)
MCH: 23 pg — ABNORMAL LOW (ref 26.0–34.0)
MCHC: 31.3 g/dL (ref 30.0–36.0)
MCV: 73.4 fL — ABNORMAL LOW (ref 80.0–100.0)
Monocytes Absolute: 0.7 10*3/uL (ref 0.1–1.0)
Monocytes Relative: 5 %
Neutro Abs: 10.1 10*3/uL — ABNORMAL HIGH (ref 1.7–7.7)
Neutrophils Relative %: 73 %
Platelets: 313 10*3/uL (ref 150–400)
RBC: 6.13 MIL/uL — ABNORMAL HIGH (ref 4.22–5.81)
RDW: 14.6 % (ref 11.5–15.5)
WBC: 13.9 10*3/uL — ABNORMAL HIGH (ref 4.0–10.5)
nRBC: 0 % (ref 0.0–0.2)

## 2023-07-19 LAB — COMPREHENSIVE METABOLIC PANEL
ALT: 21 U/L (ref 0–44)
AST: 24 U/L (ref 15–41)
Albumin: 4 g/dL (ref 3.5–5.0)
Alkaline Phosphatase: 64 U/L (ref 38–126)
Anion gap: 13 (ref 5–15)
BUN: 30 mg/dL — ABNORMAL HIGH (ref 8–23)
CO2: 19 mmol/L — ABNORMAL LOW (ref 22–32)
Calcium: 9.4 mg/dL (ref 8.9–10.3)
Chloride: 105 mmol/L (ref 98–111)
Creatinine, Ser: 1.11 mg/dL (ref 0.61–1.24)
GFR, Estimated: >60 mL/min (ref >60)
Glucose, Bld: 124 mg/dL — ABNORMAL HIGH (ref 70–99)
Potassium: 3.8 mmol/L (ref 3.5–5.1)
Sodium: 137 mmol/L (ref 135–145)
Total Bilirubin: 0.9 mg/dL (ref 0.3–1.2)
Total Protein: 8.3 g/dL — ABNORMAL HIGH (ref 6.5–8.1)

## 2023-07-19 LAB — LIPASE, BLOOD: Lipase: 42 U/L (ref 11–51)

## 2023-07-19 MED ORDER — CEPHALEXIN 500 MG PO CAPS: 500.0000 mg | ORAL_CAPSULE | Freq: Four times a day (QID) | ORAL | 0 refills | Status: AC

## 2023-07-19 MED ORDER — ONDANSETRON 4 MG PO TBDP
4.0000 mg | ORAL_TABLET | Freq: Once | ORAL | Status: AC
  Administered 2023-07-19: 4 mg via ORAL
  Filled 2023-07-19: qty 1

## 2023-07-19 MED ORDER — HYDRALAZINE HCL 25 MG PO TABS
100.0000 mg | ORAL_TABLET | Freq: Once | ORAL | Status: AC
  Administered 2023-07-19: 100 mg via ORAL
  Filled 2023-07-19: qty 4

## 2023-07-19 MED ORDER — OXYCODONE-ACETAMINOPHEN 5-325 MG PO TABS
1.0000 | ORAL_TABLET | Freq: Once | ORAL | Status: AC
  Administered 2023-07-19: 1 via ORAL
  Filled 2023-07-19: qty 1

## 2023-07-19 MED ORDER — MORPHINE SULFATE (PF) 2 MG/ML IV SOLN
2.0000 mg | Freq: Once | INTRAVENOUS | Status: AC
  Administered 2023-07-19: 2 mg via INTRAVENOUS
  Filled 2023-07-19: qty 1

## 2023-07-19 MED ORDER — SODIUM CHLORIDE 0.9 % IV SOLN
1.0000 g | Freq: Once | INTRAVENOUS | Status: AC
  Administered 2023-07-19: 1 g via INTRAVENOUS
  Filled 2023-07-19: qty 10

## 2023-07-19 MED ORDER — IOHEXOL 300 MG/ML  SOLN
100.0000 mL | Freq: Once | INTRAMUSCULAR | Status: AC | PRN
  Administered 2023-07-19: 100 mL via INTRAVENOUS

## 2023-07-19 MED ORDER — CEPHALEXIN 500 MG PO CAPS: 500.0000 mg | ORAL_CAPSULE | Freq: Four times a day (QID) | ORAL | 0 refills | Status: DC

## 2023-07-19 MED ORDER — SODIUM CHLORIDE (PF) 0.9 % IJ SOLN
INTRAMUSCULAR | Status: AC
  Filled 2023-07-19: qty 50

## 2023-07-19 NOTE — Progress Notes (Deleted)
[facility] OF [place]  ASSESSMENT / PLAN: 72 y.o. male with *** - ***  CHIEF COMPLAINT: ***  HISTORY OF PRESENT ILLNESS: [name] is a 72 y.o. male ***  VASCULAR SURGICAL HISTORY: ***  VASCULAR RISK FACTORS: {FINDINGS; POSITIVE NEGATIVE:[phone]} history of stroke / transient ischemic attack. {FINDINGS; POSITIVE NEGATIVE:[phone]} history of coronary artery disease. *** history of PCI. *** history of CABG.  {FINDINGS; POSITIVE NEGATIVE:[phone]} history of diabetes mellitus. Last A1c ***. {FINDINGS; POSITIVE NEGATIVE:[phone]} history of smoking. *** actively smoking. {FINDINGS; POSITIVE NEGATIVE:[phone]} history of hypertension. *** drug regimen with *** control. {FINDINGS; POSITIVE NEGATIVE:[phone]} history of chronic kidney disease.  Last GFR ***. CKD {stage:30421363}. {FINDINGS; POSITIVE NEGATIVE:[phone]} history of chronic obstructive pulmonary disease, treated with ***.  FUNCTIONAL STATUS: ECOG performance status: {findings; ecog performance status:31780} Ambulatory status: {TNHAmbulation:25868}  CAREY 1 AND 3 YEAR INDEX Male (2pts) 75-79 or 80-84 (2pts) >84 (3pts) Dependence in toileting (1pt) Partial or full dependence in dressing (1pt) History of malignant neoplasm (2pts) CHF (3pts) COPD (1pts) CKD (3pts)  0-3 pts 6% 1 year mortality ; 21% 3 year mortality 4-5 pts 12% 1 year mortality ; 36% 3 year mortality >5 pts 21% 1 year mortality; 54% 3 year mortality   Past Medical History:  Diagnosis Date   Alcohol dependence (HCC) 12/10/2013   Angioedema of lips 12/24/2013   Brain tumor (HCC)    CHF (congestive heart failure) (HCC)    diastolic   Chronic back pain    Chronic headache    CVA (cerebral vascular accident) (HCC)    Diabetes mellitus    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HCC) 02/18/2020   HTN (hypertension), malignant 09/06/2012   Hypertension    Narcotic abuse (HCC)    Normal cardiac stress  test 2009   TIA (transient ischemic attack) 09/06/2012   Tobacco use disorder 12/10/2013   UTI (urinary tract infection)    Vertigo 09/06/2012    Past Surgical History:  Procedure Laterality Date   NO PAST SURGERIES      Family History  Problem Relation Age of Onset   CAD Father        "heart attack in his 21's"   Hypertension Father    Diabetes Brother    Diabetes Sister    Hypertension Mother     Social History   Socioeconomic History   Marital status: Legally Separated    Spouse name: Not on file   Number of children: Not on file   Years of education: Not on file   Highest education level: Not on file  Occupational History   Not on file  Tobacco Use   Smoking status: Every Day    Current packs/day: 0.50    Types: Cigarettes   Smokeless tobacco: Current  Vaping Use   Vaping status: Never Used  Substance and Sexual Activity   Alcohol use: Yes   Drug use: Not Currently    Types: Marijuana, Cocaine, IV    Comment: Heroin - one month ago   Sexual activity: Not on file  Other Topics Concern   Not on file  Social History Narrative   Single.  Lives alone.     Social Determinants of Health   Financial Resource Strain: Not on file  Food Insecurity: No Food Insecurity (03/03/2023)   Hunger Vital Sign    Worried About Running Out of Food in the Last Year: Never true    Ran Out of Food in the Last Year: Never true  Transportation Needs: No Transportation  Needs (03/03/2023)   PRAPARE - Administrator, Civil Service (Medical): No    Lack of Transportation (Non-Medical): No  Physical Activity: Not on file  Stress: Not on file  Social Connections: Not on file  Intimate Partner Violence: Not At Risk (03/03/2023)   Humiliation, Afraid, Rape, and Kick questionnaire    Fear of Current or Ex-Partner: No    Emotionally Abused: No    Physically Abused: No    Sexually Abused: No    No Known Allergies  Current Outpatient Medications  Medication Sig Dispense Refill    albuterol (PROVENTIL HFA;VENTOLIN HFA) 108 (90 Base) MCG/ACT inhaler Inhale 1-2 puffs into the lungs every 6 (six) hours as needed for wheezing. 1 Inhaler 0   aspirin EC 81 MG tablet Take 81 mg by mouth every morning.     atorvastatin (LIPITOR) 40 MG tablet Take 1 tablet (40 mg total) by mouth daily. 30 tablet 2   carvedilol (COREG) 25 MG tablet Take 1 tablet (25 mg total) by mouth 2 (two) times daily with a meal. 60 tablet 2   diclofenac Sodium (VOLTAREN) 1 % GEL Apply 1 application. topically 4 (four) times daily as needed (pain).     hydrALAZINE (APRESOLINE) 100 MG tablet Take 1 tablet (100 mg total) by mouth every 8 (eight) hours. 90 tablet 2   metFORMIN (GLUCOPHAGE) 500 MG tablet Take 1 tablet by mouth twice a day (Patient taking differently: Take 500 mg by mouth 2 (two) times daily.) 180 tablet 2   nicotine (NICODERM CQ - DOSED IN MG/24 HOURS) 21 mg/24hr patch Place 1 patch (21 mg total) onto the skin daily. (Patient taking differently: Place 21 mg onto the skin daily as needed (smoking cessation).) 28 patch 0   omeprazole (PRILOSEC) 20 MG capsule Take 1 capsule (20 mg total) by mouth daily. 30 capsule 0   ondansetron (ZOFRAN-ODT) 8 MG disintegrating tablet Take 1 tablet (8 mg total) by mouth every 8 (eight) hours as needed for nausea or vomiting. 20 tablet 0   sucralfate (CARAFATE) 1 GM/10ML suspension Take 10 mLs (1 g total) by mouth 4 (four) times daily -  with meals and at bedtime. 420 mL 0   tamsulosin (FLOMAX) 0.4 MG CAPS capsule TAKE TWO CAPSULES BY MOUTH AT BEDTIME 60 capsule 2   traMADol (ULTRAM) 50 MG tablet Take 1 tablet by mouth twice a day as needed for severe pains 10 tablet 0   verapamil (CALAN-SR) 180 MG CR tablet Take 2 tablets (360 mg total) by mouth daily. 60 tablet 2   No current facility-administered medications for this visit.    PHYSICAL EXAM There were no vitals filed for this visit.  Constitutional: *** appearing. *** distress. Appears *** nourished.   Neurologic: CN ***. *** focal findings. *** sensory loss. Psychiatric: *** Mood and affect symmetric and appropriate. Eyes: *** No icterus. No conjunctival pallor. Ears, nose, throat: *** mucous membranes moist. Midline trachea.  Cardiac: *** rate and rhythm.  Respiratory: *** unlabored. Abdominal: *** soft, non-tender, non-distended.  Peripheral vascular: *** Extremity: *** edema. *** cyanosis. *** pallor.  Skin: *** gangrene. *** ulceration.  Lymphatic: *** Stemmer's sign. *** palpable lymphadenopathy.    PERTINENT LABORATORY AND RADIOLOGIC DATA  Most recent CBC    Latest Ref Rng & Units 03/05/2023    3:03 AM 03/04/2023    2:50 AM 03/03/2023    7:49 AM  CBC  WBC 4.0 - 10.5 K/uL 12.1  14.8  13.8   Hemoglobin 13.0 -  17.0 g/dL 82.9  56.2  13.0   Hematocrit 39.0 - 52.0 % 39.8  42.1  37.6   Platelets 150 - 400 K/uL 311  286  310      Most recent CMP    Latest Ref Rng & Units 03/05/2023    3:03 AM 03/04/2023    2:50 AM 03/03/2023    7:49 AM  CMP  Glucose 70 - 99 mg/dL 865  784  696   BUN 8 - 23 mg/dL 27  29  25    Creatinine 0.61 - 1.24 mg/dL 2.95  2.84  1.32   Sodium 135 - 145 mmol/L 138  141  140   Potassium 3.5 - 5.1 mmol/L 3.6  4.0  3.2   Chloride 98 - 111 mmol/L 107  110  107   CO2 22 - 32 mmol/L 23  22  23    Calcium 8.9 - 10.3 mg/dL 8.9  9.3  8.9   Total Protein 6.5 - 8.1 g/dL 7.0     Total Bilirubin 0.3 - 1.2 mg/dL 0.8     Alkaline Phos 38 - 126 U/L 45     AST 15 - 41 U/L 37     ALT 0 - 44 U/L 23       Renal function CrCl cannot be calculated (Patient's most recent lab result is older than the maximum 21 days allowed.).  Hgb A1c MFr Bld (%)  Date Value  03/01/2023 6.2 (H)    LDL Cholesterol  Date Value Ref Range Status  03/02/2023 152 (H) 0 - 99 mg/dL Final    Comment:           Total Cholesterol/HDL:CHD Risk Coronary Heart Disease Risk Table                     Men   Women  1/2 Average Risk   3.4   3.3  Average Risk       5.0   4.4  2 X Average Risk   9.6    7.1  3 X Average Risk  23.4   11.0        Use the calculated Patient Ratio above and the CHD Risk Table to determine the patient's CHD Risk.        ATP III CLASSIFICATION (LDL):  <100     mg/dL   Optimal  440-102  mg/dL   Near or Above                    Optimal  130-159  mg/dL   Borderline  725-366  mg/dL   High  >440     mg/dL   Very High Performed at [facility], [address] [place], [place] [address]      Vascular Imaging:   +-------+-----------+-----------+------------+------------+  ABI/TBIToday's ABIToday's TBIPrevious ABIPrevious TBI  +-------+-----------+-----------+------------+------------+  Right 0.51       0.24                                 +-------+-----------+-----------+------------+------------+  Left  0.46       0.21                                 +-------+-----------+-----------+------------+------------+   [name]. Lenell Antu, MD [facility] of [facility] Phone Number: [phone] 07/19/2023 2:21 PM  Total time spent on preparing this encounter including chart review, data review, collecting history, examining the patient, coordinating care for this {tnhtimebilling:26202}  Portions of this report may have been transcribed using voice recognition software.  Every effort has been made to ensure accuracy; however, inadvertent computerized transcription errors may still be present.

## 2023-07-19 NOTE — ED Triage Notes (Signed)
Pt arrived via POV, c/o abd pain and concern for UTI. States some retention and dysuria.

## 2023-07-19 NOTE — ED Provider Notes (Signed)
[place] EMERGENCY DEPARTMENT AT [facility] Provider Note   CSN: [id number] Arrival date & time: 07/19/23  1255     History  Chief Complaint  Patient presents with   Abdominal Pain    [name] is a 72 y.o. male.  This is a 72 year old male present emergency department with progressive 4-day worsening lower abdominal pain associated with dysuria.  Reports spasm type sensation intermittently.  He also notes painful urination.  No hematuria.  No fevers, chest pain, shortness of breath.  No nausea no vomiting.  Reports history of urinary retention secondary to prostate issues and prior Foley placement.   Abdominal Pain      Home Medications Prior to Admission medications   Medication Sig Start Date End Date Taking? Authorizing Provider  albuterol (PROVENTIL HFA;VENTOLIN HFA) 108 (90 Base) MCG/ACT inhaler Inhale 1-2 puffs into the lungs every 6 (six) hours as needed for wheezing. 09/04/16   Rolland Porter, MD  aspirin EC 81 MG tablet Take 81 mg by mouth every morning.    [provider]  atorvastatin (LIPITOR) 40 MG tablet Take 1 tablet (40 mg total) by mouth daily. 03/06/23   Shalhoub, Deno Lunger, MD  carvedilol (COREG) 25 MG tablet Take 1 tablet (25 mg total) by mouth 2 (two) times daily with a meal. 03/05/23   Shalhoub, Deno Lunger, MD  cephALEXin (KEFLEX) 500 MG capsule Take 1 capsule (500 mg total) by mouth 4 (four) times daily for 7 days. 07/19/23 07/26/23  Coral Spikes, DO  diclofenac Sodium (VOLTAREN) 1 % GEL Apply 1 application. topically 4 (four) times daily as needed (pain). 12/27/21   [provider]  hydrALAZINE (APRESOLINE) 100 MG tablet Take 1 tablet (100 mg total) by mouth every 8 (eight) hours. 03/05/23   Shalhoub, Deno Lunger, MD  metFORMIN (GLUCOPHAGE) 500 MG tablet Take 1 tablet by mouth twice a day Patient taking differently: Take 500 mg by mouth 2 (two) times daily. 06/04/21     nicotine (NICODERM CQ - DOSED IN MG/24 HOURS) 21 mg/24hr patch  Place 1 patch (21 mg total) onto the skin daily. Patient taking differently: Place 21 mg onto the skin daily as needed (smoking cessation). 02/21/20   Rodolph Bong, MD  omeprazole (PRILOSEC) 20 MG capsule Take 1 capsule (20 mg total) by mouth daily. 12/14/22   Arby Barrette, MD  ondansetron (ZOFRAN-ODT) 8 MG disintegrating tablet Take 1 tablet (8 mg total) by mouth every 8 (eight) hours as needed for nausea or vomiting. 08/19/22   Margarita Grizzle, MD  sucralfate (CARAFATE) 1 GM/10ML suspension Take 10 mLs (1 g total) by mouth 4 (four) times daily -  with meals and at bedtime. 12/14/22   Arby Barrette, MD  tamsulosin (FLOMAX) 0.4 MG CAPS capsule TAKE TWO CAPSULES BY MOUTH AT BEDTIME 03/05/23   Shalhoub, Deno Lunger, MD  traMADol (ULTRAM) 50 MG tablet Take 1 tablet by mouth twice a day as needed for severe pains 06/04/21     verapamil (CALAN-SR) 180 MG CR tablet Take 2 tablets (360 mg total) by mouth daily. 03/06/23   Shalhoub, Deno Lunger, MD      Allergies    Patient has no known allergies.    Review of Systems   Review of Systems  Gastrointestinal:  Positive for abdominal pain.    Physical Exam Updated Vital Signs BP (!) 224/58   Pulse 65   Temp 99.6 F (37.6 C) (Oral)   Resp 16   SpO2 94%  Physical  Exam Vitals and nursing note reviewed.  Constitutional:      General: He is not in acute distress.    Appearance: He is not toxic-appearing.     Comments: Uncomfortable appearing.  HENT:     Head: Normocephalic.  Cardiovascular:     Rate and Rhythm: Normal rate and regular rhythm.  Pulmonary:     Effort: Pulmonary effort is normal.  Abdominal:     General: Abdomen is flat.     Tenderness: There is generalized abdominal tenderness and tenderness in the suprapubic area. There is guarding (voluntary).  Skin:    General: Skin is warm and dry.     Capillary Refill: Capillary refill takes less than 2 seconds.  Neurological:     Mental Status: He is alert and oriented to person, place,  and time.  Psychiatric:        Mood and Affect: Mood normal.        Behavior: Behavior normal.     ED Results / Procedures / Treatments   Labs (all labs ordered are listed, but only abnormal results are displayed) Labs Reviewed  COMPREHENSIVE METABOLIC PANEL - Abnormal; Notable for the following components:      Result Value   CO2 19 (*)    Glucose, Bld 124 (*)    BUN 30 (*)    Total Protein 8.3 (*)    All other components within normal limits  CBC WITH DIFFERENTIAL/PLATELET - Abnormal; Notable for the following components:   WBC 13.9 (*)    RBC 6.13 (*)    MCV 73.4 (*)    MCH 23.0 (*)    Neutro Abs 10.1 (*)    All other components within normal limits  URINALYSIS, ROUTINE W REFLEX MICROSCOPIC - Abnormal; Notable for the following components:   Color, Urine AMBER (*)    APPearance CLOUDY (*)    Hgb urine dipstick MODERATE (*)    Ketones, ur 5 (*)    Protein, ur 100 (*)    Nitrite POSITIVE (*)    Leukocytes,Ua MODERATE (*)    Bacteria, UA RARE (*)    All other components within normal limits  LIPASE, BLOOD    EKG None  Radiology CT ABDOMEN PELVIS W CONTRAST  Result Date: 07/19/2023 CLINICAL DATA:  Bowel obstruction suspected EXAM: CT ABDOMEN AND PELVIS WITH CONTRAST TECHNIQUE: Multidetector CT imaging of the abdomen and pelvis was performed using the standard protocol following bolus administration of intravenous contrast. RADIATION DOSE REDUCTION: This exam was performed according to the departmental dose-optimization program which includes automated exposure control, adjustment of the mA and/or kV according to patient size and/or use of iterative reconstruction technique. CONTRAST:  OMNIPAQUE IOHEXOL 300 MG/ML  SOLN COMPARISON:  CT angiogram chest abdomen and pelvis 12/14/2022 FINDINGS: Lower chest: Mild emphysematous changes are present. Hepatobiliary: Subcentimeter hypodensity in the left lobe of the liver is too small to characterize, favored as a small cyst or  hemangioma. Gallbladder and bile ducts are within normal limits. Pancreas: Unremarkable. No pancreatic ductal dilatation or surrounding inflammatory changes. Spleen: Normal in size without focal abnormality. Adrenals/Urinary Tract: Adrenal glands are unremarkable. Kidneys are normal, without renal calculi, focal lesion, or hydronephrosis. There is marked diffuse bladder wall thickening as seen on the prior study. Large right and small left bladder diverticula appear stable. Stomach/Bowel: Stomach is within normal limits. Appendix appears normal. No evidence of bowel wall thickening, distention, or inflammatory changes. There is diffuse colonic diverticulosis. Small hiatal hernia is present. There is nonspecific fluid  distention of the duodenal. Vascular/Lymphatic: Aortic atherosclerosis. No enlarged abdominal or pelvic lymph nodes. Reproductive: Prostate is unremarkable. Other: No abdominal wall hernia or abnormality. No abdominopelvic ascites. Musculoskeletal: No acute or significant osseous findings. IMPRESSION: 1. No bowel obstruction. 2. There is nonspecific fluid distention of the duodenal. The stomach is nondilated. 3. Stable marked diffuse bladder wall thickening and bladder diverticula. 4. Colonic diverticulosis without evidence for diverticulitis. 5. Small hiatal hernia. Aortic Atherosclerosis (ICD10-I70.0). Electronically Signed   By: Darliss Cheney M.D.   On: 07/19/2023 18:42    Procedures Procedures    Medications Ordered in ED Medications  oxyCODONE-acetaminophen (PERCOCET/ROXICET) 5-325 MG per tablet 1 tablet (has no administration in time range)  morphine (PF) 2 MG/ML injection 2 mg (2 mg Intravenous Given 07/19/23 1630)  cefTRIAXone (ROCEPHIN) 1 g in sodium chloride 0.9 % 100 mL IVPB (0 g Intravenous Stopped 07/19/23 1811)  hydrALAZINE (APRESOLINE) tablet 100 mg (100 mg Oral Given 07/19/23 1811)  iohexol (OMNIPAQUE) 300 MG/ML solution 100 mL (100 mLs Intravenous Contrast Given 07/19/23 1821)     ED Course/ Medical Decision Making/ A&P Clinical Course as of 07/19/23 1918  Sun Jul 19, 2023  1645 Urinalysis, Routine w reflex microscopic -Urine, Clean Catch(!) C/w UTI. Will give antibiotics  [TY]  1852 CT ABDOMEN PELVIS W CONTRAST IMPRESSION: 1. No bowel obstruction. 2. There is nonspecific fluid distention of the duodenal. The stomach is nondilated. 3. Stable marked diffuse bladder wall thickening and bladder diverticula. 4. Colonic diverticulosis without evidence for diverticulitis. 5. Small hiatal hernia.  Aortic Atherosclerosis (ICD10-I70.0).   Electronically Signed   [TY]  1916 CBC with Differential(!) Has chronic leukocytosis, white blood cell similar to prior.  No anemia. [TY]  1916 Comprehensive metabolic panel(!) No significant metabolic derangements.  Normal kidney function.  No LFT derangements to suggest hepatobiliary disease. [TY]  1916 Lipase: 42 Pancreatitis unlikely. [TY]  [id number] Final MDM; 72 year old male presenting for generalized abdominal pain largely located suprapubic.  Lab workup significant for UTI.  CT abdomen pelvis without acute surgical pathology.  Patient treated supportively with morphine, given Rocephin for his infection.  Reports improvement of his symptoms.  Patient was also hypertensive in the 200s.  He is essentially asymptomatic.  He did not take his blood pressure medications today.  No evidence of endorgan damage,  Was given his home hydralazine.  Patient encouraged to follow-up with PCP.  Stable for discharge at this time with antibiotics for his UTI. [TY]    Clinical Course User Index [TY] Coral Spikes, DO                             Medical Decision Making 72 year old male to the emergency department with progressive 4-day worsening abdominal pain.  He is hypertensive, afebrile nontachycardic.  Physical exam with diffusely tender abdomen slightly accentuated over suprapubic area.  Will get abdominal screening labs.  Will  BladderScan.  Will place Foley if patient is retaining.  If no significant urinary retention will consider CT abdomen.  See ED course for further MDM disposition.  Amount and/or Complexity of Data Reviewed External Data Reviewed: notes.    Details: Complicated past medical history to include cancer, CHF, hypertension, prostate problems Labs: ordered. Decision-making details documented in ED Course. Radiology: ordered. Decision-making details documented in ED Course.  Risk Prescription drug management.         Final Clinical Impression(s) / ED Diagnoses Final diagnoses:  Abdominal pain, unspecified abdominal location  Secondary hypertension    Rx / DC Orders ED Discharge Orders          Ordered    cephALEXin (KEFLEX) 500 MG capsule  4 times daily,   Status:  Discontinued        07/19/23 1915    cephALEXin (KEFLEX) 500 MG capsule  4 times daily        07/19/23 1915              Coral Spikes, [place] 07/19/23 1918

## 2023-07-19 NOTE — Discharge Instructions (Signed)
You were seen for your abdominal pain.  Your vital signs, physical exam, labs and imaging were reassuring.  You do not have any acute emergent pathology requiring admission or acute intervention.  It appears you have a urinary tract infection.  We are prescribing you antibiotics.  Your blood pressure is also elevated.  Please take your blood pressure medications.  Return to the emergency department develop fevers, chills, worsening abdominal pain, nausea, vomiting, chest pain, shortness of breath, headache, vision changes, unilateral weakness, or any new or worsening symptoms that are concerning to you.

## 2023-07-20 ENCOUNTER — Telehealth: Payer: Self-pay

## 2023-07-20 ENCOUNTER — Encounter: Payer: Medicare HMO | Admitting: Vascular Surgery

## 2023-07-20 NOTE — Telephone Encounter (Signed)
Transition Care Management Follow-up Telephone Call Date of discharge and from where: 07/19/2023 [facility] How have you been since you were released from the hospital? Patient is feeling much better. Any questions or concerns? No  Items Reviewed: Did the pt receive and understand the discharge instructions provided? Yes  Medications obtained and verified? Yes  Other?  Verified patient's home address to send [facility] [employer] list.Sent [phone] referral to local pantry. Any new allergies since your discharge? No  Dietary orders reviewed? Yes Do you have support at home? Yes   Follow up appointments reviewed:  PCP Hospital f/u appt confirmed? No  Scheduled to see  on  @ . Specialist Hospital f/u appt confirmed? No  Scheduled to see  on  @ . Are transportation arrangements needed? No  If their condition worsens, is the pt aware to call PCP or go to the Emergency Dept.? Yes Was the patient provided with contact information for the PCP's office or ED? Yes Was to pt encouraged to call back with questions or concerns? Yes  [name]  [facility] Population Health Community Resource Care Guide   [name]  ?? [phone]   Website: [name]  [name]

## 2023-09-01 ENCOUNTER — Encounter: Payer: Self-pay | Admitting: Vascular Surgery

## 2023-09-01 ENCOUNTER — Ambulatory Visit (INDEPENDENT_AMBULATORY_CARE_PROVIDER_SITE_OTHER): Payer: Medicare HMO | Admitting: Vascular Surgery

## 2023-09-01 VITALS — BP 209/63 | HR 71 | Temp 98.3°F | Resp 18 | Ht 69.0 in | Wt 166.4 lb

## 2023-09-01 DIAGNOSIS — I70219 Atherosclerosis of native arteries of extremities with intermittent claudication, unspecified extremity: Secondary | ICD-10-CM | POA: Insufficient documentation

## 2023-09-01 DIAGNOSIS — I70213 Atherosclerosis of native arteries of extremities with intermittent claudication, bilateral legs: Secondary | ICD-10-CM | POA: Diagnosis not present

## 2023-09-01 NOTE — Progress Notes (Signed)
Patient name: [name] MRN: [id number] DOB: 26-Aug-1951 Sex: male  REASON FOR CONSULT: PAD with abnormal ABI's  HPI: [name] is a 72 y.o. male, with history of diastolic CHF, diabetes, hypertension, HOCM presents for evaluation of PAD.  Patient states both his legs have been hurting him for about 9 months.  Usually starts after walking about 100 to 200 feet.  Feels like it starts in his hips and runs down his legs.  No previous vascular interventions.  Patient had ABIs on 06/18/2023 that were 0.51 on the right monophasic and 0.46 on the left monophasic.  Patient states he smokes a pack about every 3 days.  Past Medical History:  Diagnosis Date   Alcohol dependence (HCC) 12/10/2013   Angioedema of lips 12/24/2013   Brain tumor (HCC)    CHF (congestive heart failure) (HCC)    diastolic   Chronic back pain    Chronic headache    CVA (cerebral vascular accident) (HCC)    Diabetes mellitus    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HCC) 02/18/2020   HTN (hypertension), malignant 09/06/2012   Hypertension    Narcotic abuse (HCC)    Normal cardiac stress test 2009   TIA (transient ischemic attack) 09/06/2012   Tobacco use disorder 12/10/2013   UTI (urinary tract infection)    Vertigo 09/06/2012    Past Surgical History:  Procedure Laterality Date   NO PAST SURGERIES      Family History  Problem Relation Age of Onset   CAD Father        "heart attack in his 15's"   Hypertension Father    Diabetes Brother    Diabetes Sister    Hypertension Mother     SOCIAL HISTORY: Social History   Socioeconomic History   Marital status: Legally Separated    Spouse name: Not on file   Number of children: Not on file   Years of education: Not on file   Highest education level: Not on file  Occupational History   Not on file  Tobacco Use   Smoking status: Every Day    Current packs/day: 0.50    Types: Cigarettes   Smokeless tobacco: Current  Vaping Use   Vaping  status: Never Used  Substance and Sexual Activity   Alcohol use: Yes   Drug use: Not Currently    Types: Marijuana, Cocaine, IV    Comment: Heroin - one month ago   Sexual activity: Not on file  Other Topics Concern   Not on file  Social History Narrative   Single.  Lives alone.     Social Determinants of Health   Financial Resource Strain: Not on file  Food Insecurity: Food Insecurity Present (07/20/2023)   Hunger Vital Sign    Worried About Running Out of Food in the Last Year: Sometimes true    Ran Out of Food in the Last Year: Never true  Transportation Needs: No Transportation Needs (03/03/2023)   PRAPARE - Administrator, Civil Service (Medical): No    Lack of Transportation (Non-Medical): No  Physical Activity: Not on file  Stress: Not on file  Social Connections: Not on file  Intimate Partner Violence: Not At Risk (03/03/2023)   Humiliation, Afraid, Rape, and Kick questionnaire    Fear of Current or Ex-Partner: No    Emotionally Abused: No    Physically Abused: No    Sexually Abused: No    No Known Allergies  Current Outpatient Medications  Medication Sig Dispense Refill   albuterol (PROVENTIL HFA;VENTOLIN HFA) 108 (90 Base) MCG/ACT inhaler Inhale 1-2 puffs into the lungs every 6 (six) hours as needed for wheezing. 1 Inhaler 0   aspirin EC 81 MG tablet Take 81 mg by mouth every morning.     atorvastatin (LIPITOR) 40 MG tablet Take 1 tablet (40 mg total) by mouth daily. 30 tablet 2   carvedilol (COREG) 25 MG tablet Take 1 tablet (25 mg total) by mouth 2 (two) times daily with a meal. 60 tablet 2   diclofenac Sodium (VOLTAREN) 1 % GEL Apply 1 application. topically 4 (four) times daily as needed (pain).     hydrALAZINE (APRESOLINE) 100 MG tablet Take 1 tablet (100 mg total) by mouth every 8 (eight) hours. 90 tablet 2   metFORMIN (GLUCOPHAGE) 500 MG tablet Take 1 tablet by mouth twice a day (Patient taking differently: Take 500 mg by mouth 2 (two) times daily.)  180 tablet 2   nicotine (NICODERM CQ - DOSED IN MG/24 HOURS) 21 mg/24hr patch Place 1 patch (21 mg total) onto the skin daily. (Patient taking differently: Place 21 mg onto the skin daily as needed (smoking cessation).) 28 patch 0   omeprazole (PRILOSEC) 20 MG capsule Take 1 capsule (20 mg total) by mouth daily. 30 capsule 0   ondansetron (ZOFRAN-ODT) 8 MG disintegrating tablet Take 1 tablet (8 mg total) by mouth every 8 (eight) hours as needed for nausea or vomiting. 20 tablet 0   sucralfate (CARAFATE) 1 GM/10ML suspension Take 10 mLs (1 g total) by mouth 4 (four) times daily -  with meals and at bedtime. 420 mL 0   tamsulosin (FLOMAX) 0.4 MG CAPS capsule TAKE TWO CAPSULES BY MOUTH AT BEDTIME 60 capsule 2   traMADol (ULTRAM) 50 MG tablet Take 1 tablet by mouth twice a day as needed for severe pains 10 tablet 0   verapamil (CALAN-SR) 180 MG CR tablet Take 2 tablets (360 mg total) by mouth daily. 60 tablet 2   No current facility-administered medications for this visit.    REVIEW OF SYSTEMS:  [X]  denotes positive finding, [ ]  denotes negative finding Cardiac  Comments:  Chest pain or chest pressure:    Shortness of breath upon exertion:    Short of breath when lying flat:    Irregular heart rhythm:        Vascular    Pain in calf, thigh, or hip brought on by ambulation: x   Pain in feet at night that wakes you up from your sleep:     Blood clot in your veins:    Leg swelling:         Pulmonary    Oxygen at home:    Productive cough:     Wheezing:         Neurologic    Sudden weakness in arms or legs:     Sudden numbness in arms or legs:     Sudden onset of difficulty speaking or slurred speech:    Temporary loss of vision in one eye:     Problems with dizziness:         Gastrointestinal    Blood in stool:     Vomited blood:         Genitourinary    Burning when urinating:     Blood in urine:        Psychiatric    Major depression:         Hematologic  Bleeding  problems:    Problems with blood clotting too easily:        Skin    Rashes or ulcers:        Constitutional    Fever or chills:      PHYSICAL EXAM: There were no vitals filed for this visit.  GENERAL: The patient is a well-nourished male, in no acute distress. The vital signs are documented above. CARDIAC: There is a regular rate and rhythm.  VASCULAR:  Bilateral femoral pulses palpable No palpable pedal pulses, no tissue loss PULMONARY: No respiratory distress. ABDOMEN: Soft and non-tender. MUSCULOSKELETAL: There are no major deformities or cyanosis. NEUROLOGIC: No focal weakness or paresthesias are detected. SKIN: There are no ulcers or rashes noted. PSYCHIATRIC: The patient has a normal affect.  DATA:    ABIs on 06/18/2023 that were 0.51 on the right monophasic and 0.46 on the left monophasic.  Assessment/Plan:   72 y.o. male, with history of diastolic CHF, diabetes, hypertension, HOCM presents for evaluation of PAD.  Ultimately discussed that his symptoms are consistent with intermittent claudication.  I discussed we manage this conservatively until it becomes lifestyle limiting.  I discussed with his active tobacco abuse will be very high risk for failure of any vascular intervention.  His ABI's were 0.51 right and 0.46 left.  I discussed conservative therapy would be a good initial option for management.  Discussed walking therapies at least 30 minutes a day 3 times a week and walking through the discomfort to build collaterals.  I discussed cutting down on the cigarettes for smoking cessation.  I am hesitant to prescribe Pletal given his diastolic heart failure with HOCM.  I will see him in 3 months.  I think if he has no improvement we can plan angiogram in the Cath Lab.  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   Cephus Shelling, MD [facility] Office: [phone]

## 2023-09-15 ENCOUNTER — Other Ambulatory Visit: Payer: Self-pay

## 2023-09-15 DIAGNOSIS — I70213 Atherosclerosis of native arteries of extremities with intermittent claudication, bilateral legs: Secondary | ICD-10-CM

## 2023-10-23 ENCOUNTER — Emergency Department (HOSPITAL_COMMUNITY)
Admission: EM | Admit: 2023-10-23 | Discharge: 2023-10-24 | Disposition: A | Discharge status: Home / Self Care | Payer: Medicare HMO | Attending: Emergency Medicine | Admitting: Emergency Medicine

## 2023-10-23 ENCOUNTER — Other Ambulatory Visit: Payer: Self-pay

## 2023-10-23 ENCOUNTER — Encounter (HOSPITAL_COMMUNITY): Payer: Self-pay | Admitting: Emergency Medicine

## 2023-10-23 DIAGNOSIS — N433 Hydrocele, unspecified: Secondary | ICD-10-CM | POA: Diagnosis not present

## 2023-10-23 DIAGNOSIS — I861 Scrotal varices: Secondary | ICD-10-CM | POA: Diagnosis not present

## 2023-10-23 DIAGNOSIS — Z7982 Long term (current) use of aspirin: Secondary | ICD-10-CM | POA: Diagnosis not present

## 2023-10-23 DIAGNOSIS — N390 Urinary tract infection, site not specified: Secondary | ICD-10-CM | POA: Insufficient documentation

## 2023-10-23 DIAGNOSIS — I700 Atherosclerosis of aorta: Secondary | ICD-10-CM | POA: Diagnosis not present

## 2023-10-23 DIAGNOSIS — K760 Fatty (change of) liver, not elsewhere classified: Secondary | ICD-10-CM | POA: Diagnosis not present

## 2023-10-23 DIAGNOSIS — N5082 Scrotal pain: Secondary | ICD-10-CM | POA: Diagnosis present

## 2023-10-23 NOTE — ED Provider Triage Note (Signed)
Emergency Medicine Provider Triage Evaluation Note  [name] , a 72 y.o. male  was evaluated in triage.  Pt complains of abdominal pain. Pain ongoing x 2 days. Located in lower pelvic region. No nausea, vomiting, or diarrhea. Having regular bowel movements. Hx of frequent UTIs previously, states this feels completely different. Does note that his abdomen is more distended than usual. Is urinating regularly, no concern for retention. No fevers or chills.   Review of Systems  Positive:  Negative:   Physical Exam  BP (!) 179/56 (BP Location: Left Arm)   Pulse 64   Temp 97.9 F (36.6 C) (Oral)   Resp 20   Ht 5\' 9"  (1.753 m)   Wt 72.6 kg   SpO2 100%   BMI 23.63 kg/m  Gen:   Awake, no distress   Resp:  Normal effort  MSK:   Moves extremities without difficulty  Other:  Periumbilical/suprapubic abdominal ttp. Abdominal distention.   Medical Decision Making  Medically screening exam initiated at 11:47 PM.  Appropriate orders placed.  Delma Officer Brueckner was informed that the remainder of the evaluation will be completed by another provider, this initial triage assessment does not replace that evaluation, and the importance of remaining in the ED until their evaluation is complete.  Work-up initiated   Vear Clock 10/23/23 2350

## 2023-10-23 NOTE — ED Triage Notes (Addendum)
Patient c/o lower abdominal pain that radiates to groin x 2 days. Denies n/v/d. Hypertensive in triage - patient states he has missed the past few days of BP medication.

## 2023-10-24 ENCOUNTER — Emergency Department (HOSPITAL_COMMUNITY): Payer: Medicare HMO

## 2023-10-24 DIAGNOSIS — N433 Hydrocele, unspecified: Secondary | ICD-10-CM | POA: Diagnosis not present

## 2023-10-24 LAB — COMPREHENSIVE METABOLIC PANEL
ALT: 15 U/L (ref 0–44)
AST: 18 U/L (ref 15–41)
Albumin: 3.3 g/dL — ABNORMAL LOW (ref 3.5–5.0)
Alkaline Phosphatase: 63 U/L (ref 38–126)
Anion gap: 8 (ref 5–15)
BUN: 13 mg/dL (ref 8–23)
CO2: 24 mmol/L (ref 22–32)
Calcium: 9 mg/dL (ref 8.9–10.3)
Chloride: 106 mmol/L (ref 98–111)
Creatinine, Ser: 0.77 mg/dL (ref 0.61–1.24)
GFR, Estimated: >60 mL/min (ref >60)
Glucose, Bld: 101 mg/dL — ABNORMAL HIGH (ref 70–99)
Potassium: 3.6 mmol/L (ref 3.5–5.1)
Sodium: 138 mmol/L (ref 135–145)
Total Bilirubin: 0.5 mg/dL (ref 0.3–1.2)
Total Protein: 7 g/dL (ref 6.5–8.1)

## 2023-10-24 LAB — URINALYSIS, ROUTINE W REFLEX MICROSCOPIC
Bilirubin Urine: NEGATIVE
Glucose, UA: NEGATIVE mg/dL
Ketones, ur: NEGATIVE mg/dL
Nitrite: NEGATIVE
Protein, ur: 30 mg/dL — AB
Specific Gravity, Urine: 1.034 — ABNORMAL HIGH (ref 1.005–1.030)
WBC, UA: >50 WBC/hpf (ref 0–5)
pH: 5 (ref 5.0–8.0)

## 2023-10-24 LAB — CBC WITH DIFFERENTIAL/PLATELET
Abs Immature Granulocytes: 0.1 10*3/uL — ABNORMAL HIGH (ref 0.00–0.07)
Basophils Absolute: 0.1 10*3/uL (ref 0.0–0.1)
Basophils Relative: 0 %
Eosinophils Absolute: 0.5 10*3/uL (ref 0.0–0.5)
Eosinophils Relative: 3 %
HCT: 41 % (ref 39.0–52.0)
Hemoglobin: 12.3 g/dL — ABNORMAL LOW (ref 13.0–17.0)
Immature Granulocytes: 1 %
Lymphocytes Relative: 25 %
Lymphs Abs: 4 10*3/uL (ref 0.7–4.0)
MCH: 23 pg — ABNORMAL LOW (ref 26.0–34.0)
MCHC: 30 g/dL (ref 30.0–36.0)
MCV: 76.8 fL — ABNORMAL LOW (ref 80.0–100.0)
Monocytes Absolute: 1.1 10*3/uL — ABNORMAL HIGH (ref 0.1–1.0)
Monocytes Relative: 7 %
Neutro Abs: 10.1 10*3/uL — ABNORMAL HIGH (ref 1.7–7.7)
Neutrophils Relative %: 64 %
Platelets: 291 10*3/uL (ref 150–400)
RBC: 5.34 MIL/uL (ref 4.22–5.81)
RDW: 14.6 % (ref 11.5–15.5)
WBC: 15.9 10*3/uL — ABNORMAL HIGH (ref 4.0–10.5)
nRBC: 0 % (ref 0.0–0.2)

## 2023-10-24 LAB — LIPASE, BLOOD: Lipase: 26 U/L (ref 11–51)

## 2023-10-24 MED ORDER — IOHEXOL 300 MG/ML  SOLN
100.0000 mL | Freq: Once | INTRAMUSCULAR | Status: AC | PRN
  Administered 2023-10-24: 100 mL via INTRAVENOUS

## 2023-10-24 MED ORDER — MORPHINE SULFATE (PF) 4 MG/ML IV SOLN
4.0000 mg | Freq: Once | INTRAVENOUS | Status: AC
Start: 2023-10-24 — End: 2023-10-24
  Administered 2023-10-24: 4 mg via INTRAVENOUS
  Filled 2023-10-24: qty 1

## 2023-10-24 MED ORDER — SODIUM CHLORIDE 0.9 % IV SOLN
1.0000 g | Freq: Once | INTRAVENOUS | Status: AC
  Administered 2023-10-24: 1 g via INTRAVENOUS
  Filled 2023-10-24: qty 10

## 2023-10-24 MED ORDER — AMPICILLIN 500 MG PO CAPS: 500.0000 mg | ORAL_CAPSULE | Freq: Four times a day (QID) | ORAL | 0 refills | Status: AC

## 2023-10-24 NOTE — ED Notes (Signed)
Encouraged patient to use urinal to urinate pt states "I will try in a minute"

## 2023-10-24 NOTE — Discharge Instructions (Addendum)
Return for any problem.  ?

## 2023-10-24 NOTE — ED Provider Notes (Signed)
[name] EMERGENCY DEPARTMENT AT [facility] Provider Note   CSN: [id number] Arrival date & time: 10/23/23  2326     History  Chief Complaint  Patient presents with   Abdominal Pain    [name] is a 72 y.o. male.  72 year old male with prior medical history as detailed below presents for evaluation.  Patient complains of pain to the right scrotum.  Patient reports onset of pain over the last 2 days.  He denies fever.  He denies nausea or vomiting.  Patient with longstanding history of frequent UTIs.  He denies urinary symptoms.  He denies abdominal pain.  The history is provided by the patient and medical records.       Home Medications Prior to Admission medications   Medication Sig Start Date End Date Taking? Authorizing Provider  albuterol (PROVENTIL HFA;VENTOLIN HFA) 108 (90 Base) MCG/ACT inhaler Inhale 1-2 puffs into the lungs every 6 (six) hours as needed for wheezing. 09/04/16   Rolland Porter, MD  aspirin EC 81 MG tablet Take 81 mg by mouth every morning.    [provider]  atorvastatin (LIPITOR) 40 MG tablet Take 1 tablet (40 mg total) by mouth daily. 03/06/23   Shalhoub, Deno Lunger, MD  carvedilol (COREG) 25 MG tablet Take 1 tablet (25 mg total) by mouth 2 (two) times daily with a meal. 03/05/23   Shalhoub, Deno Lunger, MD  diclofenac Sodium (VOLTAREN) 1 % GEL Apply 1 application. topically 4 (four) times daily as needed (pain). 12/27/21   [provider]  hydrALAZINE (APRESOLINE) 100 MG tablet Take 1 tablet (100 mg total) by mouth every 8 (eight) hours. 03/05/23   Shalhoub, Deno Lunger, MD  metFORMIN (GLUCOPHAGE) 500 MG tablet Take 1 tablet by mouth twice a day Patient taking differently: Take 500 mg by mouth 2 (two) times daily. 06/04/21     nicotine (NICODERM CQ - DOSED IN MG/24 HOURS) 21 mg/24hr patch Place 1 patch (21 mg total) onto the skin daily. Patient taking differently: Place 21 mg onto the skin daily as needed (smoking cessation). 02/21/20    Rodolph Bong, MD  omeprazole (PRILOSEC) 20 MG capsule Take 1 capsule (20 mg total) by mouth daily. 12/14/22   Arby Barrette, MD  ondansetron (ZOFRAN-ODT) 8 MG disintegrating tablet Take 1 tablet (8 mg total) by mouth every 8 (eight) hours as needed for nausea or vomiting. 08/19/22   Margarita Grizzle, MD  sucralfate (CARAFATE) 1 GM/10ML suspension Take 10 mLs (1 g total) by mouth 4 (four) times daily -  with meals and at bedtime. 12/14/22   Arby Barrette, MD  tamsulosin (FLOMAX) 0.4 MG CAPS capsule TAKE TWO CAPSULES BY MOUTH AT BEDTIME 03/05/23   Shalhoub, Deno Lunger, MD  traMADol (ULTRAM) 50 MG tablet Take 1 tablet by mouth twice a day as needed for severe pains 06/04/21     verapamil (CALAN-SR) 180 MG CR tablet Take 2 tablets (360 mg total) by mouth daily. 03/06/23   Shalhoub, Deno Lunger, MD      Allergies    Patient has no known allergies.    Review of Systems   Review of Systems  All other systems reviewed and are negative.   Physical Exam Updated Vital Signs BP (!) 215/59 (BP Location: Left Arm)   Pulse 61   Temp 97.7 F (36.5 C) (Oral)   Resp 16   Ht 5\' 9"  (1.753 m)   Wt 72.6 kg   SpO2 100%   BMI 23.63 kg/m  Physical Exam Vitals and nursing note reviewed.  Constitutional:      General: He is not in acute distress.    Appearance: Normal appearance. He is well-developed.  HENT:     Head: Normocephalic and atraumatic.  Eyes:     Conjunctiva/sclera: Conjunctivae normal.     Pupils: Pupils are equal, round, and reactive to light.  Cardiovascular:     Rate and Rhythm: Normal rate and regular rhythm.     Heart sounds: Normal heart sounds.  Pulmonary:     Effort: Pulmonary effort is normal. No respiratory distress.     Breath sounds: Normal breath sounds.  Abdominal:     General: There is no distension.     Palpations: Abdomen is soft.     Tenderness: There is no abdominal tenderness.  Genitourinary:    Comments: Mild tenderness and edema noted to the right scrotum.  No  penile discharge.  No overlying erythema. Musculoskeletal:        General: No deformity. Normal range of motion.     Cervical back: Normal range of motion and neck supple.  Skin:    General: Skin is warm and dry.  Neurological:     General: No focal deficit present.     Mental Status: He is alert and oriented to person, place, and time.     ED Results / Procedures / Treatments   Labs (all labs ordered are listed, but only abnormal results are displayed) Labs Reviewed  COMPREHENSIVE METABOLIC PANEL - Abnormal; Notable for the following components:      Result Value   Glucose, Bld 101 (*)    Albumin 3.3 (*)    All other components within normal limits  CBC WITH DIFFERENTIAL/PLATELET - Abnormal; Notable for the following components:   WBC 15.9 (*)    Hemoglobin 12.3 (*)    MCV 76.8 (*)    MCH 23.0 (*)    Neutro Abs 10.1 (*)    Monocytes Absolute 1.1 (*)    Abs Immature Granulocytes 0.10 (*)    All other components within normal limits  URINALYSIS, ROUTINE W REFLEX MICROSCOPIC - Abnormal; Notable for the following components:   APPearance CLOUDY (*)    Specific Gravity, Urine 1.034 (*)    Hgb urine dipstick SMALL (*)    Protein, ur 30 (*)    Leukocytes,Ua LARGE (*)    Bacteria, UA RARE (*)    All other components within normal limits  LIPASE, BLOOD    EKG None  Radiology US SCROTUM W/DOPPLER  Result Date: 10/24/2023 CLINICAL DATA:  72 year old male history of testicular pain for the past 2 days. EXAM: SCROTAL ULTRASOUND DOPPLER ULTRASOUND OF THE TESTICLES TECHNIQUE: Complete ultrasound examination of the testicles, epididymis, and other scrotal structures was performed. Color and spectral Doppler ultrasound were also utilized to evaluate blood flow to the testicles. COMPARISON:  No priors. FINDINGS: Right testicle Measurements: 3.6 x 2.6 x 3.8 cm. No mass or microlithiasis visualized. Left testicle Measurements: 4.2 x 2.3 x 2.4 cm. No mass or microlithiasis visualized.  Right epididymis:  Normal in size and appearance. Left epididymis:  Normal in size and appearance. Hydrocele: Small to moderate right-sided hydrocele. No left-sided hydrocele. Varicocele:  Right-sided varicocele. Pulsed Doppler interrogation of both testes demonstrates normal low resistance arterial and venous waveforms bilaterally. IMPRESSION: 1. Small to moderate right-sided hydrocele, with large right varicocele. 2. No other acute findings. Specifically, no evidence of epididymo-orchitis or findings to suggest testicular torsion. Electronically Signed   By: Reuel Boom  Entrikin M.D.   On: 10/24/2023 10:26   CT ABDOMEN PELVIS W CONTRAST  Result Date: 10/24/2023 CLINICAL DATA:  Lower abdominal pain radiating to the groin for 2 days. EXAM: CT ABDOMEN AND PELVIS WITH CONTRAST TECHNIQUE: Multidetector CT imaging of the abdomen and pelvis was performed using the standard protocol following bolus administration of intravenous contrast. RADIATION DOSE REDUCTION: This exam was performed according to the departmental dose-optimization program which includes automated exposure control, adjustment of the mA and/or kV according to patient size and/or use of iterative reconstruction technique. CONTRAST:  OMNIPAQUE IOHEXOL 300 MG/ML  SOLN COMPARISON:  CT abdomen and pelvis 07/19/2023 FINDINGS: Lower chest: No acute abnormality. Hepatobiliary: Hepatic steatosis. Normal gallbladder. No biliary dilation. Pancreas: Unremarkable. Spleen: Unremarkable. Adrenals/Urinary Tract: Normal adrenal glands. No urinary calculi or hydronephrosis. Diffuse bladder wall thickening large superior bladder diverticulum. Additional posterior left bladder diverticulum. Stomach/Bowel: Normal caliber large and small bowel. Colonic diverticulosis without diverticulitis. No bowel wall thickening. The appendix is normal.Stomach is within normal limits. Vascular/Lymphatic: Aortic atherosclerosis. No enlarged abdominal or pelvic lymph nodes.  Reproductive: Unremarkable prostate. Heterogenous enhancement and hydrocele is partially visualized in the right scrotum. Other: No free intraperitoneal fluid or air. Musculoskeletal: No acute fracture. IMPRESSION: 1. Diffuse bladder wall thickening with diverticula. 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 Correlate with urinalysis for cystitis. 2. Heterogenous enhancement and hydrocele is partially visualized in the right scrotum. Recommend scrotal ultrasound for further evaluation. 3. Hepatic steatosis. Aortic Atherosclerosis (ICD10-I70.0). Electronically Signed   By: Minerva Fester M.D.   On: 10/24/2023 02:54    Procedures Procedures    Medications Ordered in ED Medications  iohexol (OMNIPAQUE) 300 MG/ML solution 100 mL (100 mLs Intravenous Contrast Given 10/24/23 0122)  cefTRIAXone (ROCEPHIN) 1 g in sodium chloride 0.9 % 100 mL IVPB (0 g Intravenous Stopped 10/24/23 0815)  morphine (PF) 4 MG/ML injection 4 mg (4 mg Intravenous Given 10/24/23 0809)    ED Course/ Medical Decision Making/ A&P                                 Medical Decision Making Amount and/or Complexity of Data Reviewed Radiology: ordered.  Risk Prescription drug management.    Medical Screen Complete  This patient presented to the ED with complaint of scrotal pain.  This complaint involves an extensive number of treatment options. The initial differential diagnosis includes, but is not limited to, testicular torsion, scrotal abscess, hydrocele, UTI, metabolic abnormality, etc.  This presentation is: Acute, Chronic, Self-Limited, Previously Undiagnosed, Uncertain Prognosis, Complicated, Systemic Symptoms, and Threat to Life/Bodily Function  Patient presents with primary complaint of scrotal pain.  Patient's exam is most consistent with likely hydrocele.  Ultrasound of the scrotum reveals hydrocele.    Additionally patient with known history of frequent and chronic UTIs.  Urine  obtained today suggest active infection in the urine.  Patient with impaired bladder emptying after voiding.  Patient with history of enlarged prostate.  Patient would benefit from Foley catheter placement.  However, patient refuses same.  He reports that he has had "thousands" of Foley's previously.  He would prefer to take antibiotics and follow-up with his urology outpatient care team on Monday.  Patient has capacity to refuse care.  Patient given course of outpatient antibiotics.  Importance of close follow-up is stressed.  Strict return precautions given understood.  Of note, patient with elevated blood pressure in the ED.  Patient reportedly did not  take his home blood pressure medicines.  He is asymptomatic.  He reports that he will take his blood pressure medicines at home after discharge.  Additional history obtained: External records from outside sources obtained and reviewed including prior ED visits and prior Inpatient records.    Lab Tests:  I ordered and personally interpreted labs.  The pertinent results include: CBC, CMP, lipase, UA   Imaging Studies ordered:  I ordered imaging studies including CT abdomen pelvis, ultrasound scrotum I independently visualized and interpreted obtained imaging which showed hydrocele I agree with the radiologist interpretation.   Cardiac Monitoring:  The patient was maintained on a cardiac monitor.  I personally viewed and interpreted the cardiac monitor which showed an underlying rhythm of: NSR   Medicines ordered:  I ordered medication including Rocephin for UTI Reevaluation of the patient after these medicines showed that the patient: improved   Problem List / ED Course:  UTI, hydrocele   Reevaluation:  After the interventions noted above, I reevaluated the patient and found that they have: improved  Disposition:  After consideration of the diagnostic results and the patients response to treatment, I feel that the patent  would benefit from close outpatient follow-up.          Final Clinical Impression(s) / ED Diagnoses Final diagnoses:  Urinary tract infection without hematuria, site unspecified  Hydrocele in adult    Rx / DC Orders ED Discharge Orders          Ordered    ampicillin (PRINCIPEN) 500 MG capsule  4 times daily        10/24/23 1053              Wynetta Fines, MD 10/24/23 1152

## 2023-11-11 ENCOUNTER — Observation Stay (HOSPITAL_COMMUNITY)
Admission: EM | Admit: 2023-11-11 | Discharge: 2023-11-12 | Disposition: A | Discharge status: Home / Self Care | Payer: Medicare HMO | Attending: Family Medicine | Admitting: Family Medicine

## 2023-11-11 ENCOUNTER — Emergency Department (HOSPITAL_COMMUNITY): Payer: Medicare HMO

## 2023-11-11 ENCOUNTER — Encounter (HOSPITAL_COMMUNITY): Payer: Self-pay

## 2023-11-11 DIAGNOSIS — R531 Weakness: Secondary | ICD-10-CM | POA: Diagnosis not present

## 2023-11-11 DIAGNOSIS — I110 Hypertensive heart disease with heart failure: Secondary | ICD-10-CM | POA: Diagnosis not present

## 2023-11-11 DIAGNOSIS — Z79899 Other long term (current) drug therapy: Secondary | ICD-10-CM | POA: Insufficient documentation

## 2023-11-11 DIAGNOSIS — I4581 Long QT syndrome: Secondary | ICD-10-CM | POA: Diagnosis not present

## 2023-11-11 DIAGNOSIS — S0990XA Unspecified injury of head, initial encounter: Secondary | ICD-10-CM | POA: Insufficient documentation

## 2023-11-11 DIAGNOSIS — E876 Hypokalemia: Secondary | ICD-10-CM

## 2023-11-11 DIAGNOSIS — W1830XA Fall on same level, unspecified, initial encounter: Secondary | ICD-10-CM | POA: Insufficient documentation

## 2023-11-11 DIAGNOSIS — D72829 Elevated white blood cell count, unspecified: Secondary | ICD-10-CM

## 2023-11-11 DIAGNOSIS — R202 Paresthesia of skin: Secondary | ICD-10-CM | POA: Insufficient documentation

## 2023-11-11 DIAGNOSIS — Z7982 Long term (current) use of aspirin: Secondary | ICD-10-CM | POA: Insufficient documentation

## 2023-11-11 DIAGNOSIS — I421 Obstructive hypertrophic cardiomyopathy: Secondary | ICD-10-CM

## 2023-11-11 DIAGNOSIS — I160 Hypertensive urgency: Secondary | ICD-10-CM | POA: Diagnosis not present

## 2023-11-11 DIAGNOSIS — I5032 Chronic diastolic (congestive) heart failure: Secondary | ICD-10-CM | POA: Insufficient documentation

## 2023-11-11 DIAGNOSIS — M25551 Pain in right hip: Secondary | ICD-10-CM

## 2023-11-11 DIAGNOSIS — N400 Enlarged prostate without lower urinary tract symptoms: Secondary | ICD-10-CM

## 2023-11-11 DIAGNOSIS — R9431 Abnormal electrocardiogram [ECG] [EKG]: Secondary | ICD-10-CM

## 2023-11-11 DIAGNOSIS — W19XXXA Unspecified fall, initial encounter: Secondary | ICD-10-CM | POA: Diagnosis not present

## 2023-11-11 DIAGNOSIS — Z7984 Long term (current) use of oral hypoglycemic drugs: Secondary | ICD-10-CM | POA: Insufficient documentation

## 2023-11-11 DIAGNOSIS — Z8673 Personal history of transient ischemic attack (TIA), and cerebral infarction without residual deficits: Secondary | ICD-10-CM | POA: Diagnosis not present

## 2023-11-11 DIAGNOSIS — I959 Hypotension, unspecified: Secondary | ICD-10-CM

## 2023-11-11 DIAGNOSIS — F1910 Other psychoactive substance abuse, uncomplicated: Secondary | ICD-10-CM | POA: Insufficient documentation

## 2023-11-11 DIAGNOSIS — I10 Essential (primary) hypertension: Secondary | ICD-10-CM

## 2023-11-11 DIAGNOSIS — F17210 Nicotine dependence, cigarettes, uncomplicated: Secondary | ICD-10-CM | POA: Insufficient documentation

## 2023-11-11 DIAGNOSIS — E119 Type 2 diabetes mellitus without complications: Secondary | ICD-10-CM

## 2023-11-11 DIAGNOSIS — Y92019 Unspecified place in single-family (private) house as the place of occurrence of the external cause: Secondary | ICD-10-CM | POA: Insufficient documentation

## 2023-11-11 LAB — BASIC METABOLIC PANEL
Anion gap: 9 (ref 5–15)
BUN: 9 mg/dL (ref 8–23)
CO2: 23 mmol/L (ref 22–32)
Calcium: 8.9 mg/dL (ref 8.9–10.3)
Chloride: 109 mmol/L (ref 98–111)
Creatinine, Ser: 0.7 mg/dL (ref 0.61–1.24)
GFR, Estimated: >60 mL/min (ref >60)
Glucose, Bld: 98 mg/dL (ref 70–99)
Potassium: 3.4 mmol/L — ABNORMAL LOW (ref 3.5–5.1)
Sodium: 141 mmol/L (ref 135–145)

## 2023-11-11 LAB — CBC
HCT: 37.9 % — ABNORMAL LOW (ref 39.0–52.0)
Hemoglobin: 11.9 g/dL — ABNORMAL LOW (ref 13.0–17.0)
MCH: 23.6 pg — ABNORMAL LOW (ref 26.0–34.0)
MCHC: 31.4 g/dL (ref 30.0–36.0)
MCV: 75.2 fL — ABNORMAL LOW (ref 80.0–100.0)
Platelets: 253 10*3/uL (ref 150–400)
RBC: 5.04 MIL/uL (ref 4.22–5.81)
RDW: 14.4 % (ref 11.5–15.5)
WBC: 11.3 10*3/uL — ABNORMAL HIGH (ref 4.0–10.5)
nRBC: 0 % (ref 0.0–0.2)

## 2023-11-11 LAB — CBG MONITORING, ED: Glucose-Capillary: 149 mg/dL — ABNORMAL HIGH (ref 70–99)

## 2023-11-11 LAB — MAGNESIUM: Magnesium: 1.8 mg/dL (ref 1.7–2.4)

## 2023-11-11 MED ORDER — HYDRALAZINE HCL 25 MG PO TABS
100.0000 mg | ORAL_TABLET | Freq: Once | ORAL | Status: AC
  Administered 2023-11-11: 100 mg via ORAL
  Filled 2023-11-11: qty 4

## 2023-11-11 MED ORDER — TAMSULOSIN HCL 0.4 MG PO CAPS: 0.8000 mg | ORAL_CAPSULE | Freq: Every day | ORAL | Status: DC

## 2023-11-11 MED ORDER — ACETAMINOPHEN 650 MG RE SUPP: 650.0000 mg | Freq: Four times a day (QID) | RECTAL | Status: DC | PRN

## 2023-11-11 MED ORDER — ONDANSETRON HCL 4 MG/2ML IJ SOLN: 4.0000 mg | Freq: Four times a day (QID) | INTRAMUSCULAR | Status: DC | PRN

## 2023-11-11 MED ORDER — KETOROLAC TROMETHAMINE 15 MG/ML IJ SOLN
15.0000 mg | Freq: Once | INTRAMUSCULAR | Status: DC
  Filled 2023-11-11: qty 1

## 2023-11-11 MED ORDER — MELATONIN 3 MG PO TABS: 3.0000 mg | ORAL_TABLET | Freq: Every evening | ORAL | Status: DC | PRN

## 2023-11-11 MED ORDER — ACETAMINOPHEN 325 MG PO TABS
650.0000 mg | ORAL_TABLET | Freq: Once | ORAL | Status: AC
  Administered 2023-11-11: 650 mg via ORAL
  Filled 2023-11-11: qty 2

## 2023-11-11 MED ORDER — LACTATED RINGERS IV SOLN: INTRAVENOUS | Status: AC

## 2023-11-11 MED ORDER — PANTOPRAZOLE SODIUM 40 MG PO TBEC
40.0000 mg | DELAYED_RELEASE_TABLET | Freq: Every day | ORAL | Status: DC
  Administered 2023-11-12: 40 mg via ORAL
  Filled 2023-11-11: qty 1

## 2023-11-11 MED ORDER — ASPIRIN 81 MG PO TBEC
81.0000 mg | DELAYED_RELEASE_TABLET | Freq: Every morning | ORAL | Status: DC
  Administered 2023-11-12: 81 mg via ORAL
  Filled 2023-11-11: qty 1

## 2023-11-11 MED ORDER — CARVEDILOL 12.5 MG PO TABS
25.0000 mg | ORAL_TABLET | Freq: Once | ORAL | Status: AC
  Administered 2023-11-11: 25 mg via ORAL
  Filled 2023-11-11: qty 2

## 2023-11-11 MED ORDER — ACETAMINOPHEN 325 MG PO TABS
650.0000 mg | ORAL_TABLET | Freq: Four times a day (QID) | ORAL | Status: DC | PRN
Start: 2023-11-11 — End: 2023-11-11

## 2023-11-11 MED ORDER — KETOROLAC TROMETHAMINE 15 MG/ML IJ SOLN
15.0000 mg | Freq: Once | INTRAMUSCULAR | Status: AC
  Administered 2023-11-11: 15 mg via INTRAMUSCULAR

## 2023-11-11 MED ORDER — POTASSIUM CHLORIDE CRYS ER 20 MEQ PO TBCR
40.0000 meq | EXTENDED_RELEASE_TABLET | Freq: Once | ORAL | Status: AC
  Administered 2023-11-11: 40 meq via ORAL
  Filled 2023-11-11: qty 2

## 2023-11-11 MED ORDER — ACETAMINOPHEN 500 MG PO TABS: 1000.0000 mg | ORAL_TABLET | Freq: Four times a day (QID) | ORAL | Status: DC | PRN

## 2023-11-11 MED ORDER — VERAPAMIL HCL ER 180 MG PO TBCR
360.0000 mg | EXTENDED_RELEASE_TABLET | Freq: Once | ORAL | Status: AC
  Administered 2023-11-11: 360 mg via ORAL
  Filled 2023-11-11: qty 2

## 2023-11-11 MED ORDER — ATORVASTATIN CALCIUM 40 MG PO TABS
40.0000 mg | ORAL_TABLET | Freq: Every day | ORAL | Status: DC
  Administered 2023-11-12: 40 mg via ORAL
  Filled 2023-11-11: qty 1

## 2023-11-11 MED ORDER — ACETAMINOPHEN 650 MG RE SUPP
650.0000 mg | Freq: Four times a day (QID) | RECTAL | Status: DC | PRN
Start: 2023-11-11 — End: 2023-11-11

## 2023-11-11 MED ORDER — INSULIN ASPART 100 UNIT/ML IJ SOLN
0.0000 [IU] | Freq: Three times a day (TID) | INTRAMUSCULAR | Status: DC
  Administered 2023-11-12: 2 [IU] via SUBCUTANEOUS
  Filled 2023-11-11: qty 0.09

## 2023-11-11 MED ORDER — LACTATED RINGERS IV BOLUS
1000.0000 mL | Freq: Once | INTRAVENOUS | Status: AC
  Administered 2023-11-11: 1000 mL via INTRAVENOUS

## 2023-11-11 NOTE — ED Triage Notes (Addendum)
Pt c/o generalized tinging/"pin and needles" starting last night and headache starting en route to ED today.  Pain score 10/10.  Pt reports falling d/t tingling.  C/o R hip pain d/t fall.      Pt was able to ambulate into triage room w/o difficulty.     Pt sts he hasn't been eating well d/t lack of appetite.  Pt sts he relapsed on drug but hasn't used x1 week.   Pt hasn't taken HTN medication x3 days.

## 2023-11-11 NOTE — ED Notes (Signed)
Patient transported to x-ray. ?

## 2023-11-11 NOTE — Discharge Instructions (Signed)
You were seen in the emergency department after your fall.  Your workup showed no signs of injuries.  Your labs were normal.  You could have a mild concussion causing your headache and your tingling in your arms and legs.  You can continue to take Tylenol and Motrin as needed for headaches and you can follow-up with your primary doctor in the next few days to have your symptoms rechecked.  You should return to the emergency department for significantly worsening pain, numbness or weakness in one-sided body compared to the other, repetitive vomiting or any other new or concerning symptoms.   Patient given [facility] [employer] in [place], and [facility] [employer] in [place]

## 2023-11-11 NOTE — H&P (Signed)
History and Physical      [name] JYN:[id number] DOB: Jan 03, 1951 DOA: 11/11/2023; DOS: 11/11/2023  PCP: Fleet Contras, MD  Patient coming from: home   I have personally briefly reviewed patient's old medical records in [facility]  Chief Complaint: Generalized weakness  HPI: [name] is a 72 y.o. male with medical history significant for essential hypertension, type 2 diabetes mellitus, HOCM, who is admitted to [facility] on 11/11/2023 with generalized weakness after presenting from home to [facility] ED complaining of generalized weakness.   The patient reports 2 to 3 days of generalized weakness in the absence of any acute focal weakness.  He also denies any acute focal numbness or paresthesias.  In the setting of his generalized weakness, the patient reports that he tripped while attempting to ambulate on the evening of 11/10/2023, resulting in a ground-level mechanical fall.  He notes that he did hit his head as a component of this fall.  Denies any preceding loss of consciousness nor any presyncope.  Denies any associated chest pain, shortness of breath, palpitations, diaphoresis, nausea, vomiting.  He conveys that he is on a daily baby aspirin, but otherwise on no additional blood thinners as an outpatient.  As a consequence of this fall, he reports some new onset sharp, nonradiating right hip discomfort.  He also conveys a mild frontal lobe headache, in the absence of any new onset neck pain or neck stiffness.  Denies any recent subjective fever, chills, rigors, or generalized myalgias.  Considering his generalized weakness, the patient reports that he has not taken any of his outpatient medications over the last 2 to 3 days, including none of his outpatient antihypertensive medications.  His medical history is also notable for HOCM, with most recent echocardiogram performed on 03/02/2023, which was notable for LVEF 70 to 75%, normal diastolic parameters, findings  consistent with HOCM physiology, normal right ventricular systolic function, and mild mitral regurgitation.      ED Course:  Vital signs in the ED were notable for the following: Afebrile; heart rates in the 50s to 70s; his initial systolic blood pressures were in the low 200s.  In the context of the conveying that he had not taken his outpatient antihypertensive medications over the course the last 2 to 3 days, his outpatient antihypertensive medications were resumed, including doses of Coreg, hydralazine, and verapamil.  Subsequent to this, his systolic blood pressure decreased into the 90s, before subsequently in creasing into the 1 teens following interval IV fluids, as further quantified below; respiratory rate 15-17, oxygen saturation 96 to 100% on room air.  Labs were notable for the following: BMP notable for the following: Sodium 141, potassium 3.4, creatinine 0.70, glucose 98.  Serum magnesium level 1.8.  CBC notable for will with cell count 11,300, compared to most recent prior white cell count of 15,900 on 10/23/2023.  Urinary drug screen is pending.  Per my interpretation, EKG in ED demonstrated the following: In comparison to most recent prior EKG from 03/02/2023, presenting EKG shows sinus rhythm with heart rate 64, QTc prolongation with QTc of 539 ms, nonspecific T wave version in leads III, aVF, and V5, and no evidence of ST changes, including no evidence of ST elevation.  Imaging in the ED, per corresponding formal radiology read, was notable for the following: Noncontrast CT head showed no evidence of acute intracranial process, including no evidence of intracranial hemorrhage or any evidence of acute infarct.  CT cervical spine showed no evidence  of acute cervical spine fracture or subluxation injury.  Plan films of the right hip/pelvis showed no evidence of acute fracture.  While in the ED, the following were administered: Acetaminophen 650 mg p.o. x 1 dose, Coreg 25 mg p.o. x 1,  hydralazine 100 mg p.o. x 1, verapamil 360 mg p.o. x 1 dose, Toradol 15 mg IM x 1 dose, potassium chloride 40 mill colons p.o. x 1 dose, lactated Ringer's x 1 L bolus.  Subsequently, the patient was admitted for further evaluation management of presenting generalized weakness resolving and ground-level mechanical fall, with presenting labs notable for hypokalemia, mild leukocytosis, with presenting EKG demonstrating evidence of QTc prolongation.     Review of Systems: As per HPI otherwise 10 point review of systems negative.   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 He uses smokeless tobacco. He reports that he does not currently use alcohol. He reports that he does not currently use drugs after having used the following drugs: Marijuana, Cocaine, and IV.   No Known Allergies  Family History  Problem Relation Age of Onset   CAD Father        "heart attack in his 24's"   Hypertension Father    Diabetes Brother    Diabetes Sister    Hypertension Mother     Family history reviewed and not pertinent    Prior to Admission medications   Medication Sig Start Date End Date Taking? Authorizing Provider  ibuprofen (ADVIL) 800 MG tablet Take 800 mg by mouth 3 (three) times daily as needed.  11/06/23  Yes [provider]  albuterol (PROVENTIL HFA;VENTOLIN HFA) 108 (90 Base) MCG/ACT inhaler Inhale 1-2 puffs into the lungs every 6 (six) hours as needed for wheezing. 09/04/16   Rolland Porter, MD  aspirin EC 81 MG tablet Take 81 mg by mouth every morning.    [provider]  atorvastatin (LIPITOR) 40 MG tablet Take 1 tablet (40 mg total) by mouth daily. 03/06/23   Shalhoub, Deno Lunger, MD  carvedilol (COREG) 25 MG tablet Take 1 tablet (25 mg total) by mouth 2 (two) times daily with a meal. 03/05/23   Shalhoub, Deno Lunger, MD  diclofenac Sodium (VOLTAREN) 1 % GEL Apply 1 application. topically 4 (four) times daily as needed (pain). 12/27/21   [provider]  hydrALAZINE (APRESOLINE) 100 MG tablet Take 1 tablet (100 mg total) by mouth every 8 (eight) hours. 03/05/23   Shalhoub, Deno Lunger, MD  metFORMIN (GLUCOPHAGE) 500 MG tablet Take 1 tablet by mouth twice a day Patient taking differently: Take 500 mg by mouth 2 (two) times daily. 06/04/21     nicotine (NICODERM CQ - DOSED IN MG/24 HOURS) 21 mg/24hr patch  Place 1 patch (21 mg total) onto the skin daily. Patient taking differently: Place 21 mg onto the skin daily as needed (smoking cessation). 02/21/20   Rodolph Bong, MD  omeprazole (PRILOSEC) 20 MG capsule Take 1 capsule (20 mg total) by mouth daily. 12/14/22   Arby Barrette, MD  ondansetron (ZOFRAN-ODT) 8 MG disintegrating tablet Take 1 tablet (8 mg total) by mouth every 8 (eight) hours as needed for nausea or vomiting. 08/19/22   Margarita Grizzle, MD  sucralfate (CARAFATE) 1 GM/10ML suspension Take 10 mLs (1 g total) by mouth 4 (four) times daily -  with meals and at bedtime. 12/14/22   Arby Barrette, MD  tamsulosin (FLOMAX) 0.4 MG CAPS capsule TAKE TWO CAPSULES BY MOUTH AT BEDTIME 03/05/23   Shalhoub, Deno Lunger, MD  traMADol (ULTRAM) 50 MG tablet Take 1 tablet by mouth twice a day as needed for severe pains 06/04/21     verapamil (CALAN-SR) 180 MG CR tablet Take 2 tablets (360 mg  total) by mouth daily. 03/06/23   Marinda Elk, MD     Objective    Physical Exam: Vitals:   11/11/23 2105 11/11/23 2115 11/11/23 2145 11/11/23 2200  BP: (!) 103/54 117/83 (!) 115/53 (!) 118/51  Pulse: (!) 58 (!) 58 (!) 57 (!) 53  Resp: 15 17 16 15   Temp:      TempSrc:      SpO2: 97% 98% 96% 97%    General: appears to be stated age; alert, oriented Skin: warm, dry, no rash Head:  AT/[place] Mouth:  Oral mucosa membranes appear moist, normal dentition Neck: supple; trachea midline Heart:  RRR; did not appreciate any M/R/G Lungs: CTAB, did not appreciate any wheezes, rales, or rhonchi Abdomen: + BS; soft, ND, NT Vascular: 2+ pedal pulses b/l; 2+ radial pulses b/l Extremities: no peripheral edema, no muscle wasting Neuro: strength and sensation intact in upper and lower extremities b/l    Labs on Admission: I have personally reviewed following labs and imaging studies  CBC: Recent Labs  Lab 11/11/23 1730  WBC 11.3*  HGB 11.9*  HCT 37.9*  MCV 75.2*  PLT 253   Basic Metabolic Panel: Recent Labs  Lab 11/11/23 1730  NA 141  K 3.4*  CL 109  CO2 23  GLUCOSE 98  BUN 9  CREATININE 0.70  CALCIUM 8.9  MG 1.8   GFR: CrCl cannot be calculated (Unknown ideal weight.). Liver Function Tests: No results for input(s): "AST", "ALT", "ALKPHOS", "BILITOT", "PROT", "ALBUMIN" in the last 168 hours. No results for input(s): "LIPASE", "AMYLASE" in the last 168 hours. No results for input(s): "AMMONIA" in the last 168 hours. Coagulation Profile: No results for input(s): "INR", "PROTIME" in the last 168 hours. Cardiac Enzymes: No results for input(s): "CKTOTAL", "CKMB", "CKMBINDEX", "TROPONINI" in the last 168 hours. BNP (last 3 results) No results for input(s): "PROBNP" in the last 8760 hours. HbA1C: No results for input(s): "HGBA1C" in the last 72 hours. CBG: Recent Labs  Lab 11/11/23 1929  GLUCAP 149*   Lipid Profile: No results for input(s): "CHOL", "HDL",  "LDLCALC", "TRIG", "CHOLHDL", "LDLDIRECT" in the last 72 hours. Thyroid Function Tests: No results for input(s): "TSH", "T4TOTAL", "FREET4", "T3FREE", "THYROIDAB" in the last 72 hours. Anemia Panel: No results for input(s): "VITAMINB12", "FOLATE", "FERRITIN", "TIBC", "IRON", "RETICCTPCT" in the last 72 hours. Urine analysis:    Component Value Date/Time   COLORURINE YELLOW 10/24/2023 0227   APPEARANCEUR CLOUDY (A) 10/24/2023 0227   LABSPEC 1.034 (H) 10/24/2023 4098  PHURINE 5.0 10/24/2023 0227   GLUCOSEU NEGATIVE 10/24/2023 0227   HGBUR SMALL (A) 10/24/2023 0227   BILIRUBINUR NEGATIVE 10/24/2023 0227   KETONESUR NEGATIVE 10/24/2023 0227   PROTEINUR 30 (A) 10/24/2023 0227   UROBILINOGEN 2.0 (H) 08/06/2015 1209   NITRITE NEGATIVE 10/24/2023 0227   LEUKOCYTESUR LARGE (A) 10/24/2023 0227    Radiological Exams on Admission: DG Hip Unilat With Pelvis 2-3 Views Right  Result Date: 11/11/2023 CLINICAL DATA:  Pain after fall. EXAM: DG HIP (WITH OR WITHOUT PELVIS) 2-3V RIGHT COMPARISON:  Hip radiograph 04/01/2022 FINDINGS: No acute fracture of the pelvis or right hip. Femoral head is seated in the acetabulum, no hip dislocation. Joint spaces preserved. Pubic rami are intact. No pubic symphyseal or sacroiliac diastasis. There are prominent vascular calcifications. There may be mild lateral soft tissue edema. IMPRESSION: 1. No acute fracture of the pelvis or right hip. 2. Possible mild lateral soft tissue edema. Electronically Signed   By: Narda Rutherford M.D.   On: 11/11/2023 19:03   CT Cervical Spine Wo Contrast  Result Date: 11/11/2023 CLINICAL DATA:  Neck trauma EXAM: CT CERVICAL SPINE WITHOUT CONTRAST TECHNIQUE: Multidetector CT imaging of the cervical spine was performed without intravenous contrast. Multiplanar CT image reconstructions were also generated. RADIATION DOSE REDUCTION: This exam was performed according to the departmental dose-optimization program which includes automated  exposure control, adjustment of the mA and/or kV according to patient size and/or use of iterative reconstruction technique. COMPARISON:  04/01/2022 FINDINGS: Alignment: Normal Skull base and vertebrae: No fracture or focal bone lesion. Soft tissues and spinal canal: No traumatic soft tissue finding. Disc levels: No significant disc degeneration. No bony stenosis of the canal or foramina. No facet arthropathy. Upper chest: Mild emphysematous change and scarring. Other: None IMPRESSION: No acute or traumatic finding. Mild emphysematous change and scarring in the upper lungs. Emphysema (ICD10-J43.9). Electronically Signed   By: Paulina Fusi M.D.   On: 11/11/2023 16:47   CT Head Wo Contrast  Result Date: 11/11/2023 CLINICAL DATA:  Hypertension.  Head trauma, minor. EXAM: CT HEAD WITHOUT CONTRAST TECHNIQUE: Contiguous axial images were obtained from the base of the skull through the vertex without intravenous contrast. RADIATION DOSE REDUCTION: This exam was performed according to the departmental dose-optimization program which includes automated exposure control, adjustment of the mA and/or kV according to patient size and/or use of iterative reconstruction technique. COMPARISON:  04/01/2022 FINDINGS: Brain: No abnormality is seen affecting the brainstem or cerebellum. Right cerebral hemispheres normal. Left cerebral hemisphere shows an old frontal cortical and subcortical infarction. No evidence of acute infarction, mass lesion, hemorrhage, hydrocephalus or extra-axial collection. Vascular: There is atherosclerotic calcification of the major vessels at the base of the brain. Skull: Negative Sinuses/Orbits: Clear/normal Other: None IMPRESSION: No acute CT finding. 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 Electronically Signed   By: Paulina Fusi M.D.   On: 11/11/2023 16:45      Assessment/Plan    Principal Problem:   Generalized weakness Active Problems:   BPH (benign prostatic  hyperplasia)   DM2 (diabetes mellitus, type 2) (HCC)   Hypokalemia   Leukocytosis   Essential hypertension   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HCC)   Fall at home, initial encounter   Acute right hip pain   Prolonged QT interval     #) Generalized weakness:  2-3 day  duration of generalized weakness, in the absence of any evidence of acute focal neurologic deficits, including no evidence of acute focal weakness to suggest acute CVA  while noting that CT head showed no evidence of acute intracranial process, as above.  Etiology not entirely clear at this time.  Will further assess for contributory underlying infectious process by checking urinalysis.  Additionally, in the setting of outpatient use of high intensity atorvastatin, will also check CPK level.  Will further eval for any additional contributions from endocrine/metabolic sources, as detailed below.   Plan: PT/OT consults ordered for the AM. Fall precautions. CMP/CBC in the AM. Check TSH.  Check urinalysis, B12 level, CPK level.                  #) Ground level mechanical fall: it appears that the patient experienced a ground level fall yesterday that appears to be purely mechanical in nature, with patient conveying that they tripped while ambulating at home, without any preceding loss of consciousness. Not a/w any presyncope or chest pain.  He did hit his head as a component of this fall, and is on a daily baby aspirin at home.  However, today CT head showed no evidence of acute process, including no evidence of intracranial hemorrhage, will CT neck showed no evidence of acute fracture or subluxation injury.  He also notes resultant right hip pain, but no evidence of acute fracture on imaging thus far, as further detailed above.  While this fall appears to be purely mechanical in nature, will evaluate for underlying factors that may have predisposed patient to experience such a fall, including further  evaluation for any any underlying infectious contribution.  Plan: Fall precautions. Repeat CMP and CBC with differential in the morning.  PT/OT consults ordered. Check UA.                  #) Acute right hip pain: As consequence of yesterday's ground-level mechanical fall, as further detailed above.  No evidence of acute fracture or dislocation per presenting plain films of the right hip/pelvis, as outlined above.  No evidence of associated acute focal neurologic deficits, and the right lower extremity appears neurovascularly intact at this time.  Given his decline in blood pressure following resumption of home antihypertensive medications, will be conservative with opioid painkillers, but rather try to emphasize anti-inflammatory medications, including acetaminophen as outlined below.  Plan: Acetaminophen 1 g p.o. every 6 hours as needed.  Fall precautions ordered.  PT/OT consults ordered for the morning.  Further evaluation and management of contributory generalized weakness, as further detailed above.                 #) Hypokalemia: presenting potassium level noted to be 3.4.  He has received 40 mill equivalents of oral potassium chloride in the emergency department this evening.  Of note, corresponding magnesium level found to be 1.8.  Plan: monitor on tele.  CMP, mag level in the AM.                    #) Leukocytosis: Presenting CBC reflects mildly elevated white cell count of 11,300, which appears to be trending down from most recent prior value checked approximately 2 and half weeks ago, as further quantified above. Suspect an element of potential for a reactive contribution in the setting of his ground-level mechanical fall yesterday. no overt evidence to suggest underlying infectious process at this time, but will also check urinalysis to further assess.  The degree of elevation of the patient's white blood cell count does not meet the threshold of  12,000 for inclusion service criteria.  Additionally, no other  SIRS criteria met at this time. Overall, criteria for sepsis are not currently met. Appears hemodynamically stable.  Therefore, will refrain from initiation of antibiotics at this time.  Plan: Repeat CBC with diff in the morning.  Monitor strict I's and O's, daily weights. Check UA.                   #) QTc prolongation: Presenting EKG demonstrates QTc of  539  ms. No overt outpatient medications that may be contributing to QTc prolongation.  Serum magnesium level found to be 1.8  Plan: Monitor on telemetry.  magnesium level in the morning.  Repeat EKG in the morning to monitor interval degree of QTc prolongation.                   #) Essential Hypertension: documented h/o such, with outpatient antihypertensive regimen including Coreg, hydralazine, verapamil.  As noted/quantified above, initial blood pressures in the ED this evening were noted to be hypertensive, before interval decrease in blood pressure with resumption of outpatient hypertensive meds.  Consequently, will be conservative with timing of resumption of the patient's home and hypertensive medications, as further outlined above.  Plan: Hold home Coreg, hydralazine, verapamil while closely monitoring of subsequent BP via routine VS. monitor on telemetry.                   #) Type 2 Diabetes Mellitus: documented history of such. Home insulin regimen: None. Home oral hypoglycemic agents: Metformin. presenting blood sugar: 98. Most recent A1c noted to be 6.2% when checked in March 2024.  Plan: accuchecks QAC and HS with low dose SSI. hold home oral hypoglycemic agents during this hospitalization.  Add on hemoglobin A1c level.                 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  No clinical evidence to suggest acutely decompensated heart failure at this time.  Given the history of HOCM  physiology, will ensure appropriate euvolemia, but providing gentle additional IV fluids, as quantified below.  Given ensuing development of hypotension following resumption of home hypertensive medications, will hold this evening's beta-blocker dose, and reassess potential for resumption of outpatient Coreg in the morning.  Plan: Monitor strict I's and O's and daily weights.  Lactated Ringer's at 75 cc/h x 10 hours.  CMP, CBC in the morning.  Repeat serum magnesium level in the morning.  Holding additional Coreg this evening, as outlined above.                 #) Benign Prostatic Hyperplasia:  documented h/o such; on tamsulosin as outpatient.   Plan: monitor strict I's & O's and daily weights. Repeat CMP in AM. continue home tamsulosin.        DVT prophylaxis: SCD's   Code Status: Full code Family Communication: none Disposition Plan: Per Rounding Team Consults called: none;  Admission status: Inpatient    I SPENT GREATER THAN 75  MINUTES IN CLINICAL CARE TIME/MEDICAL DECISION-MAKING IN COMPLETING THIS ADMISSION.     Chaney Born Arvell Pulsifer DO [facility] From 7PM - 7AM   11/11/2023, 11:20 PM

## 2023-11-11 NOTE — ED Notes (Signed)
Unsuccessful IV attempt x2.  

## 2023-11-11 NOTE — ED Notes (Signed)
Pt. Being discharged, bp dropped to 73/45, RN and MD. Made aware.

## 2023-11-11 NOTE — ED Notes (Signed)
Notified triage RN of elevated BP 226/53. Per pt, he "did not take BP medication this morning, I didn't feel like doing anything".

## 2023-11-11 NOTE — ED Provider Notes (Addendum)
[place] [facility] DEPARTMENT AT [facility] Provider Note   CSN: [id number] Arrival date & time: 11/11/23  1516     History  Chief Complaint  Patient presents with   Tingling   Fall    [name] is a 72 y.o. male.  Patient is a 72 year old male with a past medical history of hypertension and diabetes presenting to the emergency department complaining of paresthesias as well as a fall.  Patient states that he had a fall last night, unsure how the fall actually happened and is unsure if he hit his head.  Reports he had brief loss of consciousness.  Patient states that he has been having a headache and "full body" paresthesias since.  He denies any nausea or vomiting or recent diarrhea but states that he has had a decreased appetite.  Denies any fevers.  He states he was recently treated for UTI.  He states he no longer has any dysuria or hematuria.  He states that he has not taken his medications today.  He states that he no longer drinks alcohol.  The history is provided by the patient.  Fall       Home Medications Prior to Admission medications   Medication Sig Start Date End Date Taking? Authorizing Provider  albuterol (PROVENTIL HFA;VENTOLIN HFA) 108 (90 Base) MCG/ACT inhaler Inhale 1-2 puffs into the lungs every 6 (six) hours as needed for wheezing. 09/04/16   Rolland Porter, MD  aspirin EC 81 MG tablet Take 81 mg by mouth every morning.    [provider]  atorvastatin (LIPITOR) 40 MG tablet Take 1 tablet (40 mg total) by mouth daily. 03/06/23   Shalhoub, Deno Lunger, MD  carvedilol (COREG) 25 MG tablet Take 1 tablet (25 mg total) by mouth 2 (two) times daily with a meal. 03/05/23   Shalhoub, Deno Lunger, MD  diclofenac Sodium (VOLTAREN) 1 % GEL Apply 1 application. topically 4 (four) times daily as needed (pain). 12/27/21   [provider]  hydrALAZINE (APRESOLINE) 100 MG tablet Take 1 tablet (100 mg total) by mouth every 8 (eight) hours. 03/05/23    Shalhoub, Deno Lunger, MD  metFORMIN (GLUCOPHAGE) 500 MG tablet Take 1 tablet by mouth twice a day Patient taking differently: Take 500 mg by mouth 2 (two) times daily. 06/04/21     nicotine (NICODERM CQ - DOSED IN MG/24 HOURS) 21 mg/24hr patch Place 1 patch (21 mg total) onto the skin daily. Patient taking differently: Place 21 mg onto the skin daily as needed (smoking cessation). 02/21/20   Rodolph Bong, MD  omeprazole (PRILOSEC) 20 MG capsule Take 1 capsule (20 mg total) by mouth daily. 12/14/22   Arby Barrette, MD  ondansetron (ZOFRAN-ODT) 8 MG disintegrating tablet Take 1 tablet (8 mg total) by mouth every 8 (eight) hours as needed for nausea or vomiting. 08/19/22   Margarita Grizzle, MD  sucralfate (CARAFATE) 1 GM/10ML suspension Take 10 mLs (1 g total) by mouth 4 (four) times daily -  with meals and at bedtime. 12/14/22   Arby Barrette, MD  tamsulosin (FLOMAX) 0.4 MG CAPS capsule TAKE TWO CAPSULES BY MOUTH AT BEDTIME 03/05/23   Shalhoub, Deno Lunger, MD  traMADol (ULTRAM) 50 MG tablet Take 1 tablet by mouth twice a day as needed for severe pains 06/04/21     verapamil (CALAN-SR) 180 MG CR tablet Take 2 tablets (360 mg total) by mouth daily. 03/06/23   Shalhoub, Deno Lunger, MD      Allergies  Patient has no known allergies.    Review of Systems   Review of Systems  Physical Exam Updated Vital Signs BP (!) 118/51   Pulse (!) 53   Temp 98.4 F (36.9 C) (Oral)   Resp 15   SpO2 97%  Physical Exam Vitals and nursing note reviewed.  Constitutional:      General: He is not in acute distress.    Appearance: Normal appearance.  HENT:     Head: Normocephalic and atraumatic.     Nose: Nose normal.     Mouth/Throat:     Mouth: Mucous membranes are moist.     Pharynx: Oropharynx is clear.  Eyes:     Extraocular Movements: Extraocular movements intact.     Conjunctiva/sclera: Conjunctivae normal.     Pupils: Pupils are equal, round, and reactive to light.  Neck:     Comments: No midline neck  tenderness Cardiovascular:     Rate and Rhythm: Normal rate and regular rhythm.     Heart sounds: Normal heart sounds.  Pulmonary:     Effort: Pulmonary effort is normal.     Breath sounds: Normal breath sounds.  Abdominal:     General: Abdomen is flat.     Palpations: Abdomen is soft.     Tenderness: There is no abdominal tenderness.  Musculoskeletal:        General: Normal range of motion.     Cervical back: Normal range of motion and neck supple.     Comments: No midline back tenderness No bony tenderness to bilateral upper or lower extremities Pain with bilateral hip flexion  Skin:    General: Skin is warm and dry.  Neurological:     General: No focal deficit present.     Mental Status: He is alert and oriented to person, place, and time.     Cranial Nerves: No cranial nerve deficit.     Sensory: No sensory deficit.     Motor: No weakness.  Psychiatric:        Mood and Affect: Mood normal.        Behavior: Behavior normal.     ED Results / Procedures / Treatments   Labs (all labs ordered are listed, but only abnormal results are displayed) Labs Reviewed  BASIC METABOLIC PANEL - Abnormal; Notable for the following components:      Result Value   Potassium 3.4 (*)    All other components within normal limits  CBC - Abnormal; Notable for the following components:   WBC 11.3 (*)    Hemoglobin 11.9 (*)    HCT 37.9 (*)    MCV 75.2 (*)    MCH 23.6 (*)    All other components within normal limits  CBG MONITORING, ED - Abnormal; Notable for the following components:   Glucose-Capillary 149 (*)    All other components within normal limits  MAGNESIUM  RAPID URINE DRUG SCREEN, HOSP PERFORMED    EKG EKG Interpretation Date/Time:  Wednesday November 11 2023 15:57:14 EST Ventricular Rate:  64 PR Interval:  173 QRS Duration:  92 QT Interval:  522 QTC Calculation: 539 R Axis:   -13  Text Interpretation: Sinus rhythm Probable left atrial enlargement Probable left  ventricular hypertrophy Anterior Q waves, possibly due to LVH Nonspecific T abnormalities, inferior leads new compared to prior EKG Prolonged QT interval Confirmed by Elayne Snare ([id number]) on 11/11/2023 4:01:42 PM  Radiology DG Hip Unilat With Pelvis 2-3 Views Right  Result Date: 11/11/2023 CLINICAL DATA:  Pain after fall. EXAM: DG HIP (WITH OR WITHOUT PELVIS) 2-3V RIGHT COMPARISON:  Hip radiograph 04/01/2022 FINDINGS: No acute fracture of the pelvis or right hip. Femoral head is seated in the acetabulum, no hip dislocation. Joint spaces preserved. Pubic rami are intact. No pubic symphyseal or sacroiliac diastasis. There are prominent vascular calcifications. There may be mild lateral soft tissue edema. IMPRESSION: 1. No acute fracture of the pelvis or right hip. 2. Possible mild lateral soft tissue edema. Electronically Signed   By: Narda Rutherford M.D.   On: 11/11/2023 19:03   CT Cervical Spine Wo Contrast  Result Date: 11/11/2023 CLINICAL DATA:  Neck trauma EXAM: CT CERVICAL SPINE WITHOUT CONTRAST TECHNIQUE: Multidetector CT imaging of the cervical spine was performed without intravenous contrast. Multiplanar CT image reconstructions were also generated. RADIATION DOSE REDUCTION: This exam was performed according to the departmental dose-optimization program which includes automated exposure control, adjustment of the mA and/or kV according to patient size and/or use of iterative reconstruction technique. COMPARISON:  04/01/2022 FINDINGS: Alignment: Normal Skull base and vertebrae: No fracture or focal bone lesion. Soft tissues and spinal canal: No traumatic soft tissue finding. Disc levels: No significant disc degeneration. No bony stenosis of the canal or foramina. No facet arthropathy. Upper chest: Mild emphysematous change and scarring. Other: None IMPRESSION: No acute or traumatic finding. Mild emphysematous change and scarring in the upper lungs. Emphysema (ICD10-J43.9). Electronically Signed    By: Paulina Fusi M.D.   On: 11/11/2023 16:47   CT Head Wo Contrast  Result Date: 11/11/2023 CLINICAL DATA:  Hypertension.  Head trauma, minor. EXAM: CT HEAD WITHOUT CONTRAST TECHNIQUE: Contiguous axial images were obtained from the base of the skull through the vertex without intravenous contrast. RADIATION DOSE REDUCTION: This exam was performed according to the departmental dose-optimization program which includes automated exposure control, adjustment of the mA and/or kV according to patient size and/or use of iterative reconstruction technique. COMPARISON:  04/01/2022 FINDINGS: Brain: No abnormality is seen affecting the brainstem or cerebellum. Right cerebral hemispheres normal. Left cerebral hemisphere shows an old frontal cortical and subcortical infarction. No evidence of acute infarction, mass lesion, hemorrhage, hydrocephalus or extra-axial collection. Vascular: There is atherosclerotic calcification of the major vessels at the base of the brain. Skull: Negative Sinuses/Orbits: Clear/normal Other: None IMPRESSION: No acute CT finding. 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 Electronically Signed   By: Paulina Fusi M.D.   On: 11/11/2023 16:45    Procedures Ultrasound ED Peripheral IV (Provider)  Date/Time: 11/11/2023 7:45 PM  Performed by: Rexford Maus, DO Authorized by: Rexford Maus, DO   Procedure details:    Indications: hypotension, multiple failed IV attempts and poor IV access     Skin Prep: chlorhexidine gluconate     Location:  Left anterior forearm   Angiocath:  20 G   Bedside Ultrasound Guided: Yes     Images: not archived     Patient tolerated procedure without complications: Yes     Dressing applied: Yes       Medications Ordered in ED Medications  acetaminophen (TYLENOL) tablet 650 mg (650 mg Oral Given 11/11/23 1732)  carvedilol (COREG) tablet 25 mg (25 mg Oral Given 11/11/23 1732)  hydrALAZINE (APRESOLINE) tablet 100 mg (100 mg  Oral Given 11/11/23 1729)  verapamil (CALAN-SR) CR tablet 360 mg (360 mg Oral Given 11/11/23 1732)  potassium chloride SA (KLOR-CON M) CR tablet 40 mEq (40 mEq Oral Given 11/11/23 1833)  ketorolac (TORADOL) 15 MG/ML injection 15 mg (  15 mg Intramuscular Given 11/11/23 1925)  lactated ringers bolus 1,000 mL (0 mLs Intravenous Stopped 11/11/23 2036)    ED Course/ Medical Decision Making/ A&P Clinical Course as of 11/11/23 2235  Wed Nov 11, 2023  1651 No acute traumatic injury on CTH/C-spine. [VK]  1811 Mild hypokalemia will be repleted, otherwise labs at baseline. [VK]  1811 XR read and urine pending at this time. [VK]  1921 Patient denies any urinary symptoms to me - UA cancelled. Reports still having a headache and will be given toradol. No acute bony abnormality on XR. May have mild concussion. He is stable for discharge home with outpatient follow up. [VK]  1943 RN notified me patient's BP dropped in to the 80's patient reports feeling dizzy. He is following commands without focal deficits but appears ill. Suspect hypotension related to BP meds. IV access was obtained and 1 L IVF bolus started. [VK]  2209 BP improved to 115 systolic on my evaluate in the room, improvement of symptoms. Will have ambulatory trial and if successful can be discharged. Patient instructed to not take BP meds in the morning and to slowly resume one medication at a time. [VK]  2223 Patient too weak to ambulate, he will be admitted for PT eval and BP monitoring. [VK]    Clinical Course User Index [VK] Rexford Maus, DO                                 Medical Decision Making This patient presents to the ED with chief complaint(s) of fall, paresthesias with pertinent past medical history of hypertension, diabetes which further complicates the presenting complaint. The complaint involves an extensive differential diagnosis and also carries with it a high risk of complications and morbidity.    The differential  diagnosis includes possible syncopal fall, ICH, mass effect, arrhythmia, anemia, dehydration, electrolyte abnormality, no focal neurologic deficits making CVA or TIA less likely  Additional history obtained: Additional history obtained from N/a Records reviewed outpatient vascular records and recent ED records  ED Course and Reassessment: On patient's arrival he is hypertensive and otherwise hemodynamically stable in no acute distress without focal neurologic deficits.  EKG on arrival showed normal sinus rhythm without acute ischemic changes.  Patient will do labs including electrolytes and will have CT head and C-spine as well as right hip x-rays to evaluate for traumatic injuries from his fall.  He was given Tylenol for his headache and was given his home blood pressure medications and will be closely reassessed.  Independent labs interpretation:  The following labs were independently interpreted: Mild hypokalemia otherwise within normal range  Independent visualization of imaging: - I independently visualized the following imaging with scope of interpretation limited to determining acute life threatening conditions related to emergency care: CT head/C-spine, right hip x-ray, which revealed no acute traumatic injury  Consultation: - Consulted or discussed management/test interpretation w/ external professional: hospitalist  Consideration for admission or further workup: patient will be admitted for persistent weakness   Social Determinants of health: N/A    Amount and/or Complexity of Data Reviewed Labs: ordered. Radiology: ordered.  Risk OTC drugs. Prescription drug management. Decision regarding hospitalization.      Final Clinical Impression(s) / ED Diagnoses Final diagnoses:  Fall, initial encounter  Paresthesias  Injury of head, initial encounter  Transient hypotension    Rx / DC Orders ED Discharge Orders     None  Rexford Maus,  DO 11/11/23 1923    Rexford Maus, DO 11/11/23 2235

## 2023-11-12 ENCOUNTER — Other Ambulatory Visit: Payer: Self-pay

## 2023-11-12 DIAGNOSIS — F1910 Other psychoactive substance abuse, uncomplicated: Secondary | ICD-10-CM | POA: Insufficient documentation

## 2023-11-12 DIAGNOSIS — E876 Hypokalemia: Secondary | ICD-10-CM | POA: Diagnosis not present

## 2023-11-12 DIAGNOSIS — R531 Weakness: Secondary | ICD-10-CM

## 2023-11-12 DIAGNOSIS — I5032 Chronic diastolic (congestive) heart failure: Secondary | ICD-10-CM | POA: Diagnosis not present

## 2023-11-12 DIAGNOSIS — I160 Hypertensive urgency: Secondary | ICD-10-CM | POA: Diagnosis not present

## 2023-11-12 LAB — URINALYSIS, COMPLETE (UACMP) WITH MICROSCOPIC
Bilirubin Urine: NEGATIVE
Glucose, UA: NEGATIVE mg/dL
Ketones, ur: NEGATIVE mg/dL
Nitrite: NEGATIVE
Protein, ur: 30 mg/dL — AB
Specific Gravity, Urine: 1.005 (ref 1.005–1.030)
WBC, UA: >50 WBC/hpf (ref 0–5)
pH: 6 (ref 5.0–8.0)

## 2023-11-12 LAB — CBC WITH DIFFERENTIAL/PLATELET
Abs Immature Granulocytes: 0.05 10*3/uL (ref 0.00–0.07)
Basophils Absolute: 0.1 10*3/uL (ref 0.0–0.1)
Basophils Relative: 0 %
Eosinophils Absolute: 0.4 10*3/uL (ref 0.0–0.5)
Eosinophils Relative: 4 %
HCT: 36 % — ABNORMAL LOW (ref 39.0–52.0)
Hemoglobin: 11.4 g/dL — ABNORMAL LOW (ref 13.0–17.0)
Immature Granulocytes: 0 %
Lymphocytes Relative: 25 %
Lymphs Abs: 2.9 10*3/uL (ref 0.7–4.0)
MCH: 24.1 pg — ABNORMAL LOW (ref 26.0–34.0)
MCHC: 31.7 g/dL (ref 30.0–36.0)
MCV: 75.9 fL — ABNORMAL LOW (ref 80.0–100.0)
Monocytes Absolute: 0.8 10*3/uL (ref 0.1–1.0)
Monocytes Relative: 7 %
Neutro Abs: 7.4 10*3/uL (ref 1.7–7.7)
Neutrophils Relative %: 64 %
Platelets: 258 10*3/uL (ref 150–400)
RBC: 4.74 MIL/uL (ref 4.22–5.81)
RDW: 14.4 % (ref 11.5–15.5)
WBC: 11.6 10*3/uL — ABNORMAL HIGH (ref 4.0–10.5)
nRBC: 0 % (ref 0.0–0.2)

## 2023-11-12 LAB — COMPREHENSIVE METABOLIC PANEL
ALT: 11 U/L (ref 0–44)
AST: 18 U/L (ref 15–41)
Albumin: 2.9 g/dL — ABNORMAL LOW (ref 3.5–5.0)
Alkaline Phosphatase: 52 U/L (ref 38–126)
Anion gap: 8 (ref 5–15)
BUN: 13 mg/dL (ref 8–23)
CO2: 24 mmol/L (ref 22–32)
Calcium: 9 mg/dL (ref 8.9–10.3)
Chloride: 109 mmol/L (ref 98–111)
Creatinine, Ser: 1 mg/dL (ref 0.61–1.24)
GFR, Estimated: >60 mL/min (ref >60)
Glucose, Bld: 182 mg/dL — ABNORMAL HIGH (ref 70–99)
Potassium: 4 mmol/L (ref 3.5–5.1)
Sodium: 141 mmol/L (ref 135–145)
Total Bilirubin: 0.4 mg/dL (ref <1.2)
Total Protein: 6.3 g/dL — ABNORMAL LOW (ref 6.5–8.1)

## 2023-11-12 LAB — HEMOGLOBIN A1C
Hgb A1c MFr Bld: 6.8 % — ABNORMAL HIGH (ref 4.8–5.6)
Mean Plasma Glucose: 148.46 mg/dL

## 2023-11-12 LAB — RAPID URINE DRUG SCREEN, HOSP PERFORMED
Amphetamines: NOT DETECTED
Barbiturates: NOT DETECTED
Benzodiazepines: NOT DETECTED
Cocaine: POSITIVE — AB
Opiates: POSITIVE — AB
Tetrahydrocannabinol: NOT DETECTED

## 2023-11-12 LAB — MAGNESIUM: Magnesium: 1.6 mg/dL — ABNORMAL LOW (ref 1.7–2.4)

## 2023-11-12 LAB — GLUCOSE, CAPILLARY
Glucose-Capillary: 83 mg/dL (ref 70–99)
Glucose-Capillary: 159 mg/dL — ABNORMAL HIGH (ref 70–99)

## 2023-11-12 LAB — TSH: TSH: 0.394 u[IU]/mL (ref 0.350–4.500)

## 2023-11-12 LAB — VITAMIN B12: Vitamin B-12: 416 pg/mL (ref 180–914)

## 2023-11-12 LAB — CK: Total CK: 73 U/L (ref 49–397)

## 2023-11-12 MED ORDER — MAGNESIUM SULFATE 2 GM/50ML IV SOLN
2.0000 g | Freq: Once | INTRAVENOUS | Status: AC
  Administered 2023-11-12: 2 g via INTRAVENOUS
  Filled 2023-11-12: qty 50

## 2023-11-12 MED ORDER — DICLOFENAC SODIUM 1 % EX GEL: 2.0000 g | Freq: Four times a day (QID) | CUTANEOUS | 11 refills | Status: AC

## 2023-11-12 MED ORDER — VERAPAMIL HCL ER 180 MG PO TBCR: 360.0000 mg | EXTENDED_RELEASE_TABLET | Freq: Every day | ORAL | 11 refills | Status: AC

## 2023-11-12 MED ORDER — ATORVASTATIN CALCIUM 40 MG PO TABS: 40.0000 mg | ORAL_TABLET | Freq: Every day | ORAL | 11 refills | Status: AC

## 2023-11-12 MED ORDER — TAMSULOSIN HCL 0.4 MG PO CAPS: 0.4000 mg | ORAL_CAPSULE | Freq: Every day | ORAL | 11 refills | Status: AC

## 2023-11-12 MED ORDER — METFORMIN HCL 500 MG PO TABS: 500.0000 mg | ORAL_TABLET | Freq: Two times a day (BID) | ORAL | 11 refills | Status: AC

## 2023-11-12 NOTE — ED Notes (Addendum)
Patient given sandwhich

## 2023-11-12 NOTE — Discharge Summary (Signed)
Physician Discharge Summary   Patient: [name] MRN: [id number] DOB: 1951/09/30  Admit date:     11/11/2023  Discharge date: 11/12/23  Discharge Physician: Alberteen Sam   PCP: Fleet Contras, MD     Recommendations at discharge:  Follow up with PCP Dr. Concepcion Elk Dr. Concepcion Elk: Please check BP and add hydralazine or otherwise titrate as appropriate; beta-blocker if abstinent from cocaine Please check CBC and BMP in 1 week     Discharge Diagnoses: Principal Problem:   Generalized weakness due to hypertensive urgency Active Problems:   Chronic heart failure with preserved ejection fraction (HFpEF) (HCC)   DM2 (diabetes mellitus, type 2) (HCC)   Hypokalemia   Hypomagnesemia   Essential hypertension   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HCC)   Acute right hip pain due to fall   Prolonged QT interval   Polysubstance abuse [facility])      Hospital Course: 72 y.o. M with dCHF, HOCM, hx CVA, DM, HTN, and polysubstance abuse who presented with syncope.     * Generalized weakness Relapsed recently.  Stopped taking his meds.  On day of admission, got "tingly all over" and had headache and generalized weakness and passed out/fell, so came to the ER.  In the ER, initially BP 220s/110s.  CT normal, neuro exam nonfocal.  Given home meds and was being prepared for DC when he was noted to he hypotensive.  Given fluids and again was being prepared for DC around midnight when he reported being too weak to go home so hospitalists asked to observe overnight.  CMP unremarkable, K 3.4, mag 1.6, Cr normal, CBC unremarkable, WBC 11K, Hgb 11.  UDS positive for cocaine, opiates.  Given fluids overnight and observed.  In the morning, mentation good, neurological exam good, worked with PT and was independent.  BP improved and so he was given refills of all medicines and recommended cocaine, opiate and alcohol cessation.            The [facility] Controlled  Substances Registry was reviewed for this patient prior to discharge.  Consultants: None Procedures performed: CT head CT C spine Hip radiograph   Disposition: Home Diet recommendation:  Discharge Diet Orders (From admission, onward)     Start     Ordered   11/12/23 0000  Diet - low sodium heart healthy        11/12/23 1207             DISCHARGE MEDICATION: Allergies as of 11/12/2023   No Known Allergies      Medication List     STOP taking these medications    carvedilol 25 MG tablet Commonly known as: COREG   hydrALAZINE 100 MG tablet Commonly known as: APRESOLINE   ibuprofen 800 MG tablet Commonly known as: ADVIL   sucralfate 1 GM/10ML suspension Commonly known as: Carafate       TAKE these medications    albuterol 108 (90 Base) MCG/ACT inhaler Commonly known as: VENTOLIN HFA Inhale 1-2 puffs into the lungs every 6 (six) hours as needed for wheezing.   aspirin EC 81 MG tablet Take 81 mg by mouth every morning.   atorvastatin 40 MG tablet Commonly known as: LIPITOR Take 1 tablet (40 mg total) by mouth daily.   diclofenac Sodium 1 % Gel Commonly known as: VOLTAREN Apply 2 g topically 4 (four) times daily. What changed:  how much to take when to take this reasons to take this   metFORMIN 500 MG tablet Commonly  known as: GLUCOPHAGE Take 1 tablet (500 mg total) by mouth 2 (two) times daily.   nicotine 21 mg/24hr patch Commonly known as: NICODERM CQ - dosed in mg/24 hours Place 1 patch (21 mg total) onto the skin daily. What changed:  when to take this reasons to take this   omeprazole 20 MG capsule Commonly known as: PRILOSEC Take 1 capsule (20 mg total) by mouth daily.   ondansetron 8 MG disintegrating tablet Commonly known as: ZOFRAN-ODT Take 1 tablet (8 mg total) by mouth every 8 (eight) hours as needed for nausea or vomiting.   tamsulosin 0.4 MG Caps capsule Commonly known as: FLOMAX Take 1 capsule (0.4 mg total) by mouth  daily after supper. What changed:  how much to take when to take this   traMADol 50 MG tablet Commonly known as: ULTRAM Take 1 tablet by mouth twice a day as needed for severe pains   verapamil 180 MG CR tablet Commonly known as: CALAN-SR Take 2 tablets (360 mg total) by mouth daily.        Follow-up Information     Fleet Contras, MD. Call in 3 days.   Specialty: Internal Medicine Contact information: [address] [place] [address] [phone]                 Discharge Instructions     Diet - low sodium heart healthy   Complete by: As directed    Discharge instructions   Complete by: As directed    **IMPORTANT DISCHARGE INSTRUCTIONS**   From Dr. Maryfrances Bunnell:  You were admitted for weakness  Here, we found that you had high blood pressure  Your brain imaging was normal, your x-ray of your hip (from where you fell) was normal  Your blood work was essentially normal, and not concerning.  Resume your blood pressure medicine verapamil 360 mg (two tabs) daily For now, HOLD (do not take) your carvedilol and hydralazine  Go see Dr. Concepcion Elk  Ask him if you should restart hydralazine or another blood pressure medicine  Avoid cocaine or other drugs, stop smoking   Increase activity slowly   Complete by: As directed        Discharge Exam: Filed Weights   11/12/23 0512  Weight: 76.8 kg    General: Pt is alert, awake, not in acute distress Cardiovascular: RRR, nl S1-S2, no murmurs appreciated.   No LE edema.   Respiratory: Normal respiratory rate and rhythm.  CTAB without rales or wheezes. Abdominal: Abdomen soft and non-tender.  No distension or HSM.   Neuro/Psych: Pupils equal and reactive, extraocular movements intact, no nystagmus, face symmetric, cranial nerves IX through XII equal, motor strength 5/5 in the upper and lower extremities bilaterally with normal motor bulk and tone, gait normal.  Romberg maneuver normal.  Sensory examination  intact to light touch in the face, extremities.  Finger-nose testing normal.  Patient is oriented to time, place, and person, speech is fluent, naming is grossly intact, attention span and concentration are within normal limits   Condition at discharge: fair  The results of significant diagnostics from this hospitalization (including imaging, microbiology, ancillary and laboratory) are listed below for reference.   Imaging Studies: DG Hip Unilat With Pelvis 2-3 Views Right  Result Date: 11/11/2023 CLINICAL DATA:  Pain after fall. EXAM: DG HIP (WITH OR WITHOUT PELVIS) 2-3V RIGHT COMPARISON:  Hip radiograph 04/01/2022 FINDINGS: No acute fracture of the pelvis or right hip. Femoral head is seated in the acetabulum, no hip dislocation.  Joint spaces preserved. Pubic rami are intact. No pubic symphyseal or sacroiliac diastasis. There are prominent vascular calcifications. There may be mild lateral soft tissue edema. IMPRESSION: 1. No acute fracture of the pelvis or right hip. 2. Possible mild lateral soft tissue edema. Electronically Signed   By: Narda Rutherford M.D.   On: 11/11/2023 19:03   CT Cervical Spine Wo Contrast  Result Date: 11/11/2023 CLINICAL DATA:  Neck trauma EXAM: CT CERVICAL SPINE WITHOUT CONTRAST TECHNIQUE: Multidetector CT imaging of the cervical spine was performed without intravenous contrast. Multiplanar CT image reconstructions were also generated. RADIATION DOSE REDUCTION: This exam was performed according to the departmental dose-optimization program which includes automated exposure control, adjustment of the mA and/or kV according to patient size and/or use of iterative reconstruction technique. COMPARISON:  04/01/2022 FINDINGS: Alignment: Normal Skull base and vertebrae: No fracture or focal bone lesion. Soft tissues and spinal canal: No traumatic soft tissue finding. Disc levels: No significant disc degeneration. No bony stenosis of the canal or foramina. No facet arthropathy.  Upper chest: Mild emphysematous change and scarring. Other: None IMPRESSION: No acute or traumatic finding. Mild emphysematous change and scarring in the upper lungs. Emphysema (ICD10-J43.9). Electronically Signed   By: Paulina Fusi M.D.   On: 11/11/2023 16:47   CT Head Wo Contrast  Result Date: 11/11/2023 CLINICAL DATA:  Hypertension.  Head trauma, minor. EXAM: CT HEAD WITHOUT CONTRAST TECHNIQUE: Contiguous axial images were obtained from the base of the skull through the vertex without intravenous contrast. RADIATION DOSE REDUCTION: This exam was performed according to the departmental dose-optimization program which includes automated exposure control, adjustment of the mA and/or kV according to patient size and/or use of iterative reconstruction technique. COMPARISON:  04/01/2022 FINDINGS: Brain: No abnormality is seen affecting the brainstem or cerebellum. Right cerebral hemispheres normal. Left cerebral hemisphere shows an old frontal cortical and subcortical infarction. No evidence of acute infarction, mass lesion, hemorrhage, hydrocephalus or extra-axial collection. Vascular: There is atherosclerotic calcification of the major vessels at the base of the brain. Skull: Negative Sinuses/Orbits: Clear/normal Other: None IMPRESSION: No acute CT finding. 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 Electronically Signed   By: Paulina Fusi M.D.   On: 11/11/2023 16:45   US SCROTUM W/DOPPLER  Result Date: 10/24/2023 CLINICAL DATA:  72 year old male history of testicular pain for the past 2 days. EXAM: SCROTAL ULTRASOUND DOPPLER ULTRASOUND OF THE TESTICLES TECHNIQUE: Complete ultrasound examination of the testicles, epididymis, and other scrotal structures was performed. Color and spectral Doppler ultrasound were also utilized to evaluate blood flow to the testicles. COMPARISON:  No priors. FINDINGS: Right testicle Measurements: 3.6 x 2.6 x 3.8 cm. No mass or microlithiasis visualized. Left testicle  Measurements: 4.2 x 2.3 x 2.4 cm. No mass or microlithiasis visualized. Right epididymis:  Normal in size and appearance. Left epididymis:  Normal in size and appearance. Hydrocele: Small to moderate right-sided hydrocele. No left-sided hydrocele. Varicocele:  Right-sided varicocele. Pulsed Doppler interrogation of both testes demonstrates normal low resistance arterial and venous waveforms bilaterally. IMPRESSION: 1. Small to moderate right-sided hydrocele, with large right varicocele. 2. No other acute findings. Specifically, no evidence of epididymo-orchitis or findings to suggest testicular torsion. Electronically Signed   By: Trudie Reed M.D.   On: 10/24/2023 10:26   CT ABDOMEN PELVIS W CONTRAST  Result Date: 10/24/2023 CLINICAL DATA:  Lower abdominal pain radiating to the groin for 2 days. EXAM: CT ABDOMEN AND PELVIS WITH CONTRAST TECHNIQUE: Multidetector CT imaging of the abdomen and pelvis  was performed using the standard protocol following bolus administration of intravenous contrast. RADIATION DOSE REDUCTION: This exam was performed according to the departmental dose-optimization program which includes automated exposure control, adjustment of the mA and/or kV according to patient size and/or use of iterative reconstruction technique. CONTRAST:  OMNIPAQUE IOHEXOL 300 MG/ML  SOLN COMPARISON:  CT abdomen and pelvis 07/19/2023 FINDINGS: Lower chest: No acute abnormality. Hepatobiliary: Hepatic steatosis. Normal gallbladder. No biliary dilation. Pancreas: Unremarkable. Spleen: Unremarkable. Adrenals/Urinary Tract: Normal adrenal glands. No urinary calculi or hydronephrosis. Diffuse bladder wall thickening large superior bladder diverticulum. Additional posterior left bladder diverticulum. Stomach/Bowel: Normal caliber large and small bowel. Colonic diverticulosis without diverticulitis. No bowel wall thickening. The appendix is normal.Stomach is within normal limits. Vascular/Lymphatic: Aortic  atherosclerosis. No enlarged abdominal or pelvic lymph nodes. Reproductive: Unremarkable prostate. Heterogenous enhancement and hydrocele is partially visualized in the right scrotum. Other: No free intraperitoneal fluid or air. Musculoskeletal: No acute fracture. IMPRESSION: 1. Diffuse bladder wall thickening with diverticula. 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 Correlate with urinalysis for cystitis. 2. Heterogenous enhancement and hydrocele is partially visualized in the right scrotum. Recommend scrotal ultrasound for further evaluation. 3. Hepatic steatosis. Aortic Atherosclerosis (ICD10-I70.0). Electronically Signed   By: Minerva Fester M.D.   On: 10/24/2023 02:54    Microbiology: Results for orders placed or performed in visit on 07/16/23  Urine Culture     Status: Abnormal   Collection Time: 07/16/23  3:30 PM  Result Value Ref Range Status   MICRO NUMBER: [id number]  Final   SPECIMEN QUALITY: Adequate  Final   Sample Source BLADDER  Final   STATUS: FINAL  Final   ISOLATE 1: Enterococcus faecalis (A)  Final    Comment: Greater than 100,000 CFU/mL of Enterococcus faecalis      Susceptibility   Enterococcus faecalis - URINE CULTURE POSITIVE 1    AMPICILLIN <=2 Sensitive     VANCOMYCIN 1 Sensitive     NITROFURANTOIN* <=16 Sensitive      * Legend: S = Susceptible  I = Intermediate R = Resistant  NS = Not susceptible * = Not tested  NR = Not reported **NN = See antimicrobic comments     Labs: CBC: Recent Labs  Lab 11/11/23 1730 11/12/23 0456  WBC 11.3* 11.6*  NEUTROABS  --  7.4  HGB 11.9* 11.4*  HCT 37.9* 36.0*  MCV 75.2* 75.9*  PLT 253 258   Basic Metabolic Panel: Recent Labs  Lab 11/11/23 1730 11/12/23 0456  NA 141 141  K 3.4* 4.0  CL 109 109  CO2 23 24  GLUCOSE 98 182*  BUN 9 13  CREATININE 0.70 1.00  CALCIUM 8.9 9.0  MG 1.8 1.6*   Liver Function Tests: Recent Labs  Lab 11/12/23 0456  AST 18  ALT 11  ALKPHOS 52  BILITOT 0.4   PROT 6.3*  ALBUMIN 2.9*   CBG: Recent Labs  Lab 11/11/23 1929 11/12/23 0754 11/12/23 1239  GLUCAP 149* 83 159*    Discharge time spent: approximately 35 minutes spent on discharge counseling, evaluation of patient on day of discharge, and coordination of discharge planning with nursing, social work, pharmacy and case management  Signed: Alberteen Sam, MD [facility] 11/12/2023

## 2023-11-12 NOTE — ED Notes (Signed)
Pt was able to ambulate to bathroom, although a little unsteady. Urine sample collected

## 2023-11-12 NOTE — TOC Transition Note (Signed)
Transition of Care [facility]) - CM/SW Discharge Note   Patient Details  Name: JOBY WINT MRN: [id number] Date of Birth: 03-19-1951  Transition of Care [facility]) CM/SW Contact:  Adrian Prows, RN Phone Number: 11/12/2023, 12:20 PM   Clinical Narrative:    D/C orders received; no TOC needs.   Final next level of care: Home/Self Care Barriers to Discharge: No Barriers Identified   Patient Goals and CMS Choice CMS Medicare.gov Compare Post Acute Care list provided to:: Patient Choice offered to / list presented to : Patient  Discharge Placement                         Discharge Plan and Services Additional resources added to the After Visit Summary for     Discharge Planning Services: CM Consult            DME Arranged: N/A DME Agency: NA       HH Arranged: NA HH Agency: NA        Social Determinants of Health (SDOH) Interventions SDOH Screenings   Food Insecurity: Food Insecurity Present (11/12/2023)  Housing: Medium Risk (11/12/2023)  Transportation Needs: No Transportation Needs (11/12/2023)  Utilities: At Risk (11/12/2023)  Tobacco Use: High Risk (11/11/2023)     Readmission Risk Interventions    11/12/2023   10:19 AM  Readmission Risk Prevention Plan  Transportation Screening Complete  PCP or Specialist Appt within 5-7 Days Complete  Home Care Screening Complete  Medication Review (RN CM) Complete

## 2023-11-12 NOTE — TOC Initial Note (Signed)
Transition of Care [facility]) - Initial/Assessment Note    Patient Details  Name: [name] MRN: [id number] Date of Birth: July 07, 1951  [facility]) CM/SW Contact:    Adrian Prows, RN Phone Number: 11/12/2023, 10:22 AM  Clinical Narrative:                 TOC for SDOH risks; spoke w/ pt in room; pt says he lives at home and plans to return at d/c; pt verified PCP and insurance; pt identified POC sister Ashok Pall [phone]); he has transportation, and denies IPV; pt says he has difficulty paying for his food and housing; he says his utilities have been cut off; he says he does not work, and he receives food stamps; pt agrees to receive resources for social services, and financial assistance; pt says he has glasses, and dentures (upper/lower). He also has cane, walker, and shower chair; he has grab bars in shower; resources placed in d/c instructions: [facility] [employer] in [place], and [facility] [employer] in [place] for social services, financial assistance; pt also given copy or resources; he will make appt at agencies of choice; awaiting PT/OT eval.  Expected Discharge Plan: Home/Self Care Barriers to Discharge: Continued Medical Work up   Patient Goals and CMS Choice Patient states their goals for this hospitalization and ongoing recovery are:: home CMS Medicare.gov Compare Post Acute Care list provided to:: Patient Choice offered to / list presented to : Patient      Expected Discharge Plan and Services   Discharge Planning Services: CM Consult                                          Prior Living Arrangements/Services     Patient language and need for interpreter reviewed:: Yes Do you feel safe going back to the place where you live?: Yes      Need for Family Participation in Patient Care: Yes (Comment) Care giver support system in place?: Yes (comment) Current home services: DME (cane, walker,  shower chair) Criminal Activity/Legal Involvement Pertinent to Current Situation/Hospitalization: No - Comment as needed  Activities of Daily Living   ADL Screening (condition at time of admission) Independently performs ADLs?: Yes (appropriate for developmental age) Is the patient deaf or have difficulty hearing?: No Does the patient have difficulty seeing, even when wearing glasses/contacts?: No Does the patient have difficulty concentrating, remembering, or making decisions?: No  Permission Sought/Granted Permission sought to share information with : Case Manager Permission granted to share information with : Yes, Verbal Permission Granted  Share Information with NAME: Case Manager     Permission granted to share info w Relationship: Ashok Pall (sister) [phone]     Emotional Assessment Appearance:: Appears stated age Attitude/Demeanor/Rapport: Gracious Affect (typically observed): Accepting Orientation: : Oriented to Self, Oriented to Place, Oriented to  Time, Oriented to Situation Alcohol / Substance Use: Not Applicable Psych Involvement: No (comment)  Admission diagnosis:  Paresthesias [R20.2] Generalized weakness [R53.1] Transient hypotension [I95.9] Injury of head, initial encounter [S09.90XA] Fall, initial encounter [W19.XXXA] Patient Active Problem List   Diagnosis Date Noted   Polysubstance abuse (HCC) 11/12/2023   Generalized weakness 11/11/2023   Acute right hip pain 11/11/2023   Prolonged QT interval 11/11/2023   Atherosclerosis of native arteries of extremity with intermittent claudication (HCC) 09/01/2023   Elevated troponin level  not due myocardial infarction 03/04/2023   Mixed hyperlipidemia due to type 2 diabetes mellitus (HCC) 03/04/2023   Nicotine dependence, cigarettes, uncomplicated 03/04/2023   Acute urinary retention 03/03/2023   Oropharyngeal candidiasis 03/03/2023   BPH (benign prostatic hyperplasia) 03/02/2023   Hyperlipidemia  03/02/2023   Acute exacerbation of CHF (congestive heart failure) (HCC) 03/01/2023   Hypertensive emergency 03/01/2023   Hypomagnesemia 03/01/2023   E-coli UTI 03/22/2022   Hyponatremia    CVA (cerebral vascular accident) (HCC)    Complicated UTI (urinary tract infection) 02/18/2020   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HCC) 02/18/2020   Epigastric abdominal pain    Epigastric pain 02/16/2020   Hypertensive urgency 02/16/2020   Cardiac murmur 02/16/2020   Acute pyelonephritis 02/16/2020   Syncope 01/23/2017   Elevated troponin    Nausea 11/01/2016   Dysuria 11/01/2016   Chronic heart failure with preserved ejection fraction (HFpEF) (HCC) 03/20/2015   Chest pain 03/18/2015   History of TIA (transient ischemic attack) 01/27/2015   Enterococcus UTI 01/27/2015   Cough 01/27/2015   Bronchitis 01/27/2015   HCAP (healthcare-associated pneumonia)    CHF (congestive heart failure) (HCC) 01/26/2015   History of alcohol abuse 01/26/2015   Pyelonephritis 01/12/2015   Prostatitis 01/12/2015   Essential hypertension 11/03/2014   Hypokalemia 11/03/2014   Fever 08/15/2014   Chronic pain 12/24/2013   Tobacco use disorder 12/10/2013   DM2 (diabetes mellitus, type 2) (HCC) 09/06/2012   Pain in the side 09/06/2012   Pituitary adenoma (HCC) 09/06/2012   PCP:  Fleet Contras, MD Pharmacy:   [facility] [phone] [place], [place] - [address] AT [address] [place] [address] Phone: [phone] Fax: [phone]  [facility] - [address], [place] - [address] [place] [address] Phone: [phone] Fax: [phone]  [facility] - [place], [place] - [address] [place] [address] Phone: [phone] Fax: [phone]     Social Determinants of Health (SDOH) Social History: SDOH Screenings   Food Insecurity: Food Insecurity Present (11/12/2023)   Housing: Medium Risk (11/12/2023)  Transportation Needs: No Transportation Needs (11/12/2023)  Utilities: At Risk (11/12/2023)  Tobacco Use: High Risk (11/11/2023)   SDOH Interventions: Food Insecurity Interventions: Inpatient TOC, Other (Comment) (resources given to patient) Housing Interventions: Inpatient TOC, Other (Comment) (resources given) Transportation Interventions: Intervention Not Indicated, Inpatient TOC Utilities Interventions: Inpatient TOC, Other (Comment) (resources given)   Readmission Risk Interventions    11/12/2023   10:19 AM  Readmission Risk Prevention Plan  Transportation Screening Complete  PCP or Specialist Appt within 5-7 Days Complete  Home Care Screening Complete  Medication Review (RN CM) Complete

## 2023-11-12 NOTE — Evaluation (Signed)
Physical Therapy Evaluation Patient Details Name: [name] MRN: [id number] DOB: April 18, 1951 Today's Date: 11/12/2023  History of Present Illness  [name] is a 72 y.o. male with medical history significant for essential hypertension,TIA/Stroke type 2 diabetes mellitus, HOCM, who is admitted to [facility] on 11/11/2023 with generalized weakness after presenting from home to [facility] ED complaining of generalized weakness,hypokalemia, mild leukocytosis, with presenting EKG demonstrating evidence of QTc prolongation.  Clinical Impression  The  patient ambulates independently with no device. Patient  reports no  needs  at this time for mobility, Has a RW .  PT will sign off.        If plan is discharge home, recommend the following: Assistance with cooking/housework;Assist for transportation   Can travel by private vehicle    yes    Equipment Recommendations None recommended by PT  Recommendations for Other Services       Functional Status Assessment       Precautions / Restrictions Precautions Precautions: Fall      Mobility  Bed Mobility Overal bed mobility: Independent                  Transfers Overall transfer level: Independent                      Ambulation/Gait Ambulation/Gait assistance: Independent Gait Distance (Feet): 100 Feet Assistive device: None Gait Pattern/deviations: WFL(Within Functional Limits)          Stairs            Wheelchair Mobility     Tilt Bed    Modified Rankin (Stroke Patients Only)       Balance Overall balance assessment: No apparent balance deficits (not formally assessed)                                           Pertinent Vitals/Pain Pain Assessment Pain Assessment: No/denies pain    Home Living Family/patient expects to be discharged to:: Private residence Living Arrangements: Alone Available Help at Discharge: Family;Friend(s);Available  PRN/intermittently Type of Home: House Home Access: Stairs to enter   Entrance Stairs-Number of Steps: 2-3     Home Equipment: Agricultural consultant (2 wheels)      Prior Function Prior Level of Function : Independent/Modified Independent;Driving;History of Falls (last six months)                     Extremity/Trunk Assessment   Upper Extremity Assessment Upper Extremity Assessment: Overall WFL for tasks assessed    Lower Extremity Assessment Lower Extremity Assessment: Overall WFL for tasks assessed    Cervical / Trunk Assessment Cervical / Trunk Assessment: Normal  Communication   Communication Communication: No apparent difficulties  Cognition Arousal: Alert Behavior During Therapy: WFL for tasks assessed/performed Overall Cognitive Status: Within Functional Limits for tasks assessed                                 General Comments: except stated Wednesday, correct date        General Comments      Exercises     Assessment/Plan    PT Assessment Patient does not need any further PT services  PT Problem List         PT Treatment Interventions  PT Goals (Current goals can be found in the Care Plan section)  Acute Rehab PT Goals Patient Stated Goal: go home PT Goal Formulation: All assessment and education complete, DC therapy    Frequency       Co-evaluation               AM-PAC PT "6 Clicks" Mobility  Outcome Measure Help needed turning from your back to your side while in a flat bed without using bedrails?: None Help needed moving from lying on your back to sitting on the side of a flat bed without using bedrails?: None Help needed moving to and from a bed to a chair (including a wheelchair)?: None Help needed standing up from a chair using your arms (e.g., wheelchair or bedside chair)?: None Help needed to walk in hospital room?: None Help needed climbing 3-5 steps with a railing? : None 6 Click Score: 24    End of  Session Equipment Utilized During Treatment: Gait belt Activity Tolerance: Patient tolerated treatment well Patient left: in bed;with bed alarm set Nurse Communication: Mobility status PT Visit Diagnosis: Repeated falls (R29.6)    Time: 1610-9604 PT Time Calculation (min) (ACUTE ONLY): 16 min   Charges:   PT Evaluation $PT Eval Low Complexity: 1 Low   PT General Charges $$ ACUTE PT VISIT: 1 Visit         Blanchard Kelch PT Acute Rehabilitation Services Office [phone] Weekend [phone]   Rada Hay 11/12/2023, 11:09 AM

## 2023-11-12 NOTE — Hospital Course (Signed)
72 y.o. M with dCHF, HOCM, hx CVA, DM, HTN, and polysubstance abuse who presented with syncope.

## 2023-11-12 NOTE — Assessment & Plan Note (Signed)
Relapsed recently.  Stopped taking his meds.  Then got "tingly all over" and had headache and generalized weakness and passed out/fell, so came to the ER.  In the ER, initially BP 220s/110s.  Given home meds and was being prepared for DC when he was noted to he hypotensive.  Given fluids and again was being prepared for DC around midnight when he reported too weak to go home.    CMP unremarkable, K 3.4, mag 1.6, Cr normal, CBC unremarkable, WBC 11K, Hgb 11.

## 2023-11-12 NOTE — ED Notes (Signed)
Pt was changed into gown repositioned in bed given clean blankets. Attempted to do condom cath, pt refused and stated " I think I can go to the bathroom in a urinal."

## 2023-11-12 NOTE — Care Management CC44 (Signed)
Condition Code 44 Documentation Completed  Patient Details  Name: ARPAD HALDER MRN: [id number] Date of Birth: 1951/12/09   Condition Code 44 given:  Yes Patient signature on Condition Code 44 notice:  Yes Documentation of 2 MD's agreement:  Yes Code 44 added to claim:  Yes    Adrian Prows, RN 11/12/2023, 10:14 AM

## 2023-11-12 NOTE — Care Management Obs Status (Signed)
MEDICARE OBSERVATION STATUS NOTIFICATION   Patient Details  Name: [name] MRN: [id number] Date of Birth: Nov 30, 1951   Medicare Observation Status Notification Given:  Yes    Adrian Prows, RN 11/12/2023, 10:14 AM

## 2023-12-01 ENCOUNTER — Ambulatory Visit (HOSPITAL_COMMUNITY): Admission: RE | Admit: 2023-12-01 | Payer: Medicare HMO | Source: Ambulatory Visit

## 2023-12-01 ENCOUNTER — Ambulatory Visit: Payer: Medicare HMO | Admitting: Vascular Surgery

## 2023-12-02 ENCOUNTER — Other Ambulatory Visit: Payer: Self-pay

## 2023-12-02 ENCOUNTER — Emergency Department (HOSPITAL_COMMUNITY): Payer: Medicare HMO

## 2023-12-02 ENCOUNTER — Inpatient Hospital Stay (HOSPITAL_COMMUNITY)
Admission: EM | Admit: 2023-12-02 | Discharge: 2023-12-04 | DRG: 690 | Disposition: A | Discharge status: Home / Self Care | Payer: Medicare HMO | Attending: Family Medicine | Admitting: Family Medicine

## 2023-12-02 DIAGNOSIS — F1910 Other psychoactive substance abuse, uncomplicated: Secondary | ICD-10-CM | POA: Diagnosis present

## 2023-12-02 DIAGNOSIS — N390 Urinary tract infection, site not specified: Secondary | ICD-10-CM | POA: Diagnosis present

## 2023-12-02 DIAGNOSIS — G8929 Other chronic pain: Secondary | ICD-10-CM | POA: Diagnosis present

## 2023-12-02 DIAGNOSIS — E782 Mixed hyperlipidemia: Secondary | ICD-10-CM | POA: Diagnosis present

## 2023-12-02 DIAGNOSIS — F17210 Nicotine dependence, cigarettes, uncomplicated: Secondary | ICD-10-CM | POA: Diagnosis present

## 2023-12-02 DIAGNOSIS — N3080 Other cystitis without hematuria: Secondary | ICD-10-CM | POA: Diagnosis not present

## 2023-12-02 DIAGNOSIS — M549 Dorsalgia, unspecified: Secondary | ICD-10-CM | POA: Diagnosis present

## 2023-12-02 DIAGNOSIS — Z7982 Long term (current) use of aspirin: Secondary | ICD-10-CM

## 2023-12-02 DIAGNOSIS — Z833 Family history of diabetes mellitus: Secondary | ICD-10-CM

## 2023-12-02 DIAGNOSIS — I160 Hypertensive urgency: Principal | ICD-10-CM | POA: Diagnosis present

## 2023-12-02 DIAGNOSIS — N3000 Acute cystitis without hematuria: Secondary | ICD-10-CM

## 2023-12-02 DIAGNOSIS — I421 Obstructive hypertrophic cardiomyopathy: Secondary | ICD-10-CM | POA: Diagnosis present

## 2023-12-02 DIAGNOSIS — Z8673 Personal history of transient ischemic attack (TIA), and cerebral infarction without residual deficits: Secondary | ICD-10-CM

## 2023-12-02 DIAGNOSIS — I5032 Chronic diastolic (congestive) heart failure: Secondary | ICD-10-CM | POA: Diagnosis present

## 2023-12-02 DIAGNOSIS — E1169 Type 2 diabetes mellitus with other specified complication: Secondary | ICD-10-CM | POA: Diagnosis present

## 2023-12-02 DIAGNOSIS — Z8249 Family history of ischemic heart disease and other diseases of the circulatory system: Secondary | ICD-10-CM

## 2023-12-02 DIAGNOSIS — R001 Bradycardia, unspecified: Secondary | ICD-10-CM | POA: Diagnosis present

## 2023-12-02 DIAGNOSIS — R54 Age-related physical debility: Secondary | ICD-10-CM | POA: Diagnosis present

## 2023-12-02 DIAGNOSIS — Z7984 Long term (current) use of oral hypoglycemic drugs: Secondary | ICD-10-CM

## 2023-12-02 DIAGNOSIS — E119 Type 2 diabetes mellitus without complications: Secondary | ICD-10-CM

## 2023-12-02 DIAGNOSIS — R519 Headache, unspecified: Secondary | ICD-10-CM | POA: Diagnosis present

## 2023-12-02 DIAGNOSIS — N400 Enlarged prostate without lower urinary tract symptoms: Secondary | ICD-10-CM | POA: Diagnosis present

## 2023-12-02 DIAGNOSIS — F1410 Cocaine abuse, uncomplicated: Secondary | ICD-10-CM | POA: Diagnosis present

## 2023-12-02 DIAGNOSIS — R200 Anesthesia of skin: Secondary | ICD-10-CM | POA: Diagnosis not present

## 2023-12-02 DIAGNOSIS — I110 Hypertensive heart disease with heart failure: Secondary | ICD-10-CM | POA: Diagnosis present

## 2023-12-02 DIAGNOSIS — F1110 Opioid abuse, uncomplicated: Secondary | ICD-10-CM | POA: Diagnosis present

## 2023-12-02 DIAGNOSIS — Z79899 Other long term (current) drug therapy: Secondary | ICD-10-CM

## 2023-12-02 LAB — HEPATIC FUNCTION PANEL
ALT: 19 U/L (ref 0–44)
AST: 23 U/L (ref 15–41)
Albumin: 3.5 g/dL (ref 3.5–5.0)
Alkaline Phosphatase: 59 U/L (ref 38–126)
Bilirubin, Direct: 0.1 mg/dL (ref 0.0–0.2)
Indirect Bilirubin: 0.3 mg/dL (ref 0.3–0.9)
Total Bilirubin: 0.4 mg/dL (ref <1.2)
Total Protein: 6.9 g/dL (ref 6.5–8.1)

## 2023-12-02 LAB — CBC WITH DIFFERENTIAL/PLATELET
Abs Immature Granulocytes: 0.03 10*3/uL (ref 0.00–0.07)
Basophils Absolute: 0.1 10*3/uL (ref 0.0–0.1)
Basophils Relative: 1 %
Eosinophils Absolute: 0.4 10*3/uL (ref 0.0–0.5)
Eosinophils Relative: 4 %
HCT: 41.4 % (ref 39.0–52.0)
Hemoglobin: 13.1 g/dL (ref 13.0–17.0)
Immature Granulocytes: 0 %
Lymphocytes Relative: 31 %
Lymphs Abs: 3.4 10*3/uL (ref 0.7–4.0)
MCH: 23.7 pg — ABNORMAL LOW (ref 26.0–34.0)
MCHC: 31.6 g/dL (ref 30.0–36.0)
MCV: 74.9 fL — ABNORMAL LOW (ref 80.0–100.0)
Monocytes Absolute: 0.6 10*3/uL (ref 0.1–1.0)
Monocytes Relative: 6 %
Neutro Abs: 6.6 10*3/uL (ref 1.7–7.7)
Neutrophils Relative %: 58 %
Platelets: 302 10*3/uL (ref 150–400)
RBC: 5.53 MIL/uL (ref 4.22–5.81)
RDW: 14.9 % (ref 11.5–15.5)
WBC: 11.2 10*3/uL — ABNORMAL HIGH (ref 4.0–10.5)
nRBC: 0 % (ref 0.0–0.2)

## 2023-12-02 LAB — URINALYSIS, ROUTINE W REFLEX MICROSCOPIC
Bilirubin Urine: NEGATIVE
Glucose, UA: NEGATIVE mg/dL
Ketones, ur: NEGATIVE mg/dL
Nitrite: NEGATIVE
Protein, ur: 100 mg/dL — AB
Specific Gravity, Urine: 1.011 (ref 1.005–1.030)
WBC, UA: >50 WBC/hpf (ref 0–5)
pH: 5 (ref 5.0–8.0)

## 2023-12-02 LAB — BASIC METABOLIC PANEL
Anion gap: 11 (ref 5–15)
BUN: 14 mg/dL (ref 8–23)
CO2: 21 mmol/L — ABNORMAL LOW (ref 22–32)
Calcium: 9 mg/dL (ref 8.9–10.3)
Chloride: 107 mmol/L (ref 98–111)
Creatinine, Ser: 0.89 mg/dL (ref 0.61–1.24)
GFR, Estimated: >60 mL/min (ref >60)
Glucose, Bld: 111 mg/dL — ABNORMAL HIGH (ref 70–99)
Potassium: 3.6 mmol/L (ref 3.5–5.1)
Sodium: 139 mmol/L (ref 135–145)

## 2023-12-02 LAB — CBG MONITORING, ED: Glucose-Capillary: 120 mg/dL — ABNORMAL HIGH (ref 70–99)

## 2023-12-02 LAB — MAGNESIUM: Magnesium: 2.1 mg/dL (ref 1.7–2.4)

## 2023-12-02 LAB — TROPONIN I (HIGH SENSITIVITY): Troponin I (High Sensitivity): 19 ng/L — ABNORMAL HIGH (ref <18)

## 2023-12-02 MED ORDER — HYDRALAZINE HCL 20 MG/ML IJ SOLN
2.0000 mg | Freq: Once | INTRAMUSCULAR | Status: AC
  Administered 2023-12-03: 2 mg via INTRAVENOUS
  Filled 2023-12-02: qty 1

## 2023-12-02 MED ORDER — VERAPAMIL HCL ER 180 MG PO TBCR
360.0000 mg | EXTENDED_RELEASE_TABLET | Freq: Once | ORAL | Status: AC
  Administered 2023-12-02: 360 mg via ORAL
  Filled 2023-12-02: qty 2

## 2023-12-02 MED ORDER — LACTATED RINGERS IV BOLUS
1000.0000 mL | Freq: Once | INTRAVENOUS | Status: AC
  Administered 2023-12-02: 1000 mL via INTRAVENOUS

## 2023-12-02 MED ORDER — METOCLOPRAMIDE HCL 5 MG/ML IJ SOLN
10.0000 mg | Freq: Once | INTRAMUSCULAR | Status: AC
  Administered 2023-12-02: 10 mg via INTRAVENOUS
  Filled 2023-12-02: qty 2

## 2023-12-02 MED ORDER — SODIUM CHLORIDE 0.9 % IV SOLN
2.0000 g | Freq: Once | INTRAVENOUS | Status: AC
  Administered 2023-12-03: 2 g via INTRAVENOUS
  Filled 2023-12-02: qty 20

## 2023-12-02 NOTE — ED Notes (Signed)
Pt systolic 225. MD notified

## 2023-12-02 NOTE — ED Triage Notes (Addendum)
Hand and feet numbness started about a week ago. Says something doesn't feel right. Hasn't been taking BP medication. Hx of diabetes and HTN

## 2023-12-02 NOTE — ED Provider Notes (Signed)
[place] EMERGENCY DEPARTMENT AT [facility] Provider Note   CSN: [id number] Arrival date & time: 12/02/23  1828     History {Add pertinent medical, surgical, social history, OB history to HPI:1} Chief Complaint  Patient presents with   Numbness   Tingling    [name] is a 72 y.o. male.  Pt is a 72 y.o. M with dCHF, HOCM, hx CVA, DM, HTN, and polysubstance abuse (cocaine, tobacco) who is ending today complaining of multiple symptoms.  Patient reports since Monday he has had the sensation of numbness and tingling in his arms and legs bilaterally, generally feeling unwell including being nauseated.  He reports he has not been eating much since Monday because of the nausea but has not had any vomiting.  He denies any abdominal pain, chest pain but has felt a little short of breath.  He reports when walking he feels dizzy but has not had any syncope.  He also reports because of nausea he has not been eating well.  He has not had a fever or new cough.  He reports last using cocaine about 3 weeks ago prior to his last hospitalization which was on 11/11/2023.  At that time he required overnight observation, hydration and implementation of his meds.  He reports he has not taken any of his medication since Monday because he was just not feeling well.  He denies any alcohol use.  The history is provided by the patient and medical records.       Home Medications Prior to Admission medications   Medication Sig Start Date End Date Taking? Authorizing Provider  albuterol (PROVENTIL HFA;VENTOLIN HFA) 108 (90 Base) MCG/ACT inhaler Inhale 1-2 puffs into the lungs every 6 (six) hours as needed for wheezing. 09/04/16   Rolland Porter, MD  aspirin EC 81 MG tablet Take 81 mg by mouth every morning.    [provider]  atorvastatin (LIPITOR) 40 MG tablet Take 1 tablet (40 mg total) by mouth daily. 11/12/23   Danford, Earl Lites, MD  diclofenac Sodium (VOLTAREN) 1 % GEL Apply 2 g  topically 4 (four) times daily. 11/12/23   Danford, Earl Lites, MD  metFORMIN (GLUCOPHAGE) 500 MG tablet Take 1 tablet (500 mg total) by mouth 2 (two) times daily. 11/12/23   Danford, Earl Lites, MD  nicotine (NICODERM CQ - DOSED IN MG/24 HOURS) 21 mg/24hr patch Place 1 patch (21 mg total) onto the skin daily. Patient taking differently: Place 21 mg onto the skin daily as needed (smoking cessation). 02/21/20   Rodolph Bong, MD  omeprazole (PRILOSEC) 20 MG capsule Take 1 capsule (20 mg total) by mouth daily. 12/14/22   Arby Barrette, MD  ondansetron (ZOFRAN-ODT) 8 MG disintegrating tablet Take 1 tablet (8 mg total) by mouth every 8 (eight) hours as needed for nausea or vomiting. 08/19/22   Margarita Grizzle, MD  tamsulosin (FLOMAX) 0.4 MG CAPS capsule Take 1 capsule (0.4 mg total) by mouth daily after supper. 11/12/23   Danford, Earl Lites, MD  traMADol (ULTRAM) 50 MG tablet Take 1 tablet by mouth twice a day as needed for severe pains 06/04/21     verapamil (CALAN-SR) 180 MG CR tablet Take 2 tablets (360 mg total) by mouth daily. 11/12/23   Danford, Earl Lites, MD      Allergies    Patient has no known allergies.    Review of Systems   Review of Systems  Physical Exam Updated Vital Signs BP (!) 228/58  Pulse (!) 54   Temp 98.6 F (37 C) (Oral)   Resp 10   Ht 5\' 9"  (1.753 m)   Wt 72.6 kg   SpO2 100%   BMI 23.63 kg/m  Physical Exam Vitals and nursing note reviewed.  Constitutional:      General: He is not in acute distress.    Appearance: He is well-developed.  HENT:     Head: Normocephalic and atraumatic.     Mouth/Throat:     Mouth: Mucous membranes are dry.  Eyes:     Conjunctiva/sclera: Conjunctivae normal.     Pupils: Pupils are equal, round, and reactive to light.  Cardiovascular:     Rate and Rhythm: Regular rhythm. Bradycardia present.     Heart sounds: No murmur heard. Pulmonary:     Effort: Pulmonary effort is normal. No respiratory distress.      Breath sounds: Normal breath sounds. No wheezing or rales.  Abdominal:     General: There is no distension.     Palpations: Abdomen is soft.     Tenderness: There is no abdominal tenderness. There is no guarding or rebound.  Musculoskeletal:        General: No tenderness. Normal range of motion.     Cervical back: Normal range of motion and neck supple.     Right lower leg: No edema.     Left lower leg: No edema.  Skin:    General: Skin is warm and dry.     Findings: No erythema or rash.  Neurological:     Mental Status: He is alert and oriented to person, place, and time.     Sensory: Sensation is intact.     Motor: Motor function is intact.     Comments: Patient is able to lift bilateral upper extremities without any pronator drift.  5 out of 5 strength in bilateral upper extremities.  Lower extremities with 4 out of 5 strength.  He is able to complete heel-to-shin testing with both legs with only minimal difficulty.  Normal finger-to-nose.  Speech is clear without aphasia.  Psychiatric:        Behavior: Behavior normal.     ED Results / Procedures / Treatments   Labs (all labs ordered are listed, but only abnormal results are displayed) Labs Reviewed  CBC WITH DIFFERENTIAL/PLATELET - Abnormal; Notable for the following components:      Result Value   WBC 11.2 (*)    MCV 74.9 (*)    MCH 23.7 (*)    All other components within normal limits  URINALYSIS, ROUTINE W REFLEX MICROSCOPIC - Abnormal; Notable for the following components:   APPearance CLOUDY (*)    Hgb urine dipstick SMALL (*)    Protein, ur 100 (*)    Leukocytes,Ua LARGE (*)    Bacteria, UA MANY (*)    All other components within normal limits  CBG MONITORING, ED - Abnormal; Notable for the following components:   Glucose-Capillary 120 (*)    All other components within normal limits  MAGNESIUM  BASIC METABOLIC PANEL  HEPATIC FUNCTION PANEL  TROPONIN I (HIGH SENSITIVITY)    EKG EKG  Interpretation Date/Time:  Wednesday December 02 2023 20:36:50 EST Ventricular Rate:  53 PR Interval:  178 QRS Duration:  98 QT Interval:  527 QTC Calculation: 495 R Axis:   -4  Text Interpretation: Sinus rhythm Probable left atrial enlargement Anteroseptal infarct, old Abnrm T, consider ischemia, anterolateral lds No significant change since last tracing Confirmed by Anitra Lauth,  Arhan Mcmanamon ([id number]) on 12/02/2023 8:55:19 PM  Radiology No results found.  Procedures Procedures  {Document cardiac monitor, telemetry assessment procedure when appropriate:1}  Medications Ordered in ED Medications  lactated ringers bolus 1,000 mL (1,000 mLs Intravenous New Bag/Given 12/02/23 2017)  metoCLOPramide (REGLAN) injection 10 mg (10 mg Intravenous Given 12/02/23 2017)    ED Course/ Medical Decision Making/ A&P   {   Click here for ABCD2, HEART and other calculatorsREFRESH Note before signing :1}                              Medical Decision Making Amount and/or Complexity of Data Reviewed Labs: ordered. Radiology: ordered.  Risk Prescription drug management.   Pt with multiple medical problems and comorbidities and presenting today with a complaint that caries a high risk for morbidity and mortality.  Here today with multiple complaints.  This presentation seems very similar to when he was admitted 2 weeks ago.  At that time he was found to have hypertensive urgency and dehydration.  Patient has not taken his meds since Monday and is complained of nausea but is not having frank infectious symptoms.  His symptoms are bilateral and low suspicion for stroke at this time.  Concern for electrolyte abnormality, dehydration, hypertensive urgency, atypical ACS.  Lower suspicion for pneumonia or acute abdominal process.  Patient denies any recent drug use since discharge from the hospital. Patient given his home dose of verapamil and IV fluids.  He was also given antiemetics.  Labs and imaging are pending.  I  independently interpreted patient's EKG which shows no acute changes from prior.   {Document critical care time when appropriate:1} {Document review of labs and clinical decision tools ie heart score, Chads2Vasc2 etc:1}  {Document your independent review of radiology images, and any outside records:1} {Document your discussion with family members, caretakers, and with consultants:1} {Document social determinants of health affecting pt's care:1} {Document your decision making why or why not admission, treatments were needed:1} Final Clinical Impression(s) / ED Diagnoses Final diagnoses:  None    Rx / DC Orders ED Discharge Orders     None

## 2023-12-02 NOTE — ED Notes (Signed)
RN has made MD Plunkett aware of pt BP in 200's, states will order IV meds, RN awaiting orders

## 2023-12-02 NOTE — ED Notes (Signed)
RN made aware of vitals  

## 2023-12-03 DIAGNOSIS — N400 Enlarged prostate without lower urinary tract symptoms: Secondary | ICD-10-CM | POA: Diagnosis present

## 2023-12-03 DIAGNOSIS — Z8673 Personal history of transient ischemic attack (TIA), and cerebral infarction without residual deficits: Secondary | ICD-10-CM | POA: Diagnosis not present

## 2023-12-03 DIAGNOSIS — E782 Mixed hyperlipidemia: Secondary | ICD-10-CM

## 2023-12-03 DIAGNOSIS — Z79899 Other long term (current) drug therapy: Secondary | ICD-10-CM | POA: Diagnosis not present

## 2023-12-03 DIAGNOSIS — N3080 Other cystitis without hematuria: Secondary | ICD-10-CM | POA: Diagnosis present

## 2023-12-03 DIAGNOSIS — E119 Type 2 diabetes mellitus without complications: Secondary | ICD-10-CM | POA: Diagnosis not present

## 2023-12-03 DIAGNOSIS — R3914 Feeling of incomplete bladder emptying: Secondary | ICD-10-CM

## 2023-12-03 DIAGNOSIS — F1910 Other psychoactive substance abuse, uncomplicated: Secondary | ICD-10-CM

## 2023-12-03 DIAGNOSIS — E1169 Type 2 diabetes mellitus with other specified complication: Secondary | ICD-10-CM | POA: Diagnosis present

## 2023-12-03 DIAGNOSIS — I160 Hypertensive urgency: Secondary | ICD-10-CM | POA: Diagnosis present

## 2023-12-03 DIAGNOSIS — Z7984 Long term (current) use of oral hypoglycemic drugs: Secondary | ICD-10-CM | POA: Diagnosis not present

## 2023-12-03 DIAGNOSIS — M549 Dorsalgia, unspecified: Secondary | ICD-10-CM | POA: Diagnosis present

## 2023-12-03 DIAGNOSIS — N3000 Acute cystitis without hematuria: Secondary | ICD-10-CM | POA: Diagnosis not present

## 2023-12-03 DIAGNOSIS — I110 Hypertensive heart disease with heart failure: Secondary | ICD-10-CM | POA: Diagnosis present

## 2023-12-03 DIAGNOSIS — I421 Obstructive hypertrophic cardiomyopathy: Secondary | ICD-10-CM | POA: Diagnosis present

## 2023-12-03 DIAGNOSIS — R54 Age-related physical debility: Secondary | ICD-10-CM | POA: Diagnosis present

## 2023-12-03 DIAGNOSIS — N401 Enlarged prostate with lower urinary tract symptoms: Secondary | ICD-10-CM

## 2023-12-03 DIAGNOSIS — R200 Anesthesia of skin: Secondary | ICD-10-CM | POA: Diagnosis present

## 2023-12-03 DIAGNOSIS — Z7982 Long term (current) use of aspirin: Secondary | ICD-10-CM | POA: Diagnosis not present

## 2023-12-03 DIAGNOSIS — G8929 Other chronic pain: Secondary | ICD-10-CM | POA: Diagnosis present

## 2023-12-03 DIAGNOSIS — F1110 Opioid abuse, uncomplicated: Secondary | ICD-10-CM | POA: Diagnosis present

## 2023-12-03 DIAGNOSIS — R519 Headache, unspecified: Secondary | ICD-10-CM | POA: Diagnosis present

## 2023-12-03 DIAGNOSIS — I5032 Chronic diastolic (congestive) heart failure: Secondary | ICD-10-CM | POA: Diagnosis present

## 2023-12-03 DIAGNOSIS — Z8249 Family history of ischemic heart disease and other diseases of the circulatory system: Secondary | ICD-10-CM | POA: Diagnosis not present

## 2023-12-03 DIAGNOSIS — Z833 Family history of diabetes mellitus: Secondary | ICD-10-CM | POA: Diagnosis not present

## 2023-12-03 DIAGNOSIS — R001 Bradycardia, unspecified: Secondary | ICD-10-CM | POA: Diagnosis present

## 2023-12-03 DIAGNOSIS — F17210 Nicotine dependence, cigarettes, uncomplicated: Secondary | ICD-10-CM | POA: Diagnosis present

## 2023-12-03 DIAGNOSIS — F1410 Cocaine abuse, uncomplicated: Secondary | ICD-10-CM | POA: Diagnosis present

## 2023-12-03 LAB — BASIC METABOLIC PANEL
Anion gap: 10 (ref 5–15)
BUN: 17 mg/dL (ref 8–23)
CO2: 23 mmol/L (ref 22–32)
Calcium: 9.2 mg/dL (ref 8.9–10.3)
Chloride: 110 mmol/L (ref 98–111)
Creatinine, Ser: 0.89 mg/dL (ref 0.61–1.24)
GFR, Estimated: >60 mL/min (ref >60)
Glucose, Bld: 88 mg/dL (ref 70–99)
Potassium: 3.9 mmol/L (ref 3.5–5.1)
Sodium: 143 mmol/L (ref 135–145)

## 2023-12-03 LAB — RAPID URINE DRUG SCREEN, HOSP PERFORMED
Amphetamines: NOT DETECTED
Barbiturates: NOT DETECTED
Benzodiazepines: NOT DETECTED
Cocaine: NOT DETECTED
Opiates: POSITIVE — AB
Tetrahydrocannabinol: NOT DETECTED

## 2023-12-03 LAB — CBC
HCT: 42.1 % (ref 39.0–52.0)
Hemoglobin: 12.7 g/dL — ABNORMAL LOW (ref 13.0–17.0)
MCH: 23.1 pg — ABNORMAL LOW (ref 26.0–34.0)
MCHC: 30.2 g/dL (ref 30.0–36.0)
MCV: 76.5 fL — ABNORMAL LOW (ref 80.0–100.0)
Platelets: 260 10*3/uL (ref 150–400)
RBC: 5.5 MIL/uL (ref 4.22–5.81)
RDW: 14.9 % (ref 11.5–15.5)
WBC: 9.5 10*3/uL (ref 4.0–10.5)
nRBC: 0 % (ref 0.0–0.2)

## 2023-12-03 LAB — TROPONIN I (HIGH SENSITIVITY): Troponin I (High Sensitivity): 20 ng/L — ABNORMAL HIGH (ref <18)

## 2023-12-03 MED ORDER — NICOTINE 21 MG/24HR TD PT24
21.0000 mg | MEDICATED_PATCH | Freq: Every day | TRANSDERMAL | Status: DC | PRN
Start: 2023-12-03 — End: 2023-12-04

## 2023-12-03 MED ORDER — TAMSULOSIN HCL 0.4 MG PO CAPS: 0.4000 mg | ORAL_CAPSULE | Freq: Every day | ORAL | Status: DC

## 2023-12-03 MED ORDER — ACETAMINOPHEN 650 MG RE SUPP: 650.0000 mg | Freq: Four times a day (QID) | RECTAL | Status: DC | PRN

## 2023-12-03 MED ORDER — ATORVASTATIN CALCIUM 40 MG PO TABS
40.0000 mg | ORAL_TABLET | Freq: Every day | ORAL | Status: DC
  Administered 2023-12-03 – 2023-12-04 (×2): 40 mg via ORAL
  Filled 2023-12-03 (×2): qty 1

## 2023-12-03 MED ORDER — LIDOCAINE 5 % EX PTCH
2.0000 | MEDICATED_PATCH | CUTANEOUS | Status: DC
Start: 2023-12-03 — End: 2023-12-04
  Administered 2023-12-03: 2 via TRANSDERMAL
  Filled 2023-12-03: qty 2

## 2023-12-03 MED ORDER — ENOXAPARIN SODIUM 40 MG/0.4ML IJ SOSY
40.0000 mg | PREFILLED_SYRINGE | INTRAMUSCULAR | Status: DC
  Administered 2023-12-03: 40 mg via SUBCUTANEOUS
  Filled 2023-12-03: qty 0.4

## 2023-12-03 MED ORDER — ACETAMINOPHEN 325 MG PO TABS
650.0000 mg | ORAL_TABLET | Freq: Four times a day (QID) | ORAL | Status: DC | PRN
  Administered 2023-12-03: 650 mg via ORAL
  Filled 2023-12-03: qty 2

## 2023-12-03 MED ORDER — SODIUM CHLORIDE 0.9 % IV SOLN
1.0000 g | INTRAVENOUS | Status: DC
  Administered 2023-12-04: 1 g via INTRAVENOUS
  Filled 2023-12-03: qty 10

## 2023-12-03 MED ORDER — TAMSULOSIN HCL 0.4 MG PO CAPS
0.4000 mg | ORAL_CAPSULE | Freq: Every day | ORAL | Status: DC
  Administered 2023-12-03 – 2023-12-04 (×2): 0.4 mg via ORAL
  Filled 2023-12-03 (×2): qty 1

## 2023-12-03 MED ORDER — HYDRALAZINE HCL 50 MG PO TABS
50.0000 mg | ORAL_TABLET | Freq: Four times a day (QID) | ORAL | Status: DC
  Administered 2023-12-03 – 2023-12-04 (×3): 50 mg via ORAL
  Filled 2023-12-03 (×3): qty 1

## 2023-12-03 MED ORDER — HYDRALAZINE HCL 20 MG/ML IJ SOLN
10.0000 mg | INTRAMUSCULAR | Status: DC | PRN
  Administered 2023-12-03: 10 mg via INTRAVENOUS
  Filled 2023-12-03: qty 1

## 2023-12-03 MED ORDER — HYDROCODONE-ACETAMINOPHEN 5-325 MG PO TABS
1.0000 | ORAL_TABLET | Freq: Once | ORAL | Status: AC
  Administered 2023-12-03: 1 via ORAL
  Filled 2023-12-03: qty 1

## 2023-12-03 MED ORDER — METFORMIN HCL 500 MG PO TABS
500.0000 mg | ORAL_TABLET | Freq: Two times a day (BID) | ORAL | Status: DC
  Administered 2023-12-03 – 2023-12-04 (×3): 500 mg via ORAL
  Filled 2023-12-03 (×3): qty 1

## 2023-12-03 MED ORDER — KETOROLAC TROMETHAMINE 15 MG/ML IJ SOLN
15.0000 mg | Freq: Once | INTRAMUSCULAR | Status: AC
  Administered 2023-12-03: 15 mg via INTRAVENOUS
  Filled 2023-12-03: qty 1

## 2023-12-03 MED ORDER — VERAPAMIL HCL ER 180 MG PO TBCR
360.0000 mg | EXTENDED_RELEASE_TABLET | Freq: Every day | ORAL | Status: DC
  Administered 2023-12-03: 360 mg via ORAL
  Filled 2023-12-03: qty 2

## 2023-12-03 NOTE — Assessment & Plan Note (Signed)
Cont Metformin ?

## 2023-12-03 NOTE — Assessment & Plan Note (Signed)
Continue flomax  

## 2023-12-03 NOTE — Plan of Care (Signed)

## 2023-12-03 NOTE — Progress Notes (Signed)
OT Cancellation Note  Patient Details Name: [name] MRN: [id number] DOB: 11-26-1951   Cancelled Treatment:    Reason Eval/Treat Not Completed: OT screened, no needs identified, will sign off  Evern Bio 12/03/2023, 11:04 AM Berna Spare, OTR/L Acute Rehabilitation Services Office: [phone]

## 2023-12-03 NOTE — Assessment & Plan Note (Addendum)
Generalized weakness and bilateral numbness and tingling today similar to HTN urgency presentation last month. DDx for cause of generalized weakness includes UTI. Todays HTN likely due to not having verapamil for past several days at home. UDS today neg for cocaine unlike a couple of weeks ago Resume home verapamil Hydralazine IV PRN if needed still (BP now down to 180 already after taking home verapamil). Check MRI brain if symptoms persist, though no focal deficit on exam PT/OT

## 2023-12-03 NOTE — Assessment & Plan Note (Addendum)
Opiates in todays UDS. No cocaine today.

## 2023-12-03 NOTE — H&P (Addendum)
History and Physical    Patient: [name] NWG:[id number] DOB: Mar 09, 1951 DOA: 12/02/2023 DOS: the patient was seen and examined on 12/03/2023 PCP: Fleet Contras, MD  Patient coming from: Home  Chief Complaint:  Chief Complaint  Patient presents with   Numbness   Tingling   HPI: [name] is a 72 y.o. male with medical history significant of HFpEF, HOCM, stroke, DM, HTN, cocaine abuse, narcotic abuse.  Pt in to ED today with c/o B arm and leg numbness and tingling ongoing since Monday.  Also having nausea.  No vomiting.  No abd pain nor CP.  Has dizziness with walking but no syncope.  No fever, no new cough.  Last cocaine use ~3 weeks ago just prior to last hospitalization (11/11/2023).  Pt hospitalized overnight at that time for similar symptoms which were felt to be due to hypertensive urgency at that time.  Hasn't taken any of his meds including BP meds since Monday.   Review of Systems: As mentioned in the history of present illness. All other systems reviewed and are negative. Past Medical History:  Diagnosis Date   Alcohol dependence (HCC) 12/10/2013   Angioedema of lips 12/24/2013   Brain tumor (HCC)    CHF (congestive heart failure) (HCC)    diastolic   Chronic back pain    Chronic headache    CVA (cerebral vascular accident) (HCC)    Diabetes mellitus    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HCC) 02/18/2020   HTN (hypertension), malignant 09/06/2012   Hypertension    Narcotic abuse (HCC)    Normal cardiac stress test 2009   TIA (transient ischemic attack) 09/06/2012   Tobacco use disorder 12/10/2013   UTI (urinary tract infection)    Vertigo 09/06/2012   Past Surgical History:  Procedure Laterality Date   NO PAST SURGERIES     Social History:  reports that he has been smoking cigarettes. He uses smokeless tobacco. He reports that he does not currently use alcohol. He reports that he does not currently use drugs after having used the  following drugs: Marijuana, Cocaine, and IV.  No Known Allergies  Family History  Problem Relation Age of Onset   CAD Father        "heart attack in his 72's"   Hypertension Father    Diabetes Brother    Diabetes Sister    Hypertension Mother     Prior to Admission medications   Medication Sig Start Date End Date Taking? Authorizing Provider  albuterol (PROVENTIL HFA;VENTOLIN HFA) 108 (90 Base) MCG/ACT inhaler Inhale 1-2 puffs into the lungs every 6 (six) hours as needed for wheezing. 09/04/16   Rolland Porter, MD  aspirin EC 81 MG tablet Take 81 mg by mouth every morning.    [provider]  atorvastatin (LIPITOR) 40 MG tablet Take 1 tablet (40 mg total) by mouth daily. 11/12/23   Danford, Earl Lites, MD  diclofenac Sodium (VOLTAREN) 1 % GEL Apply 2 g topically 4 (four) times daily. 11/12/23   Danford, Earl Lites, MD  metFORMIN (GLUCOPHAGE) 500 MG tablet Take 1 tablet (500 mg total) by mouth 2 (two) times daily. 11/12/23   Danford, Earl Lites, MD  nicotine (NICODERM CQ - DOSED IN MG/24 HOURS) 21 mg/24hr patch Place 1 patch (21 mg total) onto the skin daily. Patient taking differently: Place 21 mg onto the skin daily as needed (smoking cessation). 02/21/20   Rodolph Bong, MD  omeprazole (PRILOSEC) 20 MG capsule Take 1 capsule (20 mg  total) by mouth daily. 12/14/22   Arby Barrette, MD  ondansetron (ZOFRAN-ODT) 8 MG disintegrating tablet Take 1 tablet (8 mg total) by mouth every 8 (eight) hours as needed for nausea or vomiting. 08/19/22   Margarita Grizzle, MD  tamsulosin (FLOMAX) 0.4 MG CAPS capsule Take 1 capsule (0.4 mg total) by mouth daily after supper. 11/12/23   Danford, Earl Lites, MD  traMADol (ULTRAM) 50 MG tablet Take 1 tablet by mouth twice a day as needed for severe pains 06/04/21     verapamil (CALAN-SR) 180 MG CR tablet Take 2 tablets (360 mg total) by mouth daily. 11/12/23   Alberteen Sam, MD    Physical Exam: Vitals:   12/02/23 2345 12/03/23  0000 12/03/23 0045 12/03/23 0223  BP: (!) 216/59 (!) 216/58 (!) 180/47   Pulse: (!) 54 (!) 55 (!) 57   Resp: 20  10   Temp:    98.9 F (37.2 C)  TempSrc:    Oral  SpO2: 97% 98% 97%   Weight:      Height:       Constitutional: NAD, calm, comfortable Respiratory: clear to auscultation bilaterally, no wheezing, no crackles. Normal respiratory effort. No accessory muscle use.  Cardiovascular: Regular rate and rhythm, no murmurs / rubs / gallops. No extremity edema. 2+ pedal pulses. No carotid bruits.  Abdomen: no tenderness, no masses palpated. No hepatosplenomegaly. Bowel sounds positive.  Neurologic: CN 2-12 grossly intact. Sensation intact, DTR normal. Strength 5/5 in all 4.  Psychiatric: Normal judgment and insight. Alert and oriented x 3. Normal mood.   Data Reviewed:    Labs on Admission: I have personally reviewed following labs and imaging studies  CBC: Recent Labs  Lab 12/02/23 1950  WBC 11.2*  NEUTROABS 6.6  HGB 13.1  HCT 41.4  MCV 74.9*  PLT 302   Basic Metabolic Panel: Recent Labs  Lab 12/02/23 2001 12/02/23 2042  NA  --  139  K  --  3.6  CL  --  107  CO2  --  21*  GLUCOSE  --  111*  BUN  --  14  CREATININE  --  0.89  CALCIUM  --  9.0  MG 2.1  --    GFR: Estimated Creatinine Clearance: 76.1 mL/min (by C-G formula based on SCr of 0.89 mg/dL). Liver Function Tests: Recent Labs  Lab 12/02/23 2042  AST 23  ALT 19  ALKPHOS 59  BILITOT 0.4  PROT 6.9  ALBUMIN 3.5   No results for input(s): "LIPASE", "AMYLASE" in the last 168 hours. No results for input(s): "AMMONIA" in the last 168 hours. Coagulation Profile: No results for input(s): "INR", "PROTIME" in the last 168 hours. Cardiac Enzymes: No results for input(s): "CKTOTAL", "CKMB", "CKMBINDEX", "TROPONINI" in the last 168 hours. BNP (last 3 results) No results for input(s): "PROBNP" in the last 8760 hours. HbA1C: No results for input(s): "HGBA1C" in the last 72 hours. CBG: Recent Labs  Lab  12/02/23 1947  GLUCAP 120*   Lipid Profile: No results for input(s): "CHOL", "HDL", "LDLCALC", "TRIG", "CHOLHDL", "LDLDIRECT" in the last 72 hours. Thyroid Function Tests: No results for input(s): "TSH", "T4TOTAL", "FREET4", "T3FREE", "THYROIDAB" in the last 72 hours. Anemia Panel: No results for input(s): "VITAMINB12", "FOLATE", "FERRITIN", "TIBC", "IRON", "RETICCTPCT" in the last 72 hours. Urine analysis:    Component Value Date/Time   COLORURINE YELLOW 12/02/2023 1905   APPEARANCEUR CLOUDY (A) 12/02/2023 1905   LABSPEC 1.011 12/02/2023 1905   PHURINE 5.0 12/02/2023 1905  GLUCOSEU NEGATIVE 12/02/2023 1905   HGBUR SMALL (A) 12/02/2023 1905   BILIRUBINUR NEGATIVE 12/02/2023 1905   KETONESUR NEGATIVE 12/02/2023 1905   PROTEINUR 100 (A) 12/02/2023 1905   UROBILINOGEN 2.0 (H) 08/06/2015 1209   NITRITE NEGATIVE 12/02/2023 1905   LEUKOCYTESUR LARGE (A) 12/02/2023 1905    Radiological Exams on Admission: CT Renal Stone Study  Result Date: 12/02/2023 CLINICAL DATA:  Recurrent UTI and nausea. EXAM: CT ABDOMEN AND PELVIS WITHOUT CONTRAST TECHNIQUE: Multidetector CT imaging of the abdomen and pelvis was performed following the standard protocol without IV contrast. RADIATION DOSE REDUCTION: This exam was performed according to the departmental dose-optimization program which includes automated exposure control, adjustment of the mA and/or kV according to patient size and/or use of iterative reconstruction technique. COMPARISON:  10/24/2023 FINDINGS: Lower chest: The lung bases are clear of acute process. No pleural effusion or pulmonary lesions. The heart is normal in size. No pericardial effusion. The distal esophagus and aorta are unremarkable. Hepatobiliary: No hepatic lesions or intrahepatic biliary dilatation. The gallbladder is normal. No common bile duct dilatation. Pancreas: Unremarkable. No pancreatic ductal dilatation or surrounding inflammatory changes. Spleen: Normal in size without  focal abnormality. Adrenals/Urinary Tract: The bladder is distended measuring approximately 22 x 11 x 9 cm with estimated volume of 1 L. This does include a large bladder diverticulum at the dome of the bladder. The bladder contains a large amount of gas. It is possible this is related to a recent catheterization or instrumentation but could not exclude cystitis. No definite CT findings to suggest a colovesical fistula. Slight bladder wall thickening and a few small dots of gas in the posterior bladder wall. No bladder calculi or bladder mass. Recommend correlation with urinalysis. Stomach/Bowel: The stomach, duodenum, small bowel and colon are grossly normal without oral contrast. No inflammatory changes, mass lesions or obstructive findings. The appendix is normal. Diffuse colonic diverticulosis without findings for acute diverticulitis. Vascular/Lymphatic: Advanced atherosclerotic calcification involving the aorta, iliac arteries and branch vessels. No abdominal or pelvic lymphadenopathy. Reproductive: The prostate gland and seminal vesicles are unremarkable. Other: No pelvic mass or adenopathy. No free pelvic fluid collections. No inguinal mass or adenopathy. No abdominal wall hernia or subcutaneous lesions. Musculoskeletal: No significant bony findings. IMPRESSION: 1. Distended bladder measuring approximately 22 x 11 x 9 cm with estimated volume of 1 L. This does include a large bladder diverticulum at the dome of the bladder. The bladder contains a large amount of gas. It is possible this is related to a recent catheterization or instrumentation but could not exclude emphysematous cystitis. No definite CT findings to suggest a colovesical fistula. Recommend correlation with urinalysis. 2. No bowel obstruction or inflammatory changes. Normal appendix. 3. Diffuse colonic diverticulosis without findings for acute diverticulitis. 4. Advanced atherosclerotic calcification involving the aorta, iliac arteries and  branch vessels. 5. Aortic atherosclerosis. Aortic Atherosclerosis (ICD10-I70.0). Electronically Signed   By: Rudie Meyer M.D.   On: 12/02/2023 23:06   CT Head Wo Contrast  Result Date: 12/02/2023 CLINICAL DATA:  Neurological deficit. EXAM: CT HEAD WITHOUT CONTRAST TECHNIQUE: Contiguous axial images were obtained from the base of the skull through the vertex without intravenous contrast. RADIATION DOSE REDUCTION: This exam was performed according to the departmental dose-optimization program which includes automated exposure control, adjustment of the mA and/or kV according to patient size and/or use of iterative reconstruction technique. COMPARISON:  November 11, 2023 FINDINGS: Brain: There is mild cerebral atrophy with widening of the extra-axial spaces and ventricular dilatation. There are areas  of decreased attenuation within the white matter tracts of the supratentorial brain, consistent with microvascular disease changes. A small chronic left frontal lobe infarct is noted. Vascular: No hyperdense vessel or unexpected calcification. Skull: Normal. Negative for fracture or focal lesion. Sinuses/Orbits: No acute finding. Other: None. IMPRESSION: 1. Generalized cerebral atrophy with chronic white matter small vessel ischemic changes. 2. No acute intracranial abnormality. Electronically Signed   By: Aram Candela M.D.   On: 12/02/2023 21:26   DG Chest Port 1 View  Result Date: 12/02/2023 CLINICAL DATA:  Shortness of breath EXAM: PORTABLE CHEST 1 VIEW COMPARISON:  X-ray 03/01/2023 and older FINDINGS: Enlarged cardiopericardial silhouette. No pneumothorax or effusion. No edema. Overlapping cardiac leads. Film is rotated to the right. Hyperinflation. Slightly elevated right hemidiaphragm. IMPRESSION: Hyperinflation.  Slight elevation of the right hemidiaphragm. Enlarged heart Electronically Signed   By: Karen Kays M.D.   On: 12/02/2023 20:57    EKG: Independently reviewed.   Assessment and Plan: *  Hypertensive urgency Generalized weakness and bilateral numbness and tingling today similar to HTN urgency presentation last month. DDx for cause of generalized weakness includes UTI. Todays HTN likely due to not having verapamil for past several days at home. UDS today neg for cocaine unlike a couple of weeks ago Resume home verapamil Hydralazine IV PRN if needed still (BP now down to 180 already after taking home verapamil). 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 Pt urinated on his own following CT scan showing ~1L in bladder with 239 residual on bladder scan. UCx pending Rocephin empirically  Polysubstance abuse (HCC) Opiates in todays UDS. No cocaine today.  Mixed hyperlipidemia due to type 2 diabetes mellitus (HCC) Continue statin  BPH (benign prostatic hyperplasia) Continue flomax  DM2 (diabetes mellitus, type 2) (HCC) Cont Metformin      Advance Care Planning:   Code Status: Full Code  Consults: None  Family Communication: No family in room  Severity of Illness: The appropriate patient status for this patient is OBSERVATION. Observation status is judged to be reasonable and necessary in order to provide the required intensity of service to ensure the patient's safety. The patient's presenting symptoms, physical exam findings, and initial radiographic and laboratory data in the context of their medical condition is felt to place them at decreased risk for further clinical deterioration. Furthermore, it is anticipated that the patient will be medically stable for discharge from the hospital within 2 midnights of admission.   Author: Hillary Bow., DO 12/03/2023 2:57 AM  For on call review [email or web address].

## 2023-12-03 NOTE — Progress Notes (Signed)
   12/03/23 0925  PT Visit Information  Last PT Received On 12/03/23  Reason Eval/Treat Not Completed PT screened, no needs identified, will sign off (Pt reports no skilled needs and that he is at baseline mobility level and has been independent around room. Discussed notifying care team if mobility status changes. PT will sign off.)

## 2023-12-03 NOTE — Progress Notes (Signed)
[facility] PROGRESS NOTE  [name] (DOB: [birth year]) ZOX:[id number] PCP: Fleet Contras, MD  Brief Narrative: [name] is a 72 y.o. male with a history of HFpEF, HOCM, CVA, T2DM, HTN, cocaine use, narcotic abuse who presented to the ED on 12/02/2023 with extremity numbness/tingling for 2 days in the setting of not taking BP medications. He was severely hypertensive with evidence of emphysematous cystitis. Admitted this morning and given oral BP medications and ceftriaxone pending urine culture.  Subjective: Feels a bit better, still  chills with Tmax 99.22F. No further numbness or tingling. No chest pain or dyspnea.    Objective: BP (!) 178/49 (BP Location: Left Arm)   Pulse (!) 57   Temp 98.8 F (37.1 C) (Oral)   Resp 18   Ht 5\' 9"  (1.753 m)   Wt 72.6 kg   SpO2 100%   BMI 23.63 kg/m   Gen: Frail male in no distress Pulm: Clear, nonlabored  CV: RRR, borderline bradycardia, no MRG or pitting edema.  GI: Soft, NT, ND, +BS  Neuro: Alert and oriented. Sensation and motor function intact in all extremities. No new focal deficits.   Assessment & Plan: Continue BP medications as order and ceftriaxone pending urine culture results. Otherwise, per H&P this AM by Dr. Julian Reil.  Tyrone Nine, MD [facility] [email or web address] 12/03/2023, 12:59 PM

## 2023-12-03 NOTE — Assessment & Plan Note (Addendum)
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 Pt urinated on his own following CT scan showing ~1L in bladder with 239 residual on bladder scan. UCx pending Rocephin empirically

## 2023-12-03 NOTE — Assessment & Plan Note (Signed)
Continue statin. 

## 2023-12-03 NOTE — Progress Notes (Signed)
   12/03/23 1843  Assess: MEWS Score  BP (!) 202/56  MAP (mmHg) 97  Pulse Rate 83  Resp 18  SpO2 100 %  Assess: MEWS Score  MEWS Temp 0  MEWS Systolic 2  MEWS Pulse 0  MEWS RR 0  MEWS LOC 0  MEWS Score 2  MEWS Score Color Yellow  Assess: if the MEWS score is Yellow or Red  Were vital signs accurate and taken at a resting state? Yes  Does the patient meet 2 or more of the SIRS criteria? No  MEWS guidelines implemented  Yes, yellow  Treat  MEWS Interventions Considered administering scheduled or prn medications/treatments as ordered  Take Vital Signs  Increase Vital Sign Frequency  Yellow: Q2hr x1, continue Q4hrs until patient remains green for 12hrs  Escalate  MEWS: Escalate Yellow: Discuss with charge nurse and consider notifying provider and/or RRT  Notify: Charge Nurse/RN  Name of Charge Nurse/RN Notified Renita, Charity fundraiser.  Provider Notification  Provider Name/Title Dr. Jarvis Newcomer  Date Provider Notified 12/03/23  Time Provider Notified 1858  Method of Notification Page (Secure chat)  Notification Reason Other (Comment)  Provider response See new orders  Date of Provider Response 12/03/23  Time of Provider Response 1900  Assess: SIRS CRITERIA  SIRS Temperature  0  SIRS Pulse 0  SIRS Respirations  0  SIRS WBC 0  SIRS Score Sum  0

## 2023-12-03 NOTE — ED Notes (Signed)
.ED TO INPATIENT HANDOFF REPORT  Name/Age/Gender [name] 72 y.o. male  Code Status    Code Status Orders  (From admission, onward)           Start     Ordered   12/03/23 0221  Full code  Continuous       Question:  By:  Answer:  Default: patient does not have capacity for decision making, no surrogate or prior directive available   12/03/23 0232           Code Status History     Date Active Date Inactive Code Status Order ID Comments User Context   11/11/2023 2256 11/12/2023 1835 Full Code [id number]  Angie Fava, DO ED   03/01/2023 1236 03/05/2023 1840 Full Code [id number]  Rodolph Bong, MD ED   03/22/2022 0851 03/25/2022 1551 Full Code [id number]  Alba Cory, MD Inpatient   02/16/2020 2235 02/20/2020 2310 Full Code [id number]  Therisa Doyne, MD ED   01/23/2017 0224 01/24/2017 1426 Full Code [id number]  Eduard Clos, MD ED   11/01/2016 0756 11/03/2016 2006 Full Code [id number]  Michael Litter, MD Inpatient   03/18/2015 1042 03/19/2015 1621 Full Code [id number]  Russella Dar, NP ED   01/27/2015 0049 01/30/2015 1451 Full Code [id number]  Lorretta Harp, MD Inpatient   01/12/2015 0854 01/15/2015 1305 Full Code [id number]  Rama, Maryruth Bun, MD ED   11/03/2014 1036 11/04/2014 1730 Full Code [id number]  Elease Etienne, MD Inpatient   08/15/2014 0716 08/17/2014 2028 Full Code [id number]  Eduard Clos, MD Inpatient   12/24/2013 1929 12/27/2013 1931 Full Code [id number]  Vassie Loll, MD Inpatient   12/10/2013 0336 12/10/2013 2224 Full Code [id number]  Ron Parker, MD Inpatient   09/06/2012 1227 09/07/2012 2029 Full Code [id number]  Sande Rives, RN Inpatient       Home/SNF/Other Home  Chief Complaint Hypertensive urgency [I16.0]  Level of Care/Admitting Diagnosis ED Disposition     ED Disposition  Admit   Condition  --   Comment  Hospital Area: [facility] [[id number]]  Level of Care: Med-Surg [16]  May  place patient in observation at [facility] or [facility] if equivalent level of care is available:: No  Covid Evaluation: Asymptomatic - no recent exposure (last 10 days) testing not required  Diagnosis: Hypertensive urgency [106269]  Admitting Physician: Hillary Bow [[id number]]  Attending Physician: Hillary Bow [[id number]]          Medical History Past Medical History:  Diagnosis Date   Alcohol dependence (HCC) 12/10/2013   Angioedema of lips 12/24/2013   Brain tumor (HCC)    CHF (congestive heart failure) (HCC)    diastolic   Chronic back pain    Chronic headache    CVA (cerebral vascular accident) (HCC)    Diabetes mellitus    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HCC) 02/18/2020   HTN (hypertension), malignant 09/06/2012   Hypertension    Narcotic abuse (HCC)    Normal cardiac stress test 2009   TIA (transient ischemic attack) 09/06/2012   Tobacco use disorder 12/10/2013   UTI (urinary tract infection)    Vertigo 09/06/2012    Allergies No Known Allergies  IV Location/Drains/Wounds Patient Lines/Drains/Airways Status     Active Line/Drains/Airways     Name Placement date Placement time Site Days   Peripheral IV 12/02/23 20 G 1" Posterior;Right Forearm 12/02/23  2016  Forearm  1  Labs/Imaging Results for orders placed or performed during the hospital encounter of 12/02/23 (from the past 48 hour(s))  Urinalysis, Routine w reflex microscopic -Urine, Clean Catch     Status: Abnormal   Collection Time: 12/02/23  7:05 PM  Result Value Ref Range   Color, Urine YELLOW YELLOW   APPearance CLOUDY (A) CLEAR   Specific Gravity, Urine 1.011 1.005 - 1.030   pH 5.0 5.0 - 8.0   Glucose, UA NEGATIVE NEGATIVE mg/dL   Hgb urine dipstick SMALL (A) NEGATIVE   Bilirubin Urine NEGATIVE NEGATIVE   Ketones, ur NEGATIVE NEGATIVE mg/dL   Protein, ur 621 (A) NEGATIVE mg/dL   Nitrite NEGATIVE NEGATIVE   Leukocytes,Ua LARGE (A) NEGATIVE   RBC / HPF 6-10 0 - 5  RBC/hpf   WBC, UA >50 0 - 5 WBC/hpf   Bacteria, UA MANY (A) NONE SEEN   Squamous Epithelial / HPF 0-5 0 - 5 /HPF   WBC Clumps PRESENT     Comment: Performed at [facility], [address] [place], [place] [address]  Rapid urine drug screen (hospital performed)     Status: Abnormal   Collection Time: 12/02/23  7:05 PM  Result Value Ref Range   Opiates POSITIVE (A) NONE DETECTED   Cocaine NONE DETECTED NONE DETECTED   Benzodiazepines NONE DETECTED NONE DETECTED   Amphetamines NONE DETECTED NONE DETECTED   Tetrahydrocannabinol NONE DETECTED NONE DETECTED   Barbiturates NONE DETECTED NONE DETECTED    Comment: (NOTE) DRUG SCREEN FOR MEDICAL PURPOSES ONLY.  IF CONFIRMATION IS NEEDED FOR ANY PURPOSE, NOTIFY LAB WITHIN 5 DAYS.  LOWEST DETECTABLE LIMITS FOR URINE DRUG SCREEN Drug Class                     Cutoff (ng/mL) Amphetamine and metabolites    1000 Barbiturate and metabolites    200 Benzodiazepine                 200 Opiates and metabolites        300 Cocaine and metabolites        300 THC                            50 Performed at [facility], [address], [place], [place] [address]   CBG monitoring, ED     Status: Abnormal   Collection Time: 12/02/23  7:47 PM  Result Value Ref Range   Glucose-Capillary 120 (H) 70 - 99 mg/dL    Comment: Glucose reference range applies only to samples taken after fasting for at least 8 hours.  CBC WITH DIFFERENTIAL     Status: Abnormal   Collection Time: 12/02/23  7:50 PM  Result Value Ref Range   WBC 11.2 (H) 4.0 - 10.5 K/uL   RBC 5.53 4.22 - 5.81 MIL/uL   Hemoglobin 13.1 13.0 - 17.0 g/dL   HCT 62.9 52.8 - 41.3 %   MCV 74.9 (L) 80.0 - 100.0 fL   MCH 23.7 (L) 26.0 - 34.0 pg   MCHC 31.6 30.0 - 36.0 g/dL   RDW 24.4 01.0 - 27.2 %   Platelets 302 150 - 400 K/uL   nRBC 0.0 0.0 - 0.2 %   Neutrophils Relative % 58 %   Neutro Abs 6.6 1.7 - 7.7 K/uL   Lymphocytes Relative 31 %   Lymphs Abs 3.4 0.7  - 4.0 K/uL   Monocytes Relative 6 %  Monocytes Absolute 0.6 0.1 - 1.0 K/uL   Eosinophils Relative 4 %   Eosinophils Absolute 0.4 0.0 - 0.5 K/uL   Basophils Relative 1 %   Basophils Absolute 0.1 0.0 - 0.1 K/uL   Immature Granulocytes 0 %   Abs Immature Granulocytes 0.03 0.00 - 0.07 K/uL    Comment: Performed at [facility], [address], [place], [place] [address]  Magnesium     Status: None   Collection Time: 12/02/23  8:01 PM  Result Value Ref Range   Magnesium 2.1 1.7 - 2.4 mg/dL    Comment: HEMOLYSIS AT THIS LEVEL MAY AFFECT RESULT Performed at [facility], [address] [place], [place] [address]   Troponin I (High Sensitivity)     Status: Abnormal   Collection Time: 12/02/23  8:01 PM  Result Value Ref Range   Troponin I (High Sensitivity) 19 (H) <18 ng/L    Comment: (NOTE) Elevated high sensitivity troponin I (hsTnI) values and significant  changes across serial measurements may suggest ACS but many other  chronic and acute conditions are known to elevate hsTnI results.  Refer to the "Links" section for chest pain algorithms and additional  guidance. Performed at [facility], [address] [place], [place] [address]   Basic metabolic panel     Status: Abnormal   Collection Time: 12/02/23  8:42 PM  Result Value Ref Range   Sodium 139 135 - 145 mmol/L   Potassium 3.6 3.5 - 5.1 mmol/L   Chloride 107 98 - 111 mmol/L   CO2 21 (L) 22 - 32 mmol/L   Glucose, Bld 111 (H) 70 - 99 mg/dL    Comment: Glucose reference range applies only to samples taken after fasting for at least 8 hours.   BUN 14 8 - 23 mg/dL   Creatinine, Ser 0.63 0.61 - 1.24 mg/dL   Calcium 9.0 8.9 - 01.6 mg/dL   GFR, Estimated >01 >09 mL/min    Comment: (NOTE) Calculated using the CKD-EPI Creatinine Equation (2021)    Anion gap 11 5 - 15    Comment: Performed at [facility], [address], [place], [place] [address]   Hepatic function panel     Status: None   Collection Time: 12/02/23  8:42 PM  Result Value Ref Range   Total Protein 6.9 6.5 - 8.1 g/dL   Albumin 3.5 3.5 - 5.0 g/dL   AST 23 15 - 41 U/L   ALT 19 0 - 44 U/L   Alkaline Phosphatase 59 38 - 126 U/L   Total Bilirubin 0.4 <1.2 mg/dL   Bilirubin, Direct 0.1 0.0 - 0.2 mg/dL   Indirect Bilirubin 0.3 0.3 - 0.9 mg/dL    Comment: Performed at [facility], [address] [place], [place] [address]  Troponin I (High Sensitivity)     Status: Abnormal   Collection Time: 12/02/23 11:04 PM  Result Value Ref Range   Troponin I (High Sensitivity) 20 (H) <18 ng/L    Comment: (NOTE) Elevated high sensitivity troponin I (hsTnI) values and significant  changes across serial measurements may suggest ACS but many other  chronic and acute conditions are known to elevate hsTnI results.  Refer to the "Links" section for chest pain algorithms and additional  guidance. Performed at [facility], [address], [place], [place] [address]    CT Renal Stone Study  Result Date: 12/02/2023 CLINICAL DATA:  Recurrent UTI and nausea. EXAM:  CT ABDOMEN AND PELVIS WITHOUT CONTRAST TECHNIQUE: Multidetector CT imaging of the abdomen and pelvis was performed following the standard protocol without IV contrast. RADIATION DOSE REDUCTION: This exam was performed according to the departmental dose-optimization program which includes automated exposure control, adjustment of the mA and/or kV according to patient size and/or use of iterative reconstruction technique. COMPARISON:  10/24/2023 FINDINGS: Lower chest: The lung bases are clear of acute process. No pleural effusion or pulmonary lesions. The heart is normal in size. No pericardial effusion. The distal esophagus and aorta are unremarkable. Hepatobiliary: No hepatic lesions or intrahepatic biliary dilatation. The gallbladder is normal. No common bile duct dilatation. Pancreas:  Unremarkable. No pancreatic ductal dilatation or surrounding inflammatory changes. Spleen: Normal in size without focal abnormality. Adrenals/Urinary Tract: The bladder is distended measuring approximately 22 x 11 x 9 cm with estimated volume of 1 L. This does include a large bladder diverticulum at the dome of the bladder. The bladder contains a large amount of gas. It is possible this is related to a recent catheterization or instrumentation but could not exclude cystitis. No definite CT findings to suggest a colovesical fistula. Slight bladder wall thickening and a few small dots of gas in the posterior bladder wall. No bladder calculi or bladder mass. Recommend correlation with urinalysis. Stomach/Bowel: The stomach, duodenum, small bowel and colon are grossly normal without oral contrast. No inflammatory changes, mass lesions or obstructive findings. The appendix is normal. Diffuse colonic diverticulosis without findings for acute diverticulitis. Vascular/Lymphatic: Advanced atherosclerotic calcification involving the aorta, iliac arteries and branch vessels. No abdominal or pelvic lymphadenopathy. Reproductive: The prostate gland and seminal vesicles are unremarkable. Other: No pelvic mass or adenopathy. No free pelvic fluid collections. No inguinal mass or adenopathy. No abdominal wall hernia or subcutaneous lesions. Musculoskeletal: No significant bony findings. IMPRESSION: 1. Distended bladder measuring approximately 22 x 11 x 9 cm with estimated volume of 1 L. This does include a large bladder diverticulum at the dome of the bladder. The bladder contains a large amount of gas. It is possible this is related to a recent catheterization or instrumentation but could not exclude emphysematous cystitis. No definite CT findings to suggest a colovesical fistula. Recommend correlation with urinalysis. 2. No bowel obstruction or inflammatory changes. Normal appendix. 3. Diffuse colonic diverticulosis without  findings for acute diverticulitis. 4. Advanced atherosclerotic calcification involving the aorta, iliac arteries and branch vessels. 5. Aortic atherosclerosis. Aortic Atherosclerosis (ICD10-I70.0). Electronically Signed   By: Rudie Meyer M.D.   On: 12/02/2023 23:06   CT Head Wo Contrast  Result Date: 12/02/2023 CLINICAL DATA:  Neurological deficit. EXAM: CT HEAD WITHOUT CONTRAST TECHNIQUE: Contiguous axial images were obtained from the base of the skull through the vertex without intravenous contrast. RADIATION DOSE REDUCTION: This exam was performed according to the departmental dose-optimization program which includes automated exposure control, adjustment of the mA and/or kV according to patient size and/or use of iterative reconstruction technique. COMPARISON:  November 11, 2023 FINDINGS: Brain: There is mild cerebral atrophy with widening of the extra-axial spaces and ventricular dilatation. There are areas of decreased attenuation within the white matter tracts of the supratentorial brain, consistent with microvascular disease changes. A small chronic left frontal lobe infarct is noted. Vascular: No hyperdense vessel or unexpected calcification. Skull: Normal. Negative for fracture or focal lesion. Sinuses/Orbits: No acute finding. Other: None. IMPRESSION: 1. Generalized cerebral atrophy with chronic white matter small vessel ischemic changes. 2. No acute intracranial abnormality. Electronically Signed   By: Aram Candela  M.D.   On: 12/02/2023 21:26   DG Chest Port 1 View  Result Date: 12/02/2023 CLINICAL DATA:  Shortness of breath EXAM: PORTABLE CHEST 1 VIEW COMPARISON:  X-ray 03/01/2023 and older FINDINGS: Enlarged cardiopericardial silhouette. No pneumothorax or effusion. No edema. Overlapping cardiac leads. Film is rotated to the right. Hyperinflation. Slightly elevated right hemidiaphragm. IMPRESSION: Hyperinflation.  Slight elevation of the right hemidiaphragm. Enlarged heart Electronically  Signed   By: Karen Kays M.D.   On: 12/02/2023 20:57    Pending Labs Unresulted Labs (From admission, onward)     Start     Ordered   12/03/23 0500  CBC  Tomorrow morning,   R        12/03/23 0232   12/03/23 0500  Basic metabolic panel  Tomorrow morning,   R        12/03/23 0232   12/02/23 2118  Urine Culture (for pregnant, neutropenic or urologic patients or patients with an indwelling urinary catheter)  (Urine Labs)  Once,   URGENT       Question:  Indication  Answer:  Altered mental status (if no other cause identified)   12/02/23 2117            Vitals/Pain Today's Vitals   12/02/23 2345 12/03/23 0000 12/03/23 0045 12/03/23 0223  BP: (!) 216/59 (!) 216/58 (!) 180/47   Pulse: (!) 54 (!) 55 (!) 57   Resp: 20  10   Temp:    98.9 F (37.2 C)  TempSrc:    Oral  SpO2: 97% 98% 97%   Weight:      Height:      PainSc:        Isolation Precautions No active isolations  Medications Medications  hydrALAZINE (APRESOLINE) injection 10 mg (has no administration in time range)  acetaminophen (TYLENOL) tablet 650 mg (has no administration in time range)    Or  acetaminophen (TYLENOL) suppository 650 mg (has no administration in time range)  enoxaparin (LOVENOX) injection 40 mg (has no administration in time range)  metFORMIN (GLUCOPHAGE) tablet 500 mg (has no administration in time range)  cefTRIAXone (ROCEPHIN) 1 g in sodium chloride 0.9 % 100 mL IVPB (has no administration in time range)  lactated ringers bolus 1,000 mL (0 mLs Intravenous Stopped 12/02/23 2259)  metoCLOPramide (REGLAN) injection 10 mg (10 mg Intravenous Given 12/02/23 2017)  verapamil (CALAN-SR) CR tablet 360 mg (360 mg Oral Given 12/02/23 2126)  hydrALAZINE (APRESOLINE) injection 2 mg (2 mg Intravenous Given 12/03/23 0000)  cefTRIAXone (ROCEPHIN) 2 g in sodium chloride 0.9 % 100 mL IVPB (0 g Intravenous Stopped 12/03/23 0233)    Mobility walks with person assist

## 2023-12-04 DIAGNOSIS — I160 Hypertensive urgency: Secondary | ICD-10-CM | POA: Diagnosis not present

## 2023-12-04 MED ORDER — HYDRALAZINE HCL 50 MG PO TABS: 100.0000 mg | ORAL_TABLET | Freq: Three times a day (TID) | ORAL | Status: DC

## 2023-12-04 MED ORDER — CARVEDILOL 25 MG PO TABS: 25.0000 mg | ORAL_TABLET | Freq: Two times a day (BID) | ORAL | 0 refills | Status: AC

## 2023-12-04 MED ORDER — CEPHALEXIN 500 MG PO CAPS: 500.0000 mg | ORAL_CAPSULE | Freq: Two times a day (BID) | ORAL | 0 refills | Status: DC

## 2023-12-04 MED ORDER — HYDRALAZINE HCL 100 MG PO TABS: 100.0000 mg | ORAL_TABLET | Freq: Three times a day (TID) | ORAL | 0 refills | Status: DC

## 2023-12-04 MED ORDER — CARVEDILOL 25 MG PO TABS
25.0000 mg | ORAL_TABLET | Freq: Two times a day (BID) | ORAL | Status: DC
  Administered 2023-12-04: 25 mg via ORAL
  Filled 2023-12-04: qty 1

## 2023-12-04 NOTE — Progress Notes (Signed)
The patient is stable, a&ox4, ambulatory without assistance. Discharge instructions reviewed, questions or concerns were denied at this time.

## 2023-12-04 NOTE — Discharge Summary (Signed)
Physician Discharge Summary   Patient: [name] MRN: [id number] DOB: [birth year]  Admit date:     12/02/2023  Discharge date: 12/04/23  Discharge Physician: Tyrone Nine   PCP: Fleet Contras, MD   Recommendations at discharge:  Follow up with PCP and/or cardiology in the next 1-2 weeks for BP recheck on reinitiated antihypertensive regimen including verapamil, hydralazine, and coreg.   Discharge Diagnoses: Principal Problem:   Hypertensive urgency Active Problems:   UTI (urinary tract infection)   DM2 (diabetes mellitus, type 2) (HCC)   BPH (benign prostatic hyperplasia)   Mixed hyperlipidemia due to type 2 diabetes mellitus (HCC)   Polysubstance abuse (HCC)   Emphysematous cystitis  Hospital Course: [name] is a 72 y.o. male with a history of HFpEF, HOCM, CVA, T2DM, HTN, cocaine use, narcotic abuse who presented to the ED on 12/02/2023 with extremity numbness/tingling for 2 days in the setting of not taking BP medications. He was severely hypertensive with evidence of emphysematous cystitis. Admitted this morning and given oral BP medications and ceftriaxone pending urine culture. *** - To treat the bladder infection, take keflex 500mg  twice daily for 7 more days - To treat severely high blood pressure, continue taking verapamil as you were, and also take hydralazine three times per day. You should also take coreg twice a day but do NOT take this if you also use cocaine.  - Please follow up with your PCP in the next week for recheck of your blood pressure and ongoing healthcare maintenance.  - Seek medical attention right away if your symptoms return. *** Assessment and Plan: Hypertensive urgency Generalized weakness and bilateral numbness and tingling today similar to HTN urgency presentation last month. DDx for cause of generalized weakness includes UTI. Todays HTN likely due to not having verapamil for past several days at home. UDS today neg for cocaine unlike a  couple of weeks ago Resume home verapamil, add back hydralazine and coreg per cardiology recommendations earlier this year. cocaine negative on UDS.  Symptoms resolved, no further neuroimaging required.   UTI (urinary tract infection) UA c/w UTI No recent bladder instrumentation / cath to explain air in bladder seen on CT, suspect likely emphysematous cystitis. Pt urinated on his own following CT scan showing ~1L in bladder with 239 residual on bladder scan. UCx pending Rocephin empirically   Polysubstance abuse (HCC) Opiates in todays UDS. No cocaine today.   Mixed hyperlipidemia due to type 2 diabetes mellitus (HCC) Continue statin   BPH (benign prostatic hyperplasia) Continue flomax   DM2 (diabetes mellitus, type 2) (HCC) Cont Metformin      {Tip this will not be part of the note when signed Body mass index is 23.63 kg/m. , ,  (Optional):26781}  {(NOTE) Pain control PDMP Statment (Optional):26782} Consultants: *** Procedures performed: ***  Disposition: {Plan; Disposition:26390} Diet recommendation:  {Diet_Plan:26776} DISCHARGE MEDICATION: Allergies as of 12/04/2023   No Known Allergies   Med Rec must be completed prior to using this [facility]***       Discharge Exam: Filed Weights   12/02/23 1847  Weight: 72.6 kg   ***  Condition at discharge: {DC Condition:26389}  The results of significant diagnostics from this hospitalization (including imaging, microbiology, ancillary and laboratory) are listed below for reference.   Imaging Studies: CT Renal Stone Study  Result Date: 12/02/2023 CLINICAL DATA:  Recurrent UTI and nausea. EXAM: CT ABDOMEN AND PELVIS WITHOUT CONTRAST TECHNIQUE: Multidetector CT imaging of the abdomen and pelvis was performed following the  standard protocol without IV contrast. RADIATION DOSE REDUCTION: This exam was performed according to the departmental dose-optimization program which includes automated exposure control, adjustment  of the mA and/or kV according to patient size and/or use of iterative reconstruction technique. COMPARISON:  10/24/2023 FINDINGS: Lower chest: The lung bases are clear of acute process. No pleural effusion or pulmonary lesions. The heart is normal in size. No pericardial effusion. The distal esophagus and aorta are unremarkable. Hepatobiliary: No hepatic lesions or intrahepatic biliary dilatation. The gallbladder is normal. No common bile duct dilatation. Pancreas: Unremarkable. No pancreatic ductal dilatation or surrounding inflammatory changes. Spleen: Normal in size without focal abnormality. Adrenals/Urinary Tract: The bladder is distended measuring approximately 22 x 11 x 9 cm with estimated volume of 1 L. This does include a large bladder diverticulum at the dome of the bladder. The bladder contains a large amount of gas. It is possible this is related to a recent catheterization or instrumentation but could not exclude cystitis. No definite CT findings to suggest a colovesical fistula. Slight bladder wall thickening and a few small dots of gas in the posterior bladder wall. No bladder calculi or bladder mass. Recommend correlation with urinalysis. Stomach/Bowel: The stomach, duodenum, small bowel and colon are grossly normal without oral contrast. No inflammatory changes, mass lesions or obstructive findings. The appendix is normal. Diffuse colonic diverticulosis without findings for acute diverticulitis. Vascular/Lymphatic: Advanced atherosclerotic calcification involving the aorta, iliac arteries and branch vessels. No abdominal or pelvic lymphadenopathy. Reproductive: The prostate gland and seminal vesicles are unremarkable. Other: No pelvic mass or adenopathy. No free pelvic fluid collections. No inguinal mass or adenopathy. No abdominal wall hernia or subcutaneous lesions. Musculoskeletal: No significant bony findings. IMPRESSION: 1. Distended bladder measuring approximately 22 x 11 x 9 cm with estimated  volume of 1 L. This does include a large bladder diverticulum at the dome of the bladder. The bladder contains a large amount of gas. It is possible this is related to a recent catheterization or instrumentation but could not exclude emphysematous cystitis. No definite CT findings to suggest a colovesical fistula. Recommend correlation with urinalysis. 2. No bowel obstruction or inflammatory changes. Normal appendix. 3. Diffuse colonic diverticulosis without findings for acute diverticulitis. 4. Advanced atherosclerotic calcification involving the aorta, iliac arteries and branch vessels. 5. Aortic atherosclerosis. Aortic Atherosclerosis (ICD10-I70.0). Electronically Signed   By: Rudie Meyer M.D.   On: 12/02/2023 23:06   CT Head Wo Contrast  Result Date: 12/02/2023 CLINICAL DATA:  Neurological deficit. EXAM: CT HEAD WITHOUT CONTRAST TECHNIQUE: Contiguous axial images were obtained from the base of the skull through the vertex without intravenous contrast. RADIATION DOSE REDUCTION: This exam was performed according to the departmental dose-optimization program which includes automated exposure control, adjustment of the mA and/or kV according to patient size and/or use of iterative reconstruction technique. COMPARISON:  November 11, 2023 FINDINGS: Brain: There is mild cerebral atrophy with widening of the extra-axial spaces and ventricular dilatation. There are areas of decreased attenuation within the white matter tracts of the supratentorial brain, consistent with microvascular disease changes. A small chronic left frontal lobe infarct is noted. Vascular: No hyperdense vessel or unexpected calcification. Skull: Normal. Negative for fracture or focal lesion. Sinuses/Orbits: No acute finding. Other: None. IMPRESSION: 1. Generalized cerebral atrophy with chronic white matter small vessel ischemic changes. 2. No acute intracranial abnormality. Electronically Signed   By: Aram Candela M.D.   On: 12/02/2023  21:26   DG Chest Port 1 View  Result Date: 12/02/2023 CLINICAL  DATA:  Shortness of breath EXAM: PORTABLE CHEST 1 VIEW COMPARISON:  X-ray 03/01/2023 and older FINDINGS: Enlarged cardiopericardial silhouette. No pneumothorax or effusion. No edema. Overlapping cardiac leads. Film is rotated to the right. Hyperinflation. Slightly elevated right hemidiaphragm. IMPRESSION: Hyperinflation.  Slight elevation of the right hemidiaphragm. Enlarged heart Electronically Signed   By: Karen Kays M.D.   On: 12/02/2023 20:57   DG Hip Unilat With Pelvis 2-3 Views Right  Result Date: 11/11/2023 CLINICAL DATA:  Pain after fall. EXAM: DG HIP (WITH OR WITHOUT PELVIS) 2-3V RIGHT COMPARISON:  Hip radiograph 04/01/2022 FINDINGS: No acute fracture of the pelvis or right hip. Femoral head is seated in the acetabulum, no hip dislocation. Joint spaces preserved. Pubic rami are intact. No pubic symphyseal or sacroiliac diastasis. There are prominent vascular calcifications. There may be mild lateral soft tissue edema. IMPRESSION: 1. No acute fracture of the pelvis or right hip. 2. Possible mild lateral soft tissue edema. Electronically Signed   By: Narda Rutherford M.D.   On: 11/11/2023 19:03   CT Cervical Spine Wo Contrast  Result Date: 11/11/2023 CLINICAL DATA:  Neck trauma EXAM: CT CERVICAL SPINE WITHOUT CONTRAST TECHNIQUE: Multidetector CT imaging of the cervical spine was performed without intravenous contrast. Multiplanar CT image reconstructions were also generated. RADIATION DOSE REDUCTION: This exam was performed according to the departmental dose-optimization program which includes automated exposure control, adjustment of the mA and/or kV according to patient size and/or use of iterative reconstruction technique. COMPARISON:  04/01/2022 FINDINGS: Alignment: Normal Skull base and vertebrae: No fracture or focal bone lesion. Soft tissues and spinal canal: No traumatic soft tissue finding. Disc levels: No significant disc  degeneration. No bony stenosis of the canal or foramina. No facet arthropathy. Upper chest: Mild emphysematous change and scarring. Other: None IMPRESSION: No acute or traumatic finding. Mild emphysematous change and scarring in the upper lungs. Emphysema (ICD10-J43.9). Electronically Signed   By: Paulina Fusi M.D.   On: 11/11/2023 16:47   CT Head Wo Contrast  Result Date: 11/11/2023 CLINICAL DATA:  Hypertension.  Head trauma, minor. EXAM: CT HEAD WITHOUT CONTRAST TECHNIQUE: Contiguous axial images were obtained from the base of the skull through the vertex without intravenous contrast. RADIATION DOSE REDUCTION: This exam was performed according to the departmental dose-optimization program which includes automated exposure control, adjustment of the mA and/or kV according to patient size and/or use of iterative reconstruction technique. COMPARISON:  04/01/2022 FINDINGS: Brain: No abnormality is seen affecting the brainstem or cerebellum. Right cerebral hemispheres normal. Left cerebral hemisphere shows an old frontal cortical and subcortical infarction. No evidence of acute infarction, mass lesion, hemorrhage, hydrocephalus or extra-axial collection. Vascular: There is atherosclerotic calcification of the major vessels at the base of the brain. Skull: Negative Sinuses/Orbits: Clear/normal Other: None IMPRESSION: No acute CT finding. 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 Electronically Signed   By: Paulina Fusi M.D.   On: 11/11/2023 16:45    Microbiology: Results for orders placed or performed in visit on 07/16/23  Urine Culture     Status: Abnormal   Collection Time: 07/16/23  3:30 PM  Result Value Ref Range Status   MICRO NUMBER: [id number]  Final   SPECIMEN QUALITY: Adequate  Final   Sample Source BLADDER  Final   STATUS: FINAL  Final   ISOLATE 1: Enterococcus faecalis (A)  Final    Comment: Greater than 100,000 CFU/mL of Enterococcus faecalis      Susceptibility   Enterococcus  faecalis - URINE CULTURE POSITIVE 1  AMPICILLIN <=2 Sensitive     VANCOMYCIN 1 Sensitive     NITROFURANTOIN* <=16 Sensitive      * Legend: S = Susceptible  I = Intermediate R = Resistant  NS = Not susceptible * = Not tested  NR = Not reported **NN = See antimicrobic comments     Labs: CBC: Recent Labs  Lab 12/02/23 1950 12/03/23 0652  WBC 11.2* 9.5  NEUTROABS 6.6  --   HGB 13.1 12.7*  HCT 41.4 42.1  MCV 74.9* 76.5*  PLT 302 260   Basic Metabolic Panel: Recent Labs  Lab 12/02/23 2001 12/02/23 2042 12/03/23 0652  NA  --  139 143  K  --  3.6 3.9  CL  --  107 110  CO2  --  21* 23  GLUCOSE  --  111* 88  BUN  --  14 17  CREATININE  --  0.89 0.89  CALCIUM  --  9.0 9.2  MG 2.1  --   --    Liver Function Tests: Recent Labs  Lab 12/02/23 2042  AST 23  ALT 19  ALKPHOS 59  BILITOT 0.4  PROT 6.9  ALBUMIN 3.5   CBG: Recent Labs  Lab 12/02/23 1947  GLUCAP 120*    Discharge time spent: {LESS THAN/GREATER THAN:26388} 30 minutes.  Signed: Tyrone Nine, MD [facility] 12/04/2023

## 2023-12-04 NOTE — TOC Transition Note (Addendum)
Transition of Care [facility]) - CM/SW Discharge Note   Patient Details  Name: [name] MRN: [id number] Date of Birth: 23-Nov-1951  [facility]) CM/SW Contact:  Lanier Clam, RN Phone Number: 12/04/2023, 9:00 AM   Clinical Narrative: spoke to patient about d/c plans-d/c plan home. Has PCP, pharmacy,own transport home. No needs or orders.    Final next level of care: Home/Self Care Barriers to Discharge: No Barriers Identified   Patient Goals and CMS Choice      Discharge Placement                         Discharge Plan and Services Additional resources added to the After Visit Summary for                                       Social Determinants of Health (SDOH) Interventions SDOH Screenings   Food Insecurity: No Food Insecurity (12/03/2023)  Recent Concern: Food Insecurity - Food Insecurity Present (11/12/2023)  Housing: Low Risk  (12/03/2023)  Recent Concern: Housing - Medium Risk (11/12/2023)  Transportation Needs: No Transportation Needs (12/03/2023)  Utilities: Not At Risk (12/03/2023)  Recent Concern: Utilities - At Risk (11/12/2023)  Tobacco Use: High Risk (11/11/2023)     Readmission Risk Interventions    11/12/2023   10:19 AM  Readmission Risk Prevention Plan  Transportation Screening Complete  PCP or Specialist Appt within 5-7 Days Complete  Home Care Screening Complete  Medication Review (RN CM) Complete

## 2023-12-04 NOTE — Plan of Care (Signed)

## 2023-12-05 LAB — URINE CULTURE: Culture: >=100000 — AB

## 2023-12-28 NOTE — Progress Notes (Deleted)
 Patient name: [name] MRN: [id number] DOB: [birth year] Sex: male  REASON FOR CONSULT: 3 month follow-up PAD, intermittent claudication   HPI: [name] is a 72 y.o. male, with history of diastolic CHF, diabetes, hypertension, HOCM presents for 39-month follow-up for further evaluation of his PAD and intermittent claudication.    Previously complaining of hurting in his legs for over 9 months usually after walking 100 to 200 feet.  No prior vascular inventions.  ABIs 05/2023 were 0.51 on the right monophasic and 0.46 on the left monophasic.  He smokes a pack every 3 days.  We pursue conservative measures.   Past Medical History:  Diagnosis Date   Alcohol dependence (HCC) 12/10/2013   Angioedema of lips 12/24/2013   Brain tumor (HCC)    CHF (congestive heart failure) (HCC)    diastolic   Chronic back pain    Chronic headache    CVA (cerebral vascular accident) (HCC)    Diabetes mellitus    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HCC) 02/18/2020   HTN (hypertension), malignant 09/06/2012   Hypertension    Narcotic abuse (HCC)    Normal cardiac stress test 2009   TIA (transient ischemic attack) 09/06/2012   Tobacco use disorder 12/10/2013   UTI (urinary tract infection)    Vertigo 09/06/2012    Past Surgical History:  Procedure Laterality Date   NO PAST SURGERIES      Family History  Problem Relation Age of Onset   CAD Father        heart attack in his 5's   Hypertension Father    Diabetes Brother    Diabetes Sister    Hypertension Mother     SOCIAL HISTORY: Social History   Socioeconomic History   Marital status: Legally Separated    Spouse name: Not on file   Number of children: Not on file   Years of education: Not on file   Highest education level: Not on file  Occupational History   Not on file  Tobacco Use   Smoking status: Every Day    Current packs/day: 0.50    Types: Cigarettes   Smokeless tobacco: Current  Vaping Use   Vaping  status: Never Used  Substance and Sexual Activity   Alcohol use: Not Currently    Comment: occ   Drug use: Not Currently    Types: Marijuana, Cocaine, IV    Comment: Heroin - one month ago   Sexual activity: Not on file  Other Topics Concern   Not on file  Social History Narrative   Single.  Lives alone.     Social Drivers of Corporate Investment Banker Strain: Not on file  Food Insecurity: No Food Insecurity (12/03/2023)   Hunger Vital Sign    Worried About Running Out of Food in the Last Year: Never true    Ran Out of Food in the Last Year: Never true  Recent Concern: Food Insecurity - Food Insecurity Present (11/12/2023)   Hunger Vital Sign    Worried About Running Out of Food in the Last Year: Sometimes true    Ran Out of Food in the Last Year: Never true  Transportation Needs: No Transportation Needs (12/03/2023)   PRAPARE - Administrator, Civil Service (Medical): No    Lack of Transportation (Non-Medical): No  Physical Activity: Not on file  Stress: Not on file  Social Connections: Not on file  Intimate Partner Violence: Not At Risk (12/03/2023)   Humiliation,  Afraid, Rape, and Kick questionnaire    Fear of Current or Ex-Partner: No    Emotionally Abused: No    Physically Abused: No    Sexually Abused: No    No Known Allergies  Current Outpatient Medications  Medication Sig Dispense Refill   albuterol  (PROVENTIL  HFA;VENTOLIN  HFA) 108 (90 Base) MCG/ACT inhaler Inhale 1-2 puffs into the lungs every 6 (six) hours as needed for wheezing. 1 Inhaler 0   aspirin  EC 81 MG tablet Take 81 mg by mouth every morning.     atorvastatin  (LIPITOR) 40 MG tablet Take 1 tablet (40 mg total) by mouth daily. 30 tablet 11   carvedilol  (COREG ) 25 MG tablet Take 1 tablet (25 mg total) by mouth 2 (two) times daily with a meal. 60 tablet 0   cephALEXin  (KEFLEX ) 500 MG capsule Take 1 capsule (500 mg total) by mouth 2 (two) times daily. 14 capsule 0   diclofenac  Sodium (VOLTAREN ) 1  % GEL Apply 2 g topically 4 (four) times daily. 50 g 11   hydrALAZINE  (APRESOLINE ) 100 MG tablet Take 1 tablet (100 mg total) by mouth every 8 (eight) hours. 90 tablet 0   metFORMIN  (GLUCOPHAGE ) 500 MG tablet Take 1 tablet (500 mg total) by mouth 2 (two) times daily. 60 tablet 11   nicotine  (NICODERM CQ  - DOSED IN MG/24 HOURS) 21 mg/24hr patch Place 1 patch (21 mg total) onto the skin daily. (Patient taking differently: Place 21 mg onto the skin daily as needed (smoking cessation).) 28 patch 0   omeprazole  (PRILOSEC) 20 MG capsule Take 1 capsule (20 mg total) by mouth daily. 30 capsule 0   ondansetron  (ZOFRAN -ODT) 8 MG disintegrating tablet Take 1 tablet (8 mg total) by mouth every 8 (eight) hours as needed for nausea or vomiting. 20 tablet 0   tamsulosin  (FLOMAX ) 0.4 MG CAPS capsule Take 1 capsule (0.4 mg total) by mouth daily after supper. 30 capsule 11   traMADol  (ULTRAM ) 50 MG tablet Take 1 tablet by mouth twice a day as needed for severe pains 10 tablet 0   verapamil  (CALAN -SR) 180 MG CR tablet Take 2 tablets (360 mg total) by mouth daily. 60 tablet 11   No current facility-administered medications for this visit.    REVIEW OF SYSTEMS:  [X]  denotes positive finding, [ ]  denotes negative finding Cardiac  Comments:  Chest pain or chest pressure:    Shortness of breath upon exertion:    Short of breath when lying flat:    Irregular heart rhythm:        Vascular    Pain in calf, thigh, or hip brought on by ambulation: x   Pain in feet at night that wakes you up from your sleep:     Blood clot in your veins:    Leg swelling:         Pulmonary    Oxygen at home:    Productive cough:     Wheezing:         Neurologic    Sudden weakness in arms or legs:     Sudden numbness in arms or legs:     Sudden onset of difficulty speaking or slurred speech:    Temporary loss of vision in one eye:     Problems with dizziness:         Gastrointestinal    Blood in stool:     Vomited blood:          Genitourinary    Burning when urinating:  Blood in urine:        Psychiatric    Major depression:         Hematologic    Bleeding problems:    Problems with blood clotting too easily:        Skin    Rashes or ulcers:        Constitutional    Fever or chills:      PHYSICAL EXAM: There were no vitals filed for this visit.  GENERAL: The patient is a well-nourished male, in no acute distress. The vital signs are documented above. CARDIAC: There is a regular rate and rhythm.  VASCULAR:  Bilateral femoral pulses palpable No palpable pedal pulses, no tissue loss PULMONARY: No respiratory distress. ABDOMEN: Soft and non-tender. MUSCULOSKELETAL: There are no major deformities or cyanosis. NEUROLOGIC: No focal weakness or paresthesias are detected. SKIN: There are no ulcers or rashes noted. PSYCHIATRIC: The patient has a normal affect.  DATA:    ABIs today (previously on 06/18/2023 they were 0.51 on the right monophasic and 0.46 on the left monophasic)  Assessment/Plan:  72 y.o. male, with history of diastolic CHF, diabetes, hypertension, HOCM presents for 81-month follow-up for further evaluation of his PAD and intermittent claudication.   I again discussed conservative therapy would be a good option for management.  Discussed walking therapies at least 30 minutes a day 3 times a week and walking through the discomfort to build collaterals.  I discussed cutting down on the cigarettes for smoking cessation.  I am hesitant to prescribe Pletal given his diastolic heart failure with HOCM.   Samuel DOROTHA Gaskins, MD [facility] Office: [phone]

## 2023-12-29 ENCOUNTER — Ambulatory Visit: Payer: Medicare HMO | Admitting: Vascular Surgery

## 2023-12-29 ENCOUNTER — Ambulatory Visit (HOSPITAL_COMMUNITY): Payer: Medicare HMO

## 2023-12-31 ENCOUNTER — Emergency Department (HOSPITAL_COMMUNITY): Payer: Medicare HMO

## 2023-12-31 ENCOUNTER — Encounter (HOSPITAL_COMMUNITY): Payer: Self-pay | Admitting: Emergency Medicine

## 2023-12-31 ENCOUNTER — Observation Stay (HOSPITAL_COMMUNITY)
Admission: EM | Admit: 2023-12-31 | Discharge: 2024-01-02 | Disposition: A | Discharge status: Home / Self Care | Payer: Medicare HMO | Attending: Emergency Medicine | Admitting: Emergency Medicine

## 2023-12-31 ENCOUNTER — Other Ambulatory Visit: Payer: Self-pay

## 2023-12-31 DIAGNOSIS — M25562 Pain in left knee: Secondary | ICD-10-CM | POA: Insufficient documentation

## 2023-12-31 DIAGNOSIS — W1830XA Fall on same level, unspecified, initial encounter: Secondary | ICD-10-CM | POA: Insufficient documentation

## 2023-12-31 DIAGNOSIS — S20212A Contusion of left front wall of thorax, initial encounter: Secondary | ICD-10-CM | POA: Insufficient documentation

## 2023-12-31 DIAGNOSIS — Z1152 Encounter for screening for COVID-19: Secondary | ICD-10-CM | POA: Insufficient documentation

## 2023-12-31 DIAGNOSIS — N400 Enlarged prostate without lower urinary tract symptoms: Secondary | ICD-10-CM | POA: Insufficient documentation

## 2023-12-31 DIAGNOSIS — N390 Urinary tract infection, site not specified: Secondary | ICD-10-CM | POA: Diagnosis not present

## 2023-12-31 DIAGNOSIS — R9431 Abnormal electrocardiogram [ECG] [EKG]: Secondary | ICD-10-CM | POA: Diagnosis present

## 2023-12-31 DIAGNOSIS — Z7982 Long term (current) use of aspirin: Secondary | ICD-10-CM | POA: Insufficient documentation

## 2023-12-31 DIAGNOSIS — I5032 Chronic diastolic (congestive) heart failure: Secondary | ICD-10-CM | POA: Insufficient documentation

## 2023-12-31 DIAGNOSIS — M25561 Pain in right knee: Secondary | ICD-10-CM | POA: Insufficient documentation

## 2023-12-31 DIAGNOSIS — I110 Hypertensive heart disease with heart failure: Secondary | ICD-10-CM | POA: Diagnosis not present

## 2023-12-31 DIAGNOSIS — I421 Obstructive hypertrophic cardiomyopathy: Secondary | ICD-10-CM | POA: Insufficient documentation

## 2023-12-31 DIAGNOSIS — I4581 Long QT syndrome: Secondary | ICD-10-CM | POA: Diagnosis not present

## 2023-12-31 DIAGNOSIS — E119 Type 2 diabetes mellitus without complications: Secondary | ICD-10-CM | POA: Diagnosis not present

## 2023-12-31 DIAGNOSIS — R531 Weakness: Principal | ICD-10-CM | POA: Insufficient documentation

## 2023-12-31 DIAGNOSIS — Z8673 Personal history of transient ischemic attack (TIA), and cerebral infarction without residual deficits: Secondary | ICD-10-CM | POA: Diagnosis not present

## 2023-12-31 DIAGNOSIS — Z79899 Other long term (current) drug therapy: Secondary | ICD-10-CM | POA: Insufficient documentation

## 2023-12-31 DIAGNOSIS — E11618 Type 2 diabetes mellitus with other diabetic arthropathy: Secondary | ICD-10-CM

## 2023-12-31 DIAGNOSIS — F17210 Nicotine dependence, cigarettes, uncomplicated: Secondary | ICD-10-CM | POA: Insufficient documentation

## 2023-12-31 DIAGNOSIS — I10 Essential (primary) hypertension: Secondary | ICD-10-CM | POA: Diagnosis present

## 2023-12-31 DIAGNOSIS — R29898 Other symptoms and signs involving the musculoskeletal system: Secondary | ICD-10-CM | POA: Insufficient documentation

## 2023-12-31 DIAGNOSIS — W19XXXA Unspecified fall, initial encounter: Principal | ICD-10-CM | POA: Insufficient documentation

## 2023-12-31 DIAGNOSIS — G8929 Other chronic pain: Secondary | ICD-10-CM | POA: Insufficient documentation

## 2023-12-31 DIAGNOSIS — N3000 Acute cystitis without hematuria: Secondary | ICD-10-CM | POA: Diagnosis present

## 2023-12-31 LAB — URINALYSIS, ROUTINE W REFLEX MICROSCOPIC
Bilirubin Urine: NEGATIVE
Glucose, UA: NEGATIVE mg/dL
Ketones, ur: NEGATIVE mg/dL
Leukocytes,Ua: NEGATIVE
Nitrite: NEGATIVE
Protein, ur: 100 mg/dL — AB
Specific Gravity, Urine: 1.015 (ref 1.005–1.030)
WBC, UA: >50 WBC/hpf (ref 0–5)
pH: 8 (ref 5.0–8.0)

## 2023-12-31 LAB — BASIC METABOLIC PANEL
Anion gap: 6 (ref 5–15)
BUN: 28 mg/dL — ABNORMAL HIGH (ref 8–23)
CO2: 23 mmol/L (ref 22–32)
Calcium: 9.4 mg/dL (ref 8.9–10.3)
Chloride: 109 mmol/L (ref 98–111)
Creatinine, Ser: 0.65 mg/dL (ref 0.61–1.24)
GFR, Estimated: >60 mL/min (ref >60)
Glucose, Bld: 95 mg/dL (ref 70–99)
Potassium: 3.9 mmol/L (ref 3.5–5.1)
Sodium: 138 mmol/L (ref 135–145)

## 2023-12-31 LAB — CBC WITH DIFFERENTIAL/PLATELET
Abs Immature Granulocytes: 0.05 10*3/uL (ref 0.00–0.07)
Basophils Absolute: 0.1 10*3/uL (ref 0.0–0.1)
Basophils Relative: 1 %
Eosinophils Absolute: 0.4 10*3/uL (ref 0.0–0.5)
Eosinophils Relative: 4 %
HCT: 43.4 % (ref 39.0–52.0)
Hemoglobin: 13.2 g/dL (ref 13.0–17.0)
Immature Granulocytes: 0 %
Lymphocytes Relative: 28 %
Lymphs Abs: 3.2 10*3/uL (ref 0.7–4.0)
MCH: 23.2 pg — ABNORMAL LOW (ref 26.0–34.0)
MCHC: 30.4 g/dL (ref 30.0–36.0)
MCV: 76.4 fL — ABNORMAL LOW (ref 80.0–100.0)
Monocytes Absolute: 0.7 10*3/uL (ref 0.1–1.0)
Monocytes Relative: 6 %
Neutro Abs: 7.2 10*3/uL (ref 1.7–7.7)
Neutrophils Relative %: 61 %
Platelets: 271 10*3/uL (ref 150–400)
RBC: 5.68 MIL/uL (ref 4.22–5.81)
RDW: 15.3 % (ref 11.5–15.5)
WBC: 11.7 10*3/uL — ABNORMAL HIGH (ref 4.0–10.5)
nRBC: 0 % (ref 0.0–0.2)

## 2023-12-31 MED ORDER — FOSFOMYCIN TROMETHAMINE 3 G PO PACK
3.0000 g | PACK | Freq: Once | ORAL | Status: AC
  Administered 2024-01-01: 3 g via ORAL
  Filled 2023-12-31: qty 3

## 2023-12-31 MED ORDER — LORAZEPAM 2 MG/ML IJ SOLN
0.5000 mg | INTRAMUSCULAR | Status: AC
  Administered 2023-12-31: 0.5 mg via INTRAVENOUS
  Filled 2023-12-31: qty 1

## 2023-12-31 NOTE — ED Provider Triage Note (Signed)
 Emergency Medicine Provider Triage Evaluation Note  [name] , a 73 y.o. male  was evaluated in triage.  Pt complains of frequent falls today.  He states that he fell 3 times.  He did not hit his head or lose consciousness.  Complaining of bilateral knee pain and left rib pain.  States his legs just do not want to work.  Review of Systems  Positive:  Negative: See above   Physical Exam  BP (!) 173/57 (BP Location: Left Arm)   Pulse (!) 55   Temp 98.3 F (36.8 C) (Oral)   Resp 16   Ht 5' 9 (1.753 m)   Wt 72.6 kg   SpO2 99%   BMI 23.63 kg/m  Gen:   Awake, no distress   Resp:  Normal effort  MSK:   Moves extremities without difficulty  Other:  Tenderness to the left lateral chest wall and bilateral knees.  Medical Decision Making  Medically screening exam initiated at 12:27 PM.  Appropriate orders placed.  [name] was informed that the remainder of the evaluation will be completed by another provider, this initial triage assessment does not replace that evaluation, and the importance of remaining in the ED until their evaluation is complete.     Theotis Peers [place], [place] 12/31/23 1228

## 2023-12-31 NOTE — ED Provider Notes (Signed)
 [facility] AT [facility] Provider Note   CSN: [id number] Arrival date & time: 12/31/23  1141     History  Chief Complaint  Patient presents with   Fall   Abdominal Pain   Rib Injury    [name] is a 73 y.o. male.  73 year old male with history of HFpEF, HOCM, CVA, diabetes, and polysubstance abuse who presents emergency department with left lower extremity weakness and falls.  Patient says that he went to bed last night at an unknown time in his normal state of health.  This morning he woke up and had several falls because his left leg kept giving out.  No neck or back pain.  Says that he fell to his knees and then onto his left side.  Having left rib pain as well.  No head strike or LOC.  Does have a history of a stroke and thinks that his left leg may have been weak with a stroke.  Says that he has foul-smelling urine but no dysuria or frequency.  No penile discharge.  Not sexually active.  Last used narcotics approximately a week ago.  No recent cocaine use.  No alcohol use.       Home Medications Prior to Admission medications   Medication Sig Start Date End Date Taking? Authorizing Provider  albuterol  (PROVENTIL  HFA;VENTOLIN  HFA) 108 (90 Base) MCG/ACT inhaler Inhale 1-2 puffs into the lungs every 6 (six) hours as needed for wheezing. 09/04/16   Lynwood Anes, MD  aspirin  EC 81 MG tablet Take 81 mg by mouth every morning.    [provider]  atorvastatin  (LIPITOR) 40 MG tablet Take 1 tablet (40 mg total) by mouth daily. 11/12/23   Danford, Lonni SQUIBB, MD  carvedilol  (COREG ) 25 MG tablet Take 1 tablet (25 mg total) by mouth 2 (two) times daily with a meal. 12/04/23   Bryn Bernardino NOVAK, MD  cephALEXin  (KEFLEX ) 500 MG capsule Take 1 capsule (500 mg total) by mouth 2 (two) times daily. 12/04/23   Bryn Bernardino NOVAK, MD  diclofenac  Sodium (VOLTAREN ) 1 % GEL Apply 2 g topically 4 (four) times daily. 11/12/23   Danford, Lonni SQUIBB, MD  hydrALAZINE   (APRESOLINE ) 100 MG tablet Take 1 tablet (100 mg total) by mouth every 8 (eight) hours. 12/04/23   Bryn Bernardino NOVAK, MD  metFORMIN  (GLUCOPHAGE ) 500 MG tablet Take 1 tablet (500 mg total) by mouth 2 (two) times daily. 11/12/23   Danford, Lonni SQUIBB, MD  nicotine  (NICODERM CQ  - DOSED IN MG/24 HOURS) 21 mg/24hr patch Place 1 patch (21 mg total) onto the skin daily. Patient taking differently: Place 21 mg onto the skin daily as needed (smoking cessation). 02/21/20   Sebastian Toribio GAILS, MD  omeprazole  (PRILOSEC) 20 MG capsule Take 1 capsule (20 mg total) by mouth daily. 12/14/22   Armenta Canning, MD  ondansetron  (ZOFRAN -ODT) 8 MG disintegrating tablet Take 1 tablet (8 mg total) by mouth every 8 (eight) hours as needed for nausea or vomiting. 08/19/22   Levander Houston, MD  tamsulosin  (FLOMAX ) 0.4 MG CAPS capsule Take 1 capsule (0.4 mg total) by mouth daily after supper. 11/12/23   Danford, Lonni SQUIBB, MD  traMADol  (ULTRAM ) 50 MG tablet Take 1 tablet by mouth twice a day as needed for severe pains 06/04/21     verapamil  (CALAN -SR) 180 MG CR tablet Take 2 tablets (360 mg total) by mouth daily. 11/12/23   Danford, Lonni SQUIBB, MD      Allergies  Patient has no known allergies.    Review of Systems   Review of Systems  Physical Exam Updated Vital Signs BP (!) 192/64   Pulse (!) 56   Temp 99.7 F (37.6 C) (Oral)   Resp 16   Ht 5' 9 (1.753 m)   Wt 72.6 kg   SpO2 98%   BMI 23.63 kg/m  Physical Exam Vitals and nursing note reviewed.  Constitutional:      General: He is not in acute distress.    Appearance: Normal appearance. He is well-developed. He is not ill-appearing.  HENT:     Head: Normocephalic and atraumatic.     Right Ear: External ear normal.     Left Ear: External ear normal.     Nose: Nose normal.     Mouth/Throat:     Mouth: Mucous membranes are moist.     Pharynx: Oropharynx is clear.  Eyes:     Extraocular Movements: Extraocular movements intact.      Conjunctiva/sclera: Conjunctivae normal.     Pupils: Pupils are equal, round, and reactive to light.  Neck:     Comments: No C-spine midline tenderness to palpation Cardiovascular:     Rate and Rhythm: Normal rate and regular rhythm.     Pulses: Normal pulses.     Heart sounds: Normal heart sounds.  Pulmonary:     Effort: Pulmonary effort is normal. No respiratory distress.     Breath sounds: Normal breath sounds.  Abdominal:     General: Abdomen is flat. There is no distension.     Palpations: Abdomen is soft. There is no mass.     Tenderness: There is no abdominal tenderness. There is no guarding.  Musculoskeletal:        General: No deformity. Normal range of motion.     Cervical back: Normal range of motion and neck supple. No rigidity or tenderness.     Right lower leg: No edema.     Left lower leg: No edema.     Comments: Tenderness to palpation of left chest wall.  No bruising noted.  No tenderness to palpation of bilateral clavicles.  No tenderness to palpation, bruising, or deformities noted of bilateral shoulders, elbows, wrists, hips, or ankles.  Tenderness palpation of bilateral knees.  Skin:    General: Skin is warm and dry.  Neurological:     Mental Status: He is alert.     Comments: MENTAL STATUS: AAOx3 CRANIAL NERVES: II: Pupils equal and reactive 4 mm BL, no RAPD, no VF deficits III, IV, VI: EOM intact, no gaze preference or deviation, no nystagmus. V: normal sensation to light touch in V1, V2, and V3 segments bilaterally VII: no facial weakness or asymmetry, no nasolabial fold flattening VIII: normal hearing to speech and finger friction IX, X: normal palatal elevation, no uvular deviation XI: 5/5 head turn and 5/5 shoulder shrug bilaterally XII: midline tongue protrusion MOTOR: 5/5 strength in bilateral upper extremities and right lower extremity.  4/5 strength in left lower extremity SENSORY: Normal sensation to light touch in all extremities No aphasia or  neglect   Psychiatric:        Mood and Affect: Mood normal.        Behavior: Behavior normal.     ED Results / Procedures / Treatments   Labs (all labs ordered are listed, but only abnormal results are displayed) Labs Reviewed  CBC WITH DIFFERENTIAL/PLATELET - Abnormal; Notable for the following components:      Result Value  WBC 11.7 (*)    MCV 76.4 (*)    MCH 23.2 (*)    All other components within normal limits  BASIC METABOLIC PANEL - Abnormal; Notable for the following components:   BUN 28 (*)    All other components within normal limits  URINALYSIS, ROUTINE W REFLEX MICROSCOPIC - Abnormal; Notable for the following components:   Color, Urine AMBER (*)    APPearance CLOUDY (*)    Hgb urine dipstick SMALL (*)    Protein, ur 100 (*)    Bacteria, UA MANY (*)    Non Squamous Epithelial 0-5 (*)    All other components within normal limits  URINE CULTURE  RAPID URINE DRUG SCREEN, HOSP PERFORMED    EKG None  Radiology DG Knee Complete 4 Views Left Result Date: 12/31/2023 CLINICAL DATA:  Clemens, pain, limited range of motion EXAM: LEFT KNEE - COMPLETE 4+ VIEW COMPARISON:  None Available. FINDINGS: Frontal and lateral views of the left knee are obtained. No acute fracture, subluxation, or dislocation. Joint spaces are well preserved. No joint effusion. Atherosclerosis. Soft tissues are otherwise unremarkable. IMPRESSION: 1. No acute bony abnormality. Electronically Signed   By: Ozell Daring M.D.   On: 12/31/2023 16:38   DG Knee Complete 4 Views Right Result Date: 12/31/2023 CLINICAL DATA:  Fall, limited range of motion RIGHT KNEE - COMPLETE 4+ VIEW COMPARISON:  None Available. FINDINGS: No evidence of fracture, dislocation, or joint effusion. No evidence of arthropathy or other focal bone abnormality. Patchy femoral-popliteal arterial calcifications. Soft tissues are unremarkable. IMPRESSION: Negative. Electronically Signed   By: JONETTA Faes M.D.   On: 12/31/2023 16:20   DG Ribs  Unilateral W/Chest Left Result Date: 12/31/2023 CLINICAL DATA:  Clemens, left rib and abdominal pain EXAM: LEFT RIBS AND CHEST - 3+ VIEW COMPARISON:  12/02/2023 FINDINGS: Frontal view of the chest as well as frontal and oblique views of the left thoracic cage are obtained. Stable enlargement of the cardiac silhouette. No airspace disease, effusion, or pneumothorax. No acute displaced fractures. IMPRESSION: 1. No acute intrathoracic process. 2. No displaced rib fracture. Electronically Signed   By: Ozell Daring M.D.   On: 12/31/2023 16:19    Procedures Procedures    Medications Ordered in ED Medications  fosfomycin (MONUROL ) packet 3 g (has no administration in time range)  LORazepam  (ATIVAN ) injection 0.5 mg (0.5 mg Intravenous Given 12/31/23 2331)    ED Course/ Medical Decision Making/ A&P Clinical Course as of 01/01/24 0015  Fri Jan 01, 2024  0007 Signed out to Dr Midge.  [RP]    Clinical Course User Index [RP] Yolande Lamar BROCKS, MD                                 Medical Decision Making Amount and/or Complexity of Data Reviewed Labs: ordered. Radiology: ordered. ECG/medicine tests: ordered.  Risk Prescription drug management.   [name] is a 73 y.o. male with comorbidities that complicate the patient evaluation including HFpEF, HOCM, CVA, diabetes, and polysubstance abuse who presents emergency department with left lower extremity weakness and falls.    Initial Ddx:  Stroke, C-spine injury, lumbar spine injury, fracture  MDM/Course:  Patient presents emergency department with several falls due to his left leg giving out.  No head strike or LOC during these falls.  Not complaining of any neck or back pain.  Does have some left leg weakness on exam which she says  is similar to prior stroke that he had.  His labs show that he has a UTI at this time.  Based on prior cultures we will give him fosfomycin in the emergency department.  Will obtain an MRI to evaluate for  possible stroke.  Unfortunately is outside of the window for any sort of stroke activation since he does not have an LVO and last known well was when he went to bed last night.  If negative feel that it is likely pain from his fall that is causing his weakness or could be recrudescence of his stroke in the setting of his UTI.  He did also have bilateral knee x-rays that did not reveal evidence of fracture.  Rib x-rays without fracture or acute abnormality either.  This patient presents to the ED for concern of complaints listed in HPI, this involves an extensive number of treatment options, and is a complaint that carries with it a high risk of complications and morbidity. Disposition including potential need for admission considered.   Dispo: Pending remainder of workup  Records reviewed Outpatient Clinic Notes The following labs were independently interpreted: Urinalysis and show urinary tract infection I have reviewed the patients home medications and made adjustments as needed Social Determinants of health:  Substance use  Portions of this note were generated with Scientist, clinical (histocompatibility and immunogenetics). Dictation errors may occur despite best attempts at proofreading.     Final Clinical Impression(s) / ED Diagnoses Final diagnoses:  Fall, initial encounter  Contusion of rib on left side, initial encounter  Lower urinary tract infectious disease  Acute pain of both knees  Weakness of left lower extremity    Rx / DC Orders ED Discharge Orders     None         Yolande Lamar BROCKS, MD 01/01/24 [phone]

## 2023-12-31 NOTE — ED Notes (Signed)
 Patient transported to MRI

## 2023-12-31 NOTE — ED Triage Notes (Signed)
 Pt arrives via POV. Pt reports he has fallen 3 times today due to his left leg giving out. Pt landed on left side, endorses pain to left upper and lower abdomen and left side of ribs. No bruising noted. Pt denies hitting his head, no loc, no blood thinners. Pt AxOx4.

## 2023-12-31 NOTE — ED Notes (Signed)
 Pt refused vitals

## 2024-01-01 ENCOUNTER — Encounter (HOSPITAL_COMMUNITY): Payer: Self-pay | Admitting: Internal Medicine

## 2024-01-01 DIAGNOSIS — W19XXXA Unspecified fall, initial encounter: Secondary | ICD-10-CM | POA: Insufficient documentation

## 2024-01-01 DIAGNOSIS — R9431 Abnormal electrocardiogram [ECG] [EKG]: Secondary | ICD-10-CM

## 2024-01-01 DIAGNOSIS — E119 Type 2 diabetes mellitus without complications: Secondary | ICD-10-CM

## 2024-01-01 DIAGNOSIS — N400 Enlarged prostate without lower urinary tract symptoms: Secondary | ICD-10-CM

## 2024-01-01 DIAGNOSIS — R531 Weakness: Principal | ICD-10-CM

## 2024-01-01 DIAGNOSIS — N3000 Acute cystitis without hematuria: Secondary | ICD-10-CM | POA: Diagnosis present

## 2024-01-01 DIAGNOSIS — I10 Essential (primary) hypertension: Secondary | ICD-10-CM

## 2024-01-01 DIAGNOSIS — I421 Obstructive hypertrophic cardiomyopathy: Secondary | ICD-10-CM

## 2024-01-01 LAB — CBC WITH DIFFERENTIAL/PLATELET
Abs Immature Granulocytes: 0.02 10*3/uL (ref 0.00–0.07)
Basophils Absolute: 0.1 10*3/uL (ref 0.0–0.1)
Basophils Relative: 1 %
Eosinophils Absolute: 0.4 10*3/uL (ref 0.0–0.5)
Eosinophils Relative: 4 %
HCT: 37.7 % — ABNORMAL LOW (ref 39.0–52.0)
Hemoglobin: 11.4 g/dL — ABNORMAL LOW (ref 13.0–17.0)
Immature Granulocytes: 0 %
Lymphocytes Relative: 32 %
Lymphs Abs: 3.2 10*3/uL (ref 0.7–4.0)
MCH: 23.1 pg — ABNORMAL LOW (ref 26.0–34.0)
MCHC: 30.2 g/dL (ref 30.0–36.0)
MCV: 76.5 fL — ABNORMAL LOW (ref 80.0–100.0)
Monocytes Absolute: 0.8 10*3/uL (ref 0.1–1.0)
Monocytes Relative: 8 %
Neutro Abs: 5.5 10*3/uL (ref 1.7–7.7)
Neutrophils Relative %: 55 %
Platelets: 243 10*3/uL (ref 150–400)
RBC: 4.93 MIL/uL (ref 4.22–5.81)
RDW: 15.3 % (ref 11.5–15.5)
WBC: 10 10*3/uL (ref 4.0–10.5)
nRBC: 0 % (ref 0.0–0.2)

## 2024-01-01 LAB — MAGNESIUM: Magnesium: 1.9 mg/dL (ref 1.7–2.4)

## 2024-01-01 LAB — COMPREHENSIVE METABOLIC PANEL
ALT: 19 U/L (ref 0–44)
AST: 24 U/L (ref 15–41)
Albumin: 3.3 g/dL — ABNORMAL LOW (ref 3.5–5.0)
Alkaline Phosphatase: 52 U/L (ref 38–126)
Anion gap: 9 (ref 5–15)
BUN: 25 mg/dL — ABNORMAL HIGH (ref 8–23)
CO2: 26 mmol/L (ref 22–32)
Calcium: 9.4 mg/dL (ref 8.9–10.3)
Chloride: 108 mmol/L (ref 98–111)
Creatinine, Ser: 0.79 mg/dL (ref 0.61–1.24)
GFR, Estimated: >60 mL/min (ref >60)
Glucose, Bld: 93 mg/dL (ref 70–99)
Potassium: 3.6 mmol/L (ref 3.5–5.1)
Sodium: 143 mmol/L (ref 135–145)
Total Bilirubin: 0.6 mg/dL (ref 0.0–1.2)
Total Protein: 6.9 g/dL (ref 6.5–8.1)

## 2024-01-01 LAB — RAPID URINE DRUG SCREEN, HOSP PERFORMED
Amphetamines: NOT DETECTED
Barbiturates: NOT DETECTED
Benzodiazepines: NOT DETECTED
Cocaine: NOT DETECTED
Opiates: POSITIVE — AB
Tetrahydrocannabinol: NOT DETECTED

## 2024-01-01 LAB — TSH: TSH: 0.215 u[IU]/mL — ABNORMAL LOW (ref 0.350–4.500)

## 2024-01-01 LAB — BRAIN NATRIURETIC PEPTIDE: B Natriuretic Peptide: 253.1 pg/mL — ABNORMAL HIGH (ref 0.0–100.0)

## 2024-01-01 LAB — VITAMIN B12: Vitamin B-12: 516 pg/mL (ref 180–914)

## 2024-01-01 LAB — CBG MONITORING, ED
Glucose-Capillary: 107 mg/dL — ABNORMAL HIGH (ref 70–99)
Glucose-Capillary: 121 mg/dL — ABNORMAL HIGH (ref 70–99)

## 2024-01-01 LAB — GLUCOSE, CAPILLARY
Glucose-Capillary: 125 mg/dL — ABNORMAL HIGH (ref 70–99)
Glucose-Capillary: 141 mg/dL — ABNORMAL HIGH (ref 70–99)

## 2024-01-01 MED ORDER — TAMSULOSIN HCL 0.4 MG PO CAPS
0.4000 mg | ORAL_CAPSULE | Freq: Every day | ORAL | Status: DC
Start: 2024-01-01 — End: 2024-01-02
  Administered 2024-01-01: 0.4 mg via ORAL
  Filled 2024-01-01: qty 1

## 2024-01-01 MED ORDER — ACETAMINOPHEN 325 MG PO TABS
650.0000 mg | ORAL_TABLET | Freq: Four times a day (QID) | ORAL | Status: DC | PRN
  Administered 2024-01-02: 650 mg via ORAL
  Filled 2024-01-01: qty 2

## 2024-01-01 MED ORDER — HYDRALAZINE HCL 25 MG PO TABS
100.0000 mg | ORAL_TABLET | Freq: Three times a day (TID) | ORAL | Status: DC
  Administered 2024-01-01 – 2024-01-02 (×4): 100 mg via ORAL
  Filled 2024-01-01 (×4): qty 4

## 2024-01-01 MED ORDER — ATORVASTATIN CALCIUM 40 MG PO TABS
40.0000 mg | ORAL_TABLET | Freq: Every day | ORAL | Status: DC
  Administered 2024-01-01 – 2024-01-02 (×2): 40 mg via ORAL
  Filled 2024-01-01 (×2): qty 1

## 2024-01-01 MED ORDER — HYDRALAZINE HCL 20 MG/ML IJ SOLN
10.0000 mg | INTRAMUSCULAR | Status: AC | PRN
  Administered 2024-01-01 (×2): 10 mg via INTRAVENOUS
  Filled 2024-01-01 (×2): qty 1

## 2024-01-01 MED ORDER — ONDANSETRON HCL 4 MG/2ML IJ SOLN: 4.0000 mg | Freq: Four times a day (QID) | INTRAMUSCULAR | Status: DC | PRN

## 2024-01-01 MED ORDER — INSULIN ASPART 100 UNIT/ML IJ SOLN
0.0000 [IU] | Freq: Three times a day (TID) | INTRAMUSCULAR | Status: DC
  Filled 2024-01-01: qty 0.06

## 2024-01-01 MED ORDER — ACETAMINOPHEN 650 MG RE SUPP: 650.0000 mg | Freq: Four times a day (QID) | RECTAL | Status: DC | PRN

## 2024-01-01 MED ORDER — MELATONIN 3 MG PO TABS: 3.0000 mg | ORAL_TABLET | Freq: Every evening | ORAL | Status: DC | PRN

## 2024-01-01 MED ORDER — PANTOPRAZOLE SODIUM 40 MG PO TBEC
40.0000 mg | DELAYED_RELEASE_TABLET | Freq: Every day | ORAL | Status: DC
  Administered 2024-01-01 – 2024-01-02 (×2): 40 mg via ORAL
  Filled 2024-01-01 (×2): qty 1

## 2024-01-01 NOTE — ED Provider Notes (Signed)
 Patient is a poor historian.  Initial signout was to get MRI then ambulate patient.  MRI negative that he is still unable to walk.  Patient tells me that his leg feels similar to previous stroke but is not any worse.  However he is unable to explain why he cannot walk Patient has no new back pain or spinal tenderness from recent fall.  No signs of any traumatic injury and has been resting comfortably  Due to difficulty ambulating, he is a high fall risk.  Patient will be admitted Discussed with Dr. Marcene for admission   Midge Golas, MD 01/01/24 [phone]

## 2024-01-01 NOTE — Care Management Obs Status (Signed)
 MEDICARE OBSERVATION STATUS NOTIFICATION   Patient Details  Name: [name] MRN: [id number] Date of Birth: [birth year]   Medicare Observation Status Notification Given:  Yes    Lavenia Atlas, RN 01/01/2024, 1:21 PM

## 2024-01-01 NOTE — Progress Notes (Signed)
 PT Cancellation Note  Patient Details Name: [name] MRN: [id number] DOB: 04/04/1951   Cancelled Treatment:    Reason Eval/Treat Not Completed: Patient declined, no reason specified Pt just got to room from ED.  Pt shaking head no at therapist as soon as walked in.  Noted also was lethargic and didn't talk to MD much.  Shook head yes for therapy to return tomorrow.  [name], PT Acute Rehab [facility] [phone]   [name] [place] 01/01/2024, 4:51 PM

## 2024-01-01 NOTE — ED Notes (Signed)
Pt transport to MRI

## 2024-01-01 NOTE — ED Notes (Signed)
 Ambulated Pt and pt had difficulty in walking without assistance and was quickly return to the bed.

## 2024-01-01 NOTE — Progress Notes (Signed)
 Briefly, patient is a 73 year old man with DM2, chronic LLE weakness, HTN who was admitted with generalized weakness earlier today after mechanical fall from ground-level.  Patient was thought to have UTI and was admitted for evaluation of his weakness and treatment of UTI.  When I went to see the patient he was sleepy, did not really want to wake up and speak with me.  Said that he was still feeling very weak.  He denied dysuria to me although he did mention it to the admitting physician earlier this morning.  Agree with continuing plan initiated earlier today.  Patient was treated with a single dose of fosfomycin.  Repeat labs and urine are pending.  Patient was noted to have an elevated QTc despite normal potassium and magnesium , thought to possibly be related to hypertrophic cardiomyopathy.  Will repeat.

## 2024-01-01 NOTE — H&P (Signed)
 History and Physical      [name] FMW:[id number] DOB: 12/09/1951 DOA: 12/31/2023; DOS: 01/01/2024  PCP: Shelda Atlas, MD  Patient coming from: home   I have personally briefly reviewed patient's old medical records in [facility]  Chief Complaint: fall  HPI: [name] is a 73 y.o. 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  The patient reports that he tripped while attempting to ambulate earlier in the day resulting in a ground-level fall in which the bilateral knees were the principal point of contact with the ground.  He conveys that he did not hit his head as a component of this fall, and denies any associated loss consciousness.  No associated acute headache or neck pain as a consequence of this sequence.  He reports mild discomfort over the anterior aspect of the bilateral knees that started with impact of the knees with the ground.  He is on a daily baby aspirin , but otherwise on no additional blood thinners as an outpatient.  He states that this occurred in the setting of 2 3 days of generalized weakness in the absence of any acute focal weakness.  He conveys that he has chronic left lower extremity weakness in the setting of a history of ischemic CVA.  He does not feel that the left lower extremity weakness is any worse than his baseline at this time.  Denies any new acute focal weakness, nor any acute focal numbness, paresthesias.  Over the last 2 days, is also noted some new onset dysuria.  He has a history of recurrent urinary tract infections, and conveys that the above presentation is similar to that which she has experienced at times of previous urinary tract infections.  Denies associated subjective fever,  chills or rigors, generalized myalgias.  No recent chest pain or shortness of breath.      ED Course:  Vital signs in the ED were notable for the following: Temperature max 99.7; rates in the 50; stop blood pressures in the 170s to 190s; respiratory rate 15-20, oxygen saturation 97 to 100% on room air.  Labs were notable for the following: BMP notable for sodium 130, Tessman 3.9, creatinine 0.65 compared to 0.89 on 12/03/2023, glucose 95.  CBC notable for what was about 11,700.  Urinalysis associated cloudy appearing specimen and notable for greater than 50 red blood cells, many bacteria, and evidence discos epithelial cells.  Urine culture collected.  Per my interpretation, EKG in ED demonstrated the following: Compared to most recent prior EKG on 12/02/2023, today's EKG shows sinus rhythm with rate 56, prolonged QTc of 523, nonspecific T wave inversions in leads III, aVF, and V4 through V6, of which T wave inversions in V4 through V6.  Change from most recent prior EKG, also showing nonspecific less than millimeter ST depression in leads II, V5, V6, all of which appear similar to most recent prior EKG, while showing no evidence of new ST changes, including no evidence of ST elevation.  Imaging in the ED, per corresponding formal radiology read, was notable for the following: Chest x-ray shows no evidence of acute cardiopulmonary process.  Plan was of the bilateral knee showed no evidence of acute fracture or dislocation.  MRI brain showed no evidence of acute intracranial process, including no evidence  of acute infarct.  While in the ED, the following were administered: Fosfomycin 3 g p.o. x 1 dose, Ativan  0.5 mg IV x 1 dose.  Subsequently, the patient was admitted for further evaluation management of presenting generalized weakness with resultant mechanical ground-level fall, in the setting of acute cystitis, with presenting workup also notable for QTc prolongation.  red   Review of Systems:  As per HPI otherwise 10 point review of systems negative.   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 He uses smokeless tobacco. He reports that he does not currently use alcohol. He reports that he does not currently use drugs after having used the following drugs: Marijuana, Cocaine, and IV.   No Known Allergies  Family History  Problem Relation Age of Onset   CAD Father        heart attack in his 59's   Hypertension Father    Diabetes Brother    Diabetes Sister    Hypertension Mother     Family history reviewed and not pertinent    Prior to Admission medications   Medication Sig Start Date End Date Taking? Authorizing Provider  albuterol  (PROVENTIL  HFA;VENTOLIN  HFA) 108 (90 Base) MCG/ACT inhaler Inhale 1-2 puffs into the lungs every 6 (six) hours as needed for wheezing. 09/04/16   Lynwood Anes, MD  aspirin  EC 81 MG tablet Take 81 mg by mouth every morning.    [provider]  atorvastatin  (LIPITOR) 40 MG tablet Take 1 tablet (40 mg total) by mouth daily. 11/12/23   Danford, Lonni SQUIBB, MD  carvedilol  (COREG ) 25 MG tablet Take 1 tablet (25 mg total) by mouth 2 (two) times daily with a meal. 12/04/23   Bryn Bernardino NOVAK, MD  cephALEXin  (KEFLEX ) 500 MG capsule Take 1 capsule  (500 mg total) by mouth 2 (two) times daily. 12/04/23   Bryn Bernardino NOVAK, MD  diclofenac  Sodium (VOLTAREN ) 1 % GEL Apply 2 g topically 4 (four) times daily. 11/12/23   Danford, Lonni SQUIBB, MD  hydrALAZINE  (APRESOLINE ) 100 MG tablet Take 1 tablet (100 mg total) by mouth every 8 (eight) hours. 12/04/23   Bryn Bernardino NOVAK, MD  metFORMIN  (GLUCOPHAGE ) 500 MG tablet Take 1 tablet (500 mg total) by mouth 2 (two) times daily. 11/12/23   Danford, Lonni SQUIBB, MD  nicotine  (NICODERM CQ  - DOSED IN MG/24 HOURS) 21 mg/24hr patch Place 1 patch (21 mg total) onto the skin daily. Patient taking differently: Place 21 mg onto the skin daily as needed (smoking cessation). 02/21/20   Sebastian Toribio GAILS, MD  omeprazole  (PRILOSEC) 20 MG capsule Take 1 capsule (20 mg total) by mouth daily. 12/14/22   Armenta Canning, MD  ondansetron  (ZOFRAN -ODT) 8 MG disintegrating tablet  Take 1 tablet (8 mg total) by mouth every 8 (eight) hours as needed for nausea or vomiting. 08/19/22   Levander Houston, MD  tamsulosin  (FLOMAX ) 0.4 MG CAPS capsule Take 1 capsule (0.4 mg total) by mouth daily after supper. 11/12/23   Danford, Lonni SQUIBB, MD  traMADol  (ULTRAM ) 50 MG tablet Take 1 tablet by mouth twice a day as needed for severe pains 06/04/21     verapamil  (CALAN -SR) 180 MG CR tablet Take 2 tablets (360 mg total) by mouth daily. 11/12/23   Jonel Lonni SQUIBB, MD     Objective    Physical Exam: Vitals:   01/01/24 0052 01/01/24 0215 01/01/24 0221 01/01/24 0332  BP: (!) 227/57 (!) 185/89  (!) 199/43  Pulse: (!) 57 (!) 49  (!) 52  Resp: 20 16  20   Temp: 98.2 F (36.8 C)  97.9 F (36.6 C)   TempSrc: Oral     SpO2: 96% 91%  98%  Weight:      Height:        General: appears to be stated age; alert, oriented Skin: warm, dry, no rash Head:  AT/[place] Mouth:  Oral mucosa membranes appear moist, normal dentition Neck: supple; trachea midline Heart:  RRR; did not appreciate any M/R/G Lungs: CTAB, did not appreciate any wheezes, rales,  or rhonchi Abdomen: + BS; soft, ND, NT Vascular: 2+ pedal pulses b/l; 2+ radial pulses b/l Extremities: no peripheral edema, no muscle wasting Neuro: 4/5 strength involving the left lower extremity ; otherwise, strength and sensation intact in upper and lower extremities b/l    Labs on Admission: I have personally reviewed following labs and imaging studies  CBC: Recent Labs  Lab 12/31/23 1305  WBC 11.7*  NEUTROABS 7.2  HGB 13.2  HCT 43.4  MCV 76.4*  PLT 271   Basic Metabolic Panel: Recent Labs  Lab 12/31/23 1305  NA 138  K 3.9  CL 109  CO2 23  GLUCOSE 95  BUN 28*  CREATININE 0.65  CALCIUM  9.4   GFR: Estimated Creatinine Clearance: 84.7 mL/min (by C-G formula based on SCr of 0.65 mg/dL). Liver Function Tests: No results for input(s): AST, ALT, ALKPHOS, BILITOT, PROT, ALBUMIN in the last 168 hours. No results for input(s): LIPASE, AMYLASE in the last 168 hours. No results for input(s): AMMONIA in the last 168 hours. Coagulation Profile: No results for input(s): INR, PROTIME in the last 168 hours. Cardiac Enzymes: No results for input(s): CKTOTAL, CKMB, CKMBINDEX, TROPONINI in the last 168 hours. BNP (last 3 results) No results for input(s): PROBNP in the last 8760 hours. HbA1C: No results for input(s): HGBA1C in the last 72 hours. CBG: No results for input(s): GLUCAP in the last 168 hours. Lipid Profile: No results for input(s): CHOL, HDL, LDLCALC, TRIG, CHOLHDL, LDLDIRECT in the last 72 hours. Thyroid  Function Tests: No results for input(s): TSH, T4TOTAL, FREET4, T3FREE, THYROIDAB in the last 72 hours. Anemia Panel: No results for input(s): VITAMINB12, FOLATE, FERRITIN, TIBC, IRON, RETICCTPCT in the last 72 hours. Urine analysis:    Component Value Date/Time   COLORURINE AMBER (A) 12/31/2023 1843   APPEARANCEUR CLOUDY (A) 12/31/2023 1843   LABSPEC 1.015 12/31/2023 1843   PHURINE 8.0  12/31/2023 1843   GLUCOSEU NEGATIVE 12/31/2023 1843   HGBUR SMALL (A) 12/31/2023 1843   BILIRUBINUR NEGATIVE 12/31/2023 1843   KETONESUR NEGATIVE 12/31/2023 1843   PROTEINUR 100 (A) 12/31/2023 1843   UROBILINOGEN 2.0 (H) 08/06/2015 1209   NITRITE NEGATIVE 12/31/2023 1843   LEUKOCYTESUR NEGATIVE  12/31/2023 1843    Radiological Exams on Admission: MR BRAIN WO CONTRAST Result Date: 01/01/2024 CLINICAL DATA:  LLE weakness EXAM: MRI HEAD WITHOUT CONTRAST TECHNIQUE: Multiplanar, multiecho pulse sequences of the brain and surrounding structures were obtained without intravenous contrast. COMPARISON:  CT head 12/02/2023. FINDINGS: Brain: No acute infarction, hemorrhage, hydrocephalus, extra-axial collection or mass lesion. Remote left frontal infarct. Vascular: Major arterial flow voids are maintained at the skull base. Skull and upper cervical spine: Normal marrow signal. Sinuses/Orbits: Clear sinuses.  No acute orbital findings. IMPRESSION: 1. No evidence of acute intracranial abnormality. 2. Remote left frontal infarct. Electronically Signed   By: Gilmore GORMAN Molt M.D.   On: 01/01/2024 02:00   DG Knee Complete 4 Views Left Result Date: 12/31/2023 CLINICAL DATA:  Clemens, pain, limited range of motion EXAM: LEFT KNEE - COMPLETE 4+ VIEW COMPARISON:  None Available. FINDINGS: Frontal and lateral views of the left knee are obtained. No acute fracture, subluxation, or dislocation. Joint spaces are well preserved. No joint effusion. Atherosclerosis. Soft tissues are otherwise unremarkable. IMPRESSION: 1. No acute bony abnormality. Electronically Signed   By: Ozell Daring M.D.   On: 12/31/2023 16:38   DG Knee Complete 4 Views Right Result Date: 12/31/2023 CLINICAL DATA:  Fall, limited range of motion RIGHT KNEE - COMPLETE 4+ VIEW COMPARISON:  None Available. FINDINGS: No evidence of fracture, dislocation, or joint effusion. No evidence of arthropathy or other focal bone abnormality. Patchy femoral-popliteal  arterial calcifications. Soft tissues are unremarkable. IMPRESSION: Negative. Electronically Signed   By: JONETTA Faes M.D.   On: 12/31/2023 16:20   DG Ribs Unilateral W/Chest Left Result Date: 12/31/2023 CLINICAL DATA:  Clemens, left rib and abdominal pain EXAM: LEFT RIBS AND CHEST - 3+ VIEW COMPARISON:  12/02/2023 FINDINGS: Frontal view of the chest as well as frontal and oblique views of the left thoracic cage are obtained. Stable enlargement of the cardiac silhouette. No airspace disease, effusion, or pneumothorax. No acute displaced fractures. IMPRESSION: 1. No acute intrathoracic process. 2. No displaced rib fracture. Electronically Signed   By: Ozell Daring M.D.   On: 12/31/2023 16:19      Assessment/Plan    Principal Problem:   Generalized weakness Active Problems:   DM2 (diabetes mellitus, type 2) (HCC)   BPH (benign prostatic hyperplasia)   Essential hypertension   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HCC)   Prolonged QT interval   Acute cystitis   Fall at home, initial encounter     #) Generalized weakness: 2 to 3 days duration of generalized weakness, in the absence of any evidence of acute focal neurologic deficits, noting the chronicity of his left lower extremity weakness as a consequence of prior ischemic stroke. uspect contribution from physiologic stress stemming from presenting acute cystitis.    Will further eval for any additional contributions from endocrine/metabolic sources, as detailed below.   Plan: work-up and management of presenting acute cystitis, as described below. PT/OT consults ordered for the AM. Fall precautions. CMP/CBC in the AM. Check TSH, serum Mg level. Check B12 level.SABRA                     #) Ground level mechanical fall: The patient reports a ground-level mechanical fall that occurred earlier today, in the absence of hitting his head in the absence of any associated loss of consciousness.  Appears to be as a  consequence of 2 to 3 days of generalized weakness superimposed on his chronic left lower extremity weakness.  No evidence  of acute traumatic injury on presenting imaging.,  As further detailed above.  Plan: Fall precautions. Repeat CMP and CBC with differential in the morning.  PT/OT consultsordered.  Further evaluation management of presenting acute cystitis, as detailed below.                #) acute cystitis: In setting of 2 days of new onset dysuria, generalized weakness, fall, his presenting urinalysis is suspicious for UTI in setting of cloudy appearing specimen with significant pyuria, many bacteria, and no evidence of squamous epithelial cells to suggest a contaminated specimen.  He has mildly elevated white blood cell count that does not meet quantitative threshold for inclusion in SIRS criteria.  Additionally, he has mildly elevated temperature of 99.7 in the ED.  However, in the absence of objective fever in the absence of white cell count greater than 12,000, SIRS criteria are not met for sepsis at this time.  Urine culture was collected and the patient received a single dose of fosfomycin, which should provide adequate coverage for his uti.   Plan: Follow-up result of urine culture.  Repeat CBC in the morning.                   #) QTc prolongation: Presenting EKG demonstrates QTc of 523 ms. notable in the context of his history of hocm.  Plan: Monitor on telemetry.  Add-on serum magnesium  level.  Repeat EKG in the morning to monitor interval degree of QTc prolongation.                   #) Type 2 Diabetes Mellitus: documented history of such. Home insulin  regimen: None. Home oral hypoglycemic agents: Metformin . presenting blood sugar: 95. Most recent A1c noted to be 6.8% when checked on 11/21/2023.  Plan: accuchecks QAC and HS with low dose SSI.  Hold home metformin  for now.                    #) Chronic heart failure with  preserved EF in the setting of HOCM: Manage history of such, with most recent echocardiogram occurring in March 2024, which showed findings consistent with HOCM physiology, while also noting LVEF 77%, normal diastolic parameters, normal right ventricular systolic function, mild mitral gravitation and mild to moderate aortic regurgitation.  No clinical or radiographic evidence to suggest acute volume overload at this time.  Of note, he is on Coreg  and verapamil  as an outpatient.  Will hold next doses of these medications in the setting of bradycardia in the ED.  Plan: Monitor strict I's and O's and daily weights.  Add on serum magnesium  level.  Hold home Coreg , verapamil  for now.  Monitor on symmetry.                   #) Essential Hypertension: documented h/o such, with outpatient antihypertensive regimen including verapamil , Coreg , hydralazine , Flomax .  SBP's in the ED today: 170s to low 190s mmHg. of note, in the setting of bradycardia in the ED, will hold next doses of Coreg  and verapamil .  Plan: Close monitoring of subsequent BP via routine VS. resume home hydralazine .  Resume home Flomax , which may improve heart rate due to reflex tachycardia.  Hold home Coreg  and verapamil  for now, as above.  Monitor on telemetry.  As needed IV hydralazine .                   #) Benign Prostatic Hyperplasia:  documented h/o such; on tamsulosin  as  outpatient.   Plan: monitor strict I's & O's and daily weights. Repeat CMP in AM.  continue home tamsulosin .     DVT prophylaxis: SCD's   Code Status: Full code Family Communication: none Disposition Plan: Per Rounding Team Consults called: none;  Admission status: Inpatient    I SPENT GREATER THAN 75  MINUTES IN CLINICAL CARE TIME/MEDICAL DECISION-MAKING IN COMPLETING THIS ADMISSION.     Trason Shifflet B Bridie Colquhoun DO [facility] From 7PM - 7AM   01/01/2024, 4:17 AM

## 2024-01-01 NOTE — Care Management CC44 (Signed)
 Condition Code 44 Documentation Completed  Patient Details  Name: [name] MRN: [id number] Date of Birth: 1951-02-18   Condition Code 44 given:  Yes Patient signature on Condition Code 44 notice:  Yes Documentation of 2 MD's agreement:  Yes Code 44 added to claim:  Yes    Stefan JONELLE Cory, RN 01/01/2024, 1:22 PM

## 2024-01-02 DIAGNOSIS — R531 Weakness: Secondary | ICD-10-CM | POA: Diagnosis not present

## 2024-01-02 LAB — GLUCOSE, CAPILLARY
Glucose-Capillary: 138 mg/dL — ABNORMAL HIGH (ref 70–99)
Glucose-Capillary: 161 mg/dL — ABNORMAL HIGH (ref 70–99)

## 2024-01-02 LAB — URINE CULTURE: Culture: >=100000 — AB

## 2024-01-02 MED ORDER — METHOCARBAMOL 500 MG PO TABS
500.0000 mg | ORAL_TABLET | Freq: Once | ORAL | Status: AC
  Administered 2024-01-02: 500 mg via ORAL
  Filled 2024-01-02: qty 1

## 2024-01-02 MED ORDER — SULFAMETHOXAZOLE-TRIMETHOPRIM 800-160 MG PO TABS: 1.0000 | ORAL_TABLET | Freq: Two times a day (BID) | ORAL | 0 refills | Status: AC

## 2024-01-02 NOTE — Progress Notes (Signed)
 OT Cancellation Note  Patient Details Name: [name] MRN: [id number] DOB: 1951/04/03   Cancelled Treatment:    Reason Eval/Treat Not Completed: Patient declined, no reason specified Patient reported  I am not ready for all that. OT educated on OT role in continuum of care and importance of getting out of bed. Patient reported he gets out of bed just fine. OT to continue to follow and check back as schedule will allow.  Geofm LEYLAND, MS Acute Rehabilitation Department Office# [phone]  01/02/2024, 8:21 AM

## 2024-01-02 NOTE — Evaluation (Signed)
 Physical Therapy Evaluation Patient Details Name: [name] MRN: [id number] DOB: June 08, 1951 Today's Date: 01/02/2024  History of Present Illness  patient is a 73 year old man with PMH of essential HTN, TIA/stroke, DM2, chronic LLE weakness, HTN who was admitted with generalized weakness earlier today after mechanical fall from ground-level.  Patient was thought to have UTI and was admitted for evaluation of his weakness and treatment of UTI.  Clinical Impression  Pt admitted with above diagnosis.  Pt currently with functional limitations due to the deficits listed below (see PT Problem List). Pt will benefit from acute skilled PT to increase their independence and safety with mobility to allow discharge.  PT evaluation was limited as patient had just gotten back into bed from walking to bathroom with nurse.  He was agreeable to get to recliner to eat his breakfast.  Transfer was unsteady with what appeared to be ataxic steps, although limited in distance, so will assess further at next visit.  Difficulty with fine motor tasks with breaksfast tray.  Niece in room at end of session saying that patient does not want rehab and that the family can provide 24 hour care.  Do think pt could benefit, but since he is refusing, recommend home with 24 hour support and HHPT.         If plan is discharge home, recommend the following: A lot of help with walking and/or transfers;Help with stairs or ramp for entrance;Assist for transportation;Assistance with cooking/housework   Can travel by private vehicle        Equipment Recommendations None recommended by PT  Recommendations for Other Services       Functional Status Assessment Patient has had a recent decline in their functional status and demonstrates the ability to make significant improvements in function in a reasonable and predictable amount of time.     Precautions / Restrictions Precautions Precautions: Fall Restrictions Weight Bearing  Restrictions Per Provider Order: No      Mobility  Bed Mobility Overal bed mobility: Needs Assistance Bed Mobility: Supine to Sit     Supine to sit: Supervision     General bed mobility comments: S for safety    Transfers Overall transfer level: Needs assistance Equipment used: Rollator (4 wheels) Transfers: Bed to chair/wheelchair/BSC, Sit to/from Stand Sit to Stand: Contact guard assist   Step pivot transfers: Min assist       General transfer comment: Amb a few small steps from bed to recliner with ataxic/decreased proprioception in LE, but he denied having difficulty feeling where legs/feet are in space.    Ambulation/Gait               General Gait Details: Had just walked back from bathroomwith nurse and only agreeable to get to recliner for breakfast. She reports he was wobbly.  Stairs            Wheelchair Mobility     Tilt Bed    Modified Rankin (Stroke Patients Only)       Balance Overall balance assessment: Needs assistance Sitting-balance support: Feet supported Sitting balance-Leahy Scale: Fair Sitting balance - Comments: leaning forward heavily   Standing balance support: Bilateral upper extremity supported Standing balance-Leahy Scale: Poor Standing balance comment: requires UE support of RW for balance                             Pertinent Vitals/Pain Pain Assessment Pain Assessment: 0-10 Pain Score: 0-No pain  Home Living Family/patient expects to be discharged to:: Private residence Living Arrangements: Alone Available Help at Discharge: Family;Available 24 hours/day Type of Home: House Home Access: Level entry       Home Layout: One level Home Equipment: Agricultural Consultant (2 wheels)      Prior Function Prior Level of Function : Independent/Modified Independent             Mobility Comments: Reports not using any DME prior to admission       Extremity/Trunk Assessment   Upper Extremity  Assessment Upper Extremity Assessment: Defer to OT evaluation (noted difficulty with fine motor tasks with breakfast tray)    Lower Extremity Assessment Lower Extremity Assessment: Generalized weakness    Cervical / Trunk Assessment Cervical / Trunk Assessment: Kyphotic  Communication   Communication Communication: No apparent difficulties  Cognition Arousal: Alert Behavior During Therapy: WFL for tasks assessed/performed, Flat affect Overall Cognitive Status: Within Functional Limits for tasks assessed                                 General Comments: Oriented to person, place, year and situation        General Comments      Exercises     Assessment/Plan    PT Assessment Patient needs continued PT services  PT Problem List Decreased strength;Decreased activity tolerance;Decreased balance;Decreased mobility       PT Treatment Interventions Gait training;Functional mobility training;Therapeutic activities;Therapeutic exercise;Balance training    PT Goals (Current goals can be found in the Care Plan section)  Acute Rehab PT Goals Patient Stated Goal: home today PT Goal Formulation: With patient/family Time For Goal Achievement: 01/16/24 Potential to Achieve Goals: Good    Frequency Min 1X/week     Co-evaluation               AM-PAC PT 6 Clicks Mobility  Outcome Measure Help needed turning from your back to your side while in a flat bed without using bedrails?: None Help needed moving from lying on your back to sitting on the side of a flat bed without using bedrails?: None Help needed moving to and from a bed to a chair (including a wheelchair)?: A Little Help needed standing up from a chair using your arms (e.g., wheelchair or bedside chair)?: A Little Help needed to walk in hospital room?: A Little Help needed climbing 3-5 steps with a railing? : A Lot 6 Click Score: 19    End of Session Equipment Utilized During Treatment: Gait  belt Activity Tolerance: Patient tolerated treatment well Patient left: in chair;with call bell/phone within reach;with chair alarm set;with family/visitor present Nurse Communication: Mobility status PT Visit Diagnosis: Unsteadiness on feet (R26.81);Muscle weakness (generalized) (M62.81);Difficulty in walking, not elsewhere classified (R26.2)    Time: 9074-9053 PT Time Calculation (min) (ACUTE ONLY): 21 min   Charges:   PT Evaluation $PT Eval Moderate Complexity: 1 Mod   PT General Charges $$ ACUTE PT VISIT: 1 Visit         Darice RAMAN., PT Office [phone] Acute Rehab 01/02/2024   Darice LITTIE Sharps 01/02/2024, 10:01 AM

## 2024-01-03 NOTE — Discharge Summary (Signed)
 [name] FMW:[id number] DOB: 12/15/1951 DOA: 12/31/2023  PCP: Shelda Atlas, MD  Admit date: 12/31/2023  Discharge date: 01/03/2024  Admitted From: Home   disposition: Home   Recommendations for Outpatient Follow-up:   Follow up with PCP in 1-2 weeks  Home Health: N/A Equipment/Devices: N/A Consultations: None Discharge Condition: Improved CODE STATUS: Full  Diet Order             Diet Carb Modified                    Chief Complaint  Patient presents with   Fall   Abdominal Pain   Rib Injury     Brief history of present illness from the day of admission and additional interim summary     [name] is a 73 y.o. 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   The patient reports that he tripped while attempting to ambulate earlier in the day resulting in a ground-level fall in which the bilateral knees were the principal point of contact with the ground.  He conveys that he did not hit his head as a component of this fall, and denies any associated loss consciousness.  No associated acute headache or neck pain as a consequence of this sequence.  He reports mild discomfort over the anterior aspect of the bilateral knees that started with impact of the knees with the ground.  He is on a daily baby aspirin , but otherwise on no additional blood thinners as an outpatient.   He states that this occurred in the setting of 2 3 days of generalized weakness in the absence of any acute focal weakness.  He conveys that he has chronic left lower extremity  weakness in the setting of a history of ischemic CVA.  He does not feel that the left lower extremity weakness is any worse than his baseline at this time.  Denies any new acute focal weakness, nor any acute focal numbness, paresthesias.   Over the last 2 days, is also noted some new onset dysuria.  He has a history of recurrent urinary tract infections, and conveys that the above presentation is similar to that which she has experienced at times of previous urinary tract infections.  Denies associated subjective fever, chills or rigors, generalized myalgias.  No recent chest pain or shortness of breath.                                                                    Hospital Course    Patient improved with dose of fosfomycin and  fluid resuscitation.  Day after admission patient was feeling much better and he and his family were requesting to go home.  Urine culture results had returned and given multiple antibiotic resistances, patient was discharged home on Bactrim  to complete a 7-day course for UTI.  Patient to follow-up with PCP for repeat EKG as he was noted to have an elevated QTc despite normal potassium and magnesium .  This was thought to be related to his hypertrophic cardiomyopathy.   Discharge diagnosis     Principal Problem:   Generalized weakness Active Problems:   UTI (urinary tract infection)   DM2 (diabetes mellitus, type 2) (HCC)   BPH (benign prostatic hyperplasia)   Essential hypertension   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HCC)   Prolonged QT interval   Acute cystitis   Fall    Discharge instructions    Discharge Instructions     Diet Carb Modified   Complete by: As directed    Discharge instructions   Complete by: As directed    Take the antibiotics twice a day for 7 days, do no stop taking early because the bacteria might get more resistant and harder to treat. Drink plenty of water, stay well hydrated. See your primary doctor in 7-10 to make  sure you are still feeling well.   Increase activity slowly   Complete by: As directed        Discharge Medications   Allergies as of 01/02/2024   No Known Allergies      Medication List     STOP taking these medications    cephALEXin  500 MG capsule Commonly known as: KEFLEX        TAKE these medications    albuterol  108 (90 Base) MCG/ACT inhaler Commonly known as: VENTOLIN  HFA Inhale 1-2 puffs into the lungs every 6 (six) hours as needed for wheezing.   aspirin  EC 81 MG tablet Take 81 mg by mouth every morning.   atorvastatin  40 MG tablet Commonly known as: LIPITOR Take 1 tablet (40 mg total) by mouth daily.   carvedilol  25 MG tablet Commonly known as: COREG  Take 1 tablet (25 mg total) by mouth 2 (two) times daily with a meal.   diclofenac  Sodium 1 % Gel Commonly known as: VOLTAREN  Apply 2 g topically 4 (four) times daily.   gabapentin  600 MG tablet Commonly known as: NEURONTIN  Take 600 mg by mouth 3 (three) times daily.   hydrALAZINE  100 MG tablet Commonly known as: APRESOLINE  Take 1 tablet (100 mg total) by mouth every 8 (eight) hours.   ibuprofen  800 MG tablet Commonly known as: ADVIL  Take 800 mg by mouth 3 (three) times daily as needed.   lidocaine  5 % Commonly known as: LIDODERM  daily as needed.   metFORMIN  500 MG tablet Commonly known as: GLUCOPHAGE  Take 1 tablet (500 mg total) by mouth 2 (two) times daily.   nicotine  21 mg/24hr patch Commonly known as: NICODERM CQ  - dosed in mg/24 hours Place 1 patch (21 mg total) onto the skin daily. What changed:  when to take this reasons to take this   omeprazole  20 MG capsule Commonly known as: PRILOSEC Take 1 capsule (20 mg total) by mouth daily.   ondansetron  8 MG disintegrating tablet Commonly known as: ZOFRAN -ODT Take 1 tablet (8 mg total) by mouth every 8 (eight) hours as needed for nausea or vomiting.   sulfamethoxazole -trimethoprim  800-160 MG tablet Commonly known as: Bactrim  DS Take  1 tablet by mouth 2 (two) times daily for 7 days.  tamsulosin  0.4 MG Caps capsule Commonly known as: FLOMAX  Take 1 capsule (0.4 mg total) by mouth daily after supper.   traMADol  50 MG tablet Commonly known as: ULTRAM  Take 1 tablet by mouth twice a day as needed for severe pains   verapamil  180 MG CR tablet Commonly known as: CALAN -SR Take 2 tablets (360 mg total) by mouth daily.          Major procedures and Radiology Reports - PLEASE review detailed and final reports thoroughly  -        MR BRAIN WO CONTRAST Result Date: 01/01/2024 CLINICAL DATA:  LLE weakness EXAM: MRI HEAD WITHOUT CONTRAST TECHNIQUE: Multiplanar, multiecho pulse sequences of the brain and surrounding structures were obtained without intravenous contrast. COMPARISON:  CT head 12/02/2023. FINDINGS: Brain: No acute infarction, hemorrhage, hydrocephalus, extra-axial collection or mass lesion. Remote left frontal infarct. Vascular: Major arterial flow voids are maintained at the skull base. Skull and upper cervical spine: Normal marrow signal. Sinuses/Orbits: Clear sinuses.  No acute orbital findings. IMPRESSION: 1. No evidence of acute intracranial abnormality. 2. Remote left frontal infarct. Electronically Signed   By: Gilmore GORMAN Molt M.D.   On: 01/01/2024 02:00   DG Knee Complete 4 Views Left Result Date: 12/31/2023 CLINICAL DATA:  Clemens, pain, limited range of motion EXAM: LEFT KNEE - COMPLETE 4+ VIEW COMPARISON:  None Available. FINDINGS: Frontal and lateral views of the left knee are obtained. No acute fracture, subluxation, or dislocation. Joint spaces are well preserved. No joint effusion. Atherosclerosis. Soft tissues are otherwise unremarkable. IMPRESSION: 1. No acute bony abnormality. Electronically Signed   By: Ozell Daring M.D.   On: 12/31/2023 16:38   DG Knee Complete 4 Views Right Result Date: 12/31/2023 CLINICAL DATA:  Fall, limited range of motion RIGHT KNEE - COMPLETE 4+ VIEW COMPARISON:  None  Available. FINDINGS: No evidence of fracture, dislocation, or joint effusion. No evidence of arthropathy or other focal bone abnormality. Patchy femoral-popliteal arterial calcifications. Soft tissues are unremarkable. IMPRESSION: Negative. Electronically Signed   By: JONETTA Faes M.D.   On: 12/31/2023 16:20   DG Ribs Unilateral W/Chest Left Result Date: 12/31/2023 CLINICAL DATA:  Clemens, left rib and abdominal pain EXAM: LEFT RIBS AND CHEST - 3+ VIEW COMPARISON:  12/02/2023 FINDINGS: Frontal view of the chest as well as frontal and oblique views of the left thoracic cage are obtained. Stable enlargement of the cardiac silhouette. No airspace disease, effusion, or pneumothorax. No acute displaced fractures. IMPRESSION: 1. No acute intrathoracic process. 2. No displaced rib fracture. Electronically Signed   By: Ozell Daring M.D.   On: 12/31/2023 16:19    Micro Results    Recent Results (from the past 240 hours)  Urine Culture     Status: Abnormal   Collection Time: 12/31/23  6:43 PM   Specimen: Urine, Clean Catch  Result Value Ref Range Status   Specimen Description   Final    URINE, CLEAN CATCH Performed at [facility], [address] [place], [place] [address]    Special Requests   Final    NONE Performed at [facility], [address] [place], [place] [address]    Culture >=100,000 COLONIES/mL ESCHERICHIA COLI (A)  Final   Report Status 01/02/2024 FINAL  Final   Organism ID, Bacteria ESCHERICHIA COLI (A)  Final      Susceptibility   Escherichia coli - MIC*    AMPICILLIN  >=32 RESISTANT Resistant     CEFAZOLIN >=64 RESISTANT Resistant  CEFEPIME  1 SENSITIVE Sensitive     CEFTRIAXONE  32 RESISTANT Resistant     CIPROFLOXACIN  >=4 RESISTANT Resistant     GENTAMICIN <=1 SENSITIVE Sensitive     IMIPENEM <=0.25 SENSITIVE Sensitive     NITROFURANTOIN  <=16 SENSITIVE Sensitive     TRIMETH /SULFA  <=20 SENSITIVE Sensitive     AMPICILLIN /SULBACTAM >=32  RESISTANT Resistant     PIP/TAZO >=128 RESISTANT Resistant ug/mL    * >=100,000 COLONIES/mL ESCHERICHIA COLI    Today   Subjective    Burak Bowditch feels much improved since admission.  Feels ready to go home.  Denies chest pain, shortness of breath or abdominal pain.  Feels they can take care of themselves with the resources they have at home.  Objective   Blood pressure (!) 175/55, pulse 77, temperature 99.2 F (37.3 C), temperature source Oral, resp. rate 18, height 5' 9 (1.753 m), weight 72.6 kg, SpO2 96%.  No intake or output data in the 24 hours ending 01/03/24 1738  Exam General: Patient appears well and in good spirits sitting up in bed in no acute distress.  Eyes: sclera anicteric, conjuctiva mild injection bilaterally CVS: S1-S2, regular  Respiratory:  decreased air entry bilaterally secondary to decreased inspiratory effort, rales at bases  GI: NABS, soft, NT  LE: No edema.  Neuro: A/O x 3, Moving all extremities equally with normal strength, CN 3-12 intact, grossly nonfocal.  Psych: patient is logical and coherent, judgement and insight appear normal, mood and affect appropriate to situation.    Data Review   CBC w Diff:  Lab Results  Component Value Date   WBC 10.0 01/01/2024   HGB 11.4 (L) 01/01/2024   HCT 37.7 (L) 01/01/2024   PLT 243 01/01/2024   LYMPHOPCT 32 01/01/2024   BANDSPCT 2 11/20/2008   MONOPCT 8 01/01/2024   EOSPCT 4 01/01/2024   BASOPCT 1 01/01/2024    CMP:  Lab Results  Component Value Date   NA 143 01/01/2024   K 3.6 01/01/2024   CL 108 01/01/2024   CO2 26 01/01/2024   BUN 25 (H) 01/01/2024   CREATININE 0.79 01/01/2024   CREATININE 0.96 06/04/2021   PROT 6.9 01/01/2024   ALBUMIN 3.3 (L) 01/01/2024   BILITOT 0.6 01/01/2024   ALKPHOS 52 01/01/2024   AST 24 01/01/2024   ALT 19 01/01/2024  .   Total Time in preparing paper work, data evaluation and todays exam - 35 minutes  Brentley Landfair Tublu Jasyn Mey M.D on 01/03/2024 at 5:38  PM  [facility]

## 2024-01-31 ENCOUNTER — Emergency Department (HOSPITAL_COMMUNITY)
Admission: EM | Admit: 2024-01-31 | Discharge: 2024-01-31 | Disposition: A | Discharge status: Home / Self Care | Payer: Medicaid Other | Attending: Emergency Medicine | Admitting: Emergency Medicine

## 2024-01-31 ENCOUNTER — Emergency Department (HOSPITAL_COMMUNITY): Payer: Medicaid Other

## 2024-01-31 ENCOUNTER — Encounter (HOSPITAL_COMMUNITY): Payer: Self-pay

## 2024-01-31 ENCOUNTER — Other Ambulatory Visit: Payer: Self-pay

## 2024-01-31 DIAGNOSIS — Z7982 Long term (current) use of aspirin: Secondary | ICD-10-CM | POA: Insufficient documentation

## 2024-01-31 DIAGNOSIS — R531 Weakness: Secondary | ICD-10-CM | POA: Diagnosis present

## 2024-01-31 DIAGNOSIS — R202 Paresthesia of skin: Secondary | ICD-10-CM | POA: Insufficient documentation

## 2024-01-31 DIAGNOSIS — R0789 Other chest pain: Secondary | ICD-10-CM | POA: Diagnosis not present

## 2024-01-31 DIAGNOSIS — R0602 Shortness of breath: Secondary | ICD-10-CM | POA: Diagnosis not present

## 2024-01-31 DIAGNOSIS — Z1152 Encounter for screening for COVID-19: Secondary | ICD-10-CM | POA: Diagnosis not present

## 2024-01-31 LAB — CBC WITH DIFFERENTIAL/PLATELET
Abs Immature Granulocytes: 0.06 10*3/uL (ref 0.00–0.07)
Basophils Absolute: 0.1 10*3/uL (ref 0.0–0.1)
Basophils Relative: 1 %
Eosinophils Absolute: 0.4 10*3/uL (ref 0.0–0.5)
Eosinophils Relative: 3 %
HCT: 42 % (ref 39.0–52.0)
Hemoglobin: 13 g/dL (ref 13.0–17.0)
Immature Granulocytes: 0 %
Lymphocytes Relative: 27 %
Lymphs Abs: 3.7 10*3/uL (ref 0.7–4.0)
MCH: 23.3 pg — ABNORMAL LOW (ref 26.0–34.0)
MCHC: 31 g/dL (ref 30.0–36.0)
MCV: 75.3 fL — ABNORMAL LOW (ref 80.0–100.0)
Monocytes Absolute: 0.8 10*3/uL (ref 0.1–1.0)
Monocytes Relative: 6 %
Neutro Abs: 8.8 10*3/uL — ABNORMAL HIGH (ref 1.7–7.7)
Neutrophils Relative %: 63 %
Platelets: 270 10*3/uL (ref 150–400)
RBC: 5.58 MIL/uL (ref 4.22–5.81)
RDW: 17.1 % — ABNORMAL HIGH (ref 11.5–15.5)
WBC: 13.7 10*3/uL — ABNORMAL HIGH (ref 4.0–10.5)
nRBC: 0 % (ref 0.0–0.2)

## 2024-01-31 LAB — COMPREHENSIVE METABOLIC PANEL
ALT: 29 U/L (ref 0–44)
AST: 40 U/L (ref 15–41)
Albumin: 3.8 g/dL (ref 3.5–5.0)
Alkaline Phosphatase: 55 U/L (ref 38–126)
Anion gap: 12 (ref 5–15)
BUN: 14 mg/dL (ref 8–23)
CO2: 18 mmol/L — ABNORMAL LOW (ref 22–32)
Calcium: 9.2 mg/dL (ref 8.9–10.3)
Chloride: 108 mmol/L (ref 98–111)
Creatinine, Ser: 1.04 mg/dL (ref 0.61–1.24)
GFR, Estimated: >60 mL/min (ref >60)
Glucose, Bld: 193 mg/dL — ABNORMAL HIGH (ref 70–99)
Potassium: 4.4 mmol/L (ref 3.5–5.1)
Sodium: 138 mmol/L (ref 135–145)
Total Bilirubin: 0.8 mg/dL (ref 0.0–1.2)
Total Protein: 7.5 g/dL (ref 6.5–8.1)

## 2024-01-31 LAB — RESP PANEL BY RT-PCR (RSV, FLU A&B, COVID)  RVPGX2
Influenza A by PCR: NEGATIVE
Influenza B by PCR: NEGATIVE
Resp Syncytial Virus by PCR: NEGATIVE
SARS Coronavirus 2 by RT PCR: NEGATIVE

## 2024-01-31 LAB — BRAIN NATRIURETIC PEPTIDE: B Natriuretic Peptide: 81.8 pg/mL (ref 0.0–100.0)

## 2024-01-31 LAB — TROPONIN I (HIGH SENSITIVITY)
Troponin I (High Sensitivity): 21 ng/L — ABNORMAL HIGH (ref <18)
Troponin I (High Sensitivity): 26 ng/L — ABNORMAL HIGH (ref <18)

## 2024-01-31 MED ORDER — ENSURE ENLIVE PO LIQD
237.0000 mL | Freq: Once | ORAL | Status: AC
  Administered 2024-01-31: 237 mL via ORAL
  Filled 2024-01-31: qty 237

## 2024-01-31 MED ORDER — LIDOCAINE 5 % EX PTCH: 1.0000 | MEDICATED_PATCH | CUTANEOUS | 0 refills | Status: AC

## 2024-01-31 NOTE — ED Provider Notes (Signed)
[place] EMERGENCY DEPARTMENT AT [facility] Provider Note   CSN: [id number] Arrival date & time: 01/31/24  1022     History  Chief Complaint  Patient presents with   Weakness    [name] is a 73 y.o. male.   Weakness    Pt states he has been having trouble with numbness in his arms and legs for several weeks.  Pt states it has been there since he was in the hospital last month.  No falls or injuries since leaving the hospital.  Patient states he has been able to walk but he has been using a walker.  He denies any trouble with his speech or his vision. Patient also states he has been having some sharp pain on the left side of his chest.  It does hurt for him to press on the left chest wall.  He does have some shortness of breath but denies any trouble with fevers or chills.  No coughing.  No leg swelling.   Home Medications Prior to Admission medications   Medication Sig Start Date End Date Taking? Authorizing Provider  lidocaine (LIDODERM) 5 % Place 1 patch onto the skin daily. Remove & Discard patch within 12 hours or as directed by MD 01/31/24  Yes Linwood Dibbles, MD  albuterol (PROVENTIL HFA;VENTOLIN HFA) 108 (90 Base) MCG/ACT inhaler Inhale 1-2 puffs into the lungs every 6 (six) hours as needed for wheezing. 09/04/16   Rolland Porter, MD  aspirin EC 81 MG tablet Take 81 mg by mouth every morning.    [provider]  atorvastatin (LIPITOR) 40 MG tablet Take 1 tablet (40 mg total) by mouth daily. 11/12/23   Danford, Earl Lites, MD  carvedilol (COREG) 25 MG tablet Take 1 tablet (25 mg total) by mouth 2 (two) times daily with a meal. 12/04/23   Tyrone Nine, MD  diclofenac Sodium (VOLTAREN) 1 % GEL Apply 2 g topically 4 (four) times daily. 11/12/23   Danford, Earl Lites, MD  gabapentin (NEURONTIN) 600 MG tablet Take 600 mg by mouth 3 (three) times daily. 12/08/23   [provider]  hydrALAZINE (APRESOLINE) 100 MG tablet Take 1 tablet (100 mg total)  by mouth every 8 (eight) hours. 12/04/23   Tyrone Nine, MD  ibuprofen (ADVIL) 800 MG tablet Take 800 mg by mouth 3 (three) times daily as needed. 12/17/23   [provider]  metFORMIN (GLUCOPHAGE) 500 MG tablet Take 1 tablet (500 mg total) by mouth 2 (two) times daily. 11/12/23   Danford, Earl Lites, MD  nicotine (NICODERM CQ - DOSED IN MG/24 HOURS) 21 mg/24hr patch Place 1 patch (21 mg total) onto the skin daily. Patient taking differently: Place 21 mg onto the skin daily as needed (smoking cessation). 02/21/20   Rodolph Bong, MD  omeprazole (PRILOSEC) 20 MG capsule Take 1 capsule (20 mg total) by mouth daily. 12/14/22   Arby Barrette, MD  ondansetron (ZOFRAN-ODT) 8 MG disintegrating tablet Take 1 tablet (8 mg total) by mouth every 8 (eight) hours as needed for nausea or vomiting. 08/19/22   Margarita Grizzle, MD  tamsulosin (FLOMAX) 0.4 MG CAPS capsule Take 1 capsule (0.4 mg total) by mouth daily after supper. 11/12/23   Danford, Earl Lites, MD  traMADol (ULTRAM) 50 MG tablet Take 1 tablet by mouth twice a day as needed for severe pains 06/04/21     verapamil (CALAN-SR) 180 MG CR tablet Take 2 tablets (360 mg total) by mouth daily. 11/12/23  Danford, Earl Lites, MD      Allergies    Patient has no known allergies.    Review of Systems   Review of Systems  Neurological:  Positive for weakness.    Physical Exam Updated Vital Signs BP (!) 164/57   Pulse 71   Temp 98.1 F (36.7 C) (Oral)   Resp 16   Ht 1.753 m (5\' 9" )   Wt 72.6 kg   SpO2 98%   BMI 23.63 kg/m  Physical Exam Vitals and nursing note reviewed.  Constitutional:      General: He is not in acute distress.    Appearance: He is well-developed.  HENT:     Head: Normocephalic and atraumatic.     Right Ear: External ear normal.     Left Ear: External ear normal.  Eyes:     General: No scleral icterus.       Right eye: No discharge.        Left eye: No discharge.     Conjunctiva/sclera: Conjunctivae  normal.  Neck:     Trachea: No tracheal deviation.  Cardiovascular:     Rate and Rhythm: Normal rate and regular rhythm.  Pulmonary:     Effort: Pulmonary effort is normal. No respiratory distress.     Breath sounds: Normal breath sounds. No stridor. No wheezing or rales.  Chest:     Chest wall: Tenderness present.     Comments: Ttp left chest wall Abdominal:     General: Bowel sounds are normal. There is no distension.     Palpations: Abdomen is soft.     Tenderness: There is no abdominal tenderness. There is no guarding or rebound.  Musculoskeletal:        General: No tenderness or deformity.     Cervical back: Neck supple.     Comments: No midline cervical spine tenderness  Skin:    General: Skin is warm and dry.     Findings: No rash.  Neurological:     General: No focal deficit present.     Mental Status: He is alert.     Cranial Nerves: No cranial nerve deficit, dysarthria or facial asymmetry.     Sensory: No sensory deficit.     Motor: No abnormal muscle tone or seizure activity.     Coordination: Coordination normal.  Psychiatric:        Mood and Affect: Mood normal.     ED Results / Procedures / Treatments   Labs (all labs ordered are listed, but only abnormal results are displayed) Labs Reviewed  CBC WITH DIFFERENTIAL/PLATELET - Abnormal; Notable for the following components:      Result Value   WBC 13.7 (*)    MCV 75.3 (*)    MCH 23.3 (*)    RDW 17.1 (*)    Neutro Abs 8.8 (*)    All other components within normal limits  COMPREHENSIVE METABOLIC PANEL - Abnormal; Notable for the following components:   CO2 18 (*)    Glucose, Bld 193 (*)    All other components within normal limits  TROPONIN I (HIGH SENSITIVITY) - Abnormal; Notable for the following components:   Troponin I (High Sensitivity) 21 (*)    All other components within normal limits  TROPONIN I (HIGH SENSITIVITY) - Abnormal; Notable for the following components:   Troponin I (High Sensitivity)  26 (*)    All other components within normal limits  RESP PANEL BY RT-PCR (RSV, FLU A&B, COVID)  [id number]  BRAIN NATRIURETIC PEPTIDE    EKG EKG Interpretation Date/Time:  Sunday January 31 2024 10:33:18 EST Ventricular Rate:  89 PR Interval:  177 QRS Duration:  91 QT Interval:  395 QTC Calculation: 481 R Axis:   -9  Text Interpretation: Sinus rhythm Probable left atrial enlargement Nonspecific repol abnormality, lateral leads , improved since last tracing Confirmed by Linwood Dibbles [phone]) on 01/31/2024 11:33:46 AM  Radiology CT Cervical Spine Wo Contrast Result Date: 01/31/2024 CLINICAL DATA:  Neck trauma (Age >= 65y). Weakness. Left-sided numbness. EXAM: CT CERVICAL SPINE WITHOUT CONTRAST TECHNIQUE: Multidetector CT imaging of the cervical spine was performed without intravenous contrast. Multiplanar CT image reconstructions were also generated. RADIATION DOSE REDUCTION: This exam was performed according to the departmental dose-optimization program which includes automated exposure control, adjustment of the mA and/or kV according to patient size and/or use of iterative reconstruction technique. COMPARISON:  Cervical spine CT 11/11/2023 04/01/2022 FINDINGS: Alignment: Straightening of the normal cervical lordosis. Unchanged trace retrolisthesis of C4 on C5. Skull base and vertebrae: No acute fracture or destructive process. Lucent focus in the left C2 lamina extending into the spinous process, unchanged from 2023 favoring a benign etiology. Soft tissues and spinal canal: No prevertebral fluid or swelling. No visible canal hematoma. Disc levels: Mild-to-moderate disc degeneration, greatest at C4-5 and C6-7. No suspected high-grade stenosis. Upper chest: Emphysema. Other: None. IMPRESSION: No acute cervical spine fracture or traumatic malalignment. Electronically Signed   By: Sebastian Ache M.D.   On: 01/31/2024 13:17   DG Chest 2 View Result Date: 01/31/2024 CLINICAL DATA:  Shortness of breath EXAM:  CHEST - 2 VIEW COMPARISON:  12/31/2023 FINDINGS: Mild cardiomegaly. No focal airspace consolidation, pleural effusion, or pneumothorax. IMPRESSION: No active cardiopulmonary disease. Electronically Signed   By: Duanne Guess D.O.   On: 01/31/2024 11:54    Procedures Procedures    Medications Ordered in ED Medications  feeding supplement (ENSURE ENLIVE / ENSURE PLUS) liquid 237 mL (237 mLs Oral Given 01/31/24 1350)    ED Course/ Medical Decision Making/ A&P Clinical Course as of 01/31/24 1527  Sun Jan 31, 2024  1225 DG Chest 2 View Chest x-ray without acute findings. [JK]  1225 CBC with Differential(!) White blood cell count increased compared to previous. [JK]  1225 Comprehensive metabolic panel(!) Bicarb decreased compared to previous. [JK]  1225 Troponin I (High Sensitivity)(!) Troponin slightly elevated, similar to previous values.  BNP normal. [JK]  1512 Patient's repeat troponin is stable.  COVID flu RSV negative. [JK]  1512 Cervical spine CT does not show any evidence of acute abnormality.  Patient does have disc degeneration but no high-grade stenosis findings. [JK]    Clinical Course User Index [JK] Linwood Dibbles, MD                                 Medical Decision Making Amount and/or Complexity of Data Reviewed Labs:  Decision-making details documented in ED Course. Radiology: ordered. Decision-making details documented in ED Course.  Risk OTC drugs. Prescription drug management.  Previous records reviewed.  Patient was admitted to the hospital on January 2 for evaluation after a fall.  Patient also had been complaining of generalized weakness.  Patient was noted to have a UTI.  He was started on antibiotics.  He had an MRI of his brain on January 2 that did not show any evidence of acute abnormality.  He had remote left frontal infarct.  Patient presented  with 2 complaints in the ED today with chest wall pain as well as paresthesias, numbness in his hands and feet.   ED workup is reassuring.  No findings suggest CHF exacerbation.  He does not have any evidence of pneumonia.  Cardiac enzymes show slight elevations but they are stable and not significantly changed compared to previous values.  Doubt ACS.  Patient does have reproducible tenderness in the left chest wall.  Patient recently had an MRI that did not show any acute stroke.  His cervical spine CT does not show any signs of any mass or severe neck impingement.  I do not see any acute etiology that requires hospitalization at this time.  Patient otherwise appears well and has been eating and drinking without any difficulty.  Will have him follow-up with neurology as an outpatient.  Recommend follow-up with his PCP.  Ambulatory referral provided        Final Clinical Impression(s) / ED Diagnoses Final diagnoses:  Paresthesia  Chest wall pain    Rx / DC Orders ED Discharge Orders          Ordered    lidocaine (LIDODERM) 5 %  Every 24 hours        01/31/24 1525    Ambulatory referral to Neurology       Comments: An appointment is requested in approximately: 2-4 weeks   01/31/24 1525              Linwood Dibbles, MD 01/31/24 1527

## 2024-01-31 NOTE — ED Provider Triage Note (Signed)
Emergency Medicine Provider Triage Evaluation Note  DION SIBAL , a 73 y.o. male  was evaluated in triage.  Pt complains of feeling generally weak like his legs are getting give out on him and feeling more short of breath since his hospital discharge on 01/02/24.  Denies any chest pain.  Denies any cough, fever or chills.  No acute worsening today  Review of Systems  Positive: As above Negative: As above  Physical Exam  BP (!) 196/58 (BP Location: Left Arm)   Pulse 91   Temp 98.1 F (36.7 C) (Oral)   Resp (!) 91   SpO2 100%  Gen:   Awake, no distress   Resp:  Normal effort, talking in full sentences without difficulty MSK:   Moves extremities without difficulty    Medical Decision Making  Medically screening exam initiated at 10:31 AM.  Appropriate orders placed.  Delma Officer Granderson was informed that the remainder of the evaluation will be completed by another provider, this initial triage assessment does not replace that evaluation, and the importance of remaining in the ED until their evaluation is complete.     Arabella Merles, PA-C 01/31/24 1035

## 2024-01-31 NOTE — Discharge Instructions (Signed)
Apply the pain patch on your chest wall to help with the pain and discomfort.  You can also take over-the-counter Tylenol.  Follow-up with your primary care doctor to be rechecked and have also placed an outpatient referral to a neurologist for further evaluation of the numbness in her hands and feet

## 2024-01-31 NOTE — ED Triage Notes (Signed)
Patient reports weakness, SOB, chest pain, and nausea x 5 days. Patient also reports left sided numbness x 1 month.

## 2024-03-05 ENCOUNTER — Observation Stay (HOSPITAL_COMMUNITY)
Admission: EM | Admit: 2024-03-05 | Discharge: 2024-03-06 | Disposition: A | Discharge status: Home / Self Care | Attending: Family Medicine | Admitting: Internal Medicine

## 2024-03-05 ENCOUNTER — Emergency Department (HOSPITAL_COMMUNITY)

## 2024-03-05 ENCOUNTER — Encounter (HOSPITAL_COMMUNITY): Payer: Self-pay | Admitting: Emergency Medicine

## 2024-03-05 ENCOUNTER — Other Ambulatory Visit: Payer: Self-pay

## 2024-03-05 DIAGNOSIS — G6289 Other specified polyneuropathies: Secondary | ICD-10-CM | POA: Diagnosis not present

## 2024-03-05 DIAGNOSIS — Z22358 Carrier of other enterobacterales: Secondary | ICD-10-CM | POA: Insufficient documentation

## 2024-03-05 DIAGNOSIS — N3000 Acute cystitis without hematuria: Secondary | ICD-10-CM | POA: Diagnosis present

## 2024-03-05 DIAGNOSIS — I421 Obstructive hypertrophic cardiomyopathy: Secondary | ICD-10-CM | POA: Insufficient documentation

## 2024-03-05 DIAGNOSIS — B9629 Other Escherichia coli [E. coli] as the cause of diseases classified elsewhere: Secondary | ICD-10-CM | POA: Diagnosis present

## 2024-03-05 DIAGNOSIS — Z794 Long term (current) use of insulin: Secondary | ICD-10-CM | POA: Diagnosis not present

## 2024-03-05 DIAGNOSIS — I10 Essential (primary) hypertension: Secondary | ICD-10-CM | POA: Diagnosis not present

## 2024-03-05 DIAGNOSIS — R600 Localized edema: Secondary | ICD-10-CM | POA: Insufficient documentation

## 2024-03-05 DIAGNOSIS — Z7901 Long term (current) use of anticoagulants: Secondary | ICD-10-CM | POA: Insufficient documentation

## 2024-03-05 DIAGNOSIS — N3001 Acute cystitis with hematuria: Secondary | ICD-10-CM

## 2024-03-05 DIAGNOSIS — N3080 Other cystitis without hematuria: Principal | ICD-10-CM | POA: Insufficient documentation

## 2024-03-05 DIAGNOSIS — E876 Hypokalemia: Secondary | ICD-10-CM | POA: Diagnosis not present

## 2024-03-05 DIAGNOSIS — Z79899 Other long term (current) drug therapy: Secondary | ICD-10-CM | POA: Diagnosis not present

## 2024-03-05 DIAGNOSIS — F17210 Nicotine dependence, cigarettes, uncomplicated: Secondary | ICD-10-CM | POA: Diagnosis not present

## 2024-03-05 DIAGNOSIS — E119 Type 2 diabetes mellitus without complications: Secondary | ICD-10-CM | POA: Diagnosis not present

## 2024-03-05 DIAGNOSIS — D638 Anemia in other chronic diseases classified elsewhere: Secondary | ICD-10-CM | POA: Diagnosis present

## 2024-03-05 DIAGNOSIS — R300 Dysuria: Secondary | ICD-10-CM | POA: Diagnosis present

## 2024-03-05 DIAGNOSIS — K219 Gastro-esophageal reflux disease without esophagitis: Secondary | ICD-10-CM | POA: Insufficient documentation

## 2024-03-05 DIAGNOSIS — M791 Myalgia, unspecified site: Secondary | ICD-10-CM | POA: Diagnosis not present

## 2024-03-05 DIAGNOSIS — N400 Enlarged prostate without lower urinary tract symptoms: Secondary | ICD-10-CM | POA: Diagnosis not present

## 2024-03-05 DIAGNOSIS — Z8659 Personal history of other mental and behavioral disorders: Secondary | ICD-10-CM | POA: Diagnosis not present

## 2024-03-05 LAB — CBC WITH DIFFERENTIAL/PLATELET
Abs Immature Granulocytes: 0.05 10*3/uL (ref 0.00–0.07)
Basophils Absolute: 0.1 10*3/uL (ref 0.0–0.1)
Basophils Relative: 1 %
Eosinophils Absolute: 0.5 10*3/uL (ref 0.0–0.5)
Eosinophils Relative: 4 %
HCT: 40.5 % (ref 39.0–52.0)
Hemoglobin: 12.7 g/dL — ABNORMAL LOW (ref 13.0–17.0)
Immature Granulocytes: 0 %
Lymphocytes Relative: 25 %
Lymphs Abs: 3.2 10*3/uL (ref 0.7–4.0)
MCH: 23.3 pg — ABNORMAL LOW (ref 26.0–34.0)
MCHC: 31.4 g/dL (ref 30.0–36.0)
MCV: 74.4 fL — ABNORMAL LOW (ref 80.0–100.0)
Monocytes Absolute: 0.9 10*3/uL (ref 0.1–1.0)
Monocytes Relative: 7 %
Neutro Abs: 8.1 10*3/uL — ABNORMAL HIGH (ref 1.7–7.7)
Neutrophils Relative %: 63 %
Platelets: 305 10*3/uL (ref 150–400)
RBC: 5.44 MIL/uL (ref 4.22–5.81)
RDW: 16.3 % — ABNORMAL HIGH (ref 11.5–15.5)
WBC: 12.9 10*3/uL — ABNORMAL HIGH (ref 4.0–10.5)
nRBC: 0 % (ref 0.0–0.2)

## 2024-03-05 LAB — URINALYSIS, ROUTINE W REFLEX MICROSCOPIC
Bilirubin Urine: NEGATIVE
Glucose, UA: NEGATIVE mg/dL
Ketones, ur: NEGATIVE mg/dL
Nitrite: NEGATIVE
Protein, ur: 100 mg/dL — AB
RBC / HPF: >50 RBC/hpf (ref 0–5)
Specific Gravity, Urine: 1.013 (ref 1.005–1.030)
WBC, UA: >50 WBC/hpf (ref 0–5)
pH: 5 (ref 5.0–8.0)

## 2024-03-05 LAB — COMPREHENSIVE METABOLIC PANEL
ALT: 17 U/L (ref 0–44)
AST: 22 U/L (ref 15–41)
Albumin: 3.5 g/dL (ref 3.5–5.0)
Alkaline Phosphatase: 53 U/L (ref 38–126)
Anion gap: 10 (ref 5–15)
BUN: 15 mg/dL (ref 8–23)
CO2: 24 mmol/L (ref 22–32)
Calcium: 9.1 mg/dL (ref 8.9–10.3)
Chloride: 105 mmol/L (ref 98–111)
Creatinine, Ser: 0.99 mg/dL (ref 0.61–1.24)
GFR, Estimated: >60 mL/min (ref >60)
Glucose, Bld: 112 mg/dL — ABNORMAL HIGH (ref 70–99)
Potassium: 3.2 mmol/L — ABNORMAL LOW (ref 3.5–5.1)
Sodium: 139 mmol/L (ref 135–145)
Total Bilirubin: 0.8 mg/dL (ref 0.0–1.2)
Total Protein: 7 g/dL (ref 6.5–8.1)

## 2024-03-05 LAB — CK: Total CK: 64 U/L (ref 49–397)

## 2024-03-05 LAB — PROTIME-INR
INR: 1 (ref 0.8–1.2)
Prothrombin Time: 13.2 s (ref 11.4–15.2)

## 2024-03-05 LAB — BRAIN NATRIURETIC PEPTIDE: B Natriuretic Peptide: 239.8 pg/mL — ABNORMAL HIGH (ref 0.0–100.0)

## 2024-03-05 MED ORDER — SULFAMETHOXAZOLE-TRIMETHOPRIM 800-160 MG PO TABS: 1.0000 | ORAL_TABLET | Freq: Two times a day (BID) | ORAL | 0 refills | Status: DC

## 2024-03-05 MED ORDER — VERAPAMIL HCL ER 180 MG PO TBCR
360.0000 mg | EXTENDED_RELEASE_TABLET | Freq: Every day | ORAL | Status: DC
Start: 2024-03-06 — End: 2024-03-06
  Administered 2024-03-06: 360 mg via ORAL
  Filled 2024-03-05: qty 2

## 2024-03-05 MED ORDER — SULFAMETHOXAZOLE-TRIMETHOPRIM 800-160 MG PO TABS
1.0000 | ORAL_TABLET | Freq: Once | ORAL | Status: DC
Start: 2024-03-05 — End: 2024-03-05

## 2024-03-05 MED ORDER — HYDRALAZINE HCL 25 MG PO TABS
100.0000 mg | ORAL_TABLET | Freq: Three times a day (TID) | ORAL | Status: DC
  Administered 2024-03-06 (×3): 100 mg via ORAL
  Filled 2024-03-05 (×3): qty 4

## 2024-03-05 MED ORDER — NALOXONE HCL 0.4 MG/ML IJ SOLN: 0.4000 mg | INTRAMUSCULAR | Status: DC | PRN

## 2024-03-05 MED ORDER — GABAPENTIN 300 MG PO CAPS
600.0000 mg | ORAL_CAPSULE | Freq: Three times a day (TID) | ORAL | Status: DC
  Administered 2024-03-06 (×2): 600 mg via ORAL
  Filled 2024-03-05 (×2): qty 2

## 2024-03-05 MED ORDER — PANTOPRAZOLE SODIUM 40 MG PO TBEC
40.0000 mg | DELAYED_RELEASE_TABLET | Freq: Every day | ORAL | Status: DC
  Administered 2024-03-06: 40 mg via ORAL
  Filled 2024-03-05: qty 1

## 2024-03-05 MED ORDER — CEFEPIME HCL 2 G IV SOLR
2.0000 g | Freq: Three times a day (TID) | INTRAVENOUS | Status: DC
  Administered 2024-03-06 (×2): 2 g via INTRAVENOUS
  Filled 2024-03-05 (×2): qty 12.5

## 2024-03-05 MED ORDER — ACETAMINOPHEN 650 MG RE SUPP: 650.0000 mg | Freq: Four times a day (QID) | RECTAL | Status: DC | PRN

## 2024-03-05 MED ORDER — IBUPROFEN 200 MG PO TABS
600.0000 mg | ORAL_TABLET | Freq: Once | ORAL | Status: AC
  Administered 2024-03-05: 600 mg via ORAL
  Filled 2024-03-05 (×2): qty 3

## 2024-03-05 MED ORDER — OXYCODONE-ACETAMINOPHEN 5-325 MG PO TABS: 1.0000 | ORAL_TABLET | ORAL | Status: DC | PRN

## 2024-03-05 MED ORDER — TAMSULOSIN HCL 0.4 MG PO CAPS: 0.4000 mg | ORAL_CAPSULE | Freq: Every day | ORAL | Status: DC

## 2024-03-05 MED ORDER — CARVEDILOL 12.5 MG PO TABS
25.0000 mg | ORAL_TABLET | Freq: Two times a day (BID) | ORAL | Status: DC
  Administered 2024-03-06: 25 mg via ORAL
  Filled 2024-03-05: qty 2

## 2024-03-05 MED ORDER — ACETAMINOPHEN 325 MG PO TABS: 650.0000 mg | ORAL_TABLET | Freq: Four times a day (QID) | ORAL | Status: DC | PRN

## 2024-03-05 MED ORDER — HYDRALAZINE HCL 20 MG/ML IJ SOLN: 10.0000 mg | INTRAMUSCULAR | Status: DC | PRN

## 2024-03-05 MED ORDER — ATORVASTATIN CALCIUM 40 MG PO TABS
40.0000 mg | ORAL_TABLET | Freq: Every day | ORAL | Status: DC
  Administered 2024-03-06: 40 mg via ORAL
  Filled 2024-03-05: qty 1

## 2024-03-05 MED ORDER — ONDANSETRON HCL 4 MG/2ML IJ SOLN: 4.0000 mg | Freq: Four times a day (QID) | INTRAMUSCULAR | Status: DC | PRN

## 2024-03-05 MED ORDER — POTASSIUM CHLORIDE CRYS ER 20 MEQ PO TBCR
40.0000 meq | EXTENDED_RELEASE_TABLET | Freq: Once | ORAL | Status: AC
  Administered 2024-03-05: 40 meq via ORAL
  Filled 2024-03-05: qty 2

## 2024-03-05 MED ORDER — OXYCODONE HCL 5 MG PO TABS
5.0000 mg | ORAL_TABLET | ORAL | Status: AC
  Administered 2024-03-05: 5 mg via ORAL
  Filled 2024-03-05 (×2): qty 1

## 2024-03-05 MED ORDER — INSULIN ASPART 100 UNIT/ML IJ SOLN
0.0000 [IU] | Freq: Three times a day (TID) | INTRAMUSCULAR | Status: DC
  Filled 2024-03-05: qty 0.06

## 2024-03-05 MED ORDER — SODIUM CHLORIDE 0.9 % IV SOLN
2.0000 g | Freq: Once | INTRAVENOUS | Status: AC
  Administered 2024-03-05: 2 g via INTRAVENOUS
  Filled 2024-03-05: qty 12.5

## 2024-03-05 MED ORDER — MELATONIN 3 MG PO TABS: 3.0000 mg | ORAL_TABLET | Freq: Every evening | ORAL | Status: DC | PRN

## 2024-03-05 MED ORDER — ACETAMINOPHEN 500 MG PO TABS
1000.0000 mg | ORAL_TABLET | ORAL | Status: AC
  Administered 2024-03-05: 1000 mg via ORAL
  Filled 2024-03-05: qty 2

## 2024-03-05 NOTE — Progress Notes (Signed)
 ED Pharmacy Antibiotic Sign Off An antibiotic consult was received from an ED provider for cefepime  per pharmacy dosing for UTI. A chart review was completed to assess appropriateness.   The following one time order(s) were placed:  - cefepime 2gm IV x1  Further antibiotic and/or antibiotic pharmacy consults should be ordered by the admitting provider if indicated.   Thank you for allowing pharmacy to be a part of this patient's care.   Lucia Gaskins, [facility]  Clinical Pharmacist 03/05/24 8:01 PM

## 2024-03-05 NOTE — ED Provider Notes (Signed)
 Received patient from previous provider.  See his note.  In short, patient presents to emergency department for evaluation of dysuria, flank pain, hematuria, myalgia, bilateral pedal edema.  ED workup is notable for mild leukocytosis of 12.9, BNP 239, urine with large hemoglobin, moderate leukocytes, many bacteria, WBC. CXR negative for effusion. 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 No stones.  Has had UTI in past growing E coli and was susceptible to cefepime in past.  Initiated sepsis workup. Cultures, lactate pending. Will admit for IV abx.  Consulted urology Dr. Zettie Pho who had no additional recommendations beyond IV abx, hospitalist admission Consulted hospitalist Dr. Newton Pigg who accepts patient for admission   Judithann Sheen, PA 03/05/24 2221    Melene Plan, DO 03/05/24 2321

## 2024-03-05 NOTE — ED Triage Notes (Signed)
 Pt bib EMS from home for extremity tightness that started a month ago. Pt noticed his left ankle swelling this morning. Pt having some mild nausea but denies vomiting and diarrhea. Pain in stomach and left side.

## 2024-03-05 NOTE — H&P (Signed)
 History and Physical      [name] EAV:[id number] DOB: July 27, 1951 DOA: 03/05/2024; DOS: 03/05/2024  PCP: Fleet Contras, MD *** Patient coming from: home ***  I have personally briefly reviewed patient's old medical records in [facility]  Chief Complaint: ***  HPI: DISHON KEHOE is a 73 y.o. male with medical history significant for *** who is admitted to [facility] on 03/05/2024 with *** after presenting from home*** to [facility] ED complaining of ***.   ***        ***  ED Course:  Vital signs in the ED were notable for the following: ***  Labs were notable for the following: ***  Per my interpretation, EKG in ED demonstrated the following:  ***  Imaging in the ED, per corresponding formal radiology read, was notable for the following: ***  EDP d/w on-call urology, Dr. Dalbert Mayotte, who agreed with antibiotic selection, and conveyed no need for additional urology involvement at this time.    While in the ED, the following were administered: ***  Subsequently, the patient was admitted  ***  ***red   Review of Systems: As per HPI otherwise 10 point review of systems negative.   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 He uses smokeless tobacco. He reports  that he does not currently use alcohol. He reports that he does not currently use drugs after having used the following drugs: Marijuana, Cocaine, and IV.   No Known Allergies  Family History  Problem Relation Age of Onset   CAD Father        "heart attack in his 78's"   Hypertension Father    Diabetes Brother    Diabetes Sister    Hypertension Mother     Family history reviewed and not pertinent ***   Prior to Admission medications   Medication Sig Start Date End Date Taking? Authorizing Provider  sulfamethoxazole-trimethoprim (BACTRIM DS) 800-160 MG tablet Take 1 tablet by mouth 2 (two) times daily for 10 days. 03/05/24 03/15/24 Yes Rondel Baton, MD  albuterol (PROVENTIL HFA;VENTOLIN HFA) 108 (90 Base) MCG/ACT inhaler Inhale 1-2 puffs into the lungs every 6 (six) hours as needed for wheezing. 09/04/16   Rolland Porter, MD  aspirin EC 81 MG tablet Take 81 mg by mouth every morning.    [provider]  atorvastatin (LIPITOR) 40 MG tablet Take 1 tablet (40 mg total) by mouth daily. 11/12/23   Danford, Earl Lites, MD  carvedilol (COREG) 25 MG tablet Take 1 tablet (25 mg total) by mouth 2 (two) times daily with a meal. 12/04/23   Tyrone Nine, MD  diclofenac Sodium (VOLTAREN) 1 % GEL Apply 2 g topically 4 (four)  times daily. 11/12/23   Danford, Earl Lites, MD  gabapentin (NEURONTIN) 600 MG tablet Take 600 mg by mouth 3 (three) times daily. 12/08/23   [provider]  hydrALAZINE (APRESOLINE) 100 MG tablet Take 1 tablet (100 mg total) by mouth every 8 (eight) hours. 12/04/23   Tyrone Nine, MD  ibuprofen (ADVIL) 800 MG tablet Take 800 mg by mouth 3 (three) times daily as needed. 12/17/23   [provider]  lidocaine (LIDODERM) 5 % Place 1 patch onto the skin daily. Remove & Discard patch within 12 hours or as directed by MD 01/31/24   Linwood Dibbles, MD  metFORMIN (GLUCOPHAGE) 500 MG tablet Take 1 tablet (500 mg total) by mouth 2 (two) times daily. 11/12/23    Danford, Earl Lites, MD  nicotine (NICODERM CQ - DOSED IN MG/24 HOURS) 21 mg/24hr patch Place 1 patch (21 mg total) onto the skin daily. Patient taking differently: Place 21 mg onto the skin daily as needed (smoking cessation). 02/21/20   Rodolph Bong, MD  omeprazole (PRILOSEC) 20 MG capsule Take 1 capsule (20 mg total) by mouth daily. 12/14/22   Arby Barrette, MD  ondansetron (ZOFRAN-ODT) 8 MG disintegrating tablet Take 1 tablet (8 mg total) by mouth every 8 (eight) hours as needed for nausea or vomiting. 08/19/22   Margarita Grizzle, MD  tamsulosin (FLOMAX) 0.4 MG CAPS capsule Take 1 capsule (0.4 mg total) by mouth daily after supper. 11/12/23   Danford, Earl Lites, MD  traMADol (ULTRAM) 50 MG tablet Take 1 tablet by mouth twice a day as needed for severe pains 06/04/21     verapamil (CALAN-SR) 180 MG CR tablet Take 2 tablets (360 mg total) by mouth daily. 11/12/23   Alberteen Sam, MD     Objective    Physical Exam: Vitals:   03/05/24 1700 03/05/24 1830 03/05/24 1933 03/05/24 2141  BP: (!) 159/54 118/64  (!) 172/52  Pulse: 69 62  (!) 57  Resp: 18 18  16   Temp:   98.5 F (36.9 C)   TempSrc:   Oral   SpO2: 93% 94%  96%  Weight:      Height:        General: appears to be stated age; alert, oriented Skin: warm, dry, no rash Head:  AT/[place] Mouth:  Oral mucosa membranes appear moist, normal dentition Neck: supple; trachea midline Heart:  RRR; did not appreciate any M/R/G Lungs: CTAB, did not appreciate any wheezes, rales, or rhonchi Abdomen: + BS; soft, ND, NT Vascular: 2+ pedal pulses b/l; 2+ radial pulses b/l Extremities: no peripheral edema, no muscle wasting Neuro: strength and sensation intact in upper and lower extremities b/l    *** Neuro: 5/5 strength of the proximal and distal flexors and extensors of the upper and lower extremities bilaterally; sensation intact in upper and lower extremities b/l; cranial nerves II through XII grossly intact; no pronator  drift; no evidence suggestive of slurred speech, dysarthria, or facial droop; Normal muscle tone. No tremors. *** Neuro: In the setting of the patient's current mental status and associated inability to follow instructions, unable to perform full neurologic exam at this time.  As such, assessment of strength, sensation, and cranial nerves is limited at this time. Patient noted to spontaneously move all 4 extremities. No tremors.  ***    Labs on Admission: I have personally reviewed following labs and imaging studies  CBC: Recent Labs  Lab 03/05/24 1523  WBC 12.9*  NEUTROABS 8.1*  HGB 12.7*  HCT 40.5  MCV 74.4*  PLT 305   Basic Metabolic Panel: Recent Labs  Lab 03/05/24 1523  NA 139  K 3.2*  CL 105  CO2 24  GLUCOSE 112*  BUN 15  CREATININE 0.99  CALCIUM 9.1   GFR: Estimated Creatinine Clearance: 68.4 mL/min (by C-G formula based on SCr of 0.99 mg/dL). Liver Function Tests: Recent Labs  Lab 03/05/24 1523  AST 22  ALT 17  ALKPHOS 53  BILITOT 0.8  PROT 7.0  ALBUMIN 3.5   No results for input(s): "LIPASE", "AMYLASE" in the last 168 hours. No results for input(s): "AMMONIA" in the last 168 hours. Coagulation Profile: Recent Labs  Lab 03/05/24 1523  INR 1.0   Cardiac Enzymes: Recent Labs  Lab 03/05/24 2059  CKTOTAL 64   BNP (last 3 results) No results for input(s): "PROBNP" in the last 8760 hours. HbA1C: No results for input(s): "HGBA1C" in the last 72 hours. CBG: No results for input(s): "GLUCAP" in the last 168 hours. Lipid Profile: No results for input(s): "CHOL", "HDL", "LDLCALC", "TRIG", "CHOLHDL", "LDLDIRECT" in the last 72 hours. Thyroid Function Tests: No results for input(s): "TSH", "T4TOTAL", "FREET4", "T3FREE", "THYROIDAB" in the last 72 hours. Anemia Panel: No results for input(s): "VITAMINB12", "FOLATE", "FERRITIN", "TIBC", "IRON", "RETICCTPCT" in the last 72 hours. Urine analysis:    Component Value Date/Time   COLORURINE RED (A)  03/05/2024 1600   APPEARANCEUR CLOUDY (A) 03/05/2024 1600   LABSPEC 1.013 03/05/2024 1600   PHURINE 5.0 03/05/2024 1600   GLUCOSEU NEGATIVE 03/05/2024 1600   HGBUR LARGE (A) 03/05/2024 1600   BILIRUBINUR NEGATIVE 03/05/2024 1600   KETONESUR NEGATIVE 03/05/2024 1600   PROTEINUR 100 (A) 03/05/2024 1600   UROBILINOGEN 2.0 (H) 08/06/2015 1209   NITRITE NEGATIVE 03/05/2024 1600   LEUKOCYTESUR MODERATE (A) 03/05/2024 1600    Radiological Exams on Admission: CT Renal Stone Study Result Date: 03/05/2024 CLINICAL DATA:  Abdominal/flank pain, stone suspected EXAM: CT ABDOMEN AND PELVIS WITHOUT CONTRAST TECHNIQUE: Multidetector CT imaging of the abdomen and pelvis was performed following the standard protocol without IV contrast. RADIATION DOSE REDUCTION: This exam was performed according to the departmental dose-optimization program which includes automated exposure control, adjustment of the mA and/or kV according to patient size and/or use of iterative reconstruction technique. COMPARISON:  CT 12/02/2023 FINDINGS: Lower chest: Small hiatal hernia. Scattered small pulmonary cysts are unchanged from prior. No acute airspace disease. Hepatobiliary: Tiny hypodensity in the left lobe of the liver series 2, image 18, unchanged from prior exam. New hepatic abnormality Gallbladder physiologically distended, no calcified stone. No biliary dilatation. Pancreas: No ductal dilatation or inflammation. Spleen: Unremarkable unenhanced appearance. Adrenals/Urinary Tract: Normal adrenal glands. No hydronephrosis or renal calculi. Decompressed ureters. There is faint symmetric bilateral perinephric edema. Bladder appears trabeculated. Diffuse bladder wall thickening with perivesicular edema. There is air within the urinary bladder and possibly bladder wall. There is a large superior right bladder diverticulum as well as smaller left posterior bladder diverticulum. Stomach/Bowel: Small hiatal hernia. No bowel obstruction or  inflammation. Colonic diverticulosis without diverticulitis. Moderate colonic stool burden. The appendix is not definitively seen. Vascular/Lymphatic: Aortic and branch atherosclerosis. No aneurysm. No portal venous gas. No enlarged lymph nodes. Reproductive: Prostate is unremarkable. Other: Fat in both inguinal canals.  No free fluid or free air. Musculoskeletal: There are no acute or suspicious osseous abnormalities. The bones are under mineralized IMPRESSION: 1. Diffuse bladder wall thickening with perivesicular edema, likely representing cystitis. Air within the urinary bladder and possibly bladder wall, suspicious  for emphysematous cystitis. Bladder diverticula again seen. 2. No hydronephrosis or renal calculi. 3. Colonic diverticulosis without diverticulitis. 4. Small hiatal hernia. Aortic Atherosclerosis (ICD10-I70.0). Electronically Signed   By: Narda Rutherford M.D.   On: 03/05/2024 20:50   DG Chest 2 View Result Date: 03/05/2024 CLINICAL DATA:  Acute left ankle swelling. EXAM: CHEST - 2 VIEW COMPARISON:  January 31, 2024. FINDINGS: The heart size and mediastinal contours are within normal limits. Both lungs are clear. The visualized skeletal structures are unremarkable. IMPRESSION: No active cardiopulmonary disease. Electronically Signed   By: Lupita Raider M.D.   On: 03/05/2024 15:44      Assessment/Plan    Principal Problem:   Acute cystitis  ***        ***                  ***                  ***                  ***                  ***                 ***                  ***                  ***                  ***                  ***                  ***                  ***                 ***     DVT prophylaxis: SCD's ***  Code Status: Full  code*** Family Communication: none*** Disposition Plan: Per Rounding Team Consults called: EDP d/w on-call urology, Dr. Dalbert Mayotte, who agreed with antibiotic selection, and conveyed no need for additional urology involvement at this time.  ;  Admission status: ***    I SPENT GREATER THAN 75 *** MINUTES IN CLINICAL CARE TIME/MEDICAL DECISION-MAKING IN COMPLETING THIS ADMISSION.     Chaney Born Ezrie Bunyan DO [facility] From 7PM - 7AM   03/05/2024, 10:07 PM   ***

## 2024-03-05 NOTE — Progress Notes (Signed)
 Pharmacy Antibiotic Note  [name] is a 73 y.o. male admitted on 03/05/2024 with UTI, hx of ESBP sensitive to cefepime, pharmacy to dose.   Plan: Cefepime 2gm IV q8h Follow renal function,cultures and clinical course  Height: 5\' 9"  (175.3 cm) Weight: 72.6 kg (160 lb) IBW/kg (Calculated) : 70.7  Temp (24hrs), Avg:98.4 F (36.9 C), Min:98.4 F (36.9 C), Max:98.5 F (36.9 C)  Recent Labs  Lab 03/05/24 1523  WBC 12.9*  CREATININE 0.99    Estimated Creatinine Clearance: 68.4 mL/min (by C-G formula based on SCr of 0.99 mg/dL).    No Known Allergies   Thank you for allowing pharmacy to be a part of this patient's care. Arley Phenix RPh 03/05/2024, 10:11 PM

## 2024-03-05 NOTE — ED Provider Notes (Signed)
  EMERGENCY DEPARTMENT AT [facility] Provider Note   CSN: [id number] Arrival date & time: 03/05/24  1328     History {Add pertinent medical, surgical, social history, OB history to HPI:1} Chief Complaint  Patient presents with   Generalized Body Aches    [name] is a 73 y.o. male.  73 year old male with a history of HOCM, CHF, CVA, diabetes, and polysubstance abuse who presents emergency department with diffuse myalgias, dysuria, and leg swelling.  Has been having dysuria recently.  Today also noticed that he was having hematuria.  Says that he is having diffuse myalgias and flank pain.  Patient reports that today he noticed that both of his legs were swollen.  No significant pain.  No shortness of breath.  No weight gain.  Not currently on a diuretic.  Has not tried any other medications for this. Also complaining of numbness and tingling in his hands bilaterally.  Says that he occasionally feels it in his feet as well.        Home Medications Prior to Admission medications   Medication Sig Start Date End Date Taking? Authorizing Provider  albuterol (PROVENTIL HFA;VENTOLIN HFA) 108 (90 Base) MCG/ACT inhaler Inhale 1-2 puffs into the lungs every 6 (six) hours as needed for wheezing. 09/04/16   Rolland Porter, MD  aspirin EC 81 MG tablet Take 81 mg by mouth every morning.    [provider]  atorvastatin (LIPITOR) 40 MG tablet Take 1 tablet (40 mg total) by mouth daily. 11/12/23   Danford, Earl Lites, MD  carvedilol (COREG) 25 MG tablet Take 1 tablet (25 mg total) by mouth 2 (two) times daily with a meal. 12/04/23   Tyrone Nine, MD  diclofenac Sodium (VOLTAREN) 1 % GEL Apply 2 g topically 4 (four) times daily. 11/12/23   Danford, Earl Lites, MD  gabapentin (NEURONTIN) 600 MG tablet Take 600 mg by mouth 3 (three) times daily. 12/08/23   [provider]  hydrALAZINE (APRESOLINE) 100 MG tablet Take 1 tablet (100 mg total) by mouth every 8  (eight) hours. 12/04/23   Tyrone Nine, MD  ibuprofen (ADVIL) 800 MG tablet Take 800 mg by mouth 3 (three) times daily as needed. 12/17/23   [provider]  lidocaine (LIDODERM) 5 % Place 1 patch onto the skin daily. Remove & Discard patch within 12 hours or as directed by MD 01/31/24   Linwood Dibbles, MD  metFORMIN (GLUCOPHAGE) 500 MG tablet Take 1 tablet (500 mg total) by mouth 2 (two) times daily. 11/12/23   Danford, Earl Lites, MD  nicotine (NICODERM CQ - DOSED IN MG/24 HOURS) 21 mg/24hr patch Place 1 patch (21 mg total) onto the skin daily. Patient taking differently: Place 21 mg onto the skin daily as needed (smoking cessation). 02/21/20   Rodolph Bong, MD  omeprazole (PRILOSEC) 20 MG capsule Take 1 capsule (20 mg total) by mouth daily. 12/14/22   Arby Barrette, MD  ondansetron (ZOFRAN-ODT) 8 MG disintegrating tablet Take 1 tablet (8 mg total) by mouth every 8 (eight) hours as needed for nausea or vomiting. 08/19/22   Margarita Grizzle, MD  tamsulosin (FLOMAX) 0.4 MG CAPS capsule Take 1 capsule (0.4 mg total) by mouth daily after supper. 11/12/23   Danford, Earl Lites, MD  traMADol (ULTRAM) 50 MG tablet Take 1 tablet by mouth twice a day as needed for severe pains 06/04/21     verapamil (CALAN-SR) 180 MG CR tablet Take 2 tablets (360 mg total) by  mouth daily. 11/12/23   Danford, Earl Lites, MD      Allergies    Patient has no known allergies.    Review of Systems   Review of Systems  Physical Exam Updated Vital Signs BP (!) 181/64   Pulse 62   Temp 98.4 F (36.9 C)   Resp 18   Ht 5\' 9"  (1.753 m)   Wt 72.6 kg   SpO2 93%   BMI 23.63 kg/m  Physical Exam Vitals and nursing note reviewed.  Constitutional:      General: He is not in acute distress.    Appearance: He is well-developed.  HENT:     Head: Normocephalic and atraumatic.     Right Ear: External ear normal.     Left Ear: External ear normal.     Nose: Nose normal.  Eyes:     Extraocular Movements:  Extraocular movements intact.     Conjunctiva/sclera: Conjunctivae normal.     Pupils: Pupils are equal, round, and reactive to light.  Cardiovascular:     Rate and Rhythm: Normal rate and regular rhythm.     Heart sounds: Normal heart sounds.  Pulmonary:     Effort: Pulmonary effort is normal. No respiratory distress.     Breath sounds: Normal breath sounds.  Abdominal:     General: There is no distension.     Palpations: There is no mass.     Tenderness: There is no abdominal tenderness. There is right CVA tenderness. There is no left CVA tenderness or guarding.  Musculoskeletal:     Cervical back: Normal range of motion and neck supple.     Right lower leg: Edema (1+) present.     Left lower leg: Edema (1+) present.  Skin:    General: Skin is warm and dry.  Neurological:     Mental Status: He is alert. Mental status is at baseline.  Psychiatric:        Mood and Affect: Mood normal.        Behavior: Behavior normal.     ED Results / Procedures / Treatments   Labs (all labs ordered are listed, but only abnormal results are displayed) Labs Reviewed - No data to display  EKG None  Radiology No results found.  Procedures Procedures  {Document cardiac monitor, telemetry assessment procedure when appropriate:1}  Medications Ordered in ED Medications - No data to display  ED Course/ Medical Decision Making/ A&P   {   Click here for ABCD2, HEART and other calculatorsREFRESH Note before signing :1}                              Medical Decision Making Amount and/or Complexity of Data Reviewed Labs: ordered. Radiology: ordered.  Risk OTC drugs. Prescription drug management.   ***  {Document critical care time when appropriate:1} {Document review of labs and clinical decision tools ie heart score, Chads2Vasc2 etc:1}  {Document your independent review of radiology images, and any outside records:1} {Document your discussion with family members, caretakers, and  with consultants:1} {Document social determinants of health affecting pt's care:1} {Document your decision making why or why not admission, treatments were needed:1} Final Clinical Impression(s) / ED Diagnoses Final diagnoses:  None    Rx / DC Orders ED Discharge Orders     None

## 2024-03-05 NOTE — H&P (Incomplete)
 History and Physical      [name]:[id number] DOB: 1951/05/17 DOA: 03/05/2024; DOS: 03/05/2024  PCP: Fleet Contras, MD  Patient coming from: home   I have personally briefly reviewed patient's old medical records in [facility]  Chief Complaint: Dysuria  HPI: [name] is a 73 y.o. male with medical history significant for ESBL E. coli UTI, who comes, type 2 diabetes mellitus complicated by diabetic peripheral polyneuropathy, essential hypertension, BPH, anemia of chronic disease associated baseline hemoglobin 11.5-13, who is admitted to [facility] on 03/05/2024 with acute cystitis after presenting from home to [facility] ED complaining of dysuria.   In the patient reports 2 days of dysuria associated with increase in urinary frequency, as well as mild gross hematuria as well as sharp, nonradiating suprapubic tenderness over that timeframe.  Denies any associated new onset flank tenderness.  He notes associated subjective fever, in the absence of chills, full body rigors, or generalized myalgias.  Denies any recent cough, shortness of breath.  He is on a daily baby aspirin at home, but otherwise on no additional blood thinners.  He has a reported history of ESBL E. coli UTI, which is reported to be sensitive to cefepime.  His medical history is also notable for BPH, for which she is on Flomax as an outpatient.    ED Course:  Vital signs in the ED were notable for the following: Afebrile; heart rate in the range of 57-74; systolic blood pressures in the 140s to 180s; respiratory rate 16-18, oxygen saturation 94 to 99% on room air.  Labs were notable for the following: CMP was notable for the following: Potassium 3.2, bicarbonate 24, creatinine 0.99 compared to 1.04 on 01/31/2024, glucose 112, liver enzymes were within normal limits.  BNP 239 compared to 82 on 01/31/2024 and compared to 253 on 01/01/2024.  CBC notable for white blood cell count 12,900 with 61% neutrophils,  1112.7 associated with microcytic and normochromic properties and relative to most recent prior hemoglobin data point of 13 on 01/31/2024, platelet count 305.  INR 1.0.  Urinalysis was associate with a cloudy appearing specimen was notable for greater than 50 white blood cells, moderate leukocyte esterase, many bacteria, and no evidence discussed epithelial cells.  Blood cultures x 2 and urine culture collected prior to initiation of IV antibiotics.  Per my interpretation, EKG in ED demonstrated the following: No EKG performed in the ED today.  Imaging in the ED, per corresponding formal radiology read, was notable for the following: 2 view chest x-ray showed no evidence of acute cardiopulmonary process, including no evidence of infiltrate, edema, effusion, or pneumothorax.  CT renal stone study showed diffuse bladder wall thickening as well as perivesicular edema, consistent with acute cystitis, will demonstrating no evidence of ureteral stone or any evidence of hydronephrosis.  EDP d/w on-call urology, Dr. Dalbert Mayotte, who agreed with antibiotic selection, and conveyed no need for additional urology involvement at this time.    While in the ED, the following were administered: Acetaminophen 1 g p.o. x 1, ibuprofen 600 mg p.o. x 1 dose, oxycodone 5 mg p.o. x 1 dose, cefepime.  Subsequently, the patient was admitted the patient was admitted for further evaluation and management of acute cystitis in the context of a history of ESBL E. coli, with presenting labs also notable for hypokalemia.  ***red   Review of Systems: As per HPI otherwise 10 point review of systems negative.   Past Medical History:  Diagnosis Date  .  Alcohol dependence (HCC) 12/10/2013  . Angioedema of lips 12/24/2013  . Brain tumor (HCC)   . CHF (congestive heart failure) (HCC)    diastolic  . Chronic back pain   . Chronic headache   . CVA (cerebral vascular accident) (HCC)   . Diabetes mellitus   . HOCM (hypertrophic  obstructive cardiomyopathy) (HCC) 02/18/2020  . HTN (hypertension), malignant 09/06/2012  . Hypertension   . Narcotic abuse (HCC)   . Normal cardiac stress test 2009  . TIA (transient ischemic attack) 09/06/2012  . Tobacco use disorder 12/10/2013  . UTI (urinary tract infection)   . Vertigo 09/06/2012    Past Surgical History:  Procedure Laterality Date  . NO PAST SURGERIES      Social History:  reports that he has been smoking cigarettes. He uses smokeless tobacco. He reports that he does not currently use alcohol. He reports that he does not currently use drugs after having used the following drugs: Marijuana, Cocaine, and IV.   No Known Allergies  Family History  Problem Relation Age of Onset  . CAD Father        "heart attack in his 14's"  . Hypertension Father   . Diabetes Brother   . Diabetes Sister   . Hypertension Mother     Family history reviewed and not pertinent    Prior to Admission medications   Medication Sig Start Date End Date Taking? Authorizing Provider  sulfamethoxazole-trimethoprim (BACTRIM DS) 800-160 MG tablet Take 1 tablet by mouth 2 (two) times daily for 10 days. 03/05/24 03/15/24 Yes Rondel Baton, MD  albuterol (PROVENTIL HFA;VENTOLIN HFA) 108 (90 Base) MCG/ACT inhaler Inhale 1-2 puffs into the lungs every 6 (six) hours as needed for wheezing. 09/04/16   Rolland Porter, MD  aspirin EC 81 MG tablet Take 81 mg by mouth every morning.    [provider]  atorvastatin (LIPITOR) 40 MG tablet Take 1 tablet (40 mg total) by mouth daily. 11/12/23   Danford, Earl Lites, MD  carvedilol (COREG) 25 MG tablet Take 1 tablet (25 mg total) by mouth 2 (two) times daily with a meal. 12/04/23   Tyrone Nine, MD  diclofenac Sodium (VOLTAREN) 1 % GEL Apply 2 g topically 4 (four) times daily. 11/12/23   Danford, Earl Lites, MD  gabapentin (NEURONTIN) 600 MG tablet Take 600 mg by mouth 3 (three) times daily. 12/08/23   [provider]  hydrALAZINE  (APRESOLINE) 100 MG tablet Take 1 tablet (100 mg total) by mouth every 8 (eight) hours. 12/04/23   Tyrone Nine, MD  ibuprofen (ADVIL) 800 MG tablet Take 800 mg by mouth 3 (three) times daily as needed. 12/17/23   [provider]  lidocaine (LIDODERM) 5 % Place 1 patch onto the skin daily. Remove & Discard patch within 12 hours or as directed by MD 01/31/24   Linwood Dibbles, MD  metFORMIN (GLUCOPHAGE) 500 MG tablet Take 1 tablet (500 mg total) by mouth 2 (two) times daily. 11/12/23   Danford, Earl Lites, MD  nicotine (NICODERM CQ - DOSED IN MG/24 HOURS) 21 mg/24hr patch Place 1 patch (21 mg total) onto the skin daily. Patient taking differently: Place 21 mg onto the skin daily as needed (smoking cessation). 02/21/20   Rodolph Bong, MD  omeprazole (PRILOSEC) 20 MG capsule Take 1 capsule (20 mg total) by mouth daily. 12/14/22   Arby Barrette, MD  ondansetron (ZOFRAN-ODT) 8 MG disintegrating tablet Take 1 tablet (8 mg total) by mouth every 8 (eight)  hours as needed for nausea or vomiting. 08/19/22   Margarita Grizzle, MD  tamsulosin (FLOMAX) 0.4 MG CAPS capsule Take 1 capsule (0.4 mg total) by mouth daily after supper. 11/12/23   Danford, Earl Lites, MD  traMADol (ULTRAM) 50 MG tablet Take 1 tablet by mouth twice a day as needed for severe pains 06/04/21     verapamil (CALAN-SR) 180 MG CR tablet Take 2 tablets (360 mg total) by mouth daily. 11/12/23   Alberteen Sam, MD     Objective    Physical Exam: Vitals:   03/05/24 1700 03/05/24 1830 03/05/24 1933 03/05/24 2141  BP: (!) 159/54 118/64  (!) 172/52  Pulse: 69 62  (!) 57  Resp: 18 18  16   Temp:   98.5 F (36.9 C)   TempSrc:   Oral   SpO2: 93% 94%  96%  Weight:      Height:        General: appears to be stated age; alert, oriented Skin: warm, dry, no rash Head:  AT/[place] Mouth:  Oral mucosa membranes appear moist, normal dentition Neck: supple; trachea midline Heart:  RRR; did not appreciate any M/R/G Lungs: CTAB, did  not appreciate any wheezes, rales, or rhonchi Abdomen: + BS; soft, ND, NT Vascular: 2+ pedal pulses b/l; 2+ radial pulses b/l Extremities: no peripheral edema, no muscle wasting Neuro: strength and sensation intact in upper and lower extremities b/l    Labs on Admission: I have personally reviewed following labs and imaging studies  CBC: Recent Labs  Lab 03/05/24 1523  WBC 12.9*  NEUTROABS 8.1*  HGB 12.7*  HCT 40.5  MCV 74.4*  PLT 305   Basic Metabolic Panel: Recent Labs  Lab 03/05/24 1523  NA 139  K 3.2*  CL 105  CO2 24  GLUCOSE 112*  BUN 15  CREATININE 0.99  CALCIUM 9.1   GFR: Estimated Creatinine Clearance: 68.4 mL/min (by C-G formula based on SCr of 0.99 mg/dL). Liver Function Tests: Recent Labs  Lab 03/05/24 1523  AST 22  ALT 17  ALKPHOS 53  BILITOT 0.8  PROT 7.0  ALBUMIN 3.5   No results for input(s): "LIPASE", "AMYLASE" in the last 168 hours. No results for input(s): "AMMONIA" in the last 168 hours. Coagulation Profile: Recent Labs  Lab 03/05/24 1523  INR 1.0   Cardiac Enzymes: Recent Labs  Lab 03/05/24 2059  CKTOTAL 64   BNP (last 3 results) No results for input(s): "PROBNP" in the last 8760 hours. HbA1C: No results for input(s): "HGBA1C" in the last 72 hours. CBG: No results for input(s): "GLUCAP" in the last 168 hours. Lipid Profile: No results for input(s): "CHOL", "HDL", "LDLCALC", "TRIG", "CHOLHDL", "LDLDIRECT" in the last 72 hours. Thyroid Function Tests: No results for input(s): "TSH", "T4TOTAL", "FREET4", "T3FREE", "THYROIDAB" in the last 72 hours. Anemia Panel: No results for input(s): "VITAMINB12", "FOLATE", "FERRITIN", "TIBC", "IRON", "RETICCTPCT" in the last 72 hours. Urine analysis:    Component Value Date/Time   COLORURINE RED (A) 03/05/2024 1600   APPEARANCEUR CLOUDY (A) 03/05/2024 1600   LABSPEC 1.013 03/05/2024 1600   PHURINE 5.0 03/05/2024 1600   GLUCOSEU NEGATIVE 03/05/2024 1600   HGBUR LARGE (A) 03/05/2024  1600   BILIRUBINUR NEGATIVE 03/05/2024 1600   KETONESUR NEGATIVE 03/05/2024 1600   PROTEINUR 100 (A) 03/05/2024 1600   UROBILINOGEN 2.0 (H) 08/06/2015 1209   NITRITE NEGATIVE 03/05/2024 1600   LEUKOCYTESUR MODERATE (A) 03/05/2024 1600    Radiological Exams on Admission: CT Renal Stone Study Result Date: 03/05/2024  CLINICAL DATA:  Abdominal/flank pain, stone suspected EXAM: CT ABDOMEN AND PELVIS WITHOUT CONTRAST TECHNIQUE: Multidetector CT imaging of the abdomen and pelvis was performed following the standard protocol without IV contrast. RADIATION DOSE REDUCTION: This exam was performed according to the departmental dose-optimization program which includes automated exposure control, adjustment of the mA and/or kV according to patient size and/or use of iterative reconstruction technique. COMPARISON:  CT 12/02/2023 FINDINGS: Lower chest: Small hiatal hernia. Scattered small pulmonary cysts are unchanged from prior. No acute airspace disease. Hepatobiliary: Tiny hypodensity in the left lobe of the liver series 2, image 18, unchanged from prior exam. New hepatic abnormality Gallbladder physiologically distended, no calcified stone. No biliary dilatation. Pancreas: No ductal dilatation or inflammation. Spleen: Unremarkable unenhanced appearance. Adrenals/Urinary Tract: Normal adrenal glands. No hydronephrosis or renal calculi. Decompressed ureters. There is faint symmetric bilateral perinephric edema. Bladder appears trabeculated. Diffuse bladder wall thickening with perivesicular edema. There is air within the urinary bladder and possibly bladder wall. There is a large superior right bladder diverticulum as well as smaller left posterior bladder diverticulum. Stomach/Bowel: Small hiatal hernia. No bowel obstruction or inflammation. Colonic diverticulosis without diverticulitis. Moderate colonic stool burden. The appendix is not definitively seen. Vascular/Lymphatic: Aortic and branch atherosclerosis. No  aneurysm. No portal venous gas. No enlarged lymph nodes. Reproductive: Prostate is unremarkable. Other: Fat in both inguinal canals.  No free fluid or free air. Musculoskeletal: There are no acute or suspicious osseous abnormalities. The bones are under mineralized IMPRESSION: 1. Diffuse bladder wall thickening with perivesicular edema, likely representing cystitis. Air within the urinary bladder and possibly bladder wall, suspicious for emphysematous cystitis. Bladder diverticula again seen. 2. No hydronephrosis or renal calculi. 3. Colonic diverticulosis without diverticulitis. 4. Small hiatal hernia. Aortic Atherosclerosis (ICD10-I70.0). Electronically Signed   By: Narda Rutherford M.D.   On: 03/05/2024 20:50   DG Chest 2 View Result Date: 03/05/2024 CLINICAL DATA:  Acute left ankle swelling. EXAM: CHEST - 2 VIEW COMPARISON:  January 31, 2024. FINDINGS: The heart size and mediastinal contours are within normal limits. Both lungs are clear. The visualized skeletal structures are unremarkable. IMPRESSION: No active cardiopulmonary disease. Electronically Signed   By: Lupita Raider M.D.   On: 03/05/2024 15:44      Assessment/Plan    Principal Problem:   Acute cystitis  ***        #) Acute cystitis: Diagnosis on the basis of presenting 2 days of new onset dysuria associated increase in urinary frequency, mild gross hematuria, new onset suprapubic tenderness, subjective fever, with urinalysis associated with cloudy appearing specimen and significant pyuria as well as moderate leukocyte esterase, many bacteria, and no evidence of squamous epithelial cells to suggest a contaminated specimen, while CT renal stone showed diffuse bladder wall thickening as well as perivesicular edema consistent with acute cystitis, in the absence of any evidence of ureteral  stone                  ***                  ***                  ***                  ***                 ***                  ***                  ***                  ***                  ***                  ***                  ***                 ***  DVT prophylaxis: SCD's ***  Code Status: Full code*** Family Communication: none*** Disposition Plan: Per Rounding Team Consults called: EDP d/w on-call urology, Dr. Dalbert Mayotte, who agreed with antibiotic selection, and conveyed no need for additional urology involvement at this time.  ;  Admission status: ***    I SPENT GREATER THAN 75 *** MINUTES IN CLINICAL CARE TIME/MEDICAL DECISION-MAKING IN COMPLETING THIS ADMISSION.     Chaney Born Delshon Blanchfield DO [facility] From 7PM - 7AM   03/05/2024, 10:07 PM   ***

## 2024-03-06 DIAGNOSIS — N3080 Other cystitis without hematuria: Secondary | ICD-10-CM | POA: Diagnosis not present

## 2024-03-06 DIAGNOSIS — B9629 Other Escherichia coli [E. coli] as the cause of diseases classified elsewhere: Secondary | ICD-10-CM | POA: Diagnosis present

## 2024-03-06 DIAGNOSIS — K219 Gastro-esophageal reflux disease without esophagitis: Secondary | ICD-10-CM | POA: Diagnosis present

## 2024-03-06 DIAGNOSIS — N3001 Acute cystitis with hematuria: Secondary | ICD-10-CM | POA: Diagnosis not present

## 2024-03-06 DIAGNOSIS — D638 Anemia in other chronic diseases classified elsewhere: Secondary | ICD-10-CM | POA: Diagnosis present

## 2024-03-06 LAB — COMPREHENSIVE METABOLIC PANEL
ALT: 18 U/L (ref 0–44)
AST: 24 U/L (ref 15–41)
Albumin: 3 g/dL — ABNORMAL LOW (ref 3.5–5.0)
Alkaline Phosphatase: 52 U/L (ref 38–126)
Anion gap: 8 (ref 5–15)
BUN: 16 mg/dL (ref 8–23)
CO2: 24 mmol/L (ref 22–32)
Calcium: 8.8 mg/dL — ABNORMAL LOW (ref 8.9–10.3)
Chloride: 107 mmol/L (ref 98–111)
Creatinine, Ser: 1.09 mg/dL (ref 0.61–1.24)
GFR, Estimated: >60 mL/min (ref >60)
Glucose, Bld: 111 mg/dL — ABNORMAL HIGH (ref 70–99)
Potassium: 3.7 mmol/L (ref 3.5–5.1)
Sodium: 139 mmol/L (ref 135–145)
Total Bilirubin: 0.6 mg/dL (ref 0.0–1.2)
Total Protein: 6.4 g/dL — ABNORMAL LOW (ref 6.5–8.1)

## 2024-03-06 LAB — CBC WITH DIFFERENTIAL/PLATELET
Abs Immature Granulocytes: 0.04 10*3/uL (ref 0.00–0.07)
Basophils Absolute: 0.1 10*3/uL (ref 0.0–0.1)
Basophils Relative: 1 %
Eosinophils Absolute: 0.6 10*3/uL — ABNORMAL HIGH (ref 0.0–0.5)
Eosinophils Relative: 6 %
HCT: 40.5 % (ref 39.0–52.0)
Hemoglobin: 12.1 g/dL — ABNORMAL LOW (ref 13.0–17.0)
Immature Granulocytes: 0 %
Lymphocytes Relative: 24 %
Lymphs Abs: 2.4 10*3/uL (ref 0.7–4.0)
MCH: 22.6 pg — ABNORMAL LOW (ref 26.0–34.0)
MCHC: 29.9 g/dL — ABNORMAL LOW (ref 30.0–36.0)
MCV: 75.7 fL — ABNORMAL LOW (ref 80.0–100.0)
Monocytes Absolute: 0.8 10*3/uL (ref 0.1–1.0)
Monocytes Relative: 8 %
Neutro Abs: 6.2 10*3/uL (ref 1.7–7.7)
Neutrophils Relative %: 61 %
Platelets: 316 10*3/uL (ref 150–400)
RBC: 5.35 MIL/uL (ref 4.22–5.81)
RDW: 16.3 % — ABNORMAL HIGH (ref 11.5–15.5)
WBC: 10.1 10*3/uL (ref 4.0–10.5)
nRBC: 0 % (ref 0.0–0.2)

## 2024-03-06 LAB — MAGNESIUM: Magnesium: 1.5 mg/dL — ABNORMAL LOW (ref 1.7–2.4)

## 2024-03-06 LAB — CBG MONITORING, ED
Glucose-Capillary: 115 mg/dL — ABNORMAL HIGH (ref 70–99)
Glucose-Capillary: 115 mg/dL — ABNORMAL HIGH (ref 70–99)

## 2024-03-06 MED ORDER — MAGNESIUM SULFATE 2 GM/50ML IV SOLN
2.0000 g | Freq: Once | INTRAVENOUS | Status: AC
  Administered 2024-03-06: 2 g via INTRAVENOUS
  Filled 2024-03-06: qty 50

## 2024-03-06 MED ORDER — SULFAMETHOXAZOLE-TRIMETHOPRIM 800-160 MG PO TABS: 1.0000 | ORAL_TABLET | Freq: Two times a day (BID) | ORAL | 0 refills | Status: AC

## 2024-03-06 NOTE — Care Management CC44 (Signed)
 Condition Code 44 Documentation Completed  Patient Details  Name: [name] MRN: [id number] Date of Birth: 1951-03-07   Condition Code 44 given:  Yes Patient signature on Condition Code 44 notice:  Yes Documentation of 2 MD's agreement:  Yes Code 44 added to claim:  Yes    Princella Ion, LCSW 03/06/2024, 3:33 PM

## 2024-03-06 NOTE — ED Notes (Signed)
 Pt ambulated with writer around the ER without any difficulty. Pt maintained oxygen saturation at 100%; Pule only went up 6 points.

## 2024-03-06 NOTE — Discharge Summary (Signed)
 Physician Discharge Summary   Patient: [name] MRN: [id number] DOB: Jun 27, 1951  Admit date:     03/05/2024  Discharge date: 03/06/24  Discharge Physician: Alberteen Sam   PCP: Fleet Contras, MD     Recommendations at discharge:  Follow up with PCP Dr. Concepcion Elk in 1 week for complicated UTI     Discharge Diagnoses: Principal Problem:   Emphysematous cystitis Active Problems:   DM2 (diabetes mellitus, type 2) (HCC)   BPH (benign prostatic hyperplasia)   Hypokalemia   Essential hypertension   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HCC)   GERD (gastroesophageal reflux disease)   Anemia of chronic disease   Urinary tract infection due to extended-spectrum beta lactamase (ESBL) producing Escherichia coli      Hospital Course: 73 y.o. M with dCHF, HOCM, DM, HTN, hx CVA and chronic LLE weakness and polysubstance abuse who presented with tingling in the hands and feet.  Also noted suprapubic pain, dysuria and hematuria.       Emphysematous cystitis Patient admitted on IV antibiotics  Overnight, he was voiding well without hematuria.  In the morning, he was eating well, HR and RR normal, afebrile, mentating at baseline, ambulating well.  Discussed again with Urology and if he clinically improved with IV antibiotics, no further intervention planned and he may discharge with oral antibiotics.  Discussed with ID and elected to discharge on Bactrim.   Patient has PCP follow up arranged and was told return precautions.  Discharged on 10 days Bactrim   Tingling of hands and feet, bilateral Strength at baseline.  No focal deficits to suggest stroke.  Electrolytes normal, B12 normal.  Recent TSH normal.   - If persistent, recommend Neurology follow up         [facility] Controlled Substances Registry was reviewed for this patient prior to discharge.      Disposition: Home Diet recommendation:  Cardiac diet  DISCHARGE  MEDICATION: Allergies as of 03/06/2024   No Known Allergies      Medication List     PAUSE taking these medications    metFORMIN 500 MG tablet Wait to take this until: March 17, 2024 Commonly known as: GLUCOPHAGE Take 1 tablet (500 mg total) by mouth 2 (two) times daily.       STOP taking these medications    ciprofloxacin 500 MG tablet Commonly known as: CIPRO       TAKE these medications    albuterol 108 (90 Base) MCG/ACT inhaler Commonly known as: VENTOLIN HFA Inhale 1-2 puffs into the lungs every 6 (six) hours as needed for wheezing.   aspirin EC 81 MG tablet Take 81 mg by mouth every morning.   atorvastatin 40 MG tablet Commonly known as: LIPITOR Take 1 tablet (40 mg total) by mouth daily.   carvedilol 25 MG tablet Commonly known as: COREG Take 1 tablet (25 mg total) by mouth 2 (two) times daily with a meal.   diclofenac Sodium 1 % Gel Commonly known as: VOLTAREN Apply 2 g topically 4 (four) times daily.   gabapentin 600 MG tablet Commonly known as: NEURONTIN Take 600 mg by mouth 3 (three) times daily.   hydrALAZINE 100 MG tablet Commonly known as: APRESOLINE Take 1 tablet (100 mg total) by mouth every 8 (eight) hours.   ibuprofen 800 MG tablet Commonly known as: ADVIL Take 800 mg by mouth 3 (three) times daily as needed.   lidocaine 5 % Commonly known as: Lidoderm Place 1 patch onto the skin daily.  Remove & Discard patch within 12 hours or as directed by MD   nicotine 21 mg/24hr patch Commonly known as: NICODERM CQ - dosed in mg/24 hours Place 1 patch (21 mg total) onto the skin daily. What changed:  when to take this reasons to take this   omeprazole 20 MG capsule Commonly known as: PRILOSEC Take 1 capsule (20 mg total) by mouth daily.   ondansetron 8 MG disintegrating tablet Commonly known as: ZOFRAN-ODT Take 1 tablet (8 mg total) by mouth every 8 (eight) hours as needed for nausea or vomiting.   sulfamethoxazole-trimethoprim  800-160 MG tablet Commonly known as: BACTRIM DS Take 1 tablet by mouth 2 (two) times daily for 10 days.   tamsulosin 0.4 MG Caps capsule Commonly known as: FLOMAX Take 1 capsule (0.4 mg total) by mouth daily after supper.   traMADol 50 MG tablet Commonly known as: ULTRAM Take 1 tablet by mouth twice a day as needed for severe pains   verapamil 180 MG CR tablet Commonly known as: CALAN-SR Take 2 tablets (360 mg total) by mouth daily.        Follow-up Information     Fleet Contras, MD. Schedule an appointment as soon as possible for a visit in 1 week(s).   Specialty: Internal Medicine Contact information: [address] [place] [address] [phone]                 Discharge Instructions     Discharge instructions   Complete by: As directed    **IMPORTANT DISCHARGE INSTRUCTIONS**   From Dr. Maryfrances Bunnell:   Increase activity slowly   Complete by: As directed        Discharge Exam: Filed Weights   03/05/24 1342  Weight: 72.6 kg    General: Pt is alert, awake, not in acute distress Cardiovascular: RRR, nl S1-S2, no murmurs appreciated.   No LE edema.   Respiratory: Normal respiratory rate and rhythm.  CTAB without rales or wheezes. Abdominal: Abdomen soft, mild suprapubic and right lower quadrant tenderness.  No distension or HSM.   Neuro/Psych: Cranial nerves 3-12 intact.  Strength symmetric in upper and lower extremities, 5-/5, gait shuffling but at baseline. Oriented to person place and time.  Sensation intact to light touch in upper and lower extremities bilaterally, maybe some tingling paresthesias..  Judgment and insight appear normal.   Condition at discharge: fair  The results of significant diagnostics from this hospitalization (including imaging, microbiology, ancillary and laboratory) are listed below for reference.   Imaging Studies: CT Renal Stone Study Result Date: 03/05/2024 CLINICAL DATA:  Abdominal/flank pain, stone suspected EXAM:  CT ABDOMEN AND PELVIS WITHOUT CONTRAST TECHNIQUE: Multidetector CT imaging of the abdomen and pelvis was performed following the standard protocol without IV contrast. RADIATION DOSE REDUCTION: This exam was performed according to the departmental dose-optimization program which includes automated exposure control, adjustment of the mA and/or kV according to patient size and/or use of iterative reconstruction technique. COMPARISON:  CT 12/02/2023 FINDINGS: Lower chest: Small hiatal hernia. Scattered small pulmonary cysts are unchanged from prior. No acute airspace disease. Hepatobiliary: Tiny hypodensity in the left lobe of the liver series 2, image 18, unchanged from prior exam. New hepatic abnormality Gallbladder physiologically distended, no calcified stone. No biliary dilatation. Pancreas: No ductal dilatation or inflammation. Spleen: Unremarkable unenhanced appearance. Adrenals/Urinary Tract: Normal adrenal glands. No hydronephrosis or renal calculi. Decompressed ureters. There is faint symmetric bilateral perinephric edema. Bladder appears trabeculated. Diffuse bladder wall thickening with perivesicular edema. There is air  within the urinary bladder and possibly bladder wall. There is a large superior right bladder diverticulum as well as smaller left posterior bladder diverticulum. Stomach/Bowel: Small hiatal hernia. No bowel obstruction or inflammation. Colonic diverticulosis without diverticulitis. Moderate colonic stool burden. The appendix is not definitively seen. Vascular/Lymphatic: Aortic and branch atherosclerosis. No aneurysm. No portal venous gas. No enlarged lymph nodes. Reproductive: Prostate is unremarkable. Other: Fat in both inguinal canals.  No free fluid or free air. Musculoskeletal: There are no acute or suspicious osseous abnormalities. The bones are under mineralized IMPRESSION: 1. Diffuse bladder wall thickening with perivesicular edema, likely representing cystitis. Air within the  urinary bladder and possibly bladder wall, suspicious for emphysematous cystitis. Bladder diverticula again seen. 2. No hydronephrosis or renal calculi. 3. Colonic diverticulosis without diverticulitis. 4. Small hiatal hernia. Aortic Atherosclerosis (ICD10-I70.0). Electronically Signed   By: Narda Rutherford M.D.   On: 03/05/2024 20:50   DG Chest 2 View Result Date: 03/05/2024 CLINICAL DATA:  Acute left ankle swelling. EXAM: CHEST - 2 VIEW COMPARISON:  January 31, 2024. FINDINGS: The heart size and mediastinal contours are within normal limits. Both lungs are clear. The visualized skeletal structures are unremarkable. IMPRESSION: No active cardiopulmonary disease. Electronically Signed   By: Lupita Raider M.D.   On: 03/05/2024 15:44    Microbiology: Results for orders placed or performed during the hospital encounter of 03/05/24  Blood culture (routine x 2)     Status: None (Preliminary result)   Collection Time: 03/05/24  8:50 PM   Specimen: BLOOD  Result Value Ref Range Status   Specimen Description   Final    BLOOD BLOOD LEFT WRIST Performed at [facility], [address] [place], [place] [address]    Special Requests   Final    BOTTLES DRAWN AEROBIC AND ANAEROBIC Blood Culture results may not be optimal due to an inadequate volume of blood received in culture bottles Performed at [facility], [address] [place], [place] [address]    Culture   Final    NO GROWTH < 12 HOURS Performed at [facility], [address] [place], [place] [address]    Report Status PENDING  Incomplete  Blood culture (routine x 2)     Status: None (Preliminary result)   Collection Time: 03/05/24  8:54 PM   Specimen: BLOOD  Result Value Ref Range Status   Specimen Description   Final    BLOOD BLOOD RIGHT HAND Performed at [facility], [address] [place], [place] [address]    Special Requests   Final    BOTTLES DRAWN AEROBIC AND  ANAEROBIC Blood Culture adequate volume Performed at [facility], [address], [place], [place] [address]    Culture   Final    NO GROWTH < 12 HOURS Performed at [facility], [address] [place], [place] [address]    Report Status PENDING  Incomplete    Labs: CBC: Recent Labs  Lab 03/05/24 1523 03/06/24 0540  WBC 12.9* 10.1  NEUTROABS 8.1* 6.2  HGB 12.7* 12.1*  HCT 40.5 40.5  MCV 74.4* 75.7*  PLT 305 316   Basic Metabolic Panel: Recent Labs  Lab 03/05/24 1523 03/06/24 0540  NA 139 139  K 3.2* 3.7  CL 105 107  CO2 24 24  GLUCOSE 112* 111*  BUN 15 16  CREATININE 0.99 1.09  CALCIUM 9.1 8.8*  MG  --  1.5*   Liver  Function Tests: Recent Labs  Lab 03/05/24 1523 03/06/24 0540  AST 22 24  ALT 17 18  ALKPHOS 53 52  BILITOT 0.8 0.6  PROT 7.0 6.4*  ALBUMIN 3.5 3.0*   CBG: Recent Labs  Lab 03/06/24 0914 03/06/24 1214  GLUCAP 115* 115*    Discharge time spent: approximately 45 minutes spent on discharge counseling, evaluation of patient on day of discharge, and coordination of discharge planning with nursing, social work, pharmacy and case management  Signed: Alberteen Sam, MD [facility] 03/06/2024

## 2024-03-06 NOTE — Care Management Obs Status (Signed)
 MEDICARE OBSERVATION STATUS NOTIFICATION   Patient Details  Name: [name] MRN: [id number] Date of Birth: June 21, 1951   Medicare Observation Status Notification Given:  Yes    Princella Ion, LCSW 03/06/2024, 3:32 PM

## 2024-03-07 LAB — BLOOD CULTURE ID PANEL (REFLEXED) - BCID2

## 2024-03-08 LAB — CULTURE, BLOOD (ROUTINE X 2)

## 2024-03-08 LAB — URINE CULTURE: Culture: >=100000 — AB

## 2024-03-11 LAB — CULTURE, BLOOD (ROUTINE X 2)
Culture: NO GROWTH
Special Requests: ADEQUATE

## 2024-03-25 ENCOUNTER — Emergency Department (HOSPITAL_COMMUNITY)

## 2024-03-25 ENCOUNTER — Other Ambulatory Visit: Payer: Self-pay

## 2024-03-25 ENCOUNTER — Encounter (HOSPITAL_COMMUNITY): Payer: Self-pay

## 2024-03-25 ENCOUNTER — Emergency Department (HOSPITAL_COMMUNITY)
Admission: EM | Admit: 2024-03-25 | Discharge: 2024-03-25 | Disposition: A | Discharge status: Home / Self Care | Attending: Emergency Medicine | Admitting: Emergency Medicine

## 2024-03-25 DIAGNOSIS — E876 Hypokalemia: Secondary | ICD-10-CM | POA: Diagnosis not present

## 2024-03-25 DIAGNOSIS — D72829 Elevated white blood cell count, unspecified: Secondary | ICD-10-CM | POA: Diagnosis not present

## 2024-03-25 DIAGNOSIS — Z79899 Other long term (current) drug therapy: Secondary | ICD-10-CM | POA: Diagnosis not present

## 2024-03-25 DIAGNOSIS — Z8673 Personal history of transient ischemic attack (TIA), and cerebral infarction without residual deficits: Secondary | ICD-10-CM | POA: Insufficient documentation

## 2024-03-25 DIAGNOSIS — R202 Paresthesia of skin: Secondary | ICD-10-CM | POA: Diagnosis not present

## 2024-03-25 DIAGNOSIS — I10 Essential (primary) hypertension: Secondary | ICD-10-CM | POA: Diagnosis not present

## 2024-03-25 DIAGNOSIS — D649 Anemia, unspecified: Secondary | ICD-10-CM | POA: Diagnosis not present

## 2024-03-25 DIAGNOSIS — Z7982 Long term (current) use of aspirin: Secondary | ICD-10-CM | POA: Insufficient documentation

## 2024-03-25 DIAGNOSIS — Z7984 Long term (current) use of oral hypoglycemic drugs: Secondary | ICD-10-CM | POA: Insufficient documentation

## 2024-03-25 DIAGNOSIS — R011 Cardiac murmur, unspecified: Secondary | ICD-10-CM | POA: Insufficient documentation

## 2024-03-25 DIAGNOSIS — E119 Type 2 diabetes mellitus without complications: Secondary | ICD-10-CM | POA: Insufficient documentation

## 2024-03-25 DIAGNOSIS — R0781 Pleurodynia: Secondary | ICD-10-CM | POA: Insufficient documentation

## 2024-03-25 LAB — TROPONIN I (HIGH SENSITIVITY)
Troponin I (High Sensitivity): 38 ng/L — ABNORMAL HIGH (ref <18)
Troponin I (High Sensitivity): 25 ng/L — ABNORMAL HIGH (ref <18)

## 2024-03-25 LAB — CBC WITH DIFFERENTIAL/PLATELET
Abs Immature Granulocytes: 0.03 10*3/uL (ref 0.00–0.07)
Basophils Absolute: 0.1 10*3/uL (ref 0.0–0.1)
Basophils Relative: 1 %
Eosinophils Absolute: 0.4 10*3/uL (ref 0.0–0.5)
Eosinophils Relative: 3 %
HCT: 40.2 % (ref 39.0–52.0)
Hemoglobin: 12.2 g/dL — ABNORMAL LOW (ref 13.0–17.0)
Immature Granulocytes: 0 %
Lymphocytes Relative: 29 %
Lymphs Abs: 3.1 10*3/uL (ref 0.7–4.0)
MCH: 23 pg — ABNORMAL LOW (ref 26.0–34.0)
MCHC: 30.3 g/dL (ref 30.0–36.0)
MCV: 75.8 fL — ABNORMAL LOW (ref 80.0–100.0)
Monocytes Absolute: 0.7 10*3/uL (ref 0.1–1.0)
Monocytes Relative: 7 %
Neutro Abs: 6.4 10*3/uL (ref 1.7–7.7)
Neutrophils Relative %: 60 %
Platelets: 263 10*3/uL (ref 150–400)
RBC: 5.3 MIL/uL (ref 4.22–5.81)
RDW: 15.7 % — ABNORMAL HIGH (ref 11.5–15.5)
WBC: 10.7 10*3/uL — ABNORMAL HIGH (ref 4.0–10.5)
nRBC: 0 % (ref 0.0–0.2)

## 2024-03-25 LAB — COMPREHENSIVE METABOLIC PANEL WITH GFR
ALT: 22 U/L (ref 0–44)
AST: 22 U/L (ref 15–41)
Albumin: 3.6 g/dL (ref 3.5–5.0)
Alkaline Phosphatase: 52 U/L (ref 38–126)
Anion gap: 8 (ref 5–15)
BUN: 12 mg/dL (ref 8–23)
CO2: 24 mmol/L (ref 22–32)
Calcium: 9 mg/dL (ref 8.9–10.3)
Chloride: 106 mmol/L (ref 98–111)
Creatinine, Ser: 1.06 mg/dL (ref 0.61–1.24)
GFR, Estimated: >60 mL/min (ref >60)
Glucose, Bld: 112 mg/dL — ABNORMAL HIGH (ref 70–99)
Potassium: 3.3 mmol/L — ABNORMAL LOW (ref 3.5–5.1)
Sodium: 138 mmol/L (ref 135–145)
Total Bilirubin: 0.5 mg/dL (ref 0.0–1.2)
Total Protein: 7.3 g/dL (ref 6.5–8.1)

## 2024-03-25 LAB — CBG MONITORING, ED: Glucose-Capillary: 106 mg/dL — ABNORMAL HIGH (ref 70–99)

## 2024-03-25 MED ORDER — FENTANYL CITRATE PF 50 MCG/ML IJ SOSY
12.5000 ug | PREFILLED_SYRINGE | Freq: Once | INTRAMUSCULAR | Status: AC
  Administered 2024-03-25: 12.5 ug via INTRAVENOUS
  Filled 2024-03-25: qty 1

## 2024-03-25 MED ORDER — POTASSIUM CHLORIDE CRYS ER 20 MEQ PO TBCR
40.0000 meq | EXTENDED_RELEASE_TABLET | Freq: Once | ORAL | Status: AC
  Administered 2024-03-25: 40 meq via ORAL
  Filled 2024-03-25: qty 2

## 2024-03-25 MED ORDER — HYDROCODONE-ACETAMINOPHEN 5-325 MG PO TABS: 2.0000 | ORAL_TABLET | ORAL | 0 refills | Status: AC | PRN

## 2024-03-25 MED ORDER — LIDOCAINE 5 % EX PTCH
1.0000 | MEDICATED_PATCH | CUTANEOUS | Status: DC
Start: 2024-03-25 — End: 2024-03-25
  Administered 2024-03-25: 1 via TRANSDERMAL
  Filled 2024-03-25: qty 1

## 2024-03-25 NOTE — ED Notes (Signed)
 Patient transported to CT

## 2024-03-25 NOTE — ED Provider Notes (Signed)
 [place] EMERGENCY DEPARTMENT AT [facility] Provider Note   CSN: [id number] Arrival date & time: 03/25/24  0126     History  Chief Complaint  Patient presents with   Pain    [name] is a 73 y.o. male with history of HOCM, EtOH, CVA, DM T2, hypertension without anticoagulation but on daily aspirin who presents with concern for numbness in his hands bilaterally x 2 months as well as pain in the left ribs after he had a series of falls 2 months ago.  States that pain is persistent and numbness in hands is progressive prompting ED visit tonight.  No neck pain.  HPI     Home Medications Prior to Admission medications   Medication Sig Start Date End Date Taking? Authorizing Provider  albuterol (PROVENTIL HFA;VENTOLIN HFA) 108 (90 Base) MCG/ACT inhaler Inhale 1-2 puffs into the lungs every 6 (six) hours as needed for wheezing. 09/04/16   Rolland Porter, MD  aspirin EC 81 MG tablet Take 81 mg by mouth every morning.    [provider]  atorvastatin (LIPITOR) 40 MG tablet Take 1 tablet (40 mg total) by mouth daily. 11/12/23   Danford, Earl Lites, MD  carvedilol (COREG) 25 MG tablet Take 1 tablet (25 mg total) by mouth 2 (two) times daily with a meal. 12/04/23   Tyrone Nine, MD  diclofenac Sodium (VOLTAREN) 1 % GEL Apply 2 g topically 4 (four) times daily. 11/12/23   Danford, Earl Lites, MD  gabapentin (NEURONTIN) 600 MG tablet Take 600 mg by mouth 3 (three) times daily. 12/08/23   [provider]  hydrALAZINE (APRESOLINE) 100 MG tablet Take 1 tablet (100 mg total) by mouth every 8 (eight) hours. 12/04/23   Tyrone Nine, MD  ibuprofen (ADVIL) 800 MG tablet Take 800 mg by mouth 3 (three) times daily as needed. 12/17/23   [provider]  lidocaine (LIDODERM) 5 % Place 1 patch onto the skin daily. Remove & Discard patch within 12 hours or as directed by MD 01/31/24   Linwood Dibbles, MD  metFORMIN (GLUCOPHAGE) 500 MG tablet Take 1 tablet (500 mg total)  by mouth 2 (two) times daily. 11/12/23   Danford, Earl Lites, MD  nicotine (NICODERM CQ - DOSED IN MG/24 HOURS) 21 mg/24hr patch Place 1 patch (21 mg total) onto the skin daily. Patient taking differently: Place 21 mg onto the skin daily as needed (smoking cessation). 02/21/20   Rodolph Bong, MD  omeprazole (PRILOSEC) 20 MG capsule Take 1 capsule (20 mg total) by mouth daily. 12/14/22   Arby Barrette, MD  ondansetron (ZOFRAN-ODT) 8 MG disintegrating tablet Take 1 tablet (8 mg total) by mouth every 8 (eight) hours as needed for nausea or vomiting. 08/19/22   Margarita Grizzle, MD  tamsulosin (FLOMAX) 0.4 MG CAPS capsule Take 1 capsule (0.4 mg total) by mouth daily after supper. 11/12/23   Danford, Earl Lites, MD  traMADol (ULTRAM) 50 MG tablet Take 1 tablet by mouth twice a day as needed for severe pains 06/04/21     verapamil (CALAN-SR) 180 MG CR tablet Take 2 tablets (360 mg total) by mouth daily. 11/12/23   Danford, Earl Lites, MD      Allergies    Patient has no known allergies.    Review of Systems   Review of Systems  Musculoskeletal:        Pain over right ribs after a fall 2 months ago.   Neurological:  Positive for numbness.  Numbness in bilateral hands x 2 months    Physical Exam Updated Vital Signs BP (!) 184/48   Pulse 63   Temp 99.8 F (37.7 C) (Oral)   Resp 16   Ht 5\' 9"  (1.753 m)   Wt 72.6 kg   SpO2 97%   BMI 23.63 kg/m  Physical Exam Vitals and nursing note reviewed.  Constitutional:      Appearance: He is not ill-appearing or toxic-appearing.  HENT:     Head: Normocephalic and atraumatic.     Nose: Nose normal.     Mouth/Throat:     Mouth: Mucous membranes are moist.     Dentition: Abnormal dentition.     Pharynx: No oropharyngeal exudate or posterior oropharyngeal erythema.  Eyes:     General: Lids are normal. Vision grossly intact.        Right eye: No discharge.        Left eye: No discharge.     Extraocular Movements: Extraocular  movements intact.     Conjunctiva/sclera: Conjunctivae normal.     Pupils: Pupils are equal, round, and reactive to light.  Neck:     Trachea: Trachea and phonation normal.  Cardiovascular:     Rate and Rhythm: Normal rate and regular rhythm.     Pulses: Normal pulses.     Heart sounds: Murmur heard.     Systolic murmur is present.  Pulmonary:     Effort: Pulmonary effort is normal. No respiratory distress.     Breath sounds: Normal breath sounds. No wheezing or rales.  Chest:     Chest wall: Tenderness present. No mass, lacerations, deformity, swelling, crepitus or edema.    Abdominal:     General: Bowel sounds are normal. There is no distension.     Palpations: Abdomen is soft.     Tenderness: There is no abdominal tenderness. There is no right CVA tenderness, left CVA tenderness, guarding or rebound.  Musculoskeletal:        General: No deformity.     Right shoulder: Normal.     Left shoulder: Normal.     Right upper arm: Normal.     Left upper arm: Normal.     Right elbow: Normal.     Left elbow: Normal.     Right forearm: Normal.     Left forearm: Normal.     Right wrist: Normal.     Left wrist: Normal.     Right hand: Normal.     Left hand: Normal.     Cervical back: Normal, normal range of motion and neck supple. No bony tenderness. No muscular tenderness.     Thoracic back: Normal. No bony tenderness.     Lumbar back: Normal. No bony tenderness.     Right lower leg: No edema.     Left lower leg: No edema.  Lymphadenopathy:     Cervical: No cervical adenopathy.  Skin:    General: Skin is warm and dry.     Capillary Refill: Capillary refill takes less than 2 seconds.     Findings: No bruising or ecchymosis.  Neurological:     General: No focal deficit present.     Mental Status: He is alert and oriented to person, place, and time. Mental status is at baseline.     GCS: GCS eye subscore is 4. GCS verbal subscore is 5. GCS motor subscore is 6.     Cranial Nerves:  Cranial nerves 2-12 are intact.     Sensory:  Sensation is intact.     Motor: Motor function is intact.     Coordination: Coordination is intact.     Comments: Gait deferred for patient's  Psychiatric:        Mood and Affect: Mood normal.     ED Results / Procedures / Treatments   Labs (all labs ordered are listed, but only abnormal results are displayed) Labs Reviewed  CBC WITH DIFFERENTIAL/PLATELET - Abnormal; Notable for the following components:      Result Value   WBC 10.7 (*)    Hemoglobin 12.2 (*)    MCV 75.8 (*)    MCH 23.0 (*)    RDW 15.7 (*)    All other components within normal limits  COMPREHENSIVE METABOLIC PANEL WITH GFR - Abnormal; Notable for the following components:   Potassium 3.3 (*)    Glucose, Bld 112 (*)    All other components within normal limits  CBG MONITORING, ED - Abnormal; Notable for the following components:   Glucose-Capillary 106 (*)    All other components within normal limits  TROPONIN I (HIGH SENSITIVITY) - Abnormal; Notable for the following components:   Troponin I (High Sensitivity) 38 (*)    All other components within normal limits  TROPONIN I (HIGH SENSITIVITY) - Abnormal; Notable for the following components:   Troponin I (High Sensitivity) 25 (*)    All other components within normal limits  URINALYSIS, ROUTINE W REFLEX MICROSCOPIC    EKG None  Radiology CT HEAD WO CONTRAST ( ) Result Date: 03/25/2024 CLINICAL DATA:  Generalized weakness with peripheral neuropathy EXAM: CT HEAD WITHOUT CONTRAST CT CERVICAL SPINE WITHOUT CONTRAST TECHNIQUE: Multidetector CT imaging of the head and cervical spine was performed following the standard protocol without intravenous contrast. Multiplanar CT image reconstructions of the cervical spine were also generated. RADIATION DOSE REDUCTION: This exam was performed according to the departmental dose-optimization program which includes automated exposure control, adjustment of the mA and/or kV  according to patient size and/or use of iterative reconstruction technique. COMPARISON:  01/31/2024 FINDINGS: CT HEAD FINDINGS Brain: No evidence of acute infarction, hemorrhage, hydrocephalus, extra-axial collection or mass lesion/mass effect. Vascular: No hyperdense vessel or unexpected calcification. Skull: Normal. Negative for fracture or focal lesion. Sinuses/Orbits: No acute finding. Other: None. CT CERVICAL SPINE FINDINGS Alignment: Normal. Skull base and vertebrae: 7 cervical segments are well visualized. Vertebral body height is well maintained. Mild osteophytic changes are seen. Minimal facet hypertrophic changes are noted. No acute fracture or acute facet is noted. Soft tissues and spinal canal: Surrounding soft tissue structures are within normal limits. Upper chest: Lung apices are within normal limits. Other: None IMPRESSION: CT of the head: No acute intracranial abnormality noted. CT of cervical spine: Mild degenerative change without acute abnormality. Electronically Signed   By: Alcide Clever M.D.   On: 03/25/2024 03:16   CT Cervical Spine Wo Contrast Result Date: 03/25/2024 CLINICAL DATA:  Generalized weakness with peripheral neuropathy EXAM: CT HEAD WITHOUT CONTRAST CT CERVICAL SPINE WITHOUT CONTRAST TECHNIQUE: Multidetector CT imaging of the head and cervical spine was performed following the standard protocol without intravenous contrast. Multiplanar CT image reconstructions of the cervical spine were also generated. RADIATION DOSE REDUCTION: This exam was performed according to the departmental dose-optimization program which includes automated exposure control, adjustment of the mA and/or kV according to patient size and/or use of iterative reconstruction technique. COMPARISON:  01/31/2024 FINDINGS: CT HEAD FINDINGS Brain: No evidence of acute infarction, hemorrhage, hydrocephalus, extra-axial collection or mass lesion/mass effect. Vascular:  No hyperdense vessel or unexpected calcification.  Skull: Normal. Negative for fracture or focal lesion. Sinuses/Orbits: No acute finding. Other: None. CT CERVICAL SPINE FINDINGS Alignment: Normal. Skull base and vertebrae: 7 cervical segments are well visualized. Vertebral body height is well maintained. Mild osteophytic changes are seen. Minimal facet hypertrophic changes are noted. No acute fracture or acute facet is noted. Soft tissues and spinal canal: Surrounding soft tissue structures are within normal limits. Upper chest: Lung apices are within normal limits. Other: None IMPRESSION: CT of the head: No acute intracranial abnormality noted. CT of cervical spine: Mild degenerative change without acute abnormality. Electronically Signed   By: Alcide Clever M.D.   On: 03/25/2024 03:16   DG Ribs Unilateral W/Chest Left Result Date: 03/25/2024 CLINICAL DATA:  Left-sided rib pain following fall 2 months ago, initial encounter EXAM: LEFT RIBS AND CHEST - 3+ VIEW COMPARISON:  03/05/2024 FINDINGS: Cardiac shadow is mildly prominent but stable. The lungs are clear bilaterally. No effusion or pneumothorax is seen. No acute rib abnormality noted. IMPRESSION: No acute abnormality noted. Electronically Signed   By: Alcide Clever M.D.   On: 03/25/2024 02:48    Procedures Procedures    Medications Ordered in ED Medications  potassium chloride SA (KLOR-CON M) CR tablet 40 mEq (has no administration in time range)  fentaNYL (SUBLIMAZE) injection 12.5 mcg (12.5 mcg Intravenous Given 03/25/24 0310)    ED Course/ Medical Decision Making/ A&P                                 Medical Decision Making 73 year old male with left-sided rib pain as well as numbness in bilateral hands.  Exquisitely hypertensive on intake.  Acute systolic murmur In context of known HOCM.  Pulmonary exam unremarkable, abdominal exam is benign.  Palpation of the left chest wall with soreness over the ribs as above.  No midline tenderness patient of the C, T, or L-spine.  Nonfocal  neurologic exam with symmetric strength and sensation in upper and lower extremities bilaterally.  Amount and/or Complexity of Data Reviewed Labs: ordered.    Details: CBC with mild leukocytosis of 10.7, anemia with hemoglobin of 12.2 at patient's baseline.  CMP with mild hypokalemia of 3.3.  Troponin very mildly elevated to 38.  CBG unremarkable. Repeat troponin 25, at patient baseline.  Radiology: ordered.    Details:   CT head without acute cranial normality and CT C-spine negative for acute traumatic injury.  Plain films of the ribs and chest are unremarkable.  Risk Prescription drug management.   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  Would suspect likely rib contusions and possible occult fractures of the left ribs without evidence of acute respiratory compromise or pulmonary abnormality on x-ray.  Clinical concern for emergent underlying traumatic injury or condition that would warrant further ED workup and patient management is exceedingly low.Doreene Adas  voiced understanding of his medical evaluation and treatment plan. Each of their questions answered to their expressed satisfaction.  Return precautions were given.  Patient is well-appearing, stable, and was discharged in good condition.  This chart was dictated using voice recognition software, Dragon. Despite the best efforts of this provider to proofread and correct errors, errors may still occur which can change documentation meaning.         Final Clinical Impression(s) / ED Diagnoses Final diagnoses:  None  Rx / DC Orders ED Discharge Orders     None         Sherrilee Gilles 03/25/24 0612    Gilda Crease, MD 03/25/24 [phone]

## 2024-03-25 NOTE — Discharge Instructions (Addendum)
 You were seen in the ER today for your rib pain and hand numbness. You may have bruised your ribs on your recent fall. Please take the prescribed pain medication and follow up with your primary care doctor. Return to the ER with any new severe symptoms.

## 2024-03-25 NOTE — ED Triage Notes (Signed)
 Pt reports constant pins/needles pain throughout body for past 2 months. Has gotten progressively worse. Pt reports falling 4-5 times prior to pain starting. Pt reports generalized weakness associated with pain. No slurred speech/confusion/facial droop noted. 227/66 BP in triage.

## 2024-04-08 ENCOUNTER — Other Ambulatory Visit: Payer: Self-pay

## 2024-04-08 ENCOUNTER — Emergency Department (HOSPITAL_COMMUNITY)
Admission: EM | Admit: 2024-04-08 | Discharge: 2024-04-08 | Disposition: A | Discharge status: Home / Self Care | Attending: Emergency Medicine | Admitting: Emergency Medicine

## 2024-04-08 DIAGNOSIS — R209 Unspecified disturbances of skin sensation: Secondary | ICD-10-CM | POA: Diagnosis present

## 2024-04-08 DIAGNOSIS — Z7982 Long term (current) use of aspirin: Secondary | ICD-10-CM | POA: Diagnosis not present

## 2024-04-08 DIAGNOSIS — R140 Abdominal distension (gaseous): Secondary | ICD-10-CM | POA: Diagnosis not present

## 2024-04-08 DIAGNOSIS — E119 Type 2 diabetes mellitus without complications: Secondary | ICD-10-CM | POA: Insufficient documentation

## 2024-04-08 DIAGNOSIS — R202 Paresthesia of skin: Secondary | ICD-10-CM | POA: Insufficient documentation

## 2024-04-08 DIAGNOSIS — Z7984 Long term (current) use of oral hypoglycemic drugs: Secondary | ICD-10-CM | POA: Insufficient documentation

## 2024-04-08 DIAGNOSIS — I509 Heart failure, unspecified: Secondary | ICD-10-CM | POA: Diagnosis not present

## 2024-04-08 LAB — CBC WITH DIFFERENTIAL/PLATELET
Abs Immature Granulocytes: 0.03 10*3/uL (ref 0.00–0.07)
Basophils Absolute: 0.1 10*3/uL (ref 0.0–0.1)
Basophils Relative: 1 %
Eosinophils Absolute: 0.3 10*3/uL (ref 0.0–0.5)
Eosinophils Relative: 3 %
HCT: 45 % (ref 39.0–52.0)
Hemoglobin: 13.7 g/dL (ref 13.0–17.0)
Immature Granulocytes: 0 %
Lymphocytes Relative: 37 %
Lymphs Abs: 4.1 10*3/uL — ABNORMAL HIGH (ref 0.7–4.0)
MCH: 23.3 pg — ABNORMAL LOW (ref 26.0–34.0)
MCHC: 30.4 g/dL (ref 30.0–36.0)
MCV: 76.4 fL — ABNORMAL LOW (ref 80.0–100.0)
Monocytes Absolute: 0.7 10*3/uL (ref 0.1–1.0)
Monocytes Relative: 6 %
Neutro Abs: 5.8 10*3/uL (ref 1.7–7.7)
Neutrophils Relative %: 53 %
Platelets: 280 10*3/uL (ref 150–400)
RBC: 5.89 MIL/uL — ABNORMAL HIGH (ref 4.22–5.81)
RDW: 16.6 % — ABNORMAL HIGH (ref 11.5–15.5)
WBC: 11 10*3/uL — ABNORMAL HIGH (ref 4.0–10.5)
nRBC: 0 % (ref 0.0–0.2)

## 2024-04-08 LAB — COMPREHENSIVE METABOLIC PANEL WITH GFR
ALT: 17 U/L (ref 0–44)
AST: 22 U/L (ref 15–41)
Albumin: 3.9 g/dL (ref 3.5–5.0)
Alkaline Phosphatase: 50 U/L (ref 38–126)
Anion gap: 6 (ref 5–15)
BUN: 15 mg/dL (ref 8–23)
CO2: 26 mmol/L (ref 22–32)
Calcium: 9.5 mg/dL (ref 8.9–10.3)
Chloride: 109 mmol/L (ref 98–111)
Creatinine, Ser: 0.97 mg/dL (ref 0.61–1.24)
GFR, Estimated: >60 mL/min (ref >60)
Glucose, Bld: 90 mg/dL (ref 70–99)
Potassium: 4 mmol/L (ref 3.5–5.1)
Sodium: 141 mmol/L (ref 135–145)
Total Bilirubin: 0.5 mg/dL (ref 0.0–1.2)
Total Protein: 7.5 g/dL (ref 6.5–8.1)

## 2024-04-08 LAB — MAGNESIUM: Magnesium: 1.7 mg/dL (ref 1.7–2.4)

## 2024-04-08 MED ORDER — KETOROLAC TROMETHAMINE 15 MG/ML IJ SOLN
15.0000 mg | Freq: Once | INTRAMUSCULAR | Status: DC
  Filled 2024-04-08: qty 1

## 2024-04-08 MED ORDER — KETOROLAC TROMETHAMINE 15 MG/ML IJ SOLN
15.0000 mg | Freq: Once | INTRAMUSCULAR | Status: AC
  Administered 2024-04-08: 15 mg via INTRAVENOUS

## 2024-04-08 NOTE — ED Provider Notes (Signed)
 [place] EMERGENCY DEPARTMENT AT [facility] Provider Note   CSN: [id number] Arrival date & time: 04/08/24  0043     History  Chief Complaint  Patient presents with   Generalized Numbness    [name] is a 73 y.o. male.  Patient presents to the emergency department complaining of paresthesias in bilateral hands and feet.  This has been gradual in onset over the past few months.  He states that for the past 2 months he has noticed it more than usual.  He reports occasional falls.  He does have steady gait at time of arrival.  He states he has been unable to see his primary care for evaluation of the same.  He has been seen twice in the emergency department with presentation suggesting likely diabetic neuropathy.  He denies abdominal pain, nausea, vomiting, shortness of breath, urinary symptoms.  He denies any weakness.  Past medical history significant pituitary adenoma, chronic pain, type II DM, CHF, history of TIA, HOCM, narcotic abuse  HPI     Home Medications Prior to Admission medications   Medication Sig Start Date End Date Taking? Authorizing Provider  albuterol (PROVENTIL HFA;VENTOLIN HFA) 108 (90 Base) MCG/ACT inhaler Inhale 1-2 puffs into the lungs every 6 (six) hours as needed for wheezing. 09/04/16   Rolland Porter, MD  aspirin EC 81 MG tablet Take 81 mg by mouth every morning.    [provider]  atorvastatin (LIPITOR) 40 MG tablet Take 1 tablet (40 mg total) by mouth daily. 11/12/23   Danford, Earl Lites, MD  carvedilol (COREG) 25 MG tablet Take 1 tablet (25 mg total) by mouth 2 (two) times daily with a meal. 12/04/23   Tyrone Nine, MD  diclofenac Sodium (VOLTAREN) 1 % GEL Apply 2 g topically 4 (four) times daily. 11/12/23   Danford, Earl Lites, MD  gabapentin (NEURONTIN) 600 MG tablet Take 600 mg by mouth 3 (three) times daily. 12/08/23   [provider]  hydrALAZINE (APRESOLINE) 100 MG tablet Take 1 tablet (100 mg total) by mouth  every 8 (eight) hours. 12/04/23   Tyrone Nine, MD  HYDROcodone-acetaminophen (NORCO/VICODIN) 5-325 MG tablet Take 2 tablets by mouth every 4 (four) hours as needed. 03/25/24   Sponseller, Lupe Carney R, PA-C  ibuprofen (ADVIL) 800 MG tablet Take 800 mg by mouth 3 (three) times daily as needed. 12/17/23   [provider]  lidocaine (LIDODERM) 5 % Place 1 patch onto the skin daily. Remove & Discard patch within 12 hours or as directed by MD 01/31/24   Linwood Dibbles, MD  metFORMIN (GLUCOPHAGE) 500 MG tablet Take 1 tablet (500 mg total) by mouth 2 (two) times daily. 11/12/23   Danford, Earl Lites, MD  nicotine (NICODERM CQ - DOSED IN MG/24 HOURS) 21 mg/24hr patch Place 1 patch (21 mg total) onto the skin daily. Patient taking differently: Place 21 mg onto the skin daily as needed (smoking cessation). 02/21/20   Rodolph Bong, MD  omeprazole (PRILOSEC) 20 MG capsule Take 1 capsule (20 mg total) by mouth daily. 12/14/22   Arby Barrette, MD  ondansetron (ZOFRAN-ODT) 8 MG disintegrating tablet Take 1 tablet (8 mg total) by mouth every 8 (eight) hours as needed for nausea or vomiting. 08/19/22   Margarita Grizzle, MD  tamsulosin (FLOMAX) 0.4 MG CAPS capsule Take 1 capsule (0.4 mg total) by mouth daily after supper. 11/12/23   Danford, Earl Lites, MD  verapamil (CALAN-SR) 180 MG CR tablet Take 2 tablets (360 mg  total) by mouth daily. 11/12/23   Danford, Earl Lites, MD      Allergies    Patient has no known allergies.    Review of Systems   Review of Systems  Physical Exam Updated Vital Signs BP (!) 169/116 (BP Location: Left Arm)   Pulse 62   Temp 98.5 F (36.9 C) (Oral)   Resp 19   SpO2 100%  Physical Exam Vitals and nursing note reviewed.  Constitutional:      Appearance: He is normal weight. He is not ill-appearing or toxic-appearing.  HENT:     Head: Normocephalic and atraumatic.     Nose: Nose normal.     Mouth/Throat:     Mouth: Mucous membranes are moist.     Dentition:  Abnormal dentition.  Eyes:     General: Lids are normal. Vision grossly intact.        Right eye: No discharge.        Left eye: No discharge.     Extraocular Movements: Extraocular movements intact.     Conjunctiva/sclera: Conjunctivae normal.     Pupils: Pupils are equal, round, and reactive to light.  Neck:     Trachea: Trachea and phonation normal.  Cardiovascular:     Rate and Rhythm: Normal rate and regular rhythm.     Pulses: Normal pulses.  Pulmonary:     Effort: Pulmonary effort is normal. No respiratory distress.     Breath sounds: Normal breath sounds.  Abdominal:     Palpations: Abdomen is soft.     Tenderness: There is no abdominal tenderness.  Musculoskeletal:        General: No deformity.     Cervical back: Normal, normal range of motion and neck supple. No bony tenderness. No muscular tenderness.     Thoracic back: Normal. No bony tenderness.     Lumbar back: Normal. No bony tenderness.     Right lower leg: No edema.     Left lower leg: No edema.  Lymphadenopathy:     Cervical: No cervical adenopathy.  Skin:    General: Skin is warm and dry.     Capillary Refill: Capillary refill takes less than 2 seconds.     Findings: No bruising or ecchymosis.  Neurological:     General: No focal deficit present.     Mental Status: He is alert and oriented to person, place, and time. Mental status is at baseline.     GCS: GCS eye subscore is 4. GCS verbal subscore is 5. GCS motor subscore is 6.     Cranial Nerves: Cranial nerves 2-12 are intact.     Sensory: Sensation is intact.     Motor: Motor function is intact.     Coordination: Coordination is intact.     Gait: Gait normal.     Comments: Patient with steady gait  Psychiatric:        Mood and Affect: Mood normal.     ED Results / Procedures / Treatments   Labs (all labs ordered are listed, but only abnormal results are displayed) Labs Reviewed  CBC WITH DIFFERENTIAL/PLATELET - Abnormal; Notable for the  following components:      Result Value   WBC 11.0 (*)    RBC 5.89 (*)    MCV 76.4 (*)    MCH 23.3 (*)    RDW 16.6 (*)    Lymphs Abs 4.1 (*)    All other components within normal limits  COMPREHENSIVE METABOLIC PANEL WITH GFR  MAGNESIUM    EKG None  Radiology No results found.  Procedures Procedures    Medications Ordered in ED Medications - No data to display  ED Course/ Medical Decision Making/ A&P                                 Medical Decision Making Amount and/or Complexity of Data Reviewed Labs: ordered.   This patient presents to the ED for concern of hand and foot paresthesias, this involves an extensive number of treatment options, and is a complaint that carries with it a high risk of complications and morbidity.  The differential diagnosis includes neuropathy, stroke, nerve injury, others   Co morbidities that complicate the patient evaluation  Type II DM, history of CVA   Additional history obtained:  Additional history obtained from EMS External records from outside source obtained and reviewed including previous imaging   Lab Tests:  I Ordered, and personally interpreted labs.  The pertinent results include: Grossly unremarkable CMP, CBC, magnesium   Social Determinants of Health:  Patient is a daily tobacco smoker   Test / Admission - Considered:  Patient with a nonfocal neurologic exam with symmetric strength and sensation in bilateral upper and lower extremities.  Lab work grossly unremarkable.  Patient previously had CT head and cervical spine at the end of March for the same complaint with no acute findings.  Clinically the patient's symptoms seem most consistent with diabetic neuropathy.  No indication for further emergent workup or admission.  Patient to follow-up with primary care for further recommendations.  Return precautions provided.         Final Clinical Impression(s) / ED Diagnoses Final diagnoses:  Paresthesias     Rx / DC Orders ED Discharge Orders     None         Pamala Duffel 04/08/24 0415    Nira Conn, MD 04/08/24 [phone]

## 2024-04-08 NOTE — ED Notes (Signed)
 Pt stating "I can't swallow". Pt given emesis bag for increased saliva.

## 2024-04-08 NOTE — ED Triage Notes (Signed)
 Pt BIBA with c/o generalized numbness all over starting from the neck down x2 months. Reports having increased pain recently. Reports falling 6-7 times in the recent past. Steady gait on scene noted by EMS. A&Ox4. BP 206/78- HR 72

## 2024-04-08 NOTE — Discharge Instructions (Signed)
 Your workup tonight was reassuring.  Your symptoms may be due to to a condition called diabetic neuropathy.  Please follow-up with your primary care team for further evaluation as needed.  If you develop any life-threatening symptoms please return to the emergency department.

## 2024-04-21 ENCOUNTER — Encounter (HOSPITAL_COMMUNITY): Payer: Self-pay

## 2024-04-21 ENCOUNTER — Other Ambulatory Visit: Payer: Self-pay

## 2024-04-21 ENCOUNTER — Emergency Department (HOSPITAL_COMMUNITY)
Admission: EM | Admit: 2024-04-21 | Discharge: 2024-04-22 | Disposition: A | Discharge status: Home / Self Care | Attending: Emergency Medicine | Admitting: Emergency Medicine

## 2024-04-21 ENCOUNTER — Emergency Department (HOSPITAL_COMMUNITY)

## 2024-04-21 DIAGNOSIS — Z7982 Long term (current) use of aspirin: Secondary | ICD-10-CM | POA: Insufficient documentation

## 2024-04-21 DIAGNOSIS — I10 Essential (primary) hypertension: Secondary | ICD-10-CM | POA: Diagnosis not present

## 2024-04-21 DIAGNOSIS — Z79899 Other long term (current) drug therapy: Secondary | ICD-10-CM | POA: Diagnosis not present

## 2024-04-21 DIAGNOSIS — E1140 Type 2 diabetes mellitus with diabetic neuropathy, unspecified: Secondary | ICD-10-CM | POA: Insufficient documentation

## 2024-04-21 DIAGNOSIS — R531 Weakness: Secondary | ICD-10-CM | POA: Diagnosis present

## 2024-04-21 DIAGNOSIS — R202 Paresthesia of skin: Secondary | ICD-10-CM

## 2024-04-21 DIAGNOSIS — Z7984 Long term (current) use of oral hypoglycemic drugs: Secondary | ICD-10-CM | POA: Diagnosis not present

## 2024-04-21 LAB — COMPREHENSIVE METABOLIC PANEL WITH GFR
ALT: 23 U/L (ref 0–44)
AST: 28 U/L (ref 15–41)
Albumin: 3.8 g/dL (ref 3.5–5.0)
Alkaline Phosphatase: 43 U/L (ref 38–126)
Anion gap: 8 (ref 5–15)
BUN: 29 mg/dL — ABNORMAL HIGH (ref 8–23)
CO2: 24 mmol/L (ref 22–32)
Calcium: 9.3 mg/dL (ref 8.9–10.3)
Chloride: 110 mmol/L (ref 98–111)
Creatinine, Ser: 1.42 mg/dL — ABNORMAL HIGH (ref 0.61–1.24)
GFR, Estimated: 53 mL/min — ABNORMAL LOW (ref >60)
Glucose, Bld: 126 mg/dL — ABNORMAL HIGH (ref 70–99)
Potassium: 3.6 mmol/L (ref 3.5–5.1)
Sodium: 142 mmol/L (ref 135–145)
Total Bilirubin: 0.7 mg/dL (ref 0.0–1.2)
Total Protein: 7.1 g/dL (ref 6.5–8.1)

## 2024-04-21 LAB — CBC WITH DIFFERENTIAL/PLATELET
Abs Immature Granulocytes: 0.03 10*3/uL (ref 0.00–0.07)
Basophils Absolute: 0 10*3/uL (ref 0.0–0.1)
Basophils Relative: 0 %
Eosinophils Absolute: 0.2 10*3/uL (ref 0.0–0.5)
Eosinophils Relative: 2 %
HCT: 42.5 % (ref 39.0–52.0)
Hemoglobin: 13.1 g/dL (ref 13.0–17.0)
Immature Granulocytes: 0 %
Lymphocytes Relative: 28 %
Lymphs Abs: 2.9 10*3/uL (ref 0.7–4.0)
MCH: 23.4 pg — ABNORMAL LOW (ref 26.0–34.0)
MCHC: 30.8 g/dL (ref 30.0–36.0)
MCV: 76 fL — ABNORMAL LOW (ref 80.0–100.0)
Monocytes Absolute: 0.6 10*3/uL (ref 0.1–1.0)
Monocytes Relative: 6 %
Neutro Abs: 6.5 10*3/uL (ref 1.7–7.7)
Neutrophils Relative %: 64 %
Platelets: 229 10*3/uL (ref 150–400)
RBC: 5.59 MIL/uL (ref 4.22–5.81)
RDW: 15.3 % (ref 11.5–15.5)
WBC: 10.2 10*3/uL (ref 4.0–10.5)
nRBC: 0 % (ref 0.0–0.2)

## 2024-04-21 LAB — CBG MONITORING, ED: Glucose-Capillary: 112 mg/dL — ABNORMAL HIGH (ref 70–99)

## 2024-04-21 LAB — CK: Total CK: 184 U/L (ref 49–397)

## 2024-04-21 LAB — TROPONIN I (HIGH SENSITIVITY): Troponin I (High Sensitivity): 38 ng/L — ABNORMAL HIGH (ref <18)

## 2024-04-21 MED ORDER — SODIUM CHLORIDE (PF) 0.9 % IJ SOLN
INTRAMUSCULAR | Status: AC
  Filled 2024-04-21: qty 50

## 2024-04-21 MED ORDER — CARVEDILOL 12.5 MG PO TABS
25.0000 mg | ORAL_TABLET | Freq: Once | ORAL | Status: AC
  Administered 2024-04-21: 25 mg via ORAL
  Filled 2024-04-21: qty 2

## 2024-04-21 MED ORDER — HYDRALAZINE HCL 25 MG PO TABS
100.0000 mg | ORAL_TABLET | Freq: Once | ORAL | Status: AC
  Administered 2024-04-21: 100 mg via ORAL
  Filled 2024-04-21: qty 4

## 2024-04-21 MED ORDER — IOHEXOL 350 MG/ML SOLN: 75.0000 mL | Freq: Once | INTRAVENOUS | Status: DC | PRN

## 2024-04-21 NOTE — Progress Notes (Signed)
Unit RN able to establish PIV access

## 2024-04-21 NOTE — ED Provider Notes (Signed)
 [place] [facility]GENC[facility] DEPARTMENT AT [facility] Provider Note   CSN: [id number] Arrival date & time: 04/21/24  1308     History  Chief Complaint  Patient presents with   Extremity Weakness    Bilateral legs    [name] is a 73 y.o. male history of hypertension, diabetic neuropathy, who presented with leg weakness.  Patient states that he has sudden onset of trouble walking and weakness bilaterally.  Patient states that EMS was called and his blood pressure was over 200.  Patient states that this has happened when his blood pressure was elevated.  Patient is compliant with his BP meds but did not take his nighttime dose.  Patient has seen here previously for similar symptoms.  Patient recently had a UTI and finished a course of antibiotics.  Denies any urinary symptoms right now.  The history is provided by the patient.       Home Medications Prior to Admission medications   Medication Sig Start Date End Date Taking? Authorizing Provider  albuterol  (PROVENTIL  HFA;VENTOLIN  HFA) 108 (90 Base) MCG/ACT inhaler Inhale 1-2 puffs into the lungs every 6 (six) hours as needed for wheezing. 09/04/16   Eino Gravel, MD  aspirin  EC 81 MG tablet Take 81 mg by mouth every morning.    [provider]  atorvastatin  (LIPITOR) 40 MG tablet Take 1 tablet (40 mg total) by mouth daily. 11/12/23   Danford, Willis Harter, MD  carvedilol  (COREG ) 25 MG tablet Take 1 tablet (25 mg total) by mouth 2 (two) times daily with a meal. 12/04/23   Wynetta Heckle, MD  diclofenac  Sodium (VOLTAREN ) 1 % GEL Apply 2 g topically 4 (four) times daily. 11/12/23   Danford, Willis Harter, MD  gabapentin  (NEURONTIN ) 600 MG tablet Take 600 mg by mouth 3 (three) times daily. 12/08/23   [provider]  hydrALAZINE  (APRESOLINE ) 100 MG tablet Take 1 tablet (100 mg total) by mouth every 8 (eight) hours. 12/04/23   Wynetta Heckle, MD  HYDROcodone -acetaminophen  (NORCO/VICODIN) 5-325 MG tablet Take 2 tablets  by mouth every 4 (four) hours as needed. 03/25/24   Sponseller, Adelle Agent, PA-C  ibuprofen  (ADVIL ) 800 MG tablet Take 800 mg by mouth 3 (three) times daily as needed. 12/17/23   [provider]  lidocaine  (LIDODERM ) 5 % Place 1 patch onto the skin daily. Remove & Discard patch within 12 hours or as directed by MD 01/31/24   Trish Furl, MD  metFORMIN  (GLUCOPHAGE ) 500 MG tablet Take 1 tablet (500 mg total) by mouth 2 (two) times daily. 11/12/23   Danford, Willis Harter, MD  nicotine  (NICODERM CQ  - DOSED IN MG/24 HOURS) 21 mg/24hr patch Place 1 patch (21 mg total) onto the skin daily. Patient taking differently: Place 21 mg onto the skin daily as needed (smoking cessation). 02/21/20   Armenta Landau, MD  omeprazole  (PRILOSEC) 20 MG capsule Take 1 capsule (20 mg total) by mouth daily. 12/14/22   Wynetta Heckle, MD  ondansetron  (ZOFRAN -ODT) 8 MG disintegrating tablet Take 1 tablet (8 mg total) by mouth every 8 (eight) hours as needed for nausea or vomiting. 08/19/22   Auston Blush, MD  tamsulosin  (FLOMAX ) 0.4 MG CAPS capsule Take 1 capsule (0.4 mg total) by mouth daily after supper. 11/12/23   Danford, Willis Harter, MD  verapamil  (CALAN -SR) 180 MG CR tablet Take 2 tablets (360 mg total) by mouth daily. 11/12/23   Danford, Willis Harter, MD      Allergies  Patient has no known allergies.    Review of Systems   Review of Systems  Neurological:  Positive for weakness.  All other systems reviewed and are negative.   Physical Exam Updated Vital Signs BP (!) 135/49 (BP Location: Left Arm)   Pulse (!) 55   Temp 98.1 F (36.7 C) (Oral)   Resp 18   SpO2 100%  Physical Exam Vitals and nursing note reviewed.  Constitutional:      Appearance: Normal appearance.  HENT:     Head: Normocephalic.     Right Ear: Tympanic membrane normal.     Left Ear: Tympanic membrane normal.     Nose: Nose normal.     Mouth/Throat:     Mouth: Mucous membranes are moist.  Eyes:     Extraocular  Movements: Extraocular movements intact.     Pupils: Pupils are equal, round, and reactive to light.  Cardiovascular:     Rate and Rhythm: Normal rate and regular rhythm.     Pulses: Normal pulses.  Pulmonary:     Effort: Pulmonary effort is normal.     Breath sounds: Normal breath sounds.  Abdominal:     General: Abdomen is flat.     Palpations: Abdomen is soft.  Musculoskeletal:        General: Normal range of motion.     Cervical back: Normal range of motion and neck supple.  Skin:    General: Skin is warm.     Capillary Refill: Capillary refill takes less than 2 seconds.  Neurological:     General: No focal deficit present.     Mental Status: He is alert and oriented to person, place, and time. Mental status is at baseline.     Comments: Cranial nerves II through XII intact.  Patient has normal strength and sensation bilateral arms and legs.  Patient has good pulses in all extremities.  Patient has normal gait  Psychiatric:        Mood and Affect: Mood normal.        Behavior: Behavior normal.     ED Results / Procedures / Treatments   Labs (all labs ordered are listed, but only abnormal results are displayed) Labs Reviewed  CBG MONITORING, ED - Abnormal; Notable for the following components:      Result Value   Glucose-Capillary 112 (*)    All other components within normal limits  CBC WITH DIFFERENTIAL/PLATELET  COMPREHENSIVE METABOLIC PANEL WITH GFR  CK  TROPONIN I (HIGH SENSITIVITY)    EKG None  Radiology No results found.  Procedures Procedures    Medications Ordered in ED Medications  carvedilol  (COREG ) tablet 25 mg (25 mg Oral Given 04/21/24 2008)  hydrALAZINE  (APRESOLINE ) tablet 100 mg (100 mg Oral Given 04/21/24 2008)    ED Course/ Medical Decision Making/ A&P                                 Medical Decision Making [name] is a 73 y.o. male here presenting with leg weakness.  Patient has bilateral leg weakness and hypertension.  Concern  for possible hypertensive urgency versus stroke versus symptomatic hypertension.  Plan to get CBC and CMP and CTA head and neck.  Will give his home dose of BP med  9 pm Patient is difficult IV stick.  CTA head is pending.  Patient's blood pressure is down to 140s after his home dose of oral meds.  Given suspicion for possible stroke, plan to transfer to [facility] for MRI brain and CTA head and neck. Dr. Laurine Pore will be accepting doctor.   Problems Addressed: Hypertension, unspecified type: acute illness or injury Weakness: acute illness or injury  Amount and/or Complexity of Data Reviewed Labs: ordered. Decision-making details documented in ED Course. Radiology: ordered and independent interpretation performed. Decision-making details documented in ED Course. ECG/medicine tests: ordered.  Risk Prescription drug management.    Final Clinical Impression(s) / ED Diagnoses Final diagnoses:  None    Rx / DC Orders ED Discharge Orders     None         Dalene Duck, MD 04/21/24 2300

## 2024-04-21 NOTE — ED Notes (Addendum)
 Family member of patient came to this RN to state that patient was complaining of hand numbness. Patient told this RN that hand numbness could be due to being cold. This RN gave patient warm blanket, rechecked vitals, and checked strength and sensory. Pt has equal strength in hands and good sensory. Family requested for this RN to notify provider. This RN notified Dr. Delana Favors.

## 2024-04-21 NOTE — ED Triage Notes (Signed)
 Pt presents to ED via EMS for bilateral leg weakness that started this evening. States this happens sometimes when BP spikes. Denies pain.   BP 168/60

## 2024-04-22 ENCOUNTER — Emergency Department (HOSPITAL_COMMUNITY)

## 2024-04-22 DIAGNOSIS — R531 Weakness: Secondary | ICD-10-CM | POA: Diagnosis not present

## 2024-04-22 LAB — TROPONIN I (HIGH SENSITIVITY): Troponin I (High Sensitivity): 48 ng/L — ABNORMAL HIGH (ref <18)

## 2024-04-22 MED ORDER — DIAZEPAM 5 MG/ML IJ SOLN
5.0000 mg | Freq: Once | INTRAMUSCULAR | Status: AC
  Administered 2024-04-22: 5 mg via INTRAVENOUS
  Filled 2024-04-22: qty 2

## 2024-04-22 MED ORDER — IOHEXOL 350 MG/ML SOLN
75.0000 mL | Freq: Once | INTRAVENOUS | Status: AC | PRN
  Administered 2024-04-22: 75 mL via INTRAVENOUS

## 2024-04-22 NOTE — ED Provider Notes (Signed)
  Physical Exam  BP (!) 151/48 (BP Location: Right Arm)   Pulse (!) 57   Temp 97.8 F (36.6 C) (Oral)   Resp 18   SpO2 100%   Physical Exam  Procedures  Procedures  ED Course / MDM    Medical Decision Making Amount and/or Complexity of Data Reviewed Labs: ordered. Radiology: ordered. ECG/medicine tests: ordered.  Risk Prescription drug management.   Patient transferred from [facility] for MRI, CTA head/neck in evaluation of numbness in hands and feet. This has been going on for months and has been felt likely to neuropathy. The patient reports he keeps falling and is unsteady. He walks with a walker.   Imaging studies negative tonight. No acute stroke to account for paresthesias.   On chart review, he has been seen in ED multiple times for same complaint and was last admitted 1/3 to 1/4 after fall. Per PT note at that time, it was recommended that he go to a rehab [facility] for improvement in mobility. The patient declined and family was to provide significant in-home support.   He ambulates in the ED but is unsteady. I discussed rehab placement with the patient and he continues to decline. He states he still has family help him at home. Discussed TOC social work assessment of in-home safety of mobility and assistive devices, which the patient accepts. TOC order placed. Patient to be discharged home.        Mandy Second, PA-C 04/22/24 0520    Kelsey Patricia, MD 04/22/24 [phone]

## 2024-04-22 NOTE — ED Triage Notes (Signed)
 Pt continues to have generalized pain.  He is alert and oriented at this time waiting for MRI.

## 2024-04-22 NOTE — Discharge Instructions (Signed)
 Use your walker when up and active for safety. As we discussed, a Child psychotherapist will contact you to arrange in-home evaluation for safety in mobility. Follow up with your doctor as needed.

## 2024-04-22 NOTE — ED Notes (Signed)
 Called MRI, one pt infront of him.  He will need medication for the MRI.

## 2024-06-11 ENCOUNTER — Emergency Department (HOSPITAL_COMMUNITY)
Admission: EM | Admit: 2024-06-11 | Discharge: 2024-06-12 | Disposition: A | Discharge status: Home / Self Care | Attending: Emergency Medicine | Admitting: Emergency Medicine

## 2024-06-11 ENCOUNTER — Emergency Department (HOSPITAL_COMMUNITY)

## 2024-06-11 ENCOUNTER — Encounter (HOSPITAL_COMMUNITY): Payer: Self-pay

## 2024-06-11 ENCOUNTER — Other Ambulatory Visit: Payer: Self-pay

## 2024-06-11 DIAGNOSIS — Z7982 Long term (current) use of aspirin: Secondary | ICD-10-CM | POA: Insufficient documentation

## 2024-06-11 DIAGNOSIS — I10 Essential (primary) hypertension: Secondary | ICD-10-CM | POA: Diagnosis not present

## 2024-06-11 DIAGNOSIS — E1140 Type 2 diabetes mellitus with diabetic neuropathy, unspecified: Secondary | ICD-10-CM | POA: Diagnosis not present

## 2024-06-11 DIAGNOSIS — R202 Paresthesia of skin: Secondary | ICD-10-CM | POA: Diagnosis not present

## 2024-06-11 DIAGNOSIS — R531 Weakness: Secondary | ICD-10-CM

## 2024-06-11 DIAGNOSIS — R0789 Other chest pain: Secondary | ICD-10-CM | POA: Diagnosis present

## 2024-06-11 DIAGNOSIS — Z7984 Long term (current) use of oral hypoglycemic drugs: Secondary | ICD-10-CM | POA: Insufficient documentation

## 2024-06-11 DIAGNOSIS — R072 Precordial pain: Secondary | ICD-10-CM

## 2024-06-11 DIAGNOSIS — Z79899 Other long term (current) drug therapy: Secondary | ICD-10-CM | POA: Diagnosis not present

## 2024-06-11 DIAGNOSIS — N390 Urinary tract infection, site not specified: Secondary | ICD-10-CM

## 2024-06-11 LAB — URINALYSIS, W/ REFLEX TO CULTURE (INFECTION SUSPECTED)
Bacteria, UA: NONE SEEN
Bilirubin Urine: NEGATIVE
Glucose, UA: NEGATIVE mg/dL
Hgb urine dipstick: NEGATIVE
Ketones, ur: NEGATIVE mg/dL
Nitrite: NEGATIVE
Protein, ur: 100 mg/dL — AB
Specific Gravity, Urine: 1.032 — ABNORMAL HIGH (ref 1.005–1.030)
WBC, UA: >50 WBC/hpf (ref 0–5)
pH: 6 (ref 5.0–8.0)

## 2024-06-11 LAB — COMPREHENSIVE METABOLIC PANEL WITH GFR
ALT: 22 U/L (ref 0–44)
AST: 26 U/L (ref 15–41)
Albumin: 3.3 g/dL — ABNORMAL LOW (ref 3.5–5.0)
Alkaline Phosphatase: 50 U/L (ref 38–126)
Anion gap: 10 (ref 5–15)
BUN: 24 mg/dL — ABNORMAL HIGH (ref 8–23)
CO2: 24 mmol/L (ref 22–32)
Calcium: 9.4 mg/dL (ref 8.9–10.3)
Chloride: 106 mmol/L (ref 98–111)
Creatinine, Ser: 1.28 mg/dL — ABNORMAL HIGH (ref 0.61–1.24)
GFR, Estimated: 60 mL/min — ABNORMAL LOW (ref >60)
Glucose, Bld: 87 mg/dL (ref 70–99)
Potassium: 4.3 mmol/L (ref 3.5–5.1)
Sodium: 140 mmol/L (ref 135–145)
Total Bilirubin: 0.7 mg/dL (ref 0.0–1.2)
Total Protein: 6.9 g/dL (ref 6.5–8.1)

## 2024-06-11 LAB — TROPONIN I (HIGH SENSITIVITY)
Troponin I (High Sensitivity): 15 ng/L (ref <18)
Troponin I (High Sensitivity): 16 ng/L (ref <18)

## 2024-06-11 LAB — CBC
HCT: 47.3 % (ref 39.0–52.0)
Hemoglobin: 14.6 g/dL (ref 13.0–17.0)
MCH: 23.3 pg — ABNORMAL LOW (ref 26.0–34.0)
MCHC: 30.9 g/dL (ref 30.0–36.0)
MCV: 75.6 fL — ABNORMAL LOW (ref 80.0–100.0)
Platelets: 388 10*3/uL (ref 150–400)
RBC: 6.26 MIL/uL — ABNORMAL HIGH (ref 4.22–5.81)
RDW: 17.1 % — ABNORMAL HIGH (ref 11.5–15.5)
WBC: 12.6 10*3/uL — ABNORMAL HIGH (ref 4.0–10.5)
nRBC: 0 % (ref 0.0–0.2)

## 2024-06-11 MED ORDER — LORAZEPAM 2 MG/ML IJ SOLN
1.0000 mg | Freq: Once | INTRAMUSCULAR | Status: AC
  Administered 2024-06-12: 1 mg via INTRAVENOUS
  Filled 2024-06-11: qty 1

## 2024-06-11 MED ORDER — IOHEXOL 350 MG/ML SOLN
100.0000 mL | Freq: Once | INTRAVENOUS | Status: AC | PRN
  Administered 2024-06-11: 100 mL via INTRAVENOUS

## 2024-06-11 NOTE — ED Triage Notes (Signed)
 Pt presents to ED via EMS from home with chest pain x30 minutes. Left sided 10/10. Radiates to left arm. Hx of HTN. Missed one dose yesterday per pt. 324 aspirin  given by EMS.

## 2024-06-11 NOTE — ED Provider Notes (Signed)
 [place] EMERGENCY DEPARTMENT AT [facility] Provider Note   CSN: [id number] Arrival date & time: 06/11/24  1734     Patient presents with: Chest Pain   [name] is a 73 y.o. male.   Pt is a 73 y.o. male history of hypertension, DM, peripheral diabetic neuropathy, with multiple ER visits for bilateral paresthesias, balance issues and trouble walking who is presenting today with complaint of sudden onset of left-sided numbness in his arm and leg that started at approximately 530 today while he was sitting on the couch.  He reports that after it started it made him scared and he started getting chest pain which is in the left side of his chest.  He reports never having symptoms like this before despite all of the paresthesias he has had from his diabetes in the past he reports this is different.  He denies any speech issues but is complaining of some neck pain.  No current chest pain at this time.  No abdominal pain.  He did feel little short of breath during this event.  He denies any shortness of breath now.  No recent cough, congestion.  Reports he was feeling his baseline prior to this.  He did take his blood pressure medication today.  He denies any alcohol or drug use.  The history is provided by the patient.  Chest Pain      Prior to Admission medications   Medication Sig Start Date End Date Taking? Authorizing Provider  albuterol  (PROVENTIL  HFA;VENTOLIN  HFA) 108 (90 Base) MCG/ACT inhaler Inhale 1-2 puffs into the lungs every 6 (six) hours as needed for wheezing. 09/04/16   Eino Gravel, MD  aspirin  EC 81 MG tablet Take 81 mg by mouth every morning.    [provider]  atorvastatin  (LIPITOR) 40 MG tablet Take 1 tablet (40 mg total) by mouth daily. 11/12/23   Danford, Willis Harter, MD  carvedilol  (COREG ) 25 MG tablet Take 1 tablet (25 mg total) by mouth 2 (two) times daily with a meal. 12/04/23   Wynetta Heckle, MD  diclofenac  Sodium (VOLTAREN ) 1 % GEL Apply 2  g topically 4 (four) times daily. 11/12/23   Danford, Willis Harter, MD  gabapentin  (NEURONTIN ) 600 MG tablet Take 600 mg by mouth 3 (three) times daily. 12/08/23   [provider]  hydrALAZINE  (APRESOLINE ) 100 MG tablet Take 1 tablet (100 mg total) by mouth every 8 (eight) hours. 12/04/23   Wynetta Heckle, MD  HYDROcodone -acetaminophen  (NORCO/VICODIN) 5-325 MG tablet Take 2 tablets by mouth every 4 (four) hours as needed. 03/25/24   Sponseller, Rebekah R, PA-C  ibuprofen  (ADVIL ) 800 MG tablet Take 800 mg by mouth 3 (three) times daily as needed. 12/17/23   [provider]  lidocaine  (LIDODERM ) 5 % Place 1 patch onto the skin daily. Remove & Discard patch within 12 hours or as directed by MD 01/31/24   Trish Furl, MD  metFORMIN  (GLUCOPHAGE ) 500 MG tablet Take 1 tablet (500 mg total) by mouth 2 (two) times daily. 11/12/23   Danford, Willis Harter, MD  nicotine  (NICODERM CQ  - DOSED IN MG/24 HOURS) 21 mg/24hr patch Place 1 patch (21 mg total) onto the skin daily. Patient taking differently: Place 21 mg onto the skin daily as needed (smoking cessation). 02/21/20   Armenta Landau, MD  omeprazole  (PRILOSEC) 20 MG capsule Take 1 capsule (20 mg total) by mouth daily. 12/14/22   Wynetta Heckle, MD  ondansetron  (ZOFRAN -ODT) 8 MG disintegrating tablet Take  1 tablet (8 mg total) by mouth every 8 (eight) hours as needed for nausea or vomiting. 08/19/22   Auston Blush, MD  tamsulosin  (FLOMAX ) 0.4 MG CAPS capsule Take 1 capsule (0.4 mg total) by mouth daily after supper. 11/12/23   Danford, Willis Harter, MD  verapamil  (CALAN -SR) 180 MG CR tablet Take 2 tablets (360 mg total) by mouth daily. 11/12/23   Danford, Willis Harter, MD    Allergies: Patient has no known allergies.    Review of Systems  Cardiovascular:  Positive for chest pain.    Updated Vital Signs BP (!) 117/50   Pulse (!) 52   Temp 98.3 F (36.8 C) (Oral)   Resp 10   Ht 5' 9 (1.753 m)   Wt 65.8 kg   SpO2 98%   BMI 21.41  kg/m   Physical Exam Vitals and nursing note reviewed.  Constitutional:      General: He is not in acute distress.    Appearance: He is well-developed.  HENT:     Head: Normocephalic and atraumatic.   Eyes:     General: No visual field deficit.    Conjunctiva/sclera: Conjunctivae normal.     Pupils: Pupils are equal, round, and reactive to light.    Cardiovascular:     Rate and Rhythm: Normal rate and regular rhythm.     Pulses: Normal pulses.     Heart sounds: No murmur heard. Pulmonary:     Effort: Pulmonary effort is normal. No respiratory distress.     Breath sounds: Normal breath sounds. No wheezing or rales.  Abdominal:     General: There is no distension.     Palpations: Abdomen is soft.     Tenderness: There is no abdominal tenderness. There is no guarding or rebound.   Musculoskeletal:        General: No tenderness. Normal range of motion.     Cervical back: Normal range of motion and neck supple.   Skin:    General: Skin is warm and dry.     Findings: No erythema or rash.   Neurological:     Mental Status: He is alert and oriented to person, place, and time.     Cranial Nerves: No cranial nerve deficit, dysarthria or facial asymmetry.     Sensory: Sensory deficit present.     Motor: No weakness.     Comments: Slight pronator drift of the left leg  Psychiatric:        Mood and Affect: Mood normal.        Behavior: Behavior normal.     (all labs ordered are listed, but only abnormal results are displayed) Labs Reviewed  CBC - Abnormal; Notable for the following components:      Result Value   WBC 12.6 (*)    RBC 6.26 (*)    MCV 75.6 (*)    MCH 23.3 (*)    RDW 17.1 (*)    All other components within normal limits  COMPREHENSIVE METABOLIC PANEL WITH GFR - Abnormal; Notable for the following components:   BUN 24 (*)    Creatinine, Ser 1.28 (*)    Albumin 3.3 (*)    GFR, Estimated 60 (*)    All other components within normal limits  RAPID URINE  DRUG SCREEN, HOSP PERFORMED  TROPONIN I (HIGH SENSITIVITY)  TROPONIN I (HIGH SENSITIVITY)    EKG: EKG Interpretation Date/Time:  Saturday June 11 2024 17:46:23 EDT Ventricular Rate:  56 PR Interval:  182 QRS Duration:  91 QT Interval:  435 QTC Calculation: 420 R Axis:   11  Text Interpretation: Sinus rhythm Probable left atrial enlargement Nonspecific T abnrm, anterolateral leads No significant change since last tracing Confirmed by Almond Army ([id number]) on 06/11/2024 5:59:41 PM  Radiology: [facility] Query Chest 2 View Result Date: 06/11/2024 CLINICAL DATA:  Chest pain. EXAM: CHEST - 2 VIEW COMPARISON:  Chest and left rib radiographs 03/25/2024 FINDINGS: Cardiac silhouette is again at the upper limits of normal size. Mediastinal contours are within limits. The lungs are clear. No pulmonary edema, pleural effusion, or pneumothorax. Mild multilevel degenerative disc changes of the thoracic spine. IMPRESSION: No active cardiopulmonary disease. Electronically Signed   By: Bertina Broccoli M.D.   On: 06/11/2024 18:43     Procedures   Medications Ordered in the ED  iohexol  (OMNIPAQUE ) 350 MG/ML injection 100 mL (100 mLs Intravenous Contrast Given 06/11/24 2210)                                    Medical Decision Making Amount and/or Complexity of Data Reviewed Independent Historian: EMS External Data Reviewed: notes. Labs: ordered. Decision-making details documented in ED Course. Radiology: ordered and independent interpretation performed. Decision-making details documented in ED Course. ECG/medicine tests: ordered and independent interpretation performed. Decision-making details documented in ED Course.  Risk Prescription drug management.   Pt with multiple medical problems and comorbidities and presenting today with a complaint that caries a high risk for morbidity and mortality.  Here today with the above symptoms.  Concern for aortic dissection versus stroke with improving symptoms versus  TIA.  Lower suspicion for ACS.  Low suspicion for acute abdominal process or lung issue such as PE, pneumonia.  Patient does have a slight pronator drift in the left foot and subjective numbness in the left arm and left leg.  No symptoms noted on the right.  No facial symptoms.  Spoke with Dr. Lindzen who agreed this is not a TNKase patient at this time unless he has worsening symptoms.  He has received 324 mg of aspirin .  TIA workup initiated as well as a dissection study.  I independently interpreted patient's labs and EKG.  EKG that showed no acute findings.  Troponin x 2 is negative, CMP with improved renal function with creatinine of 1.28 and normal electrolytes, CBC with mild leukocytosis of 12 and normal hemoglobin and platelet count.  I have independently visualized and interpreted pt's images today.  CXR wnl.  Head CT without bleed. On repeat evaluation patient is still having numbness in a heavy sensation in his left arm and leg.  Will get MRI for further evaluation.  Also on his CT dissection study there is no evidence of dissection but they do note thick-walled bladder with irregular wall thickening with some layering hemorrhage that may need a cystoscopy in the future.  On repeat evaluation patient did produce a urine and appears cloudy.  Will send a UA as well.      Final diagnoses:  None    ED Discharge Orders     None          Almond Army, MD 06/11/24 [phone]

## 2024-06-12 ENCOUNTER — Emergency Department (HOSPITAL_COMMUNITY)

## 2024-06-12 DIAGNOSIS — R0789 Other chest pain: Secondary | ICD-10-CM | POA: Diagnosis not present

## 2024-06-12 LAB — RAPID URINE DRUG SCREEN, HOSP PERFORMED
Amphetamines: NOT DETECTED
Barbiturates: NOT DETECTED
Benzodiazepines: NOT DETECTED
Cocaine: NOT DETECTED
Opiates: NOT DETECTED
Tetrahydrocannabinol: NOT DETECTED

## 2024-06-12 MED ORDER — CEFDINIR 300 MG PO CAPS: 300.0000 mg | ORAL_CAPSULE | Freq: Two times a day (BID) | ORAL | 0 refills | Status: AC

## 2024-06-12 MED ORDER — SODIUM CHLORIDE 0.9 % IV SOLN
1.0000 g | Freq: Once | INTRAVENOUS | Status: AC
  Administered 2024-06-12: 1 g via INTRAVENOUS
  Filled 2024-06-12: qty 10

## 2024-06-12 NOTE — ED Notes (Signed)
 Patient to MRI.

## 2024-06-14 LAB — URINE CULTURE: Culture: >=100000 — AB

## 2024-06-15 ENCOUNTER — Telehealth (HOSPITAL_BASED_OUTPATIENT_CLINIC_OR_DEPARTMENT_OTHER): Payer: Self-pay | Admitting: *Deleted

## 2024-06-15 NOTE — Telephone Encounter (Signed)
 Post ED Visit - Positive Culture Follow-up: Successful Patient Follow-Up  Culture assessed and recommendations reviewed by:  [x]  Sheryle Donning, Pharm.D. []  Skeet Duke, Pharm.D., BCPS AQ-ID []  Leslee Rase, Pharm.D., BCPS []  Garland Junk, Pharm.D., BCPS []  [place], [address].D., BCPS, AAHIVP []  Alcide Aly, Pharm.D., BCPS, AAHIVP []  Jerri Morale, PharmD, BCPS []  Graham Laws, PharmD, BCPS []  Cleda Curly, PharmD, BCPS []  Tamar Fairly, PharmD  Positive urine culture  []  Patient discharged without antimicrobial prescription and treatment is now indicated [x]  Organism is resistant to prescribed ED discharge antimicrobial []  Patient with positive blood cultures  Changes discussed with ED provider: Sonnie Dusky, PA New antibiotic prescription Amox/clav 875 mg po BID x 7 days Called to [facility], [address], [place], [place]  Contacted patient, date 06/15/24, time 1013   Zeb Heys 06/15/2024, 10:38 AM

## 2024-08-07 ENCOUNTER — Emergency Department (HOSPITAL_COMMUNITY)
Admission: EM | Admit: 2024-08-07 | Discharge: 2024-08-08 | Disposition: A | Discharge status: Home / Self Care | Attending: Emergency Medicine | Admitting: Emergency Medicine

## 2024-08-07 ENCOUNTER — Other Ambulatory Visit: Payer: Self-pay

## 2024-08-07 ENCOUNTER — Emergency Department (HOSPITAL_COMMUNITY)

## 2024-08-07 ENCOUNTER — Encounter (HOSPITAL_COMMUNITY): Payer: Self-pay | Admitting: Emergency Medicine

## 2024-08-07 DIAGNOSIS — R4182 Altered mental status, unspecified: Secondary | ICD-10-CM | POA: Insufficient documentation

## 2024-08-07 DIAGNOSIS — E119 Type 2 diabetes mellitus without complications: Secondary | ICD-10-CM | POA: Diagnosis not present

## 2024-08-07 DIAGNOSIS — F1910 Other psychoactive substance abuse, uncomplicated: Secondary | ICD-10-CM | POA: Insufficient documentation

## 2024-08-07 LAB — CBG MONITORING, ED: Glucose-Capillary: 127 mg/dL — ABNORMAL HIGH (ref 70–99)

## 2024-08-07 NOTE — ED Triage Notes (Signed)
 73 y/o male comes in by EMS with a possible overdose. Per EMS, pts neighbor went to bring him a plate of food stated  the neighbor reports he was not acting himself. Pt has hx of drug abuse and possibly took heroin this evening. On arrival, pt is alert, oriented to self, dob, but restless and following instructions intermittently. Pt is a diabetic and CBG was 140 on scene. Pt is answering questions intermittently, but is unable to give a clear reason for his ER visit today.

## 2024-08-07 NOTE — ED Notes (Signed)
Bed alarm in place.

## 2024-08-07 NOTE — ED Provider Notes (Signed)
 [facility] Emergency Department Provider Note MRN:  [id number]  Arrival date & time: 08/08/24     Chief Complaint   Drug Overdose   History of Present Illness   [name] is a 73 y.o. year-old male presents to the ED with chief complaint of abnormal behavior. Per EMS, the patient's neighbor went to take him a plate of food tonight and called EMS because the patient wasn't acting himself.    Patient has hx of polysubstance abuse.    History provided by patient.   Review of Systems  Pertinent positive and negative review of systems noted in HPI.    Physical Exam   Vitals:   08/07/24 2244 08/07/24 2245  BP: (!) 165/53 (!) 165/53  Pulse: 84 84  Resp: (!) 24 15  Temp:  98.8 F (37.1 C)  SpO2: 96% 96%    CONSTITUTIONAL:  twitchy-appearing NEURO:  Alert, unable to respond to questions appropriately, twitchy movements EYES:  eyes equal and reactive ENT/NECK:  Supple, no stridor  CARDIO:  normal rate, regular rhythm, appears well-perfused  PULM:  No respiratory distress, CTAB GI/GU:  non-distended,  MSK/SPINE:  No gross deformities, no edema, moves all extremities  SKIN:  no rash, atraumatic   *Additional and/or pertinent findings included in MDM below  Diagnostic and Interventional Summary    EKG Interpretation Date/Time:    Ventricular Rate:    PR Interval:    QRS Duration:    QT Interval:    QTC Calculation:   R Axis:      Text Interpretation:         Labs Reviewed  COMPREHENSIVE METABOLIC PANEL WITH GFR - Abnormal; Notable for the following components:      Result Value   Glucose, Bld 116 (*)    BUN 24 (*)    All other components within normal limits  SALICYLATE LEVEL - Abnormal; Notable for the following components:   Salicylate Lvl <7.0 (*)    All other components within normal limits  ACETAMINOPHEN  LEVEL - Abnormal; Notable for the following components:   Acetaminophen  (Tylenol ), Serum <10 (*)    All other  components within normal limits  CBC WITH DIFFERENTIAL/PLATELET - Abnormal; Notable for the following components:   WBC 13.2 (*)    MCV 75.7 (*)    MCH 23.4 (*)    RDW 15.9 (*)    Neutro Abs 10.2 (*)    All other components within normal limits  CBG MONITORING, ED - Abnormal; Notable for the following components:   Glucose-Capillary 127 (*)    All other components within normal limits  ETHANOL  RAPID URINE DRUG SCREEN, HOSP PERFORMED    CT HEAD WO CONTRAST ( )  Final Result      Medications - No data to display   Procedures  /  Critical Care Procedures  ED Course and Medical Decision Making  I have reviewed the triage vital signs, the nursing notes, and pertinent available records from the EMR.  Social Determinants Affecting Complexity of Care: Patient has no clinically significant social determinants affecting this chief complaint..   ED Course:    Medical Decision Making Patient here with abnormal behavior.  Has history of polysubstance abuse.  Reportedly, a neighbor was concerned that the patient was not acting like himself.  Labs ordered in triage are all fairly reassuring.  Patient refuses to give UDS.  He is now requesting discharge.  I suspect that his abnormal behavior is drug-induced, especially due to the  fact that he will not provide a UDS.  He has requested discharge, and seems to have capacity to make his own medical decisions.  I do not believe him to be a danger to himself or to others.  Amount and/or Complexity of Data Reviewed Labs: ordered. Radiology: ordered.         Consultants: No consultations were needed in caring for this patient.   Treatment and Plan: Emergency department workup does not suggest an emergent condition requiring admission or immediate intervention beyond  what has been performed at this time. The patient is safe for discharge and has  been instructed to return immediately for worsening symptoms, change in  symptoms or any  other concerns    Final Clinical Impressions(s) / ED Diagnoses     ICD-10-CM   1. Polysubstance abuse (HCC)  F19.10       ED Discharge Orders     None         Discharge Instructions Discussed with and Provided to Patient:   Discharge Instructions   None      Vicky Charleston, PA-C 08/08/24 0202    Jerral Meth, MD 08/08/24 [phone]

## 2024-08-08 DIAGNOSIS — R4182 Altered mental status, unspecified: Secondary | ICD-10-CM | POA: Diagnosis not present

## 2024-08-08 LAB — COMPREHENSIVE METABOLIC PANEL WITH GFR
ALT: 34 U/L (ref 0–44)
AST: 41 U/L (ref 15–41)
Albumin: 3.8 g/dL (ref 3.5–5.0)
Alkaline Phosphatase: 67 U/L (ref 38–126)
Anion gap: 10 (ref 5–15)
BUN: 24 mg/dL — ABNORMAL HIGH (ref 8–23)
CO2: 25 mmol/L (ref 22–32)
Calcium: 9.3 mg/dL (ref 8.9–10.3)
Chloride: 108 mmol/L (ref 98–111)
Creatinine, Ser: 1.03 mg/dL (ref 0.61–1.24)
GFR, Estimated: >60 mL/min (ref >60)
Glucose, Bld: 116 mg/dL — ABNORMAL HIGH (ref 70–99)
Potassium: 4.2 mmol/L (ref 3.5–5.1)
Sodium: 143 mmol/L (ref 135–145)
Total Bilirubin: 0.5 mg/dL (ref 0.0–1.2)
Total Protein: 7.8 g/dL (ref 6.5–8.1)

## 2024-08-08 LAB — CBC WITH DIFFERENTIAL/PLATELET
Abs Immature Granulocytes: 0.04 K/uL (ref 0.00–0.07)
Basophils Absolute: 0 K/uL (ref 0.0–0.1)
Basophils Relative: 0 %
Eosinophils Absolute: 0.1 K/uL (ref 0.0–0.5)
Eosinophils Relative: 1 %
HCT: 43.1 % (ref 39.0–52.0)
Hemoglobin: 13.3 g/dL (ref 13.0–17.0)
Immature Granulocytes: 0 %
Lymphocytes Relative: 16 %
Lymphs Abs: 2.1 K/uL (ref 0.7–4.0)
MCH: 23.4 pg — ABNORMAL LOW (ref 26.0–34.0)
MCHC: 30.9 g/dL (ref 30.0–36.0)
MCV: 75.7 fL — ABNORMAL LOW (ref 80.0–100.0)
Monocytes Absolute: 0.7 K/uL (ref 0.1–1.0)
Monocytes Relative: 6 %
Neutro Abs: 10.2 K/uL — ABNORMAL HIGH (ref 1.7–7.7)
Neutrophils Relative %: 77 %
Platelets: 263 K/uL (ref 150–400)
RBC: 5.69 MIL/uL (ref 4.22–5.81)
RDW: 15.9 % — ABNORMAL HIGH (ref 11.5–15.5)
WBC: 13.2 K/uL — ABNORMAL HIGH (ref 4.0–10.5)
nRBC: 0 % (ref 0.0–0.2)

## 2024-08-08 LAB — ETHANOL: Alcohol, Ethyl (B): <15 mg/dL (ref <15)

## 2024-08-08 LAB — ACETAMINOPHEN LEVEL: Acetaminophen (Tylenol), Serum: <10 ug/mL — ABNORMAL LOW (ref 10–30)

## 2024-08-08 LAB — SALICYLATE LEVEL: Salicylate Lvl: <7 mg/dL — ABNORMAL LOW (ref 7.0–30.0)

## 2024-08-10 ENCOUNTER — Emergency Department (HOSPITAL_COMMUNITY)

## 2024-08-10 ENCOUNTER — Emergency Department (HOSPITAL_COMMUNITY): Admission: EM | Admit: 2024-08-10 | Discharge: 2024-08-10 | Disposition: A | Discharge status: Home / Self Care

## 2024-08-10 ENCOUNTER — Other Ambulatory Visit: Payer: Self-pay

## 2024-08-10 DIAGNOSIS — I110 Hypertensive heart disease with heart failure: Secondary | ICD-10-CM | POA: Insufficient documentation

## 2024-08-10 DIAGNOSIS — Z7982 Long term (current) use of aspirin: Secondary | ICD-10-CM | POA: Insufficient documentation

## 2024-08-10 DIAGNOSIS — R7401 Elevation of levels of liver transaminase levels: Secondary | ICD-10-CM | POA: Insufficient documentation

## 2024-08-10 DIAGNOSIS — D649 Anemia, unspecified: Secondary | ICD-10-CM | POA: Insufficient documentation

## 2024-08-10 DIAGNOSIS — R404 Transient alteration of awareness: Secondary | ICD-10-CM

## 2024-08-10 DIAGNOSIS — I509 Heart failure, unspecified: Secondary | ICD-10-CM | POA: Insufficient documentation

## 2024-08-10 DIAGNOSIS — Z79899 Other long term (current) drug therapy: Secondary | ICD-10-CM | POA: Diagnosis not present

## 2024-08-10 DIAGNOSIS — D72829 Elevated white blood cell count, unspecified: Secondary | ICD-10-CM | POA: Diagnosis not present

## 2024-08-10 DIAGNOSIS — Z8673 Personal history of transient ischemic attack (TIA), and cerebral infarction without residual deficits: Secondary | ICD-10-CM | POA: Diagnosis not present

## 2024-08-10 DIAGNOSIS — Z7984 Long term (current) use of oral hypoglycemic drugs: Secondary | ICD-10-CM | POA: Diagnosis not present

## 2024-08-10 LAB — COMPREHENSIVE METABOLIC PANEL WITH GFR
ALT: 30 U/L (ref 0–44)
AST: 50 U/L — ABNORMAL HIGH (ref 15–41)
Albumin: 3.7 g/dL (ref 3.5–5.0)
Alkaline Phosphatase: 54 U/L (ref 38–126)
Anion gap: 12 (ref 5–15)
BUN: 23 mg/dL (ref 8–23)
CO2: 22 mmol/L (ref 22–32)
Calcium: 9.3 mg/dL (ref 8.9–10.3)
Chloride: 106 mmol/L (ref 98–111)
Creatinine, Ser: 1.07 mg/dL (ref 0.61–1.24)
GFR, Estimated: >60 mL/min (ref >60)
Glucose, Bld: 128 mg/dL — ABNORMAL HIGH (ref 70–99)
Potassium: 4.2 mmol/L (ref 3.5–5.1)
Sodium: 140 mmol/L (ref 135–145)
Total Bilirubin: 0.8 mg/dL (ref 0.0–1.2)
Total Protein: 7.3 g/dL (ref 6.5–8.1)

## 2024-08-10 LAB — CBC
HCT: 42.3 % (ref 39.0–52.0)
Hemoglobin: 12.9 g/dL — ABNORMAL LOW (ref 13.0–17.0)
MCH: 23.2 pg — ABNORMAL LOW (ref 26.0–34.0)
MCHC: 30.5 g/dL (ref 30.0–36.0)
MCV: 76.1 fL — ABNORMAL LOW (ref 80.0–100.0)
Platelets: 279 K/uL (ref 150–400)
RBC: 5.56 MIL/uL (ref 4.22–5.81)
RDW: 15.4 % (ref 11.5–15.5)
WBC: 11.2 K/uL — ABNORMAL HIGH (ref 4.0–10.5)
nRBC: 0 % (ref 0.0–0.2)

## 2024-08-10 LAB — ETHANOL: Alcohol, Ethyl (B): <15 mg/dL (ref <15)

## 2024-08-10 LAB — ACETAMINOPHEN LEVEL: Acetaminophen (Tylenol), Serum: <10 ug/mL — ABNORMAL LOW (ref 10–30)

## 2024-08-10 LAB — SALICYLATE LEVEL: Salicylate Lvl: <7 mg/dL — ABNORMAL LOW (ref 7.0–30.0)

## 2024-08-10 MED ORDER — HYDRALAZINE HCL 100 MG PO TABS: 100.0000 mg | ORAL_TABLET | Freq: Three times a day (TID) | ORAL | 0 refills | Status: AC

## 2024-08-10 MED ORDER — HYDRALAZINE HCL 25 MG PO TABS
50.0000 mg | ORAL_TABLET | Freq: Once | ORAL | Status: AC
  Administered 2024-08-10 (×2): 50 mg via ORAL
  Filled 2024-08-10: qty 2

## 2024-08-10 MED ORDER — NALOXONE HCL 2 MG/2ML IJ SOSY
1.0000 mg | PREFILLED_SYRINGE | Freq: Once | INTRAMUSCULAR | Status: AC
  Administered 2024-08-10 (×2): 1 mg via INTRAVENOUS
  Filled 2024-08-10: qty 2

## 2024-08-10 NOTE — ED Notes (Signed)
 Ortho tech called for crutches

## 2024-08-10 NOTE — Progress Notes (Signed)
 Orthopedic Tech Progress Note Patient Details:  [name] [birth year] [id number]  Ortho Devices Type of Ortho Device: Crutches Ortho Device/Splint Interventions: Ordered, Application, Adjustmentpatient needed a pair of pants before getting up to use crutches.    Post Interventions Patient Tolerated: Fair Instructions Provided: Care of device  Delanna LITTIE Pac 08/10/2024, 6:54 PM

## 2024-08-10 NOTE — ED Provider Notes (Signed)
 Requiring some crutches for assistance walking, but stable for discharge, he is requesting cab voucher, I think this is reasonable.  Cab voucher Provided.   Rosan Sherlean VEAR DEVONNA 08/10/24 2137    Gennaro Duwaine CROME, DO 08/10/24 2157

## 2024-08-10 NOTE — ED Triage Notes (Addendum)
 Patient arrives via [facility] for AMS with hypertension. Patient with pinpoint pupils with EMS. Found by roommate making noise and unresponsive. Responsive to pain with ems initially. Alert to self. Fidgety, EMS noted raw nostrils. Per roommate, patient was snorting unknown substance but will not disclose. Patient endorses no use of drugs. Hx of peaked T-waves. LKW approximately 1430 today.   EMS vitals HR 88 97 O2 on room air 220/90 BP 147 CBG   No access

## 2024-08-10 NOTE — ED Provider Notes (Signed)
 [place] EM[facility]ENC[facility] DEPARTMENT AT [facility] Provider Note   CSN: [id number] Arrival date & time: 08/10/24  1522     Patient presents with: Altered Mental Status   [name] is a 73 y.o. male with past medical history significant for hypertension, diabetes, TIA, brain tumor, narcotic abuse, CVA, alcohol dependence, CHF, UTIs, chronic pain who presents with concern for altered mental status, hypertension.  Patient found by roommate making noise, and unresponsive, initially only responsive to pain.  Fidgety, patient was snorting unknown substance but would not disclose.  Patient denies drug use.  Last known well approximately 1430 today.    Altered Mental Status      Prior to Admission medications   Medication Sig Start Date End Date Taking? Authorizing Provider  albuterol  (PROVENTIL  HFA;VENTOLIN  HFA) 108 (90 Base) MCG/ACT inhaler Inhale 1-2 puffs into the lungs every 6 (six) hours as needed for wheezing. 09/04/16   Lynwood Anes, MD  aspirin  EC 81 MG tablet Take 81 mg by mouth every morning.    [provider]  atorvastatin  (LIPITOR) 40 MG tablet Take 1 tablet (40 mg total) by mouth daily. 11/12/23   Danford, Lonni SQUIBB, MD  carvedilol  (COREG ) 25 MG tablet Take 1 tablet (25 mg total) by mouth 2 (two) times daily with a meal. 12/04/23   Bryn Bernardino NOVAK, MD  cefdinir  (OMNICEF ) 300 MG capsule Take 1 capsule (300 mg total) by mouth 2 (two) times daily. 06/12/24   Griselda Norris, MD  diclofenac  Sodium (VOLTAREN ) 1 % GEL Apply 2 g topically 4 (four) times daily. 11/12/23   Danford, Lonni SQUIBB, MD  gabapentin  (NEURONTIN ) 600 MG tablet Take 600 mg by mouth 3 (three) times daily. 12/08/23   [provider]  hydrALAZINE  (APRESOLINE ) 100 MG tablet Take 1 tablet (100 mg total) by mouth every 8 (eight) hours. 12/04/23   Bryn Bernardino NOVAK, MD  HYDROcodone -acetaminophen  (NORCO/VICODIN) 5-325 MG tablet Take 2 tablets by mouth every 4 (four) hours as needed. 03/25/24    Sponseller, Pleasant R, PA-C  ibuprofen  (ADVIL ) 800 MG tablet Take 800 mg by mouth 3 (three) times daily as needed. 12/17/23   [provider]  lidocaine  (LIDODERM ) 5 % Place 1 patch onto the skin daily. Remove & Discard patch within 12 hours or as directed by MD 01/31/24   Randol Simmonds, MD  metFORMIN  (GLUCOPHAGE ) 500 MG tablet Take 1 tablet (500 mg total) by mouth 2 (two) times daily. 11/12/23   Danford, Lonni SQUIBB, MD  nicotine  (NICODERM CQ  - DOSED IN MG/24 HOURS) 21 mg/24hr patch Place 1 patch (21 mg total) onto the skin daily. Patient taking differently: Place 21 mg onto the skin daily as needed (smoking cessation). 02/21/20   Sebastian Toribio GAILS, MD  omeprazole  (PRILOSEC) 20 MG capsule Take 1 capsule (20 mg total) by mouth daily. 12/14/22   Armenta Canning, MD  ondansetron  (ZOFRAN -ODT) 8 MG disintegrating tablet Take 1 tablet (8 mg total) by mouth every 8 (eight) hours as needed for nausea or vomiting. 08/19/22   Levander Houston, MD  tamsulosin  (FLOMAX ) 0.4 MG CAPS capsule Take 1 capsule (0.4 mg total) by mouth daily after supper. 11/12/23   Danford, Lonni SQUIBB, MD  verapamil  (CALAN -SR) 180 MG CR tablet Take 2 tablets (360 mg total) by mouth daily. 11/12/23   Danford, Lonni SQUIBB, MD    Allergies: Patient has no known allergies.    Review of Systems  All other systems reviewed and are negative.   Updated Vital Signs BP ROLLEN)  192/57   Pulse (!) 55   Temp 98 F (36.7 C) (Oral)   Resp 13   Ht 5' 9 (1.753 m)   Wt 68 kg   SpO2 100%   BMI 22.15 kg/m   Physical Exam Vitals and nursing note reviewed.  Constitutional:      General: He is not in acute distress.    Appearance: Normal appearance.     Comments: Somewhat agitated, nonsensical  HENT:     Head: Normocephalic and atraumatic.     Nose:     Comments: Nostrils are raw, no powder noted at external nares Eyes:     General:        Right eye: No discharge.        Left eye: No discharge.     Comments: Pinpoint pupils   Cardiovascular:     Rate and Rhythm: Normal rate and regular rhythm.     Heart sounds: No murmur heard.    No friction rub. No gallop.  Pulmonary:     Effort: Pulmonary effort is normal.     Breath sounds: Normal breath sounds.  Abdominal:     General: Bowel sounds are normal.     Palpations: Abdomen is soft.  Skin:    General: Skin is warm and dry.     Capillary Refill: Capillary refill takes less than 2 seconds.  Neurological:     Mental Status: He is alert. He is disoriented.  Psychiatric:        Mood and Affect: Mood normal.        Behavior: Behavior normal.     (all labs ordered are listed, but only abnormal results are displayed) Labs Reviewed  CBC - Abnormal; Notable for the following components:      Result Value   WBC 11.2 (*)    Hemoglobin 12.9 (*)    MCV 76.1 (*)    MCH 23.2 (*)    All other components within normal limits  COMPREHENSIVE METABOLIC PANEL WITH GFR - Abnormal; Notable for the following components:   Glucose, Bld 128 (*)    AST 50 (*)    All other components within normal limits  SALICYLATE LEVEL - Abnormal; Notable for the following components:   Salicylate Lvl <7.0 (*)    All other components within normal limits  ACETAMINOPHEN  LEVEL - Abnormal; Notable for the following components:   Acetaminophen  (Tylenol ), Serum <10 (*)    All other components within normal limits  ETHANOL  URINALYSIS, ROUTINE W REFLEX MICROSCOPIC  RAPID URINE DRUG SCREEN, HOSP PERFORMED    EKG: None  Radiology: DG Chest Portable 1 View Result Date: 08/10/2024 CLINICAL DATA:  Altered mental status. EXAM: PORTABLE CHEST 1 VIEW COMPARISON:  Chest radiograph June 11, 2024 FINDINGS: The heart size is borderline to mildly enlarged, stable to prior. Mediastinal contours are within normal limits. Both lungs are clear. The visualized skeletal structures are unremarkable. IMPRESSION: Borderline to mild cardiomegaly similar to prior. No active cardiopulmonary disease.  Electronically Signed   By: Megan  Zare M.D.   On: 08/10/2024 17:57   CT Head Wo Contrast Result Date: 08/10/2024 EXAM: CT HEAD WITHOUT CONTRAST 08/10/2024 04:29:51 PM TECHNIQUE: CT of the head was performed without the administration of intravenous contrast. Automated exposure control, iterative reconstruction, and/or weight based adjustment of the mA/kV was utilized to reduce the radiation dose to as low as reasonably achievable. COMPARISON: MRI head 06/12/2024 CLINICAL HISTORY: Mental status change, unknown cause. Patient arrives via [facility] for AMS with  hypertension. Patient with pinpoint pupils with EMS. Found by roommate making noise and unresponsive. Responsive to pain with ems initially. Alert to self. Fidgety, EMS noted raw nostrils. Per roommate, patient was snorting unknown substance but will not disclose. Patient endorses no use of drugs. Hx of peaked T-waves. LKW approximately 1430 today. FINDINGS: BRAIN AND VENTRICLES: No acute hemorrhage. Gray-white differentiation is preserved. No hydrocephalus. No extra-axial collection. No mass effect or midline shift. Similar focus of encephalomalacia in the anterior left frontal lobe which may reflect remote cortical infarct. Nonspecific hypoattenuation in the periventricular and subcortical white matter, most likely representing chronic small vessel disease. Mild parenchymal volume loss. ORBITS: No acute abnormality. SINUSES: No acute abnormality. SOFT TISSUES AND SKULL: No acute soft tissue abnormality. No skull fracture. IMPRESSION: 1. No acute intracranial abnormality. 2. 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 3. Chronic small vessel disease and mild parenchymal volume loss. Electronically signed by: Donnice Mania MD 08/10/2024 05:55 PM EDT RP Workstation: [id number]     Procedures   Medications Ordered in the ED  hydrALAZINE  (APRESOLINE ) tablet 50 mg (has no administration in time range)  naloxone  (NARCAN ) injection 1 mg (1 mg  Intravenous Given 08/10/24 1538)                                    Medical Decision Making  This patient is a 73 y.o. male  who presents to the ED for concern of ams.   Differential diagnoses prior to evaluation: The emergent differential diagnosis includes, but is not limited to,  CVA, seizure, hypotension, sepsis, hypoglycemia, hypoxic encephalopathy, metabolic encephalopathy, polypharmacy, substance abuse, developing dementia or alzheimers, meningitis, encephalitis, hypertensive emergency, other systemic infection, acute alcohol intoxication, acute alcohol or other drug withdrawal or psychiatric manifestation vs other . This is not an exhaustive differential.   Past Medical History / Co-morbidities / Social History: hypertension, diabetes, TIA, brain tumor, narcotic abuse, CVA, alcohol dependence, CHF, UTIs, chronic pain  Additional history: Chart reviewed. Pertinent results include: Reviewed lab work, imaging from previous emergency department visits  Physical Exam: Physical exam performed. The pertinent findings include: Initially disoriented, poorly cooperative, after Narcan  1 mg pupils returned to normal size, patient is alert and oriented, cooperative.  Lab Tests/Imaging studies: I personally interpreted labs/imaging and the pertinent results include: CBC notable for mild leukocytosis, blood cells 11.2, mild anemia, hemoglobin 12.9, otherwise overall unremarkable.  CMP overall unremarkable other than very mildly elevated AST at 50.  Negative ethanol, salicylate, acetaminophen  levels.  I independently interpreted CT head, plain, chest x-ray which shows no evidence of acute intracranial or intrathoracic abnormality. I agree with the radiologist interpretation.  Cardiac monitoring: EKG obtained and interpreted by myself and attending physician which shows: Normal sinus rhythm, left ventricular hypertrophy, borderline prolonged QT   Medications: I ordered medication including narcan   1mg .  I have reviewed the patients home medicines and have made adjustments as needed.   Disposition: After consideration of the diagnostic results and the patients response to treatment, I feel that patient return to normal consciousness, alert and oriented x 4, pinpoint pupils have resolved, patient stable for discharge at this time.   emergency department workup does not suggest an emergent condition requiring admission or immediate intervention beyond what has been performed at this time. The plan is: as above. The patient is safe for discharge and has been instructed to return immediately for worsening symptoms, change in symptoms or  any other concerns.   Final diagnoses:  Transient alteration of awareness    ED Discharge Orders     None          Rosan Sherlean DEL, PA-C 08/10/24 1810    Neysa Caron PARAS, DO 08/11/24 0703

## 2024-08-10 NOTE — Discharge Instructions (Addendum)
 Recommend refraining from any drug use in the future, please return if you continue to have any confusion or other concerns.  Make sure that you are taking your home blood pressure medication.  Please return if you have chest pain, shortness of breath, fever, chills.

## 2024-08-22 ENCOUNTER — Emergency Department (HOSPITAL_COMMUNITY)
Admission: EM | Admit: 2024-08-22 | Discharge: 2024-08-22 | Disposition: A | Discharge status: Home / Self Care | Attending: Emergency Medicine | Admitting: Emergency Medicine

## 2024-08-22 ENCOUNTER — Other Ambulatory Visit: Payer: Self-pay

## 2024-08-22 ENCOUNTER — Encounter (HOSPITAL_COMMUNITY): Payer: Self-pay

## 2024-08-22 DIAGNOSIS — T402X1A Poisoning by other opioids, accidental (unintentional), initial encounter: Secondary | ICD-10-CM | POA: Insufficient documentation

## 2024-08-22 DIAGNOSIS — Z7982 Long term (current) use of aspirin: Secondary | ICD-10-CM | POA: Insufficient documentation

## 2024-08-22 DIAGNOSIS — T40601A Poisoning by unspecified narcotics, accidental (unintentional), initial encounter: Secondary | ICD-10-CM

## 2024-08-22 LAB — SALICYLATE LEVEL: Salicylate Lvl: <7 mg/dL — ABNORMAL LOW (ref 7.0–30.0)

## 2024-08-22 LAB — CBC WITH DIFFERENTIAL/PLATELET
Abs Immature Granulocytes: 0.06 K/uL (ref 0.00–0.07)
Basophils Absolute: 0.1 K/uL (ref 0.0–0.1)
Basophils Relative: 0 %
Eosinophils Absolute: 0.1 K/uL (ref 0.0–0.5)
Eosinophils Relative: 1 %
HCT: 40.4 % (ref 39.0–52.0)
Hemoglobin: 12.1 g/dL — ABNORMAL LOW (ref 13.0–17.0)
Immature Granulocytes: 1 %
Lymphocytes Relative: 15 %
Lymphs Abs: 1.8 K/uL (ref 0.7–4.0)
MCH: 23.2 pg — ABNORMAL LOW (ref 26.0–34.0)
MCHC: 30 g/dL (ref 30.0–36.0)
MCV: 77.5 fL — ABNORMAL LOW (ref 80.0–100.0)
Monocytes Absolute: 0.7 K/uL (ref 0.1–1.0)
Monocytes Relative: 5 %
Neutro Abs: 9.3 K/uL — ABNORMAL HIGH (ref 1.7–7.7)
Neutrophils Relative %: 78 %
Platelets: 302 K/uL (ref 150–400)
RBC: 5.21 MIL/uL (ref 4.22–5.81)
RDW: 15.7 % — ABNORMAL HIGH (ref 11.5–15.5)
WBC: 12 K/uL — ABNORMAL HIGH (ref 4.0–10.5)
nRBC: 0 % (ref 0.0–0.2)

## 2024-08-22 LAB — COMPREHENSIVE METABOLIC PANEL WITH GFR
ALT: 27 U/L (ref 0–44)
AST: 44 U/L — ABNORMAL HIGH (ref 15–41)
Albumin: 3.5 g/dL (ref 3.5–5.0)
Alkaline Phosphatase: 52 U/L (ref 38–126)
Anion gap: 11 (ref 5–15)
BUN: 21 mg/dL (ref 8–23)
CO2: 18 mmol/L — ABNORMAL LOW (ref 22–32)
Calcium: 9.1 mg/dL (ref 8.9–10.3)
Chloride: 110 mmol/L (ref 98–111)
Creatinine, Ser: 0.9 mg/dL (ref 0.61–1.24)
GFR, Estimated: >60 mL/min (ref >60)
Glucose, Bld: 165 mg/dL — ABNORMAL HIGH (ref 70–99)
Potassium: 4 mmol/L (ref 3.5–5.1)
Sodium: 139 mmol/L (ref 135–145)
Total Bilirubin: 0.3 mg/dL (ref 0.0–1.2)
Total Protein: 7.1 g/dL (ref 6.5–8.1)

## 2024-08-22 LAB — ACETAMINOPHEN LEVEL: Acetaminophen (Tylenol), Serum: <10 ug/mL — ABNORMAL LOW (ref 10–30)

## 2024-08-22 LAB — CBG MONITORING, ED: Glucose-Capillary: 110 mg/dL — ABNORMAL HIGH (ref 70–99)

## 2024-08-22 LAB — ETHANOL: Alcohol, Ethyl (B): <15 mg/dL (ref <15)

## 2024-08-22 MED ORDER — LIDOCAINE 5 % EX PTCH
1.0000 | MEDICATED_PATCH | CUTANEOUS | Status: DC
Start: 2024-08-22 — End: 2024-08-23
  Administered 2024-08-22: 1 via TRANSDERMAL
  Filled 2024-08-22: qty 1
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
## 2024-08-22 NOTE — ED Triage Notes (Incomplete)
 Initially called for cardiac arrest, family did CPR because he was unconscious unknown if pulse was gone   1mg  IN narcan   GCS 11 initially, 15 after narcan    Hydrocone prescription tody for30 pills, only had 9 left. Pt denies taking pills.

## 2024-08-22 NOTE — Discharge Instructions (Addendum)
 No concern findings on your workup.  Please be careful with your opioid medication.  Return for any concerning symptoms.

## 2024-08-22 NOTE — ED Notes (Signed)
 PT D/C'D AFTER INSTRUCTIONS REVIEWED. PT VERBALIZED UNDERSTANDING. NAD REPORTED OR NOTED AT THIS TIME.
